# Patient Record
Sex: Male | Born: 1955 | Race: White | Hispanic: No | Marital: Married | Smoking: Never smoker
Health system: Southern US, Community
[De-identification: ages and names within clinical notes are randomized; demographics above are authoritative.]

## PROBLEM LIST (undated history)

## (undated) DIAGNOSIS — R570 Cardiogenic shock: Secondary | ICD-10-CM

## (undated) DIAGNOSIS — M2550 Pain in unspecified joint: Secondary | ICD-10-CM

## (undated) DIAGNOSIS — H538 Other visual disturbances: Secondary | ICD-10-CM

## (undated) DIAGNOSIS — C140 Malignant neoplasm of pharynx, unspecified: Secondary | ICD-10-CM

## (undated) DIAGNOSIS — E875 Hyperkalemia: Secondary | ICD-10-CM

## (undated) DIAGNOSIS — R931 Abnormal findings on diagnostic imaging of heart and coronary circulation: Secondary | ICD-10-CM

## (undated) DIAGNOSIS — R0602 Shortness of breath: Secondary | ICD-10-CM

## (undated) DIAGNOSIS — E669 Obesity, unspecified: Secondary | ICD-10-CM

## (undated) DIAGNOSIS — N289 Disorder of kidney and ureter, unspecified: Secondary | ICD-10-CM

## (undated) DIAGNOSIS — Z8673 Personal history of transient ischemic attack (TIA), and cerebral infarction without residual deficits: Secondary | ICD-10-CM

## (undated) DIAGNOSIS — R0989 Other specified symptoms and signs involving the circulatory and respiratory systems: Secondary | ICD-10-CM

## (undated) DIAGNOSIS — I509 Heart failure, unspecified: Secondary | ICD-10-CM

## (undated) DIAGNOSIS — E039 Hypothyroidism, unspecified: Secondary | ICD-10-CM

## (undated) DIAGNOSIS — E785 Hyperlipidemia, unspecified: Secondary | ICD-10-CM

## (undated) DIAGNOSIS — M2540 Effusion, unspecified joint: Secondary | ICD-10-CM

## (undated) DIAGNOSIS — I10 Essential (primary) hypertension: Secondary | ICD-10-CM

## (undated) DIAGNOSIS — I639 Cerebral infarction, unspecified: Secondary | ICD-10-CM

## (undated) HISTORY — PX: CARDIAC CATHETERIZATION: SHX172

## (undated) HISTORY — DX: Heart failure, unspecified: I50.9

## (undated) HISTORY — DX: Essential (primary) hypertension: I10

## (undated) HISTORY — DX: Other visual disturbances: H53.8

## (undated) HISTORY — DX: Abnormal findings on diagnostic imaging of heart and coronary circulation: R93.1

## (undated) HISTORY — DX: Personal history of transient ischemic attack (TIA), and cerebral infarction without residual deficits: Z86.73

## (undated) HISTORY — DX: Obesity, unspecified: E66.9

## (undated) HISTORY — DX: Cardiogenic shock: R57.0

## (undated) HISTORY — DX: Other specified symptoms and signs involving the circulatory and respiratory systems: R09.89

## (undated) HISTORY — DX: Effusion, unspecified joint: M25.40

## (undated) HISTORY — DX: Hyperkalemia: E87.5

## (undated) HISTORY — DX: Pain in unspecified joint: M25.50

## (undated) HISTORY — DX: Cerebral infarction, unspecified: I63.9

## (undated) HISTORY — DX: Disorder of kidney and ureter, unspecified: N28.9

## (undated) HISTORY — DX: Shortness of breath: R06.02

---

## 2008-08-06 ENCOUNTER — Encounter: Payer: Self-pay | Admitting: Cardiovascular Disease

## 2008-08-06 ENCOUNTER — Inpatient Hospital Stay (HOSPITAL_COMMUNITY): Admission: EM | Admit: 2008-08-06 | Discharge: 2008-08-12 | Payer: Self-pay | Admitting: Emergency Medicine

## 2008-08-06 ENCOUNTER — Ambulatory Visit: Payer: Self-pay | Admitting: Internal Medicine

## 2009-09-02 DIAGNOSIS — C140 Malignant neoplasm of pharynx, unspecified: Secondary | ICD-10-CM

## 2009-09-02 HISTORY — PX: NECK DISSECTION: SUR422

## 2009-09-02 HISTORY — DX: Malignant neoplasm of pharynx, unspecified: C14.0

## 2011-01-15 NOTE — Discharge Summary (Signed)
[name], [name] NO.:  [id number]   MEDICAL RECORD NO.:  [id number]          PATIENT TYPE:  INP   LOCATION:  4703                         FACILITY:  [facility]   PHYSICIAN:  Vesta Mixer, M.D. DATE OF BIRTH:  1956-05-02   DATE OF ADMISSION:  08/06/2008  DATE OF DISCHARGE:  08/12/2008                               DISCHARGE SUMMARY   DISCHARGE DIAGNOSES:  1. Severe systolic congestive heart failure with an ejection fraction      around 10%.  2. Cardiogenic shock.  3. Profound dehydration with hyperkalemia.  4. Transient renal insufficiency.  5. Nonsustained ventricular tachycardia.  6. Diabetes mellitus.  7. Smooth and normal coronary arteries.   DISCHARGE MEDICATIONS:  1. Byetta 10 mcg twice a day.  2. Hydrochlorothiazide 25 mg a day.  3. Lisinopril 5 mg a day.  4. Carvedilol 12.5 mg twice a day.  5. Metformin 500 mg twice a day.   DISPOSITION:  The patient will need to follow up with Dr. Darlyn Read.  We  have changed some of his diabetic medicines because of his renal failure  and dehydration.  He will follow up with Dr. Elease Hashimoto in 1-2 weeks.   The patient will go home with a LifeVest.  This will be fit, and he will  be instructed in its use prior to discharge.  He will follow up with Dr.  Darlyn Read as noted above.   HISTORY:  [name] is a 55 year old gentleman who was admitted to  the hospital as an unassigned patient.  He presented to the [facility]  Emergency Room with a systolic blood pressure of 60 and profound  hyperkalemia.  Please see dictated H and P for further details.   HOSPITAL COURSE BY PROBLEMS:  1. Hypotension.  The patient was found to be in shock.  This turned      out to be a combination of hypovolemia and a markedly reduced left      ventricular systolic function.  The patient had been recently      diagnosed with congestive heart failure and had been started on      aggressive medical therapy including Lasix.  He presented with  a      systolic blood pressure of 60.  A stat echocardiogram performed in      the ER revealed a markedly reduced left ventricular systolic      function with an ejection fraction of around 10%.  He was also      found to have a potassium of 6.0.  The patient was given volume      repletion.  He received about 3 liters in the first 6 hours which      gradually got his pressure up to around 80.  We started him on a      dopamine drip which eventually was able to get his blood pressure      up above 90 and later 100.  The dopamine was continued through the      night and was then titrated off the following day.  Following this,  the patient did not have any further episodes of hypotension after      he became well hydrated.   1. Congestive heart failure.  The patient was recently diagnosed with      congestive heart failure by Dr. Darlyn Read.  He was started on Lasix,      potassium, and ACE inhibitor.  He showed up in the emergency room      with marked hypotension and hyperkalemia.  We discontinued these      medications and hydrated him well.  When he got back to baseline      creatinine, we were able to do a heart catheterization, which      revealed minimal coronary artery irregularities.  He had a markedly      reduced left ventricular systolic function with an EF of around      10%.   The patient was started on low-dose carvedilol and we gradually added an  low-dose lisinopril.  The patient had several episodes of nonsustained  ventricular tachycardia.  Electrophysiology was consulted and they have  arranged for the patient to have a LifeVest.   We will continue with aggressive medical therapy, and we will gradually  titrate up his carvedilol and lisinopril.  We may also add  spironolactone, but we will need to be very careful since he presented  with a renal failure and hyperkalemia.  We will check his ejection  fraction in the office in approximately 3 months.  If the patient's  EF  is still low, then he will need to be referred for ICD.   1. Renal insufficiency.  The patient was admitted with a creatinine of      2.6.  This turned out to be mostly due to volume depletion.  Came      down to about 1.3 with hydration.  Upon discharge, his creatinine      is 1.41.  This will need to be followed as an outpatient.   1. Diabetes mellitus.  We continued the Byetta during the      hospitalization.  We stopped his Glucophage since he had renal      failure.  We will restart it upon discharge and this will need to      be followed by Dr. Darlyn Read.   1. Hypertension.  We have started the patient on hydrochlorothiazide      25 mg a day, lisinopril 5 mg a day, and carvedilol 12.5 mg twice a      day.  This will be followed by Dr. Elease Hashimoto.  All of his other      medical problems remained stable.      Vesta Mixer, M.D.  Electronically Signed     PJN/MEDQ  D:  08/12/2008  T:  08/13/2008  Job:  [id number]   cc:   Mechele Claude, MD

## 2011-01-15 NOTE — Cardiovascular Report (Signed)
[name], [name] NO.:  [id number]   MEDICAL RECORD NO.:  [id number]          PATIENT TYPE:  INP   LOCATION:  4703                         FACILITY:  [facility]   PHYSICIAN:  Vesta Mixer, M.D. DATE OF BIRTH:  July 27, 1956   DATE OF PROCEDURE:  08/09/2008  DATE OF DISCHARGE:                            CARDIAC CATHETERIZATION   Brian Hall is a middle-aged gentleman with a recent diagnosis of  congestive heart failure.  He was admitted to the hospital on August 06, 2008, with profound hypotension.  He was found to have renal  insufficiency.  He was hydrated.  Echocardiogram done emergently in the  emergency room reveals that he had markedly reduced left ventricular  systolic function.  After adequate hydration and recovery of his renal  function, he is referred for heart catheterization.   PROCEDURE:  Right and left heart catheterization.   The right femoral artery and right femoral vein were easily cannulated  using a modified Seldinger technique.   HEMODYNAMICS:  The RA pressure was 14/11 with a mean of 8.  His RV  pressure was 54/5 with a mean of 15.   We were unable to advance the Swan-Ganz catheter any further out into  the RV.  We tried multiple Swan wires.  I did not want to try any  stiffer catheter.   We drew saturations in the RV and did not attempt to get a wedge  pressure.   His LV pressure is 112/10 with an aortic pressure of 102/51.   His saturations, RV saturation is 61%.  His RFA saturation is 97%.  His  Fick cardiac output is 4.48 liters a minute.  His index is 1.91 liters a  minute.   Angiography of left main.  The left main is fairly smooth and normal.   The left anterior descending artery is fairly large vessel.  It is  fairly smooth.  It gives off a large diagonal vessel, which is also  normal.  There is a large ramus intermediate branch, which is smooth and  normal.   The left circumflex artery is moderate in size.  It does  not give off  any marginal vessels but gives off several posterolateral branches, all  which are normal.   The right coronary artery is large and dominant.  It is smooth and  normal in the proximal mid segments.  There is a distal area of  calcification just prior to the bifurcation into the posterolateral  branch and the posterior descending artery.  This calcification is  associated with a 20-30% stenosis, but it certainly is not flow  limiting.   The left ventriculogram was performed in a 30 RAO position.  It reveals  moderate left ventricular enlargement.  There is marked reduction of  left ventricular systolic function.  His ejection fraction is around  15%.   COMPLICATIONS:  None.   CONCLUSION:  1. Minimal coronary artery disease.  2. Severe left ventricular dysfunction.  We will start beta-blocker as      tolerated.  We will restart his ACE inhibitor as tolerated.  We  will get an EP consult.  The patient has had some nonsustained      ventricular tachycardia and certainly is at high risk of having      sudden cardiac death.      Vesta Mixer, M.D.  Electronically Signed     PJN/MEDQ  D:  08/09/2008  T:  08/10/2008  Job:  [id number]   cc:   Joycelyn Man. Darlyn Read, MD

## 2011-01-15 NOTE — H&P (Signed)
NAMEDONTEL, HARSHBERGER NO.:  [id number]   MEDICAL RECORD NO.:  [id number]          PATIENT TYPE:  INP   LOCATION:  2904                         FACILITY:  [facility]   PHYSICIAN:  Vesta Mixer, M.D. DATE OF BIRTH:  01/12/1956   DATE OF ADMISSION:  08/06/2008  DATE OF DISCHARGE:                              HISTORY & PHYSICAL   Brian Hall is a 55 year old gentleman who presented to the emergency  room with an episode of profound hypertension and weakness.  We were  asked to see him today by Dr. Preston Fleeting.   Brian Hall is a 55 year old gentleman with a longstanding history of  diabetes mellitus and hypertension.  He was diagnosed with congestive  heart failure several weeks ago.  He normally sees Dr. Darlyn Read.  The  patient presents with a 106-month history of progressive shortness of  breath.  He does describe a viral illness that started about 3 months  ago, was sick for about a month.  He denies any recent cough or cold  symptoms.  He denies any fever.  He has been short of breath for the  past several months.  He recently saw Dr. Darlyn Read and was found to have  congestive heart failure with an elevated B natriuretic peptide be of  around 1500.  He was started on Lasix 40 mg a day, potassium chloride 20  mEq a day, and lisinopril 20 mg a day.  He did well for about a week or  so, but this morning developed episodes of generalized weakness and  presyncope.  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  The patient felt quite poorly and presented to the [facility] Emergency Room for further evaluation.   The patient is quite dizzy and feels quite weak.  Immediately after he  started these medications, he felt well for several days and in fact he  went hunting several days ago and states that he could keep up with the  others without any problems.  It has been over the past couple of days  he is being progressively weaker and then today  he is the weakest in  recent history.   He denies any true syncope.  He denies any recent PND, orthopnea,  although he did have some PND and orthopnea a month ago.  He had a 30-  pound weight loss recently due to the diuresis.  He denies any chest  pain.  He has not had any episodes of angina.  He does not get any  regular exercise.   CURRENT MEDICATIONS:  1. Lasix 40 mg a day.  2. Potassium chloride 20 mEq a day.  3. Metformin 1000 mg a day.  4. Lisinopril 20 mg a day.  5. Byetta 10 mcg subcu twice a day.  6. The patient has a bottle of Zocor, but has not been taken.  He has      no known drug allergies.   PAST MEDICAL HISTORY:  1. Hypertension - longstanding.  2. History of diabetes mellitus.  3. History of obesity.  The patient is used to weigh greater than 350      pounds.  He now weighs of around 250.   He gained 30 pounds of weight that he thought was fluid.  When he  presented to see Dr. Darlyn Read, he was started on medications and loss 30  extra pounds within few days' time.   SOCIAL HISTORY:  The patient is a nonsmoker and nondrinker.  He moves  heavy equipment.   FAMILY HISTORY:  His father had a long history of hypertension and renal  failure.  His father was a long-time smoker.   REVIEW OF SYSTEMS:  The patient admits to having a viral illness 3  months ago as noted above.  He denies any recent cough or fever.  He has  had some weight loss due to starting the Lasix over the past couple of  years and he has also had a profound weight loss since last year.  He  was recently diagnosed, having congestive heart failure and he has had  some shortness of breath.  He denies any angina.  He denies any recent  cough or sputum production.  He denies any change of his appetite.  He  denies any visual changes.  He denies any neurologic changes.  Denies  any blood in his stool or blood in his urine.  He denies any previous  aches and pains, but he states it is quite achy and  weak today.  He  denies any trouble of walking.  Review of systems was reviewed and is  otherwise negative.   PHYSICAL EXAMINATION:  GENERAL:  He is a middle-aged gentleman in no  acute distress.  VITAL SIGNS:  His blood pressure is 75/60.  He has received 2 liters of  normal saline.  NECK:  He has mild elevation of his JVD.  His carotids are 2+ and  without bruits.  HEENT:  His sclerae are nonicteric.  His mucous membranes are fairly  moist now.  He is normocephalic and atraumatic.  LUNG:  Clear.  HEART:  Regular rate.  S1, S2.  I did not hear a gallop.  His PMI was  nondisplaced.  ABDOMEN:  Obese.  There is no areas of tenderness and no masses or  bruits.  EXTREMITIES:  He has no clubbing, cyanosis, or edema.  He has no rash.  His gait was not assessed.  NEUROLOGIC:  His cranial nerves are grossly intact and his motor and  sensory function are intact.   LABORATORY:  Sodium is now 138.  His potassium is 6.0.  We obtained a  potassium of 9.0 early but this was slightly hemolyzed.  His chloride is  110, CO2 is 24, BUN is 44, creatinine started at 2.3 and has down to 2.1  after hydration.  His glucose is 206.  His B natriuretic peptide is 437.  His troponin is less than 0.05.  His white blood cell count IS 13.7 with  a hemoglobin of 15.5.   His chest x-ray reveals moderate-to-marked cardiomegaly.  There is no  significant evidence of pulmonary edema.   His echocardiogram which was performed emergently in the ER reveals  markedly reduced left ventricular systolic function.  His ejection  fraction is around 15%.  He has a trivial pericardial effusion which is  only just a little bit more than what would be physiologic.  He has  bilateral atrial enlargement.   IMPRESSION AND PLAN:  1. Hypertension.  The patient is markedly hypotensive.  I  think he is      very dehydrated.  He spoke with his medical doctor and his renal      insufficiency is new.  I suspect that it is because of  the Lasix      and ACE inhibitor.  He has already received 2-1/2 liters of saline      and his blood pressure is up about 75.  We will continue very slow      and gentle hydration since his left ventricle function is so poor.      Once if we get him well hydrated, we may need to started him on      some low-dose dopamine, which we initiated in the ER.  We will      continue to watch him in the intensive care unit.  2. Hyperkalemia.  The patient's potassium is 6.0.  He is on a      potassium supplement and has recently been placed on lisinopril.      We have given him some Kayexalate and we will check his BNP in a      couple of hours.  I suspect that this will resolve fairly easily.  3. Diabetes mellitus.  His glucose levels are fairly elevated today.      We will have to hold his Glucophage since he has got renal      dysfunction.  We will check his Accu-Cheks and give him a sliding      scale as needed.  4. Renal insufficiency.  His creatinine is now 2.1.  This is brand new      problem.  I suspect it is due to volume depletion.  Hopefully, this      will improve significantly.   We will anticipate doing a heart catheterization later in the week after  he stabilize some.  1. Hyperlipidemia.  The patient has a history of hyperlipidemia.  We      will follow this with some labs in the morning.      Vesta Mixer, M.D.  Electronically Signed     PJN/MEDQ  D:  08/06/2008  T:  08/07/2008  Job:  [id number]   cc:   Joycelyn Man. Stacks, M.D.

## 2011-01-15 NOTE — Consult Note (Signed)
Brian Hall, ALBANESE NO.:  [id number]   MEDICAL RECORD NO.:  [id number]          PATIENT TYPE:  INP   LOCATION:  4703                         FACILITY:  [facility]   PHYSICIAN:  Doylene Canning. Ladona Ridgel, MD    DATE OF BIRTH:  09/11/1955   DATE OF CONSULTATION:  08/09/2008  DATE OF DISCHARGE:                                 CONSULTATION   REQUESTING PHYSICIAN:  Vesta Mixer, MD   INDICATIONS FOR CONSULTATION:  Evaluation of nonsustained VT in a  patient with a nonischemic cardiomyopathy and congestive heart failure.   HISTORY OF PRESENT ILLNESS:  The patient is a very pleasant middle-aged  male with a history of hypertension and diabetes.  He has acute renal  insufficiency.  He was admitted to the hospital with acute congestive  heart failure and some dehydration.  His BNP was in the 1500 range.  The  patient actually was admitted with weakness and presyncope.  His heart  failure symptoms had occurred a week or two before his symptoms.  In  reflection, he notes that in fact for the last 4 months, he has felt  poorly, had less energy, more shortness of breath, he would have to stop  and rest, he would wake up at night and be short of breath and after sit  up by the side of the bed.  He tried to tug off these symptoms, as he  describes for some time and he has presented to the hospital with  worsening heart failure symptoms.  He has now been treated with IV  diuresis and has undergone catheterization, which demonstrates no  obstructive coronary artery disease.  He has severe LV dysfunction.  The  EF is 15%.  His baseline QRS in the heart failure was 117 milliseconds.   PHYSICAL EXAMINATION:  GENERAL:  He is a pleasant, obese, middle-aged  man in no acute distress.  VITAL SIGNS:  Blood pressure was 105/70, pulse was 85 and regular,  respirations were 18.  HEENT:  Normocephalic, atraumatic.  Pupils equal and round.  Oropharynx  is moist.  Sclerae were anicteric.  NECK:  A  7- to 8-cm jugular venous distention.  There is no thyromegaly.  Trachea is midline.  Carotids are 2+ and symmetric.  LUNGS:  Clear bilaterally to auscultation.  No wheezes, rales, or  rhonchi are present except for basilar rales bilaterally.  CARDIOVASCULAR:  Regular rate and rhythm.  Normal S1 and S2.  ABDOMEN:  Soft, nontender, and nondistended.  There is no organomegaly.  Bowel sounds are present.  There is no rebound or guarding.  EXTREMITIES:  No cyanosis, clubbing, or edema.  The pulses were 2+ and  symmetric.  NEUROLOGIC:  Alert and oriented x3.  Cranial nerves are intact.  Strength is 5/5 and symmetric.   IMPRESSION:  1. Acute nonischemic cardiomyopathy.  2. Congestive heart failure class II, at best may be worse.  3. Hypertension.  4. Obesity.  5. Dyslipidemia.   DISCUSSION:  Mr. Virgin is stable, and his catheterization  demonstrates no obstructive coronary artery disease.  He has not been on  maximal  medical therapy with regard to heart failure medications, and  for this reason, I have recommended that he do so.  With regard to  defibrillator insertion, the patient is a candidate for prophylactic ICD  insertion; however, he has not been treated and diagnosed heart failure  for 3 months, and at this point, I will consider a LifeVest for the  patient.  We will also need to up titrate his medical therapy.  After 3  months if his LV function still remains down, prophylactic defibrillator  insertion will be recommended.      Doylene Canning. Ladona Ridgel, MD  Electronically Signed     GWT/MEDQ  D:  08/09/2008  T:  08/10/2008  Job:  [id number]

## 2011-06-07 LAB — DIFFERENTIAL
Lymphs Abs: 1.5 10*3/uL (ref 0.7–4.0)
Monocytes Absolute: 0.5 10*3/uL (ref 0.1–1.0)
Monocytes Relative: 4 % (ref 3–12)
Neutro Abs: 11.6 10*3/uL — ABNORMAL HIGH (ref 1.7–7.7)
Neutrophils Relative %: 85 % — ABNORMAL HIGH (ref 43–77)

## 2011-06-07 LAB — B-NATRIURETIC PEPTIDE (CONVERTED LAB)
Pro B Natriuretic peptide (BNP): 1215 pg/mL — ABNORMAL HIGH (ref 0.0–100.0)
Pro B Natriuretic peptide (BNP): 1334 pg/mL — ABNORMAL HIGH (ref 0.0–100.0)
Pro B Natriuretic peptide (BNP): 1832 pg/mL — ABNORMAL HIGH (ref 0.0–100.0)
Pro B Natriuretic peptide (BNP): 785 pg/mL — ABNORMAL HIGH (ref 0.0–100.0)

## 2011-06-07 LAB — BASIC METABOLIC PANEL
BUN: 23 mg/dL (ref 6–23)
BUN: 27 mg/dL — ABNORMAL HIGH (ref 6–23)
CO2: 21 mEq/L (ref 19–32)
CO2: 29 mEq/L (ref 19–32)
Calcium: 8.2 mg/dL — ABNORMAL LOW (ref 8.4–10.5)
Calcium: 8.6 mg/dL (ref 8.4–10.5)
Calcium: 8.7 mg/dL (ref 8.4–10.5)
Calcium: 8.9 mg/dL (ref 8.4–10.5)
Chloride: 102 mEq/L (ref 96–112)
Chloride: 103 mEq/L (ref 96–112)
Chloride: 107 mEq/L (ref 96–112)
Chloride: 108 mEq/L (ref 96–112)
Creatinine, Ser: 1.04 mg/dL (ref 0.4–1.5)
Creatinine, Ser: 1.16 mg/dL (ref 0.4–1.5)
Creatinine, Ser: 1.3 mg/dL (ref 0.4–1.5)
Creatinine, Ser: 1.41 mg/dL (ref 0.4–1.5)
Creatinine, Ser: 1.71 mg/dL — ABNORMAL HIGH (ref 0.4–1.5)
GFR calc Af Amer: 43 mL/min — ABNORMAL LOW (ref >60)
GFR calc Af Amer: 52 mL/min — ABNORMAL LOW (ref >60)
GFR calc Af Amer: >60 mL/min (ref >60)
GFR calc Af Amer: >60 mL/min (ref >60)
GFR calc Af Amer: >60 mL/min (ref >60)
GFR calc Af Amer: >60 mL/min (ref >60)
GFR calc non Af Amer: 58 mL/min — ABNORMAL LOW (ref >60)
GFR calc non Af Amer: >60 mL/min (ref >60)
GFR calc non Af Amer: >60 mL/min (ref >60)
Glucose, Bld: 101 mg/dL — ABNORMAL HIGH (ref 70–99)
Glucose, Bld: 118 mg/dL — ABNORMAL HIGH (ref 70–99)
Potassium: 4.5 mEq/L (ref 3.5–5.1)
Potassium: 4.8 mEq/L (ref 3.5–5.1)
Potassium: 5.8 mEq/L — ABNORMAL HIGH (ref 3.5–5.1)
Sodium: 131 mEq/L — ABNORMAL LOW (ref 135–145)
Sodium: 138 mEq/L (ref 135–145)
Sodium: 139 mEq/L (ref 135–145)
Sodium: 141 mEq/L (ref 135–145)

## 2011-06-07 LAB — POCT I-STAT 3, VENOUS BLOOD GAS (G3P V)
Acid-Base Excess: 1 mmol/L (ref 0.0–2.0)
Bicarbonate: 25.2 mEq/L — ABNORMAL HIGH (ref 20.0–24.0)
TCO2: 26 mmol/L (ref 0–100)

## 2011-06-07 LAB — PROTIME-INR
INR: 1 (ref 0.00–1.49)
Prothrombin Time: 13.8 seconds (ref 11.6–15.2)

## 2011-06-07 LAB — CARDIAC PANEL(CRET KIN+CKTOT+MB+TROPI)
CK, MB: 3.1 ng/mL (ref 0.3–4.0)
Total CK: 69 U/L (ref 7–232)
Troponin I: 1.15 ng/mL (ref 0.00–0.06)
Troponin I: 1.15 ng/mL (ref 0.00–0.06)

## 2011-06-07 LAB — LIPID PANEL
Triglycerides: 160 mg/dL — ABNORMAL HIGH (ref <150)
VLDL: 32 mg/dL (ref 0–40)

## 2011-06-07 LAB — POCT CARDIAC MARKERS
CKMB, poc: 2.1 ng/mL (ref 1.0–8.0)
Myoglobin, poc: 186 ng/mL (ref 12–200)
Troponin i, poc: <0.05 ng/mL (ref 0.00–0.09)

## 2011-06-07 LAB — GLUCOSE, CAPILLARY
Glucose-Capillary: 112 mg/dL — ABNORMAL HIGH (ref 70–99)
Glucose-Capillary: 114 mg/dL — ABNORMAL HIGH (ref 70–99)
Glucose-Capillary: 115 mg/dL — ABNORMAL HIGH (ref 70–99)
Glucose-Capillary: 122 mg/dL — ABNORMAL HIGH (ref 70–99)
Glucose-Capillary: 123 mg/dL — ABNORMAL HIGH (ref 70–99)
Glucose-Capillary: 129 mg/dL — ABNORMAL HIGH (ref 70–99)
Glucose-Capillary: 131 mg/dL — ABNORMAL HIGH (ref 70–99)
Glucose-Capillary: 133 mg/dL — ABNORMAL HIGH (ref 70–99)
Glucose-Capillary: 134 mg/dL — ABNORMAL HIGH (ref 70–99)
Glucose-Capillary: 174 mg/dL — ABNORMAL HIGH (ref 70–99)
Glucose-Capillary: 249 mg/dL — ABNORMAL HIGH (ref 70–99)

## 2011-06-07 LAB — CBC
HCT: 43.7 % (ref 39.0–52.0)
Hemoglobin: 15.8 g/dL (ref 13.0–17.0)
MCV: 89.7 fL (ref 78.0–100.0)
Platelets: 160 10*3/uL (ref 150–400)
RBC: 4.83 MIL/uL (ref 4.22–5.81)
RBC: 5.42 MIL/uL (ref 4.22–5.81)
WBC: 13.7 10*3/uL — ABNORMAL HIGH (ref 4.0–10.5)
WBC: 8.6 10*3/uL (ref 4.0–10.5)

## 2011-06-07 LAB — POCT I-STAT 3, ART BLOOD GAS (G3+)
Acid-base deficit: 1 mmol/L (ref 0.0–2.0)
O2 Saturation: 97 %

## 2011-06-07 LAB — CK TOTAL AND CKMB (NOT AT ARMC): Total CK: 193 U/L (ref 7–232)

## 2011-06-07 LAB — POCT I-STAT, CHEM 8
Calcium, Ion: 1.01 mmol/L — ABNORMAL LOW (ref 1.12–1.32)
HCT: 49 % (ref 39.0–52.0)
Hemoglobin: 16.7 g/dL (ref 13.0–17.0)
Sodium: 138 mEq/L (ref 135–145)
TCO2: 24 mmol/L (ref 0–100)

## 2011-06-07 LAB — LACTIC ACID, PLASMA: Lactic Acid, Venous: 4.7 mmol/L — ABNORMAL HIGH (ref 0.5–2.2)

## 2011-06-07 LAB — TROPONIN I: Troponin I: 1.59 ng/mL (ref 0.00–0.06)

## 2011-06-07 LAB — POTASSIUM: Potassium: 5.8 mEq/L — ABNORMAL HIGH (ref 3.5–5.1)

## 2014-09-12 ENCOUNTER — Encounter (HOSPITAL_COMMUNITY): Payer: Self-pay

## 2014-09-12 ENCOUNTER — Inpatient Hospital Stay (HOSPITAL_COMMUNITY)
Admission: EM | Admit: 2014-09-12 | Discharge: 2014-09-16 | DRG: 065 | Disposition: A | Discharge status: Rehabilitation Facility | Payer: 59 | Attending: Internal Medicine | Admitting: Internal Medicine

## 2014-09-12 ENCOUNTER — Emergency Department (HOSPITAL_COMMUNITY): Payer: 59

## 2014-09-12 DIAGNOSIS — Z7982 Long term (current) use of aspirin: Secondary | ICD-10-CM

## 2014-09-12 DIAGNOSIS — N179 Acute kidney failure, unspecified: Secondary | ICD-10-CM | POA: Diagnosis present

## 2014-09-12 DIAGNOSIS — Z85819 Personal history of malignant neoplasm of unspecified site of lip, oral cavity, and pharynx: Secondary | ICD-10-CM

## 2014-09-12 DIAGNOSIS — E1142 Type 2 diabetes mellitus with diabetic polyneuropathy: Secondary | ICD-10-CM

## 2014-09-12 DIAGNOSIS — G8194 Hemiplegia, unspecified affecting left nondominant side: Secondary | ICD-10-CM | POA: Diagnosis present

## 2014-09-12 DIAGNOSIS — E785 Hyperlipidemia, unspecified: Secondary | ICD-10-CM | POA: Diagnosis present

## 2014-09-12 DIAGNOSIS — I5042 Chronic combined systolic (congestive) and diastolic (congestive) heart failure: Secondary | ICD-10-CM | POA: Diagnosis present

## 2014-09-12 DIAGNOSIS — I63511 Cerebral infarction due to unspecified occlusion or stenosis of right middle cerebral artery: Secondary | ICD-10-CM | POA: Diagnosis present

## 2014-09-12 DIAGNOSIS — E039 Hypothyroidism, unspecified: Secondary | ICD-10-CM | POA: Diagnosis present

## 2014-09-12 DIAGNOSIS — I6330 Cerebral infarction due to thrombosis of unspecified cerebral artery: Secondary | ICD-10-CM

## 2014-09-12 DIAGNOSIS — I6521 Occlusion and stenosis of right carotid artery: Secondary | ICD-10-CM | POA: Diagnosis present

## 2014-09-12 DIAGNOSIS — E1165 Type 2 diabetes mellitus with hyperglycemia: Secondary | ICD-10-CM | POA: Diagnosis present

## 2014-09-12 DIAGNOSIS — E669 Obesity, unspecified: Secondary | ICD-10-CM | POA: Diagnosis present

## 2014-09-12 DIAGNOSIS — I639 Cerebral infarction, unspecified: Secondary | ICD-10-CM | POA: Diagnosis present

## 2014-09-12 DIAGNOSIS — I10 Essential (primary) hypertension: Secondary | ICD-10-CM | POA: Diagnosis present

## 2014-09-12 DIAGNOSIS — I63319 Cerebral infarction due to thrombosis of unspecified middle cerebral artery: Secondary | ICD-10-CM

## 2014-09-12 DIAGNOSIS — Z923 Personal history of irradiation: Secondary | ICD-10-CM | POA: Diagnosis not present

## 2014-09-12 DIAGNOSIS — Z79899 Other long term (current) drug therapy: Secondary | ICD-10-CM

## 2014-09-12 DIAGNOSIS — Z8589 Personal history of malignant neoplasm of other organs and systems: Secondary | ICD-10-CM

## 2014-09-12 DIAGNOSIS — I429 Cardiomyopathy, unspecified: Secondary | ICD-10-CM | POA: Diagnosis present

## 2014-09-12 DIAGNOSIS — D72829 Elevated white blood cell count, unspecified: Secondary | ICD-10-CM

## 2014-09-12 DIAGNOSIS — E119 Type 2 diabetes mellitus without complications: Secondary | ICD-10-CM

## 2014-09-12 DIAGNOSIS — Z6834 Body mass index (BMI) 34.0-34.9, adult: Secondary | ICD-10-CM | POA: Diagnosis not present

## 2014-09-12 HISTORY — DX: Cerebral infarction, unspecified: I63.9

## 2014-09-12 HISTORY — DX: Hypothyroidism, unspecified: E03.9

## 2014-09-12 HISTORY — DX: Malignant neoplasm of pharynx, unspecified: C14.0

## 2014-09-12 HISTORY — DX: Hyperlipidemia, unspecified: E78.5

## 2014-09-12 LAB — LIPID PANEL
CHOL/HDL RATIO: 5.4 ratio
CHOLESTEROL: 174 mg/dL (ref 0–200)
HDL: 32 mg/dL — AB (ref >39)
LDL Cholesterol: 88 mg/dL (ref 0–99)
TRIGLYCERIDES: 271 mg/dL — AB (ref <150)
VLDL: 54 mg/dL — AB (ref 0–40)

## 2014-09-12 LAB — URINALYSIS, ROUTINE W REFLEX MICROSCOPIC
Bilirubin Urine: NEGATIVE
Glucose, UA: 100 mg/dL — AB
HGB URINE DIPSTICK: NEGATIVE
KETONES UR: NEGATIVE mg/dL
Leukocytes, UA: NEGATIVE
Nitrite: NEGATIVE
Protein, ur: 100 mg/dL — AB
Specific Gravity, Urine: 1.021 (ref 1.005–1.030)
UROBILINOGEN UA: 0.2 mg/dL (ref 0.0–1.0)
pH: 5 (ref 5.0–8.0)

## 2014-09-12 LAB — DIFFERENTIAL
BASOS PCT: 0 % (ref 0–1)
Basophils Absolute: 0 10*3/uL (ref 0.0–0.1)
Eosinophils Absolute: 0.1 10*3/uL (ref 0.0–0.7)
Eosinophils Relative: 1 % (ref 0–5)
LYMPHS PCT: 15 % (ref 12–46)
Lymphs Abs: 1.9 10*3/uL (ref 0.7–4.0)
MONO ABS: 0.8 10*3/uL (ref 0.1–1.0)
MONOS PCT: 6 % (ref 3–12)
Neutro Abs: 9.6 10*3/uL — ABNORMAL HIGH (ref 1.7–7.7)
Neutrophils Relative %: 78 % — ABNORMAL HIGH (ref 43–77)

## 2014-09-12 LAB — COMPREHENSIVE METABOLIC PANEL
ALK PHOS: 55 U/L (ref 39–117)
ALT: 26 U/L (ref 0–53)
AST: 31 U/L (ref 0–37)
Albumin: 4.1 g/dL (ref 3.5–5.2)
Anion gap: 5 (ref 5–15)
BUN: 28 mg/dL — ABNORMAL HIGH (ref 6–23)
CHLORIDE: 109 meq/L (ref 96–112)
CO2: 23 mmol/L (ref 19–32)
CREATININE: 1.31 mg/dL (ref 0.50–1.35)
Calcium: 10 mg/dL (ref 8.4–10.5)
GFR calc non Af Amer: 59 mL/min — ABNORMAL LOW (ref >90)
GFR, EST AFRICAN AMERICAN: 69 mL/min — AB (ref >90)
Glucose, Bld: 196 mg/dL — ABNORMAL HIGH (ref 70–99)
Potassium: 4.5 mmol/L (ref 3.5–5.1)
SODIUM: 137 mmol/L (ref 135–145)
Total Bilirubin: 1 mg/dL (ref 0.3–1.2)
Total Protein: 7.4 g/dL (ref 6.0–8.3)

## 2014-09-12 LAB — I-STAT CHEM 8, ED
BUN: 35 mg/dL — ABNORMAL HIGH (ref 6–23)
CHLORIDE: 107 meq/L (ref 96–112)
CREATININE: 1.2 mg/dL (ref 0.50–1.35)
Calcium, Ion: 1.28 mmol/L — ABNORMAL HIGH (ref 1.12–1.23)
Glucose, Bld: 198 mg/dL — ABNORMAL HIGH (ref 70–99)
HEMATOCRIT: 49 % (ref 39.0–52.0)
Hemoglobin: 16.7 g/dL (ref 13.0–17.0)
POTASSIUM: 4.6 mmol/L (ref 3.5–5.1)
Sodium: 139 mmol/L (ref 135–145)
TCO2: 20 mmol/L (ref 0–100)

## 2014-09-12 LAB — URINE MICROSCOPIC-ADD ON

## 2014-09-12 LAB — CBC
HEMATOCRIT: 44.4 % (ref 39.0–52.0)
Hemoglobin: 15.5 g/dL (ref 13.0–17.0)
MCH: 31.6 pg (ref 26.0–34.0)
MCHC: 34.9 g/dL (ref 30.0–36.0)
MCV: 90.4 fL (ref 78.0–100.0)
Platelets: 167 10*3/uL (ref 150–400)
RBC: 4.91 MIL/uL (ref 4.22–5.81)
RDW: 13 % (ref 11.5–15.5)
WBC: 12.4 10*3/uL — AB (ref 4.0–10.5)

## 2014-09-12 LAB — GLUCOSE, CAPILLARY: GLUCOSE-CAPILLARY: 190 mg/dL — AB (ref 70–99)

## 2014-09-12 LAB — PROTIME-INR
INR: 0.95 (ref 0.00–1.49)
Prothrombin Time: 12.8 seconds (ref 11.6–15.2)

## 2014-09-12 LAB — APTT: APTT: 31 s (ref 24–37)

## 2014-09-12 LAB — I-STAT TROPONIN, ED: Troponin i, poc: 0 ng/mL (ref 0.00–0.08)

## 2014-09-12 MED ORDER — ACETAMINOPHEN 325 MG PO TABS: 650.0000 mg | ORAL_TABLET | Freq: Four times a day (QID) | ORAL | Status: DC | PRN

## 2014-09-12 MED ORDER — STROKE: EARLY STAGES OF RECOVERY BOOK
Freq: Once | Status: AC
  Administered 2014-09-12: 18:00:00

## 2014-09-12 MED ORDER — INSULIN ASPART 100 UNIT/ML ~~LOC~~ SOLN
0.0000 [IU] | Freq: Three times a day (TID) | SUBCUTANEOUS | Status: DC
  Administered 2014-09-13 (×3): 2 [IU] via SUBCUTANEOUS
  Administered 2014-09-14: 3 [IU] via SUBCUTANEOUS
  Administered 2014-09-14: 2 [IU] via SUBCUTANEOUS

## 2014-09-12 MED ORDER — PRAVASTATIN SODIUM 40 MG PO TABS
40.0000 mg | ORAL_TABLET | Freq: Every day | ORAL | Status: DC
  Administered 2014-09-13 – 2014-09-14 (×2): 40 mg via ORAL
  Filled 2014-09-12 (×2): qty 1

## 2014-09-12 MED ORDER — ACETAMINOPHEN 650 MG RE SUPP: 650.0000 mg | Freq: Four times a day (QID) | RECTAL | Status: DC | PRN

## 2014-09-12 MED ORDER — HYDRALAZINE HCL 20 MG/ML IJ SOLN: 5.0000 mg | Freq: Once | INTRAMUSCULAR | Status: DC

## 2014-09-12 MED ORDER — ASPIRIN 325 MG PO TABS
325.0000 mg | ORAL_TABLET | Freq: Once | ORAL | Status: AC
  Administered 2014-09-12: 325 mg via ORAL
  Filled 2014-09-12: qty 1

## 2014-09-12 MED ORDER — HEPARIN SODIUM (PORCINE) 5000 UNIT/ML IJ SOLN
5000.0000 [IU] | Freq: Three times a day (TID) | INTRAMUSCULAR | Status: DC
  Administered 2014-09-12 – 2014-09-16 (×12): 5000 [IU] via SUBCUTANEOUS
  Filled 2014-09-12 (×12): qty 1

## 2014-09-12 MED ORDER — PANTOPRAZOLE SODIUM 40 MG PO TBEC
80.0000 mg | DELAYED_RELEASE_TABLET | Freq: Every day | ORAL | Status: DC
  Administered 2014-09-13 – 2014-09-16 (×4): 80 mg via ORAL
  Filled 2014-09-12 (×4): qty 2

## 2014-09-12 MED ORDER — LEVOTHYROXINE SODIUM 88 MCG PO TABS
88.0000 ug | ORAL_TABLET | Freq: Every day | ORAL | Status: DC
  Administered 2014-09-13 – 2014-09-16 (×4): 88 ug via ORAL
  Filled 2014-09-12 (×4): qty 1

## 2014-09-12 NOTE — ED Provider Notes (Signed)
CSN: [id number]     Arrival date & time 09/12/14  1031 History   First MD Initiated Contact with Patient 09/12/14 1032     Chief Complaint  Patient presents with  . Stroke Symptoms     (Consider location/radiation/quality/duration/timing/severity/associated sxs/prior Treatment) The history is provided by the patient and the spouse.    Pt with hx HTN, DM, obesity, CHF p/w slurred speech and left sided weakness that began last night around 8pm.  States he was walking around the house and felt a sudden generalized weakness, causing his legs to go out,  Notes his left was weaker than right.  Had continued symptoms from this point but notes his speech is somewhat improved though not back to normal.  [address] 4 times because he was unable to hold his own weight.  Wife notes slurred speech and significant left sided weakness when trying to help him.  He had one episode of urinary incontinence while straining to get off the ground.   Denies fevers, chest pain, SOB, dizziness, lightheadedness, difficulty thinking of words or understanding others, visual changes.  Is taking all of his medications.  PCP Dr Livia Snellen in [place], [place].  [facility]    Past Medical History  Diagnosis Date  . Hypertension   . Diabetes mellitus   . Obesity   . CHF (congestive heart failure)   . SOB (shortness of breath)   . Hyperkalemia   . Renal insufficiency   . Cardiogenic shock   . Cardiac LV ejection fraction 10-20%    Past Surgical History  Procedure Laterality Date  . Cardiac catheterization     No family history on file. History  Substance Use Topics  . Smoking status: Not on file  . Smokeless tobacco: Not on file  . Alcohol Use: No    Review of Systems  All other systems reviewed and are negative.     Allergies  Review of patient's allergies indicates no known allergies.  Home Medications   Prior to Admission medications   Medication Sig Start Date End Date Taking? Authorizing Provider   carvedilol (COREG) 25 MG tablet Take 25 mg by mouth 2 (two) times daily with a meal.    Historical Provider, MD  exenatide (BYETTA) 10 MCG/0.04ML SOLN Inject into the skin 2 (two) times daily with a meal.    Historical Provider, MD  hydrochlorothiazide (HYDRODIURIL) 25 MG tablet Take 25 mg by mouth daily.    Historical Provider, MD  lisinopril (PRINIVIL,ZESTRIL) 10 MG tablet Take 10 mg by mouth daily.    Historical Provider, MD  metFORMIN (GLUCOPHAGE) 1000 MG tablet Take 1,000 mg by mouth 2 (two) times daily with a meal.    Historical Provider, MD   BP 152/103 mmHg  Pulse 84  Temp(Src) 97.6 F (36.4 C) (Oral)  Resp 20  Ht 5\' 11"  (1.803 m)  Wt 250 lb (113.399 kg)  BMI 34.88 kg/m2  SpO2 95% Physical Exam  Constitutional: He appears well-developed and well-nourished. No distress.  HENT:  Head: Normocephalic and atraumatic.  Neck: Neck supple.  Cardiovascular: Normal rate and regular rhythm.   Pulmonary/Chest: Effort normal and breath sounds normal. No respiratory distress. He has no wheezes. He has no rales.  Abdominal: Soft. He exhibits no distension. There is no tenderness. There is no rebound and no guarding.  obese  Neurological: He is alert. He exhibits normal muscle tone. GCS eye subscore is 4. GCS verbal subscore is 5. GCS motor subscore is 6.  CN II-XII intact.  Significantly decreased strength in LUE with left sided pronator drift.  Left leg with slight decrease in strength.  Sensation intact but decreased on left extremities.  Distal pulses intact.  Finger to nose, RAMs, and heel to shin normal on right (weakness on left makes testing difficult).  Gait testing deferred due to extreme left sided weakness.  +slurred speech   Skin: He is not diaphoretic.  Psychiatric: He has a normal mood and affect. His behavior is normal.  Nursing note and vitals reviewed.   ED Course  Procedures (including critical care time) Labs Review Labs Reviewed  CBC - Abnormal; Notable for the  following:    WBC 12.4 (*)    All other components within normal limits  DIFFERENTIAL - Abnormal; Notable for the following:    Neutrophils Relative % 78 (*)    Neutro Abs 9.6 (*)    All other components within normal limits  COMPREHENSIVE METABOLIC PANEL - Abnormal; Notable for the following:    Glucose, Bld 196 (*)    BUN 28 (*)    GFR calc non Af Amer 59 (*)    GFR calc Af Amer 69 (*)    All other components within normal limits  I-STAT CHEM 8, ED - Abnormal; Notable for the following:    BUN 35 (*)    Glucose, Bld 198 (*)    Calcium, Ion 1.28 (*)    All other components within normal limits  PROTIME-INR  APTT  URINALYSIS, ROUTINE W REFLEX MICROSCOPIC  I-STAT TROPOININ, ED    Imaging Review Ct Head (brain) Wo Contrast  09/12/2014   CLINICAL DATA:  Left arm weakness, onset at 8 a.m. today. History of throat cancer.  EXAM: CT HEAD WITHOUT CONTRAST  TECHNIQUE: Contiguous axial images were obtained from the base of the skull through the vertex without intravenous contrast.  COMPARISON:  None.  FINDINGS: There is immediate low-attenuation in the right middle cerebral artery territory, involving predominantly the right frontal and right parietal lobes. Remote infarcts are seen in the left occipital lobe and left cerebellar hemisphere. No evidence of acute hemorrhage, mass lesion, mass effect or hydrocephalus.  No air-fluid levels in the visualized portions of the paranasal sinuses or mastoid air cells.  IMPRESSION: 1. 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 2. Remote left occipital and left cerebellar infarcts.   Electronically Signed   By: Lorin Picket M.D.   On: 09/12/2014 12:10     EKG Interpretation   Date/Time:  Monday September 12 2014 10:32:37 EST Ventricular Rate:  89 PR Interval:  190 QRS Duration: 113 QT Interval:  355 QTC Calculation: 432 R Axis:   -31 Text Interpretation:  Age not entered, assumed to be  59 years old for  purpose of ECG interpretation  Sinus rhythm Paired ventricular premature  complexes Borderline IVCD with LAD Low voltage, extremity leads Abnormal  ekg Confirmed by Audie Pinto  MD, ROBERT ([id number]) on 09/12/2014 10:58:42 AM       12:36 PM Dr Nicole Kindred, neurohospitalist, to consult.    MDM   Final diagnoses:  Stroke   Pt with hx HTN, DM, obesity, CHF p/w left sided weakness and slurred speech that began around 8pm yesterday.  CT shows acute as well as remote strokes.  Pt has never had stroke workup previously and was not previously known to have had stroke.  He has significant weakness and will likely need rehab for PT/OT.  Labs significant for mild leukocytosis, renal insufficiency, hyperglycemia.  UA without infection.  Pt admitted to [facility] Internal Medicine teaching service, neurology to consult.      Clayton Bibles, PA-C 09/12/14 [place], MD 09/13/14 [phone]

## 2014-09-12 NOTE — Progress Notes (Signed)
Pt received from ED alert, verbal with no noted distress. Wife at bedside with personal belongings. Pt stable, neuro intact was able to follow commands. Assist with positioning while in bed. Safety measures in place. Left sided weakness. Pt educated and oriented to unit and room. HOB elevated. Call bell within reach. Will continue to monitor. Received report from nurse, Eme.

## 2014-09-12 NOTE — H&P (Signed)
Date: 09/12/2014               Patient Name:  Brian Hall MRN: [id number]  DOB: 11/23/1955 Age / Sex: 59 y.o., male   PCP: No primary care provider on file.         Medical Service: Internal Medicine Teaching Service         Attending Physician: Dr. Dot Lanes, MD    First Contact: Brian Hall MS4 Pager: [phone]  Second Contact: Dr. Denton Brick Pager: [phone]       After Hours (After 5p/  First Contact Pager: [phone]  weekends / holidays): Second Contact Pager: [phone]   Chief Complaint: Left arm weakness and slurred speech- 1 day duration  History of Present Illness: 52 Y O M of HTN, DM, Obesity, presented today with c/o of weakness and slurred speech that started least night before he went to bed. Brian Hall also endorses looking his balance and equilibrium when he wanted to go to the bathroom, so his wife had to help him. Wife also noticed that a part of his face was droopy, and reports he might have had some problem with urinary incontinence which is new. Pt notes that symptoms were the same as when he went to bed. He did not come to the Ed yesterday, for no real reason- wife says pt is sturborn and reported that he said will be fine.  Pt denies changes in his vision, but thinks he feels pain behind his eyes. Pt denies difficulty swallowing, he are some crackers and had no coughing, choking or difficulty.  Pt denies ever having similar symptoms in the past. He takes an aspirin every day, does not miss doses, follow sup regularly with his doctor, and takes his BP and Diabetes medications regularly. Pt denies seizures. He has had "throat cancer" in the past- received radiation, chemo and Surgery, and was told in 2014 that there was no sign of recurrence.   Meds: No current facility-administered medications for this encounter.   Current Outpatient Prescriptions  Medication Sig Dispense Refill  . aspirin EC 81 MG tablet Take 81 mg by mouth daily.    . carvedilol (COREG) 12.5 MG tablet  Take 12.5 mg by mouth 2 (two) times daily.  0  . cholecalciferol (VITAMIN D) 400 UNITS TABS tablet Take 400 Units by mouth daily.    Marland Kitchen JANUVIA 100 MG tablet Take 100 mg by mouth daily.  0  . levothyroxine (SYNTHROID, LEVOTHROID) 88 MCG tablet Take 0.88 mcg by mouth daily.    Marland Kitchen lisinopril (PRINIVIL,ZESTRIL) 40 MG tablet Take 40 mg by mouth daily.    . metFORMIN (GLUCOPHAGE-XR) 750 MG 24 hr tablet Take 750 mg by mouth 2 (two) times daily.    . Multiple Vitamins-Minerals (MULTIVITAMIN WITH MINERALS) tablet Take 1 tablet by mouth daily.    Marland Kitchen omeprazole (PRILOSEC) 40 MG capsule Take 40 mg by mouth daily.    . pravastatin (PRAVACHOL) 40 MG tablet Take 40 mg by mouth daily.      Allergies: Allergies as of 09/12/2014  . (No Known Allergies)   Past Medical History  Diagnosis Date  . Hypertension   . Diabetes mellitus   . Obesity   . CHF (congestive heart failure)   . SOB (shortness of breath)   . Hyperkalemia   . Renal insufficiency   . Cardiogenic shock   . Cardiac LV ejection fraction 10-20%   . Hyperlipemia   . Hypothyroidism   . Throat cancer 2011  s/p neck dissection, radiation, chemo   Past Surgical History  Procedure Laterality Date  . Cardiac catheterization    . Neck dissection Right 2011    s/p resection of throat cancer with multiple nodes removed   Family History  Problem Relation Age of Onset  . Hypertension Mother   . Hypertension Father    History   Social History  . Marital Status: Legally Separated    Spouse Name: N/A    Number of Children: N/A  . Years of Education: N/A   Occupational History  . truck driver    Social History Main Topics  . Smoking status: Never Smoker   . Smokeless tobacco: Not on file  . Alcohol Use: No  . Drug Use: No  . Sexual Activity: Not on file   Other Topics Concern  . Not on file   Social History Narrative    Review of Systems: CONSTITUTIONAL- No Fever, weightloss, night sweat or change in appetite. SKIN- No  Rash, colour changes or itching. HEAD- No Headache or dizziness. Mouth/throat- No Sorethroat, dentures, or bleeding gums. RESPIRATORY- No Cough or SOB. CARDIAC- No Palpitations, DOE, PND or chest pain. GI- No nausea, vomiting, diarrhoea, constipation, abd pain.  Physical Exam: Blood pressure 147/98, pulse 65, temperature 97.8 F (36.6 C), temperature source Oral, resp. rate 21, height 5\' 11"  (1.803 m), weight 250 lb (113.399 kg), SpO2 96 %. GENERAL- alert, co-operative, appears as stated age, not in any distress. HEENT- Atraumatic, normocephalic, PERRL, EOMI, oral mucosa appears moist,  No carotid bruit appreciated, neck supple. CARDIAC- Regular RR, no murmurs, rubs or gallops. RESP- Moving equal volumes of air, and clear to auscultation bilaterally, no wheezes or crackles. ABDOMEN- Soft, nontender, no palpable masses or organomegaly, bowel sounds present. BACK- Normal curvature of the spine, No tenderness along the vertebrae, no CVA tenderness. NEURO- left sided facial droop apparent, uvular appears deviated to right, though tongue medline, though visibilty mildly limited by patient habitus, Strenght- Right side- 5/5- upper and lower, Strenght left side- strenght- 4/5, upper and lower extremities, sensation intact. No obvious Cr N abnormality, strenght upper and lower extremities- 5/5, Sensation intact- globally, DTRs- Normal, finger to nose test normal bilat, heel to chin normal. EXTREMITIES- pulse 2+, symmetric, no pedal edema. SKIN- Warm, dry, No rash or lesion. PSYCH- Normal mood and affect, appropriate thought content and speech.  Lab results: Basic Metabolic Panel:  Recent Labs  09/12/14 1041 09/12/14 1058  NA 137 139  K 4.5 4.6  CL 109 107  CO2 23  --   GLUCOSE 196* 198*  BUN 28* 35*  CREATININE 1.31 1.20  CALCIUM 10.0  --    Liver Function Tests:  Recent Labs  09/12/14 1041  AST 31  ALT 26  ALKPHOS 55  BILITOT 1.0  PROT 7.4  ALBUMIN 4.1   CBC:  Recent  Labs  09/12/14 1041 09/12/14 1058  WBC 12.4*  --   NEUTROABS 9.6*  --   HGB 15.5 16.7  HCT 44.4 49.0  MCV 90.4  --   PLT 167  --    Coagulation:  Recent Labs  09/12/14 1041  LABPROT 12.8  INR 0.95   Urinalysis:  Recent Labs  09/12/14 1300  COLORURINE YELLOW  LABSPEC 1.021  PHURINE 5.0  GLUCOSEU 100*  HGBUR NEGATIVE  BILIRUBINUR NEGATIVE  KETONESUR NEGATIVE  PROTEINUR 100*  UROBILINOGEN 0.2  NITRITE NEGATIVE  LEUKOCYTESUR NEGATIVE   Imaging results:  Ct Head (brain) Wo Contrast  09/12/2014   CLINICAL DATA:  Left arm weakness, onset at 8 a.m. today. History of throat cancer.  EXAM: CT HEAD WITHOUT CONTRAST  TECHNIQUE: Contiguous axial images were obtained from the base of the skull through the vertex without intravenous contrast.  COMPARISON:  None.  FINDINGS: There is immediate low-attenuation in the right middle cerebral artery territory, involving predominantly the right frontal and right parietal lobes. Remote infarcts are seen in the left occipital lobe and left cerebellar hemisphere. No evidence of acute hemorrhage, mass lesion, mass effect or hydrocephalus.  No air-fluid levels in the visualized portions of the paranasal sinuses or mastoid air cells.  IMPRESSION: 1. 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 2. Remote left occipital and left cerebellar infarcts.   Electronically Signed   By: Lorin Picket M.D.   On: 09/12/2014 12:10    Other results: EKG: 89 BPM, with multifocal PVCs, axis- equivocal, no concerning ST abnormalities, but with T wave inversions in V5 , V6. No priors to compare with.  Assessment & Plan by Problem: Principal Problem:   CVA (cerebral infarction) Active Problems:   HTN (hypertension)   DM (diabetes mellitus)  CVA- Right Middle cerebral Artery infarct, not a candidate for TPA as outside TPA window. Risk factors HTN, DM, and obesity. Pt has been complaint with aspirin therapy and has technically failed aspirin treatment.  Pt has never smoked cigs. Ct also notes old infarct- Remote left occipital and left cerebellar infarcts.  - Neuro consulted, appreciate recs. - Stroke Work up - Admit to tele- check tele tomorrow, for paroxysmal atria fib - Was given 325mg  Aspirin in the ED today, awaiting neuro recs for antiplatelet therapy for tomorrow and on discharge, likely plavix. - Echo - Carotid dopplers - Cont Statin- pravastatin- 40mg  daily, consider switching to high intensity statin tomorrow- Atorvastatin or Rosuvastatin - Lipid panel - HgBA1c - Bedside swallow eval, then switch to Heart health/carb mod diet. - Neuro checks Q2H for first 12 hrs then Q4H - Fall precautions - PT eval - OT eval - Considering findings already present on Ct did not order MRI. - Slight leukocytosis- repeat in the am.  DM- HGBA1c pending. Home meds- On Januvia- 100mg  daily - SSI-S  HTN- Bp in the Ed- 135/80, but as high as 176/102 on admit, Home meds- Coreg- 12.5mg  BID, Lisinopril- 40mg  daily,  - Hold BP meds to allow for permissive HTN. - Holding parameters- Hydralazine- 5mg  for BP >220/110.  History of CHF: Patient was admitted in 2012 for apparent shock, found to be 2/2 to CHF with ECHO showing LVEF of 10-20%. Patient does not have clinical signs or symptoms of CHF presently, and is not receiving therapy for CHF.  - ECHO pending.  Code status- FULL  VTE PPX- Heparin  Dispo: Disposition is deferred at this time, awaiting improvement of current medical problems. Anticipated discharge in approximately 1-2 day(s).   The patient does have a current PCP (No primary care provider on file.) and does need an [facility] hospital follow-up appointment after discharge.  The patient does have transportation limitations that hinder transportation to clinic appointments.  Signed: Bethena Roys, MD 09/12/2014, 3:30 PM

## 2014-09-12 NOTE — ED Notes (Signed)
Per EMS: Pt from home. Reprots sudden onset of left sided weakness, slurred speech and facial droop starting at 2000 last night. Pt awoke this AM with continued symptoms. Pt is AO x4. Reports 3/10 right sided HA "behind my eye." Left sided arm weakness and left sided facial droop present. Hx: HTN, diabetes

## 2014-09-12 NOTE — H&P (Signed)
Date: 09/12/2014               Patient Name:  Brian Hall MRN: [id number]  DOB: 1955-11-27 Age / Sex: 59 y.o., male   PCP: No primary care provider on file.         Medical Service: Internal Medicine Teaching Service         Attending Physician: Dr. Dot Lanes, MD    First Contact: Crecencio Mc Pager: [phone]  Second Contact: Dr. Bing Neighbors Pager: [phone]       After Hours (After 5p/  First Contact Pager: [phone]  weekends / holidays): Second Contact Pager: [phone]   Chief Complaint: Slurred speech   History of Present Illness:  The history was provided by the patient and his wife who was present for the duration of his evaluation.   Brian Hall is a 59 year old man with a past medical history of DMII, HTN, hyperlipidemia, hypothyroidism, and throat cancer who presented to the ED for complaint of an approximately 12 hour history of slurred speech and weakness on the right side of his body.   He states he first started to notice symptoms last night when he felt like he was "losing his equilibrium." He states he also began experiencing weakness in his left leg and his left arm at this time. His wife notes that at this time he also began to have slurred speech, and she noticed the left side of his face was drooping. The symptoms became worse over night, eventually to the point where the patient was unable to stand on his own without assistance, and even fell over while he was in the bathroom.  In addition the weakness and slurred speech, he also complains of a pain that feels like it is located behind his right eye. He denies dizziness, blurred vision, shortness of breath, chest pain or pressure, nausea, vomiting, diarrhea, or bowel incontinence. He does report some difficulty controlling his bladder overnight, but mainly because he was having such a difficult time getting out of bed and getting to the bathroom. He denies a history of previous strokes, or episodes with  similar symptoms in the past   Meds: No current facility-administered medications for this encounter.   Current Outpatient Prescriptions  Medication Sig Dispense Refill  . aspirin EC 81 MG tablet Take 81 mg by mouth daily.    . carvedilol (COREG) 12.5 MG tablet Take 12.5 mg by mouth 2 (two) times daily.  0  . cholecalciferol (VITAMIN D) 400 UNITS TABS tablet Take 400 Units by mouth daily.    Marland Kitchen JANUVIA 100 MG tablet Take 100 mg by mouth daily.  0  . levothyroxine (SYNTHROID, LEVOTHROID) 88 MCG tablet Take 0.88 mcg by mouth daily.    Marland Kitchen lisinopril (PRINIVIL,ZESTRIL) 40 MG tablet Take 40 mg by mouth daily.    . metFORMIN (GLUCOPHAGE-XR) 750 MG 24 hr tablet Take 750 mg by mouth 2 (two) times daily.    . Multiple Vitamins-Minerals (MULTIVITAMIN WITH MINERALS) tablet Take 1 tablet by mouth daily.    Marland Kitchen omeprazole (PRILOSEC) 40 MG capsule Take 40 mg by mouth daily.    . pravastatin (PRAVACHOL) 40 MG tablet Take 40 mg by mouth daily.      Allergies: Allergies as of 09/12/2014  . (No Known Allergies)   Past Medical History  Diagnosis Date  . Hypertension   . Diabetes mellitus   . Obesity   . CHF (congestive heart failure)   . SOB (shortness  of breath)   . Hyperkalemia   . Renal insufficiency   . Cardiogenic shock   . Cardiac LV ejection fraction 10-20%    Past Surgical History  Procedure Laterality Date  . Cardiac catheterization     Family History  Problem Relation Age of Onset  . Hypertension Mother   . Hypertension Father    History   Social History  . Marital Status: Legally Separated    Spouse Name: N/A    Number of Children: N/A  . Years of Education: N/A   Occupational History  . Not on file.   Social History Main Topics  . Smoking status: Not on file  . Smokeless tobacco: Not on file  . Alcohol Use: No  . Drug Use: No  . Sexual Activity: Not on file   Other Topics Concern  . Not on file   Social History Narrative    Review of Systems: Pertinent items  are noted in HPI.  Physical Exam: Blood pressure 147/98, pulse 65, temperature 97.8 F (36.6 C), temperature source Oral, resp. rate 21, height 5\' 11"  (1.803 m), weight 113.399 kg (250 lb), SpO2 96 %. General appearance: alert, cooperative and no distress Head: Normocephalic, without obvious abnormality, atraumatic Eyes: conjunctivae/corneas clear. PERRL, EOM's intact. Fundi benign. Throat: oropharynx is moist and without lesion or erythema Neck: no adenopathy, no carotid bruit, no JVD, supple, symmetrical, trachea midline, thyroid not enlarged, symmetric, no tenderness/mass/nodules and there is a large surgical scar on the right side of the neck Back: no spinal tenderness Lungs: clear to auscultation bilaterally Heart: regular rate and rhythm, S1, S2 normal, no murmur, click, rub or gallop Abdomen: soft, non-tender; bowel sounds normal; no masses,  no organomegaly Pulses: 2+ and symmetric Neurologic: Mental status: alert, able to follow complex commands Cranial nerves: II: visual field normal, II: pupils equal, round, reactive to light and accommodation, III,VII: ptosis not present, III,IV,VI: extraocular muscles extra-ocular motions intact, V: facial light touch sensation normal bilaterally, VII: upper facial muscle function normal bilaterally, VII: lower facial muscle function abnormal with significant facial droop present on the left, smile asymmetric with weakness on the left, right side is normal, XII: tongue strength normal  Sensory: light touch sensation normal on the face, decreased on the left arm and left leg, normal on the right Motor: 5/5 RUE, 3/5 in the LUE, 3/5 left grip stength, 5/5 RLE, 4-/5 RLE, plantarflexion 5/5 bilaterally, dorsiflexion 5/5 on the right and 4/5 on left Reflexes: 2+ in the upper extremities, 1+ patellar reflexes, achilles deferred, Babinski equivicol bilaterally  Lab results: Basic Metabolic Panel:  Recent Labs  09/12/14 1041 09/12/14 1058  NA 137 139    K 4.5 4.6  CL 109 107  CO2 23  --   GLUCOSE 196* 198*  BUN 28* 35*  CREATININE 1.31 1.20  CALCIUM 10.0  --    Liver Function Tests:  Recent Labs  09/12/14 1041  AST 31  ALT 26  ALKPHOS 55  BILITOT 1.0  PROT 7.4  ALBUMIN 4.1   No results for input(s): LIPASE, AMYLASE in the last 72 hours. No results for input(s): AMMONIA in the last 72 hours. CBC:  Recent Labs  09/12/14 1041 09/12/14 1058  WBC 12.4*  --   NEUTROABS 9.6*  --   HGB 15.5 16.7  HCT 44.4 49.0  MCV 90.4  --   PLT 167  --   Coagulation:  Recent Labs  09/12/14 1041  LABPROT 12.8  INR 0.95   Recent Labs  09/12/14 1300  COLORURINE YELLOW  LABSPEC 1.021  PHURINE 5.0  GLUCOSEU 100*  HGBUR NEGATIVE  BILIRUBINUR NEGATIVE  KETONESUR NEGATIVE  PROTEINUR 100*  UROBILINOGEN 0.2  NITRITE NEGATIVE  LEUKOCYTESUR NEGATIVE   Imaging results:  Ct Head (brain) Wo Contrast  09/12/2014   CLINICAL DATA:  Left arm weakness, onset at 8 a.m. today. History of throat cancer.  EXAM: CT HEAD WITHOUT CONTRAST  TECHNIQUE: Contiguous axial images were obtained from the base of the skull through the vertex without intravenous contrast.  COMPARISON:  None.  FINDINGS: There is immediate low-attenuation in the right middle cerebral artery territory, involving predominantly the right frontal and right parietal lobes. Remote infarcts are seen in the left occipital lobe and left cerebellar hemisphere. No evidence of acute hemorrhage, mass lesion, mass effect or hydrocephalus.  No air-fluid levels in the visualized portions of the paranasal sinuses or mastoid air cells.  IMPRESSION: 1. 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 2. Remote left occipital and left cerebellar infarcts.   Electronically Signed   By: Lorin Picket M.D.   On: 09/12/2014 12:10    Other results: EKG: regular rhythm, low voltage, several premature beats, possible LAD.  Assessment & Plan by Problem: Principal Problem:   CVA (cerebral  infarction) Active Problems:   HTN (hypertension)   DM (diabetes mellitus)   59 y/o man with PMH significant for DMII, HTN, hyperlipidemia, obesitym and hypothyroidism who presents with slurred speech and left sided weakness, now with CT scan showing large right MCA territory stroke.   Right MCA stroke: Slurred speech, left sided weakness, and decreased sensation on the left side are consistent with head CT showing large right MCA territory stroke. Patient presented more than 12 hours after onset of symptoms so is not a candidate for IV thrombolytics.  - patient on 81 mg ASA at home, will increase to 325 mg qd while inpatient - continue home statin, will send lipid panel to assess for additional agents - will hold home diabetes meds while inpatient, sliding scan insulin, will send HgbA1C - Neuro consulted and has seen patient - further antiplatelet therapy per Neuro's recs  - holding home antihypertensive medications - patient will need rehab on d/c - carotid dopplers  DMII: Holding home meds, sliding scale insulin as above- - f/u HgA1c  HTN: holding home antihypertensives  As above - IV metoprolol PRN for SBP>185  Hyperlipidemia: continue home statin as above - fasting lipid panel in the morning  Hypothyroidism: Continue home  levothyroxin  History of CHF: Patient was admitted in 2012 for apparent shock, found to be 2/2 to CHF with ECHO showing LVEF of 10-20%. Patient does not have clinical signs or symptoms of CHF presently, and is not receiving therapy for CHF.  - consider repeat echo to rule out ? Cardiogenic causes of stroke  Dispo: Disposition is deferred at this time, awaiting improvement of current medical problems. Anticipated discharge in approximately 3-4 day(s).   The patient does have a current PCP (No primary care provider on file.) and does need an [facility] hospital follow-up appointment after discharge.  The patient does not have transportation limitations that hinder  transportation to clinic appointments.  Signed: Crecencio Mc, Med Student 09/12/2014, 2:59 PM

## 2014-09-12 NOTE — Consult Note (Signed)
Referring Physician: Audie Pinto    Chief Complaint: stroke  HPI:                                                                                                                                         Brian Hall is an 59 y.o. male presenting to hospital after prolonged period of time which he had dysarthria and left sided weakness.  Patient states around 2000 hours last night he stood up and noted he was weaker on his left leg and noted slurred speech.  His wife asked him to go to the hospital but patient did not want to go. Later that night he went to the bathroom and fell due to his left leg not strong enough to support him. He went to sleep and when awaking this AM with continued symptoms he called EMS. Patient does take a 81 mg ASA daily and has not missed any doses. He denies ETOH or tobacco.   Date last known well: Date: 09/11/2014 Time last known well: Time: 20:00 tPA Given: No: out of window  Past Medical History  Diagnosis Date  . Hypertension   . Diabetes mellitus   . Obesity   . CHF (congestive heart failure)   . SOB (shortness of breath)   . Hyperkalemia   . Renal insufficiency   . Cardiogenic shock   . Cardiac LV ejection fraction 10-20%     Past Surgical History  Procedure Laterality Date  . Cardiac catheterization      Family History  Problem Relation Age of Onset  . Hypertension Mother   . Hypertension Father    Social History:  reports that he does not drink alcohol or use illicit drugs. His tobacco history is not on file.  Allergies: No Known Allergies  Medications:                                                                                                                           No current facility-administered medications for this encounter.   Current Outpatient Prescriptions  Medication Sig Dispense Refill  . aspirin EC 81 MG tablet Take 81 mg by mouth daily.    . carvedilol (COREG) 12.5 MG tablet Take 12.5 mg by mouth 2 (two) times daily.  0   . cholecalciferol (VITAMIN D) 400 UNITS TABS tablet Take 400 Units by mouth daily.    Marland Kitchen  JANUVIA 100 MG tablet Take 100 mg by mouth daily.  0  . levothyroxine (SYNTHROID, LEVOTHROID) 88 MCG tablet Take 0.88 mcg by mouth daily.    Marland Kitchen lisinopril (PRINIVIL,ZESTRIL) 40 MG tablet Take 40 mg by mouth daily.    . metFORMIN (GLUCOPHAGE-XR) 750 MG 24 hr tablet Take 750 mg by mouth 2 (two) times daily.    . Multiple Vitamins-Minerals (MULTIVITAMIN WITH MINERALS) tablet Take 1 tablet by mouth daily.    Marland Kitchen omeprazole (PRILOSEC) 40 MG capsule Take 40 mg by mouth daily.    . pravastatin (PRAVACHOL) 40 MG tablet Take 40 mg by mouth daily.       ROS:                                                                                                                                       History obtained from the patient  General ROS: negative for - chills, fatigue, fever, night sweats, weight gain or weight loss Psychological ROS: negative for - behavioral disorder, hallucinations, memory difficulties, mood swings or suicidal ideation Ophthalmic ROS: negative for - blurry vision, double vision, eye pain or loss of vision ENT ROS: negative for - epistaxis, nasal discharge, oral lesions, sore throat, tinnitus or vertigo Allergy and Immunology ROS: negative for - hives or itchy/watery eyes Hematological and Lymphatic ROS: negative for - bleeding problems, bruising or swollen lymph nodes Endocrine ROS: negative for - galactorrhea, hair pattern changes, polydipsia/polyuria or temperature intolerance Respiratory ROS: negative for - cough, hemoptysis, shortness of breath or wheezing Cardiovascular ROS: negative for - chest pain, dyspnea on exertion, edema or irregular heartbeat Gastrointestinal ROS: negative for - abdominal pain, diarrhea, hematemesis, nausea/vomiting or stool incontinence Genito-Urinary ROS: negative for - dysuria, hematuria, incontinence or urinary frequency/urgency Musculoskeletal ROS: negative for -  joint swelling or muscular weakness Neurological ROS: as noted in HPI Dermatological ROS: negative for rash and skin lesion changes  Neurologic Examination:                                                                                                      Blood pressure 147/98, pulse 65, temperature 97.8 F (36.6 C), temperature source Oral, resp. rate 21, height 5\' 11"  (1.803 m), weight 113.399 kg (250 lb), SpO2 96 %.  HEENT-  Normocephalic, no lesions, without obvious abnormality.  Normal external eye and conjunctiva.  Normal TM's bilaterally.  Normal auditory canals and external ears. Normal external nose, mucus membranes and septum.  Normal pharynx. Cardiovascular- S1, S2  normal, pulses palpable throughout   Lungs- no tachypnea, retractions or cyanosis Abdomen- normal findings: bowel sounds normal Extremities- no joint deformities, effusion, or inflammation Lymph-no adenopathy palpable Musculoskeletal-no joint tenderness, deformity or swelling, no muscular tenderness noted Skin-warm and dry, no hyperpigmentation, vitiligo, or suspicious lesions  Neurological Examination Mental Status: Alert, oriented, thought content appropriate.  Speech dysarthric without evidence of aphasia.  Able to follow 3 step commands without difficulty. Cranial Nerves: II: Discs flat bilaterally; Visual fields grossly normal, pupils equal, round, reactive to light and accommodation III,IV, VI: ptosis not present, extra-ocular motions intact bilaterally V,VII: smile asymmetric on the left, facial light touch sensation normal bilaterally VIII: hearing normal bilaterally IX,X: gag reflex present XI: bilateral shoulder shrug XII: midline tongue extension Motor: Right : Upper extremity   5/5    Left:     Upper extremity   3/5  Lower extremity   5/5     Lower extremity   4-/5  Tone and bulk:normal tone throughout; no atrophy noted Sensory: Pinprick and light touch intact throughout, but stated to be decreased  on the left leg and did not feel the left arm to DSS. Deep Tendon Reflexes: 2+ and symmetric throughout UE, 1+ in KJ and no AJ Plantars: equivocal bilaterally Cerebellar: normal finger-to-nose on the right and left, normal heel-to-shin test on the right with ataxia in proportion to weakness on the left.  Gait: not tested due to safety       Lab Results: Basic Metabolic Panel:  Recent Labs Lab 09/12/14 1041 09/12/14 1058  NA 137 139  K 4.5 4.6  CL 109 107  CO2 23  --   GLUCOSE 196* 198*  BUN 28* 35*  CREATININE 1.31 1.20  CALCIUM 10.0  --     Liver Function Tests:  Recent Labs Lab 09/12/14 1041  AST 31  ALT 26  ALKPHOS 55  BILITOT 1.0  PROT 7.4  ALBUMIN 4.1   No results for input(s): LIPASE, AMYLASE in the last 168 hours. No results for input(s): AMMONIA in the last 168 hours.  CBC:  Recent Labs Lab 09/12/14 1041 09/12/14 1058  WBC 12.4*  --   NEUTROABS 9.6*  --   HGB 15.5 16.7  HCT 44.4 49.0  MCV 90.4  --   PLT 167  --     Cardiac Enzymes: No results for input(s): CKTOTAL, CKMB, CKMBINDEX, TROPONINI in the last 168 hours.  Lipid Panel: No results for input(s): CHOL, TRIG, HDL, CHOLHDL, VLDL, LDLCALC in the last 168 hours.  CBG: No results for input(s): GLUCAP in the last 168 hours.  Microbiology: No results found for this or any previous visit.  Coagulation Studies:  Recent Labs  09/12/14 1041  LABPROT 12.8  INR 0.95    Imaging: Ct Head (brain) Wo Contrast  09/12/2014   CLINICAL DATA:  Left arm weakness, onset at 8 a.m. today. History of throat cancer.  EXAM: CT HEAD WITHOUT CONTRAST  TECHNIQUE: Contiguous axial images were obtained from the base of the skull through the vertex without intravenous contrast.  COMPARISON:  None.  FINDINGS: There is immediate low-attenuation in the right middle cerebral artery territory, involving predominantly the right frontal and right parietal lobes. Remote infarcts are seen in the left occipital lobe  and left cerebellar hemisphere. No evidence of acute hemorrhage, mass lesion, mass effect or hydrocephalus.  No air-fluid levels in the visualized portions of the paranasal sinuses or mastoid air cells.  IMPRESSION: 1. 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  2. Remote left occipital and left cerebellar infarcts.   Electronically Signed   By: Lorin Picket M.D.   On: 09/12/2014 12:10       Assessment and plan discussed with with attending physician and they are in agreement.    Etta Quill PA-C [facility] [phone]  09/12/2014, 2:48 PM   Assessment: 59 y.o. male with acute right MCA stroke. Exam shows left facial droop, left arm and leg weakness with neglect of the left arm to DSS. EKG shows NSR.   Stroke Risk Factors - diabetes mellitus, hyperlipidemia and hypertension  Recommend: 1. HgbA1c, fasting lipid panel 2. MRI, MRA  of the brain without contrast 3. PT consult, OT consult, Speech consult 4. Echocardiogram 5. Carotid dopplers 6. Prophylactic therapy-Antiplatelet med: Aspirin - dose 325 mg daily 7. Risk factor modification 8. Telemetry monitoring 9. Frequent neuro checks 10 NPO until passes stroke swallow screen  I personally participated in this patient's evaluation and management, including formulating the above clinical impression and management recommendations.  Rush Farmer M.D. [facility] [phone]

## 2014-09-13 ENCOUNTER — Inpatient Hospital Stay (HOSPITAL_COMMUNITY): Payer: 59

## 2014-09-13 DIAGNOSIS — I6521 Occlusion and stenosis of right carotid artery: Secondary | ICD-10-CM

## 2014-09-13 DIAGNOSIS — I5020 Unspecified systolic (congestive) heart failure: Secondary | ICD-10-CM

## 2014-09-13 DIAGNOSIS — I369 Nonrheumatic tricuspid valve disorder, unspecified: Secondary | ICD-10-CM

## 2014-09-13 DIAGNOSIS — I63511 Cerebral infarction due to unspecified occlusion or stenosis of right middle cerebral artery: Principal | ICD-10-CM

## 2014-09-13 DIAGNOSIS — E785 Hyperlipidemia, unspecified: Secondary | ICD-10-CM

## 2014-09-13 DIAGNOSIS — I639 Cerebral infarction, unspecified: Secondary | ICD-10-CM | POA: Insufficient documentation

## 2014-09-13 LAB — CBC
HCT: 43.9 % (ref 39.0–52.0)
Hemoglobin: 14.9 g/dL (ref 13.0–17.0)
MCH: 32 pg (ref 26.0–34.0)
MCHC: 33.9 g/dL (ref 30.0–36.0)
MCV: 94.2 fL (ref 78.0–100.0)
PLATELETS: 155 10*3/uL (ref 150–400)
RBC: 4.66 MIL/uL (ref 4.22–5.81)
RDW: 13.2 % (ref 11.5–15.5)
WBC: 11.6 10*3/uL — AB (ref 4.0–10.5)

## 2014-09-13 LAB — HEMOGLOBIN A1C
HEMOGLOBIN A1C: 7.7 % — AB (ref <5.7)
Mean Plasma Glucose: 174 mg/dL — ABNORMAL HIGH (ref <117)

## 2014-09-13 LAB — GLUCOSE, CAPILLARY
GLUCOSE-CAPILLARY: 189 mg/dL — AB (ref 70–99)
Glucose-Capillary: 156 mg/dL — ABNORMAL HIGH (ref 70–99)
Glucose-Capillary: 163 mg/dL — ABNORMAL HIGH (ref 70–99)
Glucose-Capillary: 151 mg/dL — ABNORMAL HIGH (ref 70–99)

## 2014-09-13 MED ORDER — PERFLUTREN LIPID MICROSPHERE
1.0000 mL | INTRAVENOUS | Status: AC | PRN
  Administered 2014-09-13: 2 mL via INTRAVENOUS
  Administered 2014-09-13: 1 mL via INTRAVENOUS
  Administered 2014-09-13: 2 mL via INTRAVENOUS
  Filled 2014-09-13: qty 10

## 2014-09-13 NOTE — Progress Notes (Signed)
PT Cancellation Note  Patient Details Name: Brian Hall MRN: [id number] DOB: 04-Dec-1955   Cancelled Treatment:    Reason Eval/Treat Not Completed: Patient not medically ready Holding PT evaluation as pt on strict bedrest. Will await increase in activity orders prior to evaluation.   Candy Sledge A 09/13/2014, 1:54 PM  Candy Sledge, [place], DPT [phone]

## 2014-09-13 NOTE — Progress Notes (Signed)
Subjective: He had worsened weakness of the left arm overnight with difficulty eating grits this morning. No SOB, confusion, blurry vision, or bowel/bladder incontinence.   Objective: Vital signs in last 24 hours: Filed Vitals:   09/13/14 0951 09/13/14 1418 09/13/14 1606 09/13/14 1833  BP: 139/73 126/80 141/83 115/91  Pulse: 79 70 76 76  Temp: 98.1 F (36.7 C) 97.9 F (36.6 C) 98.6 F (37 C) 100.2 F (37.9 C)  TempSrc: Oral Oral Oral Oral  Resp: 18 20 18 20   Height:      Weight:      SpO2: 100% 100% 98% 98%   Weight change:   Intake/Output Summary (Last 24 hours) at 09/13/14 2131 Last data filed at 09/13/14 1743  Gross per 24 hour  Intake      0 ml  Output    800 ml  Net   -800 ml   Vitals reviewed. General: resting in bed, in NAD HEENT: PERRL, EOMI, no scleral icterus, no visual field defect noted Cardiac: RRR, no rubs, murmurs or gallops Pulm: clear to auscultation bilaterally, no wheezes, rales, or rhonchi Abd: soft, nontender, nondistended, BS present Ext: warm and well perfused, no pedal edema Neuro: alert and oriented X3, left facial droop, mild dysarthria, UE Strength on left 0/5, on right 5/5. Intact sensation to light tough bilaterally in UE and LE.  LE strength 4/5 on left and 5/5 on right. Plantar reflex: Down going toes on right, mute on left.   Lab Results: Basic Metabolic Panel:  Recent Labs Lab 09/12/14 1041 09/12/14 1058  NA 137 139  K 4.5 4.6  CL 109 107  CO2 23  --   GLUCOSE 196* 198*  BUN 28* 35*  CREATININE 1.31 1.20  CALCIUM 10.0  --    Liver Function Tests:  Recent Labs Lab 09/12/14 1041  AST 31  ALT 26  ALKPHOS 55  BILITOT 1.0  PROT 7.4  ALBUMIN 4.1   CBC:  Recent Labs Lab 09/12/14 1041 09/12/14 1058 09/13/14 0530  WBC 12.4*  --  11.6*  NEUTROABS 9.6*  --   --   HGB 15.5 16.7 14.9  HCT 44.4 49.0 43.9  MCV 90.4  --  94.2  PLT 167  --  155   CBG:  Recent Labs Lab 09/12/14 2203 09/13/14 0648 09/13/14 1134  09/13/14 1631  GLUCAP 190* 156* 163* 189*   Hemoglobin A1C:  Recent Labs Lab 09/12/14 1041  HGBA1C 7.7*   Fasting Lipid Panel:  Recent Labs Lab 09/12/14 1041  CHOL 174  HDL 32*  LDLCALC 88  TRIG 271*  CHOLHDL 5.4   Coagulation:  Recent Labs Lab 09/12/14 1041  LABPROT 12.8  INR 0.95   Urinalysis:  Recent Labs Lab 09/12/14 1300  COLORURINE YELLOW  LABSPEC 1.021  PHURINE 5.0  GLUCOSEU 100*  HGBUR NEGATIVE  BILIRUBINUR NEGATIVE  KETONESUR NEGATIVE  PROTEINUR 100*  UROBILINOGEN 0.2  NITRITE NEGATIVE  LEUKOCYTESUR NEGATIVE   Studies/Results: Ct Head (brain) Wo Contrast  09/12/2014   CLINICAL DATA:  Left arm weakness, onset at 8 a.m. today. History of throat cancer.  EXAM: CT HEAD WITHOUT CONTRAST  TECHNIQUE: Contiguous axial images were obtained from the base of the skull through the vertex without intravenous contrast.  COMPARISON:  None.  FINDINGS: There is immediate low-attenuation in the right middle cerebral artery territory, involving predominantly the right frontal and right parietal lobes. Remote infarcts are seen in the left occipital lobe and left cerebellar hemisphere. No evidence of acute hemorrhage,  mass lesion, mass effect or hydrocephalus.  No air-fluid levels in the visualized portions of the paranasal sinuses or mastoid air cells.  IMPRESSION: 1. 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 2. Remote left occipital and left cerebellar infarcts.   Electronically Signed   By: Lorin Picket M.D.   On: 09/12/2014 12:10   Mr Jodene Nam Head Wo Contrast  09/13/2014   CLINICAL DATA:  Acute left-sided weakness. Evaluate for stroke. Initial evaluation.  EXAM: MRI HEAD WITHOUT CONTRAST  MRA HEAD WITHOUT CONTRAST  TECHNIQUE: Multiplanar, multiecho pulse sequences of the brain and surrounding structures were obtained without intravenous contrast. Angiographic images of the head were obtained using MRA technique without contrast.  COMPARISON:  Prior CT from  09/12/2014.  FINDINGS: MRI HEAD FINDINGS  Diffuse prominence of the CSF containing spaces is compatible with generalized cerebral atrophy. Minimal T2/FLAIR hyperintensity within the periventricular white matter noted, most consistent with chronic small vessel ischemic disease. Encephalomalacia within the medial left occipital lobe compatible with remote left PCA territory infarct. There are scattered remote left cerebellar infarcts.  Extensive restricted diffusion is seen involving the right frontal lobe, compatible with acute right MCA territory infarct. There is associated gyral swelling with edema in the area of infarct without significant mass effect. Probable minimal petechial hemorrhage seen within this region on gradient echo sequence without frank hemorrhagic conversion. No involvement of the right basal ganglia. No other acute intracranial infarct. Gray-white matter differentiation otherwise maintained.  No mass lesion or midline shift. No hydrocephalus. No extra-axial fluid collection.  No acute abnormality seen about the orbits.  Pituitary gland within normal limits.  Mild cerebellar tonsillar ectopia L4-5 mm noted at the craniocervical junction. Visualized upper cervical spine within normal limits.  Visualized bone marrow signal intensity normal.  Paranasal sinuses are clear. Scattered fluid signal intensity noted within the mastoid air cells bilaterally, right greater than left.  MRA HEAD FINDINGS  ANTERIOR CIRCULATION:  Study is degraded by motion artifact.  The distal right internal carotid artery appears to be occluded. No flow seen within the petrous or cavernous segments of the right ICA is well. There is some distal reconstitution at the right ICA terminus via flow from the contralateral left ICA system across the circle of Willis. Markedly diminutive an attenuated flow seen within the right M1 segment and distal right MCA branches.  Visualized distal aspect of the cervical left ICA widely patent  with antegrade flow. The petrous, cavernous, and supra clinoid segments patent without hemodynamically significant stenosis. Left A1 segment and right A1 segment well opacified. The left A2 segment is well opacified. The right A2 segment is markedly diminutive in not well visualized. This may be due to occlusion, high-grade stenosis, or poor flow due to the occluded right ICA.  Left M1 segment well opacified. Atherosclerotic irregularity noted within the distal left MCA branches.  POSTERIOR CIRCULATION:  Dominant. There is a focal severe stenosis within the distal left vertebral artery prior to the loop vertebrobasilar junction. Posterior inferior cerebral artery is not visualized on this exam. Mild multi focal atherosclerotic irregularity present within the basilar artery which remains widely patent. Superior cerebellar arteries are patent proximally, but not well visualized distally.  Right P1 and P2 segments are patent without significant stenosis. The left P1 and proximal P2 segments appear widely patent. The distal left P2 segment is not visualized, and may be occluded.  No aneurysm or vascular malformation.  IMPRESSION: MRI HEAD IMPRESSION:  1. Acute large territory right MCA territory infarct involving  the right frontal lobe without significant mass effect. 2. Remote infarcts involving the medial left occipital lobe and left cerebellar hemisphere. 3. Generalized cerebral atrophy with mild chronic small vessel ischemic disease.  MRA HEAD IMPRESSION:  1. Occluded right internal carotid artery. There is some distal reconstitution at the right ICA terminus via flow across the circle Willis from the contralateral left ICA system. Flow within the right M1 segment and distal right MCA branches is markedly attenuated, and likely inadequate. 2. Poor visualization of the right A2 segment, likely due to occlusion of the right ICA with limited flow across the circle of Willis. 3. Nonvisualization of the mid -distal left  PCA, likely occluded given the presence of remote left occipital infarct. 4. Focal short segment severe stenosis within the distal left vertebral artery. The right vertebral artery is dominant and widely patent.   Electronically Signed   By: Jeannine Boga M.D.   On: 09/13/2014 05:13   Mr Brain Wo Contrast  09/13/2014   CLINICAL DATA:  Acute left-sided weakness. Evaluate for stroke. Initial evaluation.  EXAM: MRI HEAD WITHOUT CONTRAST  MRA HEAD WITHOUT CONTRAST  TECHNIQUE: Multiplanar, multiecho pulse sequences of the brain and surrounding structures were obtained without intravenous contrast. Angiographic images of the head were obtained using MRA technique without contrast.  COMPARISON:  Prior CT from 09/12/2014.  FINDINGS: MRI HEAD FINDINGS  Diffuse prominence of the CSF containing spaces is compatible with generalized cerebral atrophy. Minimal T2/FLAIR hyperintensity within the periventricular white matter noted, most consistent with chronic small vessel ischemic disease. Encephalomalacia within the medial left occipital lobe compatible with remote left PCA territory infarct. There are scattered remote left cerebellar infarcts.  Extensive restricted diffusion is seen involving the right frontal lobe, compatible with acute right MCA territory infarct. There is associated gyral swelling with edema in the area of infarct without significant mass effect. Probable minimal petechial hemorrhage seen within this region on gradient echo sequence without frank hemorrhagic conversion. No involvement of the right basal ganglia. No other acute intracranial infarct. Gray-white matter differentiation otherwise maintained.  No mass lesion or midline shift. No hydrocephalus. No extra-axial fluid collection.  No acute abnormality seen about the orbits.  Pituitary gland within normal limits.  Mild cerebellar tonsillar ectopia L4-5 mm noted at the craniocervical junction. Visualized upper cervical spine within normal  limits.  Visualized bone marrow signal intensity normal.  Paranasal sinuses are clear. Scattered fluid signal intensity noted within the mastoid air cells bilaterally, right greater than left.  MRA HEAD FINDINGS  ANTERIOR CIRCULATION:  Study is degraded by motion artifact.  The distal right internal carotid artery appears to be occluded. No flow seen within the petrous or cavernous segments of the right ICA is well. There is some distal reconstitution at the right ICA terminus via flow from the contralateral left ICA system across the circle of Willis. Markedly diminutive an attenuated flow seen within the right M1 segment and distal right MCA branches.  Visualized distal aspect of the cervical left ICA widely patent with antegrade flow. The petrous, cavernous, and supra clinoid segments patent without hemodynamically significant stenosis. Left A1 segment and right A1 segment well opacified. The left A2 segment is well opacified. The right A2 segment is markedly diminutive in not well visualized. This may be due to occlusion, high-grade stenosis, or poor flow due to the occluded right ICA.  Left M1 segment well opacified. Atherosclerotic irregularity noted within the distal left MCA branches.  POSTERIOR CIRCULATION:  Dominant. There is a  focal severe stenosis within the distal left vertebral artery prior to the loop vertebrobasilar junction. Posterior inferior cerebral artery is not visualized on this exam. Mild multi focal atherosclerotic irregularity present within the basilar artery which remains widely patent. Superior cerebellar arteries are patent proximally, but not well visualized distally.  Right P1 and P2 segments are patent without significant stenosis. The left P1 and proximal P2 segments appear widely patent. The distal left P2 segment is not visualized, and may be occluded.  No aneurysm or vascular malformation.  IMPRESSION: MRI HEAD IMPRESSION:  1. Acute large territory right MCA territory infarct  involving the right frontal lobe without significant mass effect. 2. Remote infarcts involving the medial left occipital lobe and left cerebellar hemisphere. 3. Generalized cerebral atrophy with mild chronic small vessel ischemic disease.  MRA HEAD IMPRESSION:  1. Occluded right internal carotid artery. There is some distal reconstitution at the right ICA terminus via flow across the circle Willis from the contralateral left ICA system. Flow within the right M1 segment and distal right MCA branches is markedly attenuated, and likely inadequate. 2. Poor visualization of the right A2 segment, likely due to occlusion of the right ICA with limited flow across the circle of Willis. 3. Nonvisualization of the mid -distal left PCA, likely occluded given the presence of remote left occipital infarct. 4. Focal short segment severe stenosis within the distal left vertebral artery. The right vertebral artery is dominant and widely patent.   Electronically Signed   By: Jeannine Boga M.D.   On: 09/13/2014 05:13   Medications: I have reviewed the patient's current medications. Scheduled Meds: . heparin  5,000 Units Subcutaneous 3 times per day  . hydrALAZINE  5 mg Intravenous Once  . insulin aspart  0-9 Units Subcutaneous TID WC  . levothyroxine  88 mcg Oral QAC breakfast  . pantoprazole  80 mg Oral Daily  . pravastatin  40 mg Oral Daily   Continuous Infusions:  PRN Meds:.acetaminophen **OR** acetaminophen Assessment/Plan: 59 yr old man with PMH of Dm2, HTN, HLP, presenting with large right MCA stroke with left UE weakness, facial droop, dysarthria.   Right MCA infarct: Seen on CT head non contrast, no tPA as outside therapeutic window. MRA with complete occlusion of right ICA. MRI confirms large right MCA stroke with no significant edema or midline shift at this time. Symptoms progressed, with Ct scan confirming extension, but without midline shift.  overnight with full UE left paralysis. Lipid panel with  cal LDL of 88 (already on statin at home), HgbA1C mildly elevated at 7.7%. 2D echo with no source of emboli, EF 30-35%. 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  -Continue ASA 325mg , may need further antithrombotic agent, will defer to Neuro -Neurology following, appreciate recs -f/u carotid, 2D echo -PT/OT - Recs- CIR- discharge when bed is available -SLP eval if symptoms worsen (passed RN bedside swallow eval) -May need Vascular surgery consult for vascular disease  HTN: Holding home meds, PRN hydralazine for BP >220/110 - Restart home Bp meds- 40mg  lisinopril daily, will help with his cardiomyopathy.  DM2: Holding home metformin/Januvia - Increase to SSI- Mod  Hyperlipidemia: LDL of 88, will switch statin to high intensity- and d/c home pravastatin 40mg .  - Start 10mg  of rosuvastatin- and uptitrate as tolerated to at least 20mg  daily.  Systolic CHF: No volume overload on physical exam. 2D echo from 2012 with EF of 10-20%.  Repeat 2D echo with EF of 30-35% with diffuse hypokinesis.  -  Will need Cardiology consult/follow up - Cont Lisinopril- 40mg  daily.  Dispo: Disposition is deferred at this time, awaiting improvement of current medical problems.  Anticipated discharge in approximately 1-2  day(s).   The patient does not have a current PCP  and does need an [facility] hospital follow-up appointment after discharge.  The patient does not have transportation limitations that hinder transportation to clinic appointments.  .Services Needed at time of discharge: Y = Yes, Blank = No PT:   OT:   RN:   Equipment:   Other:     LOS: 1 day   Blain Pais, MD  [facility], PGY3 09/13/2014, 9:31 PM

## 2014-09-13 NOTE — Progress Notes (Addendum)
  Echocardiogram 2D Echocardiogram with Definity has been performed.  Saraiah Bhat FRANCES 09/13/2014, 3:46 PM

## 2014-09-13 NOTE — Progress Notes (Signed)
Subjective:    Patient noted increase weakness in his left arm overnight. He complains of coughing this morning while eating grits. No new symptoms this morning. Specifically denies any changes in vision, new numbness or tingling, and no symptoms of incontinence.    Objective:    Vital Signs:   Temp:  [97.8 F (36.6 C)-98.5 F (36.9 C)] 98.1 F (36.7 C) (01/12 0951) Pulse Rate:  [58-79] 79 (01/12 0951) Resp:  [16-20] 18 (01/12 0951) BP: (114-147)/(60-98) 139/73 mmHg (01/12 0951) SpO2:  [95 %-100 %] 100 % (01/12 0951) Last BM Date: 09/11/14  24-hour weight change: Weight change:   Intake/Output:   Intake/Output Summary (Last 24 hours) at 09/13/14 1311 Last data filed at 09/12/14 2200  Gross per 24 hour  Intake      0 ml  Output    400 ml  Net   -400 ml      Physical Exam: General: Obese gentleman, lying in bed, no acute distress, responding appropriately to questions HEENT: PERRLA, EOMI, no gross visual field defecits, oropharynx is moist, no erythema or lesion PULM: Lungs clear to auscultation bilaterally, normal work of breathing CV: Heart sounds distant, normal S1/S2, no murmurs, rubs, or gallops ABD: Soft, non-tender, non-distended EXT: No clubbing, cyanosis, or edema NEURO: CNII-IV, VI normal as above, CN V sensation grossly intact,  facial droop present on the left, left lower CN VII weakness noted, CNVIII-XII grossly normal. UE strength is 0/5 on the left, 5/5 on the right, UE sensation intact bilaterally, LE strength is 4/5 on the left, 5/5 on the right, light touch sensation slightly reduced on the left, normal on the right.   Labs:  Basic Metabolic Panel:  Recent Labs Lab 09/12/14 1041 09/12/14 1058  NA 137 139  K 4.5 4.6  CL 109 107  CO2 23  --   GLUCOSE 196* 198*  BUN 28* 35*  CREATININE 1.31 1.20  CALCIUM 10.0  --    Lipid Panel     Component Value Date/Time   CHOL 174 09/12/2014 1041   TRIG 271* 09/12/2014 1041   HDL 32* 09/12/2014 1041     CHOLHDL 5.4 09/12/2014 1041   VLDL 54* 09/12/2014 1041   LDLCALC 88 09/12/2014 1041     Liver Function Tests:  Recent Labs Lab 09/12/14 1041  AST 31  ALT 26  ALKPHOS 55  BILITOT 1.0  PROT 7.4  ALBUMIN 4.1   No results for input(s): LIPASE, AMYLASE in the last 168 hours. No results for input(s): AMMONIA in the last 168 hours.  CBC:  Recent Labs Lab 09/12/14 1041 09/12/14 1058 09/13/14 0530  WBC 12.4*  --  11.6*  NEUTROABS 9.6*  --   --   HGB 15.5 16.7 14.9  HCT 44.4 49.0 43.9  MCV 90.4  --  94.2  PLT 167  --  155    Cardiac Enzymes: No results for input(s): CKTOTAL, CKMB, CKMBINDEX, TROPONINI in the last 168 hours.  BNP: Invalid input(s): POCBNP  CBG:  Recent Labs Lab 09/12/14 2203 09/13/14 0648 09/13/14 1134  GLUCAP 190* 156* 163*    Microbiology: No results found for this or any previous visit.  Coagulation Studies:  Recent Labs  09/12/14 1041  LABPROT 12.8  INR 0.95     Other results: EKG: normal EKG, normal sinus rhythm, unchanged from previous tracings.  Imaging: Ct Head (brain) Wo Contrast  09/12/2014   CLINICAL DATA:  Left arm weakness, onset at 8 a.m. today. History of throat cancer.  EXAM: CT HEAD WITHOUT CONTRAST  TECHNIQUE: Contiguous axial images were obtained from the base of the skull through the vertex without intravenous contrast.  COMPARISON:  None.  FINDINGS: There is immediate low-attenuation in the right middle cerebral artery territory, involving predominantly the right frontal and right parietal lobes. Remote infarcts are seen in the left occipital lobe and left cerebellar hemisphere. No evidence of acute hemorrhage, mass lesion, mass effect or hydrocephalus.  No air-fluid levels in the visualized portions of the paranasal sinuses or mastoid air cells.  IMPRESSION: 1. 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 2. Remote left occipital and left cerebellar infarcts.   Electronically Signed   By: Lorin Picket  M.D.   On: 09/12/2014 12:10   Mr Jodene Nam Head Wo Contrast  09/13/2014   CLINICAL DATA:  Acute left-sided weakness. Evaluate for stroke. Initial evaluation.  EXAM: MRI HEAD WITHOUT CONTRAST  MRA HEAD WITHOUT CONTRAST  TECHNIQUE: Multiplanar, multiecho pulse sequences of the brain and surrounding structures were obtained without intravenous contrast. Angiographic images of the head were obtained using MRA technique without contrast.  COMPARISON:  Prior CT from 09/12/2014.  FINDINGS: MRI HEAD FINDINGS  Diffuse prominence of the CSF containing spaces is compatible with generalized cerebral atrophy. Minimal T2/FLAIR hyperintensity within the periventricular white matter noted, most consistent with chronic small vessel ischemic disease. Encephalomalacia within the medial left occipital lobe compatible with remote left PCA territory infarct. There are scattered remote left cerebellar infarcts.  Extensive restricted diffusion is seen involving the right frontal lobe, compatible with acute right MCA territory infarct. There is associated gyral swelling with edema in the area of infarct without significant mass effect. Probable minimal petechial hemorrhage seen within this region on gradient echo sequence without frank hemorrhagic conversion. No involvement of the right basal ganglia. No other acute intracranial infarct. Gray-white matter differentiation otherwise maintained.  No mass lesion or midline shift. No hydrocephalus. No extra-axial fluid collection.  No acute abnormality seen about the orbits.  Pituitary gland within normal limits.  Mild cerebellar tonsillar ectopia L4-5 mm noted at the craniocervical junction. Visualized upper cervical spine within normal limits.  Visualized bone marrow signal intensity normal.  Paranasal sinuses are clear. Scattered fluid signal intensity noted within the mastoid air cells bilaterally, right greater than left.  MRA HEAD FINDINGS  ANTERIOR CIRCULATION:  Study is degraded by motion  artifact.  The distal right internal carotid artery appears to be occluded. No flow seen within the petrous or cavernous segments of the right ICA is well. There is some distal reconstitution at the right ICA terminus via flow from the contralateral left ICA system across the circle of Willis. Markedly diminutive an attenuated flow seen within the right M1 segment and distal right MCA branches.  Visualized distal aspect of the cervical left ICA widely patent with antegrade flow. The petrous, cavernous, and supra clinoid segments patent without hemodynamically significant stenosis. Left A1 segment and right A1 segment well opacified. The left A2 segment is well opacified. The right A2 segment is markedly diminutive in not well visualized. This may be due to occlusion, high-grade stenosis, or poor flow due to the occluded right ICA.  Left M1 segment well opacified. Atherosclerotic irregularity noted within the distal left MCA branches.  POSTERIOR CIRCULATION:  Dominant. There is a focal severe stenosis within the distal left vertebral artery prior to the loop vertebrobasilar junction. Posterior inferior cerebral artery is not visualized on this exam. Mild multi focal atherosclerotic irregularity present within the basilar artery which  remains widely patent. Superior cerebellar arteries are patent proximally, but not well visualized distally.  Right P1 and P2 segments are patent without significant stenosis. The left P1 and proximal P2 segments appear widely patent. The distal left P2 segment is not visualized, and may be occluded.  No aneurysm or vascular malformation.  IMPRESSION: MRI HEAD IMPRESSION:  1. Acute large territory right MCA territory infarct involving the right frontal lobe without significant mass effect. 2. Remote infarcts involving the medial left occipital lobe and left cerebellar hemisphere. 3. Generalized cerebral atrophy with mild chronic small vessel ischemic disease.  MRA HEAD IMPRESSION:  1.  Occluded right internal carotid artery. There is some distal reconstitution at the right ICA terminus via flow across the circle Willis from the contralateral left ICA system. Flow within the right M1 segment and distal right MCA branches is markedly attenuated, and likely inadequate. 2. Poor visualization of the right A2 segment, likely due to occlusion of the right ICA with limited flow across the circle of Willis. 3. Nonvisualization of the mid -distal left PCA, likely occluded given the presence of remote left occipital infarct. 4. Focal short segment severe stenosis within the distal left vertebral artery. The right vertebral artery is dominant and widely patent.   Electronically Signed   By: Jeannine Boga M.D.   On: 09/13/2014 05:13   Mr Brain Wo Contrast  09/13/2014   CLINICAL DATA:  Acute left-sided weakness. Evaluate for stroke. Initial evaluation.  EXAM: MRI HEAD WITHOUT CONTRAST  MRA HEAD WITHOUT CONTRAST  TECHNIQUE: Multiplanar, multiecho pulse sequences of the brain and surrounding structures were obtained without intravenous contrast. Angiographic images of the head were obtained using MRA technique without contrast.  COMPARISON:  Prior CT from 09/12/2014.  FINDINGS: MRI HEAD FINDINGS  Diffuse prominence of the CSF containing spaces is compatible with generalized cerebral atrophy. Minimal T2/FLAIR hyperintensity within the periventricular white matter noted, most consistent with chronic small vessel ischemic disease. Encephalomalacia within the medial left occipital lobe compatible with remote left PCA territory infarct. There are scattered remote left cerebellar infarcts.  Extensive restricted diffusion is seen involving the right frontal lobe, compatible with acute right MCA territory infarct. There is associated gyral swelling with edema in the area of infarct without significant mass effect. Probable minimal petechial hemorrhage seen within this region on gradient echo sequence without  frank hemorrhagic conversion. No involvement of the right basal ganglia. No other acute intracranial infarct. Gray-white matter differentiation otherwise maintained.  No mass lesion or midline shift. No hydrocephalus. No extra-axial fluid collection.  No acute abnormality seen about the orbits.  Pituitary gland within normal limits.  Mild cerebellar tonsillar ectopia L4-5 mm noted at the craniocervical junction. Visualized upper cervical spine within normal limits.  Visualized bone marrow signal intensity normal.  Paranasal sinuses are clear. Scattered fluid signal intensity noted within the mastoid air cells bilaterally, right greater than left.  MRA HEAD FINDINGS  ANTERIOR CIRCULATION:  Study is degraded by motion artifact.  The distal right internal carotid artery appears to be occluded. No flow seen within the petrous or cavernous segments of the right ICA is well. There is some distal reconstitution at the right ICA terminus via flow from the contralateral left ICA system across the circle of Willis. Markedly diminutive an attenuated flow seen within the right M1 segment and distal right MCA branches.  Visualized distal aspect of the cervical left ICA widely patent with antegrade flow. The petrous, cavernous, and supra clinoid segments patent without hemodynamically significant  stenosis. Left A1 segment and right A1 segment well opacified. The left A2 segment is well opacified. The right A2 segment is markedly diminutive in not well visualized. This may be due to occlusion, high-grade stenosis, or poor flow due to the occluded right ICA.  Left M1 segment well opacified. Atherosclerotic irregularity noted within the distal left MCA branches.  POSTERIOR CIRCULATION:  Dominant. There is a focal severe stenosis within the distal left vertebral artery prior to the loop vertebrobasilar junction. Posterior inferior cerebral artery is not visualized on this exam. Mild multi focal atherosclerotic irregularity present  within the basilar artery which remains widely patent. Superior cerebellar arteries are patent proximally, but not well visualized distally.  Right P1 and P2 segments are patent without significant stenosis. The left P1 and proximal P2 segments appear widely patent. The distal left P2 segment is not visualized, and may be occluded.  No aneurysm or vascular malformation.  IMPRESSION: MRI HEAD IMPRESSION:  1. Acute large territory right MCA territory infarct involving the right frontal lobe without significant mass effect. 2. Remote infarcts involving the medial left occipital lobe and left cerebellar hemisphere. 3. Generalized cerebral atrophy with mild chronic small vessel ischemic disease.  MRA HEAD IMPRESSION:  1. Occluded right internal carotid artery. There is some distal reconstitution at the right ICA terminus via flow across the circle Willis from the contralateral left ICA system. Flow within the right M1 segment and distal right MCA branches is markedly attenuated, and likely inadequate. 2. Poor visualization of the right A2 segment, likely due to occlusion of the right ICA with limited flow across the circle of Willis. 3. Nonvisualization of the mid -distal left PCA, likely occluded given the presence of remote left occipital infarct. 4. Focal short segment severe stenosis within the distal left vertebral artery. The right vertebral artery is dominant and widely patent.   Electronically Signed   By: Jeannine Boga M.D.   On: 09/13/2014 05:13       Medications:    Infusions:    Scheduled Medications: . heparin  5,000 Units Subcutaneous 3 times per day  . hydrALAZINE  5 mg Intravenous Once  . insulin aspart  0-9 Units Subcutaneous TID WC  . levothyroxine  88 mcg Oral QAC breakfast  . pantoprazole  80 mg Oral Daily  . pravastatin  40 mg Oral Daily    PRN Medications: acetaminophen **OR** acetaminophen   Assessment/ Plan:    Pt is a 59 y.o. yo male with a PMHx of DMII, HTN, HLD,  and ? CHF who presents with slurred speech and left sided weakness found to have large right sided MCA stroke.   Right sided MCA stroke - CT shows large right MCA stroke, patient presented outside the window for TPA therapy. MRA overnight shows complete occlusion of right ICA, possibly secondary to previous surgery and radiation. Exam worsened overnight, now with flaccid paralysis in the LUE.  - Neuro following, appreciate recs - continue q2h neuro checks, low threshold for increased level of care 2/2 size of stroke - continue ASA 325, further antithrombotic therapy per Neuro recs - repeat head CT tomorrow morning to ensure no new mass effect - HgbA1c 7.7, lipid panel notable for a triglyceride level of 252, risk foster modification as below - echo, carotid dopplers pending, possible candidate for carotid endarterectomy given MRA finding of occluded ICA.  - PT/OT consulted, awaiting rehab recommendations - passed bedside swallow eval  HTN: holding home meds, prn hydralazine for BPs > 220/110  DMII: Holding home metformin/Januvia, currently on SSI - HgbA1c 7.7% - further glucose control per PCP  Lipids: continue home pravastatin 40 mg PO - lipid panel notable for Trig of 252  History of CHF: Patient was admitted in 2012 for apparent shock, found to be 2/2 to CHF with ECHO showing LVEF of 10-20%. Patient does not have clinical signs or symptoms of CHF presently, and is not receiving therapy for CHF.  - ECHO pending.   Code status- FULL  VTE PPX- Heparin  Dispo: Disposition is deferred at this time, awaiting improvement of current medical problems. Anticipated discharge in approximately 1-2 day(s).   The patient does have a current PCP (No primary care provider on file.) and does need an [facility] hospital follow-up appointment after discharge.  The patient does have transportation limitations that hinder transportation to clinic appointments.   SERVICE NEEDED AT [place]         Y = Yes, Blank = No PT:   OT:   RN:   Equipment:   Other:      Length of Stay: 1 day(s)   Signed: Crecencio Mc, Med Student  Pager: [phone] (7AM-5PM) 09/13/2014, 1:11 PM

## 2014-09-13 NOTE — Progress Notes (Signed)
Patient returned from echo. BP 141/83, pulse 76, RR 18, SaO2 98% room air, 98.6 temp orally. Patient is now talking quietly with family. Call bell within patient's reach. Will continue to monitor.

## 2014-09-13 NOTE — Progress Notes (Signed)
OT Cancellation Note  Patient Details Name: MACY LINGENFELTER MRN: [id number] DOB: 08-01-1956   Cancelled Treatment:    Reason Eval/Treat Not Completed: Patient not medically ready - Pt currently, on strict bedrest.  Will initiate OT eval once activity orders increased  Darlina Rumpf Rainbow, OTR/L [phone]  09/13/2014, 3:36 PM

## 2014-09-13 NOTE — Progress Notes (Signed)
*  PRELIMINARY RESULTS* Vascular Ultrasound Carotid Duplex (Doppler) has been completed.  Preliminary findings: Right = occlusion of the right ICA . Left = 1-39% ICA stenosis. Appears to be bilateral vertebral occlusion.    Landry Mellow, RDMS, RVT  09/13/2014, 3:33 PM

## 2014-09-13 NOTE — Progress Notes (Signed)
STROKE TEAM PROGRESS NOTE   HISTORY DASAN HARDMAN is an 59 y.o. male presenting to hospital after prolonged period of time which he had dysarthria and left sided weakness. Patient states around 2000 hours last night 09/11/2013 (LKW) he stood up and noted he was weaker on his left leg and noted slurred speech. His wife asked him to go to the hospital but patient did not want to go. Later that night he went to the bathroom and fell due to his left leg not strong enough to support him. He went to sleep and when awaking this AM with continued symptoms he called EMS. Patient does take a 81 mg ASA daily and has not missed any doses. He denies ETOH or tobacco.  Patient was not administered TPA secondary to delay in arrival. He was admitted for further evaluation and treatment.   SUBJECTIVE (INTERVAL HISTORY) His wife is at the bedside.  Overall he feels his condition is gradually worsening, with slightly increasing left hemiparesis.    OBJECTIVE Temp:  [97.6 F (36.4 C)-98.5 F (36.9 C)] 98.2 F (36.8 C) (01/12 0511) Pulse Rate:  [54-92] 59 (01/12 0511) Cardiac Rhythm:  [-] Normal sinus rhythm (01/11 2002) Resp:  [16-24] 20 (01/12 0511) BP: (114-176)/(60-134) 114/60 mmHg (01/12 0511) SpO2:  [93 %-100 %] 100 % (01/12 0511) Weight:  [113.399 kg (250 lb)] 113.399 kg (250 lb) (01/11 1036)   Recent Labs Lab 09/12/14 2203 09/13/14 0648  GLUCAP 190* 156*    Recent Labs Lab 09/12/14 1041 09/12/14 1058  NA 137 139  K 4.5 4.6  CL 109 107  CO2 23  --   GLUCOSE 196* 198*  BUN 28* 35*  CREATININE 1.31 1.20  CALCIUM 10.0  --     Recent Labs Lab 09/12/14 1041  AST 31  ALT 26  ALKPHOS 55  BILITOT 1.0  PROT 7.4  ALBUMIN 4.1    Recent Labs Lab 09/12/14 1041 09/12/14 1058 09/13/14 0530  WBC 12.4*  --  11.6*  NEUTROABS 9.6*  --   --   HGB 15.5 16.7 14.9  HCT 44.4 49.0 43.9  MCV 90.4  --  94.2  PLT 167  --  155   No results for input(s): CKTOTAL, CKMB, CKMBINDEX, TROPONINI  in the last 168 hours.  Recent Labs  09/12/14 1041  LABPROT 12.8  INR 0.95    Recent Labs  09/12/14 1300  COLORURINE YELLOW  LABSPEC 1.021  PHURINE 5.0  GLUCOSEU 100*  HGBUR NEGATIVE  BILIRUBINUR NEGATIVE  KETONESUR NEGATIVE  PROTEINUR 100*  UROBILINOGEN 0.2  NITRITE NEGATIVE  LEUKOCYTESUR NEGATIVE       Component Value Date/Time   CHOL 174 09/12/2014 1041   TRIG 271* 09/12/2014 1041   HDL 32* 09/12/2014 1041   CHOLHDL 5.4 09/12/2014 1041   VLDL 54* 09/12/2014 1041   LDLCALC 88 09/12/2014 1041   Lab Results  Component Value Date   HGBA1C 7.7* 09/12/2014   No results found for: LABOPIA, COCAINSCRNUR, LABBENZ, AMPHETMU, THCU, LABBARB  No results for input(s): ETH in the last 168 hours.  Ct Head (brain) Wo Contrast  09/12/2014   CLINICAL DATA:  Left arm weakness, onset at 8 a.m. today. History of throat cancer.  EXAM: CT HEAD WITHOUT CONTRAST  TECHNIQUE: Contiguous axial images were obtained from the base of the skull through the vertex without intravenous contrast.  COMPARISON:  None.  FINDINGS: There is immediate low-attenuation in the right middle cerebral artery territory, involving predominantly the right frontal and right  parietal lobes. Remote infarcts are seen in the left occipital lobe and left cerebellar hemisphere. No evidence of acute hemorrhage, mass lesion, mass effect or hydrocephalus.  No air-fluid levels in the visualized portions of the paranasal sinuses or mastoid air cells.  IMPRESSION: 1. 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 2. Remote left occipital and left cerebellar infarcts.   Electronically Signed   By: Lorin Picket M.D.   On: 09/12/2014 12:10   Mr Jodene Nam Head Wo Contrast  09/13/2014   CLINICAL DATA:  Acute left-sided weakness. Evaluate for stroke. Initial evaluation.  EXAM: MRI HEAD WITHOUT CONTRAST  MRA HEAD WITHOUT CONTRAST  TECHNIQUE: Multiplanar, multiecho pulse sequences of the brain and surrounding structures were obtained  without intravenous contrast. Angiographic images of the head were obtained using MRA technique without contrast.  COMPARISON:  Prior CT from 09/12/2014.  FINDINGS: MRI HEAD FINDINGS  Diffuse prominence of the CSF containing spaces is compatible with generalized cerebral atrophy. Minimal T2/FLAIR hyperintensity within the periventricular white matter noted, most consistent with chronic small vessel ischemic disease. Encephalomalacia within the medial left occipital lobe compatible with remote left PCA territory infarct. There are scattered remote left cerebellar infarcts.  Extensive restricted diffusion is seen involving the right frontal lobe, compatible with acute right MCA territory infarct. There is associated gyral swelling with edema in the area of infarct without significant mass effect. Probable minimal petechial hemorrhage seen within this region on gradient echo sequence without frank hemorrhagic conversion. No involvement of the right basal ganglia. No other acute intracranial infarct. Gray-white matter differentiation otherwise maintained.  No mass lesion or midline shift. No hydrocephalus. No extra-axial fluid collection.  No acute abnormality seen about the orbits.  Pituitary gland within normal limits.  Mild cerebellar tonsillar ectopia L4-5 mm noted at the craniocervical junction. Visualized upper cervical spine within normal limits.  Visualized bone marrow signal intensity normal.  Paranasal sinuses are clear. Scattered fluid signal intensity noted within the mastoid air cells bilaterally, right greater than left.  MRA HEAD FINDINGS  ANTERIOR CIRCULATION:  Study is degraded by motion artifact.  The distal right internal carotid artery appears to be occluded. No flow seen within the petrous or cavernous segments of the right ICA is well. There is some distal reconstitution at the right ICA terminus via flow from the contralateral left ICA system across the circle of Willis. Markedly diminutive an  attenuated flow seen within the right M1 segment and distal right MCA branches.  Visualized distal aspect of the cervical left ICA widely patent with antegrade flow. The petrous, cavernous, and supra clinoid segments patent without hemodynamically significant stenosis. Left A1 segment and right A1 segment well opacified. The left A2 segment is well opacified. The right A2 segment is markedly diminutive in not well visualized. This may be due to occlusion, high-grade stenosis, or poor flow due to the occluded right ICA.  Left M1 segment well opacified. Atherosclerotic irregularity noted within the distal left MCA branches.  POSTERIOR CIRCULATION:  Dominant. There is a focal severe stenosis within the distal left vertebral artery prior to the loop vertebrobasilar junction. Posterior inferior cerebral artery is not visualized on this exam. Mild multi focal atherosclerotic irregularity present within the basilar artery which remains widely patent. Superior cerebellar arteries are patent proximally, but not well visualized distally.  Right P1 and P2 segments are patent without significant stenosis. The left P1 and proximal P2 segments appear widely patent. The distal left P2 segment is not visualized, and may be occluded.  No aneurysm or vascular malformation.  IMPRESSION: MRI HEAD IMPRESSION:  1. Acute large territory right MCA territory infarct involving the right frontal lobe without significant mass effect. 2. Remote infarcts involving the medial left occipital lobe and left cerebellar hemisphere. 3. Generalized cerebral atrophy with mild chronic small vessel ischemic disease.  MRA HEAD IMPRESSION:  1. Occluded right internal carotid artery. There is some distal reconstitution at the right ICA terminus via flow across the circle Willis from the contralateral left ICA system. Flow within the right M1 segment and distal right MCA branches is markedly attenuated, and likely inadequate. 2. Poor visualization of the right  A2 segment, likely due to occlusion of the right ICA with limited flow across the circle of Willis. 3. Nonvisualization of the mid -distal left PCA, likely occluded given the presence of remote left occipital infarct. 4. Focal short segment severe stenosis within the distal left vertebral artery. The right vertebral artery is dominant and widely patent.   Electronically Signed   By: Jeannine Boga M.D.   On: 09/13/2014 05:13   Mr Brain Wo Contrast  09/13/2014   CLINICAL DATA:  Acute left-sided weakness. Evaluate for stroke. Initial evaluation.  EXAM: MRI HEAD WITHOUT CONTRAST  MRA HEAD WITHOUT CONTRAST  TECHNIQUE: Multiplanar, multiecho pulse sequences of the brain and surrounding structures were obtained without intravenous contrast. Angiographic images of the head were obtained using MRA technique without contrast.  COMPARISON:  Prior CT from 09/12/2014.  FINDINGS: MRI HEAD FINDINGS  Diffuse prominence of the CSF containing spaces is compatible with generalized cerebral atrophy. Minimal T2/FLAIR hyperintensity within the periventricular white matter noted, most consistent with chronic small vessel ischemic disease. Encephalomalacia within the medial left occipital lobe compatible with remote left PCA territory infarct. There are scattered remote left cerebellar infarcts.  Extensive restricted diffusion is seen involving the right frontal lobe, compatible with acute right MCA territory infarct. There is associated gyral swelling with edema in the area of infarct without significant mass effect. Probable minimal petechial hemorrhage seen within this region on gradient echo sequence without frank hemorrhagic conversion. No involvement of the right basal ganglia. No other acute intracranial infarct. Gray-white matter differentiation otherwise maintained.  No mass lesion or midline shift. No hydrocephalus. No extra-axial fluid collection.  No acute abnormality seen about the orbits.  Pituitary gland within  normal limits.  Mild cerebellar tonsillar ectopia L4-5 mm noted at the craniocervical junction. Visualized upper cervical spine within normal limits.  Visualized bone marrow signal intensity normal.  Paranasal sinuses are clear. Scattered fluid signal intensity noted within the mastoid air cells bilaterally, right greater than left.  MRA HEAD FINDINGS  ANTERIOR CIRCULATION:  Study is degraded by motion artifact.  The distal right internal carotid artery appears to be occluded. No flow seen within the petrous or cavernous segments of the right ICA is well. There is some distal reconstitution at the right ICA terminus via flow from the contralateral left ICA system across the circle of Willis. Markedly diminutive an attenuated flow seen within the right M1 segment and distal right MCA branches.  Visualized distal aspect of the cervical left ICA widely patent with antegrade flow. The petrous, cavernous, and supra clinoid segments patent without hemodynamically significant stenosis. Left A1 segment and right A1 segment well opacified. The left A2 segment is well opacified. The right A2 segment is markedly diminutive in not well visualized. This may be due to occlusion, high-grade stenosis, or poor flow due to the occluded right ICA.  Left M1  segment well opacified. Atherosclerotic irregularity noted within the distal left MCA branches.  POSTERIOR CIRCULATION:  Dominant. There is a focal severe stenosis within the distal left vertebral artery prior to the loop vertebrobasilar junction. Posterior inferior cerebral artery is not visualized on this exam. Mild multi focal atherosclerotic irregularity present within the basilar artery which remains widely patent. Superior cerebellar arteries are patent proximally, but not well visualized distally.  Right P1 and P2 segments are patent without significant stenosis. The left P1 and proximal P2 segments appear widely patent. The distal left P2 segment is not visualized, and may  be occluded.  No aneurysm or vascular malformation.  IMPRESSION: MRI HEAD IMPRESSION:  1. Acute large territory right MCA territory infarct involving the right frontal lobe without significant mass effect. 2. Remote infarcts involving the medial left occipital lobe and left cerebellar hemisphere. 3. Generalized cerebral atrophy with mild chronic small vessel ischemic disease.  MRA HEAD IMPRESSION:  1. Occluded right internal carotid artery. There is some distal reconstitution at the right ICA terminus via flow across the circle Willis from the contralateral left ICA system. Flow within the right M1 segment and distal right MCA branches is markedly attenuated, and likely inadequate. 2. Poor visualization of the right A2 segment, likely due to occlusion of the right ICA with limited flow across the circle of Willis. 3. Nonvisualization of the mid -distal left PCA, likely occluded given the presence of remote left occipital infarct. 4. Focal short segment severe stenosis within the distal left vertebral artery. The right vertebral artery is dominant and widely patent.   Electronically Signed   By: Jeannine Boga M.D.   On: 09/13/2014 05:13     PHYSICAL EXAM Obese young Caucasian male not in distress.Awake alert. Afebrile. Head is nontraumatic. Neck shows deformity and old surgical scar from thyroid cancer surgery and radiation on the right. Hearing is normal. Cardiac exam no murmur or gallop. Lungs are clear to auscultation. Distal pulses are well felt. Neurological Exam : Awake alert oriented 2. Diminished attention, registration and recall. Right gaze preference but able to look to the left past midline. Blinks to threat on the right and partially blinks on the left. Left lower facial weakness. Tongue is midline. Left upper extremity 0/5 strength. Left lower extremity 2/5. Decreased tone on the left. Mild left hemisensory loss. No neglect. Left plantar upgoing right downgoing. Gait was not tested.   ASSESSMENT/PLAN Mr. MYKLE PASCUA is a 59 y.o. male with history of DMII, HTN, hyperlipidemia, hypothyroidism, and throat cancer  presenting with dysarthria and left side weakness. He did not receive IV t-PA due to delay in arrival.   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   Ongoing aggressive stroke risk factor management  No indication to move to ICU at this time. Will repeat CT head in am to check for increasing edema  .Therapy recommendations:  pending   Disposition:  pending  Hypertension Permissive hypertension (OK if < 220/120) but gradually normalize in 5-7 days  Stable  Hyperlipidemia  Home meds:  pravachol 40 resumed in hospital  LDL 88, goal < 70  Continue statin at discharge  Diabetes  HgbA1c 7.7 goal < 7.0  Uncontrolled  Other Stroke Risk Factors  Obesity, Body mass index is 34.88 kg/(m^2).   Hx obstructive sleep apnea, resolved after tonsils were removed.   Other Active Problems  Hx CHF, known low EF 10-20%  Renal insufficiency  Other pertinent history  Hx throat cancer, s/p radiation  Hospital day # [place] for Pager information 09/13/2014 9:38 AM   I have personally examined this patient, reviewed notes, independently viewed imaging studies, participated in medical decision making and plan of care. I have made any additions or  clarifications directly to the above note. Agree with note above. 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 We will complete stroke workup. He likely will need dual antiplatelet therapy for 3 months. He remains at recurrent risk for strokes. I had a long discussion about this with the patient and his and his wife and answered questions. Also discussed with teaching service attending and resident  Antony Contras, MD Medical Director [place] Pager: [phone] 09/13/2014 2:16 PM   To contact Stroke Continuity provider, please refer to [email or web address] After hours, contact General Neurology

## 2014-09-13 NOTE — Progress Notes (Signed)
CARE MANAGEMENT NOTE 09/13/2014  Patient:  Brian Hall, Brian Hall   Account Number:  [id number]  Date Initiated:  09/13/2014  Documentation initiated by:  Olga Coaster  Subjective/Objective Assessment:   ADMITTED WITH STROKE     Action/Plan:   CM FOLLOWING FOR DCP   Anticipated DC Date:  09/16/2014   Anticipated DC Plan:  [place] [facility]  CM consult          Status of service:  In process, will continue to follow Medicare Important Message given?   (If response is "NO", the following Medicare IM given date fields will be blank)  Per UR Regulation:  Reviewed for med. necessity/level of care/duration of stay  Comments:  1/12/2016Mindi Slicker RN,BSN,MHA [phone]

## 2014-09-14 ENCOUNTER — Encounter (HOSPITAL_COMMUNITY): Payer: Self-pay

## 2014-09-14 ENCOUNTER — Inpatient Hospital Stay (HOSPITAL_COMMUNITY): Payer: 59

## 2014-09-14 DIAGNOSIS — I63311 Cerebral infarction due to thrombosis of right middle cerebral artery: Secondary | ICD-10-CM

## 2014-09-14 DIAGNOSIS — I69322 Dysarthria following cerebral infarction: Secondary | ICD-10-CM

## 2014-09-14 DIAGNOSIS — G8194 Hemiplegia, unspecified affecting left nondominant side: Secondary | ICD-10-CM

## 2014-09-14 DIAGNOSIS — R29810 Facial weakness: Secondary | ICD-10-CM

## 2014-09-14 LAB — GLUCOSE, CAPILLARY
GLUCOSE-CAPILLARY: 168 mg/dL — AB (ref 70–99)
Glucose-Capillary: 175 mg/dL — ABNORMAL HIGH (ref 70–99)
Glucose-Capillary: 206 mg/dL — ABNORMAL HIGH (ref 70–99)
Glucose-Capillary: 165 mg/dL — ABNORMAL HIGH (ref 70–99)

## 2014-09-14 MED ORDER — INSULIN ASPART 100 UNIT/ML ~~LOC~~ SOLN
0.0000 [IU] | Freq: Three times a day (TID) | SUBCUTANEOUS | Status: DC
  Administered 2014-09-14: 2 [IU] via SUBCUTANEOUS
  Administered 2014-09-15: 5 [IU] via SUBCUTANEOUS
  Administered 2014-09-15: 3 [IU] via SUBCUTANEOUS
  Administered 2014-09-15: 8 [IU] via SUBCUTANEOUS
  Administered 2014-09-16 (×2): 5 [IU] via SUBCUTANEOUS

## 2014-09-14 MED ORDER — LISINOPRIL 20 MG PO TABS
40.0000 mg | ORAL_TABLET | Freq: Every day | ORAL | Status: DC
  Administered 2014-09-14 – 2014-09-15 (×2): 40 mg via ORAL
  Filled 2014-09-14 (×2): qty 2

## 2014-09-14 MED ORDER — CLOPIDOGREL BISULFATE 75 MG PO TABS
75.0000 mg | ORAL_TABLET | Freq: Every day | ORAL | Status: DC
  Administered 2014-09-14 – 2014-09-16 (×3): 75 mg via ORAL
  Filled 2014-09-14 (×3): qty 1

## 2014-09-14 MED ORDER — ASPIRIN 325 MG PO TABS
325.0000 mg | ORAL_TABLET | Freq: Every day | ORAL | Status: DC
  Administered 2014-09-14 – 2014-09-16 (×3): 325 mg via ORAL
  Filled 2014-09-14 (×3): qty 1

## 2014-09-14 MED ORDER — ROSUVASTATIN CALCIUM 10 MG PO TABS
10.0000 mg | ORAL_TABLET | Freq: Every day | ORAL | Status: DC
  Administered 2014-09-15: 10 mg via ORAL
  Filled 2014-09-14 (×4): qty 1

## 2014-09-14 NOTE — Progress Notes (Signed)
STROKE TEAM PROGRESS NOTE   HISTORY Brian Hall is an 59 y.o. male presenting to hospital after prolonged period of time which he had dysarthria and left sided weakness. Patient states around 2000 hours last night 09/11/2013 (LKW) he stood up and noted he was weaker on his left leg and noted slurred speech. His wife asked him to go to the hospital but patient did not want to go. Later that night he went to the bathroom and fell due to his left leg not strong enough to support him. He went to sleep and when awaking this AM with continued symptoms he called EMS. Patient does take a 81 mg ASA daily and has not missed any doses. He denies ETOH or tobacco.  Patient was not administered TPA secondary to delay in arrival. He was admitted for further evaluation and treatment.   SUBJECTIVE (INTERVAL HISTORY) Wife at bedside. CT with no changes. CD confirms occlusion.   OBJECTIVE Temp:  [97.9 F (36.6 C)-100.2 F (37.9 C)] 98.3 F (36.8 C) (01/13 0600) Pulse Rate:  [67-76] 68 (01/13 0600) Cardiac Rhythm:  [-] Normal sinus rhythm (01/13 0900) Resp:  [16-20] 18 (01/13 0600) BP: (115-141)/(61-91) 130/61 mmHg (01/13 0600) SpO2:  [97 %-100 %] 98 % (01/13 0600)   Recent Labs Lab 09/13/14 0648 09/13/14 1134 09/13/14 1631 09/13/14 2150 09/14/14 0648  GLUCAP 156* 163* 189* 151* 175*    Recent Labs Lab 09/12/14 1041 09/12/14 1058  NA 137 139  K 4.5 4.6  CL 109 107  CO2 23  --   GLUCOSE 196* 198*  BUN 28* 35*  CREATININE 1.31 1.20  CALCIUM 10.0  --     Recent Labs Lab 09/12/14 1041  AST 31  ALT 26  ALKPHOS 55  BILITOT 1.0  PROT 7.4  ALBUMIN 4.1    Recent Labs Lab 09/12/14 1041 09/12/14 1058 09/13/14 0530  WBC 12.4*  --  11.6*  NEUTROABS 9.6*  --   --   HGB 15.5 16.7 14.9  HCT 44.4 49.0 43.9  MCV 90.4  --  94.2  PLT 167  --  155   No results for input(s): CKTOTAL, CKMB, CKMBINDEX, TROPONINI in the last 168 hours.  Recent Labs  09/12/14 1041  LABPROT 12.8   INR 0.95    Recent Labs  09/12/14 1300  COLORURINE YELLOW  LABSPEC 1.021  PHURINE 5.0  GLUCOSEU 100*  HGBUR NEGATIVE  BILIRUBINUR NEGATIVE  KETONESUR NEGATIVE  PROTEINUR 100*  UROBILINOGEN 0.2  NITRITE NEGATIVE  LEUKOCYTESUR NEGATIVE       Component Value Date/Time   CHOL 174 09/12/2014 1041   TRIG 271* 09/12/2014 1041   HDL 32* 09/12/2014 1041   CHOLHDL 5.4 09/12/2014 1041   VLDL 54* 09/12/2014 1041   LDLCALC 88 09/12/2014 1041   Lab Results  Component Value Date   HGBA1C 7.7* 09/12/2014   No results found for: LABOPIA, COCAINSCRNUR, LABBENZ, AMPHETMU, THCU, LABBARB  No results for input(s): ETH in the last 168 hours.  Ct Head Wo Contrast  09/14/2014   CLINICAL DATA:  LEFT arm weakness has become worse. Symptoms began 09/12/14.  EXAM: CT HEAD WITHOUT CONTRAST  TECHNIQUE: Contiguous axial images were obtained from the base of the skull through the vertex without intravenous contrast.  COMPARISON:  CT head 09/12/2014.  MRI and MRA 09/13/2014.  FINDINGS: Increasing well-defined areas of cytotoxic edema throughout the RIGHT frontal, RIGHT temporal, and RIGHT insular regions also involving the periventricular white matter. There is a larger area of ischemia  involving the RIGHT posterior frontal cortex compared with the exam of 09/12/2014 which may correlate with worsening symptoms. Increased cytotoxic edema involvement of the RIGHT insula as well. No frank lobar hemorrhage although difficult to exclude petechial transformation. No midline shift. Hyperdense RIGHT ICA terminus. Chronic LEFT occipital infarct is stable.  IMPRESSION: Compared with the original CT, there are larger areas of acute infarction observed which may correlate with increasing LEFT arm weakness. No frank lobar hemorrhage or midline shift.   Electronically Signed   By: Rolla Flatten M.D.   On: 09/14/2014 08:02   Ct Head (brain) Wo Contrast  09/12/2014   CLINICAL DATA:  Left arm weakness, onset at 8 a.m. today.  History of throat cancer.  EXAM: CT HEAD WITHOUT CONTRAST  TECHNIQUE: Contiguous axial images were obtained from the base of the skull through the vertex without intravenous contrast.  COMPARISON:  None.  FINDINGS: There is immediate low-attenuation in the right middle cerebral artery territory, involving predominantly the right frontal and right parietal lobes. Remote infarcts are seen in the left occipital lobe and left cerebellar hemisphere. No evidence of acute hemorrhage, mass lesion, mass effect or hydrocephalus.  No air-fluid levels in the visualized portions of the paranasal sinuses or mastoid air cells.  IMPRESSION: 1. 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 2. Remote left occipital and left cerebellar infarcts.   Electronically Signed   By: Lorin Picket M.D.   On: 09/12/2014 12:10   Brian Jodene Nam Head Wo Contrast  09/13/2014   CLINICAL DATA:  Acute left-sided weakness. Evaluate for stroke. Initial evaluation.  EXAM: MRI HEAD WITHOUT CONTRAST  MRA HEAD WITHOUT CONTRAST  TECHNIQUE: Multiplanar, multiecho pulse sequences of the brain and surrounding structures were obtained without intravenous contrast. Angiographic images of the head were obtained using MRA technique without contrast.  COMPARISON:  Prior CT from 09/12/2014.  FINDINGS: MRI HEAD FINDINGS  Diffuse prominence of the CSF containing spaces is compatible with generalized cerebral atrophy. Minimal T2/FLAIR hyperintensity within the periventricular white matter noted, most consistent with chronic small vessel ischemic disease. Encephalomalacia within the medial left occipital lobe compatible with remote left PCA territory infarct. There are scattered remote left cerebellar infarcts.  Extensive restricted diffusion is seen involving the right frontal lobe, compatible with acute right MCA territory infarct. There is associated gyral swelling with edema in the area of infarct without significant mass effect. Probable minimal petechial  hemorrhage seen within this region on gradient echo sequence without frank hemorrhagic conversion. No involvement of the right basal ganglia. No other acute intracranial infarct. Gray-white matter differentiation otherwise maintained.  No mass lesion or midline shift. No hydrocephalus. No extra-axial fluid collection.  No acute abnormality seen about the orbits.  Pituitary gland within normal limits.  Mild cerebellar tonsillar ectopia L4-5 mm noted at the craniocervical junction. Visualized upper cervical spine within normal limits.  Visualized bone marrow signal intensity normal.  Paranasal sinuses are clear. Scattered fluid signal intensity noted within the mastoid air cells bilaterally, right greater than left.  MRA HEAD FINDINGS  ANTERIOR CIRCULATION:  Study is degraded by motion artifact.  The distal right internal carotid artery appears to be occluded. No flow seen within the petrous or cavernous segments of the right ICA is well. There is some distal reconstitution at the right ICA terminus via flow from the contralateral left ICA system across the circle of Willis. Markedly diminutive an attenuated flow seen within the right M1 segment and distal right MCA branches.  Visualized distal aspect of  the cervical left ICA widely patent with antegrade flow. The petrous, cavernous, and supra clinoid segments patent without hemodynamically significant stenosis. Left A1 segment and right A1 segment well opacified. The left A2 segment is well opacified. The right A2 segment is markedly diminutive in not well visualized. This may be due to occlusion, high-grade stenosis, or poor flow due to the occluded right ICA.  Left M1 segment well opacified. Atherosclerotic irregularity noted within the distal left MCA branches.  POSTERIOR CIRCULATION:  Dominant. There is a focal severe stenosis within the distal left vertebral artery prior to the loop vertebrobasilar junction. Posterior inferior cerebral artery is not visualized  on this exam. Mild multi focal atherosclerotic irregularity present within the basilar artery which remains widely patent. Superior cerebellar arteries are patent proximally, but not well visualized distally.  Right P1 and P2 segments are patent without significant stenosis. The left P1 and proximal P2 segments appear widely patent. The distal left P2 segment is not visualized, and may be occluded.  No aneurysm or vascular malformation.  IMPRESSION: MRI HEAD IMPRESSION:  1. Acute large territory right MCA territory infarct involving the right frontal lobe without significant mass effect. 2. Remote infarcts involving the medial left occipital lobe and left cerebellar hemisphere. 3. Generalized cerebral atrophy with mild chronic small vessel ischemic disease.  MRA HEAD IMPRESSION:  1. Occluded right internal carotid artery. There is some distal reconstitution at the right ICA terminus via flow across the circle Willis from the contralateral left ICA system. Flow within the right M1 segment and distal right MCA branches is markedly attenuated, and likely inadequate. 2. Poor visualization of the right A2 segment, likely due to occlusion of the right ICA with limited flow across the circle of Willis. 3. Nonvisualization of the mid -distal left PCA, likely occluded given the presence of remote left occipital infarct. 4. Focal short segment severe stenosis within the distal left vertebral artery. The right vertebral artery is dominant and widely patent.   Electronically Signed   By: Jeannine Boga M.D.   On: 09/13/2014 05:13   Brian Brain Wo Contrast  09/13/2014   CLINICAL DATA:  Acute left-sided weakness. Evaluate for stroke. Initial evaluation.  EXAM: MRI HEAD WITHOUT CONTRAST  MRA HEAD WITHOUT CONTRAST  TECHNIQUE: Multiplanar, multiecho pulse sequences of the brain and surrounding structures were obtained without intravenous contrast. Angiographic images of the head were obtained using MRA technique without  contrast.  COMPARISON:  Prior CT from 09/12/2014.  FINDINGS: MRI HEAD FINDINGS  Diffuse prominence of the CSF containing spaces is compatible with generalized cerebral atrophy. Minimal T2/FLAIR hyperintensity within the periventricular white matter noted, most consistent with chronic small vessel ischemic disease. Encephalomalacia within the medial left occipital lobe compatible with remote left PCA territory infarct. There are scattered remote left cerebellar infarcts.  Extensive restricted diffusion is seen involving the right frontal lobe, compatible with acute right MCA territory infarct. There is associated gyral swelling with edema in the area of infarct without significant mass effect. Probable minimal petechial hemorrhage seen within this region on gradient echo sequence without frank hemorrhagic conversion. No involvement of the right basal ganglia. No other acute intracranial infarct. Gray-white matter differentiation otherwise maintained.  No mass lesion or midline shift. No hydrocephalus. No extra-axial fluid collection.  No acute abnormality seen about the orbits.  Pituitary gland within normal limits.  Mild cerebellar tonsillar ectopia L4-5 mm noted at the craniocervical junction. Visualized upper cervical spine within normal limits.  Visualized bone marrow signal intensity normal.  Paranasal sinuses are clear. Scattered fluid signal intensity noted within the mastoid air cells bilaterally, right greater than left.  MRA HEAD FINDINGS  ANTERIOR CIRCULATION:  Study is degraded by motion artifact.  The distal right internal carotid artery appears to be occluded. No flow seen within the petrous or cavernous segments of the right ICA is well. There is some distal reconstitution at the right ICA terminus via flow from the contralateral left ICA system across the circle of Willis. Markedly diminutive an attenuated flow seen within the right M1 segment and distal right MCA branches.  Visualized distal aspect  of the cervical left ICA widely patent with antegrade flow. The petrous, cavernous, and supra clinoid segments patent without hemodynamically significant stenosis. Left A1 segment and right A1 segment well opacified. The left A2 segment is well opacified. The right A2 segment is markedly diminutive in not well visualized. This may be due to occlusion, high-grade stenosis, or poor flow due to the occluded right ICA.  Left M1 segment well opacified. Atherosclerotic irregularity noted within the distal left MCA branches.  POSTERIOR CIRCULATION:  Dominant. There is a focal severe stenosis within the distal left vertebral artery prior to the loop vertebrobasilar junction. Posterior inferior cerebral artery is not visualized on this exam. Mild multi focal atherosclerotic irregularity present within the basilar artery which remains widely patent. Superior cerebellar arteries are patent proximally, but not well visualized distally.  Right P1 and P2 segments are patent without significant stenosis. The left P1 and proximal P2 segments appear widely patent. The distal left P2 segment is not visualized, and may be occluded.  No aneurysm or vascular malformation.  IMPRESSION: MRI HEAD IMPRESSION:  1. Acute large territory right MCA territory infarct involving the right frontal lobe without significant mass effect. 2. Remote infarcts involving the medial left occipital lobe and left cerebellar hemisphere. 3. Generalized cerebral atrophy with mild chronic small vessel ischemic disease.  MRA HEAD IMPRESSION:  1. Occluded right internal carotid artery. There is some distal reconstitution at the right ICA terminus via flow across the circle Willis from the contralateral left ICA system. Flow within the right M1 segment and distal right MCA branches is markedly attenuated, and likely inadequate. 2. Poor visualization of the right A2 segment, likely due to occlusion of the right ICA with limited flow across the circle of Willis. 3.  Nonvisualization of the mid -distal left PCA, likely occluded given the presence of remote left occipital infarct. 4. Focal short segment severe stenosis within the distal left vertebral artery. The right vertebral artery is dominant and widely patent.   Electronically Signed   By: Jeannine Boga M.D.   On: 09/13/2014 05:13   2D Echocardiogram  EF 30-35% with no source of embolus. Diffuse hypokinesis.  Carotid Doppler  Preliminary findings: Right = occlusion of the right ICA . Left = 1-39% ICA stenosis. Appears to be bilateral vertebral occlusion.   PHYSICAL EXAM Obese young Caucasian male not in distress.Awake alert. Afebrile. Head is nontraumatic. Neck shows deformity and old surgical scar from thyroid cancer surgery and radiation on the right. Hearing is normal. Cardiac exam no murmur or gallop. Lungs are clear to auscultation. Distal pulses are well felt. Neurological Exam : Awake alert oriented 2. Diminished attention, registration and recall. Right gaze preference but able to look to the left past midline. Blinks to threat on the right and partially blinks on the left. Left lower facial weakness. Tongue is midline. Left upper extremity 0/5 strength. Left lower extremity 2/5.  Decreased tone on the left. Mild left hemisensory loss. No neglect. Left plantar upgoing right downgoing. Gait was not tested.    ASSESSMENT/PLAN Brian. SULAIMAN Hall is a 59 y.o. male with history of DMII, HTN, hyperlipidemia, hypothyroidism, and throat cancer  presenting with dysarthria and left side weakness. He did not receive IV t-PA due to delay in arrival.   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 Given large vessel intracranial atherosclerosis, patient should be treated with aspirin 81 mg and clopidogrel 75 mg orally every day x 3 months for secondary stroke prevention. After 3 months, change to plavix alone. Long-term dual antiplatelets are contraindicated due to risk for intracerebral hemorrhage.   Ongoing aggressive stroke risk factor management  No indication to move to ICU at this time. Will repeat CT head in am to check for increasing edema  Therapy recommendations:  pending   Disposition:  pending  Hypertension Permissive hypertension (OK if < 220/120) but gradually normalize in 5-7 days  Stable  Hyperlipidemia  Home meds:  pravachol 40 resumed in hospital  LDL 88, goal < 70  Continue statin at discharge  Diabetes  HgbA1c 7.7 goal < 7.0  Uncontrolled  Other Stroke Risk Factors  Obesity, Body mass index is 34.88 kg/(m^2).   Hx obstructive sleep apnea, resolved after tonsils were removed.   Other Active Problems  Hx CHF, known low EF 10-20%  Renal insufficiency  Other pertinent history  Hx throat cancer, s/p radiation   Start dual antiplatelets. NO FURTHER STROKE WORKUP INDICATED  Patient has a 10-15% risk of having another stroke over the next year, the highest risk is within 2 weeks of the most recent stroke/TIA (risk of having a stroke following a stroke or TIA is the same).  Ongoing risk factor control by Primary Care Physician  Stroke Service will sign off. Please call should any needs arise.  Follow up with Dr. Antony Contras, stroke clinic at [facility] in 1 monts, order placed.  Hospital day # [place] for Pager information 09/14/2014 9:58 AM  I have personally examined this patient, reviewed notes, independently viewed imaging studies, participated in medical decision making and plan of care. I have made any additions or clarifications directly to the above note. Agree with note above. Recommend aspirin and Plavix for 3 months followed by Plavix alone. Aggressive risk factor modification. Follow-up as an outpatient in stroke clinic in 2 months or call earlier if necessary. Stroke team will sign off. Currently call for questions. Antony Contras, MD Medical Director [facility] Pager: [phone] 09/14/2014 4:04 PM    To contact Stroke Continuity provider, please refer to [email or web address] After hours, contact General Neurology

## 2014-09-14 NOTE — Progress Notes (Signed)
Subjective:    No events overnight. Patient noticed no new symptoms. No new headaches, no changes in vision.   Objective:    Vital Signs:   Temp:  [97.9 F (36.6 C)-100.2 F (37.9 C)] 98.2 F (36.8 C) (01/13 1407) Pulse Rate:  [67-76] 70 (01/13 1407) Resp:  [16-20] 20 (01/13 1407) BP: (115-164)/(61-94) 164/77 mmHg (01/13 1407) SpO2:  [96 %-100 %] 97 % (01/13 1407) Last BM Date: 09/13/14  24-hour weight change: Weight change:   Intake/Output:   Intake/Output Summary (Last 24 hours) at 09/14/14 1414 Last data filed at 09/13/14 2100  Gross per 24 hour  Intake      0 ml  Output   1050 ml  Net  -1050 ml      Physical Exam: General: Obese gentleman, lying in bed, no acute distress, responding appropriately to questions HEENT: PERRLA, EOMI, no gross visual field defecits, oropharynx is moist, no erythema or lesion PULM: Lungs clear to auscultation bilaterally, normal work of breathing CV: Heart sounds distant, normal S1/S2, no murmurs, rubs, or gallops ABD: Soft, non-tender, non-distended EXT: No clubbing, cyanosis, or edema NEURO: CNII-IV, VI normal as above, CN V sensation grossly intact,  facial droop present on the left, left lower CN VII weakness noted, CNVIII-XII grossly normal. UE strength is 0/5 on the left, 5/5 on the right, UE sensation intact bilaterally, LE strength is 4/5 on the left, 5/5 on the right, light touch sensation is markedly reduced on the left, normal on the right.   Labs: No new labs overnight.   Imaging:  Ct Head Wo Contrast  09/14/2014   CLINICAL DATA:  LEFT arm weakness has become worse. Symptoms began 09/12/14.  EXAM: CT HEAD WITHOUT CONTRAST  TECHNIQUE: Contiguous axial images were obtained from the base of the skull through the vertex without intravenous contrast.  COMPARISON:  CT head 09/12/2014.  MRI and MRA 09/13/2014.  FINDINGS: Increasing well-defined areas of cytotoxic edema throughout the RIGHT frontal, RIGHT temporal, and RIGHT insular  regions also involving the periventricular white matter. There is a larger area of ischemia involving the RIGHT posterior frontal cortex compared with the exam of 09/12/2014 which may correlate with worsening symptoms. Increased cytotoxic edema involvement of the RIGHT insula as well. No frank lobar hemorrhage although difficult to exclude petechial transformation. No midline shift. Hyperdense RIGHT ICA terminus. Chronic LEFT occipital infarct is stable.  IMPRESSION: Compared with the original CT, there are larger areas of acute infarction observed which may correlate with increasing LEFT arm weakness. No frank lobar hemorrhage or midline shift.   Electronically Signed   By: Rolla Flatten M.D.   On: 09/14/2014 08:02     Medications:    Infusions:    Scheduled Medications: . aspirin  325 mg Oral Daily  . clopidogrel  75 mg Oral Daily  . heparin  5,000 Units Subcutaneous 3 times per day  . hydrALAZINE  5 mg Intravenous Once  . insulin aspart  0-9 Units Subcutaneous TID WC  . levothyroxine  88 mcg Oral QAC breakfast  . pantoprazole  80 mg Oral Daily  . pravastatin  40 mg Oral Daily    PRN Medications: acetaminophen **OR** acetaminophen   Assessment/ Plan:    Pt is a 59 y.o. yo male with a PMHx of DMII, HTN, HLD, and ? CHF who presents with slurred speech and left sided weakness found to have large right sided MCA stroke.   Right sided MCA stroke - Patient presented outside the window for  TPA therapy. MRA shows complete occlusion of right ICA, possibly secondary to previous surgery and radiation. Repeat CT today with slightly increased stroke size, but no mass effect or midline shift. Exam slightly worse today with decreased sensation on the left.  - continue q2h neuro checks, low threshold for increased level of care 2/2 size of stroke - continue ASA 325, per neuro will start clopidogrel today - HgbA1c 7.7, lipid panel notable for a triglyceride level of 252, risk foster modification as  below - echo results as below - carotid confirms R ICA occlusion, but per neuro no need for endarterectomy at this time  - PT/OT has seen patient and recommend CIR for dispo  HTN: holding home meds, prn hydralazine for BPs > 220/110  DMII: Holding home metformin/Januvia, currently on SSI - HgbA1c 7.7% - further glucose control per PCP  Lipids: continue home pravastatin 40 mg PO - lipid panel notable for Trig of 252  History of CHF: Repeat echo with EF of ~30%, diffuse hypokinesis, but no evidence of thrombus. Clinically no signs of CHF, recommend follow up as an outpatient.   Code status- FULL  VTE PPX- Heparin  Dispo: Disposition is deferred at this time, awaiting CIR placement. Anticipated discharge in approximately 1-2 day(s).   The patient does have a current PCP (No primary care provider on file.) and does need an [facility] hospital follow-up appointment after discharge.  Length of Stay: 2 day(s)   Signed: Crecencio Mc, Med Student  Pager: [phone] (7AM-5PM) 09/14/2014, 2:14 PM

## 2014-09-14 NOTE — Evaluation (Signed)
Occupational Therapy Evaluation Patient Details Name: Brian Hall MRN: [id number] DOB: [birth year] Today's Date: 09/14/2014    History of Present Illness Brian Hall is a 59yo man with pMH of HTN, DM, obesity who presented with weakness on the left and slurred speech that started prior to going to bed and was worse on awakening.  He was brought to the ED where he was found to have weakness on the left and a facial droop, left weakness worsened night of admission, 09/12/14, UE>LE.  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 Patient independent PTA. Patient currently functioning up to a mod/max +2 assist level. Please see OT problem list below. Feel patient will benefit from acute OT to increase overall independence in the areas of ADLs & functional mobility and in order to safely discharge to venue listed below.      Follow Up Recommendations  CIR;Supervision/Assistance - 24 hour    Equipment Recommendations   (defer to next venue)    Recommendations for Other Services Rehab consult     Precautions / Restrictions Precautions Precautions: Fall Restrictions Weight Bearing Restrictions: No      Mobility - Per PT evaluation  Bed Mobility Overal bed mobility: Needs Assistance Bed Mobility: Supine to Sit     Supine to sit: Mod assist     General bed mobility comments: pt attempted to get to EOB independently x3 mins but was unable, with vc's and min A, pt was able to roll to right bringing left arm with him and use RLE to assist LLE off bed. Mod A to elevate trunk to achieve upright position. Pt able to wt shift without LOB to get to EOB.  Transfers Overall transfer level: Needs assistance   Transfers: Squat Pivot Transfers     Squat pivot transfers: Mod assist;+2 safety/equipment     General transfer comment: pt performed squat pivot to  right with mod A, +2 behind pt for safety. Pt educated in safety concerns and need for going to right with transfers at this time. Pt able to sufficiently use left side body to pull self into chair but mod A for posterior motion into chair    Balance - Per PT evaluation  Overall balance assessment: Needs assistance Sitting-balance support: Single extremity supported;Feet supported Sitting balance-Leahy Scale: Fair Sitting balance - Comments: can maintain sitting without holding on with RUE but cannot accept challenges to left. Able to wt shift to right for scooting but not left Postural control: Right lateral lean     ADL Overall ADL's : Needs assistance/impaired Eating/Feeding: Minimal assistance   Grooming: Minimal assistance   Upper Body Bathing: Moderate assistance   Lower Body Bathing: Maximal assistance;+2 for physical assistance   Upper Body Dressing : Maximal assistance   Lower Body Dressing: +2 for physical assistance;Maximal assistance   Toilet Transfer: +2 for physical assistance;Maximal assistance           Functional mobility during ADLs: +2 for physical assistance General ADL Comments: Patient requires up to max +2 assist for functional tasks. Patient with flaccid left side. Patient became emotional during session and therapist provided psychosocial support. Patient wife and step-son present also providing support. Patient will benefit from CIR to increase patient's overall independence and overall quality of life.      Vision Additional Comments: Patient with difficult time tracking and with poor ROM. 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   Lower Extremity Assessment Lower Extremity Assessment: LLE deficits/detail LLE Deficits / Details: ankle 0/5, knee flex/ext 2-/5, hip flex 2-/5, has adduction capability LLE Coordination: decreased fine motor;decreased gross motor   Cervical / Trunk Assessment Cervical / Trunk Assessment: Normal   Communication Communication Communication: No difficulties   Cognition Arousal/Alertness: Awake/alert Behavior During Therapy:  (tearful throughout eval) Overall Cognitive Status: Within Functional Limits for tasks assessed             Home Living Family/patient expects to be discharged to:: Inpatient rehab Living Arrangements: Spouse/significant other Available Help at Discharge: Family;Available 24 hours/day Type of Home: Mobile home Home Access: Stairs to enter Entrance Stairs-Number of Steps: 4 Entrance Stairs-Rails: Right Home Layout: One level     Bathroom Shower/Tub: Walk-in shower;Door   [employer] Toilet: Standard Bathroom Accessibility: Yes How Accessible: Accessible via walker Home Equipment: None          Prior Functioning/Environment Level of Independence: Independent        Comments: pt wants to be able to ride his motorcycle    OT Diagnosis: Generalized weakness;Disturbance of vision;Hemiplegia non-dominant side   OT Problem List: Decreased strength;Decreased range of motion;Decreased activity tolerance;Impaired balance (sitting and/or standing);Impaired vision/perception;Decreased coordination;Decreased safety awareness;Decreased knowledge of use of DME or AE;Decreased knowledge of precautions;Impaired sensation;Impaired UE functional use;Pain   OT Treatment/Interventions: Self-care/ADL training;Therapeutic exercise;Energy conservation;DME and/or AE instruction;Splinting;Therapeutic activities;Visual/perceptual remediation/compensation;Patient/family education;Balance training;Neuromuscular  education    OT Goals(Current goals can be found in the care plan section) Acute Rehab OT Goals Patient Stated Goal: get back on his motorcycle OT Goal Formulation: With patient/family Time For Goal Achievement: 09/28/14 Potential to Achieve Goals: Good ADL Goals Pt Will Perform Grooming: with modified independence;sitting Pt Will Perform Upper Body Bathing: with set-up;sitting Pt Will Perform Lower Body Bathing: with min assist;sit to/from stand;with adaptive equipment Pt Will Perform Upper Body Dressing: with set-up;sitting Pt Will Perform Lower Body Dressing: with min assist;sit to/from stand;with adaptive equipment Pt Will Transfer to Toilet: with min assist;stand pivot transfer;bedside commode Pt Will Perform Toileting - Clothing Manipulation and hygiene: with min assist;sit to/from stand Pt Will Perform Tub/Shower Transfer: Shower transfer;with min assist;Stand pivot transfer;shower seat Pt/caregiver will Perform Home Exercise Program: Left upper extremity;Increased strength;With written HEP provided Additional ADL Goal #1: Patient will independently attend to Left side for safety during functional tasks and functional mobility  OT Frequency: Min 3X/week   Barriers to D/C: None known at this time. Wife aware of potential need for ramp and to make home w/c accessible.           End of Session Equipment Utilized During Treatment: Gait belt  Activity Tolerance: Patient tolerated treatment well Patient left: in chair;with family/visitor present;with call bell/phone within reach   Time: 1325-1355 OT Time Calculation (min): 30 min Charges:  OT General Charges $OT Visit: 1 Procedure OT Evaluation $Initial OT Evaluation Tier I: 1 Procedure OT Treatments $Self Care/Home Management : 8-22 mins  Delani Kohli , MS, OTR/L, CLT  Pager: [phone]  09/14/2014, 2:05 PM

## 2014-09-14 NOTE — Progress Notes (Signed)
  Date: 09/14/2014  Patient name: Brian Hall  Medical record number: [id number]  Date of birth: 1955-10-16   This patient's plan of care was discussed with the house staff. Please see Dr. Talmadge Coventry note for complete details (incomplete at time of this note).   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 CT of the head without frank lobar hemorrhage or midline shift.  He will be evaluated for CIR.     [name], MD 09/14/2014, 3:30 PM

## 2014-09-14 NOTE — Evaluation (Signed)
Physical Therapy Evaluation Patient Details Name: Brian Hall MRN: [id number] DOB: 1956/06/09 Today's Date: 09/14/2014   History of Present Illness  Brian Hall is a 59yo man with pMH of HTN, DM, obesity who presented with weakness on the left and slurred speech that started prior to going to bed and was worse on awakening.  He was brought to the ED where he was found to have weakness on the left and a facial droop, left weakness worsened night of admission, 09/12/14, UE>LE.  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 Pt currently with functional limitations due to the deficits listed below (see PT Problem List). Pt requires +2 assist for safe transfers currently, unable to ambulate. Pt very motivated to work with therapy. Tearful about situation on eval.  Pt will benefit from skilled PT to increase their independence and safety with mobility to allow discharge to the venue listed below.       Follow Up Recommendations CIR    Equipment Recommendations  Other (comment) (TBD)    Recommendations for Other Services Rehab consult     Precautions / Restrictions Precautions Precautions: Fall Restrictions Weight Bearing Restrictions: No      Mobility  Bed Mobility Overal bed mobility: Needs Assistance Bed Mobility: Supine to Sit     Supine to sit: Mod assist     General bed mobility comments: pt attempted to get to EOB independently x3 mins but was unable, with vc's and min A, pt was able to roll to right bringing left arm with him and use RLE to assist LLE off bed. Mod A to elevate trunk to achieve upright position. Pt able to wt shift without LOB to get to EOB.  Transfers Overall transfer level: Needs assistance   Transfers: Squat Pivot Transfers     Squat pivot transfers: Mod assist;+2 safety/equipment     General transfer comment: pt  performed squat pivot to right with mod A, +2 behind pt for safety. Pt educated in safety concerns and need for going to right with transfers at this time. Pt able to sufficiently use left side body to pull self into chair but mod A for posterior motion into chair  Ambulation/Gait             General Gait Details: unable at this point  Stairs            Wheelchair Mobility    Modified Rankin (Stroke Patients Only) Modified Rankin (Stroke Patients Only) Pre-Morbid Rankin Score: No symptoms Modified Rankin: Severe disability     Balance Overall balance assessment: Needs assistance Sitting-balance support: Single extremity supported;Feet supported Sitting balance-Leahy Scale: Fair Sitting balance - Comments: can maintain sitting without holding on with RUE but cannot accept challenges to left. Able to wt shift to right for scooting but not left Postural control: Right lateral lean                                   Pertinent Vitals/Pain Pain Assessment: No/denies pain  VSS    Home Living Family/patient expects to be discharged to:: Inpatient rehab Living Arrangements: Spouse/significant other Available Help at Discharge: Family;Available 24 hours/day Type of Home: Mobile home Home Access: Stairs to enter Entrance Stairs-Rails: Right Entrance Stairs-Number of Steps: 4 Home Layout: One level Home Equipment: None  Prior Function Level of Independence: Independent         Comments: pt wants to be able to ride his motorcycle     Hand Dominance   Dominant Hand: Right    Extremity/Trunk Assessment   Upper Extremity Assessment: Defer to OT evaluation       LUE Deficits / Details: 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 Very motivated to participate with therapy    Exercises General Exercises - Lower Extremity Ankle Circles/Pumps: AROM;Both;10 reps;Seated (attempted left) Long Arc Quad: AROM;Both;10 reps;Seated (attempted left)      Assessment/Plan    PT Assessment Patient needs continued PT services  PT Diagnosis Difficulty walking;Hemiplegia non-dominant side   PT Problem List Decreased strength;Decreased range of motion;Decreased activity tolerance;Decreased balance;Decreased mobility;Decreased coordination;Decreased knowledge of use of DME;Decreased safety awareness;Decreased knowledge of precautions;Impaired sensation  PT Treatment Interventions DME instruction;Gait training;Functional mobility training;Therapeutic activities;Therapeutic exercise;Balance training;Neuromuscular re-education;Patient/family education   PT Goals (Current goals can be found in the Care Plan section) Acute Rehab PT Goals Patient Stated Goal: get back on his motorcycle PT Goal Formulation: With patient Time For Goal Achievement: 09/28/14 Potential to Achieve Goals: Fair    Frequency Min 4X/week   Barriers to discharge Decreased caregiver support wife works    Co-evaluation               End of Session Equipment Utilized During Treatment: Gait belt Activity Tolerance: Patient tolerated treatment well Patient left: in chair;with call bell/phone within reach;with chair alarm set;with family/visitor present Nurse Communication: Mobility status         Time: 7078-6754 PT Time Calculation (min) (ACUTE ONLY): 29  min   Charges:   PT Evaluation $Initial PT Evaluation Tier I: 1 Procedure PT Treatments $Therapeutic Activity: 23-37 mins   PT G Codes:       Leighton Roach, PT  Acute Rehab Services  [phone]  Leighton Roach 09/14/2014, 2:01 PM

## 2014-09-14 NOTE — Progress Notes (Signed)
Rehab Admissions Coordinator Note:  Patient was screened by [name] for appropriateness for an Inpatient Acute Rehab Consult per therapy recommendations. At this time, we are recommending Inpatient Rehab consult.  [name] 09/14/2014, 3:37 PM  I can be reached at [phone].

## 2014-09-15 DIAGNOSIS — G8190 Hemiplegia, unspecified affecting unspecified side: Secondary | ICD-10-CM

## 2014-09-15 LAB — BASIC METABOLIC PANEL
Anion gap: 13 (ref 5–15)
BUN: 46 mg/dL — ABNORMAL HIGH (ref 6–23)
CHLORIDE: 94 meq/L — AB (ref 96–112)
CO2: 27 mmol/L (ref 19–32)
Calcium: 9.7 mg/dL (ref 8.4–10.5)
Creatinine, Ser: 1.59 mg/dL — ABNORMAL HIGH (ref 0.50–1.35)
GFR calc Af Amer: 54 mL/min — ABNORMAL LOW (ref >90)
GFR, EST NON AFRICAN AMERICAN: 47 mL/min — AB (ref >90)
Glucose, Bld: 163 mg/dL — ABNORMAL HIGH (ref 70–99)
Potassium: 4.6 mmol/L (ref 3.5–5.1)
Sodium: 134 mmol/L — ABNORMAL LOW (ref 135–145)

## 2014-09-15 LAB — GLUCOSE, CAPILLARY
GLUCOSE-CAPILLARY: 185 mg/dL — AB (ref 70–99)
Glucose-Capillary: 253 mg/dL — ABNORMAL HIGH (ref 70–99)
Glucose-Capillary: 240 mg/dL — ABNORMAL HIGH (ref 70–99)
Glucose-Capillary: 200 mg/dL — ABNORMAL HIGH (ref 70–99)

## 2014-09-15 LAB — MAGNESIUM: Magnesium: 2.7 mg/dL — ABNORMAL HIGH (ref 1.5–2.5)

## 2014-09-15 LAB — TROPONIN I: Troponin I: <0.03 ng/mL (ref <0.031)

## 2014-09-15 MED ORDER — LISINOPRIL 20 MG PO TABS
20.0000 mg | ORAL_TABLET | Freq: Every day | ORAL | Status: DC
  Administered 2014-09-16: 20 mg via ORAL
  Filled 2014-09-15: qty 1

## 2014-09-15 NOTE — Progress Notes (Addendum)
CCMD notified RN of patient having a 6 beat run of V-Tach. Patient asymptomatic upon assessment. On-call MD paged, new orders received. Will continue to monitor patient closely. Burnell Blanks, RN

## 2014-09-15 NOTE — Consult Note (Signed)
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 right handed male with history of hypertension, diastolic congestive heart failure, diabetes mellitus and peripheral neuropathy and throat cancer with neck dissection and radiation chemotherapy 2011. Patient lives with his wife and was independent prior to admission. Presented 09/12/2014 with left-sided weakness and slurred speech. MRI of the brain showed acute large territory right MCA infarct involving right frontal lobe as well as remote infarcts medial left occipital lobe and left cerebellar hemisphere. 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 Echocardiogram with ejection fraction of 35% no source of embolism. Carotid Dopplers with right ICA occlusion. Patient did not receive TPA. Neurology consulted and maintained on aspirin as well as Plavix for CVA prophylaxis 3 months then Plavix alone. Subcutaneous heparin for DVT prophylaxis. Patient is tolerating a regular consistency diet. Physical therapy evaluation completed 09/14/2014 with recommendations of physical medicine rehabilitation consult.   Review of Systems  Respiratory: Positive for shortness of breath.   Gastrointestinal: Positive for constipation.       GERD  Musculoskeletal: Positive for myalgias.  All other systems reviewed and are negative.  Past Medical History  Diagnosis Date  . Hypertension   . Diabetes mellitus   . Obesity   . CHF (congestive heart failure)   . SOB (shortness of breath)   . Hyperkalemia   . Renal insufficiency   . Cardiogenic shock   . Cardiac LV ejection fraction 10-20%   . Hyperlipemia   . Hypothyroidism   . Throat cancer 2011    s/p neck dissection, radiation, chemo   Past Surgical History  Procedure Laterality Date  . Cardiac catheterization    . Neck dissection Right 2011    s/p  resection of throat cancer with multiple nodes removed   Family History  Problem Relation Age of Onset  . Hypertension Mother   . Hypertension Father    Social History:  reports that he has never smoked. He does not have any smokeless tobacco history on file. He reports that he does not drink alcohol or use illicit drugs. Allergies: No Known Allergies Medications Prior to Admission  Medication Sig Dispense Refill  . aspirin EC 81 MG tablet Take 81 mg by mouth daily.    . carvedilol (COREG) 12.5 MG tablet Take 12.5 mg by mouth 2 (two) times daily.  0  . cholecalciferol (VITAMIN D) 400 UNITS TABS tablet Take 400 Units by mouth daily.    Marland Kitchen JANUVIA 100 MG tablet Take 100 mg by mouth daily.  0  . levothyroxine (SYNTHROID, LEVOTHROID) 88 MCG tablet Take 0.88 mcg by mouth daily.    Marland Kitchen lisinopril (PRINIVIL,ZESTRIL) 40 MG tablet Take 40 mg by mouth daily.    . metFORMIN (GLUCOPHAGE-XR) 750 MG 24 hr tablet Take 750 mg by mouth 2 (two) times daily.    . Multiple Vitamins-Minerals (MULTIVITAMIN WITH MINERALS) tablet Take 1 tablet by mouth daily.    Marland Kitchen omeprazole (PRILOSEC) 40 MG capsule Take 40 mg by mouth daily.    . pravastatin (PRAVACHOL) 40 MG tablet Take 40 mg by mouth daily.      Home: Home Living Family/patient expects to be discharged to:: Inpatient rehab Living Arrangements: Spouse/significant other Available Help at Discharge: Family, Available 24 hours/day Type of Home: Mobile home Home Access: Stairs to enter Entrance Stairs-Number of Steps: 4 Entrance Stairs-Rails: Right Home  Layout: One level Home Equipment: None  Functional History: Prior Function Level of Independence: Independent Comments: pt wants to be able to ride his motorcycle Functional Status:  Mobility: Bed Mobility Overal bed mobility: Needs Assistance Bed Mobility: Supine to Sit Supine to sit: Mod assist General bed mobility comments: pt attempted to get to EOB independently x3 mins but was unable, with vc's  and min A, pt was able to roll to right bringing left arm with him and use RLE to assist LLE off bed. Mod A to elevate trunk to achieve upright position. Pt able to wt shift without LOB to get to EOB. Transfers Overall transfer level: Needs assistance Transfers: Squat Pivot Transfers Squat pivot transfers: Mod assist, +2 safety/equipment General transfer comment: pt performed squat pivot to right with mod A, +2 behind pt for safety. Pt educated in safety concerns and need for going to right with transfers at this time. Pt able to sufficiently use left side body to pull self into chair but mod A for posterior motion into chair Ambulation/Gait General Gait Details: unable at this point    ADL: ADL Overall ADL's : Needs assistance/impaired Eating/Feeding: Minimal assistance Grooming: Minimal assistance Upper Body Bathing: Moderate assistance Lower Body Bathing: Maximal assistance, +2 for physical assistance Upper Body Dressing : Maximal assistance Lower Body Dressing: +2 for physical assistance, Maximal assistance Toilet Transfer: +2 for physical assistance, Maximal assistance Functional mobility during ADLs: +2 for physical assistance General ADL Comments: Patient requires up to max +2 assist for functional tasks. Patient with flaccid left side. Patient became emotional during session and therapist provided psychosocial support. Patient wife and step-son present also providing support. Patient will benefit from CIR to increase patient's overall independence and overall quality of life.   Cognition: Cognition Overall Cognitive Status: Within Functional Limits for tasks assessed Orientation Level: Oriented X4 Cognition Arousal/Alertness: Awake/alert Behavior During Therapy:  (tearful throughout eval) Overall Cognitive Status: Within Functional Limits for tasks assessed  Blood pressure 116/75, pulse 85, temperature 98.6 F (37 C), temperature source Oral, resp. rate 16, height 5\' 11"   (1.803 m), weight 113.399 kg (250 lb), SpO2 96 %. Physical Exam  Constitutional: He appears well-developed and well-nourished.  HENT:  Head: Normocephalic.  Right Ear: External ear normal.  Left Ear: External ear normal.  Eyes:  Pupils reactive to light  Neck: Normal range of motion. Neck supple. No tracheal deviation present. No thyromegaly present.  Cardiovascular: Normal rate and regular rhythm.   Respiratory: Effort normal and breath sounds normal. No respiratory distress.  GI: Soft. Bowel sounds are normal. He exhibits no distension.  Musculoskeletal: He exhibits no edema.  Neurological: He is alert.  Makes good eye contact with examiner. He does have a right gaze preference but will cross midline to the left with prompting. Follows commands. He is oriented to person place and date of birth. LUE: 1/5 at pec, bicep, tricep, trace to absent left hand and wrist. Left HE and KE 1 to 1+, trace movement at foot. Sensation 1/2 Left face,arm, leg. Mild left central 7 and tongue deviation.  Cognitively displays reasonable insight and awareness.  Skin: Skin is warm and dry.  Psychiatric: He has a normal mood and affect. His behavior is normal.    Results for orders placed or performed during the hospital encounter of 09/12/14 (from the past 24 hour(s))  Glucose, capillary     Status: Abnormal   Collection Time: 09/14/14  6:48 AM  Result Value Ref Range   Glucose-Capillary 175 (H)  70 - 99 mg/dL  Glucose, capillary     Status: Abnormal   Collection Time: 09/14/14 11:30 AM  Result Value Ref Range   Glucose-Capillary 206 (H) 70 - 99 mg/dL   Comment 1 Notify RN    Comment 2 Documented in Chart   Glucose, capillary     Status: Abnormal   Collection Time: 09/14/14  4:46 PM  Result Value Ref Range   Glucose-Capillary 168 (H) 70 - 99 mg/dL   Comment 1 Notify RN   Glucose, capillary     Status: Abnormal   Collection Time: 09/14/14  9:50 PM  Result Value Ref Range   Glucose-Capillary 165 (H)  70 - 99 mg/dL   Ct Head Wo Contrast  09/14/2014   CLINICAL DATA:  LEFT arm weakness has become worse. Symptoms began 09/12/14.  EXAM: CT HEAD WITHOUT CONTRAST  TECHNIQUE: Contiguous axial images were obtained from the base of the skull through the vertex without intravenous contrast.  COMPARISON:  CT head 09/12/2014.  MRI and MRA 09/13/2014.  FINDINGS: Increasing well-defined areas of cytotoxic edema throughout the RIGHT frontal, RIGHT temporal, and RIGHT insular regions also involving the periventricular white matter. There is a larger area of ischemia involving the RIGHT posterior frontal cortex compared with the exam of 09/12/2014 which may correlate with worsening symptoms. Increased cytotoxic edema involvement of the RIGHT insula as well. No frank lobar hemorrhage although difficult to exclude petechial transformation. No midline shift. Hyperdense RIGHT ICA terminus. Chronic LEFT occipital infarct is stable.  IMPRESSION: Compared with the original CT, there are larger areas of acute infarction observed which may correlate with increasing LEFT arm weakness. No frank lobar hemorrhage or midline shift.   Electronically Signed   By: Rolla Flatten M.D.   On: 09/14/2014 08:02    Assessment/Plan: Diagnosis: Right MCA infarct 1. Does the need for close, 24 hr/day medical supervision in concert with the patient's rehab needs make it unreasonable for this patient to be served in a less intensive setting? Yes 2. Co-Morbidities requiring supervision/potential complications: dm, htn, post-stroke sequelae 3. Due to bladder management, bowel management, safety, skin/wound care, disease management, medication administration, pain management and patient education, does the patient require 24 hr/day rehab nursing? Yes 4. Does the patient require coordinated care of a physician, rehab nurse, PT (1-2 hrs/day, 5 days/week), OT (1-2 hrs/day, 5 days/week) and SLP (1-2 hrs/day, 5 days/week) to address physical and functional  deficits in the context of the above medical diagnosis(es)? Yes Addressing deficits in the following areas: balance, endurance, locomotion, strength, transferring, bowel/bladder control, bathing, dressing, feeding, grooming, toileting, cognition, speech and psychosocial support 5. Can the patient actively participate in an intensive therapy program of at least 3 hrs of therapy per day at least 5 days per week? Yes 6. The potential for patient to make measurable gains while on inpatient rehab is excellent 7. Anticipated functional outcomes upon discharge from inpatient rehab are supervision  with PT, supervision with OT, modified independent and supervision with SLP. 8. Estimated rehab length of stay to reach the above functional goals is: 18-24 days 9. Does the patient have adequate social supports and living environment to accommodate these discharge functional goals? Yes 10. Anticipated D/C setting: Home 11. Anticipated post D/C treatments: HH therapy and Outpatient therapy 12. Overall Rehab/Functional Prognosis: excellent  RECOMMENDATIONS: This patient's condition is appropriate for continued rehabilitative care in the following setting: CIR Patient has agreed to participate in recommended program. Yes Note that insurance prior authorization may be required  for reimbursement for recommended care.  Comment: Rehab Admissions Coordinator to follow up. He is an excellent CIR candidate.  Thanks,  [name], MD, Mellody Drown     09/15/2014

## 2014-09-15 NOTE — Progress Notes (Signed)
Physical Therapy Treatment Patient Details Name: Brian Hall MRN: [id number] DOB: 01/24/1956 Today's Date: 09/15/2014    History of Present Illness Brian Hall is a 59yo man with pMH of HTN, DM, obesity who presented with weakness on the left and slurred speech that started prior to going to bed and was worse on awakening.  He was brought to the ED where he was found to have weakness on the left and a facial droop, left weakness worsened night of admission, 09/12/14, UE>LE.  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 Carotid Doppler with occlusion of the right ICA, bilateral vertebral occlusion    PT Comments    Utilized steady this session to work on standing balance/tolerance, weight shifting and serial sit to stands. Patient is highly motivated but becomes labile during session. Wife present throughout session and is very encouraging. Patient is hopeful that he will be able to transfer to CIR as early as tomorrow. Continue to recommend comprehensive inpatient rehab (CIR) for post-acute therapy needs.   Follow Up Recommendations  CIR     Equipment Recommendations  Other (comment) (TBD)    Recommendations for Other Services       Precautions / Restrictions Precautions Precautions: Fall Restrictions Weight Bearing Restrictions: No    Mobility  Bed Mobility Overal bed mobility: Needs Assistance Bed Mobility: Supine to Sit     Supine to sit: Min assist     General bed mobility comments: Min A for LLE out of bed and safety/positioning with L UE. Cues for positioning and technique. Patient utilized rail to lift trunk  Transfers Overall transfer level: Needs assistance   Transfers: Sit to/from Stand Sit to Stand: Mod assist;Min assist;+2 physical assistance         General transfer comment: Min A +2 with use of steady to work on mulitiple sit to stand. Patient required Mod A with lower surface. Cues for positioning and to position L side with transfers. Buckling of  LLE noted throughout and Mod A to correct  Ambulation/Gait                 Stairs            Wheelchair Mobility    Modified Rankin (Stroke Patients Only)       Balance Overall balance assessment: Needs assistance   Sitting balance-Leahy Scale: Fair Sitting balance - Comments: can maintain sitting without holding on with RUE but cannot accept challenges to left. Able to wt shift to right for scooting but not left   Standing balance support: Single extremity supported Standing balance-Leahy Scale: Poor                 High Level Balance Comments: Patient required A for weight shifting attempts within steady. Patient with L lean in standing needing A to facilitate R weight shift.     Cognition Arousal/Alertness: Awake/alert Behavior During Therapy: WFL for tasks assessed/performed (Labile) Overall Cognitive Status: Impaired/Different from baseline Area of Impairment: Safety/judgement;Awareness         Safety/Judgement: Decreased awareness of deficits;Decreased awareness of safety     General Comments: Continuous cues for positioning and awareness of L UE. Patient slightly impulsive at times. Labile throughout    Exercises      General Comments        Pertinent Vitals/Pain Pain Assessment: No/denies pain    Home Living     Available Help at Discharge: Family;Available 24 hours/day Type of Home: Mobile home  Prior Function            PT Goals (current goals can now be found in the care plan section) Progress towards PT goals: Progressing toward goals    Frequency  Min 4X/week    PT Plan Current plan remains appropriate    Co-evaluation             End of Session Equipment Utilized During Treatment: Gait belt Activity Tolerance: Patient tolerated treatment well Patient left: in chair;with call bell/phone within reach;with chair alarm set;with family/visitor present     Time: 1055-1120 PT Time Calculation  (min) (ACUTE ONLY): 25 min  Charges:  $Therapeutic Activity: 23-37 mins                    G Codes:      Jacqualyn Posey 09/15/2014, 11:38 AM 09/15/2014 Jacqualyn Posey PTA [phone] pager [phone] office

## 2014-09-15 NOTE — PMR Pre-admission (Signed)
PMR Admission Coordinator Pre-Admission Assessment  Patient: Brian Hall is an 59 y.o., male MRN: [id number] DOB: 09-Nov-1955 Height: 5\' 11"  (180.3 cm) Weight: 113.399 kg (250 lb)              Insurance Information HMO:     PPO: yes     PCP:      IPA:      80/20:      OTHER:  PRIMARY: ACS benefits services/[employer]      Policy#: [id number]      Subscriber: pt CM Name: Judie Petit      Phone#: [phone] ext [phone]     Fax#: [phone] Pre-Cert#: [id number] approved for 7 days with f/u needed 09/23/14      Employer:  Benefits:  Phone #: [phone]     Name: 09/15/14 Eff. Date: 09/02/13     Deduct: $1900      Out of Pocket Max: $7600      Life Max: none CIR: 80%      SNF: 80% 120 days Outpatient: 80%     Co-Pay: no visit limit Home Health: 80%      Co-Pay: no visit limit DME: 80%     Co-Pay: call [phone] for precert Providers: in network   Medicaid Application Date:       Case Manager:  Disability Application Date:       Case Worker:   Emergency [place] Information    Name Relation Home Work [place] [phone]       Current Medical History  Patient Admitting Diagnosis: right MCA infarct  History of Present Illness:Brian Hall is a 59 y.o. right handed male with history of hypertension, diastolic congestive heart failure, diabetes mellitus and peripheral neuropathy and throat cancer with neck dissection and radiation chemotherapy 2011. Patient lives with his wife and was independent prior to admission. Presented 09/12/2014 with left-sided weakness and slurred speech. MRI of the brain showed acute large territory right MCA infarct involving right frontal lobe as well as remote infarcts medial left occipital lobe and left cerebellar hemisphere. 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 Echocardiogram with ejection fraction of 35% no source of embolism. Carotid Dopplers with right ICA occlusion. Patient did not  receive TPA. Neurology consulted and maintained on aspirin as well as Plavix for CVA prophylaxis 3 months then Plavix alone. Subcutaneous heparin for DVT prophylaxis. Patient is tolerating a regular consistency diet.   Total: 13 NIH    Past Medical History  Past Medical History  Diagnosis Date  . Hypertension   . Diabetes mellitus   . Obesity   . CHF (congestive heart failure)   . SOB (shortness of breath)   . Hyperkalemia   . Renal insufficiency   . Cardiogenic shock   . Cardiac LV ejection fraction 10-20%   . Hyperlipemia   . Hypothyroidism   . Throat cancer 2011    s/p neck dissection, radiation, chemo    Family History  family history includes Hypertension in his father and mother.  Prior Rehab/Hospitalizations: none  Current Medications   Current facility-administered medications:  .  0.9 %  sodium chloride infusion, , Intravenous, Continuous, Ejiroghene E Emokpae, MD .  acetaminophen (TYLENOL) tablet 650 mg, 650 mg, Oral, Q6H PRN **OR** acetaminophen (TYLENOL) suppository 650 mg, 650 mg, Rectal, Q6H PRN, Ejiroghene E Emokpae, MD .  aspirin tablet 325 mg, 325 mg, Oral, Daily, Ejiroghene E Emokpae, MD, 325 mg at 09/16/14 1006 .  clopidogrel (PLAVIX) tablet 75 mg, 75 mg, Oral, Daily, Ejiroghene E Emokpae, MD, 75 mg at 09/16/14 1006 .  heparin injection 5,000 Units, 5,000 Units, Subcutaneous, 3 times per day, Bethena Roys, MD, 5,000 Units at 09/16/14 [phone] .  hydrALAZINE (APRESOLINE) injection 5 mg, 5 mg, Intravenous, Once, Ejiroghene E Emokpae, MD, 5 mg at 09/12/14 1754 .  insulin aspart (novoLOG) injection 0-15 Units, 0-15 Units, Subcutaneous, TID WC, Ejiroghene E Emokpae, MD, 5 Units at 09/16/14 1126 .  levothyroxine (SYNTHROID, LEVOTHROID) tablet 88 mcg, 88 mcg, Oral, QAC breakfast, Ejiroghene E Denton Brick, MD, 88 mcg at 09/16/14 0757 .  pantoprazole (PROTONIX) EC tablet 80 mg, 80 mg, Oral, Daily, Ejiroghene E Emokpae, MD, 80 mg at 09/16/14 1007 .  rosuvastatin  (CRESTOR) tablet 10 mg, 10 mg, Oral, q1800, Ejiroghene E Emokpae, MD, 10 mg at 09/15/14 1704  Patients Current Diet: Diet heart healthy/carb modified with thin liquids  Precautions / Restrictions Precautions Precautions: Fall Restrictions Weight Bearing Restrictions: No   Prior Activity Level Community (5-7x/wk): worked as Music therapist. Very active. rides [employer].   Home Assistive Devices / Equipment Home Assistive Devices/Equipment: None Home Equipment: None  Prior Functional Level Prior Function Level of Independence: Independent Comments: pt wants to be able to ride his motorcycle  Current Functional Level Cognition  Arousal/Alertness: Awake/alert Overall Cognitive Status: Impaired/Different from baseline Orientation Level: Oriented X4 Safety/Judgement: Decreased awareness of deficits, Decreased awareness of safety General Comments: Continuous cues for positioning and awareness of L UE. Patient slightly impulsive at times. Labile throughout Attention: Sustained Sustained Attention: Appears intact Memory: Impaired Memory Impairment: Retrieval deficit Awareness: Appears intact Problem Solving: Impaired (higher level infor) Problem Solving Impairment: Functional complex Behaviors: Lability Safety/Judgment:  (will assess further)    Extremity Assessment (includes Sensation/Coordination)          ADLs  Overall ADL's : Needs assistance/impaired Eating/Feeding: Minimal assistance Grooming: Minimal assistance Upper Body Bathing: Moderate assistance Lower Body Bathing: Maximal assistance, +2 for physical assistance Upper Body Dressing : Maximal assistance Lower Body Dressing: +2 for physical assistance, Maximal assistance Toilet Transfer: +2 for physical assistance, Maximal assistance Functional mobility during ADLs: +2 for physical assistance General ADL Comments: Patient requires up to max +2 assist for functional tasks. Patient with flaccid left side.  Patient became emotional during session and therapist provided psychosocial support. Patient wife and step-son present also providing support. Patient will benefit from CIR to increase patient's overall independence and overall quality of life.     Mobility  Overal bed mobility: Needs Assistance Bed Mobility: Supine to Sit Supine to sit: Min assist General bed mobility comments: Min A for LLE out of bed and safety/positioning with L UE. Cues for positioning and technique. Patient utilized rail to lift trunk    Transfers  Overall transfer level: Needs assistance Transfers: Sit to/from Stand Sit to Stand: Mod assist, Min assist, +2 physical assistance Squat pivot transfers: Mod assist, +2 safety/equipment General transfer comment: Min A +2 with use of steady to work on mulitiple sit to stand. Patient required Mod A with lower surface. Cues for positioning and to position L side with transfers. Buckling of LLE noted throughout and Mod A to correct    Ambulation / Gait / Stairs / Wheelchair Mobility  Ambulation/Gait General Gait Details: unable at this point    Posture / Balance Dynamic Sitting Balance Sitting balance - Comments: can maintain sitting without holding on with RUE but cannot accept challenges to left. Able to wt shift to right  for scooting but not left High Level Balance High Level Balance Comments: Patient required A for weight shifting attempts within steady. Patient with L lean in standing needing A to facilitate R weight shift.     Special needs/care consideration                            Bowel mgmt: continent Bladder mgmt: continent    Previous Home Environment Living Arrangements: Spouse/significant other  Lives With: Spouse Available Help at Discharge: Family, Available 24 hours/day Type of Home: Mobile home Home Layout: One level Home Access: Stairs to enter Entrance Stairs-Rails: Right Entrance Stairs-Number of Steps: 4 Bathroom Shower/Tub: Gaffer,  Charity fundraiser: Standard Bathroom Accessibility: Yes How Accessible: Accessible via walker [place]: No  Discharge Living Setting Plans for Discharge Living Setting: Patient's home, Lives with (comment), Other (Comment) (wife) Type of Home at Discharge: Mobile home Discharge Home Layout: One level Discharge Home Access: Stairs to enter Entrance Stairs-Rails: Right Entrance Stairs-Number of Steps: 4 Discharge Bathroom Shower/Tub: Walk-in shower Discharge Bathroom Toilet: Standard Discharge Bathroom Accessibility: Yes How Accessible: Accessible via walker Does the patient have any problems obtaining your medications?: No  Social/Family/Support Systems Patient Roles: Spouse, Parent, Other (Comment) (employee) Anticipated Caregiver: wife Anticipated Caregiver's Contact Information: see above Ability/Limitations of Caregiver: unemployed. 5'4 and pt is 5'10 Caregiver Availability: 24/7 Discharge Plan Discussed with Primary Caregiver: Yes Is Caregiver In Agreement with Plan?: Yes Does Caregiver/Family have Issues with Lodging/Transportation while Pt is in Rehab?: No    Goals/Additional Needs Patient/Family Goal for Rehab: supervision with PT, OT, and SLP Expected length of stay: ELOS 18-24 days Pt/Family Agrees to Admission and willing to participate: Yes Program Orientation Provided & Reviewed with Pt/Caregiver Including Roles  & Responsibilities: Yes   Decrease burden of Care through IP rehab admission: n/a  Possible need for SNF placement upon discharge:not anticipated  Patient Condition: This patient's condition remains as documented in the consult dated 09/14/14, in which the Rehabilitation Physician determined and documented that the patient's condition is appropriate for intensive rehabilitative care in an inpatient rehabilitation facility. Will admit to inpatient rehab today.  Preadmission Screen Completed By:  [name], 09/16/2014 12:08  PM ______________________________________________________________________   Discussed status with Dr. Naaman Plummer on 09/16/14 at  1207  and received telephone approval for admission today.  Admission Coordinator:  [name], time 1207 Date 09/16/14.

## 2014-09-15 NOTE — Progress Notes (Signed)
Subjective: No change today from yesterday. Waiting for insurance paper work to go through so he can be admitted to in patient rehab.  Objective: Vital signs in last 24 hours: Filed Vitals:   09/14/14 1407 09/14/14 1828 09/14/14 2151 09/15/14 1050  BP: 164/77 133/76 116/75 125/74  Pulse: 70 102 85 99  Temp: 98.2 F (36.8 C) 98.8 F (37.1 C) 98.6 F (37 C) 98.8 F (37.1 C)  TempSrc: Oral Oral Oral Oral  Resp: 20 19 16 18   Height:      Weight:      SpO2: 97% 98% 96% 93%   Weight change:   Intake/Output Summary (Last 24 hours) at 09/15/14 1245 Last data filed at 09/15/14 0900  Gross per 24 hour  Intake    120 ml  Output      0 ml  Net    120 ml   Vitals reviewed. General: resting in bed, in NAD HEENT: EOMI, no scleral icterus, no visual field defect noted Cardiac: RRR,  Pulm: clear to auscultation bilaterally Abd: soft, nontender, nondistended, BS present Ext: warm and well perfused, no pedal edema Neuro: alert and oriented X3, left facial droop, mild dysarthria, Still with dense UE Strength on left 0/5, on right 5/5.   LE strength 3/5 on left and 5/5 on right.  Lab Results: Basic Metabolic Panel:  Recent Labs Lab 09/12/14 1041 09/12/14 1058 09/15/14 0344  NA 137 139 134*  K 4.5 4.6 4.6  CL 109 107 94*  CO2 23  --  27  GLUCOSE 196* 198* 163*  BUN 28* 35* 46*  CREATININE 1.31 1.20 1.59*  CALCIUM 10.0  --  9.7  MG  --   --  2.7*   Liver Function Tests:  Recent Labs Lab 09/12/14 1041  AST 31  ALT 26  ALKPHOS 55  BILITOT 1.0  PROT 7.4  ALBUMIN 4.1   CBC:  Recent Labs Lab 09/12/14 1041 09/12/14 1058 09/13/14 0530  WBC 12.4*  --  11.6*  NEUTROABS 9.6*  --   --   HGB 15.5 16.7 14.9  HCT 44.4 49.0 43.9  MCV 90.4  --  94.2  PLT 167  --  155   CBG:  Recent Labs Lab 09/14/14 0648 09/14/14 1130 09/14/14 1646 09/14/14 2150 09/15/14 0831 09/15/14 1122  GLUCAP 175* 206* 168* 165* 253* 240*   Hemoglobin A1C:  Recent Labs Lab  09/12/14 1041  HGBA1C 7.7*   Fasting Lipid Panel:  Recent Labs Lab 09/12/14 1041  CHOL 174  HDL 32*  LDLCALC 88  TRIG 271*  CHOLHDL 5.4   Coagulation:  Recent Labs Lab 09/12/14 1041  LABPROT 12.8  INR 0.95   Urinalysis:  Recent Labs Lab 09/12/14 1300  COLORURINE YELLOW  LABSPEC 1.021  PHURINE 5.0  GLUCOSEU 100*  HGBUR NEGATIVE  BILIRUBINUR NEGATIVE  KETONESUR NEGATIVE  PROTEINUR 100*  UROBILINOGEN 0.2  NITRITE NEGATIVE  LEUKOCYTESUR NEGATIVE   Studies/Results: Ct Head Wo Contrast  09/14/2014   CLINICAL DATA:  LEFT arm weakness has become worse. Symptoms began 09/12/14.  EXAM: CT HEAD WITHOUT CONTRAST  TECHNIQUE: Contiguous axial images were obtained from the base of the skull through the vertex without intravenous contrast.  COMPARISON:  CT head 09/12/2014.  MRI and MRA 09/13/2014.  FINDINGS: Increasing well-defined areas of cytotoxic edema throughout the RIGHT frontal, RIGHT temporal, and RIGHT insular regions also involving the periventricular white matter. There is a larger area of ischemia involving the RIGHT posterior frontal cortex compared with the  exam of 09/12/2014 which may correlate with worsening symptoms. Increased cytotoxic edema involvement of the RIGHT insula as well. No frank lobar hemorrhage although difficult to exclude petechial transformation. No midline shift. Hyperdense RIGHT ICA terminus. Chronic LEFT occipital infarct is stable.  IMPRESSION: Compared with the original CT, there are larger areas of acute infarction observed which may correlate with increasing LEFT arm weakness. No frank lobar hemorrhage or midline shift.   Electronically Signed   By: Rolla Flatten M.D.   On: 09/14/2014 08:02   Medications: I have reviewed the patient's current medications. Scheduled Meds: . aspirin  325 mg Oral Daily  . clopidogrel  75 mg Oral Daily  . heparin  5,000 Units Subcutaneous 3 times per day  . hydrALAZINE  5 mg Intravenous Once  . insulin aspart  0-15  Units Subcutaneous TID WC  . levothyroxine  88 mcg Oral QAC breakfast  . lisinopril  40 mg Oral Daily  . pantoprazole  80 mg Oral Daily  . rosuvastatin  10 mg Oral q1800   Continuous Infusions:  PRN Meds:.acetaminophen **OR** acetaminophen Assessment/Plan: 59 yr old man with PMH of Dm2, HTN, HLP, presenting with large right MCA stroke with left UE weakness, facial droop, dysarthria.   Right MCA infarct: Stable.  -Continue ASA 325mg , started plavix- 75mg  daily -Neurology signed off. -PT/OT - Recs- CIR- discharge when bed is available  HTN:  - Restart home Bp meds- 40mg  lisinopril daily, will help with his cardiomyopathy.  DM2: Holding home metformin/Januvia - Increase to SSI- Mod  Hyperlipidemia: LDL of 88, will switch statin to high intensity- and d/c home pravastatin 40mg .  - Start 10mg  of rosuvastatin- and uptitrate as tolerated to at least 20mg  daily.  Systolic CHF: No volume overload on physical exam. 2D echo from 2012 with EF of 10-20%.  Repeat 2D echo with EF of 30-35% with diffuse hypokinesis.  -Will need Cardiology consult/follow up - Cont Lisinopril- 40mg  daily.  Dispo: Disposition is deferred at this time, awaiting improvement of current medical problems.  Anticipated discharge in approximately 1-2  day(s).   The patient does not have a current PCP  and does need an [facility] hospital follow-up appointment after discharge.  The patient does not have transportation limitations that hinder transportation to clinic appointments.  .Services Needed at time of discharge: Y = Yes, Blank = No PT:   OT:   RN:   Equipment:   Other:     LOS: 3 days   Bethena Roys, MD  [facility], PGY3 09/15/2014, 12:45 PM

## 2014-09-15 NOTE — Evaluation (Addendum)
Speech Language Pathology Evaluation Patient Details Name: Brian Hall MRN: [id number] DOB: 1956/01/22 Today's Date: 09/15/2014 Time: 3710-6269 SLP Time Calculation (min) (ACUTE ONLY): 23 min  Problem List:  Patient Active Problem List   Diagnosis Date Noted  . CVA (cerebral vascular accident)   . Right carotid artery occlusion   . Acute right MCA stroke   . HTN (hypertension) 09/12/2014  . CVA (cerebral infarction) 09/12/2014  . DM (diabetes mellitus) 09/12/2014   Past Medical History:  Past Medical History  Diagnosis Date  . Hypertension   . Diabetes mellitus   . Obesity   . CHF (congestive heart failure)   . SOB (shortness of breath)   . Hyperkalemia   . Renal insufficiency   . Cardiogenic shock   . Cardiac LV ejection fraction 10-20%   . Hyperlipemia   . Hypothyroidism   . Throat cancer 2011    s/p neck dissection, radiation, chemo   Past Surgical History:  Past Surgical History  Procedure Laterality Date  . Cardiac catheterization    . Neck dissection Right 2011    s/p resection of throat cancer with multiple nodes removed   HPI:  59 yo man with PMH of HTN, DM, obesity, SOB, throat cancer s/p neck dissection, radiation and chemo who presented with weakness on the left and slurred speech. CT of the head showed a right MCA stroke.    Assessment / Plan / Recommendation Clinical Impression  Pt demonstrates mild-moderate dysarthria due to decreased left side labial/lingual ROM and strength resulting in phonemic distortions. Cognition for basic and moderate complex information appears intact. Pt would benefit from intervention targeted at dysarthria, working memory and executive functions for daily activities to increase independence. Will further evaluate attention to left side of environment and while reading. Labile during assessment. Is an excellent inpatient rehab candidate.     SLP Assessment  Patient needs continued Speech Lanaguage Pathology Services     Follow Up Recommendations  Inpatient Rehab    Frequency and Duration min 2x/week  2 weeks   Pertinent Vitals/Pain Pain Assessment: No/denies pain   SLP Goals  Potential to Achieve Goals (ACUTE ONLY): Good  SLP Evaluation Prior Functioning  Cognitive/Linguistic Baseline: Within functional limits Type of Home: Mobile home  Lives With: Spouse Available Help at Discharge: Family;Available 24 hours/day Vocation: Full time employment (truck driver)   Cognition  Overall Cognitive Status: Impaired/Different from baseline Arousal/Alertness: Awake/alert Orientation Level: Oriented to person;Disoriented to time;Oriented to place;Oriented to situation Attention: Sustained Sustained Attention: Appears intact Memory: Impaired Memory Impairment: Retrieval deficit Awareness: Appears intact Problem Solving: Impaired (higher level infor) Problem Solving Impairment: Functional complex Behaviors: Lability Safety/Judgment:  (will assess further)    Comprehension  Auditory Comprehension Overall Auditory Comprehension: Appears within functional limits for tasks assessed Visual Recognition/Discrimination Discrimination: Not tested Reading Comprehension Reading Status:  (discussed, not specifically assessed yet)    Expression Expression Primary Mode of Expression: Verbal Verbal Expression Overall Verbal Expression: Appears within functional limits for tasks assessed Initiation: No impairment Level of Generative/Spontaneous Verbalization: Conversation Repetition: No impairment Naming: No impairment Pragmatics: No impairment Written Expression Dominant Hand: Right Written Expression:  (TBA)   Oral / Motor Oral Motor/Sensory Function Overall Oral Motor/Sensory Function: Impaired Labial ROM: Reduced left Labial Symmetry: Abnormal symmetry left Labial Strength: Reduced Labial Sensation: Reduced Lingual ROM: Reduced left Lingual Symmetry: Within Functional Limits Lingual Strength:  Reduced Facial ROM: Reduced left Velum: Within Functional Limits Mandible: Within Functional Limits Motor Speech Overall Motor Speech: Impaired Respiration:  (  assess further, ? deficits in conversation?) Phonation: Normal Resonance: Within functional limits Articulation: Within functional limitis Intelligibility: Intelligibility reduced Word: 75-100% accurate Phrase: 75-100% accurate Sentence: 75-100% accurate Conversation: 50-74% accurate Motor Planning: Witnin functional limits Effective Techniques: Over-articulate;Increased vocal intensity   GO     [name] 09/15/2014, 11:11 AM  [name] Caroli.Ed [employer] [phone]

## 2014-09-15 NOTE — Progress Notes (Signed)
Subjective:    No acute events overnight, no new issues, no new complaints.    Objective:    Vital Signs:   Temp:  [98.2 F (36.8 C)-98.8 F (37.1 C)] 98.8 F (37.1 C) (01/14 1050) Pulse Rate:  [70-102] 99 (01/14 1050) Resp:  [16-20] 18 (01/14 1050) BP: (116-164)/(74-77) 125/74 mmHg (01/14 1050) SpO2:  [93 %-98 %] 93 % (01/14 1050) Last BM Date: 09/13/14   Intake/Output:   Intake/Output Summary (Last 24 hours) at 09/15/14 1228 Last data filed at 09/15/14 0900  Gross per 24 hour  Intake    120 ml  Output      0 ml  Net    120 ml      Physical Exam: General: Obese gentleman, lying in bed, no acute distress, responding appropriately to questions HEENT: PERRLA, EOMI, no gross visual field defecits, oropharynx is moist, no erythema or lesion PULM: Lungs clear to auscultation bilaterally, normal work of breathing CV: Heart sounds distant, normal S1/S2, no murmurs, rubs, or gallops ABD: Soft, non-tender, non-distended EXT: No clubbing, cyanosis, or edema NEURO: CNII-IV, VI normal as above, CN V sensation grossly intact, facial droop present on the left, left lower CN VII weakness noted, CNVIII-XII grossly normal. UE strength is 0/5 on the left, 5/5 on the right, UE sensation intact bilaterally, LE strength is 4/5 on the left, 5/5 on the right, light touch sensation is markedly reduced on the left, normal on the right.   Labs:  Basic Metabolic Panel:  Recent Labs Lab 09/15/14 0344  NA 134*  K 4.6  CL 94*  CO2 27  GLUCOSE 163*  BUN 46*  CREATININE 1.59*  CALCIUM 9.7  MG 2.7*    Liver Function Tests:  Recent Labs Lab 09/12/14 1041  AST 31  ALT 26  ALKPHOS 55  BILITOT 1.0  PROT 7.4  ALBUMIN 4.1   No results for input(s): LIPASE, AMYLASE in the last 168 hours. No results for input(s): AMMONIA in the last 168 hours.  CBC:  Recent Labs Lab 09/12/14 1041  09/13/14 0530  WBC 12.4*  --  11.6*  NEUTROABS 9.6*  --   --   HGB 15.5  < > 14.9  HCT 44.4   < > 43.9  MCV 90.4  --  94.2  PLT 167  --  155  < > = values in this interval not displayed.  Cardiac Enzymes:  Recent Labs Lab 09/15/14 0344  TROPONINI <0.03    BNP: Invalid input(s): POCBNP  CBG:  Recent Labs Lab 09/15/14 1122  GLUCAP 240*    Microbiology: No results found for this or any previous visit.  Coagulation Studies: No results for input(s): LABPROT, INR in the last 72 hours.   Imaging: Ct Head Wo Contrast  09/14/2014   CLINICAL DATA:  LEFT arm weakness has become worse. Symptoms began 09/12/14.  EXAM: CT HEAD WITHOUT CONTRAST  TECHNIQUE: Contiguous axial images were obtained from the base of the skull through the vertex without intravenous contrast.  COMPARISON:  CT head 09/12/2014.  MRI and MRA 09/13/2014.  FINDINGS: Increasing well-defined areas of cytotoxic edema throughout the RIGHT frontal, RIGHT temporal, and RIGHT insular regions also involving the periventricular white matter. There is a larger area of ischemia involving the RIGHT posterior frontal cortex compared with the exam of 09/12/2014 which may correlate with worsening symptoms. Increased cytotoxic edema involvement of the RIGHT insula as well. No frank lobar hemorrhage although difficult to exclude petechial transformation. No midline shift. Hyperdense RIGHT  ICA terminus. Chronic LEFT occipital infarct is stable.  IMPRESSION: Compared with the original CT, there are larger areas of acute infarction observed which may correlate with increasing LEFT arm weakness. No frank lobar hemorrhage or midline shift.   Electronically Signed   By: Rolla Flatten M.D.   On: 09/14/2014 08:02       Medications:    Infusions:    Scheduled Medications: . aspirin  325 mg Oral Daily  . clopidogrel  75 mg Oral Daily  . heparin  5,000 Units Subcutaneous 3 times per day  . hydrALAZINE  5 mg Intravenous Once  . insulin aspart  0-15 Units Subcutaneous TID WC  . levothyroxine  88 mcg Oral QAC breakfast  . lisinopril   40 mg Oral Daily  . pantoprazole  80 mg Oral Daily  . rosuvastatin  10 mg Oral q1800    PRN Medications: acetaminophen **OR** acetaminophen   Assessment/ Plan:    Pt is a 59 y.o. yo male with a PMHx of DMII, HTN, HLD, and CHF who presented with slurred speech and left sided weakness found to have large right sided MCA stroke.   Right sided MCA stroke - Patient presented outside the window for TPA therapy. MRA shows complete occlusion of right ICA, possibly secondary to previous surgery and radiation. Repeat CT today with slightly increased stroke size, but no mass effect or midline shift. Exam stable. - on ASA 325 and Plavix - changed to high intensity statin with rosuvastatin  - CIR has evaluated and agree he is a good candidate for inpatient rehab   HTN: Patient has passed the 48 hour window for permissive hypertension and was mildly hypertensive overnight.  - restarted home lisinopril 40 mg, now normotensive  DMII: Holding home metformin/Januvia, currently on SSI - HgbA1c 7.7% - further glucose control per PCP  Lipids: transitioned to high intensity statin with rosuvastatin as above - lipid panel notable for Trig of 252  History of CHF: Repeat echo with EF of ~30%, diffuse hypokinesis, but no evidence of thrombus. Clinically no signs of CHF, recommend follow up as an outpatient.   Code status- FULL  VTE PPX- Heparin  Dispo: Disposition is deferred at this time, awaiting CIR placement. Anticipated discharge in approximately 1-2 day(s).   The patient does have a current PCP (No primary care provider on file.) and does need an [facility] hospital follow-up appointment after discharge.  Length of Stay: 3 day(s)   Signed: Crecencio Mc, Med Student  Pager: [phone] (7AM-5PM) 09/15/2014, 12:26 PM      Length of Stay: 3 day(s)   Signed: Crecencio Mc, Med Student  Pager: [phone] (7AM-5PM) 09/15/2014, 12:28 PM

## 2014-09-15 NOTE — Progress Notes (Signed)
I met with pt and his wife at bedside.Both are in agreement to admission to inpt rehab. I will begin insurance authorization with Christella Scheuermann and am hopeful for approval to admit tomorrow. [phone]

## 2014-09-16 ENCOUNTER — Inpatient Hospital Stay (HOSPITAL_COMMUNITY)
Admission: RE | Admit: 2014-09-16 | Discharge: 2014-10-14 | DRG: 057 | Disposition: A | Discharge status: Home Health | Payer: 59 | Source: Intra-hospital | Attending: Physical Medicine & Rehabilitation | Admitting: Physical Medicine & Rehabilitation

## 2014-09-16 DIAGNOSIS — I5032 Chronic diastolic (congestive) heart failure: Secondary | ICD-10-CM | POA: Diagnosis present

## 2014-09-16 DIAGNOSIS — G8190 Hemiplegia, unspecified affecting unspecified side: Secondary | ICD-10-CM | POA: Diagnosis not present

## 2014-09-16 DIAGNOSIS — E039 Hypothyroidism, unspecified: Secondary | ICD-10-CM | POA: Diagnosis present

## 2014-09-16 DIAGNOSIS — E1165 Type 2 diabetes mellitus with hyperglycemia: Secondary | ICD-10-CM | POA: Diagnosis present

## 2014-09-16 DIAGNOSIS — I69328 Other speech and language deficits following cerebral infarction: Secondary | ICD-10-CM

## 2014-09-16 DIAGNOSIS — G8194 Hemiplegia, unspecified affecting left nondominant side: Secondary | ICD-10-CM | POA: Diagnosis present

## 2014-09-16 DIAGNOSIS — Z85819 Personal history of malignant neoplasm of unspecified site of lip, oral cavity, and pharynx: Secondary | ICD-10-CM | POA: Diagnosis not present

## 2014-09-16 DIAGNOSIS — R4586 Emotional lability: Secondary | ICD-10-CM | POA: Diagnosis not present

## 2014-09-16 DIAGNOSIS — R4587 Impulsiveness: Secondary | ICD-10-CM | POA: Diagnosis present

## 2014-09-16 DIAGNOSIS — Z923 Personal history of irradiation: Secondary | ICD-10-CM | POA: Diagnosis not present

## 2014-09-16 DIAGNOSIS — E785 Hyperlipidemia, unspecified: Secondary | ICD-10-CM | POA: Diagnosis present

## 2014-09-16 DIAGNOSIS — N179 Acute kidney failure, unspecified: Secondary | ICD-10-CM

## 2014-09-16 DIAGNOSIS — K219 Gastro-esophageal reflux disease without esophagitis: Secondary | ICD-10-CM | POA: Diagnosis present

## 2014-09-16 DIAGNOSIS — I69354 Hemiplegia and hemiparesis following cerebral infarction affecting left non-dominant side: Principal | ICD-10-CM

## 2014-09-16 DIAGNOSIS — E1140 Type 2 diabetes mellitus with diabetic neuropathy, unspecified: Secondary | ICD-10-CM

## 2014-09-16 DIAGNOSIS — I10 Essential (primary) hypertension: Secondary | ICD-10-CM | POA: Diagnosis present

## 2014-09-16 DIAGNOSIS — E6609 Other obesity due to excess calories: Secondary | ICD-10-CM | POA: Diagnosis present

## 2014-09-16 DIAGNOSIS — K5900 Constipation, unspecified: Secondary | ICD-10-CM | POA: Diagnosis present

## 2014-09-16 DIAGNOSIS — I63511 Cerebral infarction due to unspecified occlusion or stenosis of right middle cerebral artery: Secondary | ICD-10-CM | POA: Diagnosis not present

## 2014-09-16 DIAGNOSIS — R414 Neurologic neglect syndrome: Secondary | ICD-10-CM | POA: Diagnosis present

## 2014-09-16 DIAGNOSIS — E1142 Type 2 diabetes mellitus with diabetic polyneuropathy: Secondary | ICD-10-CM | POA: Diagnosis present

## 2014-09-16 DIAGNOSIS — Z9221 Personal history of antineoplastic chemotherapy: Secondary | ICD-10-CM

## 2014-09-16 LAB — BASIC METABOLIC PANEL
Anion gap: 14 (ref 5–15)
Anion gap: 12 (ref 5–15)
BUN: 47 mg/dL — ABNORMAL HIGH (ref 6–23)
BUN: 50 mg/dL — ABNORMAL HIGH (ref 6–23)
CO2: 20 mmol/L (ref 19–32)
CO2: 18 mmol/L — AB (ref 19–32)
CREATININE: 1.84 mg/dL — AB (ref 0.50–1.35)
Calcium: 9.1 mg/dL (ref 8.4–10.5)
Calcium: 9 mg/dL (ref 8.4–10.5)
Chloride: 102 mEq/L (ref 96–112)
Chloride: 105 mEq/L (ref 96–112)
Creatinine, Ser: 1.85 mg/dL — ABNORMAL HIGH (ref 0.50–1.35)
GFR calc Af Amer: 46 mL/min — ABNORMAL LOW (ref >90)
GFR calc non Af Amer: 39 mL/min — ABNORMAL LOW (ref >90)
GFR calc non Af Amer: 39 mL/min — ABNORMAL LOW (ref >90)
GFR, EST AFRICAN AMERICAN: 45 mL/min — AB (ref >90)
GLUCOSE: 248 mg/dL — AB (ref 70–99)
Glucose, Bld: 268 mg/dL — ABNORMAL HIGH (ref 70–99)
POTASSIUM: 4.4 mmol/L (ref 3.5–5.1)
Potassium: 4.6 mmol/L (ref 3.5–5.1)
Sodium: 136 mmol/L (ref 135–145)
Sodium: 135 mmol/L (ref 135–145)

## 2014-09-16 LAB — CBC
HCT: 44.9 % (ref 39.0–52.0)
Hemoglobin: 15 g/dL (ref 13.0–17.0)
MCH: 31.5 pg (ref 26.0–34.0)
MCHC: 33.4 g/dL (ref 30.0–36.0)
MCV: 94.3 fL (ref 78.0–100.0)
PLATELETS: 146 10*3/uL — AB (ref 150–400)
RBC: 4.76 MIL/uL (ref 4.22–5.81)
RDW: 13.5 % (ref 11.5–15.5)
WBC: 8 10*3/uL (ref 4.0–10.5)

## 2014-09-16 LAB — GLUCOSE, CAPILLARY
GLUCOSE-CAPILLARY: 206 mg/dL — AB (ref 70–99)
GLUCOSE-CAPILLARY: 201 mg/dL — AB (ref 70–99)
Glucose-Capillary: 213 mg/dL — ABNORMAL HIGH (ref 70–99)
Glucose-Capillary: 182 mg/dL — ABNORMAL HIGH (ref 70–99)

## 2014-09-16 MED ORDER — ONDANSETRON HCL 4 MG PO TABS: 4.0000 mg | ORAL_TABLET | Freq: Four times a day (QID) | ORAL | Status: DC | PRN

## 2014-09-16 MED ORDER — PANTOPRAZOLE SODIUM 40 MG PO TBEC
80.0000 mg | DELAYED_RELEASE_TABLET | Freq: Every day | ORAL | Status: DC
  Administered 2014-09-17 – 2014-10-14 (×28): 80 mg via ORAL
  Filled 2014-09-16 (×30): qty 2

## 2014-09-16 MED ORDER — SORBITOL 70 % SOLN: 30.0000 mL | Freq: Every day | Status: DC | PRN

## 2014-09-16 MED ORDER — ASPIRIN 81 MG PO CHEW
81.0000 mg | CHEWABLE_TABLET | Freq: Every day | ORAL | Status: DC
  Administered 2014-09-16: 81 mg via ORAL
  Filled 2014-09-16: qty 1

## 2014-09-16 MED ORDER — HEPARIN SODIUM (PORCINE) 5000 UNIT/ML IJ SOLN
5000.0000 [IU] | Freq: Three times a day (TID) | INTRAMUSCULAR | Status: DC
  Administered 2014-09-16 – 2014-10-14 (×83): 5000 [IU] via SUBCUTANEOUS
  Filled 2014-09-16 (×87): qty 1

## 2014-09-16 MED ORDER — SODIUM CHLORIDE 0.9 % IV SOLN
INTRAVENOUS | Status: DC
  Administered 2014-09-16: 14:00:00 via INTRAVENOUS

## 2014-09-16 MED ORDER — ACETAMINOPHEN 650 MG RE SUPP: 650.0000 mg | Freq: Four times a day (QID) | RECTAL | Status: DC | PRN

## 2014-09-16 MED ORDER — ROSUVASTATIN CALCIUM 10 MG PO TABS
10.0000 mg | ORAL_TABLET | Freq: Every day | ORAL | Status: DC
  Administered 2014-09-16 – 2014-10-13 (×28): 10 mg via ORAL
  Filled 2014-09-16 (×29): qty 1

## 2014-09-16 MED ORDER — ACETAMINOPHEN 325 MG PO TABS
650.0000 mg | ORAL_TABLET | Freq: Four times a day (QID) | ORAL | Status: DC | PRN
  Administered 2014-09-17 – 2014-10-01 (×4): 650 mg via ORAL
  Filled 2014-09-16 (×6): qty 2

## 2014-09-16 MED ORDER — INSULIN ASPART 100 UNIT/ML ~~LOC~~ SOLN
0.0000 [IU] | Freq: Three times a day (TID) | SUBCUTANEOUS | Status: DC
  Administered 2014-09-16: 5 [IU] via SUBCUTANEOUS
  Administered 2014-09-17: 8 [IU] via SUBCUTANEOUS
  Administered 2014-09-17 (×2): 5 [IU] via SUBCUTANEOUS
  Administered 2014-09-18: 8 [IU] via SUBCUTANEOUS
  Administered 2014-09-18 – 2014-09-19 (×3): 5 [IU] via SUBCUTANEOUS
  Administered 2014-09-19: 8 [IU] via SUBCUTANEOUS
  Administered 2014-09-19: 5 [IU] via SUBCUTANEOUS
  Administered 2014-09-20: 8 [IU] via SUBCUTANEOUS
  Administered 2014-09-20 (×2): 5 [IU] via SUBCUTANEOUS
  Administered 2014-09-21: 3 [IU] via SUBCUTANEOUS
  Administered 2014-09-21: 11 [IU] via SUBCUTANEOUS
  Administered 2014-09-21: 8 [IU] via SUBCUTANEOUS
  Administered 2014-09-22 (×2): 5 [IU] via SUBCUTANEOUS
  Administered 2014-09-22: 8 [IU] via SUBCUTANEOUS
  Administered 2014-09-23 (×3): 5 [IU] via SUBCUTANEOUS
  Administered 2014-09-24: 3 [IU] via SUBCUTANEOUS
  Administered 2014-09-24: 5 [IU] via SUBCUTANEOUS
  Administered 2014-09-24 – 2014-09-25 (×4): 3 [IU] via SUBCUTANEOUS
  Administered 2014-09-26: 2 [IU] via SUBCUTANEOUS
  Administered 2014-09-26: 3 [IU] via SUBCUTANEOUS
  Administered 2014-09-26 – 2014-09-28 (×4): 2 [IU] via SUBCUTANEOUS
  Administered 2014-09-28: 3 [IU] via SUBCUTANEOUS
  Administered 2014-09-29 (×2): 2 [IU] via SUBCUTANEOUS
  Administered 2014-09-30: 3 [IU] via SUBCUTANEOUS
  Administered 2014-09-30: 2 [IU] via SUBCUTANEOUS
  Administered 2014-10-02: 3 [IU] via SUBCUTANEOUS
  Administered 2014-10-05: 2 [IU] via SUBCUTANEOUS
  Administered 2014-10-06: 3 [IU] via SUBCUTANEOUS
  Administered 2014-10-06 – 2014-10-13 (×6): 2 [IU] via SUBCUTANEOUS

## 2014-09-16 MED ORDER — CLOPIDOGREL BISULFATE 75 MG PO TABS
75.0000 mg | ORAL_TABLET | Freq: Every day | ORAL | Status: DC
  Administered 2014-09-17 – 2014-10-14 (×28): 75 mg via ORAL
  Filled 2014-09-16 (×29): qty 1

## 2014-09-16 MED ORDER — ONDANSETRON HCL 4 MG/2ML IJ SOLN: 4.0000 mg | Freq: Four times a day (QID) | INTRAMUSCULAR | Status: DC | PRN

## 2014-09-16 MED ORDER — HEPARIN SODIUM (PORCINE) 5000 UNIT/ML IJ SOLN: 5000.0000 [IU] | Freq: Three times a day (TID) | INTRAMUSCULAR | Status: DC

## 2014-09-16 MED ORDER — SODIUM CHLORIDE 0.9 % IV SOLN: INTRAVENOUS | Status: DC

## 2014-09-16 MED ORDER — LEVOTHYROXINE SODIUM 88 MCG PO TABS
88.0000 ug | ORAL_TABLET | Freq: Every day | ORAL | Status: DC
  Administered 2014-09-17 – 2014-10-14 (×28): 88 ug via ORAL
  Filled 2014-09-16 (×31): qty 1

## 2014-09-16 NOTE — Progress Notes (Signed)
[facility] tranferred [facility] pt. Is alert and oriented and verbalized understanding of transfer, pt. Transferred in bed, family at bedside, Donnella Bi RN

## 2014-09-16 NOTE — Progress Notes (Signed)
Subjective:    No acute events overnight, no new issues, no new complaints.    Objective:    Vital Signs:   Temp:  [98 F (36.7 C)-98.9 F (37.2 C)] 98 F (36.7 C) (01/15 0942) Pulse Rate:  [63-102] 85 (01/15 0942) Resp:  [18-20] 20 (01/15 0942) BP: (94-179)/(43-90) 94/43 mmHg (01/15 0942) SpO2:  [95 %-97 %] 97 % (01/15 0942) Last BM Date: 09/14/14   Intake/Output:   Intake/Output Summary (Last 24 hours) at 09/16/14 1232 Last data filed at 09/16/14 0649  Gross per 24 hour  Intake    360 ml  Output    600 ml  Net   -240 ml      Physical Exam: General: Obese gentleman, lying in bed, no acute distress, responding appropriately to questions HEENT: PERRLA, EOMI, no gross visual field defecits, oropharynx is moist, no erythema or lesion PULM: Lungs clear to auscultation bilaterally, normal work of breathing CV: Heart sounds distant, normal S1/S2, no murmurs, rubs, or gallops ABD: Soft, non-tender, non-distended EXT: No clubbing, cyanosis, or edema NEURO: CNII-IV, VI normal as above, CN V sensation grossly intact, facial droop present on the left, left lower CN VII weakness noted, CNVIII-XII grossly normal. UE strength is 0/5 on the left, 5/5 on the right, UE sensation intact bilaterally, LE strength is 4/5 on the left, 5/5 on the right, light touch sensation is markedly reduced on the left, normal on the right.   Labs:  Basic Metabolic Panel:  Recent Labs Lab 09/15/14 0344 09/16/14 0910  NA 134* 136  K 4.6 4.6  CL 94* 102  CO2 27 20  GLUCOSE 163* 268*  BUN 46* 47*  CREATININE 1.59* 1.85*  CALCIUM 9.7 9.1  MG 2.7*  --     Liver Function Tests:  Recent Labs Lab 09/12/14 1041  AST 31  ALT 26  ALKPHOS 55  BILITOT 1.0  PROT 7.4  ALBUMIN 4.1   No results for input(s): LIPASE, AMYLASE in the last 168 hours. No results for input(s): AMMONIA in the last 168 hours.  CBC:  Recent Labs Lab 09/12/14 1041  09/13/14 0530  WBC 12.4*  --  11.6*    NEUTROABS 9.6*  --   --   HGB 15.5  < > 14.9  HCT 44.4  < > 43.9  MCV 90.4  --  94.2  PLT 167  --  155  < > = values in this interval not displayed.  Cardiac Enzymes:  Recent Labs Lab 09/15/14 0344  TROPONINI <0.03    BNP: Invalid input(s): POCBNP  CBG:  Recent Labs Lab 09/16/14 1104  GLUCAP 201*    Microbiology: No results found for this or any previous visit.  Coagulation Studies: No results for input(s): LABPROT, INR in the last 72 hours.   Imaging: No results found.     Medications:    Infusions: . sodium chloride      Scheduled Medications: . aspirin  325 mg Oral Daily  . clopidogrel  75 mg Oral Daily  . heparin  5,000 Units Subcutaneous 3 times per day  . hydrALAZINE  5 mg Intravenous Once  . insulin aspart  0-15 Units Subcutaneous TID WC  . levothyroxine  88 mcg Oral QAC breakfast  . pantoprazole  80 mg Oral Daily  . rosuvastatin  10 mg Oral q1800    PRN Medications: acetaminophen **OR** acetaminophen   Assessment/ Plan:    Pt is a 59 y.o. yo male with a PMHx of DMII, HTN,  HLD, and CHF who presented with slurred speech and left sided weakness found to have large right sided MCA stroke.   Right sided MCA stroke - Patient presented outside the window for TPA therapy. MRA shows complete occlusion of right ICA, possibly secondary to previous surgery and radiation. Repeat CT today with slightly increased stroke size, but no mass effect or midline shift. Exam stable. - ASA 81 mg and Plavix 75 mg q day - changed to high intensity statin with rosuvastatin  - plan for discharge to CIR today  HTN: Patient has passed the 48 hour window for permissive hypertension, is now actually slightly hypotensive.  - holding home lisinopril in setting of AKI as below  ? A fib: Patient had an EKG on 09/15/14 concerning for a fib. However, patient has been on telemetry and has had no episodes of a fib noted. Per discussion with Dr. Leonie Man, usually a patient needs  sustained arrhythmias for several minutes to meet qualifications for anticoagulation for stroke prophylaxis.  - hold anticoagulation per discussion above, can reconsider if any further evidence of arrhythmia  AKI: Patient's creatinine increased to 1.85 this morning from 1.59 yesterday. BUN was also elevated at 47, and RVU:[id number] ratio of >20 indicates prerenal azotemia most likely in the setting of dehydration 2/2 poor PO intake. Also in the setting of restarting ACE-i, so will hold that medication today given that the patient is normotensive.  - will give IVF today prior to discharge.    DMII: Holding home metformin/Januvia, currently on SSI - HgbA1c 7.7% - further glucose control per PCP  Lipids: transitioned to high intensity statin with rosuvastatin as above - lipid panel notable for Trig of 252  History of CHF: Repeat echo with EF of ~30%, diffuse hypokinesis, but no evidence of thrombus. Clinically no signs of CHF. Not currently on therapy for CHF except ACE-i which is being held as above.  - patient should follow up with a cardiologist as an outpatient to receive proper management of his CHF.    Code status- FULL  VTE PPX- Heparin  Dispo: Disposition is deferred at this time, awaiting CIR placement. Anticipated discharge in approximately 1-2 day(s).   The patient does have a current PCP (No primary care provider on file.) and does need an [facility] hospital follow-up appointment after discharge.  Length of Stay: 3 day(s)   Signed: Crecencio Mc, Med Student  Pager: [phone] (7AM-5PM) 09/15/2014, 12:26 PM      Length of Stay: 4 day(s)   Signed: Crecencio Mc, Med Student  Pager: [phone] (7AM-5PM) 09/16/2014, 12:32 PM

## 2014-09-16 NOTE — Progress Notes (Signed)
Brian Burke, RN Rehab Admission Coordinator Signed Physical Medicine and Rehabilitation PMR Pre-admission 09/15/2014 7:10 PM  Related encounter: ED to Hosp-Admission (Current) from 09/12/2014 in [place]   PMR Admission Coordinator Pre-Admission Assessment  Patient: Brian Hall is an 59 y.o., male MRN: [id number] DOB: [birth year] Height: 5\' 11"  (180.3 cm) Weight: 113.399 kg (250 lb)  Insurance Information HMO: PPO: yes PCP: IPA: 80/20: OTHER:  PRIMARY: ACS benefits services/[employer] Policy#: [id number] Subscriber: pt CM Name: Judie Petit Phone#: [phone] ext [phone] Fax#: [phone] Pre-Cert#: [id number] approved for 7 days with f/u needed 09/23/14 Employer:  Benefits: Phone #: [phone] Name: 09/15/14 Eff. Date: 09/02/13 Deduct: $1900 Out of Pocket Max: $7600 Life Max: none CIR: 80% SNF: 80% 120 days Outpatient: 80% Co-Pay: no visit limit Home Health: 80% Co-Pay: no visit limit DME: 80% Co-Pay: call [phone] for precert Providers: in network   Medicaid Application Date: Case Manager:  Disability Application Date: Case Worker:   Emergency [place] Information    Name Relation Home Work [place] [phone]       Current Medical History  Patient Admitting Diagnosis: right MCA infarct  History of Present Illness:Brian Hall is a 59 y.o. right handed male with history of hypertension, diastolic congestive heart failure, diabetes mellitus and peripheral neuropathy and throat cancer with neck dissection and radiation chemotherapy 2011. Patient lives with his wife and was independent prior to admission.  Presented 09/12/2014 with left-sided weakness and slurred speech. MRI of the brain showed acute large territory right MCA infarct involving right frontal lobe as well as remote infarcts medial left occipital lobe and left cerebellar hemisphere. 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 Echocardiogram with ejection fraction of 35% no source of embolism. Carotid Dopplers with right ICA occlusion. Patient did not receive TPA. Neurology consulted and maintained on aspirin as well as Plavix for CVA prophylaxis 3 months then Plavix alone. Subcutaneous heparin for DVT prophylaxis. Patient is tolerating a regular consistency diet.  Total: 13 NIH    Past Medical History  Past Medical History  Diagnosis Date  . Hypertension   . Diabetes mellitus   . Obesity   . CHF (congestive heart failure)   . SOB (shortness of breath)   . Hyperkalemia   . Renal insufficiency   . Cardiogenic shock   . Cardiac LV ejection fraction 10-20%   . Hyperlipemia   . Hypothyroidism   . Throat cancer 2011    s/p neck dissection, radiation, chemo    Family History  family history includes Hypertension in his father and mother.  Prior Rehab/Hospitalizations: none  Current Medications   Current facility-administered medications:  . 0.9 % sodium chloride infusion, , Intravenous, Continuous, Ejiroghene E Emokpae, MD . acetaminophen (TYLENOL) tablet 650 mg, 650 mg, Oral, Q6H PRN **OR** acetaminophen (TYLENOL) suppository 650 mg, 650 mg, Rectal, Q6H PRN, Ejiroghene E Emokpae, MD . aspirin tablet 325 mg, 325 mg, Oral, Daily, Ejiroghene E Emokpae, MD, 325 mg at 09/16/14 1006 . clopidogrel (PLAVIX) tablet 75 mg, 75 mg, Oral, Daily, Ejiroghene E Emokpae, MD, 75 mg at 09/16/14 1006 . heparin injection 5,000 Units, 5,000 Units, Subcutaneous, 3 times per day, Bethena Roys, MD, 5,000 Units at 09/16/14 [phone] . hydrALAZINE (APRESOLINE) injection 5 mg, 5  mg, Intravenous, Once, Ejiroghene E Emokpae, MD, 5 mg at 09/12/14 1754 . insulin aspart (novoLOG) injection 0-15 Units, 0-15 Units, Subcutaneous, TID WC,  Bethena Roys, MD, 5 Units at 09/16/14 1126 . levothyroxine (SYNTHROID, LEVOTHROID) tablet 88 mcg, 88 mcg, Oral, QAC breakfast, Ejiroghene E Denton Brick, MD, 88 mcg at 09/16/14 0757 . pantoprazole (PROTONIX) EC tablet 80 mg, 80 mg, Oral, Daily, Ejiroghene E Emokpae, MD, 80 mg at 09/16/14 1007 . rosuvastatin (CRESTOR) tablet 10 mg, 10 mg, Oral, q1800, Ejiroghene E Emokpae, MD, 10 mg at 09/15/14 1704  Patients Current Diet: Diet heart healthy/carb modified with thin liquids  Precautions / Restrictions Precautions Precautions: Fall Restrictions Weight Bearing Restrictions: No   Prior Activity Level Community (5-7x/wk): worked as Music therapist. Very active. rides [employer].   Home Assistive Devices / Equipment Home Assistive Devices/Equipment: None Home Equipment: None  Prior Functional Level Prior Function Level of Independence: Independent Comments: pt wants to be able to ride his motorcycle  Current Functional Level Cognition  Arousal/Alertness: Awake/alert Overall Cognitive Status: Impaired/Different from baseline Orientation Level: Oriented X4 Safety/Judgement: Decreased awareness of deficits, Decreased awareness of safety General Comments: Continuous cues for positioning and awareness of L UE. Patient slightly impulsive at times. Labile throughout Attention: Sustained Sustained Attention: Appears intact Memory: Impaired Memory Impairment: Retrieval deficit Awareness: Appears intact Problem Solving: Impaired (higher level infor) Problem Solving Impairment: Functional complex Behaviors: Lability Safety/Judgment: (will assess further)   Extremity Assessment (includes Sensation/Coordination)          ADLs  Overall ADL's : Needs assistance/impaired Eating/Feeding: Minimal  assistance Grooming: Minimal assistance Upper Body Bathing: Moderate assistance Lower Body Bathing: Maximal assistance, +2 for physical assistance Upper Body Dressing : Maximal assistance Lower Body Dressing: +2 for physical assistance, Maximal assistance Toilet Transfer: +2 for physical assistance, Maximal assistance Functional mobility during ADLs: +2 for physical assistance General ADL Comments: Patient requires up to max +2 assist for functional tasks. Patient with flaccid left side. Patient became emotional during session and therapist provided psychosocial support. Patient wife and step-son present also providing support. Patient will benefit from CIR to increase patient's overall independence and overall quality of life.     Mobility  Overal bed mobility: Needs Assistance Bed Mobility: Supine to Sit Supine to sit: Min assist General bed mobility comments: Min A for LLE out of bed and safety/positioning with L UE. Cues for positioning and technique. Patient utilized rail to lift trunk    Transfers  Overall transfer level: Needs assistance Transfers: Sit to/from Stand Sit to Stand: Mod assist, Min assist, +2 physical assistance Squat pivot transfers: Mod assist, +2 safety/equipment General transfer comment: Min A +2 with use of steady to work on mulitiple sit to stand. Patient required Mod A with lower surface. Cues for positioning and to position L side with transfers. Buckling of LLE noted throughout and Mod A to correct    Ambulation / Gait / Stairs / Wheelchair Mobility  Ambulation/Gait General Gait Details: unable at this point    Posture / Balance Dynamic Sitting Balance Sitting balance - Comments: can maintain sitting without holding on with RUE but cannot accept challenges to left. Able to wt shift to right for scooting but not left High Level Balance High Level Balance Comments: Patient required A for weight shifting attempts within steady. Patient with L lean  in standing needing A to facilitate R weight shift.     Special needs/care consideration   Bowel mgmt: continent Bladder mgmt: continent    Previous Home Environment Living Arrangements: Spouse/significant other Lives With: Spouse Available Help at Discharge: Family, Available 24 hours/day Type of Home: Mobile home Home Layout: One  level Home Access: Stairs to enter Entrance Stairs-Rails: Right Entrance Stairs-Number of Steps: 4 Bathroom Shower/Tub: Gaffer, Charity fundraiser: Standard Bathroom Accessibility: Yes How Accessible: Accessible via walker Home Care Services: No  Discharge Living Setting Plans for Discharge Living Setting: Patient's home, Lives with (comment), Other (Comment) (wife) Type of Home at Discharge: Mobile home Discharge Home Layout: One level Discharge Home Access: Stairs to enter Entrance Stairs-Rails: Right Entrance Stairs-Number of Steps: 4 Discharge Bathroom Shower/Tub: Walk-in shower Discharge Bathroom Toilet: Standard Discharge Bathroom Accessibility: Yes How Accessible: Accessible via walker Does the patient have any problems obtaining your medications?: No  Social/Family/Support Systems Patient Roles: Spouse, Parent, Other (Comment) (employee) Anticipated Caregiver: wife Anticipated Caregiver's Contact Information: see above Ability/Limitations of Caregiver: unemployed. 5'4 and pt is 5'10 Caregiver Availability: 24/7 Discharge Plan Discussed with Primary Caregiver: Yes Is Caregiver In Agreement with Plan?: Yes Does Caregiver/Family have Issues with Lodging/Transportation while Pt is in Rehab?: No    Goals/Additional Needs Patient/Family Goal for Rehab: supervision with PT, OT, and SLP Expected length of stay: ELOS 18-24 days Pt/Family Agrees to Admission and willing to participate: Yes Program Orientation Provided & Reviewed with Pt/Caregiver Including Roles & Responsibilities: Yes   Decrease  burden of Care through IP rehab admission: n/a  Possible need for SNF placement upon discharge:not anticipated  Patient Condition: This patient's condition remains as documented in the consult dated 09/14/14, in which the Rehabilitation Physician determined and documented that the patient's condition is appropriate for intensive rehabilitative care in an inpatient rehabilitation facility. Will admit to inpatient rehab today.  Preadmission Screen Completed By: Brian Hall, 09/16/2014 12:08 PM ______________________________________________________________________  Discussed status with Dr. Naaman Plummer on 09/16/14 at 1207 and received telephone approval for admission today.  Admission Coordinator: Brian Hall, time 1207 Date 09/16/14.          Cosigned by: [name], MD at 09/16/2014 1:15 PM  Revision History     Date/Time User Provider Type Action   09/16/2014 1:15 PM [name], MD Physician Cosign   09/16/2014 12:08 PM Brian Burke, RN Rehab Admission Coordinator Sign

## 2014-09-16 NOTE — H&P (Signed)
Physical Medicine and Rehabilitation Admission H&P   Chief Complaint  Patient presents with  . Stroke Symptoms  : Chief complaint;Weakness   HPI: Brian Hall is a 59 y.o. right handed male with history of hypertension, diastolic congestive heart failure, diabetes mellitus and peripheral neuropathy and throat cancer with neck dissection and radiation chemotherapy 2011. Patient lives with his wife and was independent prior to admission. Presented 09/12/2014 with left-sided weakness and slurred speech. MRI of the brain showed acute large territory right MCA infarct involving right frontal lobe as well as remote infarcts medial left occipital lobe and left cerebellar hemisphere. 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 Echocardiogram with ejection fraction of 35% no source of embolism. Carotid Dopplers with right ICA occlusion. Patient did not receive TPA. Neurology consulted and maintained on aspirin as well as Plavix for CVA prophylaxis 3 months then Plavix alone. Subcutaneous heparin for DVT prophylaxis. Mild elevation in creatinine from baseline 1.31-1.85 and lisinopril was discontinued with plan follow-up labs. Patient is tolerating a regular consistency diet. Physical therapy evaluation completed 09/14/2014 with recommendations of physical medicine rehabilitation consult.Patient was admitted for a comprehensive rehab program  ROS Review of Systems  Respiratory: Positive for shortness of breath.  Gastrointestinal: Positive for constipation.   GERD  Musculoskeletal: Positive for myalgias.  All other systems reviewed and are negative   Past Medical History  Diagnosis Date  . Hypertension   . Diabetes mellitus   . Obesity   . CHF (congestive heart failure)   . SOB (shortness of breath)   . Hyperkalemia   . Renal insufficiency   . Cardiogenic shock   . Cardiac LV ejection fraction 10-20%   . Hyperlipemia     . Hypothyroidism   . Throat cancer 2011    s/p neck dissection, radiation, chemo   Past Surgical History  Procedure Laterality Date  . Cardiac catheterization    . Neck dissection Right 2011    s/p resection of throat cancer with multiple nodes removed   Family History  Problem Relation Age of Onset  . Hypertension Mother   . Hypertension Father    Social History:  reports that he has never smoked. He does not have any smokeless tobacco history on file. He reports that he does not drink alcohol or use illicit drugs. Allergies: No Known Allergies Medications Prior to Admission  Medication Sig Dispense Refill  . aspirin EC 81 MG tablet Take 81 mg by mouth daily.    . carvedilol (COREG) 12.5 MG tablet Take 12.5 mg by mouth 2 (two) times daily.  0  . cholecalciferol (VITAMIN D) 400 UNITS TABS tablet Take 400 Units by mouth daily.    Marland Kitchen JANUVIA 100 MG tablet Take 100 mg by mouth daily.  0  . levothyroxine (SYNTHROID, LEVOTHROID) 88 MCG tablet Take 0.88 mcg by mouth daily.    Marland Kitchen lisinopril (PRINIVIL,ZESTRIL) 40 MG tablet Take 40 mg by mouth daily.    . metFORMIN (GLUCOPHAGE-XR) 750 MG 24 hr tablet Take 750 mg by mouth 2 (two) times daily.    . Multiple Vitamins-Minerals (MULTIVITAMIN WITH MINERALS) tablet Take 1 tablet by mouth daily.    Marland Kitchen omeprazole (PRILOSEC) 40 MG capsule Take 40 mg by mouth daily.    . pravastatin (PRAVACHOL) 40 MG tablet Take 40 mg by mouth daily.      Home: Home Living Family/patient expects to be discharged to:: Inpatient rehab Living Arrangements: Spouse/significant other Available Help at Discharge: Family, Available 24  hours/day Type of Home: Mobile home Home Access: Stairs to enter Entrance Stairs-Number of Steps: 4 Entrance Stairs-Rails: Right Home Layout: One level Home Equipment: None Lives With: Spouse  Functional History: Prior Function Level of  Independence: Independent Comments: pt wants to be able to ride his motorcycle  Functional Status:  Mobility: Bed Mobility Overal bed mobility: Needs Assistance Bed Mobility: Supine to Sit Supine to sit: Min assist General bed mobility comments: Min A for LLE out of bed and safety/positioning with L UE. Cues for positioning and technique. Patient utilized rail to lift trunk Transfers Overall transfer level: Needs assistance Transfers: Sit to/from Stand Sit to Stand: Mod assist, Min assist, +2 physical assistance Squat pivot transfers: Mod assist, +2 safety/equipment General transfer comment: Min A +2 with use of steady to work on mulitiple sit to stand. Patient required Mod A with lower surface. Cues for positioning and to position L side with transfers. Buckling of LLE noted throughout and Mod A to correct Ambulation/Gait General Gait Details: unable at this point    ADL: ADL Overall ADL's : Needs assistance/impaired Eating/Feeding: Minimal assistance Grooming: Minimal assistance Upper Body Bathing: Moderate assistance Lower Body Bathing: Maximal assistance, +2 for physical assistance Upper Body Dressing : Maximal assistance Lower Body Dressing: +2 for physical assistance, Maximal assistance Toilet Transfer: +2 for physical assistance, Maximal assistance Functional mobility during ADLs: +2 for physical assistance General ADL Comments: Patient requires up to max +2 assist for functional tasks. Patient with flaccid left side. Patient became emotional during session and therapist provided psychosocial support. Patient wife and step-son present also providing support. Patient will benefit from CIR to increase patient's overall independence and overall quality of life.   Cognition: Cognition Overall Cognitive Status: Impaired/Different from baseline Arousal/Alertness: Awake/alert Orientation Level: Oriented X4 Attention: Sustained Sustained Attention: Appears intact Memory:  Impaired Memory Impairment: Retrieval deficit Awareness: Appears intact Problem Solving: Impaired (higher level infor) Problem Solving Impairment: Functional complex Behaviors: Lability Safety/Judgment: (will assess further) Cognition Arousal/Alertness: Awake/alert Behavior During Therapy: WFL for tasks assessed/performed (Labile) Overall Cognitive Status: Impaired/Different from baseline Area of Impairment: Safety/judgement, Awareness Safety/Judgement: Decreased awareness of deficits, Decreased awareness of safety General Comments: Continuous cues for positioning and awareness of L UE. Patient slightly impulsive at times. Labile throughout  Physical Exam: Blood pressure 125/74, pulse 99, temperature 98.8 F (37.1 C), temperature source Oral, resp. rate 18, height 5' 11"  (1.803 m), weight 113.399 kg (250 lb), SpO2 93 %. Physical Exam Constitutional: He appears well-developed and well-nourished.  HENT: oral mucosa slightly dry. Pink. Dentition fair Head: Normocephalic.  Right Ear: External ear normal.  Left Ear: External ear normal.  Eyes:  Pupils reactive to light  Neck: Normal range of motion. Neck supple. No tracheal deviation present. No thyromegaly present.  Cardiovascular: Normal rate and regular rhythm.no murmur  Respiratory: Effort normal and breath sounds normal. No respiratory distress. no wheezes GI: Soft. Bowel sounds are normal. He exhibits no distension.  Musculoskeletal: He exhibits no edema.  Neurological: He is alert.  Makes good eye contact with examiner. He does have a right gaze preference but will cross midline to the left with prompting. Follows commands. He is oriented to person place and date of birth. LUE: 1+/5 at pec, bicep, tricep, trace 0/5 left hand and wrist. Left HE and KE 1 to 1+, trace movement at foot. Sensation 1/2 Left face,arm, leg. Mild left central 7 and tongue deviation. Cognitively displays reasonable insight and awareness. DT'rs 3+  left side. Toes up. Skin: Skin is  warm and dry.  Psychiatric: He has a normal mood and affect. His behavior is normal     Lab Results Last 48 Hours    Results for orders placed or performed during the hospital encounter of 09/12/14 (from the past 48 hour(s))  Glucose, capillary Status: Abnormal   Collection Time: 09/13/14 4:31 PM  Result Value Ref Range   Glucose-Capillary 189 (H) 70 - 99 mg/dL   Comment 1 Notify RN   Glucose, capillary Status: Abnormal   Collection Time: 09/13/14 9:50 PM  Result Value Ref Range   Glucose-Capillary 151 (H) 70 - 99 mg/dL  Glucose, capillary Status: Abnormal   Collection Time: 09/14/14 6:48 AM  Result Value Ref Range   Glucose-Capillary 175 (H) 70 - 99 mg/dL  Glucose, capillary Status: Abnormal   Collection Time: 09/14/14 11:30 AM  Result Value Ref Range   Glucose-Capillary 206 (H) 70 - 99 mg/dL   Comment 1 Notify RN    Comment 2 Documented in Chart   Glucose, capillary Status: Abnormal   Collection Time: 09/14/14 4:46 PM  Result Value Ref Range   Glucose-Capillary 168 (H) 70 - 99 mg/dL   Comment 1 Notify RN   Glucose, capillary Status: Abnormal   Collection Time: 09/14/14 9:50 PM  Result Value Ref Range   Glucose-Capillary 165 (H) 70 - 99 mg/dL  Basic metabolic panel Status: Abnormal   Collection Time: 09/15/14 3:44 AM  Result Value Ref Range   Sodium 134 (L) 135 - 145 mmol/L    Comment: Please note change in reference range.   Potassium 4.6 3.5 - 5.1 mmol/L    Comment: Please note change in reference range.   Chloride 94 (L) 96 - 112 mEq/L   CO2 27 19 - 32 mmol/L   Glucose, Bld 163 (H) 70 - 99 mg/dL   BUN 46 (H) 6 - 23 mg/dL   Creatinine, Ser 1.59 (H) 0.50 - 1.35 mg/dL   Calcium 9.7 8.4 - 10.5 mg/dL   GFR calc non Af Amer 47 (L) >90 mL/min   GFR calc Af Amer 54 (L) >90  mL/min    Comment: (NOTE) The eGFR has been calculated using the CKD EPI equation. This calculation has not been validated in all clinical situations. eGFR's persistently <90 mL/min signify possible Chronic Kidney Disease.    Anion gap 13 5 - 15  Magnesium Status: Abnormal   Collection Time: 09/15/14 3:44 AM  Result Value Ref Range   Magnesium 2.7 (H) 1.5 - 2.5 mg/dL  Troponin I Status: None   Collection Time: 09/15/14 3:44 AM  Result Value Ref Range   Troponin I <0.03 <0.031 ng/mL    Comment:   NO INDICATION OF MYOCARDIAL INJURY. Please note change in reference range.   Glucose, capillary Status: Abnormal   Collection Time: 09/15/14 8:31 AM  Result Value Ref Range   Glucose-Capillary 253 (H) 70 - 99 mg/dL  Glucose, capillary Status: Abnormal   Collection Time: 09/15/14 11:22 AM  Result Value Ref Range   Glucose-Capillary 240 (H) 70 - 99 mg/dL   Comment 1 Documented in Chart    Comment 2 Notify RN       Imaging Results (Last 48 hours)    Ct Head Wo Contrast  09/14/2014 CLINICAL DATA: LEFT arm weakness has become worse. Symptoms began 09/12/14. EXAM: CT HEAD WITHOUT CONTRAST TECHNIQUE: Contiguous axial images were obtained from the base of the skull through the vertex without intravenous contrast. COMPARISON: CT head 09/12/2014. MRI and MRA 09/13/2014. FINDINGS:  Increasing well-defined areas of cytotoxic edema throughout the RIGHT frontal, RIGHT temporal, and RIGHT insular regions also involving the periventricular white matter. There is a larger area of ischemia involving the RIGHT posterior frontal cortex compared with the exam of 09/12/2014 which may correlate with worsening symptoms. Increased cytotoxic edema involvement of the RIGHT insula as well. No frank lobar hemorrhage although difficult to exclude petechial transformation. No midline shift. Hyperdense RIGHT ICA terminus. Chronic  LEFT occipital infarct is stable. IMPRESSION: Compared with the original CT, there are larger areas of acute infarction observed which may correlate with increasing LEFT arm weakness. No frank lobar hemorrhage or midline shift. Electronically Signed By: Rolla Flatten M.D. On: 09/14/2014 08:02        Medical Problem List and Plan:  1. Functional deficits secondary to Right MCA infarct due to large Vessel disaese 2. DVT Prophylaxis/Anticoagulation: SQ heparin .Monitor platelet counts and any signs of DVT 3. Pain Management: tylenol as needed 4. Diabetes Mellitus with peripheral neuropathy.HGB A1C 7.7.SSI. Check blood sugars before meals and at bedtime. Patient on Glucophage 750 mg twice daily and Januvia 100 mg daily prior to admission. Patient was tolerated 5. Neuropsych: This patient is capable of making decisions on his own behalf. 6. Skin/Wound Care: Routine skin checks 7. Fluids/Electrolytes/Nutrition:strict I&O.Follow up labs 8.Hypertension. Lisinopril discontinued 09/16/2014 secondary to mild elevation in creatinine to 1.85..(Patient had also been on Coreg 12.5 mg twice a day prior to admission and lisinopril 40 mg daily) Monitor with increased mobility and resume as needed 9. History throat cancer. Status post dissection with radiation chemotherapy 2011 10.Hypothyroidism.Synthroid 11.Diastolic CHF. Monitor for any signs of fluid overload 12.Hyperlipidemia. Crestor 13.GERD.Protonix   Post Admission Physician Evaluation: 1. Functional deficits secondary to right MCA infarct. 2. Patient is admitted to receive collaborative, interdisciplinary care between the physiatrist, rehab nursing staff, and therapy team. 3. Patient's level of medical complexity and substantial therapy needs in context of that medical necessity cannot be provided at a lesser intensity of care such as a SNF. 4. Patient has experienced substantial functional loss from his/her baseline which was  documented above under the "Functional History" and "Functional Status" headings. Judging by the patient's diagnosis, physical exam, and functional history, the patient has potential for functional progress which will result in measurable gains while on inpatient rehab. These gains will be of substantial and practical use upon discharge in facilitating mobility and self-care at the household level. 5. Physiatrist will provide 24 hour management of medical needs as well as oversight of the therapy plan/treatment and provide guidance as appropriate regarding the interaction of the two. 6. 24 hour rehab nursing will assist with bladder management, bowel management, safety, skin/wound care, disease management, medication administration, pain management and patient education and help integrate therapy concepts, techniques,education, etc. 7. PT will assess and treat for/with: Lower extremity strength, range of motion, stamina, balance, functional mobility, safety, adaptive techniques and equipment, NMR, visual perceptual awareness, stroke education, caregiver ed. Goals are: supervision to min assist. 8. OT will assess and treat for/with: ADL's, functional mobility, safety, upper extremity strength, adaptive techniques and equipment, NMR, visual-perceptual awareness, family ed, stroke ed, ego-support. Goals are: supervision to min assist. Therapy may proceed with showering this patient. 9. SLP will assess and treat for/with: swallowing, communication, cognition. Goals are: supervision to mod I. 10. Case Management and Social Worker will assess and treat for psychological issues and discharge planning. 11. Team conference will be held weekly to assess progress toward goals and to determine barriers to discharge.  12. Patient will receive at least 3 hours of therapy per day at least 5 days per week. 13. ELOS: 18-24 days  14. Prognosis: excellent     [name], MD, [place]  Physical Medicine & Rehabilitation 09/16/2014

## 2014-09-16 NOTE — Discharge Instructions (Signed)
°  You were admitted and found to have a stroke, most likely caused by the prior radiation to your neck. To help prevent the risk of having another stroke you were started on a medication called plavix.   You are being discharged from the hospital to inpatient rehab. You have an appointment with your neurologist in 1 month as outlined below.   Also, we would like for you to have an appointment with a cardiologist to manage your heart failure.  Please take all medications as prescribed, and call your PCP if you have any questions.

## 2014-09-16 NOTE — Progress Notes (Signed)
Subjective: Complaint of restless legs, when asleep and at night.  No pain. Otherwise no change in deficits.   Objective: Vital signs in last 24 hours: Filed Vitals:   09/15/14 2228 09/16/14 0300 09/16/14 0650 09/16/14 0942  BP: 179/90 109/55 121/68 94/43  Pulse: 63 68 69 85  Temp: 98.9 F (37.2 C) 98.2 F (36.8 C) 98.2 F (36.8 C) 98 F (36.7 C)  TempSrc: Oral Oral Oral Oral  Resp: 18 20 18 20   Height:      Weight:      SpO2: 97% 96% 97% 97%   Weight change:   Intake/Output Summary (Last 24 hours) at 09/16/14 1240 Last data filed at 09/16/14 0649  Gross per 24 hour  Intake    360 ml  Output    600 ml  Net   -240 ml   Vitals reviewed. General: resting in bed, in NAD, wife at bed HEENT: EOMI, no scleral icterus, no visual field defect noted Cardiac: RRR,  Pulm: clear to auscultation bilaterally Abd: soft, full, nontender, nondistended, BS present Ext: warm and well perfused, no pedal edema Neuro: alert and oriented X3, left facial droop, mild dysarthria, Still with dense UE Strength on left 0/5, on right 5/5.   LE strength 3/5 on left and 5/5 on right.  Lab Results: Basic Metabolic Panel:  Recent Labs Lab 09/15/14 0344 09/16/14 0910  NA 134* 136  K 4.6 4.6  CL 94* 102  CO2 27 20  GLUCOSE 163* 268*  BUN 46* 47*  CREATININE 1.59* 1.85*  CALCIUM 9.7 9.1  MG 2.7*  --    Liver Function Tests:  Recent Labs Lab 09/12/14 1041  AST 31  ALT 26  ALKPHOS 55  BILITOT 1.0  PROT 7.4  ALBUMIN 4.1   CBC:  Recent Labs Lab 09/12/14 1041 09/12/14 1058 09/13/14 0530  WBC 12.4*  --  11.6*  NEUTROABS 9.6*  --   --   HGB 15.5 16.7 14.9  HCT 44.4 49.0 43.9  MCV 90.4  --  94.2  PLT 167  --  155   CBG:  Recent Labs Lab 09/15/14 0831 09/15/14 1122 09/15/14 1623 09/15/14 2135 09/16/14 0656 09/16/14 1104  GLUCAP 253* 240* 200* 185* 206* 201*   Hemoglobin A1C:  Recent Labs Lab 09/12/14 1041  HGBA1C 7.7*   Fasting Lipid Panel:  Recent Labs Lab  09/12/14 1041  CHOL 174  HDL 32*  LDLCALC 88  TRIG 271*  CHOLHDL 5.4   Coagulation:  Recent Labs Lab 09/12/14 1041  LABPROT 12.8  INR 0.95   Urinalysis:  Recent Labs Lab 09/12/14 1300  COLORURINE YELLOW  LABSPEC 1.021  PHURINE 5.0  GLUCOSEU 100*  HGBUR NEGATIVE  BILIRUBINUR NEGATIVE  KETONESUR NEGATIVE  PROTEINUR 100*  UROBILINOGEN 0.2  NITRITE NEGATIVE  LEUKOCYTESUR NEGATIVE   Studies/Results: No results found. Medications: I have reviewed the patient's current medications. Scheduled Meds: . aspirin  81 mg Oral Daily  . clopidogrel  75 mg Oral Daily  . heparin  5,000 Units Subcutaneous 3 times per day  . hydrALAZINE  5 mg Intravenous Once  . insulin aspart  0-15 Units Subcutaneous TID WC  . levothyroxine  88 mcg Oral QAC breakfast  . pantoprazole  80 mg Oral Daily  . rosuvastatin  10 mg Oral q1800   Continuous Infusions: . sodium chloride     Assessment/Plan: 59 yr old man with PMH of Dm2, HTN, HLP, presenting with large right MCA stroke with left UE weakness, facial droop,  dysarthria.   Right MCA infarct: Stable.  -Continue ASA 81mg , plavix- 75mg  daily -Neurology signed off. -PT/OT - Recs- CIR- Pt has a bed, discharge to CIR  AKI- Cr- 1.85- 09/16/2013- up to 1.2- 09/12/14. - IVF- 164mls/hr of N/s - Stop lisinopril - repeat Bmet  HTN:  - Hold home Bp meds, as Bp on the low side, also with Aki.  DM2: Holding home metformin/Januvia - Increase to SSI- Mod, will be discharge to CIR on home oral hypoglycemic agents   Hyperlipidemia: LDL of 88, will switch statin to high intensity- and d/c home pravastatin 40mg .  - Start 10mg  of rosuvastatin- and uptitrate as tolerated to at least 20mg  daily.  Systolic CHF: No volume overload on physical exam. 2D echo from 2012 with EF of 10-20%.  Repeat 2D echo with EF of 30-35% with diffuse hypokinesis.  -Will need Cardiology consult/follow up, not on BB, but pulse on tele mostly in the 60s. Also AICD. - Cont  Lisinopril- 40mg  daily.  Dispo: Disposition is deferred at this time, awaiting improvement of current medical problems.  Anticipated discharge in approximately today.  The patient does not have a current PCP  and does need an [facility] hospital follow-up appointment after discharge.  The patient does not have transportation limitations that hinder transportation to clinic appointments.  .Services Needed at time of discharge: Y = Yes, Blank = No PT:   OT:   RN:   Equipment:   Other:     LOS: 4 days   Bethena Roys, MD  [facility], PGY3 09/16/2014, 12:40 PM

## 2014-09-16 NOTE — Progress Notes (Signed)
Pt transferred to Rehab from 4N. Alert and orientated x 4, mild memory deficit noted. Diagnostic specific information provided. Orientated to rehab expectations, therapy schedule, safety plan etc. Wife with pt at bedside.

## 2014-09-16 NOTE — Progress Notes (Signed)
I have insurance approval to admit pt to inpt rehab today and bed is available I have contacted pt and Attending service and both are in agreement to admission today. I will make the arrangements. [phone]

## 2014-09-16 NOTE — Progress Notes (Signed)
  Date: 09/16/2014  Patient name: Brian Hall  Medical record number: [id number]  Date of birth: 1956/07/20   This patient's plan of care was discussed with the house staff. Please see their note for complete details. I concur with their findings.  Please note that in Dr. Talmadge Coventry note it states to both stop and continue lisinopril, the correct course of action is to stop lisinopril at this time due to AKI. He will be transitioned to CIR today.   [name], MD 09/16/2014, 8:44 PM

## 2014-09-16 NOTE — Progress Notes (Signed)
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 right handed male with history of hypertension, diastolic congestive heart failure, diabetes mellitus and peripheral neuropathy and throat cancer with neck dissection and radiation chemotherapy 2011. Patient lives with his wife and was independent prior to admission. Presented 09/12/2014 with left-sided weakness and slurred speech. MRI of the brain showed acute large territory right MCA infarct involving right frontal lobe as well as remote infarcts medial left occipital lobe and left cerebellar hemisphere. 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 Echocardiogram with ejection fraction of 35% no source of embolism. Carotid Dopplers with right ICA occlusion. Patient did not receive TPA. Neurology consulted and maintained on aspirin as well as Plavix for CVA prophylaxis 3 months then Plavix alone. Subcutaneous heparin for DVT prophylaxis. Patient is tolerating a regular consistency diet. Physical therapy evaluation completed 09/14/2014 with recommendations of physical medicine rehabilitation consult.   Review of Systems  Respiratory: Positive for shortness of breath.  Gastrointestinal: Positive for constipation.   GERD  Musculoskeletal: Positive for myalgias.  All other systems reviewed and are negative.  Past Medical History  Diagnosis Date  . Hypertension   . Diabetes mellitus   . Obesity   . CHF (congestive heart failure)   . SOB (shortness of breath)   . Hyperkalemia   . Renal insufficiency   .  Cardiogenic shock   . Cardiac LV ejection fraction 10-20%   . Hyperlipemia   . Hypothyroidism   . Throat cancer 2011    s/p neck dissection, radiation, chemo   Past Surgical History  Procedure Laterality Date  . Cardiac catheterization    . Neck dissection Right 2011    s/p resection of throat cancer with multiple nodes removed   Family History  Problem Relation Age of Onset  . Hypertension Mother   . Hypertension Father    Social History:  reports that he has never smoked. He does not have any smokeless tobacco history on file. He reports that he does not drink alcohol or use illicit drugs. Allergies: No Known Allergies Medications Prior to Admission  Medication Sig Dispense Refill  . aspirin EC 81 MG tablet Take 81 mg by mouth daily.    . carvedilol (COREG) 12.5 MG tablet Take 12.5 mg by mouth 2 (two) times daily.  0  . cholecalciferol (VITAMIN D) 400 UNITS TABS tablet Take 400 Units by mouth daily.    Marland Kitchen JANUVIA 100 MG tablet Take 100 mg by mouth daily.  0  . levothyroxine (SYNTHROID, LEVOTHROID) 88 MCG tablet Take 0.88 mcg by mouth daily.    Marland Kitchen lisinopril (PRINIVIL,ZESTRIL) 40 MG tablet Take 40 mg by mouth daily.    . metFORMIN (GLUCOPHAGE-XR) 750 MG 24 hr tablet Take 750 mg by mouth 2 (two) times daily.    . Multiple Vitamins-Minerals (MULTIVITAMIN WITH MINERALS) tablet Take 1 tablet by mouth daily.    Marland Kitchen omeprazole (PRILOSEC) 40 MG capsule Take 40 mg by mouth daily.    . pravastatin (PRAVACHOL) 40 MG tablet Take 40 mg by mouth daily.      Home: Home Living Family/patient expects  to be discharged to:: Inpatient rehab Living Arrangements: Spouse/significant other Available Help at Discharge: Family, Available 24 hours/day Type of Home: Mobile home Home Access: Stairs to enter Entrance Stairs-Number of Steps: 4 Entrance Stairs-Rails: Right Home Layout: One level Home  Equipment: None  Functional History: Prior Function Level of Independence: Independent Comments: pt wants to be able to ride his motorcycle Functional Status:  Mobility: Bed Mobility Overal bed mobility: Needs Assistance Bed Mobility: Supine to Sit Supine to sit: Mod assist General bed mobility comments: pt attempted to get to EOB independently x3 mins but was unable, with vc's and min A, pt was able to roll to right bringing left arm with him and use RLE to assist LLE off bed. Mod A to elevate trunk to achieve upright position. Pt able to wt shift without LOB to get to EOB. Transfers Overall transfer level: Needs assistance Transfers: Squat Pivot Transfers Squat pivot transfers: Mod assist, +2 safety/equipment General transfer comment: pt performed squat pivot to right with mod A, +2 behind pt for safety. Pt educated in safety concerns and need for going to right with transfers at this time. Pt able to sufficiently use left side body to pull self into chair but mod A for posterior motion into chair Ambulation/Gait General Gait Details: unable at this point    ADL: ADL Overall ADL's : Needs assistance/impaired Eating/Feeding: Minimal assistance Grooming: Minimal assistance Upper Body Bathing: Moderate assistance Lower Body Bathing: Maximal assistance, +2 for physical assistance Upper Body Dressing : Maximal assistance Lower Body Dressing: +2 for physical assistance, Maximal assistance Toilet Transfer: +2 for physical assistance, Maximal assistance Functional mobility during ADLs: +2 for physical assistance General ADL Comments: Patient requires up to max +2 assist for functional tasks. Patient with flaccid left side. Patient became emotional during session and therapist provided psychosocial support. Patient wife and step-son present also providing support. Patient will benefit from CIR to increase patient's overall independence and overall quality of life.    Cognition: Cognition Overall Cognitive Status: Within Functional Limits for tasks assessed Orientation Level: Oriented X4 Cognition Arousal/Alertness: Awake/alert Behavior During Therapy: (tearful throughout eval) Overall Cognitive Status: Within Functional Limits for tasks assessed  Blood pressure 116/75, pulse 85, temperature 98.6 F (37 C), temperature source Oral, resp. rate 16, height 5\' 11"  (1.803 m), weight 113.399 kg (250 lb), SpO2 96 %. Physical Exam  Constitutional: He appears well-developed and well-nourished.  HENT:  Head: Normocephalic.  Right Ear: External ear normal.  Left Ear: External ear normal.  Eyes:  Pupils reactive to light  Neck: Normal range of motion. Neck supple. No tracheal deviation present. No thyromegaly present.  Cardiovascular: Normal rate and regular rhythm.  Respiratory: Effort normal and breath sounds normal. No respiratory distress.  GI: Soft. Bowel sounds are normal. He exhibits no distension.  Musculoskeletal: He exhibits no edema.  Neurological: He is alert.  Makes good eye contact with examiner. He does have a right gaze preference but will cross midline to the left with prompting. Follows commands. He is oriented to person place and date of birth. LUE: 1/5 at pec, bicep, tricep, trace to absent left hand and wrist. Left HE and KE 1 to 1+, trace movement at foot. Sensation 1/2 Left face,arm, leg. Mild left central 7 and tongue deviation. Cognitively displays reasonable insight and awareness.  Skin: Skin is warm and dry.  Psychiatric: He has a normal mood and affect. His behavior is normal.     Lab Results Last 24 Hours    Results  for orders placed or performed during the hospital encounter of 09/12/14 (from the past 24 hour(s))  Glucose, capillary Status: Abnormal   Collection Time: 09/14/14 6:48 AM  Result Value Ref Range   Glucose-Capillary 175 (H) 70 - 99 mg/dL  Glucose, capillary Status: Abnormal    Collection Time: 09/14/14 11:30 AM  Result Value Ref Range   Glucose-Capillary 206 (H) 70 - 99 mg/dL   Comment 1 Notify RN    Comment 2 Documented in Chart   Glucose, capillary Status: Abnormal   Collection Time: 09/14/14 4:46 PM  Result Value Ref Range   Glucose-Capillary 168 (H) 70 - 99 mg/dL   Comment 1 Notify RN   Glucose, capillary Status: Abnormal   Collection Time: 09/14/14 9:50 PM  Result Value Ref Range   Glucose-Capillary 165 (H) 70 - 99 mg/dL      Imaging Results (Last 48 hours)    Ct Head Wo Contrast  09/14/2014 CLINICAL DATA: LEFT arm weakness has become worse. Symptoms began 09/12/14. EXAM: CT HEAD WITHOUT CONTRAST TECHNIQUE: Contiguous axial images were obtained from the base of the skull through the vertex without intravenous contrast. COMPARISON: CT head 09/12/2014. MRI and MRA 09/13/2014. FINDINGS: Increasing well-defined areas of cytotoxic edema throughout the RIGHT frontal, RIGHT temporal, and RIGHT insular regions also involving the periventricular white matter. There is a larger area of ischemia involving the RIGHT posterior frontal cortex compared with the exam of 09/12/2014 which may correlate with worsening symptoms. Increased cytotoxic edema involvement of the RIGHT insula as well. No frank lobar hemorrhage although difficult to exclude petechial transformation. No midline shift. Hyperdense RIGHT ICA terminus. Chronic LEFT occipital infarct is stable. IMPRESSION: Compared with the original CT, there are larger areas of acute infarction observed which may correlate with increasing LEFT arm weakness. No frank lobar hemorrhage or midline shift. Electronically Signed By: Rolla Flatten M.D. On: 09/14/2014 08:02     Assessment/Plan: Diagnosis: Right MCA infarct 1. Does the need for close, 24 hr/day medical supervision in concert with the patient's rehab needs make it unreasonable for this patient to be served  in a less intensive setting? Yes 2. Co-Morbidities requiring supervision/potential complications: dm, htn, post-stroke sequelae 3. Due to bladder management, bowel management, safety, skin/wound care, disease management, medication administration, pain management and patient education, does the patient require 24 hr/day rehab nursing? Yes 4. Does the patient require coordinated care of a physician, rehab nurse, PT (1-2 hrs/day, 5 days/week), OT (1-2 hrs/day, 5 days/week) and SLP (1-2 hrs/day, 5 days/week) to address physical and functional deficits in the context of the above medical diagnosis(es)? Yes Addressing deficits in the following areas: balance, endurance, locomotion, strength, transferring, bowel/bladder control, bathing, dressing, feeding, grooming, toileting, cognition, speech and psychosocial support 5. Can the patient actively participate in an intensive therapy program of at least 3 hrs of therapy per day at least 5 days per week? Yes 6. The potential for patient to make measurable gains while on inpatient rehab is excellent 7. Anticipated functional outcomes upon discharge from inpatient rehab are supervision with PT, supervision with OT, modified independent and supervision with SLP. 8. Estimated rehab length of stay to reach the above functional goals is: 18-24 days 9. Does the patient have adequate social supports and living environment to accommodate these discharge functional goals? Yes 10. Anticipated D/C setting: Home 11. Anticipated post D/C treatments: HH therapy and Outpatient therapy 12. Overall Rehab/Functional Prognosis: excellent  RECOMMENDATIONS: This patient's condition is appropriate for continued rehabilitative care in the following setting:  CIR Patient has agreed to participate in recommended program. Yes Note that insurance prior authorization may be required for reimbursement for recommended care.  Comment: Rehab Admissions Coordinator to follow up. He is an  excellent CIR candidate.  Thanks,  [name], MD, [facility]     09/15/2014       Revision History     Date/Time User Provider Type Action   09/15/2014 10:50 AM [name], MD Physician Sign   09/15/2014 6:40 AM Cathlyn Parsons, PA-C Physician Assistant Pend   View Details Report       Routing History     Date/Time From To Method   09/15/2014 10:50 AM [name], MD [name], MD In Basket

## 2014-09-16 NOTE — Progress Notes (Signed)
Physical Therapy Treatment Patient Details Name: Brian Hall MRN: [id number] DOB: 1955-10-01 Today's Date: 09/16/2014    History of Present Illness Brian Hall is a 59yo man with pMH of HTN, DM, obesity who presented with weakness on the left and slurred speech that started prior to going to bed and was worse on awakening.  He was brought to the ED where he was found to have weakness on the left and a facial droop, left weakness worsened night of admission, 09/12/14, UE>LE.  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 Carotid Doppler with occlusion of the right ICA, bilateral vertebral occlusion    PT Comments    Patient continues to be highly motivated and wants to return home and get to where he can "ride my [place]". Patient fatiguing quickly this session.  Continue to recommend comprehensive inpatient rehab (CIR) for post-acute therapy needs.   Follow Up Recommendations  CIR     Equipment Recommendations       Recommendations for Other Services       Precautions / Restrictions Precautions Precautions: Fall    Mobility  Bed Mobility Overal bed mobility: Needs Assistance Bed Mobility: Supine to Sit     Supine to sit: Min assist     General bed mobility comments: Min A for LLE out of bed and safety/positioning with L UE. Cues for positioning and technique. Patient utilized rail to lift trunk  Transfers Overall transfer level: Needs assistance   Transfers: Sit to/from Stand Sit to Stand: Mod assist;Min assist;+2 physical assistance         General transfer comment: Min A +2 with use of steady to work on mulitiple sit to stand. Patient required Mod A with lower surface. Cues for positioning and to position L side with transfers. Buckling of LLE noted throughout and Mod A to correct. Attempted stand pivot transfer this session however, patient unable to carry/shift weight and it was unsafe without use of stedy.   Ambulation/Gait             General Gait  Details: unable at this point   Stairs            Wheelchair Mobility    Modified Rankin (Stroke Patients Only)       Balance     Sitting balance-Leahy Scale: Fair       Standing balance-Leahy Scale: Poor                 High Level Balance Comments: Continued to work on weight shifting in standing and control of L knee in standing.     Cognition Arousal/Alertness: Awake/alert Behavior During Therapy: WFL for tasks assessed/performed Overall Cognitive Status: Impaired/Different from baseline Area of Impairment: Safety/judgement;Awareness         Safety/Judgement: Decreased awareness of deficits;Decreased awareness of safety     General Comments: Continuous cues for positioning and awareness of L UE. Patient slightly impulsive at times. Labile throughout    Exercises      General Comments        Pertinent Vitals/Pain Pain Assessment: No/denies pain    Home Living                      Prior Function            PT Goals (current goals can now be found in the care plan section) Progress towards PT goals: Progressing toward goals    Frequency  Min 4X/week    PT  Plan Current plan remains appropriate    Co-evaluation             End of Session Equipment Utilized During Treatment: Gait belt Activity Tolerance: Patient tolerated treatment well Patient left: in chair;with call bell/phone within reach;with chair alarm set;with family/visitor present     Time: 1173-5670 PT Time Calculation (min) (ACUTE ONLY): 17 min  Charges:  $Therapeutic Activity: 8-22 mins                    G Codes:      Jacqualyn Posey 09/16/2014, 1:30 PM 09/16/2014 Jacqualyn Posey PTA [phone] pager [phone] office

## 2014-09-17 ENCOUNTER — Inpatient Hospital Stay (HOSPITAL_COMMUNITY): Payer: 59 | Admitting: Physical Therapy

## 2014-09-17 ENCOUNTER — Inpatient Hospital Stay (HOSPITAL_COMMUNITY): Payer: 59 | Admitting: Speech Pathology

## 2014-09-17 ENCOUNTER — Inpatient Hospital Stay (HOSPITAL_COMMUNITY): Payer: 59 | Admitting: Occupational Therapy

## 2014-09-17 LAB — GLUCOSE, CAPILLARY
GLUCOSE-CAPILLARY: 209 mg/dL — AB (ref 70–99)
GLUCOSE-CAPILLARY: 265 mg/dL — AB (ref 70–99)
GLUCOSE-CAPILLARY: 228 mg/dL — AB (ref 70–99)
Glucose-Capillary: 203 mg/dL — ABNORMAL HIGH (ref 70–99)
Glucose-Capillary: 192 mg/dL — ABNORMAL HIGH (ref 70–99)

## 2014-09-17 NOTE — Evaluation (Signed)
Physical Therapy Assessment and Plan  Patient Details  Name: Brian Hall MRN: [id number] Date of Birth: 1956-06-08  PT Diagnosis: Abnormality of gait and Hemiplegia non-dominant Rehab Potential: Good ELOS: 21 to 25 days   Today's Date: 09/17/2014 PT Individual Time: 1000-1100 PT Individual Time Calculation (min): 60 min    Problem List:  Patient Active Problem List   Diagnosis Date Noted  . Acute ischemic right MCA stroke 09/16/2014  . Left hemiparesis   . CVA (cerebral vascular accident)   . Right carotid artery occlusion   . Acute right MCA stroke   . Essential hypertension 09/12/2014  . CVA (cerebral infarction) 09/12/2014  . Type 2 diabetes mellitus with peripheral neuropathy 09/12/2014    Past Medical History:  Past Medical History  Diagnosis Date  . Hypertension   . Diabetes mellitus   . Obesity   . CHF (congestive heart failure)   . SOB (shortness of breath)   . Hyperkalemia   . Renal insufficiency   . Cardiogenic shock   . Cardiac LV ejection fraction 10-20%   . Hyperlipemia   . Hypothyroidism   . Throat cancer 2011    s/p neck dissection, radiation, chemo   Past Surgical History:  Past Surgical History  Procedure Laterality Date  . Cardiac catheterization    . Neck dissection Right 2011    s/p resection of throat cancer with multiple nodes removed    Assessment & Plan Clinical Impression: Patient is a 59 y.o. right handed male with history of hypertension, diastolic congestive heart failure, diabetes mellitus and peripheral neuropathy and throat cancer with neck dissection and radiation chemotherapy 2011. Patient lives with his wife and was independent prior to admission. Presented 09/12/2014 with left-sided weakness and slurred speech. MRI of the brain showed acute large territory right MCA infarct involving right frontal lobe as well as remote infarcts medial left occipital lobe and left cerebellar hemisphere. MRA of the head with occluded right  internal carotid artery. Echocardiogram with ejection fraction of 35% no source of embolism. Carotid Dopplers with right ICA occlusion. Patient did not receive TPA. Neurology consulted and maintained on aspirin as well as Plavix for CVA prophylaxis 3 months then Plavix alone. Subcutaneous heparin for DVT prophylaxis. Mild elevation in creatinine from baseline 1.31-1.85 and lisinopril was discontinued with plan follow-up labs. Patient is tolerating a regular consistency diet.    Patient transferred to CIR on 09/16/2014 .   Patient currently requires max with mobility secondary to muscle weakness, decreased cardiorespiratoy endurance, impaired timing and sequencing, unbalanced muscle activation and decreased coordination and decreased safety awareness and decreased memory.  Prior to hospitalization, patient was independent  with mobility and lived with Spouse, Other (Comment) (step son) in a Mobile home home.  Home access is 4 (3 stairs with R rail to porch, one step with posts into house)Stairs to enter.  Patient will benefit from skilled PT intervention to maximize safe functional mobility, minimize fall risk and decrease caregiver burden for planned discharge home with 24 hour assist.  Anticipate patient will benefit from follow up [facility] at discharge.  PT - End of Session Activity Tolerance: Tolerates 30+ min activity with multiple rests Endurance Deficit: Yes PT Assessment Rehab Potential (ACUTE/IP ONLY): Good Barriers to Discharge: Decreased caregiver support;Inaccessible home environment PT Patient demonstrates impairments in the following area(s): Balance;Endurance;Motor;Safety PT Transfers Functional Problem(s): Bed Mobility;Bed to Chair;Car PT Locomotion Functional Problem(s): Stairs;Wheelchair Mobility;Ambulation PT Plan PT Intensity: Minimum of 1-2 x/day ,45 to 90 minutes PT Frequency: 5  out of 7 days PT Duration Estimated Length of Stay: 21 to 25 days PT Treatment/Interventions:  Ambulation/gait training;Community reintegration;Discharge planning;Balance/vestibular training;Functional mobility training;Functional electrical stimulation;DME/adaptive equipment instruction;Neuromuscular re-education;Pain management;Splinting/orthotics;Patient/family education;Psychosocial support;Stair training;Therapeutic Activities;Therapeutic Exercise;UE/LE Coordination activities;UE/LE Strength taining/ROM;Wheelchair propulsion/positioning PT Transfers Anticipated Outcome(s): S transfers PT Locomotion Anticipated Outcome(s): min A gait, min A staris, mod I w/c  PT Recommendation Follow Up Recommendations: Home health PT Patient destination: Home Equipment Recommended: To be determined  Skilled Therapeutic Intervention PT evaluation completed and treatment initiated. Performed sliding board transfers initially with mod A, however following education pt able to complete sliding board transfers with min A and verbal cues to both the involved and uninvolved side. Attempted ambulation with hemi walker, pt able to ambulate about 2 feet with max to total A and constant verbal cues for sequencing and safety.   PT Evaluation Precautions/Restrictions Precautions Precautions: Fall Precaution Comments: left inattention, liable Restrictions Weight Bearing Restrictions: No General Chart Reviewed: Yes Family/Caregiver Present: No  Pain Pt c/o L sciatic nerve pain 5/10.    Home Living/Prior Functioning Home Living Available Help at Discharge: Family;Available 24 hours/day Type of Home: Mobile home Home Access: Stairs to enter [employer] of Steps: 4 (3 stairs with R rail to porch, one step with posts into house) Entrance Stairs-Rails: Right Home Layout: One level  Lives With: Spouse;Other (Comment) (step son) Prior Function Level of Independence: Independent with gait;Independent with transfers  Able to Take Stairs?: Yes Driving: Yes Vocation: Full time  employment Vision/Perception  Vision - Assessment Alignment/Gaze Preference: Gaze right  Inattention to L visual field and L side of body Cognition Overall Cognitive Status: Impaired/Different from baseline Arousal/Alertness: Awake/alert Orientation Level: Oriented X4 Memory: Impaired Memory Impairment: Decreased recall of new information Awareness: Impaired Problem Solving: Impaired Behaviors: Impulsive;Lability Safety/Judgment: Impaired Sensation Sensation Light Touch: Impaired Detail Light Touch Impaired Details: Impaired LLE Proprioception: Impaired Detail Proprioception Impaired Details: Impaired LLE Coordination Gross Motor Movements are Fluid and Coordinated: No Fine Motor Movements are Fluid and Coordinated: No Motor  Motor Motor: Hemiplegia;Abnormal postural alignment and control  Mobility Bed Mobility Bed Mobility: Sit to Supine;Supine to Sit Supine to Sit: 2: Max assist;HOB flat Sit to Supine: 2: Max assist;HOB flat Transfers Transfers: Yes Sit to Stand: 3: Mod assist Stand to Sit: 3: Mod assist Stand Pivot Transfers: 2: Max assist Lateral/Scoot Transfers: With slide board;4: Min assist Locomotion  Ambulation Ambulation: Yes Ambulation/Gait Assistance: 2: Max assist Ambulation Distance (Feet): 5 Feet Assistive device: Other (Comment) (R hand rail) Stairs / Additional Locomotion Stairs: Yes Stairs Assistance: Other (comment) (attempted however unable to complete due to fatigue and fear) Product manager Mobility: Yes Wheelchair Assistance: 5: Supervision Wheelchair Propulsion: Right upper extremity;Right lower extremity Distance: 100  Trunk/Postural Assessment  Postural Control Postural Control: Deficits on evaluation  Balance Balance Balance Assessed: Yes Static Sitting Balance Static Sitting - Balance Support: Feet supported Static Sitting - Level of Assistance: 5: Stand by assistance Static Standing Balance Static Standing -  Balance Support: Right upper extremity supported;During functional activity Static Standing - Level of Assistance: 3: Mod assist Dynamic Standing Balance Dynamic Standing - Balance Support: During functional activity Dynamic Standing - Level of Assistance: 2: Max assist Extremity Assessment  B UEs as per OT evaluation.  RLE Assessment RLE Assessment: Within Functional Limits LLE Assessment LLE Assessment: Exceptions to [facility] LLE PROM (degrees) Overall PROM Left Lower Extremity: Within functional limits for tasks assessed LLE Strength LLE Overall Strength: Deficits LLE Overall Strength Comments: hip ext and add 2-/5,  hip flex 1/5 otherwise 0/5  FIM:  FIM - Control and instrumentation engineer Devices:  (HOB flat) Bed/Chair Transfer: 2: Supine > Sit: Max A (lifting assist/Pt. 25-49%);2: Sit > Supine: Max A (lifting assist/Pt. 25-49%);2: Chair or W/C > Bed: Max A (lift and lower assist);2: Bed > Chair or W/C: Max A (lift and lower assist) FIM - Locomotion: Wheelchair Distance: 100 Locomotion: Wheelchair: 2: Travels 50 - 149 ft with supervision, cueing or coaxing FIM - Locomotion: Ambulation Locomotion: Ambulation Assistive Devices: Other (comment) (hand rail in hallway) Ambulation/Gait Assistance: 2: Max assist Locomotion: Ambulation: 1: Travels less than 50 ft with maximal assistance (Pt: 25 - 49%) FIM - Locomotion: Stairs Locomotion: Stairs: 0: Activity did not occur (activity attempted however unable to complete )   Refer to Care Plan for Long Term Goals  Recommendations for other services: None  Discharge Criteria: Patient will be discharged from PT if patient refuses treatment 3 consecutive times without medical reason, if treatment goals not met, if there is a change in medical status, if patient makes no progress towards goals or if patient is discharged from hospital.  The above assessment, treatment plan, treatment alternatives and goals were discussed and mutually  agreed upon: by patient  Dub Amis 09/17/2014, 3:35 PM

## 2014-09-17 NOTE — Progress Notes (Signed)
Patient ID: Brian Hall, male   DOB: 05/06/1956, 59 y.o.   MRN: [id number]  09/17/14.  59 y/o admit for CIR with functional deficits secondary to Right MCA infarct   HPI: Brian Hall is a 59 y.o. right handed male with history of hypertension, diastolic congestive heart failure, diabetes mellitus and peripheral neuropathy and throat cancer with neck dissection and radiation chemotherapy 2011.  Presented 09/12/2014 with left-sided weakness and slurred speech. MRI of the brain showed acute large territory right MCA infarct involving right frontal lobe as well as remote infarcts medial left occipital lobe and left cerebellar hemisphere. 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 Echocardiogram with ejection fraction of 35% no source of embolism. Carotid Dopplers with right ICA occlusion. Patient did not receive TPA. Neurology consulted and maintained on aspirin as well as Plavix for CVA prophylaxis 3 months then Plavix alone. Subcutaneous heparin for DVT prophylaxis.     ROS Review of Systems  Respiratory: Positive for shortness of breath.  Gastrointestinal: Positive for constipation.   GERD  Musculoskeletal: Positive for myalgias.  All other systems reviewed and are negative   Past Medical History  Diagnosis Date  . Hypertension   . Diabetes mellitus   . Obesity   . CHF (congestive heart failure)   . SOB (shortness of breath)   . Hyperkalemia   . Renal insufficiency   . Cardiogenic shock   . Cardiac LV ejection fraction 10-20%   . Hyperlipemia   . Hypothyroidism   . Throat cancer 2011    s/p neck dissection, radiation, chemo   Past Surgical History  Procedure Laterality Date  . Cardiac catheterization    . Neck dissection Right 2011    s/p resection of throat cancer with multiple nodes removed   Family History  Problem  Relation Age of Onset  . Hypertension Mother   . Hypertension Father    Social History: reports that he has never smoked. He does not have any smokeless tobacco history on file. He reports that he does not drink alcohol or use illicit drugs. Allergies: No Known Allergies Medications Prior to Admission  Medication Sig Dispense Refill  . aspirin EC 81 MG tablet Take 81 mg by mouth daily.    . carvedilol (COREG) 12.5 MG tablet Take 12.5 mg by mouth 2 (two) times daily.  0  . cholecalciferol (VITAMIN D) 400 UNITS TABS tablet Take 400 Units by mouth daily.    Marland Kitchen JANUVIA 100 MG tablet Take 100 mg by mouth daily.  0  . levothyroxine (SYNTHROID, LEVOTHROID) 88 MCG tablet Take 0.88 mcg by mouth daily.    Marland Kitchen lisinopril (PRINIVIL,ZESTRIL) 40 MG tablet Take 40 mg by mouth daily.    . metFORMIN (GLUCOPHAGE-XR) 750 MG 24 hr tablet Take 750 mg by mouth 2 (two) times daily.    . Multiple Vitamins-Minerals (MULTIVITAMIN WITH MINERALS) tablet Take 1 tablet by mouth daily.    Marland Kitchen omeprazole (PRILOSEC) 40 MG capsule Take 40 mg by mouth daily.    . pravastatin (PRAVACHOL) 40 MG tablet Take 40 mg by mouth daily.      Intake/Output Summary (Last 24 hours) at 09/17/14 0858 Last data filed at 09/17/14 0557  Gross per 24 hour  Intake    480 ml  Output   1150 ml  Net   -670 ml    Patient Vitals for the past 24 hrs:  BP Temp Temp src Pulse Resp SpO2 Weight  09/17/14 0529 138/71 mmHg 98.7 F (  37.1 C) Oral 72 18 95 % -  09/16/14 1527 118/66 mmHg 98.6 F (37 C) Oral 90 18 97 % 114.669 kg (252 lb 12.8 oz)    CBG (last 3)   Recent Labs  09/16/14 1647 09/16/14 2114 09/17/14 0640  GLUCAP 213* 182* 209*   Lab Results  Component Value Date   HGBA1C 7.7* 09/12/2014     Physical Exam: Blood pressure 125/74, pulse 99, temperature 98.8 F (37.1 C), temperature source Oral, resp.  rate 18, height 5\' 11"  (1.803 m), weight 113.399 kg (250 lb), SpO2 93 %. Physical Exam Constitutional: He appears well-developed and well-nourished.  HENT: oral mucosa slightly dry. Pink. Dentition fair Head: Normocephalic.  Right Ear: External ear normal.  Left Ear: External ear normal.  Eyes:  Pupils reactive to light  Neck: Normal range of motion. Neck supple. No tracheal deviation present. No thyromegaly present.  Cardiovascular: Normal rate and regular rhythm.no murmur  Respiratory: Effort normal and breath sounds normal. No respiratory distress. no wheezes GI: Soft. Bowel sounds are normal. He exhibits no distension.  Musculoskeletal: He exhibits no edema.  Neurological: He is alert.  Makes good eye contact with examiner. He does have a right gaze preference but will cross midline to the left with prompting. Follows commands. He is oriented to person place and date of birth.  Dense L HP DT'rs 3+ left side. Toes up. Skin: Skin is warm and dry.  Psychiatric: He has a normal mood and affect. His behavior is normal          Imaging Results (Last 48 hours)    Ct Head Wo Contrast  09/14/2014 CLINICAL DATA: LEFT arm weakness has become worse. Symptoms began 09/12/14. EXAM: CT HEAD WITHOUT CONTRAST TECHNIQUE: Contiguous axial images were obtained from the base of the skull through the vertex without intravenous contrast. COMPARISON: CT head 09/12/2014. MRI and MRA 09/13/2014. FINDINGS: Increasing well-defined areas of cytotoxic edema throughout the RIGHT frontal, RIGHT temporal, and RIGHT insular regions also involving the periventricular white matter. There is a larger area of ischemia involving the RIGHT posterior frontal cortex compared with the exam of 09/12/2014 which may correlate with worsening symptoms. Increased cytotoxic edema involvement of the RIGHT insula as well. No frank lobar hemorrhage although difficult to exclude petechial transformation. No midline  shift. Hyperdense RIGHT ICA terminus. Chronic LEFT occipital infarct is stable. IMPRESSION: Compared with the original CT, there are larger areas of acute infarction observed which may correlate with increasing LEFT arm weakness. No frank lobar hemorrhage or midline shift. Electronically Signed By: Rolla Flatten M.D. On: 09/14/2014 08:02        Medical Problem List and Plan:  1. Functional deficits secondary to Right MCA infarct  2. DVT Prophylaxis/Anticoagulation: SQ heparin .Monitor platelet counts and any signs of DVT 3. Pain Management: tylenol as needed 4. Diabetes Mellitus with peripheral neuropathy.HGB A1C 7.7.SSI. Check blood sugars before meals and at bedtime. Patient on Glucophage 750 mg twice daily and Januvia 100 mg daily prior to admission. Patient was tolerated  5. Hypertension. Lisinopril discontinued 09/16/2014 secondary to mild elevation in creatinine to 1.85..(Patient had also been on Coreg 12.5 mg twice a day prior to admission and lisinopril 40 mg daily) Monitor with increased mobility and resume as needed  6 .Hypothyroidism.Synthroid 7. Diastolic CHF. Monitor for any signs of fluid overload 8 .Hyperlipidemia. Crestor 9. GERD.Protonix

## 2014-09-17 NOTE — Evaluation (Addendum)
Speech Language Pathology Assessment and Plan  Patient Details  Name: Brian Hall MRN: [id number] Date of Birth: 12/31/1955  SLP Diagnosis: Cognitive Impairments;Dysarthria  Rehab Potential: Good ELOS: 21-28 days     Today's Date: 09/17/2014 SLP Individual Time: 1315-1420 SLP Individual Time Calculation (min): 65 min   Problem List:  Patient Active Problem List   Diagnosis Date Noted  . Acute ischemic right MCA stroke 09/16/2014  . Left hemiparesis   . CVA (cerebral vascular accident)   . Right carotid artery occlusion   . Acute right MCA stroke   . Essential hypertension 09/12/2014  . CVA (cerebral infarction) 09/12/2014  . Type 2 diabetes mellitus with peripheral neuropathy 09/12/2014   Past Medical History:  Past Medical History  Diagnosis Date  . Hypertension   . Diabetes mellitus   . Obesity   . CHF (congestive heart failure)   . SOB (shortness of breath)   . Hyperkalemia   . Renal insufficiency   . Cardiogenic shock   . Cardiac LV ejection fraction 10-20%   . Hyperlipemia   . Hypothyroidism   . Throat cancer 2011    s/p neck dissection, radiation, chemo   Past Surgical History:  Past Surgical History  Procedure Laterality Date  . Cardiac catheterization    . Neck dissection Right 2011    s/p resection of throat cancer with multiple nodes removed    Assessment / Plan / Recommendation Clinical Impression   Brian Hall is a 59 y.o. right handed male with history of hypertension, diastolic congestive heart failure, diabetes mellitus and peripheral neuropathy and throat cancer with neck dissection and radiation chemotherapy 2011. Presented 09/12/2014 with left-sided weakness and slurred speech. MRI of the brain showed acute large territory right MCA infarct involving right frontal lobe as well as remote infarcts medial left occipital lobe and left cerebellar hemisphere. Patient was admitted for a comprehensive rehab program on 09/16/2014.  SLP evaluation  completed on 09/17/2014 with the following results: Pt presents with moderately severe cognitive deficits characterized by impulsivity, decreased visual scanning to the left of the environment, decreased sustained attention, and decreased recall of daily information.  The abovementioned impairments impact all higher level cognitive tasks including functional problem solving, self monitoring and correcting errors, and emergent awareness of how his cognitive and physical deficits will impact his functional independence in his home environment.  No formal bedside swallowing evaluation completed on this date; however, per wife report, pt has needed set up assistance and intermittent verbal cues for safety when consuming his currently prescribed diet due to impulsivity and decreased awareness of objects on the left side of his tray.  No other difficulties tolerating a regular diet.  Therefore, recommend intermittent supervision and tray setup due to cognition.  Additionally, pt presents with a mild dysarthria characterized by decreased vocal intensity and imprecise articulation of consonants resulting from left sided oral motor weakness.   Pt would benefit from skilled ST services while inpatient in order to maximize functional independence and reduce burden of care prior to discharge.  Anticipate that pt will require 24/7 supervision at discharge, assistance for medication and financial management, and ST follow up at the next level of care (i.e. Home health, outpatient, SNF, etc.).       Skilled Therapeutic Interventions          Cognitive-linguistic evaluation completed with results and recommendations reviewed with patient and family.     SLP Assessment  Patient will need skilled Speech Lanaguage Pathology Services during   CIR admission    Recommendations  Patient destination:  (TBD) Follow up Recommendations: Home Health SLP;24 hour supervision/assistance;Outpatient SLP;Skilled Nursing facility Equipment  Recommended: None recommended by SLP    SLP Frequency 5 out of 7 days   SLP Treatment/Interventions Cognitive remediation/compensation;Cueing hierarchy;Patient/family education;Speech/Language facilitation;Oral motor exercises;Functional tasks;Internal/external aids;Environmental controls    Pain Pain Assessment Pain Assessment: No/denies pain Pain Score: 5  Pain Location: Knee Pain Orientation: Right Prior Functioning Cognitive/Linguistic Baseline: Within functional limits Type of Home: Mobile home  Lives With: Spouse Available Help at Discharge: Family;Available 24 hours/day Education: high school Vocation: Full time employment  Short Term Goals: Week 1: SLP Short Term Goal 1 (Week 1): Pt will improve visual scanning to the left during structured tasks over 75% of observable opportunities with mod assist  SLP Short Term Goal 2 (Week 1): Pt will improve basic, functional problem solving over 75% of observable opportunities with mod assist  SLP Short Term Goal 3 (Week 1): Pt will improve recall of daily information via use of external aids over 75% of observable opportunities with mod assist  SLP Short Term Goal 4 (Week 1): Pt will sustain attention to basic, functional tasks for 3-5 minutes with mod assist cues for redirection.  SLP Short Term Goal 5 (Week 1): Pt will improve speech intelligibility to >80% at the conversational level via min cues for use of dysarthria strategies.    See FIM for current functional status Refer to Care Plan for Long Term Goals  Recommendations for other services: None  Discharge Criteria: Patient will be discharged from SLP if patient refuses treatment 3 consecutive times without medical reason, if treatment goals not met, if there is a change in medical status, if patient makes no progress towards goals or if patient is discharged from hospital.  The above assessment, treatment plan, treatment alternatives and goals were discussed and mutually agreed  upon: by patient  Brian Hall 09/17/2014, 4:23 PM

## 2014-09-17 NOTE — Evaluation (Signed)
Occupational Therapy Assessment and Plan  Patient Details  Name: Brian Hall MRN: [id number] Date of Birth: Nov 10, 1955  OT Diagnosis: cognitive deficits and hemiplegia affecting non-dominant side Rehab Potential: Rehab Potential (ACUTE ONLY): Good ELOS: 21-25 days   Today's Date: 09/17/2014 OT Individual Time: 0800-0900 OT Individual Time Calculation (min): 60 min     Problem List:  Patient Active Problem List   Diagnosis Date Noted  . Acute ischemic right MCA stroke 09/16/2014  . Left hemiparesis   . CVA (cerebral vascular accident)   . Right carotid artery occlusion   . Acute right MCA stroke   . Essential hypertension 09/12/2014  . CVA (cerebral infarction) 09/12/2014  . Type 2 diabetes mellitus with peripheral neuropathy 09/12/2014    Past Medical History:  Past Medical History  Diagnosis Date  . Hypertension   . Diabetes mellitus   . Obesity   . CHF (congestive heart failure)   . SOB (shortness of breath)   . Hyperkalemia   . Renal insufficiency   . Cardiogenic shock   . Cardiac LV ejection fraction 10-20%   . Hyperlipemia   . Hypothyroidism   . Throat cancer 2011    s/p neck dissection, radiation, chemo   Past Surgical History:  Past Surgical History  Procedure Laterality Date  . Cardiac catheterization    . Neck dissection Right 2011    s/p resection of throat cancer with multiple nodes removed    Assessment & Plan Clinical Impression: Patient is a 59 y.o. year old male right handed male with history of hypertension, diastolic congestive heart failure, diabetes mellitus and peripheral neuropathy and throat cancer with neck dissection and radiation chemotherapy 2011. Patient lives with his wife and was independent prior to admission. Presented 09/12/2014 with left-sided weakness and slurred speech. MRI of the brain showed acute large territory right MCA infarct involving right frontal lobe as well as remote infarcts medial left occipital lobe and left  cerebellar hemisphere. 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 Echocardiogram with ejection fraction of 35% no source of embolism. Carotid Dopplers with right ICA occlusion. Patient did not receive TPA. Neurology consulted and maintained on aspirin as well as Plavix for CVA prophylaxis 3 months then Plavix alone. Subcutaneous heparin for DVT prophylaxis. Mild elevation in creatinine from baseline 1.31-1.85 and lisinopril was discontinued with plan follow-up labs. Patient is tolerating a regular consistency diet.  Patient transferred to CIR on 09/16/2014 .    Patient currently requires max A - total A for basic ADL tasks and max A for basic mobillity with basic self-care skills and basic mobility secondary to muscle weakness, decreased cardiorespiratoy endurance, unbalanced muscle activation and decreased coordination, decreased visual perceptual skills, decreased attention to left, decreased attention, decreased awareness, decreased problem solving, decreased safety awareness and decreased memory and decreased sitting balance, decreased standing balance, decreased postural control, hemiplegia, decreased balance strategies and difficulty maintaining precautions.  Prior to hospitalization, patient could complete ADL with independent .  Patient will benefit from skilled intervention to decrease level of assist with basic self-care skills and increase independence with basic self-care skills prior to discharge home with care partner.  Anticipate patient will require intermittent supervision and minimal physical assistance and follow up home health and follow up outpatient.  OT - End of Session Activity Tolerance: Tolerates 30+ min activity with multiple rests Endurance Deficit: Yes OT Assessment Rehab Potential (ACUTE ONLY): Good OT Patient demonstrates impairments in the following area(s): Balance;Sensory;Skin Integrity;Vision;Cognition;Endurance;Motor;Pain;Perception;Safety OT Basic  ADL's  Functional Problem(s): Eating;Grooming;Bathing;Dressing;Toileting OT Transfers Functional Problem(s): Toilet;Tub/Shower OT Additional Impairment(s): Fuctional Use of Upper Extremity OT Plan OT Intensity: Minimum of 1-2 x/day, 45 to 90 minutes OT Frequency: 5 out of 7 days OT Duration/Estimated Length of Stay: 21-25 days OT Treatment/Interventions: Balance/vestibular training;Cognitive remediation/compensation;Community reintegration;DME/adaptive equipment instruction;Disease mangement/prevention;Discharge planning;Functional electrical stimulation;Pain management;Self Care/advanced ADL retraining;Therapeutic Activities;UE/LE Coordination activities;Visual/perceptual remediation/compensation;Therapeutic Exercise;Patient/family education;Neuromuscular re-education;Psychosocial support;Splinting/orthotics;UE/LE Strength taining/ROM;Wheelchair propulsion/positioning OT Self Feeding Anticipated Outcome(s): mod I  OT Basic Self-Care Anticipated Outcome(s): supervision to min A  OT Toileting Anticipated Outcome(s): min  A OT Bathroom Transfers Anticipated Outcome(s): min A OT Recommendation Patient destination: Home Follow Up Recommendations: Home health OT;Outpatient OT Equipment Recommended: To be determined   Skilled Therapeutic Intervention 1:1 OT eval initated with OT goals, purpose and role discussed. Self care retraining at sink level with focus on bed mobility, squat pivot transfers, sit to stand, standing balance at sink with mirror for visual feedback, visual and body attention to the left, education on hemi dressing techniques, activity tolerance, standing endurance etc. Pt able to perform sit to stand with mod A and required min to mod A to maintain position for A for clothing management. Pt required max A for squat pivot due to impulsiveness and decr ability to maintain control during mobility and maintaining forward position. Pt was able to perform sit to stand using STEDY and  maintain standing position for seat to be put into place for transfers to toilet with different staff members while working on transfers to decr burden.Pt required max cuing to attend to left visual field throughout session.  Pt left in room with safety belt on.   OT Evaluation Precautions/Restrictions  Precautions Precautions: Fall Precaution Comments: left inattention; liable  Restrictions Weight Bearing Restrictions: No General Chart Reviewed: Yes Family/Caregiver Present: No    Pain Pain Assessment Pain Assessment: No/denies pain Home Living/Prior Functioning Home Living Available Help at Discharge: Family, Available 24 hours/day Type of Home: Mobile home Home Access: Stairs to enter Technical brewer of Steps: 4 Entrance Stairs-Rails: Right Home Layout: One level  Lives With: Spouse ADL ADL ADL Comments: see FIM Vision/Perception  Vision- History Baseline Vision/History: Wears glasses Wears Glasses: 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) Patient Visual Report: No change from baseline Vision- Assessment Vision Assessment?: Yes Eye Alignment: Within Functional Limits Ocular Range of Motion: Within Functional Limits Alignment/Gaze Preference: Gaze right Convergence: Within functional limits  Cognition Overall Cognitive Status: Impaired/Different from baseline Arousal/Alertness: Awake/alert Orientation Level: Oriented X4 Attention: Focused;Sustained;Selective Focused Attention: Appears intact Sustained Attention: Appears intact Selective Attention: Appears intact Memory: Impaired Memory Impairment: Decreased recall of new information Awareness: Impaired Awareness Impairment: Emergent impairment Behaviors: Impulsive;Lability Safety/Judgment: Impaired Sensation Sensation Light Touch: Impaired Detail Light Touch Impaired Details: Impaired LUE;Impaired LLE Stereognosis: Impaired Detail Stereognosis Impaired Details: Impaired LUE;Impaired  LLE Proprioception: Impaired Detail Proprioception Impaired Details: Impaired LUE;Impaired LLE Additional Comments: reports nubbness throughout UE/LE Coordination Gross Motor Movements are Fluid and Coordinated: No Fine Motor Movements are Fluid and Coordinated: No Finger Nose Finger Test: unable to perform Motor  Motor Motor: Hemiplegia;Abnormal postural alignment and control Mobility  Bed Mobility Bed Mobility: Rolling Right;Right Sidelying to Sit Rolling Right: 4: Min assist Right Sidelying to Sit: 3: Mod assist Transfers Transfers: Sit to Stand;Stand to Sit Sit to Stand: 3: Mod assist Stand to Sit: 3: Mod assist  Trunk/Postural Assessment  Cervical Assessment Cervical Assessment: Within Functional Limits (forward) Thoracic Assessment Thoracic Assessment:  (rounded thoracic region) Postural Control Postural Control: Deficits on evaluation Righting  Reactions: delayed Protective Responses: delayed  Balance Balance Balance Assessed: Yes Dynamic Sitting Balance Dynamic Sitting - Balance Support: Feet supported;During functional activity Dynamic Sitting - Level of Assistance: 4: Min assist Static Standing Balance Static Standing - Balance Support: During functional activity;Right upper extremity supported Static Standing - Level of Assistance: 3: Mod assist Static Standing - Comment/# of Minutes: stood at sink for Probation officer with mirror for visual feedback Extremity/Trunk Assessment RUE Assessment RUE Assessment: Within Functional Limits LUE Assessment LUE Assessment: Exceptions to WFL LUE PROM (degrees) LUE Overall PROM Comments: WFL LUE Strength LUE Overall Strength Comments: Brunstrom I-II - some movement at the shoulder- none seen distally LUE Tone LUE Tone: Modified Ashworth Modified Ashworth Scale for Grading Hypertonia LUE: No increase in muscle tone  FIM:  FIM - Eating Eating Activity: 5: Set-up assist for open containers FIM - Grooming Grooming  Steps: Wash, rinse, dry face;Wash, rinse, dry hands;Oral care, brush teeth, clean dentures;Brush, comb hair Grooming: 5: Set-up assist to open containers FIM - Bathing Bathing Steps Patient Completed: Chest;Abdomen;Front perineal area;Right upper leg;Left upper leg Bathing: 3: Mod-Patient completes 5-7 [id number] parts or 50-74% FIM - Upper Body Dressing/Undressing Upper body dressing/undressing steps patient completed: Put head through opening of pull over shirt/dress;Thread/unthread right sleeve of pullover shirt/dresss Upper body dressing/undressing: 3: Mod-Patient completed 50-74% of tasks FIM - Lower Body Dressing/Undressing Lower body dressing/undressing steps patient completed:  (wore jean shorts with fastener) Lower body dressing/undressing: 1: Total-Patient completed less than 25% of tasks FIM - Bed/Chair Transfer Bed/Chair Transfer: 3: Supine > Sit: Mod A (lifting assist/Pt. 50-74%/lift 2 legs;2: Bed > Chair or W/C: Max A (lift and lower assist)   Refer to Care Plan for Long Term Goals  Recommendations for other services: Neuropsych  Discharge Criteria: Patient will be discharged from OT if patient refuses treatment 3 consecutive times without medical reason, if treatment goals not met, if there is a change in medical status, if patient makes no progress towards goals or if patient is discharged from hospital.  The above assessment, treatment plan, treatment alternatives and goals were discussed and mutually agreed upon: by patient  SNicoletta Ba1/16/2016, 10:20 AM

## 2014-09-18 ENCOUNTER — Inpatient Hospital Stay (HOSPITAL_COMMUNITY): Payer: 59 | Admitting: *Deleted

## 2014-09-18 LAB — GLUCOSE, CAPILLARY
GLUCOSE-CAPILLARY: 263 mg/dL — AB (ref 70–99)
GLUCOSE-CAPILLARY: 221 mg/dL — AB (ref 70–99)
GLUCOSE-CAPILLARY: 243 mg/dL — AB (ref 70–99)
Glucose-Capillary: 214 mg/dL — ABNORMAL HIGH (ref 70–99)

## 2014-09-18 NOTE — Progress Notes (Signed)
Occupational Therapy Session Note  Patient Details  Name: Brian Hall MRN: [id number] Date of Birth: Nov 25, 1955  Today's Date: 09/18/2014 OT Individual Time:  - 0900-1000  (60 min)  Short Term Goals: Week 1:  OT Short Term Goal 1 (Week 1): Pt will transfer to BSC/toilet with mod A  OT Short Term Goal 2 (Week 1): Pt will don shirt with mod A with instructional cues OT Short Term Goal 3 (Week 1): Pt will perform sit to stand for clothing mangement with min  A OT Short Term Goal 4 (Week 1): Pt will attend to left visual field durning ADL (64min session) with mod cuing   Skilled Therapeutic Interventions/Progress Updates:    Pt. Lying in bed.  Pt. Agreed to bathe and dress at sink.    Attempted scoot pivot transfer from bed to wc going to left but pt too unsteady.  Transferred with STEDY >WC fpr bathing and dressing.  Provided instructional cues for Hemi strategies with bathing and dressing.  Needs reinforcement.  Ppt.leaning to left when standing with STEDY so called for plus 2 for safety.  Pt.emotilnal about stroke and stated his treatment for throat cancer caused the stroke.  Provided encouragement and hope.  Pt. Left in wc with safety belt on and all needs in reach.    Therapy Documentation Precautions:  Precautions Precautions: Fall Precaution Comments: left inattention, liable Restrictions Weight Bearing Restrictions: No  Pain: 3/10 sciatic pain on left hip.    ADL: ADL ADL Comments: see FIM       See FIM for current functional status  Therapy/Group: Individual Therapy  Lisa Roca 09/18/2014, 10:05 AM

## 2014-09-18 NOTE — Discharge Summary (Signed)
Patient Name: Brian Hall  MRN:  [id number]   DOB: 1955/10/31   PCP: No primary care provider on file.         Date of Admission: 09/16/2014  Date of Discharge: 09/18/2014        Attending Physician: Charlett Blake, MD      DISCHARGE DIAGNOSES: R MCA stroke  Other problems - CHF - DM - HTN - Left hemiparesis  DISPOSITION AND FOLLOW-UP: Brian Hall is to follow-up with the listed providers as detailed below, at which time, the following should be addressed:   1. Congestive heart failure (cardiologist)- pt needs to establish care with a cardiologist, optimize heart failure meds, and if ICD placement will be needed, acute kidney injury (PCP), Stroke (neurologist as below). Gradual resumption of Ace-inh at low dose and uptitrate and beta blocker as heart rate and Bp tolerates.  2. Labs / imaging needed at time of follow-up: BMET- Serum creatinine.   3. Pending labs/ test needing follow-up: None   DISCHARGE INSTRUCTIONS:  You will need to follow up with your doctor as soon as you are through with rehab. Also you should follow up with a heart doctor as your heart is not pumping effectively, and this needs to followed up closely.   DISCHARGE MEDICATIONS: Refresh list before signing   Medication List    ASK your doctor about these medications        aspirin EC 81 MG tablet  Take 81 mg by mouth daily.     carvedilol 12.5 MG tablet  Commonly known as:  COREG  Take 12.5 mg by mouth 2 (two) times daily.     cholecalciferol 400 UNITS Tabs tablet  Commonly known as:  VITAMIN D  Take 400 Units by mouth daily.     JANUVIA 100 MG tablet  Generic drug:  sitaGLIPtin  Take 100 mg by mouth daily.     levothyroxine 88 MCG tablet  Commonly known as:  SYNTHROID, LEVOTHROID  Take 0.88 mcg by mouth daily.     lisinopril 40 MG tablet  Commonly known as:  PRINIVIL,ZESTRIL  Take 40 mg by mouth daily.     metFORMIN 750 MG 24 hr tablet  Commonly known as:   GLUCOPHAGE-XR  Take 750 mg by mouth 2 (two) times daily.     multivitamin with minerals tablet  Take 1 tablet by mouth daily.     omeprazole 40 MG capsule  Commonly known as:  PRILOSEC  Take 40 mg by mouth daily.     pravastatin 40 MG tablet  Commonly known as:  PRAVACHOL  Take 40 mg by mouth daily.       CONSULTS:    Neurology  PROCEDURES PERFORMED:  Ct Head Wo Contrast  09/14/2014   CLINICAL DATA:  LEFT arm weakness has become worse. Symptoms began 09/12/14.  EXAM: CT HEAD WITHOUT CONTRAST  TECHNIQUE: Contiguous axial images were obtained from the base of the skull through the vertex without intravenous contrast.  COMPARISON:  CT head 09/12/2014.  MRI and MRA 09/13/2014.  FINDINGS: Increasing well-defined areas of cytotoxic edema throughout the RIGHT frontal, RIGHT temporal, and RIGHT insular regions also involving the periventricular white matter. There is a larger area of ischemia involving the RIGHT posterior frontal cortex compared with the exam of 09/12/2014 which may correlate with worsening symptoms. Increased cytotoxic edema involvement of the RIGHT insula as well. No frank lobar hemorrhage although difficult to exclude petechial transformation. No midline shift. Hyperdense RIGHT  ICA terminus. Chronic LEFT occipital infarct is stable.  IMPRESSION: Compared with the original CT, there are larger areas of acute infarction observed which may correlate with increasing LEFT arm weakness. No frank lobar hemorrhage or midline shift.   Electronically Signed   By: Rolla Flatten M.D.   On: 09/14/2014 08:02   Ct Head (brain) Wo Contrast  09/12/2014   CLINICAL DATA:  Left arm weakness, onset at 8 a.m. today. History of throat cancer.  EXAM: CT HEAD WITHOUT CONTRAST  TECHNIQUE: Contiguous axial images were obtained from the base of the skull through the vertex without intravenous contrast.  COMPARISON:  None.  FINDINGS: There is immediate low-attenuation in the right middle cerebral artery  territory, involving predominantly the right frontal and right parietal lobes. Remote infarcts are seen in the left occipital lobe and left cerebellar hemisphere. No evidence of acute hemorrhage, mass lesion, mass effect or hydrocephalus.  No air-fluid levels in the visualized portions of the paranasal sinuses or mastoid air cells.  IMPRESSION: 1. 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 2. Remote left occipital and left cerebellar infarcts.   Electronically Signed   By: Lorin Picket M.D.   On: 09/12/2014 12:10   Mr Jodene Nam Head Wo Contrast  09/13/2014   CLINICAL DATA:  Acute left-sided weakness. Evaluate for stroke. Initial evaluation.  EXAM: MRI HEAD WITHOUT CONTRAST  MRA HEAD WITHOUT CONTRAST  TECHNIQUE: Multiplanar, multiecho pulse sequences of the brain and surrounding structures were obtained without intravenous contrast. Angiographic images of the head were obtained using MRA technique without contrast.  COMPARISON:  Prior CT from 09/12/2014.  FINDINGS: MRI HEAD FINDINGS  Diffuse prominence of the CSF containing spaces is compatible with generalized cerebral atrophy. Minimal T2/FLAIR hyperintensity within the periventricular white matter noted, most consistent with chronic small vessel ischemic disease. Encephalomalacia within the medial left occipital lobe compatible with remote left PCA territory infarct. There are scattered remote left cerebellar infarcts.  Extensive restricted diffusion is seen involving the right frontal lobe, compatible with acute right MCA territory infarct. There is associated gyral swelling with edema in the area of infarct without significant mass effect. Probable minimal petechial hemorrhage seen within this region on gradient echo sequence without frank hemorrhagic conversion. No involvement of the right basal ganglia. No other acute intracranial infarct. Gray-white matter differentiation otherwise maintained.  No mass lesion or midline shift. No hydrocephalus. No  extra-axial fluid collection.  No acute abnormality seen about the orbits.  Pituitary gland within normal limits.  Mild cerebellar tonsillar ectopia L4-5 mm noted at the craniocervical junction. Visualized upper cervical spine within normal limits.  Visualized bone marrow signal intensity normal.  Paranasal sinuses are clear. Scattered fluid signal intensity noted within the mastoid air cells bilaterally, right greater than left.  MRA HEAD FINDINGS  ANTERIOR CIRCULATION:  Study is degraded by motion artifact.  The distal right internal carotid artery appears to be occluded. No flow seen within the petrous or cavernous segments of the right ICA is well. There is some distal reconstitution at the right ICA terminus via flow from the contralateral left ICA system across the circle of Willis. Markedly diminutive an attenuated flow seen within the right M1 segment and distal right MCA branches.  Visualized distal aspect of the cervical left ICA widely patent with antegrade flow. The petrous, cavernous, and supra clinoid segments patent without hemodynamically significant stenosis. Left A1 segment and right A1 segment well opacified. The left A2 segment is well opacified. The right A2 segment is markedly  diminutive in not well visualized. This may be due to occlusion, high-grade stenosis, or poor flow due to the occluded right ICA.  Left M1 segment well opacified. Atherosclerotic irregularity noted within the distal left MCA branches.  POSTERIOR CIRCULATION:  Dominant. There is a focal severe stenosis within the distal left vertebral artery prior to the loop vertebrobasilar junction. Posterior inferior cerebral artery is not visualized on this exam. Mild multi focal atherosclerotic irregularity present within the basilar artery which remains widely patent. Superior cerebellar arteries are patent proximally, but not well visualized distally.  Right P1 and P2 segments are patent without significant stenosis. The left P1 and  proximal P2 segments appear widely patent. The distal left P2 segment is not visualized, and may be occluded.  No aneurysm or vascular malformation.  IMPRESSION: MRI HEAD IMPRESSION:  1. Acute large territory right MCA territory infarct involving the right frontal lobe without significant mass effect. 2. Remote infarcts involving the medial left occipital lobe and left cerebellar hemisphere. 3. Generalized cerebral atrophy with mild chronic small vessel ischemic disease.  MRA HEAD IMPRESSION:  1. Occluded right internal carotid artery. There is some distal reconstitution at the right ICA terminus via flow across the circle Willis from the contralateral left ICA system. Flow within the right M1 segment and distal right MCA branches is markedly attenuated, and likely inadequate. 2. Poor visualization of the right A2 segment, likely due to occlusion of the right ICA with limited flow across the circle of Willis. 3. Nonvisualization of the mid -distal left PCA, likely occluded given the presence of remote left occipital infarct. 4. Focal short segment severe stenosis within the distal left vertebral artery. The right vertebral artery is dominant and widely patent.   Electronically Signed   By: Jeannine Boga M.D.   On: 09/13/2014 05:13   Mr Brain Wo Contrast  09/13/2014   CLINICAL DATA:  Acute left-sided weakness. Evaluate for stroke. Initial evaluation.  EXAM: MRI HEAD WITHOUT CONTRAST  MRA HEAD WITHOUT CONTRAST  TECHNIQUE: Multiplanar, multiecho pulse sequences of the brain and surrounding structures were obtained without intravenous contrast. Angiographic images of the head were obtained using MRA technique without contrast.  COMPARISON:  Prior CT from 09/12/2014.  FINDINGS: MRI HEAD FINDINGS  Diffuse prominence of the CSF containing spaces is compatible with generalized cerebral atrophy. Minimal T2/FLAIR hyperintensity within the periventricular white matter noted, most consistent with chronic small vessel  ischemic disease. Encephalomalacia within the medial left occipital lobe compatible with remote left PCA territory infarct. There are scattered remote left cerebellar infarcts.  Extensive restricted diffusion is seen involving the right frontal lobe, compatible with acute right MCA territory infarct. There is associated gyral swelling with edema in the area of infarct without significant mass effect. Probable minimal petechial hemorrhage seen within this region on gradient echo sequence without frank hemorrhagic conversion. No involvement of the right basal ganglia. No other acute intracranial infarct. Gray-white matter differentiation otherwise maintained.  No mass lesion or midline shift. No hydrocephalus. No extra-axial fluid collection.  No acute abnormality seen about the orbits.  Pituitary gland within normal limits.  Mild cerebellar tonsillar ectopia L4-5 mm noted at the craniocervical junction. Visualized upper cervical spine within normal limits.  Visualized bone marrow signal intensity normal.  Paranasal sinuses are clear. Scattered fluid signal intensity noted within the mastoid air cells bilaterally, right greater than left.  MRA HEAD FINDINGS  ANTERIOR CIRCULATION:  Study is degraded by motion artifact.  The distal right internal carotid artery appears to  be occluded. No flow seen within the petrous or cavernous segments of the right ICA is well. There is some distal reconstitution at the right ICA terminus via flow from the contralateral left ICA system across the circle of Willis. Markedly diminutive an attenuated flow seen within the right M1 segment and distal right MCA branches.  Visualized distal aspect of the cervical left ICA widely patent with antegrade flow. The petrous, cavernous, and supra clinoid segments patent without hemodynamically significant stenosis. Left A1 segment and right A1 segment well opacified. The left A2 segment is well opacified. The right A2 segment is markedly diminutive  in not well visualized. This may be due to occlusion, high-grade stenosis, or poor flow due to the occluded right ICA.  Left M1 segment well opacified. Atherosclerotic irregularity noted within the distal left MCA branches.  POSTERIOR CIRCULATION:  Dominant. There is a focal severe stenosis within the distal left vertebral artery prior to the loop vertebrobasilar junction. Posterior inferior cerebral artery is not visualized on this exam. Mild multi focal atherosclerotic irregularity present within the basilar artery which remains widely patent. Superior cerebellar arteries are patent proximally, but not well visualized distally.  Right P1 and P2 segments are patent without significant stenosis. The left P1 and proximal P2 segments appear widely patent. The distal left P2 segment is not visualized, and may be occluded.  No aneurysm or vascular malformation.  IMPRESSION: MRI HEAD IMPRESSION:  1. Acute large territory right MCA territory infarct involving the right frontal lobe without significant mass effect. 2. Remote infarcts involving the medial left occipital lobe and left cerebellar hemisphere. 3. Generalized cerebral atrophy with mild chronic small vessel ischemic disease.  MRA HEAD IMPRESSION:  1. Occluded right internal carotid artery. There is some distal reconstitution at the right ICA terminus via flow across the circle Willis from the contralateral left ICA system. Flow within the right M1 segment and distal right MCA branches is markedly attenuated, and likely inadequate. 2. Poor visualization of the right A2 segment, likely due to occlusion of the right ICA with limited flow across the circle of Willis. 3. Nonvisualization of the mid -distal left PCA, likely occluded given the presence of remote left occipital infarct. 4. Focal short segment severe stenosis within the distal left vertebral artery. The right vertebral artery is dominant and widely patent.   Electronically Signed   By: Jeannine Boga M.D.   On: 09/13/2014 05:13       ADMISSION DATA: H&P: 59 Y O M of HTN, DM, Obesity, presented today with c/o of weakness and slurred speech that started least night before he went to bed. Pt also endorses looking his balance and equilibrium when he wanted to go to the bathroom, so his wife had to help him. Wife also noticed that a part of his face was droopy, and reports he might have had some problem with urinary incontinence which is new. Pt notes that symptoms were the same as when he went to bed. He did not come to the Ed yesterday, for no real reason- wife says pt is sturborn and reported that he said will be fine.  Pt denies changes in his vision, but thinks he feels pain behind his eyes. Pt denies difficulty swallowing, he ate some crackers and had no coughing, choking or difficulty.  Pt denies ever having similar symptoms in the past. He takes an aspirin every day, does not miss doses, follow sup regularly with his doctor, and takes his BP and Diabetes medications  regularly. Pt denies seizures. He has had "throat cancer" in the past- received radiation, chemo and Surgery, and was told in 2014 that there was no sign of recurrence.  Initial Physical Exam findings: Blood pressure 147/98, pulse 65, temperature 97.8 F (36.6 C), temperature source Oral, resp. rate 21, height 5\' 11"  (1.803 m), weight 250 lb (113.399 kg), SpO2 96 %. GENERAL- alert, co-operative, appears as stated age, not in any distress. HEENT- Atraumatic, normocephalic, PERRL, EOMI, oral mucosa appears moist, No carotid bruit appreciated, neck supple. CARDIAC- Regular RR, no murmurs, rubs or gallops. RESP- Moving equal volumes of air, and clear to auscultation bilaterally, no wheezes or crackles. ABDOMEN- Soft, nontender, no palpable masses or organomegaly, bowel sounds present. BACK- Normal curvature of the spine, No tenderness along the vertebrae, no CVA tenderness. NEURO- left sided facial droop apparent,  uvular appears deviated to right, though tongue medline, though visibilty mildly limited by patient habitus, Strenght- Right side- 5/5- upper and lower, Strenght left side- strenght- 4/5, upper and lower extremities, sensation intact. No obvious Cr N abnormality, strenght upper and lower extremities- 5/5, Sensation intact- globally, DTRs- Normal, finger to nose test normal bilat, heel to chin normal. EXTREMITIES- pulse 2+, symmetric, no pedal edema. SKIN- Warm, dry, No rash or lesion. PSYCH- Normal mood and affect, appropriate thought content and speech.  Labs:    09/12/14 1041 09/12/14 1058  NA 137 139  K 4.5 4.6  CL 109 107  CO2 23 --   GLUCOSE 196* 198*  BUN 28* 35*  CREATININE 1.31 1.20  CALCIUM 10.0 --      Liver Function Tests:         09/12/14 1041  AST 31  ALT 26  ALKPHOS 55  BILITOT 1.0  PROT 7.4  ALBUMIN 4.1      CBC:        09/12/14 1041 09/12/14 1058  WBC 12.4* --   NEUTROABS 9.6* --   HGB 15.5 16.7  HCT 44.4 49.0  MCV 90.4 --   PLT 167 --      Lab Results  Component Value Date   CHOL 174 09/12/2014   HDL 32* 09/12/2014   LDLCALC 88 09/12/2014   TRIG 271* 09/12/2014   CHOLHDL 5.4 09/12/2014     HOSPITAL COURSE: The patient's hospital course by problem is below.   Right MCA stroke: The patient presented with left sided weakness- Upper strenght- 3/5-, lower strenght- 4+/5 and slurred speech. Head CT done on admission showed a large right MCA stroke. Unfortunately, the patient presented approximately 12 hours after the onset of symptoms, and was therefore not a candidate for thrombolytic therapy. An MRI done after admission confirmed the diagnosis of a right MCA stroke, and an MRA showed no flow through the right ICA. 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  On day 2 of admission, patients symptoms had progressed to a dense 0/5 left upper extremity weakness and a 3/5 lower extremity weakness. An echo was performed and showed no evidence of thrombus or possible cardiogenic cause for the stroke, and cardiac telemetry over a period of 48 hrs revealed no sustained arrhythmias, but frequent PVCs. An EKG done when pt had a a run of non sustained Ventricular tachycardia- caught a 10second run of atria fibrillation, but careful review of tele recording throughout admission showed sinus rhythm with PVCs. Recommendation on talking to neurology about findings- Duration  of atria fibrillation too short to recommend starting patient on anticoagulant. The patient was started on Plavix and continued on his home aspirin for antiplatelet therapy. Stroke risk factors were managed as outlined below.   HTN: The patient's home antihypertensives were initially held on admission to allow permissive hypertension in the immediate post-stroke time period. After 48 hours he was restarted on his home lisinopril dose, but his creatinine began to rise after reinitiation of this medication. Given the rising creatinine, and borderline hypotension on the last day of his admission, this medication was stopped, with instructions to recheck a creatinine after discharge and to discuss possibly restarting this medication with an outpatient cardiologist.   CHF: The patient has a history of CHF with LVEF of 10-20%. On repeat ECHO this admission the EF was 30-35%. However he had no clinical symptoms of CHF. His ACE-i was held for AKI as above. There was no evidence of fluid overload to warrant diuretics. A beta blocker and ACE-inh were held initially to allow for permissive hypertension in the setting of stroke, due to a pulse that was in the 60 at baseline and hypotension/low normal BP on admission. The patient will need to follow up on further management of his CHF with an outpatient cardiologist. As he  might be a candidate for an ICD. These medications can be restarted gradually as tolerated.  DMII: A HgbA1C checked on admit was 7.7, above goal of 7.0 He was placed on a sliding insulin scale and given a carb modified diet while inpatient, with instructions to follow up with his PCP for modification of his DMII medication regimen. Home regimen metformin 750mg  TID and januvia- 100mg  daily to be resumed on discharge. Pt might need addition of another oral hypoglycemic medication, for risk factor modification.   Hyperlipidemia: A lipid panel drawn on admission showed and LDL of 88, and a total cholesterol under 200, with triglycerides of 271. Given his admission for CVA, he was transition to high intensity statin therapy with rosuvastatin, starting at 10mg  daily, to up titrate on follow up.  AKI: Patient's creatinine increased 1.85 from 1.2 on admission. His BUN was also elevated at 47, and [id number] ratio of >20 indicated prerenal azotemia most likely in the setting of dehydration 2/2 poor PO intake, as well as restarting ACE-i therapy. He was given fluid resuscitation. Cr was rechecked prior to discharge, stablilized at 1.8, and then trended down 1.4 in rehab, pt will require follow up with his Creatinine.   DISCHARGE DATA: Vital Signs: BP 131/85 mmHg  Pulse 109  Temp(Src) 98.6 F (37 C) (Oral)  Resp 18  Wt 252 lb 12.8 oz (114.669 kg)  SpO2 97%  Labs: Results for orders placed or performed during the hospital encounter of 09/16/14 (from the past 24 hour(s))  Glucose, capillary     Status: Abnormal   Collection Time: 09/17/14  8:39 PM  Result Value Ref Range   Glucose-Capillary 192 (H) 70 - 99 mg/dL  Glucose, capillary     Status: Abnormal   Collection Time: 09/18/14  6:55 AM  Result Value Ref Range   Glucose-Capillary 214 (H) 70 - 99 mg/dL  Glucose, capillary     Status: Abnormal   Collection Time: 09/18/14 11:48 AM  Result Value Ref Range   Glucose-Capillary 263 (H) 70 - 99 mg/dL   Glucose, capillary     Status: Abnormal   Collection Time: 09/18/14  4:23 PM  Result Value Ref Range   Glucose-Capillary 221 (H) 70 - 99  mg/dL    Time spent on discharge: 35 minutes  Services Ordered on Discharge: Y = Yes; Blank = No PT:  yes- In patient rehab.  OT:   RN:   Equipment:   Other:

## 2014-09-18 NOTE — Progress Notes (Signed)
Patient ID: Brian Hall, male   DOB: 06-02-1956, 59 y.o.   MRN: [id number]  Patient ID: Brian Hall, male   DOB: 28-Feb-1956, 59 y.o.   MRN: [id number]  09/18/14.  59 y/o admit for CIR with functional deficits secondary to Right MCA infarct   HPI: Brian Hall is a 59 y.o. right handed male with history of hypertension, diastolic congestive heart failure, diabetes mellitus and peripheral neuropathy and throat cancer with neck dissection and radiation chemotherapy 2011.  Presented 09/12/2014 with left-sided weakness and slurred speech. MRI of the brain showed acute large territory right MCA infarct involving right frontal lobe as well as remote infarcts medial left occipital lobe and left cerebellar hemisphere. 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 Echocardiogram with ejection fraction of 35% no source of embolism. Carotid Dopplers with right ICA occlusion. Patient did not receive TPA. Neurology consulted and maintained on aspirin as well as Plavix for CVA prophylaxis 3 months then Plavix alone. Subcutaneous heparin for DVT prophylaxis.   Comfortable night; no concerns or complaints  ROS Review of Systems  Respiratory: Positive for shortness of breath.  Gastrointestinal: Positive for constipation.   GERD  Musculoskeletal: Positive for myalgias.  All other systems reviewed and are negative   Past Medical History  Diagnosis Date  . Hypertension   . Diabetes mellitus   . Obesity   . CHF (congestive heart failure)   . SOB (shortness of breath)   . Hyperkalemia   . Renal insufficiency   . Cardiogenic shock   . Cardiac LV ejection fraction 10-20%   . Hyperlipemia   . Hypothyroidism   . Throat cancer 2011    s/p neck dissection, radiation, chemo   Past Surgical History  Procedure Laterality Date  . Cardiac catheterization    . Neck  dissection Right 2011    s/p resection of throat cancer with multiple nodes removed   Family History  Problem Relation Age of Onset  . Hypertension Mother   . Hypertension Father    Social History: reports that he has never smoked. He does not have any smokeless tobacco history on file. He reports that he does not drink alcohol or use illicit drugs. Allergies: No Known Allergies Medications Prior to Admission  Medication Sig Dispense Refill  . aspirin EC 81 MG tablet Take 81 mg by mouth daily.    . carvedilol (COREG) 12.5 MG tablet Take 12.5 mg by mouth 2 (two) times daily.  0  . cholecalciferol (VITAMIN D) 400 UNITS TABS tablet Take 400 Units by mouth daily.    Marland Kitchen JANUVIA 100 MG tablet Take 100 mg by mouth daily.  0  . levothyroxine (SYNTHROID, LEVOTHROID) 88 MCG tablet Take 0.88 mcg by mouth daily.    Marland Kitchen lisinopril (PRINIVIL,ZESTRIL) 40 MG tablet Take 40 mg by mouth daily.    . metFORMIN (GLUCOPHAGE-XR) 750 MG 24 hr tablet Take 750 mg by mouth 2 (two) times daily.    . Multiple Vitamins-Minerals (MULTIVITAMIN WITH MINERALS) tablet Take 1 tablet by mouth daily.    Marland Kitchen omeprazole (PRILOSEC) 40 MG capsule Take 40 mg by mouth daily.    . pravastatin (PRAVACHOL) 40 MG tablet Take 40 mg by mouth daily.      Intake/Output Summary (Last 24 hours) at 09/18/14 0835 Last data filed at 09/18/14 0529  Gross per 24 hour  Intake   1320 ml  Output    600 ml  Net    720 ml  Patient Vitals for the past 24 hrs:  BP Temp Temp src Pulse Resp SpO2  09/18/14 0536 (!) 128/93 mmHg 97.5 F (36.4 C) Oral 81 17 98 %  09/17/14 1431 (!) 142/70 mmHg 98.9 F (37.2 C) Oral 74 18 95 %    CBG (last 3)   Recent Labs  09/17/14 1637 09/17/14 2039 09/18/14 0655  GLUCAP 228* 192* 214*   Lab Results  Component Value Date   HGBA1C 7.7* 09/12/2014      Physical Exam: Blood pressure 125/74, pulse 99, temperature 98.8 F (37.1 C), temperature source Oral, resp. rate 18, height 5\' 11"  (1.803 m), weight 113.399 kg (250 lb), SpO2 93 %. Physical Exam Constitutional: He appears well-developed and well-nourished.  HENT: oral mucosa slightly dry. Pink. Dentition fair Head: Normocephalic.  Right Ear: External ear normal.  Left Ear: External ear normal.  Eyes:  Pupils reactive to light  Neck: Normal range of motion. Neck supple. No tracheal deviation present. No thyromegaly present.  Cardiovascular: Normal rate and regular rhythm.no murmur  Respiratory: Effort normal and breath sounds normal. No respiratory distress. no wheezes GI: Soft. Bowel sounds are normal. He exhibits no distension.  Musculoskeletal: He exhibits no edema.  Neurological: He is alert.  Makes good eye contact with examiner. He does have a right gaze preference but will cross midline to the left with prompting. Follows commands. He is oriented to person place and date of birth.  Dense L HP DT'rs 3+ left side. Toes up. Skin: Skin is warm and dry.  Psychiatric: He has a normal mood and affect. His behavior is normal          Imaging Results (Last 48 hours)    Ct Head Wo Contrast  09/14/2014 CLINICAL DATA: LEFT arm weakness has become worse. Symptoms began 09/12/14. EXAM: CT HEAD WITHOUT CONTRAST TECHNIQUE: Contiguous axial images were obtained from the base of the skull through the vertex without intravenous contrast. COMPARISON: CT head 09/12/2014. MRI and MRA 09/13/2014. FINDINGS: Increasing well-defined areas of cytotoxic edema throughout the RIGHT frontal, RIGHT temporal, and RIGHT insular regions also involving the periventricular white matter. There is a larger area of ischemia involving the RIGHT posterior frontal cortex compared with the exam of 09/12/2014 which may correlate with worsening symptoms. Increased cytotoxic edema involvement of  the RIGHT insula as well. No frank lobar hemorrhage although difficult to exclude petechial transformation. No midline shift. Hyperdense RIGHT ICA terminus. Chronic LEFT occipital infarct is stable. IMPRESSION: Compared with the original CT, there are larger areas of acute infarction observed which may correlate with increasing LEFT arm weakness. No frank lobar hemorrhage or midline shift. Electronically Signed By: Rolla Flatten M.D. On: 09/14/2014 08:02        Medical Problem List and Plan:  1. Functional deficits secondary to Right MCA infarct  2. DVT Prophylaxis/Anticoagulation: SQ heparin .Monitor platelet counts and any signs of DVT 3. Pain Management: tylenol as needed 4. Diabetes Mellitus with peripheral neuropathy.HGB A1C 7.7.SSI. Check blood sugars before meals and at bedtime. Patient on Glucophage 750 mg twice daily and Januvia 100 mg daily prior to admission.  Continue Novolog;  Consider basal insulin 5. Hypertension. Lisinopril discontinued 09/16/2014 secondary to mild elevation in creatinine to 1.85..(Patient had also been on Coreg 12.5 mg twice a day prior to admission and lisinopril 40 mg daily) Monitor with increased mobility and resume as needed.  BP stable off meds.  6 .Hypothyroidism.Synthroid 7. Diastolic CHF. Monitor for any signs of fluid overload 8 .Hyperlipidemia. Crestor 9.  GERD.Protonix

## 2014-09-19 ENCOUNTER — Encounter (HOSPITAL_COMMUNITY): Payer: 59 | Admitting: Occupational Therapy

## 2014-09-19 ENCOUNTER — Inpatient Hospital Stay (HOSPITAL_COMMUNITY): Payer: 59 | Admitting: Speech Pathology

## 2014-09-19 ENCOUNTER — Inpatient Hospital Stay (HOSPITAL_COMMUNITY): Payer: 59 | Admitting: Rehabilitation

## 2014-09-19 DIAGNOSIS — R414 Neurologic neglect syndrome: Secondary | ICD-10-CM

## 2014-09-19 LAB — CBC WITH DIFFERENTIAL/PLATELET
BASOS ABS: 0 10*3/uL (ref 0.0–0.1)
BASOS PCT: 0 % (ref 0–1)
Eosinophils Absolute: 0.3 10*3/uL (ref 0.0–0.7)
Eosinophils Relative: 4 % (ref 0–5)
HCT: 44.1 % (ref 39.0–52.0)
HEMOGLOBIN: 15.4 g/dL (ref 13.0–17.0)
Lymphocytes Relative: 21 % (ref 12–46)
Lymphs Abs: 1.7 10*3/uL (ref 0.7–4.0)
MCH: 31.4 pg (ref 26.0–34.0)
MCHC: 34.9 g/dL (ref 30.0–36.0)
MCV: 90 fL (ref 78.0–100.0)
MONO ABS: 0.9 10*3/uL (ref 0.1–1.0)
Monocytes Relative: 11 % (ref 3–12)
NEUTROS PCT: 64 % (ref 43–77)
Neutro Abs: 5 10*3/uL (ref 1.7–7.7)
Platelets: 161 10*3/uL (ref 150–400)
RBC: 4.9 MIL/uL (ref 4.22–5.81)
RDW: 13 % (ref 11.5–15.5)
WBC: 7.9 10*3/uL (ref 4.0–10.5)

## 2014-09-19 LAB — COMPREHENSIVE METABOLIC PANEL
ALBUMIN: 3.5 g/dL (ref 3.5–5.2)
ALT: 31 U/L (ref 0–53)
AST: 29 U/L (ref 0–37)
Alkaline Phosphatase: 59 U/L (ref 39–117)
Anion gap: 6 (ref 5–15)
BUN: 42 mg/dL — ABNORMAL HIGH (ref 6–23)
CALCIUM: 9.1 mg/dL (ref 8.4–10.5)
CO2: 20 mmol/L (ref 19–32)
Chloride: 107 mEq/L (ref 96–112)
Creatinine, Ser: 1.49 mg/dL — ABNORMAL HIGH (ref 0.50–1.35)
GFR calc Af Amer: 59 mL/min — ABNORMAL LOW (ref >90)
GFR, EST NON AFRICAN AMERICAN: 51 mL/min — AB (ref >90)
Glucose, Bld: 243 mg/dL — ABNORMAL HIGH (ref 70–99)
Potassium: 4.4 mmol/L (ref 3.5–5.1)
Sodium: 133 mmol/L — ABNORMAL LOW (ref 135–145)
Total Bilirubin: 1.1 mg/dL (ref 0.3–1.2)
Total Protein: 7.1 g/dL (ref 6.0–8.3)

## 2014-09-19 LAB — GLUCOSE, CAPILLARY
GLUCOSE-CAPILLARY: 275 mg/dL — AB (ref 70–99)
GLUCOSE-CAPILLARY: 222 mg/dL — AB (ref 70–99)
Glucose-Capillary: 208 mg/dL — ABNORMAL HIGH (ref 70–99)
Glucose-Capillary: 245 mg/dL — ABNORMAL HIGH (ref 70–99)

## 2014-09-19 NOTE — Progress Notes (Signed)
Speech Language Pathology Daily Session Note  Patient Details  Name: Brian Hall MRN: [id number] Date of Birth: [birth year]  Today's Date: 09/19/2014 SLP Individual Time: 1445-1530 SLP Individual Time Calculation (min): 45 min  Short Term Goals: Week 1: SLP Short Term Goal 1 (Week 1): Pt will improve visual scanning to the left during structured tasks over 75% of observable opportunities with mod assist  SLP Short Term Goal 2 (Week 1): Pt will improve basic, functional problem solving over 75% of observable opportunities with mod assist  SLP Short Term Goal 3 (Week 1): Pt will improve recall of daily information via use of external aids over 75% of observable opportunities with mod assist  SLP Short Term Goal 4 (Week 1): Pt will sustain attention to basic, functional tasks for 3-5 minutes with mod assist cues for redirection.  SLP Short Term Goal 5 (Week 1): Pt will improve speech intelligibility to >80% at the conversational level via min cues for use of dysarthria strategies.    Skilled Therapeutic Interventions: Skilled treatment session focused on addressing cognition goals.  SLP facilitated session with Mod verbal cues to recall and carryover sequence for bed to chair transfer.  SLP also facilitated session with Min verbal cues to organize and count money accurately.  Patient was also able to identify 3 items that were reasonably with in that price range with Supervision cues.  Continue with current plan of care.    FIM:  Comprehension Comprehension Mode: Auditory Comprehension: 4-Understands basic 75 - 89% of the time/requires cueing 10 - 24% of the time Expression Expression Mode: Verbal Expression: 5-Expresses basic needs/ideas: With extra time/assistive device Social Interaction Social Interaction: 5-Interacts appropriately 90% of the time - Needs monitoring or encouragement for participation or interaction. Problem Solving Problem Solving: 2-Solves basic 25 - 49% of the  time - needs direction more than half the time to initiate, plan or complete simple activities Memory Memory: 3-Recognizes or recalls 50 - 74% of the time/requires cueing 25 - 49% of the time  Pain Pain Assessment Pain Assessment: No/denies pain Pain Score: 0-No pain PAINAD (Pain Assessment in Advanced Dementia) Breathing: normal  Therapy/Group: Individual Therapy  Carmelia Roller., [place] [phone]  [place] 09/19/2014, 5:31 PM

## 2014-09-19 NOTE — Progress Notes (Signed)
Needs assistance with urinal. Continent and incontinent of urinal-Spills urinal at times. Reports checking CBG's twice/week PTA. Per patient, Did not take insulin at home. Wife apprehensive about giving it after discharge. 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 Brian Hall A

## 2014-09-19 NOTE — Progress Notes (Signed)
Physical Therapy Session Note  Patient Details  Name: Brian Hall MRN: [id number] Date of Birth: 02-Nov-1955  Today's Date: 09/19/2014 PT Individual Time: 1045-1200 PT Individual Time Calculation (min): 75 min   Short Term Goals: Week 1:  PT Short Term Goal 1 (Week 1): Pt will transfer supine to edge of bed, edgeof bed to supine with min A.  PT Short Term Goal 2 (Week 1): Pt will perform stand pivot bed to chair, chair to bed transfers with min A.  PT Short Term Goal 3 (Week 1): Pt will ambulate with LRAD about 25 feet with mod A.  PT Short Term Goal 4 (Week 1): Pt will ascend/descend 2 stairs with 1 rail and max A.  PT Short Term Goal 5 (Week 1): Pt will propel w/c about 150 feet with S.   Skilled Therapeutic Interventions/Progress Updates:   Pt received lying in bed, agreeable to therapy this morning.  Assisted with donning pants prior to getting OOB.  Performed rolling at min A level, esp to the R with max verbal cues to attend to LUE throughout.  Pt did well bridging to assist with pulling up pants.  Performed bed mobility at mod A level as he requires assist for BLEs out of bed, but did very well elevating trunk to sitting position.  Once at EOB, provided cues for ensuring proper foot placement prior to transfer also with cues for safety as he was very impulsive when attempting to transfer.  Performed squat pivot transfer at mod/max level with facilitation for forward weight shift.  Pt self propelled to therapy gym x 150' using R hemi technique at min A level.  Pt requires mod to max verbal cues to attend to task and to scan L environment.  Discussed with wife importance of having pt scan L environment for safety.  Once in hallway outside of therapy gym, performed two bouts of gait x 25' x 2 reps with use of R handrail in hallway.  First attempt provided ace wrap on LLE for DF assist and on second rep donned L reaction AFO and utilized mirror for increased visual feed back.  Provided cues  and facilitation for upright posture, esp trunk as he tends to be forward flexed, assist for hip extension, assist for placement of LLE (he was able to advance but would tend to scissor due to slight adductor tone when advancing LLE.   Max cues for safety and to wait for therapist to stabilize L knee prior to advancing RLE.  Then worked on standing weight shifts R and L with use of mirror progressing to advancing and retrostepping RLE to increase weight shift and WB through LLE. Assisted into therapy gym and transferred to/from nustep via squat pivot at mod A level with continued cues for forward weight shift, safety, foot placement.  Performed seated nustep x 4 mins at level 4 resistance with BLEs only for NMR.  Assist to maintain RLE adduction at knee.  Ended session with stair negotiation up/down 3, 4" steps with use of R handrail in step to fashion.  Assist to advance LLE to/from step with max verbal cues for sequencing and technique throughout.  Assisted back to room in w/c and left in w/c with quick release belt donned and family in room.    Therapy Documentation Precautions:  Precautions Precautions: Fall Precaution Comments: left inattention, liable Restrictions Weight Bearing Restrictions: No   Pain: Pt with no reports of pain during session.    Locomotion : Ambulation Ambulation/Gait  Assistance: 2: Max assist;1: +2 Total assist (+2 for chair follow) Wheelchair Mobility Distance: 150   See FIM for current functional status  Therapy/Group: Individual Therapy  Denice Bors 09/19/2014, 12:49 PM

## 2014-09-19 NOTE — Progress Notes (Signed)
Subjective/Complaints: Patient without difficulties overnight. Continues to require assistance for all self-care and mobility. No pain complaints, no bowel or bladder issues  Review of systems is significant for left-sided weakness.  Objective: Vital Signs: Blood pressure 138/89, pulse 84, temperature 98.4 F (36.9 C), temperature source Oral, resp. rate 16, weight 114.669 kg (252 lb 12.8 oz), SpO2 96 %. No results found. Results for orders placed or performed during the hospital encounter of 09/16/14 (from the past 72 hour(s))  Glucose, capillary     Status: Abnormal   Collection Time: 09/16/14  4:47 PM  Result Value Ref Range   Glucose-Capillary 213 (H) 70 - 99 mg/dL   Comment 1 Notify RN   Glucose, capillary     Status: Abnormal   Collection Time: 09/16/14  9:14 PM  Result Value Ref Range   Glucose-Capillary 182 (H) 70 - 99 mg/dL  Glucose, capillary     Status: Abnormal   Collection Time: 09/17/14  6:40 AM  Result Value Ref Range   Glucose-Capillary 209 (H) 70 - 99 mg/dL  Glucose, capillary     Status: Abnormal   Collection Time: 09/17/14  8:46 AM  Result Value Ref Range   Glucose-Capillary 265 (H) 70 - 99 mg/dL  Glucose, capillary     Status: Abnormal   Collection Time: 09/17/14 11:50 AM  Result Value Ref Range   Glucose-Capillary 203 (H) 70 - 99 mg/dL  Glucose, capillary     Status: Abnormal   Collection Time: 09/17/14  4:37 PM  Result Value Ref Range   Glucose-Capillary 228 (H) 70 - 99 mg/dL   Comment 1 Notify RN   Glucose, capillary     Status: Abnormal   Collection Time: 09/17/14  8:39 PM  Result Value Ref Range   Glucose-Capillary 192 (H) 70 - 99 mg/dL  Glucose, capillary     Status: Abnormal   Collection Time: 09/18/14  6:55 AM  Result Value Ref Range   Glucose-Capillary 214 (H) 70 - 99 mg/dL  Glucose, capillary     Status: Abnormal   Collection Time: 09/18/14 11:48 AM  Result Value Ref Range   Glucose-Capillary 263 (H) 70 - 99 mg/dL  Glucose, capillary      Status: Abnormal   Collection Time: 09/18/14  4:23 PM  Result Value Ref Range   Glucose-Capillary 221 (H) 70 - 99 mg/dL  Glucose, capillary     Status: Abnormal   Collection Time: 09/18/14  9:21 PM  Result Value Ref Range   Glucose-Capillary 243 (H) 70 - 99 mg/dL  CBC WITH DIFFERENTIAL     Status: None   Collection Time: 09/19/14  6:26 AM  Result Value Ref Range   WBC 7.9 4.0 - 10.5 K/uL   RBC 4.90 4.22 - 5.81 MIL/uL   Hemoglobin 15.4 13.0 - 17.0 g/dL   HCT 44.1 39.0 - 52.0 %   MCV 90.0 78.0 - 100.0 fL   MCH 31.4 26.0 - 34.0 pg   MCHC 34.9 30.0 - 36.0 g/dL   RDW 13.0 11.5 - 15.5 %   Platelets 161 150 - 400 K/uL   Neutrophils Relative % 64 43 - 77 %   Lymphocytes Relative 21 12 - 46 %   Monocytes Relative 11 3 - 12 %   Eosinophils Relative 4 0 - 5 %   Basophils Relative 0 0 - 1 %   Neutro Abs 5.0 1.7 - 7.7 K/uL   Lymphs Abs 1.7 0.7 - 4.0 K/uL   Monocytes Absolute 0.9  0.1 - 1.0 K/uL   Eosinophils Absolute 0.3 0.0 - 0.7 K/uL   Basophils Absolute 0.0 0.0 - 0.1 K/uL   WBC Morphology ATYPICAL LYMPHOCYTES   Comprehensive metabolic panel     Status: Abnormal   Collection Time: 09/19/14  6:26 AM  Result Value Ref Range   Sodium 133 (L) 135 - 145 mmol/L    Comment: Please note change in reference range.   Potassium 4.4 3.5 - 5.1 mmol/L    Comment: Please note change in reference range.   Chloride 107 96 - 112 mEq/L   CO2 20 19 - 32 mmol/L   Glucose, Bld 243 (H) 70 - 99 mg/dL   BUN 42 (H) 6 - 23 mg/dL   Creatinine, Ser 1.49 (H) 0.50 - 1.35 mg/dL   Calcium 9.1 8.4 - 10.5 mg/dL   Total Protein 7.1 6.0 - 8.3 g/dL   Albumin 3.5 3.5 - 5.2 g/dL   AST 29 0 - 37 U/L   ALT 31 0 - 53 U/L   Alkaline Phosphatase 59 39 - 117 U/L   Total Bilirubin 1.1 0.3 - 1.2 mg/dL   GFR calc non Af Amer 51 (L) >90 mL/min   GFR calc Af Amer 59 (L) >90 mL/min    Comment: (NOTE) The eGFR has been calculated using the CKD EPI equation. This calculation has not been validated in all clinical  situations. eGFR's persistently <90 mL/min signify possible Chronic Kidney Disease.    Anion gap 6 5 - 15  Glucose, capillary     Status: Abnormal   Collection Time: 09/19/14  7:08 AM  Result Value Ref Range   Glucose-Capillary 208 (H) 70 - 99 mg/dL  Glucose, capillary     Status: Abnormal   Collection Time: 09/19/14 12:04 PM  Result Value Ref Range   Glucose-Capillary 245 (H) 70 - 99 mg/dL     HEENT: normal Cardio: RRR and no murmurs Resp: CTA B/L and unlabored GI: BS positive and nontender nondistended Extremity:  No Edema Skin:   Intact Neuro: Alert/Oriented, Cranial Nerve Abnormalities left central 7, Abnormal Sensory decreased sensation to light touch left upper and left lower extremity, Abnormal Motor 0/5 left upper extremity, 3 minus/5 in hip knee extensor synergy left lower extremity 0/5 at the ankle and Inattention Musc/Skel:  Other no pain with left shoulder or left lower extremity range of motion Gen.no acute distress Mood and affect are appropriate  Assessment/Plan: 1. Functional deficits secondary to right MCA infarct which require 3+ hours per day of interdisciplinary therapy in a comprehensive inpatient rehab setting. Physiatrist is providing close team supervision and 24 hour management of active medical problems listed below. Physiatrist and rehab team continue to assess barriers to discharge/monitor patient progress toward functional and medical goals. FIM: FIM - Bathing Bathing Steps Patient Completed: Chest, Abdomen, Front perineal area, Right upper leg, Left upper leg Bathing: 3: Mod-Patient completes 5-7 56f10 parts or 50-74%  FIM - Upper Body Dressing/Undressing Upper body dressing/undressing steps patient completed: Put head through opening of pull over shirt/dress, Thread/unthread right sleeve of pullover shirt/dresss Upper body dressing/undressing: 3: Mod-Patient completed 50-74% of tasks FIM - Lower Body Dressing/Undressing Lower body  dressing/undressing steps patient completed:  (wore jean shorts with fastener) Lower body dressing/undressing: 1: Total-Patient completed less than 25% of tasks  FIM - Toileting Toileting: 0: Activity did not occur  FIM - TAir cabin crewTransfers: 0-Activity did not occur  FIM - BControl and instrumentation engineerDevices:  (HOB flat) Bed/Chair  Transfer: 2: Supine > Sit: Max A (lifting assist/Pt. 25-49%), 2: Sit > Supine: Max A (lifting assist/Pt. 25-49%), 1: Mechanical lift (used Stedy for transfer.  )  FIM - Locomotion: Wheelchair Distance: 100 Locomotion: Wheelchair: 2: Travels 50 - 149 ft with supervision, cueing or coaxing FIM - Locomotion: Ambulation Locomotion: Ambulation Assistive Devices: Other (comment) (hand rail in hallway) Ambulation/Gait Assistance: 2: Max assist Locomotion: Ambulation: 1: Travels less than 50 ft with maximal assistance (Pt: 25 - 49%)  Comprehension Comprehension Mode: Auditory Comprehension: 4-Understands basic 75 - 89% of the time/requires cueing 10 - 24% of the time  Expression Expression Mode: Verbal Expression: 5-Expresses basic needs/ideas: With extra time/assistive device  Social Interaction Social Interaction: 4-Interacts appropriately 75 - 89% of the time - Needs redirection for appropriate language or to initiate interaction.  Problem Solving Problem Solving: 3-Solves basic 50 - 74% of the time/requires cueing 25 - 49% of the time  Memory Memory: 3-Recognizes or recalls 50 - 74% of the time/requires cueing 25 - 49% of the time  Medical Problem List and Plan:  1. Functional deficits secondary to Right MCA infarct due to large Vessel disaese 2. DVT Prophylaxis/Anticoagulation: SQ heparin .Monitor platelet counts and any signs of DVT 3. Pain Management: tylenol as needed 4. Diabetes Mellitus with peripheral neuropathy.HGB A1C 7.7.SSI. Check blood sugars before meals and at bedtime. Patient on Glucophage 750 mg  twice daily and Januvia 100 mg daily prior to admission. Patient was tolerated 5. Neuropsych: This patient is capable of making decisions on his own behalf. 6. Skin/Wound Care: Routine skin checks 7. Fluids/Electrolytes/Nutrition:strict I&O.Follow up labs 8.Hypertension. Lisinopril discontinued 09/16/2014 secondary to mild elevation in creatinine to 1.85..(Patient had also been on Coreg 12.5 mg twice a day prior to admission and lisinopril 40 mg daily) Monitor with increased mobility and resume as needed 9. History throat cancer. Status post dissection with radiation chemotherapy 2011 10.Hypothyroidism.Synthroid 11.Diastolic CHF. Monitor for any signs of fluid overload 12.Hyperlipidemia. Crestor 13.GERD.Protonix  LOS (Days) 3 A FACE TO FACE EVALUATION WAS PERFORMED  KIRSTEINS,ANDREW E 09/19/2014, 12:44 PM

## 2014-09-19 NOTE — Care Management Note (Signed)
[facility] Individual Statement of Services  Patient Name:  Brian Hall  Date:  09/19/2014  Welcome to the [facility].  Our goal is to provide you with an individualized program based on your diagnosis and situation, designed to meet your specific needs.  With this comprehensive rehabilitation program, you will be expected to participate in at least 3 hours of rehabilitation therapies Monday-Friday, with modified therapy programming on the weekends.  Your rehabilitation program will include the following services:  Physical Therapy (PT), Occupational Therapy (OT), Speech Therapy (ST), 24 hour per day rehabilitation nursing, Therapeutic Recreaction (TR), Neuropsychology, Case Management (Social Worker), Rehabilitation Medicine, Nutrition Services and Pharmacy Services  Weekly team conferences will be held on Wednesday to discuss your progress.  Your Social Worker will talk with you frequently to get your input and to update you on team discussions.  Team conferences with you and your family in attendance may also be held.  Expected length of stay: 21-25 days  Overall anticipated outcome: min level  Depending on your progress and recovery, your program may change. Your Social Worker will coordinate services and will keep you informed of any changes. Your Social Worker's name and contact numbers are listed  below.  The following services may also be recommended but are not provided by the [facility] will be made to provide these services after discharge if needed.  Arrangements include referral to agencies that provide these services.  Your insurance has been verified to be:  [place] Your primary doctor is:  Claretta Fraise  Pertinent information will be shared with your doctor and your  insurance company.  Social Worker:  Ovidio Kin, [place] or ([phone]  Information discussed with and copy given to patient by: Elease Hashimoto, 09/19/2014, 10:16 AM

## 2014-09-19 NOTE — Progress Notes (Signed)
Inpatient Diabetes Program Recommendations  AACE/ADA: New Consensus Statement on Inpatient Glycemic Control (2013)  Target Ranges:  Prepandial:   less than 140 mg/dL      Peak postprandial:   less than 180 mg/dL (1-2 hours)      Critically ill patients:  140 - 180 mg/dL   Inpatient Diabetes Program Recommendations Insulin - Basal: Pt may benefit from addition of low dose basal insulin at 10-15 units daily or HS - WHILE HERE  Noted RN note regarding pt and family reluctance to taking insulin at home. HgbA1C is 7.7% which according to AACE/ACE guidelines, does not necessarily recommend insulin initiation. Pt presently on dual therapy at home of Metformin and the DPP4 inhibitor Januvia. Rather than using the DPP4 agent, would recommend change to use a GLP-1RA,, an incretin mimetic as Byetta or Victoza (once a day only) injection. This agent also has the potential benefit of weight loss as well. Risk of hypoglycemia is less than with insulin. Although it is an injectable, pt can be taught how to use at MD's office. Another note on HgbA1C at this time is often elevated following a stroke. Over time, the glucose levels typically lower following recovery. While here, the basal insulin would be helpful to rest his beta cells during this time following the crisis. Thank you, Rosita Kea, RN, CNS, Diabetes Coordinator [phone])

## 2014-09-19 NOTE — Progress Notes (Signed)
Social Work Assessment and Plan Social Work Assessment and Plan  Patient Details  Name: Brian Hall MRN: [id number] Date of Birth: Dec 11, 1955  Today's Date: 09/19/2014  Problem List:  Patient Active Problem List   Diagnosis Date Noted  . Acute ischemic right MCA stroke 09/16/2014  . Left hemiparesis   . CVA (cerebral vascular accident)   . Right carotid artery occlusion   . Acute right MCA stroke   . Essential hypertension 09/12/2014  . CVA (cerebral infarction) 09/12/2014  . Type 2 diabetes mellitus with peripheral neuropathy 09/12/2014   Past Medical History:  Past Medical History  Diagnosis Date  . Hypertension   . Diabetes mellitus   . Obesity   . CHF (congestive heart failure)   . SOB (shortness of breath)   . Hyperkalemia   . Renal insufficiency   . Cardiogenic shock   . Cardiac LV ejection fraction 10-20%   . Hyperlipemia   . Hypothyroidism   . Throat cancer 2011    s/p neck dissection, radiation, chemo   Past Surgical History:  Past Surgical History  Procedure Laterality Date  . Cardiac catheterization    . Neck dissection Right 2011    s/p resection of throat cancer with multiple nodes removed   Social History:  reports that he has never smoked. He does not have any smokeless tobacco history on file. He reports that he does not drink alcohol or use illicit drugs.  Family / Support Systems Marital Status: Married Patient Roles: Spouse, Other (Comment) (Employee) Spouse/Significant Other: [name] [phone] Children: Step son's 33 yo and 3 yo Other Supports: Extended family and friends Anticipated Caregiver: Wife Ability/Limitations of Caregiver: Wife has no limitations and can assist Caregiver Availability: 24/7 Family Dynamics: Close knit family who are there for one another and will support one another.  Wife is aware of the deficits pt has and worried about their finances and how they will manage with him out of work.  Looking into STD, LTD  and SSD.  Social History Preferred language: English Religion:  Cultural Background: No issues Education: High School Read: Yes Write: Yes Employment Status: Employed Name of Employer: [employer] unsure what company Return to Work Plans: Would like too but can't be on insulin and waiting period due to CVA Legal Hisotry/Current Legal Issues: No issues Guardian/Conservator: None-according to MD pt is capable of making his own decisions while here, but wife will be here daily also.   Abuse/Neglect Physical Abuse: Denies Verbal Abuse: Denies Sexual Abuse: Denies Exploitation of patient/patient's resources: Denies Self-Neglect: Denies  Emotional Status Pt's affect, behavior adn adjustment status: Pt is labile and wants to be able to take care of himself and his family.  He takes pride in providing for them and riding his [place].  He works hard and plays hard.  He doesn't know how else to do it.  He will do whatever is in his power to recover from this-pt responds and wife agrees. Recent Psychosocial Issues: other health issues thoguht they were managed until this.  Went to MD every three months to have BS and BP checked. Pyschiatric History: No history would benefit from Neuro-psych to see him while here due to young age and for coping.  Deferred depression screen due to tired from therapy and back hurting.  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  They are hopeful he will do well and recover from this. They both talk with the MD's and feel their questions are being addressed.  Wife has attended therapies with pt today and feel this has helped her see his progress. 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."  [employer]: None Premorbid Home Care/DME Agencies: None Transportation available at discharge: Wife Resource referrals recommended: Neuropsychology, Support group (specify)  Discharge Planning Living Arrangements: Spouse/significant other, Children Support Systems: Spouse/significant other, Children, Other relatives, Parent, Water engineer, Church/faith community Type of Residence: Private residence Insurance Resources: Multimedia programmer (specify) Psychologist, counselling) Financial Resources: Employment Museum/gallery curator Screen Referred: Yes Living Expenses: Higher education careers adviser Management: Patient, Spouse Does the patient have any problems obtaining your medications?: No Home Management: Wife Patient/Family Preliminary Plans: Return home with wife, Joslyn Hy is also there but works fulltime.  Wife will be the primary caregiver of pt at discharge.  Discussing talking with his employer and finding out his benefits, STD, LTD and how long will keep insurance.  Wife plans to begin SSD applicaiton and worker will assist with this.  Social Work Anticipated Follow Up Needs: HH/OP, Support Group  Clinical Impression Pleasant emotional pt who is willing to work hard and recover as much as he can before going home.  His wife is supportive and plans to be here daily to attend therapies with him and learn his care. She is Currently looking into the benefits he has form work and starting the application process for SSD, she is aware it may take awhile.  Will provide support and work on discharge plan.  Elease Hashimoto 09/19/2014, 3:08 PM

## 2014-09-19 NOTE — IPOC Note (Addendum)
Overall Plan of Care [facility]) Patient Details Name: Brian Hall MRN: [id number] DOB: 1956-04-26  Admitting Diagnosis: cva  Hospital Problems: Principal Problem:   Acute ischemic right MCA stroke Active Problems:   Essential hypertension   Type 2 diabetes mellitus with peripheral neuropathy   Left hemiparesis     Functional Problem List: Nursing Pain, Skin Integrity  PT Balance, Endurance, Motor, Safety  OT Balance, Sensory, Skin Integrity, Vision, Cognition, Endurance, Motor, Pain, Perception, Safety  SLP Cognition, Linguistic  TR Activity tolerance, functional mobility, balance, cognition, safety, pain, anxiety/stress        Basic ADL's: OT Eating, Grooming, Bathing, Dressing, Toileting     Advanced  ADL's: OT       Transfers: PT Bed Mobility, Bed to Chair, Teacher, early years/pre, Tub/Shower     Locomotion: PT Stairs, Emergency planning/management officer, Ambulation     Additional Impairments: OT Fuctional Use of Upper Extremity  SLP Communication, Social Cognition expression Problem Solving, Memory, Attention, Awareness  TR      Anticipated Outcomes Item Anticipated Outcome  Self Feeding mod I   Swallowing      Basic self-care  supervision to min A   Toileting  min  A   Bathroom Transfers min A  Bowel/Bladder  Mod I  Transfers  S transfers  Locomotion  min A gait, min A staris, mod I w/c   Communication  Supervision for use of dysarthria strategies   Cognition  Min assist   Pain  <4  Safety/Judgment  Supervision   Therapy Plan: PT Intensity: Minimum of 1-2 x/day ,45 to 90 minutes PT Frequency: 5 out of 7 days PT Duration Estimated Length of Stay: 21 to 25 days OT Intensity: Minimum of 1-2 x/day, 45 to 90 minutes OT Frequency: 5 out of 7 days OT Duration/Estimated Length of Stay: 21-25 days SLP Intensity: Minumum of 1-2 x/day, 30 to 90 minutes SLP Frequency: 5 out of 7 days SLP Duration/Estimated Length of Stay: 21-28 days   TR Duration/ELOS:  3 weeks TR  Frequency:  Min 1 time per week >20 minutes        Team Interventions: Nursing Interventions Patient/Family Education, Pain Management, Discharge Planning, Disease Management/Prevention  PT interventions Ambulation/gait training, Community reintegration, Discharge planning, Training and development officer, Functional mobility training, Functional electrical stimulation, DME/adaptive equipment instruction, Neuromuscular re-education, Pain management, Splinting/orthotics, Patient/family education, Psychosocial support, Stair training, Therapeutic Activities, Therapeutic Exercise, UE/LE Coordination activities, UE/LE Strength taining/ROM, Wheelchair propulsion/positioning  OT Interventions Training and development officer, Cognitive remediation/compensation, Academic librarian, Engineer, drilling, Disease mangement/prevention, Discharge planning, Functional electrical stimulation, Pain management, Self Care/advanced ADL retraining, Therapeutic Activities, UE/LE Coordination activities, Visual/perceptual remediation/compensation, Therapeutic Exercise, Patient/family education, Neuromuscular re-education, Psychosocial support, Splinting/orthotics, UE/LE Strength taining/ROM, Wheelchair propulsion/positioning  SLP Interventions Cognitive remediation/compensation, English as a second language teacher, Patient/family education, Speech/Language facilitation, Oral motor exercises, Functional tasks, Internal/external aids, Environmental controls  TR Interventions Recreation/leisure participation, Balance/Vestibular training, functional mobility, therapeutic activities, UE/LE strength/coordination, w/c mobility, community reintegration, pt/family education, adaptive equipment instruction/use, discharge planning, psychosocial support, cognitive retraining/compensation  SW/CM Interventions Discharge Planning, Barrister's clerk, Patient/Family Education    Team Discharge Planning: Destination: PT-Home ,OT- Home , SLP-  (TBD) Projected Follow-up: PT-Home health PT, OT-  Home health OT, Outpatient OT, SLP-Home Health SLP, 24 hour supervision/assistance, Outpatient SLP, Skilled Nursing facility Projected Equipment Needs: PT-To be determined, OT- To be determined, SLP-None recommended by SLP Equipment Details: PT- , OT-  Patient/family involved in discharge planning: PT- Patient,  OT-Patient, SLP-Patient, Family member/caregiver  MD ELOS: 18-24d Medical Rehab Prognosis:  Good Assessment: 59 y.o. right handed male with history of hypertension, diastolic congestive heart failure, diabetes mellitus and peripheral neuropathy and throat cancer with neck dissection and radiation chemotherapy 2011. Patient lives with his wife and was independent prior to admission. Presented 09/12/2014 with left-sided weakness and slurred speech. MRI of the brain showed acute large territory right MCA infarct involving right frontal lobe as well as remote infarcts medial left occipital lobe and left cerebellar hemisphere. 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 Echocardiogram with ejection fraction of 35% no source of embolism. Carotid Dopplers with right ICA occlusion. Patient did not receive TPA. Neurology consulted and maintained on aspirin as well as Plavix for CVA prophylaxis 3 months then Plavix alone. Subcutaneous heparin for DVT prophylaxis  Now requiring 24/7 Rehab RN,MD, as well as CIR level PT, OT and SLP.  Treatment team will focus on ADLs and mobility with goals set at [place]   See Team Conference Notes for weekly updates to the plan of care

## 2014-09-19 NOTE — Progress Notes (Signed)
Occupational Therapy Session Note  Patient Details  Name: Brian Hall MRN: [id number] Date of Birth: 1956-08-03  Today's Date: 09/19/2014 OT Individual Time: 1300-1400 OT Individual Time Calculation (min): 60 min    Short Term Goals: Week 1:  OT Short Term Goal 1 (Week 1): Pt will transfer to BSC/toilet with mod A  OT Short Term Goal 2 (Week 1): Pt will don shirt with mod A with instructional cues OT Short Term Goal 3 (Week 1): Pt will perform sit to stand for clothing mangement with min  A OT Short Term Goal 4 (Week 1): Pt will attend to left visual field durning ADL (31min session) with mod cuing   Skilled Therapeutic Interventions/Progress Updates:    Pt performed shower, grooming, and dressing during session.  Total assist for stand pivot transfer from the wheelchair to the tub bench.  Pt slightly impulsive during transfer reaching out for grab bars and demonstrating decreased forward weightshift for transition sit to stand.  He needed total assist for integration of the LUE into bathing but does exhibit some trace shoulder movements.  Total assist for sit to stand to wash his bottom as well.  Transferred back to the wheelchair with total assist stand pivot as well.  Mod instructional cueing to remember to lock and unlock the wheelchair brakes on the left side throughout session.  He was able to perform UB dressing with min assist following hemi-dressing techniques.  Max assist for dressing the LLE with brief and pants and for sit to stand to pull them up over his hips.  Pt with increased forward lean on the sink in standing when attempting to pull them up.  Discussed need for better shoes to support left AFO as well.  Pt's spouse to bring in.  Total assist needed for TEDs and left shoe with AFO.  He completed grooming tasks of brushing his hair and his teeth with supervision.   Therapy Documentation Precautions:  Precautions Precautions: Fall Precaution Comments: left inattention,  liable Restrictions Weight Bearing Restrictions: No  Pain: Pain Assessment Pain Assessment: No/denies pain ADL: See FIM for current functional status  Therapy/Group: Individual Therapy  Venezia Sargeant OTR/L 09/19/2014, 3:47 PM

## 2014-09-20 ENCOUNTER — Encounter (HOSPITAL_COMMUNITY): Payer: 59 | Admitting: Occupational Therapy

## 2014-09-20 ENCOUNTER — Inpatient Hospital Stay (HOSPITAL_COMMUNITY): Payer: 59 | Admitting: Physical Therapy

## 2014-09-20 ENCOUNTER — Inpatient Hospital Stay (HOSPITAL_COMMUNITY): Payer: 59 | Admitting: Speech Pathology

## 2014-09-20 LAB — GLUCOSE, CAPILLARY
Glucose-Capillary: 235 mg/dL — ABNORMAL HIGH (ref 70–99)
Glucose-Capillary: 260 mg/dL — ABNORMAL HIGH (ref 70–99)
Glucose-Capillary: 202 mg/dL — ABNORMAL HIGH (ref 70–99)
Glucose-Capillary: 224 mg/dL — ABNORMAL HIGH (ref 70–99)

## 2014-09-20 MED ORDER — LINAGLIPTIN 5 MG PO TABS
5.0000 mg | ORAL_TABLET | Freq: Every day | ORAL | Status: DC
  Administered 2014-09-20 – 2014-10-14 (×25): 5 mg via ORAL
  Filled 2014-09-20 (×26): qty 1

## 2014-09-20 NOTE — Progress Notes (Signed)
Patient information reviewed and entered into eRehab system by Ilea Hilton, RN, CRRN, PPS Coordinator.  Information including medical coding and functional independence measure will be reviewed and updated through discharge.    

## 2014-09-20 NOTE — Progress Notes (Signed)
Speech Language Pathology Daily Session Note  Patient Details  Name: Brian Hall MRN: [id number] Date of Birth: [birth year]  Today's Date: 09/20/2014 SLP Individual Time: 0930-1030 SLP Individual Time Calculation (min): 60 min  Short Term Goals: Week 1: SLP Short Term Goal 1 (Week 1): Pt will improve visual scanning to the left during structured tasks over 75% of observable opportunities with mod assist  SLP Short Term Goal 2 (Week 1): Pt will improve basic, functional problem solving over 75% of observable opportunities with mod assist  SLP Short Term Goal 3 (Week 1): Pt will improve recall of daily information via use of external aids over 75% of observable opportunities with mod assist  SLP Short Term Goal 4 (Week 1): Pt will sustain attention to basic, functional tasks for 3-5 minutes with mod assist cues for redirection.  SLP Short Term Goal 5 (Week 1): Pt will improve speech intelligibility to >80% at the conversational level via min cues for use of dysarthria strategies.    Skilled Therapeutic Interventions: Skilled treatment session focused on addressing cognition goals.  SLP facilitated session with Min verbal cues to recall and carryover sequence for bed mobility and bed to chair transfer.  SLP also facilitated session with Mod faded to [place] verbal cues for recall of new learning task.  SLP's implementation of an external aid assist in carryover and patient used this with Min cues during session.  Patient was also able to identify current deficits and required overall Mod verbal cues to identify current goals of therapy sessions.  Patient became tearful with discussion regarding anticipatory awareness and long term implications of CVA, indicating increased awareness and more real expectations.  Continue with current plan of care.   FIM:  Comprehension Comprehension Mode: Auditory Comprehension: 4-Understands basic 75 - 89% of the time/requires cueing 10 - 24% of the  time Expression Expression Mode: Verbal Expression: 5-Expresses basic needs/ideas: With extra time/assistive device Social Interaction Social Interaction: 5-Interacts appropriately 90% of the time - Needs monitoring or encouragement for participation or interaction. Problem Solving Problem Solving: 3-Solves basic 50 - 74% of the time/requires cueing 25 - 49% of the time Memory Memory: 4-Recognizes or recalls 75 - 89% of the time/requires cueing 10 - 24% of the time  Pain Pain Assessment Pain Assessment: No/denies pain  Therapy/Group: Individual Therapy  Carmelia Roller., [place] [phone]  [place] 09/20/2014, 12:28 PM

## 2014-09-20 NOTE — Progress Notes (Signed)
Physical Therapy Session Note  Patient Details  Name: Brian Hall MRN: [id number] Date of Birth: 1956/05/19  Today's Date: 09/20/2014 PT Individual Time: 1030-1130 PT Individual Time Calculation (min): 60 min   Short Term Goals: Week 1:  PT Short Term Goal 1 (Week 1): Pt will transfer supine to edge of bed, edgeof bed to supine with min A.  PT Short Term Goal 2 (Week 1): Pt will perform stand pivot bed to chair, chair to bed transfers with min A.  PT Short Term Goal 3 (Week 1): Pt will ambulate with LRAD about 25 feet with mod A.  PT Short Term Goal 4 (Week 1): Pt will ascend/descend 2 stairs with 1 rail and max A.  PT Short Term Goal 5 (Week 1): Pt will propel w/c about 150 feet with S.   Skilled Therapeutic Interventions/Progress Updates:   Pt received from SLP session and performed 60' w/c propulsion using R hemi technique at mod A level.  Requires demonstration and HOH cues for correct propulsion technique and to attend to the L to avoid obstacles.  Once in therapy gym, performed squat pivot transfer w/c<>mat table at mod A level.  Pt doing very well with forward weight shift and elevating buttocks during transfer.  Some assist needed for LLE placement and safety cues throughout, but continues to improve on transfers daily.  Once seated at [facility], activities focused on sit<>stands with use of mirror for midline orientation, weight shifts, upright posture, reaching to facilitate WB through LLE, squats to facilitate glute/quad strength and reaching to the R from sit>stand in order to activate LLE in weight shift over to the R.  Pt requires max A to +2 assist for dynamic NMR tasks throughout session in order to assist with posture, trunk rotation and WB, but continued to demonstrate improvement with tasks throughout.  Ended session with three bouts of gait/NMR in hallway via "three muskateer style" with use of L reaction AFO for DF assist (donned shoe cover to assist during last bout) x 40', 45',  and another 45'.  Provided assist for upright posture, increased hip extension, trunk extension, L quad activation and stabilization of L knee, weight shift forward and over LLE and also weight shift to the R to increase clearance of LLE.  Also utilized ipad to have to be able to see what his body is doing during task for better carryover during session.  Did note that pt better able to weight shift and maintain upright posture during second and third rounds and during third round, pt able to control L knee on his own with cues for activation on each step.  Pt assisted back to w/c and back to room.  Left in w/c in room with quick release belt donned and all needs in reach.   Therapy Documentation Precautions:  Precautions Precautions: Fall Precaution Comments: left inattention, liable Restrictions Weight Bearing Restrictions: No   Pain: No c/o pain during session.    Locomotion : Ambulation Ambulation/Gait Assistance: 1: +2 Total assist Wheelchair Mobility Distance: 60   See FIM for current functional status  Therapy/Group: Individual Therapy  Denice Bors 09/20/2014, 12:09 PM

## 2014-09-20 NOTE — Progress Notes (Signed)
Inpatient Diabetes Program Recommendations  AACE/ADA: New Consensus Statement on Inpatient Glycemic Control (2013)  Target Ranges:  Prepandial:   less than 140 mg/dL      Peak postprandial:   less than 180 mg/dL (1-2 hours)      Critically ill patients:  140 - 180 mg/dL   Glucose remain elevated in 200's Addition of basal lantus or levemir may help normalize the fasting glucose to start the day with a better cbg and then better throughout the day. Inpatient Diabetes Program Recommendations Insulin - Basal: Pt may benefit from addition of low dose basal insulin at 10-15 units daily or HS - WHILE HERE   Thank you, Rosita Kea, RN, CNS, Diabetes Coordinator [phone])

## 2014-09-20 NOTE — Plan of Care (Signed)
Problem: RH SAFETY Goal: RH STG ADHERE TO SAFETY PRECAUTIONS W/ASSISTANCE/DEVICE STG Adhere to Safety Precautions With Assistance/Device. Supervision  Outcome: Progressing No unsafe behavior noted or reported

## 2014-09-20 NOTE — Progress Notes (Signed)
Occupational Therapy Session Note  Patient Details  Name: Brian Hall MRN: [id number] Date of Birth: December 12, 1955  Today's Date: 09/20/2014 OT Individual Time: 1300-1400 OT Individual Time Calculation (min): 60 min    Skilled Therapeutic Interventions/Progress Updates:    Pt went down to the gym to work on neuromuscular re-education for the trunk and LUE.  Pt transferred squat pivot to the mat with mod assist.  Once on the mat placed LUE in weightbearing and worked on reaching across his body with the RUE to place and reach for clothespins.  Focused on weightbearing through the LUE to maintain balance when reaching across.  Pt needs mod facilitation to maintain left hand in contact with the surface and for regaining his balance.  Slight increased internal rotation noted in the UE when attempting to extend the left elbow.  Transitioned to lateral reaching on the right side to promote weightshifts and left trunk active shortening.  Progressed to working on reciprical scooting.  Pt with active trunk shortening on the left side but needs max instructional cueing to complete, secondary to pt being impulsive and not processing the steps.  Pt eventually able to perform scooting with min assist on the left side with decreased right side overactivity.  Finished session with scooting up the mat with emphasis on equal weightbearing between the LEs.    Therapy Documentation Precautions:  Precautions Precautions: Fall Precaution Comments: left inattention, liable Restrictions Weight Bearing Restrictions: No  Pain: Pain Assessment Pain Assessment: No/denies pain ADL: See FIM for current functional status  Therapy/Group: Individual Therapy  Wenonah Milo OTR/L 09/20/2014, 3:52 PM

## 2014-09-20 NOTE — Progress Notes (Signed)
Subjective/Complaints: My leg is getting stronger No pain C/os Appetite is good No pain complaints, no bowel or bladder issues  Review of systems is significant for left-sided weakness.  Objective: Vital Signs: Blood pressure 122/73, pulse 85, temperature 98 F (36.7 C), temperature source Oral, resp. rate 19, weight 114.669 kg (252 lb 12.8 oz), SpO2 96 %. No results found. Results for orders placed or performed during the hospital encounter of 09/16/14 (from the past 72 hour(s))  Glucose, capillary     Status: Abnormal   Collection Time: 09/17/14  8:46 AM  Result Value Ref Range   Glucose-Capillary 265 (H) 70 - 99 mg/dL  Glucose, capillary     Status: Abnormal   Collection Time: 09/17/14 11:50 AM  Result Value Ref Range   Glucose-Capillary 203 (H) 70 - 99 mg/dL  Glucose, capillary     Status: Abnormal   Collection Time: 09/17/14  4:37 PM  Result Value Ref Range   Glucose-Capillary 228 (H) 70 - 99 mg/dL   Comment 1 Notify RN   Glucose, capillary     Status: Abnormal   Collection Time: 09/17/14  8:39 PM  Result Value Ref Range   Glucose-Capillary 192 (H) 70 - 99 mg/dL  Glucose, capillary     Status: Abnormal   Collection Time: 09/18/14  6:55 AM  Result Value Ref Range   Glucose-Capillary 214 (H) 70 - 99 mg/dL  Glucose, capillary     Status: Abnormal   Collection Time: 09/18/14 11:48 AM  Result Value Ref Range   Glucose-Capillary 263 (H) 70 - 99 mg/dL  Glucose, capillary     Status: Abnormal   Collection Time: 09/18/14  4:23 PM  Result Value Ref Range   Glucose-Capillary 221 (H) 70 - 99 mg/dL  Glucose, capillary     Status: Abnormal   Collection Time: 09/18/14  9:21 PM  Result Value Ref Range   Glucose-Capillary 243 (H) 70 - 99 mg/dL  CBC WITH DIFFERENTIAL     Status: None   Collection Time: 09/19/14  6:26 AM  Result Value Ref Range   WBC 7.9 4.0 - 10.5 K/uL   RBC 4.90 4.22 - 5.81 MIL/uL   Hemoglobin 15.4 13.0 - 17.0 g/dL   HCT 44.1 39.0 - 52.0 %   MCV 90.0  78.0 - 100.0 fL   MCH 31.4 26.0 - 34.0 pg   MCHC 34.9 30.0 - 36.0 g/dL   RDW 13.0 11.5 - 15.5 %   Platelets 161 150 - 400 K/uL   Neutrophils Relative % 64 43 - 77 %   Lymphocytes Relative 21 12 - 46 %   Monocytes Relative 11 3 - 12 %   Eosinophils Relative 4 0 - 5 %   Basophils Relative 0 0 - 1 %   Neutro Abs 5.0 1.7 - 7.7 K/uL   Lymphs Abs 1.7 0.7 - 4.0 K/uL   Monocytes Absolute 0.9 0.1 - 1.0 K/uL   Eosinophils Absolute 0.3 0.0 - 0.7 K/uL   Basophils Absolute 0.0 0.0 - 0.1 K/uL   WBC Morphology ATYPICAL LYMPHOCYTES   Comprehensive metabolic panel     Status: Abnormal   Collection Time: 09/19/14  6:26 AM  Result Value Ref Range   Sodium 133 (L) 135 - 145 mmol/L    Comment: Please note change in reference range.   Potassium 4.4 3.5 - 5.1 mmol/L    Comment: Please note change in reference range.   Chloride 107 96 - 112 mEq/L   CO2 20 19 -  32 mmol/L   Glucose, Bld 243 (H) 70 - 99 mg/dL   BUN 42 (H) 6 - 23 mg/dL   Creatinine, Ser 1.49 (H) 0.50 - 1.35 mg/dL   Calcium 9.1 8.4 - 10.5 mg/dL   Total Protein 7.1 6.0 - 8.3 g/dL   Albumin 3.5 3.5 - 5.2 g/dL   AST 29 0 - 37 U/L   ALT 31 0 - 53 U/L   Alkaline Phosphatase 59 39 - 117 U/L   Total Bilirubin 1.1 0.3 - 1.2 mg/dL   GFR calc non Af Amer 51 (L) >90 mL/min   GFR calc Af Amer 59 (L) >90 mL/min    Comment: (NOTE) The eGFR has been calculated using the CKD EPI equation. This calculation has not been validated in all clinical situations. eGFR's persistently <90 mL/min signify possible Chronic Kidney Disease.    Anion gap 6 5 - 15  Glucose, capillary     Status: Abnormal   Collection Time: 09/19/14  7:08 AM  Result Value Ref Range   Glucose-Capillary 208 (H) 70 - 99 mg/dL  Glucose, capillary     Status: Abnormal   Collection Time: 09/19/14 12:04 PM  Result Value Ref Range   Glucose-Capillary 245 (H) 70 - 99 mg/dL  Glucose, capillary     Status: Abnormal   Collection Time: 09/19/14  4:26 PM  Result Value Ref Range    Glucose-Capillary 275 (H) 70 - 99 mg/dL  Glucose, capillary     Status: Abnormal   Collection Time: 09/19/14  8:33 PM  Result Value Ref Range   Glucose-Capillary 222 (H) 70 - 99 mg/dL  Glucose, capillary     Status: Abnormal   Collection Time: 09/20/14  6:26 AM  Result Value Ref Range   Glucose-Capillary 235 (H) 70 - 99 mg/dL     HEENT: normal Cardio: RRR and no murmurs Resp: CTA B/L and unlabored GI: BS positive and nontender nondistended Extremity:  No Edema Skin:   Intact Neuro: Alert/Oriented, Cranial Nerve Abnormalities left central 7, Abnormal Sensory decreased sensation to light touch left upper and left lower extremity, Abnormal Motor 0/5 left upper extremity, 3 minus/5 in hip knee extensor synergy left lower extremity 0/5 at the ankle and Inattention Musc/Skel:  Other no pain with left shoulder or left lower extremity range of motion Gen.no acute distress Mood and affect are appropriate  Assessment/Plan: 1. Functional deficits secondary to right MCA infarct which require 3+ hours per day of interdisciplinary therapy in a comprehensive inpatient rehab setting. Physiatrist is providing close team supervision and 24 hour management of active medical problems listed below. Physiatrist and rehab team continue to assess barriers to discharge/monitor patient progress toward functional and medical goals. FIM: FIM - Bathing Bathing Steps Patient Completed: Chest, Abdomen, Front perineal area, Right upper leg, Left upper leg, Left Arm Bathing: 3: Mod-Patient completes 5-7 57f10 parts or 50-74%  FIM - Upper Body Dressing/Undressing Upper body dressing/undressing steps patient completed: Put head through opening of pull over shirt/dress, Thread/unthread right sleeve of pullover shirt/dresss, Thread/unthread left sleeve of pullover shirt/dress Upper body dressing/undressing: 4: Min-Patient completed 75 plus % of tasks FIM - Lower Body Dressing/Undressing Lower body dressing/undressing  steps patient completed: Thread/unthread right pants leg, Don/Doff right shoe Lower body dressing/undressing: 2: Max-Patient completed 25-49% of tasks  FIM - Toileting Toileting: 0: Activity did not occur  FIM - TAir cabin crewTransfers: 0-Activity did not occur  FIM - BControl and instrumentation engineerDevices: Arm rests Bed/Chair Transfer: 3:  Supine > Sit: Mod A (lifting assist/Pt. 50-74%/lift 2 legs, 2: Bed > Chair or W/C: Max A (lift and lower assist), 2: Chair or W/C > Bed: Max A (lift and lower assist)  FIM - Locomotion: Wheelchair Distance: 150 Locomotion: Wheelchair: 4: Travels 150 ft or more: maneuvers on rugs and over door sillls with minimal assistance (Pt.>75%) FIM - Locomotion: Ambulation Locomotion: Ambulation Assistive Devices: Other (comment) (handrail in hallway) Ambulation/Gait Assistance: 2: Max assist, 1: +2 Total assist (+2 for chair follow) Locomotion: Ambulation: 1: Two helpers  Comprehension Comprehension Mode: Auditory Comprehension: 4-Understands basic 75 - 89% of the time/requires cueing 10 - 24% of the time  Expression Expression Mode: Verbal Expression: 5-Expresses basic needs/ideas: With extra time/assistive device  Social Interaction Social Interaction: 5-Interacts appropriately 90% of the time - Needs monitoring or encouragement for participation or interaction.  Problem Solving Problem Solving: 2-Solves basic 25 - 49% of the time - needs direction more than half the time to initiate, plan or complete simple activities  Memory Memory: 3-Recognizes or recalls 50 - 74% of the time/requires cueing 25 - 49% of the time  Medical Problem List and Plan:  1. 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 DVT Prophylaxis/Anticoagulation: SQ heparin .Monitor platelet counts and any signs of DVT 3. Pain Management: tylenol as needed 4. Diabetes Mellitus uncontrolledwith peripheral neuropathy.HGB  A1C 7.7.SSI. Check blood sugars before meals and at bedtime. Patient on Glucophage 750 mg twice daily and Januvia 100 mg daily prior to admission. Adjust meds       5. Neuropsych: This patient is capable of making decisions on his own behalf. 6. Skin/Wound Care: Routine skin checks 7. Fluids/Electrolytes/Nutrition:strict I&O.Follow up labs 8.Hypertension. Lisinopril discontinued 09/16/2014 secondary to mild elevation in creatinine to 1.85..(Patient had also been on Coreg 12.5 mg twice a day prior to admission and lisinopril 40 mg daily) Monitor with increased mobility and resume as needed 9. History throat cancer. Status post dissection with radiation chemotherapy 2011 10.Hypothyroidism.Synthroid 11.Diastolic CHF. Monitor for any signs of fluid overload 12.Hyperlipidemia. Crestor 13.GERD.Protonix  LOS (Days) 4 A FACE TO FACE EVALUATION WAS PERFORMED  Brian Hall 09/20/2014, 8:15 AM

## 2014-09-20 NOTE — Plan of Care (Signed)
Problem: RH PAIN MANAGEMENT Goal: RH STG PAIN MANAGED AT OR BELOW PT'S PAIN GOAL <4  Outcome: Progressing No c/o pain     

## 2014-09-21 ENCOUNTER — Encounter (HOSPITAL_COMMUNITY): Payer: 59 | Admitting: Physical Therapy

## 2014-09-21 ENCOUNTER — Inpatient Hospital Stay (HOSPITAL_COMMUNITY): Payer: 59 | Admitting: Physical Therapy

## 2014-09-21 ENCOUNTER — Inpatient Hospital Stay (HOSPITAL_COMMUNITY): Payer: 59 | Admitting: Speech Pathology

## 2014-09-21 LAB — GLUCOSE, CAPILLARY
Glucose-Capillary: 261 mg/dL — ABNORMAL HIGH (ref 70–99)
Glucose-Capillary: 308 mg/dL — ABNORMAL HIGH (ref 70–99)
Glucose-Capillary: 182 mg/dL — ABNORMAL HIGH (ref 70–99)
Glucose-Capillary: 287 mg/dL — ABNORMAL HIGH (ref 70–99)

## 2014-09-21 MED ORDER — INSULIN GLARGINE 100 UNIT/ML ~~LOC~~ SOLN
10.0000 [IU] | Freq: Every day | SUBCUTANEOUS | Status: DC
  Administered 2014-09-21 – 2014-10-02 (×12): 10 [IU] via SUBCUTANEOUS
  Filled 2014-09-21 (×13): qty 0.1

## 2014-09-21 MED ORDER — TRAMADOL HCL 50 MG PO TABS
50.0000 mg | ORAL_TABLET | Freq: Four times a day (QID) | ORAL | Status: DC | PRN
  Administered 2014-09-21 – 2014-10-14 (×35): 50 mg via ORAL
  Filled 2014-09-21 (×37): qty 1

## 2014-09-21 NOTE — Progress Notes (Signed)
Occupational Therapy Session Note  Patient Details  Name: Brian Hall MRN: [id number] Date of Birth: 05/19/1956  Today's Date: 09/21/2014 OT Individual Time: 1517-6160 OT Individual Time Calculation (min): 60 min    Short Term Goals: Week 1:  OT Short Term Goal 1 (Week 1): Pt will transfer to BSC/toilet with mod A  OT Short Term Goal 2 (Week 1): Pt will don shirt with mod A with instructional cues OT Short Term Goal 3 (Week 1): Pt will perform sit to stand for clothing mangement with min  A OT Short Term Goal 4 (Week 1): Pt will attend to left visual field durning ADL (70min session) with mod cuing   Skilled Therapeutic Interventions/Progress Updates:  Upon entering the room, pt supine in bed with wife present. Pt with 3/10 c/o low back pain this session. Pt transferred from bed >wheechair with Mod A squat pivot transfer. Once seated in wheelchair pt requesting bathing at sink side this session. Skilled OT session with focus on self care retraining, STS, functional transfer, and pt education. Pt required mod verbal cues and demonstration for hemiplegic dressing and hygiene techniques in order to get L UE involved in tasks. Pt performed sit > stand inside of STEDY with Mod A for wheelchair and Min A to stand once in STEDY. Pt completing STS x 8 reps for bathing and LB dressing tasks. Pt utilized mirror in order to correct posture and orient self more to midline. Pt requiring Max verbal cues for awareness of L UE during transfers and STS tasks for safety. Pt appearing to be emotional during session and crying at times when talking about job and motorcycle riding. Pt showing poor insight and judgment when discussing he was going back to work soon and how he was going to modify his motorcycle to ride when he gets home. Pt seated in wheelchair with call bell within reach and wife present in room upon exiting.   Therapy Documentation Precautions:  Precautions Precautions: Fall Precaution  Comments: left inattention, liable Restrictions Weight Bearing Restrictions: No  Pain: Pain Assessment Pain Assessment: 0-10 Pain Score: 5  Pain Type: Chronic pain Pain Location: Back Pain Descriptors / Indicators: Aching Pain Onset: On-going Patients Stated Pain Goal: 4 Pain Intervention(s): Repositioned (in recliner) ADL: ADL ADL Comments: see FIM  See FIM for current functional status  Therapy/Group: Individual Therapy  [name] 09/21/2014, 11:20 AM

## 2014-09-21 NOTE — Patient Care Conference (Signed)
Inpatient RehabilitationTeam Conference and Plan of Care Update Date: 09/21/2014   Time: 11;45 AM    Patient Name: Brian Hall      Medical Record Number: [id number]  Date of Birth: 1956/03/28 Sex: Male         Room/Bed: 4W06C/4W06C-01 Payor Info: Payor: [employer] / Plan: Electrical engineer / Product Type: *No Product type* /    Admitting Diagnosis: cva  Admit Date/Time:  09/16/2014  3:20 PM Admission Comments: No comment available   Primary Diagnosis:  Acute ischemic right MCA stroke Principal Problem: Acute ischemic right MCA stroke  Patient Active Problem List   Diagnosis Date Noted  . Acute ischemic right MCA stroke 09/16/2014  . Left hemiparesis   . CVA (cerebral vascular accident)   . Right carotid artery occlusion   . Acute right MCA stroke   . Essential hypertension 09/12/2014  . CVA (cerebral infarction) 09/12/2014  . Type 2 diabetes mellitus with peripheral neuropathy 09/12/2014    Expected Discharge Date: Expected Discharge Date: 10/14/14  Team Members Present: Physician leading conference: Dr. Alysia Penna Social Worker Present: Ovidio Kin, LCSW Nurse Present: Heather Roberts, RN PT Present: Cameron Sprang, PT;Tijuan Dantes Jari Favre, PT OT Present: Benay Pillow, Maryella Shivers, OT SLP Present: Gunnar Fusi, SLP PPS Coordinator present : Daiva Nakayama, RN, CRRN     Current Status/Progress Goal Weekly Team Focus  Medical   rapid mobility gains, mod Left neglect  Home d/c  cue for left arm managemenmt   Bowel/Bladder   Continent of bowel and bladder. Pt can spill urine at times due to volume in urinal. LBM 09/19/14  Managed bowel and bladder  Monitor   Swallow/Nutrition/ Hydration   heart healthy and thin intermittent supervision due to cognition          ADL's   mod assist for UB bathing and dressing, max assist for LB bathing and dressing.  Mod to max assist for squat pivot transfers with max assist for stand pivots as well.  Brunnstrum stage II movement in the left  arm an stage I in the hand.  Pt implulsive at times and just throws himself around without thinking through the steps of a movement.  supervision to min assist level  selfcare re-training, transfer training, neuromuscular re-education, pt/family education, therapeutic exercise.    Mobility   mod/max A for bed mobility, mod/max A for squat pivot transfers, mod A sit<>stand, +2 for dynamic standing tasks, as well as gait.  Pt impaired by L inattention, decreased awareness and lability during sessions.   min A overall  LUE/LE NMR, sitting and standing balance, gait, midline orientation, pt/family education   Communication   Min assist   Supervision   increase use of compensatory strategies    Safety/Cognition/ Behavioral Observations  Min-Mod assist  Min assist   continue to increase left attention, emergent awareness and overall recall and carryover   Pain   No c/o pain  <3  Monitor for nonverbal cue of pain   Skin   CDI  CDI  Encourage turn q 2hrs      *See Care Plan and progress notes for long and short-term goals.  Barriers to Discharge: Still needs physical assist    Possible Resolutions to Barriers:  train wife    Discharge Planning/Teaching Needs:  Home with wife who can provide care-applying for SSD.  Concerns regarding finances.      Team Discussion:  Goals -supervision/min level, doing well and making good progress.  Nerve pain less due to stroke.  Would benefit from Neuro-psych to see.  Working on mid-line, balance and orientation.  Revisions to Treatment Plan:  None   Continued Need for Acute Rehabilitation Level of Care: The patient requires daily medical management by a physician with specialized training in physical medicine and rehabilitation for the following conditions: Daily direction of a multidisciplinary physical rehabilitation program to ensure safe treatment while eliciting the highest outcome that is of practical value to the patient.: Yes Daily medical  management of patient stability for increased activity during participation in an intensive rehabilitation regime.: Yes Daily analysis of laboratory values and/or radiology reports with any subsequent need for medication adjustment of medical intervention for : Neurological problems  Kaelene Elliston, [name] 09/21/2014, 1:24 PM

## 2014-09-21 NOTE — Progress Notes (Signed)
Social Work Patient ID: Brian Hall, male   DOB: [birth year], 57 y.o.   MRN: [id number] Met with pt and wife to discuss team conference goals -supervision/min level and the progress team feels he is making daily.  Targeted discharge date 2/12. Pt is hopeful he will do well here and make good progress.  He states: " What a good Valentine's gift."  Wife is very encouraging and supportive and attends therapies with him. Will work on discharge plans.

## 2014-09-21 NOTE — Progress Notes (Signed)
Physical Therapy Session Note  Patient Details  Name: Brian Hall MRN: [id number] Date of Birth: September 24, 1955  Today's Date: 09/21/2014 PT Individual Time: 1500-1600 PT Individual Time Calculation (min): 60 min   Short Term Goals: Week 1:  PT Short Term Goal 1 (Week 1): Pt will transfer supine to edge of bed, edgeof bed to supine with min A.  PT Short Term Goal 2 (Week 1): Pt will perform stand pivot bed to chair, chair to bed transfers with min A.  PT Short Term Goal 3 (Week 1): Pt will ambulate with LRAD about 25 feet with mod A.  PT Short Term Goal 4 (Week 1): Pt will ascend/descend 2 stairs with 1 rail and max A.  PT Short Term Goal 5 (Week 1): Pt will propel w/c about 150 feet with S.   Skilled Therapeutic Interventions/Progress Updates:   Pt received sitting in recliner, having just finished with SLP session.  Assisted pt to/from therapy gym in recliner.  Skilled session focused on LUE/LE NMR via tall kneeling and quadruped activities, see full details below and gait training for NMR through [place] as well as facilitation for upright posture, midline orientation and alignment and activation of LLE during swing and stance phase of gait.  Performed two bouts of gait x 100' each time via "three muskateer style" +2A (pt assist approx 30-40%) with L reaction AFO and surgical shoe cover for increased DF assist and L foot clearance.  Provided facilitation on the R for weight shift to the R, upright posture and anterior pelvic tilt.  Therapist on the L providing assist for upright posture, forward motion over LLE during stance phase of gait, L knee stabilization during stance, forward advancement and placement of LLE during swing phase of gait.  Note pt able to mostly clear LLE on his own with adequate weight shift to the R, however pt not able to activate L quad as well today, however feel more likely due to fatigue.  Assisted pt back to recliner and back to room.  Left in recliner with quick  release belt donned and all needs in reach.   Therapy Documentation Precautions:  Precautions Precautions: Fall Precaution Comments: left inattention, liable Restrictions Weight Bearing Restrictions: No   Vital Signs: Therapy Vitals Temp: 97.8 F (36.6 C) Temp Source: Oral Pulse Rate: (!) 112 Resp: 18 BP: (!) 135/97 mmHg Patient Position (if appropriate): Sitting Oxygen Therapy SpO2: 95 % O2 Device: Not Delivered Pain: Pain Assessment Pain Assessment: No/denies pain   Locomotion : Ambulation Ambulation/Gait Assistance: 1: +2 Total assist    Other Treatments: Treatments Therapeutic Activity: Performed tall kneeling and quadruped activities during session while reaching for clothes pins to the R for increased weight shift R with hips vs trunk and then placing them slighty L of midline for increased L weight shift for increased WB through LLE and LUE.  Progressed to quadruped on medium physioball while performing same reaching task.  Facilitation at hips for forward and lateral weight shifts and sustained glute activation with +2 therapist assist at L UE for shouder stability and elbow extension.  Requires mod to max verbal cues for slow controlled movements.   Neuromuscular Facilitation: Left;Upper Extremity;Lower Extremity;Activity to increase motor control;Activity to increase lateral weight shifting;Activity to increase anterior-posterior weight shifting;Forced use;Activity to increase sustained activation Weight Bearing Technique Weight Bearing Technique: Yes RUE Weight Bearing Technique: Quadruped;High kneeling LUE Weight Bearing Technique: Quadruped;High kneeling Response to Weight Bearing Technique: tolerated well  See FIM for current functional status  Therapy/Group: Individual Therapy  Denice Bors 09/21/2014, 6:15 PM

## 2014-09-21 NOTE — Progress Notes (Signed)
Speech Language Pathology Daily Session Note  Patient Details  Name: Brian Hall MRN: [id number] Date of Birth: 1956/06/13  Today's Date: 09/21/2014 SLP Individual Time: 1400-1500 SLP Individual Time Calculation (min): 60 min  Short Term Goals: Week 1: SLP Short Term Goal 1 (Week 1): Pt will improve visual scanning to the left during structured tasks over 75% of observable opportunities with mod assist  SLP Short Term Goal 2 (Week 1): Pt will improve basic, functional problem solving over 75% of observable opportunities with mod assist  SLP Short Term Goal 3 (Week 1): Pt will improve recall of daily information via use of external aids over 75% of observable opportunities with mod assist  SLP Short Term Goal 4 (Week 1): Pt will sustain attention to basic, functional tasks for 3-5 minutes with mod assist cues for redirection.  SLP Short Term Goal 5 (Week 1): Pt will improve speech intelligibility to >80% at the conversational level via min cues for use of dysarthria strategies.    Skilled Therapeutic Interventions:   Skilled therapy session focused on addressing left inattention, maintaining attention, and problem solving. SLP facilitated session by providing Mod A fading to Min A verbal cues, extra time, and asking guiding questions when playing a matching card task. SLP also facilitated session by increasing difficulty of card game as patient needed less time to complete and exhibited more facility with activity. Patient needed Mod A fading to Min A to attend to left visual field throughout session, and needed Min A to identify matching figures in card game. He maintained selective attention in a mildly distracting environment to therapeutic task for about five minute at a time with Min A. Patient initiated discussion of purpose of therapy activity, identifying the purpose as helping his brain get back to where it was, showing awareness of how CVA has affected his cognitive abilities. Patient  was left in lounge chair with PT. Continue with current plan of care.  FIM:  Comprehension Comprehension Mode: Auditory Comprehension: 5-Follows basic conversation/direction: With extra time/assistive device Expression Expression Mode: Verbal Expression: 5-Expresses basic needs/ideas: With extra time/assistive device Social Interaction Social Interaction: 5-Interacts appropriately 90% of the time - Needs monitoring or encouragement for participation or interaction. Problem Solving Problem Solving: 3-Solves basic 50 - 74% of the time/requires cueing 25 - 49% of the time Memory Memory: 4-Recognizes or recalls 75 - 89% of the time/requires cueing 10 - 24% of the time  Pain Pain Assessment Pain Assessment: No/denies pain  Therapy/Group: Individual Therapy  Servando Snare, B.S. 09/21/2014, 4:20 PM

## 2014-09-21 NOTE — Progress Notes (Signed)
Subjective/Complaints: No issues overnight. Patient tolerating therapies. Needs total assist to don TED hose. Appreciate diabetes coordinator no  Review of systems is significant for left-sided weakness.  Objective: Vital Signs: Blood pressure 138/96, pulse 82, temperature 98.3 F (36.8 C), temperature source Oral, resp. rate 19, weight 114.669 kg (252 lb 12.8 oz), SpO2 98 %. No results found. Results for orders placed or performed during the hospital encounter of 09/16/14 (from the past 72 hour(s))  Glucose, capillary     Status: Abnormal   Collection Time: 09/18/14 11:48 AM  Result Value Ref Range   Glucose-Capillary 263 (H) 70 - 99 mg/dL  Glucose, capillary     Status: Abnormal   Collection Time: 09/18/14  4:23 PM  Result Value Ref Range   Glucose-Capillary 221 (H) 70 - 99 mg/dL  Glucose, capillary     Status: Abnormal   Collection Time: 09/18/14  9:21 PM  Result Value Ref Range   Glucose-Capillary 243 (H) 70 - 99 mg/dL  CBC WITH DIFFERENTIAL     Status: None   Collection Time: 09/19/14  6:26 AM  Result Value Ref Range   WBC 7.9 4.0 - 10.5 K/uL   RBC 4.90 4.22 - 5.81 MIL/uL   Hemoglobin 15.4 13.0 - 17.0 g/dL   HCT 44.1 39.0 - 52.0 %   MCV 90.0 78.0 - 100.0 fL   MCH 31.4 26.0 - 34.0 pg   MCHC 34.9 30.0 - 36.0 g/dL   RDW 13.0 11.5 - 15.5 %   Platelets 161 150 - 400 K/uL   Neutrophils Relative % 64 43 - 77 %   Lymphocytes Relative 21 12 - 46 %   Monocytes Relative 11 3 - 12 %   Eosinophils Relative 4 0 - 5 %   Basophils Relative 0 0 - 1 %   Neutro Abs 5.0 1.7 - 7.7 K/uL   Lymphs Abs 1.7 0.7 - 4.0 K/uL   Monocytes Absolute 0.9 0.1 - 1.0 K/uL   Eosinophils Absolute 0.3 0.0 - 0.7 K/uL   Basophils Absolute 0.0 0.0 - 0.1 K/uL   WBC Morphology ATYPICAL LYMPHOCYTES   Comprehensive metabolic panel     Status: Abnormal   Collection Time: 09/19/14  6:26 AM  Result Value Ref Range   Sodium 133 (L) 135 - 145 mmol/L    Comment: Please note change in reference range.   Potassium 4.4 3.5 - 5.1 mmol/L    Comment: Please note change in reference range.   Chloride 107 96 - 112 mEq/L   CO2 20 19 - 32 mmol/L   Glucose, Bld 243 (H) 70 - 99 mg/dL   BUN 42 (H) 6 - 23 mg/dL   Creatinine, Ser 1.49 (H) 0.50 - 1.35 mg/dL   Calcium 9.1 8.4 - 10.5 mg/dL   Total Protein 7.1 6.0 - 8.3 g/dL   Albumin 3.5 3.5 - 5.2 g/dL   AST 29 0 - 37 U/L   ALT 31 0 - 53 U/L   Alkaline Phosphatase 59 39 - 117 U/L   Total Bilirubin 1.1 0.3 - 1.2 mg/dL   GFR calc non Af Amer 51 (L) >90 mL/min   GFR calc Af Amer 59 (L) >90 mL/min    Comment: (NOTE) The eGFR has been calculated using the CKD EPI equation. This calculation has not been validated in all clinical situations. eGFR's persistently <90 mL/min signify possible Chronic Kidney Disease.    Anion gap 6 5 - 15  Glucose, capillary     Status: Abnormal  Collection Time: 09/19/14  7:08 AM  Result Value Ref Range   Glucose-Capillary 208 (H) 70 - 99 mg/dL  Glucose, capillary     Status: Abnormal   Collection Time: 09/19/14 12:04 PM  Result Value Ref Range   Glucose-Capillary 245 (H) 70 - 99 mg/dL  Glucose, capillary     Status: Abnormal   Collection Time: 09/19/14  4:26 PM  Result Value Ref Range   Glucose-Capillary 275 (H) 70 - 99 mg/dL  Glucose, capillary     Status: Abnormal   Collection Time: 09/19/14  8:33 PM  Result Value Ref Range   Glucose-Capillary 222 (H) 70 - 99 mg/dL  Glucose, capillary     Status: Abnormal   Collection Time: 09/20/14  6:26 AM  Result Value Ref Range   Glucose-Capillary 235 (H) 70 - 99 mg/dL  Glucose, capillary     Status: Abnormal   Collection Time: 09/20/14 11:48 AM  Result Value Ref Range   Glucose-Capillary 260 (H) 70 - 99 mg/dL  Glucose, capillary     Status: Abnormal   Collection Time: 09/20/14  4:17 PM  Result Value Ref Range   Glucose-Capillary 202 (H) 70 - 99 mg/dL  Glucose, capillary     Status: Abnormal   Collection Time: 09/20/14  8:49 PM  Result Value Ref Range    Glucose-Capillary 224 (H) 70 - 99 mg/dL  Glucose, capillary     Status: Abnormal   Collection Time: 09/21/14  7:12 AM  Result Value Ref Range   Glucose-Capillary 261 (H) 70 - 99 mg/dL   Comment 1 Notify RN      HEENT: normal Cardio: RRR and no murmurs Resp: CTA B/L and unlabored GI: BS positive and nontender nondistended Extremity:  No Edema Skin:   Intact Neuro: Alert/Oriented, Cranial Nerve Abnormalities left central 7, Abnormal Sensory decreased sensation to light touch left upper and left lower extremity, Abnormal Motor 0/5 left upper extremity, 3 minus/5 in hip knee extensor synergy left lower extremity 0/5 at the ankle and Inattention Musc/Skel:  Other no pain with left shoulder or left lower extremity range of motion Gen.no acute distress Mood and affect are appropriate  Assessment/Plan: 1. Functional deficits secondary to right MCA infarct which require 3+ hours per day of interdisciplinary therapy in a comprehensive inpatient rehab setting. Physiatrist is providing close team supervision and 24 hour management of active medical problems listed below. Physiatrist and rehab team continue to assess barriers to discharge/monitor patient progress toward functional and medical goals. Team conference today please see physician documentation under team conference tab, met with team face-to-face to discuss problems,progress, and goals. Formulized individual treatment plan based on medical history, underlying problem and comorbidities. FIM: FIM - Bathing Bathing Steps Patient Completed: Chest, Abdomen, Front perineal area, Right upper leg, Left upper leg, Left Arm Bathing: 3: Mod-Patient completes 5-7 6f10 parts or 50-74%  FIM - Upper Body Dressing/Undressing Upper body dressing/undressing steps patient completed: Put head through opening of pull over shirt/dress, Thread/unthread right sleeve of pullover shirt/dresss, Thread/unthread left sleeve of pullover shirt/dress Upper body  dressing/undressing: 4: Min-Patient completed 75 plus % of tasks FIM - Lower Body Dressing/Undressing Lower body dressing/undressing steps patient completed: Thread/unthread right pants leg, Don/Doff right shoe Lower body dressing/undressing: 2: Max-Patient completed 25-49% of tasks  FIM - Toileting Toileting: 0: Activity did not occur  FIM - TAir cabin crewTransfers: 0-Activity did not occur  FIM - BControl and instrumentation engineerDevices: Arm rests Bed/Chair Transfer: 3: Bed > Chair  or W/C: Mod A (lift or lower assist), 3: Chair or W/C > Bed: Mod A (lift or lower assist) (w/c <> mat)  FIM - Locomotion: Wheelchair Distance: 60 Locomotion: Wheelchair: 2: Travels 50 - 149 ft with moderate assistance (Pt: 50 - 74%) FIM - Locomotion: Ambulation Locomotion: Ambulation Assistive Devices: Other (comment) ("three muskateer style") Ambulation/Gait Assistance: 1: +2 Total assist Locomotion: Ambulation: 1: Two helpers  Comprehension Comprehension Mode: Auditory Comprehension: 4-Understands basic 75 - 89% of the time/requires cueing 10 - 24% of the time  Expression Expression Mode: Verbal Expression: 5-Expresses basic needs/ideas: With no assist  Social Interaction Social Interaction: 5-Interacts appropriately 90% of the time - Needs monitoring or encouragement for participation or interaction.  Problem Solving Problem Solving: 4-Solves basic 75 - 89% of the time/requires cueing 10 - 24% of the time  Memory Memory: 4-Recognizes or recalls 75 - 89% of the time/requires cueing 10 - 24% of the time  Medical Problem List and Plan:  1. 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 DVT Prophylaxis/Anticoagulation: SQ heparin .Monitor platelet counts and any signs of DVT 3. Pain Management: tylenol as needed 4. Diabetes Mellitus uncontrolledwith peripheral neuropathy.HGB A1C 7.7.SSI. Check blood sugars before meals and at  bedtime. Patient on Glucophage 750 mg twice daily but elevated and Januvia 100 mg daily prior to admission. Add Lantus   10U     5. Neuropsych: This patient is capable of making decisions on his own behalf. 6. Skin/Wound Care: Routine skin checks 7. Fluids/Electrolytes/Nutrition:strict I&O.Follow up labs 8.Hypertension. Lisinopril discontinued 09/16/2014 secondary to mild elevation in creatinine to 1.85..(Patient had also been on Coreg 12.5 mg twice a day prior to admission and lisinopril 40 mg daily) Monitor with increased mobility and resume as needed 9. History throat cancer. Status post dissection with radiation chemotherapy 2011 10.Hypothyroidism.Synthroid 11.Diastolic CHF. Monitor for any signs of fluid overload 12.Hyperlipidemia. Crestor 13.GERD.Protonix  LOS (Days) 5 A FACE TO FACE EVALUATION WAS PERFORMED  KIRSTEINS,ANDREW E 09/21/2014, 9:12 AM

## 2014-09-22 ENCOUNTER — Inpatient Hospital Stay (HOSPITAL_COMMUNITY): Payer: 59 | Admitting: Occupational Therapy

## 2014-09-22 ENCOUNTER — Inpatient Hospital Stay (HOSPITAL_COMMUNITY): Payer: 59 | Admitting: Rehabilitation

## 2014-09-22 ENCOUNTER — Inpatient Hospital Stay (HOSPITAL_COMMUNITY): Payer: 59 | Admitting: *Deleted

## 2014-09-22 LAB — GLUCOSE, CAPILLARY
Glucose-Capillary: 223 mg/dL — ABNORMAL HIGH (ref 70–99)
Glucose-Capillary: 275 mg/dL — ABNORMAL HIGH (ref 70–99)
Glucose-Capillary: 218 mg/dL — ABNORMAL HIGH (ref 70–99)
Glucose-Capillary: 168 mg/dL — ABNORMAL HIGH (ref 70–99)

## 2014-09-22 NOTE — Progress Notes (Signed)
Occupational Therapy Session Note  Patient Details  Name: Brian Hall MRN: [id number] Date of Birth: [birth year]  Today's Date: 09/22/2014 OT Individual Time: 9357-0177 OT Individual Time Calculation (min): 60 min    Short Term Goals: Week 1:  OT Short Term Goal 1 (Week 1): Pt will transfer to BSC/toilet with mod A  OT Short Term Goal 2 (Week 1): Pt will don shirt with mod A with instructional cues OT Short Term Goal 3 (Week 1): Pt will perform sit to stand for clothing mangement with min  A OT Short Term Goal 4 (Week 1): Pt will attend to left visual field durning ADL (80min session) with mod cuing   Skilled Therapeutic Interventions/Progress Updates:  Upon entering the room, pt supine in bed with no c/o pain. Pt performed supine >sit with Mod A for trunk and L LE to EOB. Pt impulsive this morning with squat pivot transfer bed >wheelchair with Mod A. Pt refused shower this morning. He also requested to use STEDY for dynamic standing tasks during bathing and dressing. UB bathing and dressing preformed while seated in wheelchair at sink side with assistance and encouragement to incorporate L UE into functional tasks such as washing and applying deodorant. STEDY utilized for STS in order to wash peri area and clothing management. OT assisting pt with Mod A balance when washing and providing verbal cues for midline orientation and weight shifting towards the L. Therapist utilized Engineer, petroleum in order to provide pt with feedback regarding position in space. Pt seated in wheelchair with QRB and arm tray donned. Call bell and all other needed items within reach upon exiting the room.   Therapy Documentation Precautions:  Precautions Precautions: Fall Precaution Comments: left inattention, liable Restrictions Weight Bearing Restrictions: No ADL: ADL ADL Comments: see FIM  See FIM for current functional status  Therapy/Group: Individual Therapy  [name] 09/22/2014, 11:12  AM

## 2014-09-22 NOTE — Progress Notes (Signed)
Physical Therapy Session Note  Patient Details  Name: COREON SIMKINS MRN: [id number] Date of Birth: 22-Oct-1955  Today's Date: 09/22/2014 PT Co-Treatment Time: 1500 (whole session was 1500-1600 w/ OT)-1530 PT Co-Treatment Time Calculation (min): 30 min  Short Term Goals: Week 1:  PT Short Term Goal 1 (Week 1): Pt will transfer supine to edge of bed, edgeof bed to supine with min A.  PT Short Term Goal 2 (Week 1): Pt will perform stand pivot bed to chair, chair to bed transfers with min A.  PT Short Term Goal 3 (Week 1): Pt will ambulate with LRAD about 25 feet with mod A.  PT Short Term Goal 4 (Week 1): Pt will ascend/descend 2 stairs with 1 rail and max A.  PT Short Term Goal 5 (Week 1): Pt will propel w/c about 150 feet with S.   Skilled Therapeutic Interventions/Progress Updates:   Pt received sitting in w/c in conference room, having just finished SLP group.  Skilled co-treat with OT this session to focus on upright posture, NMR through LUE/LE, weight shifts R and L, and midline orientation with use of LiteGait body support system over treadmill and gait in controlled environment. Pt performed total of 7 mins of gait, 311' at 0.5 mph on treadmill with use of L AFO for DF assist.  PT providing assist for forward advancement of LLE, L knee stability, L quad and glute activation and weight shift forward and over LLE with OT providing assist for upright posture and weight shift to the R for increased L advancement.  Pt continues to want to use RUE to over compensate and pull body, therefore had pt take hand away and only had LUE stabilized on handle.  Then transitioned to having pt advance and retro step LLE to having pt place RLE off of treadmill for WB on RLE while only advancing LLE.  Ended session with gait in hallway x 100' with +2A via "three muskateer style" with same facilitation mentioned above.  He continues to be mostly limited by decreased weight shift to the R and also  overcompensations or apraxia of trunk.  Pt assisted back to room and left in w/c with quick release belt and half lap tray donned.  Wife present to assist with call bell.    Therapy Documentation Precautions:  Precautions Precautions: Fall Precaution Comments: left inattention, liable Restrictions Weight Bearing Restrictions: No   Vital Signs: Therapy Vitals Temp: 97.5 F (36.4 C) Temp Source: Oral Pulse Rate: (!) 117 Resp: 18 BP: (!) 137/96 mmHg Patient Position (if appropriate): Sitting Oxygen Therapy SpO2: 98 % O2 Device: Not Delivered Pain: Pain Assessment Pain Assessment: No/denies pain     See FIM for current functional status  Therapy/Group: Co-Treatment  Cayci Mcnabb, Betha Loa 09/22/2014, 5:03 PM

## 2014-09-22 NOTE — Progress Notes (Signed)
Inpatient Diabetes Program Recommendations  AACE/ADA: New Consensus Statement on Inpatient Glycemic Control (2013)  Target Ranges:  Prepandial:   less than 140 mg/dL      Peak postprandial:   less than 180 mg/dL (1-2 hours)      Critically ill patients:  140 - 180 mg/dL   Reason for Assessment: Results for Brian Hall, Brian Hall (MRN [id number]) as of 09/22/2014 12:21  Ref. Range 09/21/2014 11:48 09/21/2014 16:03 09/21/2014 20:36 09/22/2014 06:37 09/22/2014 11:45  Glucose-Capillary Latest Range: 70-99 mg/dL 308 (H) 182 (H) 287 (H) 223 (H) 275 (H)   Consider increasing Lantus to 15 units daily.    Thanks, Adah Perl, RN, BC-ADM Inpatient Diabetes Coordinator Pager [phone]

## 2014-09-22 NOTE — Progress Notes (Signed)
Occupational Therapy Session Note  Patient Details  Name: Brian Hall MRN: [id number] Date of Birth: [birth year]  Today's Date: 09/22/2014 OT Co-Treatment Time: 1530-1600 ( co treatment time with PT from 1500-1600) OT Co-Treatment Time Calculation (min): 30 min   Short Term Goals: Week 1:  OT Short Term Goal 1 (Week 1): Pt will transfer to BSC/toilet with mod A  OT Short Term Goal 2 (Week 1): Pt will don shirt with mod A with instructional cues OT Short Term Goal 3 (Week 1): Pt will perform sit to stand for clothing mangement with min  A OT Short Term Goal 4 (Week 1): Pt will attend to left visual field durning ADL (15min session) with mod cuing   Skilled Therapeutic Interventions/Progress Updates:  Pt seen for skilled co treatment with PT this session with focus on midline orientation, weight shifts to L and R for foot advancement, posture, and wt bearing through L UE and L LE. Pt transitioned easily from SLP session with no c/o pain. Pt propelled to therapy gym with total A in order to conserve energy.LiteGait utilized over treadmill for a total of 7 minutes of gait, 331' at 0.5 mph with L AFO donned. PT providing assist for forward advancement of LLE, L knee stability and  OT providing assist for upright posture and weight shift to the R for increased L advancement. Pt continues to want to use RUE to over compensate and pull body, therefore had pt take hand away and only had LUE stabilized on handle.Pt then had R LE off treadmill and placed onto even block with treadmill on and L step advancement while R stabilized. Session ended with 100' ambulation with +2 and "three muskateer style" with same facilitation mentioned above. Continues to have decreased weight shift to the R and trunk instability. Pt assisted back to room via wheelchair. Wife present in room with QRB and arm tray donned. Call bell and all needed items within reach.      Therapy Documentation Precautions:   Precautions Precautions: Fall Precaution Comments: left inattention, liable Restrictions Weight Bearing Restrictions: No Pain: Pain Assessment Pain Assessment: No/denies pain ADL: ADL ADL Comments: see FIM  See FIM for current functional status  Therapy/Group: Co-Treatment  Pittman, [name] 09/22/2014, 5:11 PM

## 2014-09-22 NOTE — Progress Notes (Signed)
Speech Language Pathology Daily Session Note  Patient Details  Name: Brian Hall MRN: [id number] Date of Birth: Apr 25, 1956  Today's Date: 09/22/2014 SLP Group Time: 1400-1500 SLP Group Time Calculation (min): 60 min  Short Term Goals: Week 1: SLP Short Term Goal 1 (Week 1): Pt will improve visual scanning to the left during structured tasks over 75% of observable opportunities with mod assist  SLP Short Term Goal 2 (Week 1): Pt will improve basic, functional problem solving over 75% of observable opportunities with mod assist  SLP Short Term Goal 3 (Week 1): Pt will improve recall of daily information via use of external aids over 75% of observable opportunities with mod assist  SLP Short Term Goal 4 (Week 1): Pt will sustain attention to basic, functional tasks for 3-5 minutes with mod assist cues for redirection.  SLP Short Term Goal 5 (Week 1): Pt will improve speech intelligibility to >80% at the conversational level via min cues for use of dysarthria strategies.    Skilled Therapeutic Interventions: Skilled co-treatment group session with Recreational Therapist focused on addressing cognition goals; specifically, awareness of deficits and discharge planning.  Patient interacted with peers with Supervision for turn taking and topic maintenance.  He was able to verbalize intellectual awareness of deficits and was able to anticipate need for wife to help him upon discharge; however, he also stated that riding a motor cycle with modifications was realistic upon discharge.  Overall, patient continues to demonstrate inconsistent emergent and anticipatory awareness of deficits.  Continue with current plan of care.    FIM:  Comprehension Comprehension Mode: Auditory Comprehension: 5-Understands basic 90% of the time/requires cueing < 10% of the time Expression Expression Mode: Verbal Expression: 5-Expresses basic needs/ideas: With no assist Social Interaction Social Interaction:  5-Interacts appropriately 90% of the time - Needs monitoring or encouragement for participation or interaction. Problem Solving Problem Solving: 3-Solves basic 50 - 74% of the time/requires cueing 25 - 49% of the time Memory Memory: 4-Recognizes or recalls 75 - 89% of the time/requires cueing 10 - 24% of the time  Pain Pain Assessment Pain Assessment: No/denies pain  Therapy/Group: Group Therapy  Carmelia Roller., CCC-SLP [phone]  Shyrl Obi 09/22/2014, 4:18 PM

## 2014-09-22 NOTE — Progress Notes (Signed)
Subjective/Complaints: Emotional lability  Review of systems is significant for left-sided weakness.  Objective: Vital Signs: Blood pressure 132/82, pulse 68, temperature 97.8 F (36.6 C), temperature source Oral, resp. rate 17, weight 114.669 kg (252 lb 12.8 oz), SpO2 96 %. No results found. Results for orders placed or performed during the hospital encounter of 09/16/14 (from the past 72 hour(s))  Glucose, capillary     Status: Abnormal   Collection Time: 09/19/14 12:04 PM  Result Value Ref Range   Glucose-Capillary 245 (H) 70 - 99 mg/dL  Glucose, capillary     Status: Abnormal   Collection Time: 09/19/14  4:26 PM  Result Value Ref Range   Glucose-Capillary 275 (H) 70 - 99 mg/dL  Glucose, capillary     Status: Abnormal   Collection Time: 09/19/14  8:33 PM  Result Value Ref Range   Glucose-Capillary 222 (H) 70 - 99 mg/dL  Glucose, capillary     Status: Abnormal   Collection Time: 09/20/14  6:26 AM  Result Value Ref Range   Glucose-Capillary 235 (H) 70 - 99 mg/dL  Glucose, capillary     Status: Abnormal   Collection Time: 09/20/14 11:48 AM  Result Value Ref Range   Glucose-Capillary 260 (H) 70 - 99 mg/dL  Glucose, capillary     Status: Abnormal   Collection Time: 09/20/14  4:17 PM  Result Value Ref Range   Glucose-Capillary 202 (H) 70 - 99 mg/dL  Glucose, capillary     Status: Abnormal   Collection Time: 09/20/14  8:49 PM  Result Value Ref Range   Glucose-Capillary 224 (H) 70 - 99 mg/dL  Glucose, capillary     Status: Abnormal   Collection Time: 09/21/14  7:12 AM  Result Value Ref Range   Glucose-Capillary 261 (H) 70 - 99 mg/dL   Comment 1 Notify RN   Glucose, capillary     Status: Abnormal   Collection Time: 09/21/14 11:48 AM  Result Value Ref Range   Glucose-Capillary 308 (H) 70 - 99 mg/dL  Glucose, capillary     Status: Abnormal   Collection Time: 09/21/14  4:03 PM  Result Value Ref Range   Glucose-Capillary 182 (H) 70 - 99 mg/dL  Glucose, capillary      Status: Abnormal   Collection Time: 09/21/14  8:36 PM  Result Value Ref Range   Glucose-Capillary 287 (H) 70 - 99 mg/dL   Comment 1 Notify RN   Glucose, capillary     Status: Abnormal   Collection Time: 09/22/14  6:37 AM  Result Value Ref Range   Glucose-Capillary 223 (H) 70 - 99 mg/dL   Comment 1 Notify RN      HEENT: normal Cardio: RRR and no murmurs Resp: CTA B/L and unlabored GI: BS positive and nontender nondistended Extremity:  No Edema Skin:   Intact Neuro: Alert/Oriented, Cranial Nerve Abnormalities left central 7, Abnormal Sensory decreased sensation to light touch left upper and left lower extremity, Abnormal Motor 0/5 left upper extremity, 3 minus/5 in hip knee extensor synergy left lower extremity 0/5 at the ankle and Inattention Musc/Skel:  Other no pain with left shoulder or left lower extremity range of motion Gen.no acute distress Mood and affect tearful, labile  Assessment/Plan: 1. Functional deficits secondary to right MCA infarct which require 3+ hours per day of interdisciplinary therapy in a comprehensive inpatient rehab setting. Physiatrist is providing close team supervision and 24 hour management of active medical problems listed below. Physiatrist and rehab team continue to assess barriers to  discharge/monitor patient progress toward functional and medical goals.  FIM: FIM - Bathing Bathing Steps Patient Completed: Chest, Abdomen, Left Arm, Front perineal area, Right upper leg, Left upper leg Bathing: 4: Min-Patient completes 8-9 43f 10 parts or 75+ percent  FIM - Upper Body Dressing/Undressing Upper body dressing/undressing steps patient completed: Thread/unthread right sleeve of pullover shirt/dresss, Put head through opening of pull over shirt/dress, Pull shirt over trunk Upper body dressing/undressing: 4: Min-Patient completed 75 plus % of tasks FIM - Lower Body Dressing/Undressing Lower body dressing/undressing steps patient completed: Thread/unthread  right pants leg, Don/Doff right shoe Lower body dressing/undressing: 2: Max-Patient completed 25-49% of tasks  FIM - Toileting Toileting: 0: Activity did not occur  FIM - Air cabin crew Transfers: 0-Activity did not occur  FIM - Control and instrumentation engineer Devices: Arm rests Bed/Chair Transfer: 3: Bed > Chair or W/C: Mod A (lift or lower assist) (mat to recliner)  FIM - Locomotion: Wheelchair Distance: 60 Locomotion: Wheelchair: 0: Activity did not occur FIM - Locomotion: Ambulation Locomotion: Ambulation Assistive Devices: Other (comment), Orthosis ("three muskateer style" and AFO) Ambulation/Gait Assistance: 1: +2 Total assist Locomotion: Ambulation: 1: Two helpers  Comprehension Comprehension Mode: Auditory Comprehension: 5-Follows basic conversation/direction: With extra time/assistive device  Expression Expression Mode: Verbal Expression: 5-Expresses basic needs/ideas: With extra time/assistive device  Social Interaction Social Interaction: 5-Interacts appropriately 90% of the time - Needs monitoring or encouragement for participation or interaction.  Problem Solving Problem Solving: 3-Solves basic 50 - 74% of the time/requires cueing 25 - 49% of the time  Memory Memory: 4-Recognizes or recalls 75 - 89% of the time/requires cueing 10 - 24% of the time  Medical Problem List and Plan:  1. 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 DVT Prophylaxis/Anticoagulation: SQ heparin .Monitor platelet counts and any signs of DVT 3. Pain Management: tylenol as needed 4. Diabetes Mellitus uncontrolledwith peripheral neuropathy.HGB A1C 7.7.SSI. Check blood sugars before meals and at bedtime. Patient on Glucophage 750 mg twice daily but elevated and Januvia 100 mg daily prior to admission. Add Lantus   10U     5. Neuropsych: This patient is capable of making decisions on his own behalf. 6. Skin/Wound Care: Routine  skin checks 7. Fluids/Electrolytes/Nutrition:strict I&O.Follow up labs 8.Hypertension. Lisinopril discontinued 09/16/2014 secondary to mild elevation in creatinine to 1.85..(Patient had also been on Coreg 12.5 mg twice a day prior to admission and lisinopril 40 mg daily) Monitor with increased mobility and resume as needed 9. History throat cancer. Status post dissection with radiation chemotherapy 2011 10.Hypothyroidism.Synthroid 11.Diastolic CHF. Monitor for any signs of fluid overload 12.Hyperlipidemia. Crestor 13.GERD.Protonix  LOS (Days) 6 A FACE TO FACE EVALUATION WAS PERFORMED  Geneive Sandstrom E 09/22/2014, 9:12 AM

## 2014-09-23 ENCOUNTER — Inpatient Hospital Stay (HOSPITAL_COMMUNITY): Payer: 59 | Admitting: Speech Pathology

## 2014-09-23 ENCOUNTER — Inpatient Hospital Stay (HOSPITAL_COMMUNITY): Payer: 59 | Admitting: Physical Therapy

## 2014-09-23 DIAGNOSIS — R414 Neurologic neglect syndrome: Secondary | ICD-10-CM | POA: Diagnosis present

## 2014-09-23 LAB — GLUCOSE, CAPILLARY
Glucose-Capillary: 214 mg/dL — ABNORMAL HIGH (ref 70–99)
Glucose-Capillary: 233 mg/dL — ABNORMAL HIGH (ref 70–99)
Glucose-Capillary: 220 mg/dL — ABNORMAL HIGH (ref 70–99)
Glucose-Capillary: 182 mg/dL — ABNORMAL HIGH (ref 70–99)

## 2014-09-23 NOTE — Progress Notes (Signed)
Inpatient Diabetes Program Recommendations  AACE/ADA: New Consensus Statement on Inpatient Glycemic Control (2013)  Target Ranges:  Prepandial:   less than 140 mg/dL      Peak postprandial:   less than 180 mg/dL (1-2 hours)      Critically ill patients:  140 - 180 mg/dL   Reason for Assessment: Hyperglycemia  FBS elevated.  Consider increasing Lantus to 15 units QHS.  Will continue to follow. Thank you. Lorenda Peck, RD, LDN, CDE Inpatient Diabetes Coordinator [phone]

## 2014-09-23 NOTE — Progress Notes (Signed)
Physical Therapy Session Note  Patient Details  Name: Brian Hall MRN: [id number] Date of Birth: 05-10-1956  Today's Date: 09/23/2014 PT Individual Time: 1000-1100 PT Individual Time Calculation (min): 60 min   Short Term Goals: Week 1:  PT Short Term Goal 1 (Week 1): Pt will transfer supine to edge of bed, edgeof bed to supine with min A.  PT Short Term Goal 2 (Week 1): Pt will perform stand pivot bed to chair, chair to bed transfers with min A.  PT Short Term Goal 3 (Week 1): Pt will ambulate with LRAD about 25 feet with mod A.  PT Short Term Goal 4 (Week 1): Pt will ascend/descend 2 stairs with 1 rail and max A.  PT Short Term Goal 5 (Week 1): Pt will propel w/c about 150 feet with S.   Skilled Therapeutic Interventions/Progress Updates:   Pt received sitting in wheelchair, agreeable to therapy. Pt performed squat pivot transfer wheelchair > mat to R with mod A and cues for head hips relationship. Pt worked on sit <> stand from edge of mat and weight shifting in standing to right, reaching up and to R to place/remove clothespins from high on "finger ladder" on wall. Pt requires min-max A x1 for dynamic standing and weight shifting and total cues for breathing as pt tends to hold breath during task. Pt performed 3 trials with seated rest in between. Pt performed scooting along edge of mat with min A and cues for anterior weight shift and LLE positioning to facilitate WB. Gait training using L AFO x 35 ft with 3 musketeers technique +2 assist, therapist assisting with positioning LLE, L knee stability in stance, and facilitating weight shift over RLE to advance LLE, total cues for upright posture as patient demonstrates increased forward lean. NuStep using BLE only for LLE NMR, level 3-5 x 10 min with therapist assisting with neutral LLE positioning as he could not correct passive hip abduction, cues for breathing technique. Pt propelled wheelchair back to room using R hemi technique with  supervision and left sitting in room with all needs within reach, family present.   Therapy Documentation Precautions:  Precautions Precautions: Fall Precaution Comments: left inattention, liable Restrictions Weight Bearing Restrictions: No Pain: Pain Assessment Pain Assessment: No/denies pain Pain Score: 6  Pain Type: Chronic pain Pain Location: Back Pain Orientation: Lower Pain Descriptors / Indicators: Nagging Pain Intervention(s): Medication (See eMAR)  See FIM for current functional status  Therapy/Group: Individual Therapy  Laretta Alstrom 09/23/2014, 12:20 PM

## 2014-09-23 NOTE — Progress Notes (Signed)
Speech Language Pathology Weekly Progress and Session Note  Patient Details  Name: Brian Hall MRN: [id number] Date of Birth: 1955-10-19  Beginning of progress report period: September 17, 2014 End of progress report period: September 23, 2014  Today's Date: 09/23/2014 SLP Individual Time: 1400-1500 SLP Individual Time Calculation (min): 60 min  Short Term Goals: Week 1: SLP Short Term Goal 1 (Week 1): Pt will improve visual scanning to the left during structured tasks over 75% of observable opportunities with mod assist  SLP Short Term Goal 1 - Progress (Week 1): Met SLP Short Term Goal 2 (Week 1): Pt will improve basic, functional problem solving over 75% of observable opportunities with mod assist  SLP Short Term Goal 2 - Progress (Week 1): Met SLP Short Term Goal 3 (Week 1): Pt will improve recall of daily information via use of external aids over 75% of observable opportunities with mod assist  SLP Short Term Goal 3 - Progress (Week 1): Met SLP Short Term Goal 4 (Week 1): Pt will sustain attention to basic, functional tasks for 3-5 minutes with mod assist cues for redirection.  SLP Short Term Goal 4 - Progress (Week 1): Met SLP Short Term Goal 5 (Week 1): Pt will improve speech intelligibility to >80% at the conversational level via min cues for use of dysarthria strategies.   SLP Short Term Goal 5 - Progress (Week 1): Met    New Short Term Goals: Week 2: SLP Short Term Goal 1 (Week 2): Patient will attend to left of environment and left upper extremity during functional tasks with Min verbal/question cues.    SLP Short Term Goal 2 (Week 2): Patient will demonsrate moderately complex problem solving during functional tasks with Mod assist. SLP Short Term Goal 3 (Week 2): Patient utilize external aids to assist with recall of daily information wtih Min verbal cues.    SLP Short Term Goal 4 (Week 2): Patient will select attention to functional tasks for 5-10 minutes with Min verbal  cues for redirection.  SLP Short Term Goal 5 (Week 2): Patient will demonstrate appropriate turn taking with peers by self-monitoring interruptions with Supervision assist.  Weekly Progress Updates: Patient has made functional gains and has met 5 out of 5 short term goals this reporting period due to improved cognitive-linguistic abilities with no further need to address dysphagia goals.  Currently, patient continues to require assist for left attention to environment and upper extremity, problem solving with familiar tasks, recall of new information, selective attention and appropriate turn taking with peers during group treatment.  Patient and family education is ongoing and patient would benefit from continued skilled SLP intervention to maximize his functional independence prior to discharge home with 24/7 assist.     Intensity: Minumum of 1-2 x/day, 30 to 90 minutes Frequency: 5 out of 7 days Duration/Length of Stay: 10/14/14 Treatment/Interventions: Cognitive remediation/compensation;Cueing hierarchy;Patient/family education;Functional tasks;Internal/external aids;Environmental controls;Speech/Language facilitation  Daily Session  Skilled Therapeutic Interventions: Skilled treatment session focused on addressing cognition goals. SLP facilitated session with education regarding current medications and Mod assist multimodal cues to utilize external aids for recall of medication function and frequency as well as Min multimodal cues to problem solve task.  Patient also required Min verbal cues to self-monitor and correct errors during task.  Continue plan of care.    FIM:  Comprehension Comprehension Mode: Auditory Comprehension: 5-Understands basic 90% of the time/requires cueing < 10% of the time Expression Expression Mode: Verbal Expression: 5-Expresses complex 90% of the time/cues <  10% of the time Social Interaction Social Interaction: 5-Interacts appropriately 90% of the time - Needs  monitoring or encouragement for participation or interaction. Problem Solving Problem Solving: 4-Solves basic 75 - 89% of the time/requires cueing 10 - 24% of the time Memory Memory: 4-Recognizes or recalls 75 - 89% of the time/requires cueing 10 - 24% of the time General    Pain Pain Assessment Pain Score: 6   Therapy/Group: Individual Therapy  Carmelia Roller., CCC-SLP [phone]  [place] 09/23/2014, 3:50 PM

## 2014-09-23 NOTE — Progress Notes (Signed)
Subjective/Complaints: Patient working on trying to move left arm and left leg  Review of systems is significant for left-sided weakness.  Objective: Vital Signs: Blood pressure 135/85, pulse 61, temperature 97.6 F (36.4 C), temperature source Oral, resp. rate 18, weight 114.669 kg (252 lb 12.8 oz), SpO2 96 %. No results found. Results for orders placed or performed during the hospital encounter of 09/16/14 (from the past 72 hour(s))  Glucose, capillary     Status: Abnormal   Collection Time: 09/20/14 11:48 AM  Result Value Ref Range   Glucose-Capillary 260 (H) 70 - 99 mg/dL  Glucose, capillary     Status: Abnormal   Collection Time: 09/20/14  4:17 PM  Result Value Ref Range   Glucose-Capillary 202 (H) 70 - 99 mg/dL  Glucose, capillary     Status: Abnormal   Collection Time: 09/20/14  8:49 PM  Result Value Ref Range   Glucose-Capillary 224 (H) 70 - 99 mg/dL  Glucose, capillary     Status: Abnormal   Collection Time: 09/21/14  7:12 AM  Result Value Ref Range   Glucose-Capillary 261 (H) 70 - 99 mg/dL   Comment 1 Notify RN   Glucose, capillary     Status: Abnormal   Collection Time: 09/21/14 11:48 AM  Result Value Ref Range   Glucose-Capillary 308 (H) 70 - 99 mg/dL  Glucose, capillary     Status: Abnormal   Collection Time: 09/21/14  4:03 PM  Result Value Ref Range   Glucose-Capillary 182 (H) 70 - 99 mg/dL  Glucose, capillary     Status: Abnormal   Collection Time: 09/21/14  8:36 PM  Result Value Ref Range   Glucose-Capillary 287 (H) 70 - 99 mg/dL   Comment 1 Notify RN   Glucose, capillary     Status: Abnormal   Collection Time: 09/22/14  6:37 AM  Result Value Ref Range   Glucose-Capillary 223 (H) 70 - 99 mg/dL   Comment 1 Notify RN   Glucose, capillary     Status: Abnormal   Collection Time: 09/22/14 11:45 AM  Result Value Ref Range   Glucose-Capillary 275 (H) 70 - 99 mg/dL  Glucose, capillary     Status: Abnormal   Collection Time: 09/22/14  4:15 PM  Result  Value Ref Range   Glucose-Capillary 218 (H) 70 - 99 mg/dL  Glucose, capillary     Status: Abnormal   Collection Time: 09/22/14  8:54 PM  Result Value Ref Range   Glucose-Capillary 168 (H) 70 - 99 mg/dL   Comment 1 Notify RN   Glucose, capillary     Status: Abnormal   Collection Time: 09/23/14  7:35 AM  Result Value Ref Range   Glucose-Capillary 214 (H) 70 - 99 mg/dL     HEENT: normal Cardio: RRR and no murmurs Resp: CTA B/L and unlabored GI: BS positive and nontender nondistended Extremity:  No Edema Skin:   Intact Neuro: Alert/Oriented, Cranial Nerve Abnormalities left central 7, Abnormal Sensory decreased sensation to light touch left upper and left lower extremity, Abnormal Motor 0/5 left upper extremity, 3 minus/5 in hip knee extensor synergy left lower extremity 0/5 at the ankle and Inattention Musc/Skel:  Other no pain with left shoulder or left lower extremity range of motion Gen.no acute distress Mood and affect tearful, labile  Assessment/Plan: 1. Functional deficits secondary to right MCA infarct which require 3+ hours per day of interdisciplinary therapy in a comprehensive inpatient rehab setting. Physiatrist is providing close team supervision and 24 hour  management of active medical problems listed below. Physiatrist and rehab team continue to assess barriers to discharge/monitor patient progress toward functional and medical goals.  FIM: FIM - Bathing Bathing Steps Patient Completed: Chest, Left Arm, Abdomen, Front perineal area, Right upper leg, Left upper leg Bathing: 3: Mod-Patient completes 5-7 54f 10 parts or 50-74%  FIM - Upper Body Dressing/Undressing Upper body dressing/undressing steps patient completed: Thread/unthread right sleeve of pullover shirt/dresss, Thread/unthread left sleeve of pullover shirt/dress, Put head through opening of pull over shirt/dress, Pull shirt over trunk Upper body dressing/undressing: 5: Set-up assist to: Obtain clothing/put  away FIM - Lower Body Dressing/Undressing Lower body dressing/undressing steps patient completed: Thread/unthread right underwear leg, Thread/unthread right pants leg, Don/Doff right shoe Lower body dressing/undressing: 2: Max-Patient completed 25-49% of tasks  FIM - Toileting Toileting: 0: Activity did not occur  FIM - Air cabin crew Transfers: 0-Activity did not occur  FIM - Control and instrumentation engineer Devices: Arm rests Bed/Chair Transfer: 3: Bed > Chair or W/C: Mod A (lift or lower assist)  FIM - Locomotion: Wheelchair Distance: 60 Locomotion: Wheelchair: 0: Activity did not occur FIM - Locomotion: Ambulation Locomotion: Ambulation Assistive Devices: Other (comment), Orthosis ("three muskateer style" and AFO) Ambulation/Gait Assistance: 1: +2 Total assist Locomotion: Ambulation: 1: Two helpers  Comprehension Comprehension Mode: Auditory Comprehension: 5-Understands basic 90% of the time/requires cueing < 10% of the time  Expression Expression Mode: Verbal Expression: 5-Expresses basic needs/ideas: With no assist  Social Interaction Social Interaction: 5-Interacts appropriately 90% of the time - Needs monitoring or encouragement for participation or interaction.  Problem Solving Problem Solving: 3-Solves basic 50 - 74% of the time/requires cueing 25 - 49% of the time  Memory Memory: 4-Recognizes or recalls 75 - 89% of the time/requires cueing 10 - 24% of the time  Medical Problem List and Plan:  1. 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 DVT Prophylaxis/Anticoagulation: SQ heparin .Monitor platelet counts and any signs of DVT 3. Pain Management: tylenol as needed 4. Diabetes Mellitus uncontrolledwith peripheral neuropathy.HGB A1C 7.7.SSI. Check blood sugars before meals and at bedtime. Patient on Glucophage 750 mg twice daily but elevated and Januvia 100 mg daily prior to admission. Increase Lantus    15U     5. Neuropsych: This patient is capable of making decisions on his own behalf. 6. Skin/Wound Care: Routine skin checks 7. Fluids/Electrolytes/Nutrition:strict I&O.Follow up labs 8.Hypertension. Lisinopril discontinued 09/16/2014 secondary to mild elevation in creatinine to 1.85..(Patient had also been on Coreg 12.5 mg twice a day prior to admission and lisinopril 40 mg daily) Monitor with increased mobility and resume as needed 9. History throat cancer. Status post dissection with radiation chemotherapy 2011 10.Hypothyroidism.Synthroid 11.Diastolic CHF. Monitor for any signs of fluid overload 12.Hyperlipidemia. Crestor 13.GERD.Protonix  LOS (Days) 7 A FACE TO FACE EVALUATION WAS PERFORMED  KIRSTEINS,ANDREW E 09/23/2014, 8:03 AM

## 2014-09-23 NOTE — Progress Notes (Signed)
Occupational Therapy Weekly Progress Note  Patient Details  Name: Brian Hall MRN: [id number] Date of Birth: 1956-05-24  Beginning of progress report period: September 17, 2014 End of progress report period: September 23, 2013  Today's Date: 09/23/2014 OT Individual Time: 0900-1000 OT Individual Time Calculation (min): 60 min    Patient has met 2 of 4 short term goals. Pt making steady progress towards goals this week. Pt currently requiring total A for toileting and toilet transfer secondary to +2 or STEDY utilized for safety.Pt currently requiring Max A for STS for clothing management at this time. Pt making significant progress with UB dressing and attending to L with min verbal cues during session. Pt continues to progress towards goals.  Patient continues to demonstrate the following deficits: decreased L attention, decreased safety awareness, impulsive,  Decreased strength and AROM in L UE/LE, decreased seated and standing balance, decreased functional mobility, decreased functional balance, decreased sensation, and decreased trunk control and therefore will continue to benefit from skilled OT intervention to enhance overall performance with BADL.  Patient progressing toward long term goals..  Continue plan of care.  OT Short Term Goals Week 2:  OT Short Term Goal 1 (Week 2): Pt will perform shower transfer with Max A in order to decrease level of assistance for functional transfer. OT Short Term Goal 2 (Week 2): Pt will demonstrate purse lip breathing during ADL session in order to decrease SOB with functional activity. OT Short Term Goal 3 (Week 2): Pt will perform toilet transfer with Mod A in order to decrease level of assist for functional transfers. OT Short Term Goal 4 (Week 2): Pt will perform LB dressing with Mod A utilizing hemiplegic dressing techniques as needed.  Skilled Therapeutic Interventions/Progress Updates:  Upon entering the room, pt supine in bed with no c/o  pain. Wife present in room during session for observation. Pt agreeable to bathing at shower level this session. Skilled OT session with focus on self care retraining, STS, functional transfer, and pt /family education. Pt performed supine >sit with Min A for trunk to sit on EOB. Pt performed squat pivot transfer from bed > wheelchair with Mod A. However, pt required Mod verbal cues for safety awareness as pt wanting to complete transfer fast and "throwing" self around. OT educated pt on pursed lip breathing as he hold breath during activity causing him to appear SOB. Pt able to return demonstration but difficulty implementing into session. Education to continue. Pt standing with therapist with Max A for therapist to assist with clothing management in order to get into the shower.  STEDY utilized for shower transfer for safety. Pt requiring verbal cues for safety with L UE during STS from STEDY. Bathing completed with assist from therapist to incorporate L UE in bathing tasks this session. Pt sat in STEDY in front of mirror during clothing management for feed back regarding midline orientation and clothing management. Pt seated in wheelchair with arm tray donned and call bell within reach. Wife remains in room with patient.   Therapy Documentation Precautions:  Precautions Precautions: Fall Precaution Comments: left inattention, liable Restrictions Weight Bearing Restrictions: No Pain: Pain Assessment Pain Assessment: No/denies pain ADL: ADL ADL Comments: see FIM  See FIM for current functional status  Therapy/Group: Individual Therapy  [name] 09/23/2014, 10:55 AM

## 2014-09-24 ENCOUNTER — Inpatient Hospital Stay (HOSPITAL_COMMUNITY): Payer: 59 | Admitting: Occupational Therapy

## 2014-09-24 ENCOUNTER — Inpatient Hospital Stay (HOSPITAL_COMMUNITY): Payer: 59 | Admitting: Physical Therapy

## 2014-09-24 LAB — GLUCOSE, CAPILLARY
GLUCOSE-CAPILLARY: 192 mg/dL — AB (ref 70–99)
Glucose-Capillary: 186 mg/dL — ABNORMAL HIGH (ref 70–99)
Glucose-Capillary: 223 mg/dL — ABNORMAL HIGH (ref 70–99)
Glucose-Capillary: 149 mg/dL — ABNORMAL HIGH (ref 70–99)

## 2014-09-24 MED ORDER — GLIMEPIRIDE 2 MG PO TABS
2.0000 mg | ORAL_TABLET | Freq: Every day | ORAL | Status: DC
  Administered 2014-09-24 – 2014-10-14 (×21): 2 mg via ORAL
  Filled 2014-09-24 (×22): qty 1

## 2014-09-24 NOTE — Progress Notes (Signed)
Occupational Therapy Session Note  Patient Details  Name: Brian Hall MRN: [id number] Date of Birth: September 06, 1955  Today's Date: 09/24/2014 OT Individual Time: 0488-8916 OT Individual Time Calculation (min): 60 min    Short Term Goals: Week 2:  OT Short Term Goal 1 (Week 2): Pt will perform shower transfer with Max A in order to decrease level of assistance for functional transfer. OT Short Term Goal 2 (Week 2): Pt will demonstrate purse lip breathing during ADL session in order to decrease SOB with functional activity. OT Short Term Goal 3 (Week 2): Pt will perform toilet transfer with Mod A in order to decrease level of assist for functional transfers. OT Short Term Goal 4 (Week 2): Pt will perform LB dressing with Mod A utilizing hemiplegic dressing techniques as needed.  Skilled Therapeutic Interventions/Progress Updates:   Patient seen for ADL retraining and instruction this morning for improved ADL self care skills, standing balance, standing tolerance, functional mobility, activity tolerance, ROM and functional use of LUE.  Patient supine in bed upon therapist arrival.  Patient's HOB placed in flat position and patient instructed in using RUE to push body toward L side for improved space for bed mobility rolling.  Transfers supine to sidelying with supervision and sidelying to EOB with minimum assistance for LLE management and verbal cues for attending to LUE during transfer.  Patient transfers EOB to wheelchair toward L side via lateral scoot/squat pivot moderate assistance and max verbal cues for technique.  Wife present and observes transfer and ADL session.  Patient seated at sink in wheelchair and completes grooming task with verbal cues only for technique for teethbrushing.  Patient transfers wheelchair to transfer tub bench via STEADY with total assistance for safety.  Patient with moderate assistance sit to/from stand to/from STEADY during transfers.  Patient instructed in  hemibathing techniques for washing RUE with washcloth on R upper leg and cradling LUE in RUE for washing R upper arm/shoulder.  Patient completes bathing with moderate assistance to wash buttocks and B feet.  Patient instructed in side to side leaning to wash buttocks and was able to assist with washing R side of buttocks but requires assistance for L side.  Patient completes dressing wheelchair level the the sink.  Patient completes UB dressing with verbal cues for hemidressing techniques and uses mirror for visual feedback.  Patient completes LB dressing with maximum assistance.  Instructed in hemidressing techniques for improved ROM/reach to L foot for threading LLE through pants.  Patient unable to cross legs over opposite knees.  Patient completes sit to/from stand with use of STEADY for safety with moderate assistance and attempts to assist with clothing management up over R hip.  Patient dependent for knee length TED hose; educated patient and wife regarding purpose and donning of TED hose.  Further reinforcement recommended.  Patient completes donning of R shoe and requires assist for L shoe.  Patient upright in wheelchair with lap tray and leg rest on LUE/LLE.  Wife in room.  Needs in place.  Patient with excellent participation this morning.  Patient motivated with rehab potential remaining good.   Therapy Documentation Precautions:  Precautions Precautions: Fall Precaution Comments: left inattention, liable Restrictions Weight Bearing Restrictions: No Pain:  Denies ADL: ADL ADL Comments: see FIM  See FIM for current functional status  Therapy/Group: Individual Therapy  Osa Craver 09/24/2014, 9:16 AM

## 2014-09-24 NOTE — Progress Notes (Signed)
Subjective/Complaints: Up in bed. No complaints. Progressing.  Review of systems is significant for left-sided weakness.  Objective: Vital Signs: Blood pressure 142/102, pulse 74, temperature 97.7 F (36.5 C), temperature source Oral, resp. rate 18, weight 114.669 kg (252 lb 12.8 oz), SpO2 96 %. No results found. Results for orders placed or performed during the hospital encounter of 09/16/14 (from the past 72 hour(s))  Glucose, capillary     Status: Abnormal   Collection Time: 09/21/14 11:48 AM  Result Value Ref Range   Glucose-Capillary 308 (H) 70 - 99 mg/dL  Glucose, capillary     Status: Abnormal   Collection Time: 09/21/14  4:03 PM  Result Value Ref Range   Glucose-Capillary 182 (H) 70 - 99 mg/dL  Glucose, capillary     Status: Abnormal   Collection Time: 09/21/14  8:36 PM  Result Value Ref Range   Glucose-Capillary 287 (H) 70 - 99 mg/dL   Comment 1 Notify RN   Glucose, capillary     Status: Abnormal   Collection Time: 09/22/14  6:37 AM  Result Value Ref Range   Glucose-Capillary 223 (H) 70 - 99 mg/dL   Comment 1 Notify RN   Glucose, capillary     Status: Abnormal   Collection Time: 09/22/14 11:45 AM  Result Value Ref Range   Glucose-Capillary 275 (H) 70 - 99 mg/dL  Glucose, capillary     Status: Abnormal   Collection Time: 09/22/14  4:15 PM  Result Value Ref Range   Glucose-Capillary 218 (H) 70 - 99 mg/dL  Glucose, capillary     Status: Abnormal   Collection Time: 09/22/14  8:54 PM  Result Value Ref Range   Glucose-Capillary 168 (H) 70 - 99 mg/dL   Comment 1 Notify RN   Glucose, capillary     Status: Abnormal   Collection Time: 09/23/14  7:35 AM  Result Value Ref Range   Glucose-Capillary 214 (H) 70 - 99 mg/dL  Glucose, capillary     Status: Abnormal   Collection Time: 09/23/14 11:39 AM  Result Value Ref Range   Glucose-Capillary 233 (H) 70 - 99 mg/dL  Glucose, capillary     Status: Abnormal   Collection Time: 09/23/14  4:58 PM  Result Value Ref Range   Glucose-Capillary 220 (H) 70 - 99 mg/dL  Glucose, capillary     Status: Abnormal   Collection Time: 09/23/14  8:39 PM  Result Value Ref Range   Glucose-Capillary 182 (H) 70 - 99 mg/dL   Comment 1 Notify RN   Glucose, capillary     Status: Abnormal   Collection Time: 09/24/14  6:26 AM  Result Value Ref Range   Glucose-Capillary 186 (H) 70 - 99 mg/dL   Comment 1 Notify RN   Glucose, capillary     Status: Abnormal   Collection Time: 09/24/14 11:22 AM  Result Value Ref Range   Glucose-Capillary 223 (H) 70 - 99 mg/dL     HEENT: normal Cardio: RRR and no murmurs Resp: CTA B/L and unlabored GI: BS positive and nontender nondistended Extremity:  No Edema Skin:   Intact Neuro: Alert/Oriented, Cranial Nerve Abnormalities left central 7, Abnormal Sensory decreased sensation to light touch left upper and left lower extremity, Abnormal Motor 0/5 left upper extremity, 3 minus/5 in hip knee extensor synergy left lower extremity 0/5 at the ankle and Inattention Musc/Skel:  Other no pain with left shoulder or left lower extremity range of motion Gen.no acute distress Mood and affect tearful, labile  Assessment/Plan: 1.  Functional deficits secondary to right MCA infarct which require 3+ hours per day of interdisciplinary therapy in a comprehensive inpatient rehab setting. Physiatrist is providing close team supervision and 24 hour management of active medical problems listed below. Physiatrist and rehab team continue to assess barriers to discharge/monitor patient progress toward functional and medical goals.  FIM: FIM - Bathing Bathing Steps Patient Completed: Chest, Left Arm, Abdomen, Front perineal area, Right upper leg, Left upper leg Bathing: 3: Mod-Patient completes 5-7 54f 10 parts or 50-74%  FIM - Upper Body Dressing/Undressing Upper body dressing/undressing steps patient completed: Thread/unthread right sleeve of pullover shirt/dresss, Thread/unthread left sleeve of pullover shirt/dress,  Put head through opening of pull over shirt/dress, Pull shirt over trunk Upper body dressing/undressing: 5: Set-up assist to: Obtain clothing/put away FIM - Lower Body Dressing/Undressing Lower body dressing/undressing steps patient completed: Thread/unthread right underwear leg, Thread/unthread right pants leg, Don/Doff right shoe Lower body dressing/undressing: 2: Max-Patient completed 25-49% of tasks  FIM - Toileting Toileting: 0: Activity did not occur  FIM - Air cabin crew Transfers: 0-Activity did not occur  FIM - Control and instrumentation engineer Devices: Arm rests Bed/Chair Transfer: 3: Bed > Chair or W/C: Mod A (lift or lower assist), 3: Chair or W/C > Bed: Mod A (lift or lower assist)  FIM - Locomotion: Wheelchair Distance: 150 Locomotion: Wheelchair: 5: Travels 150 ft or more: maneuvers on rugs and over door sills with supervision, cueing or coaxing FIM - Locomotion: Ambulation Locomotion: Ambulation Assistive Devices: Other (comment), Orthosis (3 musketeers) Ambulation/Gait Assistance: 1: +2 Total assist Locomotion: Ambulation: 1: Two helpers  Comprehension Comprehension Mode: Auditory Comprehension: 5-Understands complex 90% of the time/Cues < 10% of the time  Expression Expression Mode: Verbal Expression: 5-Expresses complex 90% of the time/cues < 10% of the time  Social Interaction Social Interaction: 6-Interacts appropriately with others with medication or extra time (anti-anxiety, antidepressant).  Problem Solving Problem Solving: 5-Solves basic problems: With no assist  Memory Memory: 6-More than reasonable amt of time  Medical Problem List and Plan:  1. 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 DVT Prophylaxis/Anticoagulation: SQ heparin .Monitor platelet counts and any signs of DVT 3. Pain Management: tylenol as needed 4. Diabetes Mellitus uncontrolledwith peripheral neuropathy.HGB A1C  7.7.SSI. Check blood sugars before meals and at bedtime. Patient on Glucophage 750 mg twice daily but elevated and Januvia 100 mg daily prior to admission.   Lantus   10U   -add amaryl 2mg  daily     5. Neuropsych: This patient is capable of making decisions on his own behalf. 6. Skin/Wound Care: Routine skin checks 7. Fluids/Electrolytes/Nutrition:strict I&O.Follow up labs 8.Hypertension. Lisinopril discontinued 09/16/2014 secondary to mild elevation in creatinine to 1.85..(Patient had also been on Coreg 12.5 mg twice a day prior to admission and lisinopril 40 mg daily) Monitor with increased mobility and resume as needed 9. History throat cancer. Status post dissection with radiation chemotherapy 2011 10.Hypothyroidism.Synthroid 11.Diastolic CHF. Monitor for any signs of fluid overload 12.Hyperlipidemia. Crestor 13.GERD.Protonix  LOS (Days) 8 A FACE TO FACE EVALUATION WAS PERFORMED  SWARTZ,ZACHARY T 09/24/2014, 11:29 AM

## 2014-09-25 ENCOUNTER — Inpatient Hospital Stay (HOSPITAL_COMMUNITY): Payer: 59 | Admitting: *Deleted

## 2014-09-25 LAB — GLUCOSE, CAPILLARY
GLUCOSE-CAPILLARY: 119 mg/dL — AB (ref 70–99)
Glucose-Capillary: 166 mg/dL — ABNORMAL HIGH (ref 70–99)
Glucose-Capillary: 179 mg/dL — ABNORMAL HIGH (ref 70–99)
Glucose-Capillary: 157 mg/dL — ABNORMAL HIGH (ref 70–99)

## 2014-09-25 NOTE — Progress Notes (Signed)
Physical Therapy Session Note  Patient Details  Name: [name] MRN: [id number] Date of Birth: 07-20-1956  Today's Date: 09/25/2014 PT Individual Time: 0755-0855 PT Individual Time Calculation (min): 60 min    Skilled Therapeutic Interventions/Progress Updates:  Session I [phone]( 60 min ) Patient in bed at the beginning of the session agrees to therapy intervention,assistance with donning TED hose. Supine to sit with min A due to balance loss at assuming position. Transfer training to w/c x5 bed to w/c -scooting with SBA and ModA for stand pivot. Training of w/c propulsion to the gym and back with min A. NuStep x7 min with assistance of therapist to reduce hip abduction.  In II bars sit to stand and marching in place with max A to reduce forward lean and maxA to facilitate L foot lift. Attempted Harmon Pier walker ,with one person assist it is very difficult and unsafe. Training in taking steps with Hemi walker and max A, stop after each step to increase proper posture and reduce lean. Patient returned to room with wife present.  Session II 1400-1500 (60 min) Patient ready to participate in therapy,session has begun with W/C propulsion using only LE forward and backward to increase participation and increase strength in B LE. Gait Training with Hemi walker, w/c follow, max A and manual facilitation of LLE and knee lock, max VC for weight shifting and to reduce forward lean.  NMR in sitting to L UE adn L LE,exercises to increase ROM and active movement.  Core strengthening in sitting and in supine.  NMR in standing to increase posture with visual feedback (mirror). Patient returned to room, left with all needs within reach and wife present. No complains of pain or discomfort.  Therapy Documentation Precautions:  Precautions Precautions: Fall Precaution Comments: left inattention, liable Restrictions Weight Bearing Restrictions: No Pain: Pain Assessment Pain Score: 5   See FIM  for current functional status  Therapy/Group: Individual Therapy  Guadlupe Spanish 09/25/2014, 12:35 PM

## 2014-09-25 NOTE — Progress Notes (Signed)
Subjective/Complaints: Denies pain. Therapy progressing. No problems overnight. Review of systems is significant for left-sided weakness.  Objective: Vital Signs: Blood pressure 154/87, pulse 61, temperature 94.5 F (34.7 C), temperature source Oral, resp. rate 18, weight 114.669 kg (252 lb 12.8 oz), SpO2 97 %. No results found. Results for orders placed or performed during the hospital encounter of 09/16/14 (from the past 72 hour(s))  Glucose, capillary     Status: Abnormal   Collection Time: 09/22/14 11:45 AM  Result Value Ref Range   Glucose-Capillary 275 (H) 70 - 99 mg/dL  Glucose, capillary     Status: Abnormal   Collection Time: 09/22/14  4:15 PM  Result Value Ref Range   Glucose-Capillary 218 (H) 70 - 99 mg/dL  Glucose, capillary     Status: Abnormal   Collection Time: 09/22/14  8:54 PM  Result Value Ref Range   Glucose-Capillary 168 (H) 70 - 99 mg/dL   Comment 1 Notify RN   Glucose, capillary     Status: Abnormal   Collection Time: 09/23/14  7:35 AM  Result Value Ref Range   Glucose-Capillary 214 (H) 70 - 99 mg/dL  Glucose, capillary     Status: Abnormal   Collection Time: 09/23/14 11:39 AM  Result Value Ref Range   Glucose-Capillary 233 (H) 70 - 99 mg/dL  Glucose, capillary     Status: Abnormal   Collection Time: 09/23/14  4:58 PM  Result Value Ref Range   Glucose-Capillary 220 (H) 70 - 99 mg/dL  Glucose, capillary     Status: Abnormal   Collection Time: 09/23/14  8:39 PM  Result Value Ref Range   Glucose-Capillary 182 (H) 70 - 99 mg/dL   Comment 1 Notify RN   Glucose, capillary     Status: Abnormal   Collection Time: 09/24/14  6:26 AM  Result Value Ref Range   Glucose-Capillary 186 (H) 70 - 99 mg/dL   Comment 1 Notify RN   Glucose, capillary     Status: Abnormal   Collection Time: 09/24/14 11:22 AM  Result Value Ref Range   Glucose-Capillary 223 (H) 70 - 99 mg/dL  Glucose, capillary     Status: Abnormal   Collection Time: 09/24/14  4:30 PM  Result  Value Ref Range   Glucose-Capillary 192 (H) 70 - 99 mg/dL   Comment 1 Notify RN   Glucose, capillary     Status: Abnormal   Collection Time: 09/24/14  9:00 PM  Result Value Ref Range   Glucose-Capillary 149 (H) 70 - 99 mg/dL   Comment 1 Notify RN   Glucose, capillary     Status: Abnormal   Collection Time: 09/25/14  7:03 AM  Result Value Ref Range   Glucose-Capillary 166 (H) 70 - 99 mg/dL   Comment 1 Notify RN      HEENT: normal Cardio: RRR and no murmurs Resp: CTA B/L and unlabored GI: BS positive and nontender nondistended Extremity:  No Edema Skin:   Intact Neuro: Alert/Oriented, Cranial Nerve Abnormalities left central 7, Abnormal Sensory decreased sensation to light touch left upper and left lower extremity, Abnormal Motor 0 to trace /5 left upper extremity, 3 minus/5 in hip knee extensor synergy left lower extremity 0/5 at the ankle and Inattention Musc/Skel:  Other no pain with left shoulder or left lower extremity range of motion Gen.no acute distress Mood and affect tearful, labile  Assessment/Plan: 1. Functional deficits secondary to right MCA infarct which require 3+ hours per day of interdisciplinary therapy in a comprehensive  inpatient rehab setting. Physiatrist is providing close team supervision and 24 hour management of active medical problems listed below. Physiatrist and rehab team continue to assess barriers to discharge/monitor patient progress toward functional and medical goals.  FIM: FIM - Bathing Bathing Steps Patient Completed: Chest, Left Arm, Abdomen, Front perineal area, Right upper leg, Left upper leg Bathing: 3: Mod-Patient completes 5-7 41f 10 parts or 50-74%  FIM - Upper Body Dressing/Undressing Upper body dressing/undressing steps patient completed: Thread/unthread right sleeve of pullover shirt/dresss, Thread/unthread left sleeve of pullover shirt/dress, Put head through opening of pull over shirt/dress, Pull shirt over trunk Upper body  dressing/undressing: 5: Set-up assist to: Obtain clothing/put away FIM - Lower Body Dressing/Undressing Lower body dressing/undressing steps patient completed: Thread/unthread right underwear leg, Thread/unthread right pants leg, Don/Doff right shoe Lower body dressing/undressing: 2: Max-Patient completed 25-49% of tasks  FIM - Toileting Toileting: 0: Activity did not occur  FIM - Air cabin crew Transfers: 0-Activity did not occur  FIM - Control and instrumentation engineer Devices: Arm rests Bed/Chair Transfer: 3: Bed > Chair or W/C: Mod A (lift or lower assist), 3: Chair or W/C > Bed: Mod A (lift or lower assist)  FIM - Locomotion: Wheelchair Distance: 150 Locomotion: Wheelchair: 5: Travels 150 ft or more: maneuvers on rugs and over door sills with supervision, cueing or coaxing FIM - Locomotion: Ambulation Locomotion: Ambulation Assistive Devices: Other (comment), Orthosis (3 musketeers) Ambulation/Gait Assistance: 1: +2 Total assist Locomotion: Ambulation: 1: Two helpers  Comprehension Comprehension Mode: Auditory Comprehension: 5-Understands complex 90% of the time/Cues < 10% of the time  Expression Expression Mode: Verbal Expression: 5-Expresses complex 90% of the time/cues < 10% of the time  Social Interaction Social Interaction: 6-Interacts appropriately with others with medication or extra time (anti-anxiety, antidepressant).  Problem Solving Problem Solving: 5-Solves basic problems: With no assist  Memory Memory: 6-More than reasonable amt of time  Medical Problem List and Plan:  1. 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 DVT Prophylaxis/Anticoagulation: SQ heparin .Monitor platelet counts and any signs of DVT 3. Pain Management: tylenol as needed 4. Diabetes Mellitus uncontrolledwith peripheral neuropathy.HGB A1C 7.7.SSI. Check blood sugars before meals and at bedtime. Patient on Glucophage 750 mg  twice daily but elevated and Januvia 100 mg daily prior to admission.   Lantus   10U   -added amaryl 2mg  daily  -some improvement in CBG's    5. Neuropsych: This patient is capable of making decisions on his own behalf. 6. Skin/Wound Care: Routine skin checks 7. Fluids/Electrolytes/Nutrition:strict I&O.Follow up labs 8.Hypertension. Lisinopril discontinued 09/16/2014 secondary to mild elevation in creatinine to 1.85..(Patient had also been on Coreg 12.5 mg twice a day prior to admission and lisinopril 40 mg daily) Monitor with increased mobility and resume as needed 9. History throat cancer. Status post dissection with radiation chemotherapy 2011 10.Hypothyroidism.Synthroid 11.Diastolic CHF. Monitor for any signs of fluid overload 12.Hyperlipidemia. Crestor 13.GERD.Protonix  LOS (Days) 9 A FACE TO FACE EVALUATION WAS PERFORMED  [name] T 09/25/2014, 8:46 AM

## 2014-09-26 ENCOUNTER — Inpatient Hospital Stay (HOSPITAL_COMMUNITY): Payer: 59 | Admitting: Speech Pathology

## 2014-09-26 ENCOUNTER — Inpatient Hospital Stay (HOSPITAL_COMMUNITY): Payer: 59

## 2014-09-26 ENCOUNTER — Inpatient Hospital Stay (HOSPITAL_COMMUNITY): Payer: 59 | Admitting: *Deleted

## 2014-09-26 LAB — GLUCOSE, CAPILLARY
GLUCOSE-CAPILLARY: 143 mg/dL — AB (ref 70–99)
GLUCOSE-CAPILLARY: 126 mg/dL — AB (ref 70–99)
GLUCOSE-CAPILLARY: 231 mg/dL — AB (ref 70–99)
Glucose-Capillary: 176 mg/dL — ABNORMAL HIGH (ref 70–99)

## 2014-09-26 NOTE — Progress Notes (Deleted)
Physical Therapy Session Note  Patient Details  Name: [name] MRN: [id number] Date of Birth: October 23, 1955  Today's Date: 09/26/2014 PT Individual Time: 1000-1100 PT Individual Time Calculation (min): 60 min   Short Term Goals: Week 2:     Skilled Therapeutic Interventions/Progress Updates:  neuromuscular re-education via forced use, VCs, manual cues for activities below  Gait using R railing in hallway, L AFO, x 20' with mod/max assist for L foot placement, L knee stability, wt shifting    Therapy Documentation Precautions:  Precautions Precautions: Fall Precaution Comments: left inattention, labile Restrictions Weight Bearing Restrictions: No General:   Vital Signs: Therapy Vitals Temp: 98.9 F (37.2 C) Temp Source: Oral Pulse Rate: (!) 47 Resp: 18 BP: 135/87 mmHg Patient Position (if appropriate): Sitting Pain: Pain Assessment Pain Assessment: No/denies pain Mobility:   Locomotion :    Trunk/Postural Assessment :    Balance:   Exercises:   Other Treatments:    See FIM for current functional status  Therapy/Group: Individual Therapy  Nahuel Wilbert 09/26/2014, 5:10 PM

## 2014-09-26 NOTE — Progress Notes (Signed)
Physical Therapy Weekly Progress Note  Patient Details  Name: Brian Hall MRN: [id number] Date of Birth: [birth year]  Beginning of progress report period: 09/17/14  End of progress report period:09/26/14  Today's Date: 09/27/2014 PT Individual Time:1000  - 1100, 60 min    Patient has met 1 of 5 short term goals, and partly met 2/5 goals.    Patient continues to demonstrate the following deficits: balance, strength and motor control, L inattention,  and therefore will continue to benefit from skilled PT intervention to enhance overall performance with activity tolerance, balance, postural control, ability to compensate for deficits, functional use of  left upper extremity and left lower extremity and awareness.  Patient progressing toward long term goals..  Continue plan of care.  PT Short Term Goals Week 1:  PT Short Term Goal 1 (Week 1): Pt will transfer supine to edge of bed, edgeof bed to supine with min A.  PT Short Term Goal 1 - Progress (Week 1): Partly met PT Short Term Goal 2 (Week 1): Pt will perform stand pivot bed to chair, chair to bed transfers with min A.  PT Short Term Goal 2 - Progress (Week 1): Partly met PT Short Term Goal 3 (Week 1): Pt will ambulate with LRAD about 25 feet with mod A.  PT Short Term Goal 3 - Progress (Week 1): Not met PT Short Term Goal 4 (Week 1): Pt will ascend/descend 2 stairs with 1 rail and max A.  PT Short Term Goal 4 - Progress (Week 1): Not met PT Short Term Goal 5 (Week 1): Pt will propel w/c about 150 feet with S.  PT Short Term Goal 5 - Progress (Week 1): Met  Skilled Therapeutic Interventions/Progress Updates:  Pt received sitting up in w/c.  W/c propulsion x 150' with supervision, cues for steering and avoiding obstacles on L.  Squat pivot w/c> R to mat with min/mod assist; mat> w/c to L with mod assist.  neuromuscular re-education via demo, VCs, manual cues, visual feedbackfor: -seated with feet supported> unsupported during  reaching activities that facilitate trunk shortening/lenthening/rotating -gait in hallway using railing on R, LAFO, x 20' with board on floor to reduce L hip adduction during swing phase, manual cues for L hip activation and knee stability -seated L knee extension x 10 reps after placement in flexion by PT, foot on pillow case for reduced friction  Pt tends to hold breath during challenging movements, and requires VCs to avoid this.  PT returned pt to room in w/c, quick release belt in place and all needs within reach.    Therapy Documentation Precautions:  Precautions Precautions: Fall Precaution Comments: left inattention, labile Restrictions Weight Bearing Restrictions: No Pain: Pain Assessment Pain Assessment: No/denies pain    See FIM for current functional status  Therapy/Group: Individual Therapy  Takina Busser 09/27/2014, 8:00 AM

## 2014-09-26 NOTE — Progress Notes (Signed)
Speech Language Pathology Daily Session Note  Patient Details  Name: Brian Hall MRN: [id number] Date of Birth: 06/06/1956  Today's Date: 09/26/2014 SLP Individual Time: 0830-0930 SLP Individual Time Calculation (min): 60 min  Short Term Goals: Week 2: SLP Short Term Goal 1 (Week 2): Patient will attend to left of environment and left upper extremity during functional tasks with Min verbal/question cues.    SLP Short Term Goal 2 (Week 2): Patient will demonsrate moderately complex problem solving during functional tasks with Mod assist. SLP Short Term Goal 3 (Week 2): Patient utilize external aids to assist with recall of daily information wtih Min verbal cues.    SLP Short Term Goal 4 (Week 2): Patient will select attention to functional tasks for 5-10 minutes with Min verbal cues for redirection.  SLP Short Term Goal 5 (Week 2): Patient will demonstrate appropriate turn taking with peers by self-monitoring interruptions with Supervision assist.  Skilled Therapeutic Interventions: Skilled treatment session focused on addressing cognition goals.  SLP facilitated session with Max faded to Mod multimodal cues for attention to left upper extremity duing moderately complex problem soving tasks.  Patient also required Supervision cues to scan to left duirng structured tasks.  SLP educated patient on importance of utilizing an organized left ro right pattern consistently during scanning tasks; patient verbalized understanding.  Continue with current plan of care.    FIM:  Comprehension Comprehension Mode: Auditory Comprehension: 5-Follows basic conversation/direction: With no assist Expression Expression Mode: Verbal Expression: 5-Expresses complex 90% of the time/cues < 10% of the time Social Interaction Social Interaction: 5-Interacts appropriately 90% of the time - Needs monitoring or encouragement for participation or interaction. Problem Solving Problem Solving: 5-Solves basic  problems: With no assist Memory Memory: 5-Requires cues to use assistive device  Pain Pain Assessment Pain Assessment: No/denies pain  Therapy/Group: Individual Therapy  Carmelia Roller., CCC-SLP [phone]  [place] 09/26/2014, 12:07 PM

## 2014-09-26 NOTE — Progress Notes (Addendum)
Occupational Therapy Session Note  Patient Details  Name: Brian Hall MRN: [id number] Date of Birth: 12/09/1955  Today's Date: 09/26/2014 OT Individual Time:  -   1300-1410  (70min)      Short Term Goals: Week 1:  OT Short Term Goal 1 (Week 1): Pt will transfer to BSC/toilet with mod A  OT Short Term Goal 1 - Progress (Week 1): Progressing toward goal OT Short Term Goal 2 (Week 1): Pt will don shirt with mod A with instructional cues OT Short Term Goal 2 - Progress (Week 1): Met OT Short Term Goal 3 (Week 1): Pt will perform sit to stand for clothing mangement with min  A OT Short Term Goal 3 - Progress (Week 1): Progressing toward goal OT Short Term Goal 4 (Week 1): Pt will attend to left visual field durning ADL (60min session) with mod cuing  OT Short Term Goal 4 - Progress (Week 1): Met Week 2:  OT Short Term Goal 1 (Week 2): Pt will perform shower transfer with Max A in order to decrease level of assistance for functional transfer. OT Short Term Goal 2 (Week 2): Pt will demonstrate purse lip breathing during ADL session in order to decrease SOB with functional activity. OT Short Term Goal 3 (Week 2): Pt will perform toilet transfer with Mod A in order to decrease level of assist for functional transfers. OT Short Term Goal 4 (Week 2): Pt will perform LB dressing with Mod A utilizing hemiplegic dressing techniques as needed. Week 3:     Skilled Therapeutic Interventions/Progress Updates:      Focus of treatment:   decreased L attention, decreased safety awareness, impulsive, Decreased strength and AROM in L UE/LE, decreased seated and standing balance, decreased functional mobility, decreased functional balance, decreased sensation, and decreased trunk control>  Pt propelled wc to gym with mod assist and multi modal cues to pay attention to left environment.    Pt. Transferred to mat with mod assist and cues for controlled movement.  Addressed LUE in weight bearing, AAROM with GE,  movements in transverse plane.  Wife observed session.  Cues pt to increase weight bearing on left hip and coming forward in middle when going from sit to stand.  Performed LUE on ball, stool, and mat.  Pt. Had good carry over with LUE in movements.  Propelled wc back to room and left with all needs in reach.     Therapy Documentation Precautions:  Precautions Precautions: Fall Precaution Comments: left inattention, labile Restrictions Weight Bearing Restrictions: No       Pain: Pain Assessment Pain Assessment: 0-10 Pain Score: 2  ADL: ADL ADL Comments: see FIM :   See FIM for current functional status  Therapy/Group: Individual Therapy  ,  J 09/26/2014, 12:57 PM  

## 2014-09-26 NOTE — Progress Notes (Signed)
Subjective/Complaints: Patient is without new issues overnight. Tolerating therapies. Feels like he is moving a little bit better on the left side. Also feels that he can sense cold better on the left side than he did previously Review of systems is significant for left-sided weakness.  Objective: Vital Signs: Blood pressure 134/75, pulse 63, temperature 97.5 F (36.4 C), temperature source Oral, resp. rate 18, weight 114.669 kg (252 lb 12.8 oz), SpO2 99 %. No results found. Results for orders placed or performed during the hospital encounter of 09/16/14 (from the past 72 hour(s))  Glucose, capillary     Status: Abnormal   Collection Time: 09/23/14 11:39 AM  Result Value Ref Range   Glucose-Capillary 233 (H) 70 - 99 mg/dL  Glucose, capillary     Status: Abnormal   Collection Time: 09/23/14  4:58 PM  Result Value Ref Range   Glucose-Capillary 220 (H) 70 - 99 mg/dL  Glucose, capillary     Status: Abnormal   Collection Time: 09/23/14  8:39 PM  Result Value Ref Range   Glucose-Capillary 182 (H) 70 - 99 mg/dL   Comment 1 Notify RN   Glucose, capillary     Status: Abnormal   Collection Time: 09/24/14  6:26 AM  Result Value Ref Range   Glucose-Capillary 186 (H) 70 - 99 mg/dL   Comment 1 Notify RN   Glucose, capillary     Status: Abnormal   Collection Time: 09/24/14 11:22 AM  Result Value Ref Range   Glucose-Capillary 223 (H) 70 - 99 mg/dL  Glucose, capillary     Status: Abnormal   Collection Time: 09/24/14  4:30 PM  Result Value Ref Range   Glucose-Capillary 192 (H) 70 - 99 mg/dL   Comment 1 Notify RN   Glucose, capillary     Status: Abnormal   Collection Time: 09/24/14  9:00 PM  Result Value Ref Range   Glucose-Capillary 149 (H) 70 - 99 mg/dL   Comment 1 Notify RN   Glucose, capillary     Status: Abnormal   Collection Time: 09/25/14  7:03 AM  Result Value Ref Range   Glucose-Capillary 166 (H) 70 - 99 mg/dL   Comment 1 Notify RN   Glucose, capillary     Status: Abnormal    Collection Time: 09/25/14 11:32 AM  Result Value Ref Range   Glucose-Capillary 179 (H) 70 - 99 mg/dL  Glucose, capillary     Status: Abnormal   Collection Time: 09/25/14  4:48 PM  Result Value Ref Range   Glucose-Capillary 157 (H) 70 - 99 mg/dL  Glucose, capillary     Status: Abnormal   Collection Time: 09/25/14  9:07 PM  Result Value Ref Range   Glucose-Capillary 119 (H) 70 - 99 mg/dL   Comment 1 Notify RN   Glucose, capillary     Status: Abnormal   Collection Time: 09/26/14  6:51 AM  Result Value Ref Range   Glucose-Capillary 143 (H) 70 - 99 mg/dL   Comment 1 Notify RN      HEENT: normal Cardio: RRR and no murmurs Resp: CTA B/L and unlabored GI: BS positive and nontender nondistended Extremity:  No Edema Skin:   Intact Neuro: Alert/Oriented, Cranial Nerve Abnormalities left central 7, Abnormal Sensory decreased sensation to light touch left upper and left lower extremity, Abnormal Motor 0 to trace /5 left upper extremity, 3 minus/5 in hip knee extensor synergy left lower extremity 0/5 at the ankle and Inattention Musc/Skel:  Other no pain with left shoulder  or left lower extremity range of motion Gen.no acute distress Mood and affect appropriate  Assessment/Plan: 1. Functional deficits secondary to right MCA infarct which require 3+ hours per day of interdisciplinary therapy in a comprehensive inpatient rehab setting. Physiatrist is providing close team supervision and 24 hour management of active medical problems listed below. Physiatrist and rehab team continue to assess barriers to discharge/monitor patient progress toward functional and medical goals.  FIM: FIM - Bathing Bathing Steps Patient Completed: Chest, Left Arm, Abdomen, Front perineal area, Right upper leg, Left upper leg Bathing: 3: Mod-Patient completes 5-7 39f 10 parts or 50-74%  FIM - Upper Body Dressing/Undressing Upper body dressing/undressing steps patient completed: Thread/unthread right sleeve of  pullover shirt/dresss, Thread/unthread left sleeve of pullover shirt/dress, Put head through opening of pull over shirt/dress, Pull shirt over trunk Upper body dressing/undressing: 5: Set-up assist to: Obtain clothing/put away FIM - Lower Body Dressing/Undressing Lower body dressing/undressing steps patient completed: Thread/unthread right underwear leg, Thread/unthread right pants leg, Don/Doff right shoe Lower body dressing/undressing: 2: Max-Patient completed 25-49% of tasks  FIM - Toileting Toileting: 0: Activity did not occur  FIM - Air cabin crew Transfers: 0-Activity did not occur  FIM - Control and instrumentation engineer Devices: Arm rests Bed/Chair Transfer: 3: Bed > Chair or W/C: Mod A (lift or lower assist), 3: Chair or W/C > Bed: Mod A (lift or lower assist)  FIM - Locomotion: Wheelchair Distance: 150 Locomotion: Wheelchair: 5: Travels 150 ft or more: maneuvers on rugs and over door sills with supervision, cueing or coaxing FIM - Locomotion: Ambulation Locomotion: Ambulation Assistive Devices: Other (comment), Orthosis (3 musketeers) Ambulation/Gait Assistance: 1: +2 Total assist Locomotion: Ambulation: 1: Two helpers  Comprehension Comprehension Mode: Auditory Comprehension: 5-Understands complex 90% of the time/Cues < 10% of the time  Expression Expression Mode: Verbal Expression: 5-Expresses complex 90% of the time/cues < 10% of the time  Social Interaction Social Interaction: 6-Interacts appropriately with others with medication or extra time (anti-anxiety, antidepressant).  Problem Solving Problem Solving: 5-Solves basic problems: With no assist  Memory Memory: 6-More than reasonable amt of time  Medical Problem List and Plan:  1. 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 DVT Prophylaxis/Anticoagulation: SQ heparin .Monitor platelet counts and any signs of DVT 3. Pain Management: tylenol as  needed 4. Diabetes Mellitus uncontrolledwith peripheral neuropathy.HGB A1C 7.7.SSI. Check blood sugars before meals and at bedtime. Patient on Glucophage 750 mg twice daily and Januvia 100 mg daily prior to admission.   Lantus   10U   -added amaryl 2mg  daily  -some improvement in CBG's    5. Neuropsych: This patient is capable of making decisions on his own behalf. 6. Skin/Wound Care: Routine skin checks 7. Fluids/Electrolytes/Nutrition:strict I&O.Follow up labs 8.Hypertension. Lisinopril discontinued 09/16/2014 secondary to mild elevation in creatinine to 1.85..(Patient had also been on Coreg 12.5 mg twice a day prior to admission and lisinopril 40 mg daily) Monitor with increased mobility and resume as needed 9. History throat cancer. Status post dissection with radiation chemotherapy 2011 10.Hypothyroidism.Synthroid 11.Diastolic CHF. Monitor for any signs of fluid overload 12.Hyperlipidemia. Crestor 13.GERD.Protonix  LOS (Days) 10 A FACE TO FACE EVALUATION WAS PERFORMED  Brian Hall 09/26/2014, 9:53 AM

## 2014-09-27 ENCOUNTER — Inpatient Hospital Stay (HOSPITAL_COMMUNITY): Payer: 59 | Admitting: Physical Therapy

## 2014-09-27 ENCOUNTER — Encounter (HOSPITAL_COMMUNITY): Payer: 59 | Admitting: Occupational Therapy

## 2014-09-27 LAB — GLUCOSE, CAPILLARY
GLUCOSE-CAPILLARY: 133 mg/dL — AB (ref 70–99)
GLUCOSE-CAPILLARY: 169 mg/dL — AB (ref 70–99)
Glucose-Capillary: 107 mg/dL — ABNORMAL HIGH (ref 70–99)
Glucose-Capillary: 150 mg/dL — ABNORMAL HIGH (ref 70–99)

## 2014-09-27 NOTE — Progress Notes (Signed)
Physical Therapy Session Note  Patient Details  Name: Brian Hall MRN: [id number] Date of Birth: 01-01-1956  Today's Date: 09/27/2014 PT Individual Time: 0930-1030 (Co-tx with rec therapist) PT Individual Time Calculation (min): 60 min  Short Term Goals: Week 2:  PT Short Term Goal 1 (Week 2): pt will perform basic transfer with min assist to R and L. PT Short Term Goal 2 (Week 2): pt will perform gait x 25' with LRAD and assist of 1 person PT Short Term Goal 3 (Week 2): pt will ascend/descend 2 stairs 1 rail max assist PT Short Term Goal 4 (Week 2): pt will move sit> supine with min assist.  Skilled Therapeutic Interventions/Progress Updates:    Co-treatment with recreational therapist focusing on gait training, symmetrical weightbearing during functional mobility. Pt receieved seated in w/c accompanied by wife; agreeable to therapy. Transported pt to hallway, where pt performed gait 2 x30' and 1x50' with +2A, L AFO, and toe cap. Initial gait trial ith Harmon Pier walker; subsequent trials with 3 musketeers assist due to difficulty maintaining LUE on Eva walker, decreased postural control with increased fatigue. Rec therapist provided overall stability, manual facilitation of lateral weight shift to R side, and manually advanced Eva walker while this PT also provided manual facilitation of lateral weight shift to R side, manual redirection of LLE advancement (due to excessive LLE adduction), tactile cueing for LLE stance stability, max cueing for upright posture/forward gaze.   Trialed three different AFO's to assess effect on gait pattern and determined that pt exhibited most effective LLE clearance with Reaction but L genu recurvatum most controlled by [facility] (posterior leaf spring). In future sessions, will consider gait training with Reaction and heel lift. See below for detailed description of NMR. Session ended in pt room, where pt was left seated in w/c accompanied by wife with all needs  within reach.  Therapy Documentation Precautions:  Precautions Precautions: Fall Precaution Comments: left inattention, labile Restrictions Weight Bearing Restrictions: No Vital Signs: Therapy Vitals Temp: 98.8 F (37.1 C) Temp Source: Oral Pulse Rate: (!) 58 Resp: 18 BP: (!) 153/82 mmHg Patient Position (if appropriate): Lying Oxygen Therapy SpO2: 99 % O2 Device: Not Delivered Pain: Pain Assessment Pain Assessment: No/denies pain NMR  Neuromuscular Facilitation: Left;Upper Extremity;Lower Extremity;Forced use;Activity to increase lateral weight shifting;Activity to increase anterior-posterior weight shifting;Activity to increase sustained activation;Activity to increase motor control - While seated EOM, pt performed bilat scooting, multiple sit<>partial stand and sit<>stand transfers bilat UE weightbearing on kaye bench (positioned in front of pt) to promote anterior weight shift, increased LUE/LLE activation, and symmetrical LE weightbearing during transitional movements. Tactile cueing required at L knee for increased weight bearing; manual stabilization of LUE on kaye bench also provided. - Performed multiple sit>stand transfers with concurrent RUE reaching (anterior and across midline) to promote midline orientation, symmetrical weight bearing during transfers. Once standing, pt tossed horse shoe with RUE with mod to max A for stability.  See FIM for current functional status  Therapy/Group: Co-Treatment  [place], Malva Cogan 09/27/2014, 7:38 PM

## 2014-09-27 NOTE — Progress Notes (Signed)
Occupational Therapy Session Note  Patient Details  Name: Brian Hall MRN: [id number] Date of Birth: [birth year]  Today's Date: 09/27/2014 OT Individual Time: 1300-1400 OT Individual Time Calculation (min): 60 min    Short Term Goals: Week 2:  OT Short Term Goal 1 (Week 2): Pt will perform shower transfer with Max A in order to decrease level of assistance for functional transfer. OT Short Term Goal 2 (Week 2): Pt will demonstrate purse lip breathing during ADL session in order to decrease SOB with functional activity. OT Short Term Goal 3 (Week 2): Pt will perform toilet transfer with Mod A in order to decrease level of assist for functional transfers. OT Short Term Goal 4 (Week 2): Pt will perform LB dressing with Mod A utilizing hemiplegic dressing techniques as needed.  Skilled Therapeutic Interventions/Progress Updates:    1:1 Pt declined B/D this afternoon and desiring to go to the gym  Neuro muscular reeducation: focus on transfer training with proper w/c setup, maintaining forward weight shift with transfer, and having controlled movement with functional transfers. On mat performed heavy weight bearing through left UE while using right UE during functional reach task to obtain objects outside of BOS. Min A to help stabilize joint with max cuing for "turning on" muscle activiity in left UE. Transitioned to performing functional reach task with left Ue with A against gravity. Pt moved into supine position and continued to address normal patterns of movement; going through PNF patterns and functional movements performed in bathing and eating. Also performed these in sitting. Pt required min A for A against gravity but able to initiate the movement with extra time and encouragement. Tactile cues for avoidance of "hiking" shoulder with movement and not using compensatory patterns. In supine also addressed trunk/ core stability: performing bridging and holding a bridge position and rolling on  flat mat.  (tactile and verbal cues for maintaining LE in proper position and attention to left.    Focused on Sit to stand form EOM at midline with putting right hand on left knee. Finding and maintaining midline in standing with attention to mm activation in left LE. Stand to sit with forward lean and with control with mod A. Pt propelled himself back to room with supervision with extra time to avoid obstacles on left. Wife observed part of session.    Therapy Documentation Precautions:  Precautions Precautions: Fall Precaution Comments: left inattention, labile Restrictions Weight Bearing Restrictions: No Pain: No c/o pain in session  ADL: ADL ADL Comments: see FIM  See FIM for current functional status  Therapy/Group: Individual Therapy  Willeen Cass [facility] 09/27/2014, 3:20 PM

## 2014-09-27 NOTE — Progress Notes (Signed)
Recreational Therapy Assessment and Plan  Patient Details  Name: Brian Hall MRN: [id number] Date of Birth: 12/09/1955 Today's Date: 09/27/2014  Rehab Potential: Good ELOS: 3 weeks   Assessment Clinical Impression:Problem List:  Patient Active Problem List   Diagnosis Date Noted  . Acute ischemic right MCA stroke 09/16/2014  . Left hemiparesis   . CVA (cerebral vascular accident)   . Right carotid artery occlusion   . Acute right MCA stroke   . Essential hypertension 09/12/2014  . CVA (cerebral infarction) 09/12/2014  . Type 2 diabetes mellitus with peripheral neuropathy 09/12/2014    Past Medical History:  Past Medical History  Diagnosis Date  . Hypertension   . Diabetes mellitus   . Obesity   . CHF (congestive heart failure)   . SOB (shortness of breath)   . Hyperkalemia   . Renal insufficiency   . Cardiogenic shock   . Cardiac LV ejection fraction 10-20%   . Hyperlipemia   . Hypothyroidism   . Throat cancer 2011    s/p neck dissection, radiation, chemo   Past Surgical History:  Past Surgical History  Procedure Laterality Date  . Cardiac catheterization    . Neck dissection Right 2011    s/p resection of throat cancer with multiple nodes removed    Assessment & Plan Clinical Impression: Patient is a 59 y.o. year old male right handed male with history of hypertension, diastolic congestive heart failure, diabetes mellitus and peripheral neuropathy and throat cancer with neck dissection and radiation chemotherapy 2011. Patient lives with his wife and was independent prior to admission. Presented 09/12/2014 with left-sided weakness and slurred speech. MRI of the brain showed acute large territory right MCA infarct involving right frontal lobe as well as remote infarcts medial left occipital lobe and left cerebellar hemisphere. MRA of the head with occluded right internal  carotid artery. Echocardiogram with ejection fraction of 35% no source of embolism. Carotid Dopplers with right ICA occlusion. Patient did not receive TPA. Neurology consulted and maintained on aspirin as well as Plavix for CVA prophylaxis 3 months then Plavix alone. Subcutaneous heparin for DVT prophylaxis. Mild elevation in creatinine from baseline 1.31-1.85 and lisinopril was discontinued with plan follow-up labs. Patient is tolerating a regular consistency diet. Patient transferred to CIR on 09/16/2014 .   Pt presents with decreased activity tolerance, decreased functional mobility, decreased balance, decreased coordination, left inattention, decreased attention, decreased awareness, decreased problem solving, decreased safety awareness, decreased memory, decreased leisure awareness Limiting pt's independence with leisure/community pursuits.   Leisure History/Participation Premorbid leisure interest/current participation: Petra Kuba - Other (Comment) (riding his harley, working on [employer] cars) Futures trader Resources: Fair-identify 2 post discharge leisure resources Strengths/Weaknesses TR Patient demonstrates impairments in the following area(s): Endurance;Motor;Pain;Perception;Safety;Skin Integrity  Plan Rec Therapy Plan Is patient appropriate for Therapeutic Recreation?: Yes Rehab Potential: Good Treatment times per week: Min 1 time per week >20 minutes Estimated Length of Stay: 3 weeks TR Treatment/Interventions: Adaptive equipment instruction;1:1 session;Balance/vestibular training;Functional mobility training;Community reintegration;Cognitive remediation/compensation;Leisure education;Group participation (Comment);Patient/family education;Therapeutic activities;Recreation/leisure participation;Therapeutic exercise;UE/LE Coordination activities;Wheelchair propulsion/positioning  Recommendations for other services: None  Discharge Criteria: Patient will be discharged from TR if  patient refuses treatment 3 consecutive times without medical reason.  If treatment goals not met, if there is a change in medical status, if patient makes no progress towards goals or if patient is discharged from hospital.  The above assessment, treatment plan, treatment alternatives and goals were discussed and mutually agreed upon: by patient.  Brian Hall 09/27/2014,  3:58 PM

## 2014-09-27 NOTE — Progress Notes (Signed)
Speech Language Pathology Daily Session Note  Patient Details  Name: Brian Hall MRN: [id number] Date of Birth: 08/20/1956  Today's Date: 09/27/2014 SLP Group Time: 1100-1200 SLP Group Time Calculation (min): 60 min  Short Term Goals: Week 2: SLP Short Term Goal 1 (Week 2): Patient will attend to left of environment and left upper extremity during functional tasks with Min verbal/question cues.    SLP Short Term Goal 2 (Week 2): Patient will demonsrate moderately complex problem solving during functional tasks with Mod assist. SLP Short Term Goal 3 (Week 2): Patient utilize external aids to assist with recall of daily information wtih Min verbal cues.    SLP Short Term Goal 4 (Week 2): Patient will select attention to functional tasks for 5-10 minutes with Min verbal cues for redirection.  SLP Short Term Goal 5 (Week 2): Patient will demonstrate appropriate turn taking with peers by self-monitoring interruptions with Supervision assist.  Skilled Therapeutic Interventions: Skilled group treatment session focused on addressing cognition goals.  SLP facilitated session with Min multimodal cues for attention to left upper extremity during a moderately complex problem solving task.  Patient also required Supervision cues to scan to left during conversational exchanges with peer and SLP.  Patient was able to identify needs after discharge with Min question cues.  Continue with current plan of care.   FIM:  Comprehension Comprehension Mode: Auditory Comprehension: 5-Understands complex 90% of the time/Cues < 10% of the time Expression Expression Mode: Verbal Expression: 5-Expresses complex 90% of the time/cues < 10% of the time Social Interaction Social Interaction: 6-Interacts appropriately with others with medication or extra time (anti-anxiety, antidepressant). Problem Solving Problem Solving: 5-Solves basic problems: With no assist Memory Memory: 5-Requires cues to use assistive  device  Pain Pain Assessment Pain Assessment: No/denies pain Pain Score: 3   Therapy/Group: Group Therapy  Carmelia Roller., CCC-SLP [phone]  Brian Hall 09/27/2014, 4:59 PM

## 2014-09-27 NOTE — Progress Notes (Signed)
Subjective/Complaints: No pain on the left side. Tries to keep his left shoulder moving. No bowel dysfunction no bladder dysfunction noted. Tolerating therapy well Wife having questions about ramp being built prior to discharge. Review of systems is significant for left-sided weakness.  Objective: Vital Signs: Blood pressure 153/82, pulse 58, temperature 98.8 F (37.1 C), temperature source Oral, resp. rate 18, weight 114.669 kg (252 lb 12.8 oz), SpO2 99 %. No results found. Results for orders placed or performed during the hospital encounter of 09/16/14 (from the past 72 hour(s))  Glucose, capillary     Status: Abnormal   Collection Time: 09/24/14 11:22 AM  Result Value Ref Range   Glucose-Capillary 223 (H) 70 - 99 mg/dL  Glucose, capillary     Status: Abnormal   Collection Time: 09/24/14  4:30 PM  Result Value Ref Range   Glucose-Capillary 192 (H) 70 - 99 mg/dL   Comment 1 Notify RN   Glucose, capillary     Status: Abnormal   Collection Time: 09/24/14  9:00 PM  Result Value Ref Range   Glucose-Capillary 149 (H) 70 - 99 mg/dL   Comment 1 Notify RN   Glucose, capillary     Status: Abnormal   Collection Time: 09/25/14  7:03 AM  Result Value Ref Range   Glucose-Capillary 166 (H) 70 - 99 mg/dL   Comment 1 Notify RN   Glucose, capillary     Status: Abnormal   Collection Time: 09/25/14 11:32 AM  Result Value Ref Range   Glucose-Capillary 179 (H) 70 - 99 mg/dL  Glucose, capillary     Status: Abnormal   Collection Time: 09/25/14  4:48 PM  Result Value Ref Range   Glucose-Capillary 157 (H) 70 - 99 mg/dL  Glucose, capillary     Status: Abnormal   Collection Time: 09/25/14  9:07 PM  Result Value Ref Range   Glucose-Capillary 119 (H) 70 - 99 mg/dL   Comment 1 Notify RN   Glucose, capillary     Status: Abnormal   Collection Time: 09/26/14  6:51 AM  Result Value Ref Range   Glucose-Capillary 143 (H) 70 - 99 mg/dL   Comment 1 Notify RN   Glucose, capillary     Status: Abnormal    Collection Time: 09/26/14 11:55 AM  Result Value Ref Range   Glucose-Capillary 176 (H) 70 - 99 mg/dL  Glucose, capillary     Status: Abnormal   Collection Time: 09/26/14  4:33 PM  Result Value Ref Range   Glucose-Capillary 126 (H) 70 - 99 mg/dL  Glucose, capillary     Status: Abnormal   Collection Time: 09/26/14  9:00 PM  Result Value Ref Range   Glucose-Capillary 231 (H) 70 - 99 mg/dL  Glucose, capillary     Status: Abnormal   Collection Time: 09/27/14  7:11 AM  Result Value Ref Range   Glucose-Capillary 133 (H) 70 - 99 mg/dL   Comment 1 Notify RN      HEENT: normal Cardio: RRR and no murmurs Resp: CTA B/L and unlabored GI: BS positive and nontender nondistended Extremity:  No Edema Skin:   Intact Neuro: Alert/Oriented, Cranial Nerve Abnormalities left central 7, Abnormal Sensory decreased sensation to light touch left upper and left lower extremity, Abnormal Motor 0 to trace /5 left upper extremity, 3 minus/5 in hip knee extensor synergy left lower extremity 0/5 at the ankle and Inattention Musc/Skel:  Other no pain with left shoulder or left lower extremity range of motion Gen.no acute distress Mood  and affect appropriate  Assessment/Plan: 1. Functional deficits secondary to right MCA infarct which require 3+ hours per day of interdisciplinary therapy in a comprehensive inpatient rehab setting. Physiatrist is providing close team supervision and 24 hour management of active medical problems listed below. Physiatrist and rehab team continue to assess barriers to discharge/monitor patient progress toward functional and medical goals. Disc discharge dated 10/14/14, wife is trying to organize family and friends to build a ramp FIM: FIM - Bathing Bathing Steps Patient Completed: Chest, Left Arm, Abdomen, Front perineal area, Right upper leg, Left upper leg Bathing: 3: Mod-Patient completes 5-7 90f 10 parts or 50-74%  FIM - Upper Body Dressing/Undressing Upper body  dressing/undressing steps patient completed: Thread/unthread right sleeve of pullover shirt/dresss, Thread/unthread left sleeve of pullover shirt/dress, Put head through opening of pull over shirt/dress, Pull shirt over trunk Upper body dressing/undressing: 5: Set-up assist to: Obtain clothing/put away FIM - Lower Body Dressing/Undressing Lower body dressing/undressing steps patient completed: Thread/unthread right underwear leg, Thread/unthread right pants leg, Don/Doff right shoe Lower body dressing/undressing: 2: Max-Patient completed 25-49% of tasks  FIM - Toileting Toileting: 0: Activity did not occur  FIM - Air cabin crew Transfers: 0-Activity did not occur  FIM - Control and instrumentation engineer Devices: Arm rests Bed/Chair Transfer: 3: Bed > Chair or W/C: Mod A (lift or lower assist), 3: Chair or W/C > Bed: Mod A (lift or lower assist)  FIM - Locomotion: Wheelchair Distance: 150 Locomotion: Wheelchair: 5: Travels 150 ft or more: maneuvers on rugs and over door sills with supervision, cueing or coaxing FIM - Locomotion: Ambulation Locomotion: Ambulation Assistive Devices: Other (comment), Orthosis (3 musketeers) Ambulation/Gait Assistance: 1: +2 Total assist Locomotion: Ambulation: 1: Two helpers  Comprehension Comprehension Mode: Auditory Comprehension: 5-Follows basic conversation/direction: With no assist  Expression Expression Mode: Verbal Expression: 5-Expresses complex 90% of the time/cues < 10% of the time  Social Interaction Social Interaction: 5-Interacts appropriately 90% of the time - Needs monitoring or encouragement for participation or interaction.  Problem Solving Problem Solving: 5-Solves basic problems: With no assist  Memory Memory: 5-Requires cues to use assistive device  Medical Problem List and Plan:  1. 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 DVT Prophylaxis/Anticoagulation: SQ  heparin .Monitor platelet counts and any signs of DVT 3. Pain Management: tylenol as needed 4. Diabetes Mellitus uncontrolledwith peripheral neuropathy.HGB A1C 7.7.SSI. Check blood sugars before meals and at bedtime. Patient on Glucophage 750 mg twice daily and Januvia 100 mg daily prior to admission.   Lantus   10U   -added amaryl 2mg  daily  -some improvement in CBG's    5. Neuropsych: This patient is capable of making decisions on his own behalf. 6. Skin/Wound Care: Routine skin checks 7. Fluids/Electrolytes/Nutrition:strict I&O.Follow up labs 8.Hypertension. Lisinopril discontinued 09/16/2014 secondary to mild elevation in creatinine to 1.85..(Patient had also been on Coreg 12.5 mg twice a day prior to admission and lisinopril 40 mg daily) Monitor with increased mobility and resume as needed 9. History throat cancer. Status post dissection with radiation chemotherapy 2011 10.Hypothyroidism.Synthroid 11.Diastolic CHF. Monitor for any signs of fluid overload 12.Hyperlipidemia. Crestor 13.GERD.Protonix  LOS (Days) 11 A FACE TO FACE EVALUATION WAS PERFORMED  Ronica Vivian E 09/27/2014, 8:39 AM

## 2014-09-28 ENCOUNTER — Inpatient Hospital Stay (HOSPITAL_COMMUNITY): Payer: 59 | Admitting: Speech Pathology

## 2014-09-28 ENCOUNTER — Inpatient Hospital Stay (HOSPITAL_COMMUNITY): Payer: 59 | Admitting: Occupational Therapy

## 2014-09-28 ENCOUNTER — Inpatient Hospital Stay (HOSPITAL_COMMUNITY): Payer: 59 | Admitting: Physical Therapy

## 2014-09-28 LAB — GLUCOSE, CAPILLARY
Glucose-Capillary: 132 mg/dL — ABNORMAL HIGH (ref 70–99)
Glucose-Capillary: 164 mg/dL — ABNORMAL HIGH (ref 70–99)
Glucose-Capillary: 103 mg/dL — ABNORMAL HIGH (ref 70–99)
Glucose-Capillary: 119 mg/dL — ABNORMAL HIGH (ref 70–99)

## 2014-09-28 NOTE — Progress Notes (Signed)
Occupational Therapy Session Note  Patient Details  Name: Brian Hall MRN: [id number] Date of Birth: [birth year]  Today's Date: 09/28/2014 OT Individual Time: 7867-5449 OT Individual Time Calculation (min): 60 min    Short Term Goals: Week 2:  OT Short Term Goal 1 (Week 2): Pt will perform shower transfer with Max A in order to decrease level of assistance for functional transfer. OT Short Term Goal 2 (Week 2): Pt will demonstrate purse lip breathing during ADL session in order to decrease SOB with functional activity. OT Short Term Goal 3 (Week 2): Pt will perform toilet transfer with Mod A in order to decrease level of assist for functional transfers. OT Short Term Goal 4 (Week 2): Pt will perform LB dressing with Mod A utilizing hemiplegic dressing techniques as needed.  Skilled Therapeutic Interventions/Progress Updates:  Upon entering the room, pt seated in wheelchair awaiting therapist this session. Pt with no c/o pain this session. Pt agreeable to bathing at shower level. Pt transferred total A via STEDY from wheelchair onto TTB. Pt performing bathing with Min A to wash buttocks. Pt utilizing [place] reacher to wash R arm and B feet. Pt incorporated L UE into bathing with HOH assist but initiated task on his own. Pt exiting shower on STEDY where he sat for grooming tasks at sink side utilizing mirror for midline orientation. UB dressing with supervision this session. LB dressing with Max A as pt only able to thread R pant and donn R shoe. Pt standing within STEDY with assist from therapist to pull up pants. STS from wheelchair into STEDY with Mod A and STS from elevated STEDY surface with contact guard. Pt with increased fatigue and SOB with OT encouraging purse lip breathing during session. Pt seated in wheelchair with QRB and L arm tray donned and call bell with all other needed items within reach upon exiting the room.   Therapy Documentation Precautions:  Precautions Precautions:  Fall Precaution Comments: left inattention, labile Restrictions Weight Bearing Restrictions: No Pain: Pain Assessment Pain Assessment: 0-10 Pain Score: 0-No pain Pain Type: Chronic pain Pain Location: Back Pain Intervention(s): Medication (See eMAR) ADL: ADL ADL Comments: see FIM  See FIM for current functional status  Therapy/Group: Individual Therapy  [name] 09/28/2014, 11:21 AM

## 2014-09-28 NOTE — Progress Notes (Signed)
Physical Therapy Session Note  Patient Details  Name: Brian Hall MRN: [id number] Date of Birth: [birth year]  Today's Date: 09/28/2014 PT Individual Time: 0830-0930 PT Individual Time Calculation (min): 60 min   Short Term Goals: Week 2:  PT Short Term Goal 1 (Week 2): pt will perform basic transfer with min assist to R and L. PT Short Term Goal 2 (Week 2): pt will perform gait x 25' with LRAD and assist of 1 person PT Short Term Goal 3 (Week 2): pt will ascend/descend 2 stairs 1 rail max assist PT Short Term Goal 4 (Week 2): pt will move sit> supine with min assist.  Skilled Therapeutic Interventions/Progress Updates:   Pt received lying in bed, agreeable to therapy session.  Performed bed mobility at mod A level with max A cues to recall rolling technique to get OOB.  Once at EOB, assisted with donning shoes.  Transferred to w/c via squat pivot at mod A level.  Continues to require cues for L foot placement during transfer.  Pt self propelled to therapy gym x 150' using R hemi technique at S level today with continued mod/max verbal cues for scanning L environment.  Once in therapy gym, transferred to/from therapy mat as stated above via squat pivot.  Skilled session focused on sit<>stands, standing balance, orientation to midline, trunk control and advancing/retrostepping R LE in order to increase weight shift to the R.  Also when stepping provided target high to the R for clothes pins to further increase weight shift to the R. Requires mod to total A at times due to increased weight shift to the L with decreased WB through LLE.  Progressed to same task with stairs for UE support and also for visual target to move hips towards.  Also progressed to using R UE on rolling table to decrease RUE use.  Again, provided up to min/mod A with use of mirror for increased visual feedback.  Note that he does much better with use of mirror.  Continue to note that LE strength is improving, however continues  to have very poor trunk control.  Assisted back to w/c and back to room.  Left in w/c with quick release belt donned and all needs in reach.    Therapy Documentation Precautions:  Precautions Precautions: Fall Precaution Comments: left inattention, labile Restrictions Weight Bearing Restrictions: No   Pain: Pt with no c/o pain during session.   See FIM for current functional status  Therapy/Group: Individual Therapy  Denice Bors 09/28/2014, 9:59 AM

## 2014-09-28 NOTE — Progress Notes (Signed)
Social Work Patient ID: Brian Hall, male   DOB: Mar 06, 1956, 59 y.o.   MRN: [id number] Met whit pt and wife to discuss team conference progression toward his goals-supervision/min level and discharge 2/12.  Both pleased with his progress in therapies. Wife is filling out SSD application and will provide her with names of MD's and medical information.  Pt is trying his best in therapies.  Will work toward discharge date.

## 2014-09-28 NOTE — Patient Care Conference (Signed)
Inpatient RehabilitationTeam Conference and Plan of Care Update Date: 09/28/2014   Time: 11;25 AM    Patient Name: Brian Hall      Medical Record Number: [id number]  Date of Birth: 02-17-1956 Sex: Male         Room/Bed: 4W06C/4W06C-01 Payor Info: Payor: [employer] / Plan: Electrical engineer / Product Type: *No Product type* /    Admitting Diagnosis: cva  Admit Date/Time:  09/16/2014  3:20 PM Admission Comments: No comment available   Primary Diagnosis:  Acute ischemic right MCA stroke Principal Problem: Acute ischemic right MCA stroke  Patient Active Problem List   Diagnosis Date Noted  . Left-sided neglect 09/23/2014  . Acute ischemic right MCA stroke 09/16/2014  . Left hemiparesis   . CVA (cerebral vascular accident)   . Right carotid artery occlusion   . Acute right MCA stroke   . Essential hypertension 09/12/2014  . CVA (cerebral infarction) 09/12/2014  . Type 2 diabetes mellitus with peripheral neuropathy 09/12/2014    Expected Discharge Date: Expected Discharge Date: 10/14/14  Team Members Present: Physician leading conference: Dr. Alysia Penna Social Worker Present: Ovidio Kin, LCSW Nurse Present: Heather Roberts, RN PT Present: Raylene Everts, PT;Emily Parcell, PT;Melba Araki Jari Favre, PT OT Present: Benay Pillow, Maryella Shivers, OT SLP Present: Gunnar Fusi, SLP PPS Coordinator present : Daiva Nakayama, RN, CRRN     Current Status/Progress Goal Weekly Team Focus  Medical   Emotional lability  Home d/c  address lability, neuropsych eval   Bowel/Bladder   pt cont. of bowel and bladder. Needs assitance with urinal at times.  Managed bowel and bladder Min assist  cont. to encourage pt to use urinal without assistance   Swallow/Nutrition/ Hydration             ADL's   min A to mod A for squat pivot, increased functional movement in left UE- Brunstrom III  supervision to min assist level  self care, transfer training, neuro reedu right UE, normal patterns of movement;  controlled movements, continunes to wera a brief;    Mobility   Mod A transfers, Max A dynamic standing balance, +2A for gait  min A overall  L NMR, midline orientation, sitting/standing balance, balanced muscle activation during transitional movements, postural/gait stability, continued pt/family education   Communication   Supervision   Supervision   goals met   Safety/Cognition/ Behavioral Observations  Min-Mod assist depending on complexity of task   Min assist   continue to increase left attention in large environments and left upper extremity    Pain   pain to lower back- ultram 63m PRN q6hrs  3 or less  assess pain and medicate as needed   Skin   skin CDI            *See Care Plan and progress notes for long and short-term goals.  Barriers to Discharge: Heavy assist level    Possible Resolutions to Barriers:  family training    Discharge Planning/Teaching Needs:  Home with wife who is here daily and participating in his care.      Team Discussion:  Midline/balance issues working on. Labile form stroke, will have Neuro-psych see for coping. Wife having ramp built. Back pain being managed by meds. DM under good control. Currently mod/ max level  Revisions to Treatment Plan:  None   Continued Need for Acute Rehabilitation Level of Care: The patient requires daily medical management by a physician with specialized training in physical medicine and rehabilitation for the following conditions:  Daily direction of a multidisciplinary physical rehabilitation program to ensure safe treatment while eliciting the highest outcome that is of practical value to the patient.: Yes Daily medical management of patient stability for increased activity during participation in an intensive rehabilitation regime.: Yes Daily analysis of laboratory values and/or radiology reports with any subsequent need for medication adjustment of medical intervention for : Neurological problems  Brian Hall,  Brian Hall 09/28/2014, 3:20 PM

## 2014-09-28 NOTE — Progress Notes (Signed)
Speech Language Pathology Daily Session Note  Patient Details  Name: Brian Hall MRN: [id number] Date of Birth: [birth year]  Today's Date: 09/28/2014 SLP Individual Time: 1410-1510 SLP Individual Time Calculation (min): 60 min  Short Term Goals: Week 2: SLP Short Term Goal 1 (Week 2): Patient will attend to left of environment and left upper extremity during functional tasks with Min verbal/question cues.    SLP Short Term Goal 2 (Week 2): Patient will demonsrate moderately complex problem solving during functional tasks with Mod assist. SLP Short Term Goal 3 (Week 2): Patient utilize external aids to assist with recall of daily information wtih Min verbal cues.    SLP Short Term Goal 4 (Week 2): Patient will select attention to functional tasks for 5-10 minutes with Min verbal cues for redirection.  SLP Short Term Goal 5 (Week 2): Patient will demonstrate appropriate turn taking with peers by self-monitoring interruptions with Supervision assist.  Skilled Therapeutic Interventions: Skilled treatment session focused on addressing cognition goals.  SLP facilitated session with Mod multimodal cues for attention to left upper extremity during a moderately complex problem solving task that required patient to construct a 3D puzzle from a color photo.  SLP also facilitated session with Min question cues to self-monitor and correct errors.  Patient was verbally given 4 places/items to locate on unit which he did during a wheelchair mobility task with Min repetition cues to initially store information, Supervision question cues and extra time then recall and locate with adequate left attention.  Continue with current plan of care.   FIM:  Comprehension Comprehension Mode: Auditory Comprehension: 5-Understands complex 90% of the time/Cues < 10% of the time Expression Expression Mode: Verbal Expression: 5-Expresses complex 90% of the time/cues < 10% of the time Social Interaction Social  Interaction: 6-Interacts appropriately with others with medication or extra time (anti-anxiety, antidepressant). Problem Solving Problem Solving: 5-Solves complex 90% of the time/cues < 10% of the time Memory Memory: 6-More than reasonable amt of time FIM - Eating Eating Activity: 5: Set-up assist for open containers;5: Set-up assist for cut food  Pain Pain Assessment Pain Assessment: No/denies pain  Therapy/Group: Individual Therapy  Carmelia Roller., CCC-SLP [phone]  [place] 09/28/2014, 7:58 PM

## 2014-09-28 NOTE — Progress Notes (Signed)
Subjective/Complaints: No issues overnite, trying to move LUE  Review of systems is significant for left-sided weakness.  Objective: Vital Signs: Blood pressure 136/70, pulse 74, temperature 97.5 F (36.4 C), temperature source Oral, resp. rate 19, weight 114.669 kg (252 lb 12.8 oz), SpO2 99 %. No results found. Results for orders placed or performed during the hospital encounter of 09/16/14 (from the past 72 hour(s))  Glucose, capillary     Status: Abnormal   Collection Time: 09/25/14 11:32 AM  Result Value Ref Range   Glucose-Capillary 179 (H) 70 - 99 mg/dL  Glucose, capillary     Status: Abnormal   Collection Time: 09/25/14  4:48 PM  Result Value Ref Range   Glucose-Capillary 157 (H) 70 - 99 mg/dL  Glucose, capillary     Status: Abnormal   Collection Time: 09/25/14  9:07 PM  Result Value Ref Range   Glucose-Capillary 119 (H) 70 - 99 mg/dL   Comment 1 Notify RN   Glucose, capillary     Status: Abnormal   Collection Time: 09/26/14  6:51 AM  Result Value Ref Range   Glucose-Capillary 143 (H) 70 - 99 mg/dL   Comment 1 Notify RN   Glucose, capillary     Status: Abnormal   Collection Time: 09/26/14 11:55 AM  Result Value Ref Range   Glucose-Capillary 176 (H) 70 - 99 mg/dL  Glucose, capillary     Status: Abnormal   Collection Time: 09/26/14  4:33 PM  Result Value Ref Range   Glucose-Capillary 126 (H) 70 - 99 mg/dL  Glucose, capillary     Status: Abnormal   Collection Time: 09/26/14  9:00 PM  Result Value Ref Range   Glucose-Capillary 231 (H) 70 - 99 mg/dL  Glucose, capillary     Status: Abnormal   Collection Time: 09/27/14  7:11 AM  Result Value Ref Range   Glucose-Capillary 133 (H) 70 - 99 mg/dL   Comment 1 Notify RN   Glucose, capillary     Status: Abnormal   Collection Time: 09/27/14 12:08 PM  Result Value Ref Range   Glucose-Capillary 169 (H) 70 - 99 mg/dL   Comment 1 Notify RN   Glucose, capillary     Status: Abnormal   Collection Time: 09/27/14  5:33 PM   Result Value Ref Range   Glucose-Capillary 107 (H) 70 - 99 mg/dL  Glucose, capillary     Status: Abnormal   Collection Time: 09/27/14  9:02 PM  Result Value Ref Range   Glucose-Capillary 150 (H) 70 - 99 mg/dL  Glucose, capillary     Status: Abnormal   Collection Time: 09/28/14  7:10 AM  Result Value Ref Range   Glucose-Capillary 132 (H) 70 - 99 mg/dL     HEENT: normal Cardio: RRR and no murmurs Resp: CTA B/L and unlabored GI: BS positive and nontender nondistended Extremity:  No Edema Skin:   Intact Neuro: Alert/Oriented, Cranial Nerve Abnormalities left central 7, Abnormal Sensory decreased sensation to light touch left upper and left lower extremity, Abnormal Motor 2/5 Left shoulder Add, o/w  Trace in R UE, 3 minus/5 in hip knee extensor synergy left lower extremity 0/5 at the ankle and Inattention Musc/Skel:  Other no pain with left shoulder or left lower extremity range of motion Gen.no acute distress Mood and affect appropriate  Assessment/Plan: 1. Functional deficits secondary to right MCA infarct which require 3+ hours per day of interdisciplinary therapy in a comprehensive inpatient rehab setting. Physiatrist is providing close team supervision and 24  hour management of active medical problems listed below. Physiatrist and rehab team continue to assess barriers to discharge/monitor patient progress toward functional and medical goals. Team conference today please see physician documentation under team conference tab, met with team face-to-face to discuss problems,progress, and goals. Formulized individual treatment plan based on medical history, underlying problem and comorbidities. FIM: FIM - Bathing Bathing Steps Patient Completed: Chest, Left Arm, Abdomen, Front perineal area, Right upper leg, Left upper leg Bathing: 3: Mod-Patient completes 5-7 [id number] parts or 50-74%  FIM - Upper Body Dressing/Undressing Upper body dressing/undressing steps patient completed:  Thread/unthread right sleeve of pullover shirt/dresss, Thread/unthread left sleeve of pullover shirt/dress, Put head through opening of pull over shirt/dress, Pull shirt over trunk Upper body dressing/undressing: 5: Set-up assist to: Obtain clothing/put away FIM - Lower Body Dressing/Undressing Lower body dressing/undressing steps patient completed: Thread/unthread right underwear leg, Thread/unthread right pants leg, Don/Doff right shoe Lower body dressing/undressing: 2: Max-Patient completed 25-49% of tasks  FIM - Toileting Toileting: 0: Activity did not occur  FIM - TAir cabin crewTransfers: 0-Activity did not occur  FIM - BControl and instrumentation engineerDevices: Arm rests Bed/Chair Transfer: 3: Bed > Chair or W/C: Mod A (lift or lower assist), 3: Chair or W/C > Bed: Mod A (lift or lower assist)  FIM - Locomotion: Wheelchair Distance: 150 Locomotion: Wheelchair: 1: Total Assistance/staff pushes wheelchair (Pt<25%) FIM - Locomotion: Ambulation Locomotion: Ambulation Assistive Devices: Other (comment), Orthosis, Walker - EHarmon Pier(Eva walker then 3 musketeers assist) Ambulation/Gait Assistance: 1: +2 Total assist Locomotion: Ambulation: 1: Two helpers  Comprehension Comprehension Mode: Auditory Comprehension: 5-Understands complex 90% of the time/Cues < 10% of the time  Expression Expression Mode: Verbal Expression: 5-Expresses complex 90% of the time/cues < 10% of the time  Social Interaction Social Interaction: 6-Interacts appropriately with others with medication or extra time (anti-anxiety, antidepressant).  Problem Solving Problem Solving: 5-Solves complex 90% of the time/cues < 10% of the time  Memory Memory: 7-Complete Independence: No helper  Medical Problem List and Plan:  1. 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 DVT Prophylaxis/Anticoagulation: SQ heparin .Monitor platelet counts and any signs of  DVT 3. Pain Management: tylenol as needed 4. Diabetes Mellitus uncontrolledwith peripheral neuropathy.HGB A1C 7.7.SSI. Controlled on Tradjenta 59m Lantus 10U qhs and  amaryl 29m37maily     5. Neuropsych: This patient is capable of making decisions on his own behalf. 6. Skin/Wound Care: Routine skin checks 7. Fluids/Electrolytes/Nutrition:strict I&O.Follow up labs 8.Hypertension. Lisinopril discontinued 09/16/2014 secondary to mild elevation in creatinine to 1.85..(Patient had also been on Coreg 12.5 mg twice a day prior to admission and lisinopril 40 mg daily) Monitor with increased mobility and resume as needed 9. History throat cancer. Status post dissection with radiation chemotherapy 2011 10.Hypothyroidism.Synthroid 11.Diastolic CHF. Monitor for any signs of fluid overload 12.Hyperlipidemia. Crestor 13.GERD.Protonix  LOS (Days) 12 A FACE TO FACE EVALUATION WAS PERFORMED  Brian Hall E 09/28/2014, 8:43 AM

## 2014-09-29 ENCOUNTER — Encounter (HOSPITAL_COMMUNITY): Payer: 59

## 2014-09-29 ENCOUNTER — Inpatient Hospital Stay (HOSPITAL_COMMUNITY): Payer: 59 | Admitting: Speech Pathology

## 2014-09-29 ENCOUNTER — Inpatient Hospital Stay (HOSPITAL_COMMUNITY): Payer: 59 | Admitting: Rehabilitation

## 2014-09-29 ENCOUNTER — Inpatient Hospital Stay (HOSPITAL_COMMUNITY): Payer: 59 | Admitting: Occupational Therapy

## 2014-09-29 LAB — GLUCOSE, CAPILLARY
Glucose-Capillary: 117 mg/dL — ABNORMAL HIGH (ref 70–99)
Glucose-Capillary: 146 mg/dL — ABNORMAL HIGH (ref 70–99)
Glucose-Capillary: 126 mg/dL — ABNORMAL HIGH (ref 70–99)
Glucose-Capillary: 103 mg/dL — ABNORMAL HIGH (ref 70–99)

## 2014-09-29 NOTE — Progress Notes (Signed)
Occupational Therapy Session Note  Patient Details  Name: Brian Hall MRN: [id number] Date of Birth: Aug 05, 1956  Today's Date: 09/29/2014 OT Individual Time: 1000 - 1100   60 minutes of skilled OT intervention   Short Term Goals: Week 2:  OT Short Term Goal 1 (Week 2): Pt will perform shower transfer with Max A in order to decrease level of assistance for functional transfer. OT Short Term Goal 2 (Week 2): Pt will demonstrate purse lip breathing during ADL session in order to decrease SOB with functional activity. OT Short Term Goal 3 (Week 2): Pt will perform toilet transfer with Mod A in order to decrease level of assist for functional transfers. OT Short Term Goal 4 (Week 2): Pt will perform LB dressing with Mod A utilizing hemiplegic dressing techniques as needed.  Skilled Therapeutic Interventions/Progress Updates:  Upon entering the room, pt seated in wheelchair with 3/10 c/o "sciatic pain" during session. His wife remains present during session for observation on progress. Skilled OT session with focus on self care retraining, functional transfers, L UE NMR, standing balance, and STS. Pt bathing at shower level this session with Max A stand pivot, to the L, from wheelchair > TTB and Mod A squat pivot from TTB>wheelchair. Pt performed bathing tasks with assistance only to wash buttocks. Pt utilizing LH sponge and use of bath mitt this session on L hand for bathing. Pt utilizing R hand to hold L in order to wash with bath mitt. Pt doing a great job incorporating hand into ADL tasks this session. Dressing completed at sink side in wheelchair and standing up within STEDY. Pt standing into stedy from wheelchair with Mod A and from stedy elevated surface with Min A. Pt requiring verbal cues each time STS for safe placement of L UE. Pt seated in recliner chair with QRB and L arm tray donned. Call bell and all other needed items within reach and wife remains present in room.    Therapy  Documentation Precautions:  Precautions Precautions: Fall Precaution Comments: left inattention, labile Restrictions Weight Bearing Restrictions: No ADL: ADL ADL Comments: see FIM  See FIM for current functional status  Therapy/Group: Individual Therapy  [name] 09/29/2014, 8:27 PM

## 2014-09-29 NOTE — Plan of Care (Signed)
Problem: RH Ambulation Goal: LTG Patient will ambulate in controlled environment (PT) LTG: Patient will ambulate in a controlled environment, # of feet with assistance (PT).  Downgraded due to progress.  Pt will likely only ambulate with therapy.  Goal: LTG Patient will ambulate in home environment (PT) LTG: Patient will ambulate in home environment, # of feet with assistance (PT).  Outcome: Not Applicable Date Met:  37/16/96 Do not feel that pt will be ambulatory at time of D/C other than with therapy, therefore will DC home ambulation goal.   Problem: RH Wheelchair Mobility Goal: LTG Patient will propel w/c in home environment (PT) LTG: Patient will propel wheelchair in home environment, # of feet with assistance (PT).  Downgraded distance as pt will likely not propel large amounts in home.   Problem: RH Stairs Goal: LTG Patient will ambulate up and down stairs w/assist (PT) LTG: Patient will ambulate up and down # of stairs with assistance (PT)  Outcome: Not Applicable Date Met:  78/93/81 D/C'd goal as per CSW, family to install ramp for home entry.

## 2014-09-29 NOTE — Progress Notes (Signed)
Physical Therapy Session Note  Patient Details  Name: Brian Hall MRN: [id number] Date of Birth: 1956-07-20  Today's Date: 09/29/2014 PT Individual Time: 1500-1600 PT Individual Time Calculation (min): 60 min   Short Term Goals: Week 2:  PT Short Term Goal 1 (Week 2): pt will perform basic transfer with min assist to R and L. PT Short Term Goal 2 (Week 2): pt will perform gait x 25' with LRAD and assist of 1 person PT Short Term Goal 3 (Week 2): pt will ascend/descend 2 stairs 1 rail max assist PT Short Term Goal 4 (Week 2): pt will move sit> supine with min assist.  Skilled Therapeutic Interventions/Progress Updates:   Pt received sitting in w/c in room, agreeable to therapy session.  Assisted pt to/from therapy gym via w/c at total A level for time management.  Co-treat with RT during session in order to work on sit<>stands, functional transfers, orientation to midline, weight shifts to the R, LLE activation and WB, and gait with use of grocery cart with L reaction AFO (heel wedge added to decrease recurvatum) and UE support on PVC poles to facilitate increased upright posture.  Pt performed squat pivot transfers during session x 3 reps R and L at min A level.  Continues to require min cues for LLE placement and LUE safety during transfers, but does great with forward weight shift and elevating buttocks.  Performed several reps of sit<>stand and dynamic reaching task with RUE placing horse shoes on basketball hoop while stepping forward/backwards with RLE in order to increase weight shift to the R to carryover to standing and gait.  Requires mod to max A for standing (+2 needed for maintaining position of LLE and also to facilitate increased upright posture and trunk rotation to the R with increased L glute activation).  Ended session with trial of gait with use of shopping cart to decrease UE support with UEs on PVC pipes to encourage more upright posture.  Donned surgical shoe cover for  increased clearance of LLE.  Note pt did very well with clearing LLE, L quad control however requires assist for upright posture and forward translation over LLE during L stance.  Pt ambulated 45' x 1 and another 58' x 1 in this manner.  Pt assisted back to w/c and back to room.  Transferred to recliner and left with all needs in reach and quick release belt donned.    Therapy Documentation Precautions:  Precautions Precautions: Fall Precaution Comments: left inattention, labile Restrictions Weight Bearing Restrictions: No  Pain: Pain Assessment Pain Score: 2  PAINAD (Pain Assessment in Advanced Dementia) Breathing: normal Negative Vocalization: none Facial Expression: smiling or inexpressive Body Language: relaxed Consolability: no need to console PAINAD Score: 0   Locomotion : Ambulation Ambulation/Gait Assistance: 1: +2 Total assist   See FIM for current functional status  Therapy/Group: Individual Therapy  Denice Bors 09/29/2014, 8:17 PM

## 2014-09-29 NOTE — Consult Note (Signed)
  NEUROCOGNITIVE STATUS EXAMINATION - [place]   Brian Hall is a 59 year old, right-handed man, who was seen for a brief neurocognitive status examination to evaluate his emotional and cognitive functioning in the setting of stroke.  According to his medical record, he was admitted on 09/12/14 with left-sided weakness and slurred speech.  MRI of the brain demonstrated an acute large territory right MCA infarct involving the right frontal lobe as well as remote infarcts in the medial left occipital lobe and left cerebellar hemisphere.  He did not receive TPA.    Emotional Functioning:  During the clinical interview, Brian Hall was tearful off and on and cited frustration over the burden that this illness and other family members are putting on his wife.  He also stated that the adjustment to not being able to be independent and walk has been extremely challenging.  Finally, he lamented that he is not working and will have to apply for disability at least in the short term.  Still, he denied symptoms of major depression, including suicidal ideation.  In fact, on a self-report instrument designed to assess for depressive symptomatology, he did not endorse any symptoms of depression.  He stated that overall, he is "optimistic" and feels as though this hurdle is "just part of life."  He said that he plans to cope with this situation the same way he has coped with cancer in the past (e.g. by taking one day at a time.).  He commented that he feels like "a computer; [he] just needs to reboot."  Brian Hall stated that he has been able to observe himself making physical progress and he is determined to get at least somewhat better over time.    Mental Status:  Mr.  Hall total score on a very brief measure of mental status was not suggestive of the presence of cognitive disruption at the level of dementia (MMSE-2 brief version = 13/16).  He misstated the date  and also lost two points for failing to freely recall all of 3 previously studied words after a very brief delay.  Subjectively, he denied noticing cognitive changes since his stroke, but he commented that he has never had strength in memory.    Impressions and Recommendations:  Mr.  Hall total score on an overall measure of mental status was not suggestive of marked cognitive disruption, at the level of dementia.  However, if he notices cognitive challenges post-discharge or as he prepares to return to work in the future, a comprehensive neuropsychological evaluation would be beneficial in more clearly defining the nature and extent of any cognitive changes that may have resulted from his stroke.  From an emotional standpoint, Brian Hall seems to be experiencing emotional lability, likely directly related to his stroke, as his emotional presentation is incongruent with his reported experience of emotions.  He was provided with education regarding expectations for the emotional lability going forward and he seemed to understand.  Although he does not appear to have clinical depression, he did report feeling "better" after speaking with the neuropsychologist and therefore, continued follow-up for support while on the unit will be helpful.    DIAGNOSIS:   CVA  Marlane Hatcher, Psy.D.  Clinical Neuropsychologist

## 2014-09-29 NOTE — Progress Notes (Signed)
Recreational Therapy Session Note  Patient Details  Name: Brian Hall MRN: [id number] Date of Birth: 1956-02-19 Today's Date: 09/29/2014  Pain: c/o 4/10 "sciatic pain" Skilled Therapeutic Interventions/Progress Updates: Session focused on activity tolerance, transfers, sit-stands, dynamic standing balance, equal weight distribution in standing, ambulation during co-treat with PT.   Therapy/Group: Co-Treatment Malina Geers 09/29/2014, 4:16 PM

## 2014-09-29 NOTE — Progress Notes (Signed)
Speech Language Pathology Weekly Progress and Session Note  Patient Details  Name: Brian Hall MRN: [id number] Date of Birth: 1956/06/08  Beginning of progress report period: September 23, 2014 End of progress report period: September 29, 2014  Today's Date: 09/29/2014 SLP Individual Time: 0800-0900 SLP Individual Time Calculation (min): 60 min  Short Term Goals: Week 2: SLP Short Term Goal 1 (Week 2): Patient will attend to left of environment and left upper extremity during functional tasks with Min verbal/question cues.    SLP Short Term Goal 1 - Progress (Week 2): Met SLP Short Term Goal 2 (Week 2): Patient will demonsrate moderately complex problem solving during functional tasks with Mod assist. SLP Short Term Goal 2 - Progress (Week 2): Met SLP Short Term Goal 3 (Week 2): Patient utilize external aids to assist with recall of daily information wtih Min verbal cues.    SLP Short Term Goal 3 - Progress (Week 2): Met SLP Short Term Goal 4 (Week 2): Patient will select attention to functional tasks for 5-10 minutes with Min verbal cues for redirection.  SLP Short Term Goal 4 - Progress (Week 2): Met SLP Short Term Goal 5 (Week 2): Patient will demonstrate appropriate turn taking with peers by self-monitoring interruptions with Supervision assist. SLP Short Term Goal 5 - Progress (Week 2): Met    New Short Term Goals: Week 3: SLP Short Term Goal 1 (Week 3): Patient will attend to left of environment and left upper extremity during functional tasks with Supervision verbal/question cues.    SLP Short Term Goal 2 (Week 3): Patient will demonsrate moderately complex problem solving during functional tasks with Min assist. SLP Short Term Goal 3 (Week 3): Patient utilize external aids to assist with recall of daily information wtih Supervision verbal cues.    SLP Short Term Goal 4 (Week 3): Patient will alternate attention to functional tasks for 5-10 minutes with Min verbal cues for  redirection.   Weekly Progress Updates: Patient has made functional gains and has met 5 out of 5 short term goals this reporting period due to improved cognitive-linguistic abilities with no further need to address dysphagia goals.  Currently, patient continues to require assist for left attention to environment and upper extremity, problem solving with familiar tasks, recall of new information, selective attention and appropriate turn taking with peers during group treatment.  Patient and family education is ongoing and patient would benefit from continued skilled SLP intervention to maximize his functional independence prior to discharge home with 24/7 assist.      Intensity: Minumum of 1-2 x/day, 30 to 90 minutes Frequency: 5 out of 7 days Duration/Length of Stay: 10/14/14 Treatment/Interventions: Cognitive remediation/compensation;Cueing hierarchy;Patient/family education;Functional tasks;Internal/external aids;Environmental controls;Speech/Language facilitation   Daily Session  Skilled Therapeutic Interventions:  Skilled treatment session focused on addressing cognition goals.  SLP facilitated session with Min multimodal cues for attention to left upper extremity during bed to chair transfer with Steady.  SLP also facilitated session with moderately complex problem solving task that required patient schedule numerous tasks within a day and Mod verbal cues to identify possible solutions and determine best option.  Continue with current plan of care.    FIM:  Comprehension Comprehension Mode: Auditory Comprehension: 5-Understands complex 90% of the time/Cues < 10% of the time Expression Expression Mode: Verbal Expression: 5-Expresses complex 90% of the time/cues < 10% of the time Social Interaction Social Interaction: 6-Interacts appropriately with others with medication or extra time (anti-anxiety, antidepressant). Problem Solving Problem Solving: 5-Solves  basic problems: With no  assist Memory Memory: 6-More than reasonable amt of time General    Pain Pain Assessment Pain Assessment: No/denies pain Pain Score: 3   Therapy/Group: Individual Therapy  Carmelia Roller., CCC-SLP [phone]  [place] 09/29/2014, 9:38 AM

## 2014-09-30 ENCOUNTER — Encounter (HOSPITAL_COMMUNITY): Payer: 59 | Admitting: Occupational Therapy

## 2014-09-30 ENCOUNTER — Inpatient Hospital Stay (HOSPITAL_COMMUNITY): Payer: 59 | Admitting: Speech Pathology

## 2014-09-30 ENCOUNTER — Inpatient Hospital Stay (HOSPITAL_COMMUNITY): Payer: 59 | Admitting: Rehabilitation

## 2014-09-30 LAB — GLUCOSE, CAPILLARY
Glucose-Capillary: 121 mg/dL — ABNORMAL HIGH (ref 70–99)
Glucose-Capillary: 157 mg/dL — ABNORMAL HIGH (ref 70–99)
Glucose-Capillary: 99 mg/dL (ref 70–99)
Glucose-Capillary: 94 mg/dL (ref 70–99)

## 2014-09-30 NOTE — Progress Notes (Signed)
Physical Therapy Session Note  Patient Details  Name: Brian Hall MRN: [id number] Date of Birth: 25-Nov-1955  Today's Date: 09/30/2014 PT Individual Time: 1400-1500 PT Individual Time Calculation (min): 60 min   Short Term Goals: Week 2:  PT Short Term Goal 1 (Week 2): pt will perform basic transfer with min assist to R and L. PT Short Term Goal 2 (Week 2): pt will perform gait x 25' with LRAD and assist of 1 person PT Short Term Goal 3 (Week 2): pt will ascend/descend 2 stairs 1 rail max assist PT Short Term Goal 4 (Week 2): pt will move sit> supine with min assist.  Skilled Therapeutic Interventions/Progress Updates:   Pt received sitting in w/c in room, wife present in room.  PT had long discussion with wife and pt regarding D/C plans and needing measurements, knowing whether or not they are going to get a stedy for use at home, building a ramp and thinking about other home modifications as well as doing a home evaluation.  Pts wife states that they are still looking online to determine if they are going to be able to afford stedy.  She has already thought about removing doors and even door jams if necessary.  We also talked about shower set up and would he be able to use shower stall with tub transfer bench.  Suggested that wife begin to take measurements and pictures so that we can plan.  I discussed that I do not want to wait until its too late to begin training or do a home evaluation.  Pt and wife verbalized understanding.  Pt self propelled to therapy gym x 150' at S level using R hemi technique.  Pt did better today avoiding obstacles on the L, however did need min cues intermittently to scan L.  Once in therapy gym, transferred to therapy mat at min A level with cues for LLE safety and set up.  Remainder of session focused on gait for upright posture, weight shifting to the R, WB through LLE, advancing LLE, and L glute/quad activation.  Performed single rep with therapist under L  axilla and R UE support on tech's shoulder to facilitate upright posture, second and third rep with use of Eva walker and last rep with +2 assist with therapist under L axilla and tech holding R hand for UE support.  Requires +2 assist (pt assist 30-40%).  Pt assisted back to w/c and left in w/c with quick release belt donned and all needs in reach.    Therapy Documentation Precautions:  Precautions Precautions: Fall Precaution Comments: left inattention, labile Restrictions Weight Bearing Restrictions: No   Vital Signs: Therapy Vitals Temp: 98.5 F (36.9 C) Temp Source: Oral Pulse Rate: (!) 55 Resp: 17 BP: 140/84 mmHg Patient Position (if appropriate): Sitting Oxygen Therapy SpO2: 99 % O2 Device: Not Delivered Pain: Pt with no c/o pain during session.    Locomotion : Ambulation Ambulation/Gait Assistance: 1: +2 Total assist Wheelchair Mobility Distance: 150   See FIM for current functional status  Therapy/Group: Individual Therapy  Denice Bors 09/30/2014, 4:50 PM

## 2014-09-30 NOTE — Progress Notes (Signed)
Speech Language Pathology Daily Session Note  Patient Details  Name: Brian Hall MRN: [id number] Date of Birth: 1956/08/13  Today's Date: 09/30/2014 SLP Individual Time: 0900-1000 SLP Individual Time Calculation (min): 60 min  Short Term Goals: Week 3: SLP Short Term Goal 1 (Week 3): Patient will attend to left of environment and left upper extremity during functional tasks with Supervision verbal/question cues.    SLP Short Term Goal 2 (Week 3): Patient will demonsrate moderately complex problem solving during functional tasks with Min assist. SLP Short Term Goal 3 (Week 3): Patient utilize external aids to assist with recall of daily information wtih Supervision verbal cues.    SLP Short Term Goal 4 (Week 3): Patient will alternate attention to functional tasks for 5-10 minutes with Min verbal cues for redirection.   Skilled Therapeutic Interventions: Skilled treatment session focused on addressing cognition goals.  SLP facilitated session with Min multimodal cues for attention to left upper extremity during a moderately complex problem solving task that required patient to alternate attention between a shopping list and a catalog.  Patient also required Mod multimodal cues to alternate attnetion during task for 30 minutes.  SLP also facilitated session with Min question cues to self-monitor and correct errors.  Continue with current plan of care.   FIM:  Comprehension Comprehension Mode: Auditory Comprehension: 5-Understands complex 90% of the time/Cues < 10% of the time Expression Expression Mode: Verbal Expression: 5-Expresses complex 90% of the time/cues < 10% of the time Social Interaction Social Interaction: 6-Interacts appropriately with others with medication or extra time (anti-anxiety, antidepressant). Problem Solving Problem Solving: 5-Solves basic problems: With no assist Memory Memory: 6-More than reasonable amt of time  Pain Pain Assessment Pain Assessment:  No/denies pain  Therapy/Group: Individual Therapy  Carmelia Roller., CCC-SLP [phone]  [place] 09/30/2014, 9:54 AM

## 2014-09-30 NOTE — Progress Notes (Signed)
Occupational Therapy Weekly Progress Note  Patient Details  Name: Brian Hall MRN: [id number] Date of Birth: Jun 24, 1956  Beginning of progress report period: September 23, 2014 End of progress report period: September 30, 2014  Today's Date: 09/30/2014 OT Individual [id number]  - 1058   60 minutes of skilled OT intervention   Patient has met 2 of 4 short term goals. Pt is making steady progress towards goals this week with is wife continuing to be present during sessions for education and observation. Home health evaluation has been discussed with pt and family member in order to ensure safe discharge home based on level of assist needed. Caregiver also discussing wanting to purchase STEDY for use at home. Pt has made great progress towards demonstrating pursed lip breathing during self care sessions independently once appearing to be SOB. Pt able to transfer to L with Max A from wheelchair >TTB and Mod A back into wheelchair. Pt is highly motivated and continued to benefit from OT services.   Patient continues to demonstrate the following deficits: decreased balance, decreased functional mobility, decreased functional transfers, decreased self care independence, mild L inattention, L UE/LE decreased strength and ROM and therefore will continue to benefit from skilled OT intervention to enhance overall performance with BADL.  Patient progressing toward long term goals..  Continue plan of care.  OT Short Term Goals Week 3:  OT Short Term Goal 1 (Week 3): Pt will perform STS and maintain balance with Mod A while therapist assist with clothing management for LB dressing.  OT Short Term Goal 2 (Week 3): Pt will perform toilet transfer from wheelchair to toilet/BSC with Mod A. OT Short Term Goal 3 (Week 3): Pt will demonstrate safety awareness by managing L UE during functional transfers and self care tasks independently in order to increase safety. OT Short Term Goal 4 (Week 3): Pt will  demonstrate lateral lean in shower to wash buttocks with steady assist while on TTB.   Skilled Therapeutic Interventions/Progress Updates:  Upon entering the room, pt seated in recliner chair awaiting therapist. Pt with no c/o pain this session. Pt transferring onto TTB with use of STEDY this session for time management. OT assisted pt in donning of bath mitt onto L hand and require min verbal cues to utilize in bathing of face, R arm, chest, and abdomen. Pt requiring assistance to wash buttocks. Dressing performed seated at sink side with use of STEDY for STS during assist with clothing management. Pt standing from wheelchair and from elevated seat with Min A into STEDY. Pt seated in wheelchair with QRB and L arm tray donned. Pt very emotional during session and crying several times over discussion of progress towards goals and when given encouragement. Call bell and all other needed items within reach.   Therapy Documentation Precautions:  Precautions Precautions: Fall Precaution Comments: left inattention, labile Restrictions Weight Bearing Restrictions: No ADL: ADL ADL Comments: see FIM  See FIM for current functional status  Therapy/Group: Individual Therapy  [name] 09/30/2014, 8:40 PM

## 2014-09-30 NOTE — Progress Notes (Signed)
Social Work Patient ID: Brian Hall, male   DOB: Mar 21, 1956, 59 y.o.   MRN: [id number] Met with wife to give her information for disability application.  She is deciding between doing an on-line application versus paper application.  She also has talked with family members and friends who have gone through an attorney to assist with getting his disability approved. Gave them the handicapped form wife to follow up with [facility].  Work toward discharge 2/12.

## 2014-09-30 NOTE — Progress Notes (Signed)
Subjective/Complaints: Patient slept okay last night. Patient does not want to take any medicines for his emotional lability, no bowel issues today Review of systems is significant for left-sided weakness.  Objective: Vital Signs: Blood pressure 134/71, pulse 57, temperature 98 F (36.7 C), temperature source Oral, resp. rate 18, weight 108.6 kg (239 lb 6.7 oz), SpO2 97 %. No results found. Results for orders placed or performed during the hospital encounter of 09/16/14 (from the past 72 hour(s))  Glucose, capillary     Status: Abnormal   Collection Time: 09/27/14 12:08 PM  Result Value Ref Range   Glucose-Capillary 169 (H) 70 - 99 mg/dL   Comment 1 Notify RN   Glucose, capillary     Status: Abnormal   Collection Time: 09/27/14  5:33 PM  Result Value Ref Range   Glucose-Capillary 107 (H) 70 - 99 mg/dL  Glucose, capillary     Status: Abnormal   Collection Time: 09/27/14  9:02 PM  Result Value Ref Range   Glucose-Capillary 150 (H) 70 - 99 mg/dL  Glucose, capillary     Status: Abnormal   Collection Time: 09/28/14  7:10 AM  Result Value Ref Range   Glucose-Capillary 132 (H) 70 - 99 mg/dL  Glucose, capillary     Status: Abnormal   Collection Time: 09/28/14 11:52 AM  Result Value Ref Range   Glucose-Capillary 164 (H) 70 - 99 mg/dL  Glucose, capillary     Status: Abnormal   Collection Time: 09/28/14  4:31 PM  Result Value Ref Range   Glucose-Capillary 103 (H) 70 - 99 mg/dL  Glucose, capillary     Status: Abnormal   Collection Time: 09/28/14  9:04 PM  Result Value Ref Range   Glucose-Capillary 119 (H) 70 - 99 mg/dL  Glucose, capillary     Status: Abnormal   Collection Time: 09/29/14  7:09 AM  Result Value Ref Range   Glucose-Capillary 117 (H) 70 - 99 mg/dL   Comment 1 Notify RN   Glucose, capillary     Status: Abnormal   Collection Time: 09/29/14 11:10 AM  Result Value Ref Range   Glucose-Capillary 146 (H) 70 - 99 mg/dL   Comment 1 Notify RN   Glucose, capillary      Status: Abnormal   Collection Time: 09/29/14  4:51 PM  Result Value Ref Range   Glucose-Capillary 126 (H) 70 - 99 mg/dL  Glucose, capillary     Status: Abnormal   Collection Time: 09/29/14  8:38 PM  Result Value Ref Range   Glucose-Capillary 103 (H) 70 - 99 mg/dL  Glucose, capillary     Status: Abnormal   Collection Time: 09/30/14  7:06 AM  Result Value Ref Range   Glucose-Capillary 121 (H) 70 - 99 mg/dL   Comment 1 Notify RN      HEENT: normal Cardio: RRR and no murmurs Resp: CTA B/L and unlabored GI: BS positive and nontender nondistended Extremity:  No Edema Skin:   Intact Neuro: Alert/Oriented, Cranial Nerve Abnormalities left central 7, Abnormal Sensory decreased sensation to light touch left upper and left lower extremity, Abnormal Motor 2/5 Left shoulder Add, o/w  Trace in R UE, 3 minus/5 in hip knee extensor synergy left lower extremity 0/5 at the ankle and Inattention Musc/Skel:  Other no pain with left shoulder or left lower extremity range of motion Gen.no acute distress Mood and affect appropriate  Assessment/Plan: 1. Functional deficits secondary to right MCA infarct which require 3+ hours per day of interdisciplinary therapy in  a comprehensive inpatient rehab setting. Physiatrist is providing close team supervision and 24 hour management of active medical problems listed below. Physiatrist and rehab team continue to assess barriers to discharge/monitor patient progress toward functional and medical goals.  FIM: FIM - Bathing Bathing Steps Patient Completed: Chest, Right Arm, Left Arm, Abdomen, Front perineal area, Right upper leg, Left upper leg, Right lower leg (including foot), Left lower leg (including foot) Bathing: 4: Min-Patient completes 8-9 67f 10 parts or 75+ percent  FIM - Upper Body Dressing/Undressing Upper body dressing/undressing steps patient completed: Pull shirt over trunk, Thread/unthread left sleeve of pullover shirt/dress, Put head through opening  of pull over shirt/dress, Thread/unthread right sleeve of pullover shirt/dresss Upper body dressing/undressing: 5: Set-up assist to: Obtain clothing/put away FIM - Lower Body Dressing/Undressing Lower body dressing/undressing steps patient completed: Thread/unthread right pants leg, Don/Doff right shoe Lower body dressing/undressing: 2: Max-Patient completed 25-49% of tasks  FIM - Toileting Toileting: 0: Activity did not occur  FIM - Air cabin crew Transfers: 0-Activity did not occur  FIM - Control and instrumentation engineer Devices: Arm rests Bed/Chair Transfer: 3: Bed > Chair or W/C: Mod A (lift or lower assist), 3: Chair or W/C > Bed: Mod A (lift or lower assist) (w/c<>therapy mat)  FIM - Locomotion: Wheelchair Distance: 150 Locomotion: Wheelchair: 5: Travels 150 ft or more: maneuvers on rugs and over door sills with supervision, cueing or coaxing FIM - Locomotion: Ambulation Locomotion: Ambulation Assistive Devices: Other (comment) (shopping cart) Ambulation/Gait Assistance: 1: +2 Total assist Locomotion: Ambulation: 1: Two helpers  Comprehension Comprehension Mode: Auditory Comprehension: 5-Understands complex 90% of the time/Cues < 10% of the time  Expression Expression Mode: Verbal Expression: 5-Expresses complex 90% of the time/cues < 10% of the time  Social Interaction Social Interaction: 6-Interacts appropriately with others with medication or extra time (anti-anxiety, antidepressant).  Problem Solving Problem Solving Mode: Asleep Problem Solving: 5-Solves basic problems: With no assist  Memory Memory: 6-More than reasonable amt of time  Medical Problem List and Plan:  1. 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 DVT Prophylaxis/Anticoagulation: SQ heparin .Monitor platelet counts and any signs of DVT 3. Pain Management: tylenol as needed 4. Diabetes Mellitus uncontrolledwith peripheral  neuropathy.HGB A1C 7.7.SSI. Controlled on Tradjenta 5mg , Lantus 10U qhs and  amaryl 2mg  daily     5. Neuropsych: This patient is capable of making decisions on his own behalf. 6. Skin/Wound Care: Routine skin checks 7. Fluids/Electrolytes/Nutrition:strict I&O.Follow up labs 8.Hypertension. Lisinopril discontinued 09/16/2014 secondary to mild elevation in creatinine to 1.85..(Patient had also been on Coreg 12.5 mg twice a day prior to admission and lisinopril 40 mg daily) Monitor with increased mobility and resume as needed 9. History throat cancer. Status post dissection with radiation chemotherapy 2011 10.Hypothyroidism.Synthroid 11.Diastolic CHF. Monitor for any signs of fluid overload 12.Hyperlipidemia. Crestor 13.GERD.Protonix  LOS (Days) 14 A FACE TO FACE EVALUATION WAS PERFORMED  KIRSTEINS,ANDREW E 09/30/2014, 8:36 AM

## 2014-10-01 ENCOUNTER — Inpatient Hospital Stay (HOSPITAL_COMMUNITY): Payer: 59 | Admitting: Occupational Therapy

## 2014-10-01 LAB — GLUCOSE, CAPILLARY
GLUCOSE-CAPILLARY: 106 mg/dL — AB (ref 70–99)
Glucose-Capillary: 103 mg/dL — ABNORMAL HIGH (ref 70–99)
Glucose-Capillary: 120 mg/dL — ABNORMAL HIGH (ref 70–99)
Glucose-Capillary: 128 mg/dL — ABNORMAL HIGH (ref 70–99)

## 2014-10-01 MED ORDER — CARVEDILOL 12.5 MG PO TABS
12.5000 mg | ORAL_TABLET | Freq: Two times a day (BID) | ORAL | Status: DC
Start: 2014-10-01 — End: 2014-10-14
  Administered 2014-10-01 – 2014-10-14 (×23): 12.5 mg via ORAL
  Filled 2014-10-01 (×28): qty 1

## 2014-10-01 MED ORDER — LISINOPRIL 40 MG PO TABS
40.0000 mg | ORAL_TABLET | Freq: Every day | ORAL | Status: DC
  Administered 2014-10-02 – 2014-10-14 (×13): 40 mg via ORAL
  Filled 2014-10-01 (×14): qty 1

## 2014-10-01 NOTE — Progress Notes (Signed)
Spoke with Dr. Agustin Cree at 18:27 regarding patient's high blood pressure readings. Home BP meds resumed.

## 2014-10-01 NOTE — Progress Notes (Signed)
Patient ID: Brian Hall, male   DOB: 09-18-1955, 59 y.o.   MRN: [id number]  10/01/14.   59 y/o admit for CIR with  functional deficits secondary to right MCA infarct  Subjective/Complaints: Patient slept okay last night. Alert; feels well without complaints  Review of systems is significant for left-sided weakness.  Past Medical History  Diagnosis Date  . Hypertension   . Diabetes mellitus   . Obesity   . CHF (congestive heart failure)   . SOB (shortness of breath)   . Hyperkalemia   . Renal insufficiency   . Cardiogenic shock   . Cardiac LV ejection fraction 10-20%   . Hyperlipemia   . Hypothyroidism   . Throat cancer 2011    s/p neck dissection, radiation, chemo    Objective: Vital Signs: Blood pressure 134/82, pulse 57, temperature 98 F (36.7 C), temperature source Oral, resp. rate 18, weight 108.6 kg (239 lb 6.7 oz), SpO2 96 %. No results found. Results for orders placed or performed during the hospital encounter of 09/16/14 (from the past 72 hour(s))  Glucose, capillary     Status: Abnormal   Collection Time: 09/28/14 11:52 AM  Result Value Ref Range   Glucose-Capillary 164 (H) 70 - 99 mg/dL  Glucose, capillary     Status: Abnormal   Collection Time: 09/28/14  4:31 PM  Result Value Ref Range   Glucose-Capillary 103 (H) 70 - 99 mg/dL  Glucose, capillary     Status: Abnormal   Collection Time: 09/28/14  9:04 PM  Result Value Ref Range   Glucose-Capillary 119 (H) 70 - 99 mg/dL  Glucose, capillary     Status: Abnormal   Collection Time: 09/29/14  7:09 AM  Result Value Ref Range   Glucose-Capillary 117 (H) 70 - 99 mg/dL   Comment 1 Notify RN   Glucose, capillary     Status: Abnormal   Collection Time: 09/29/14 11:10 AM  Result Value Ref Range   Glucose-Capillary 146 (H) 70 - 99 mg/dL   Comment 1 Notify RN   Glucose, capillary     Status: Abnormal   Collection Time: 09/29/14  4:51 PM  Result Value Ref Range   Glucose-Capillary 126 (H) 70 - 99 mg/dL   Glucose, capillary     Status: Abnormal   Collection Time: 09/29/14  8:38 PM  Result Value Ref Range   Glucose-Capillary 103 (H) 70 - 99 mg/dL  Glucose, capillary     Status: Abnormal   Collection Time: 09/30/14  7:06 AM  Result Value Ref Range   Glucose-Capillary 121 (H) 70 - 99 mg/dL   Comment 1 Notify RN   Glucose, capillary     Status: Abnormal   Collection Time: 09/30/14 11:15 AM  Result Value Ref Range   Glucose-Capillary 157 (H) 70 - 99 mg/dL   Comment 1 Notify RN   Glucose, capillary     Status: None   Collection Time: 09/30/14  4:19 PM  Result Value Ref Range   Glucose-Capillary 99 70 - 99 mg/dL  Glucose, capillary     Status: None   Collection Time: 09/30/14  9:26 PM  Result Value Ref Range   Glucose-Capillary 94 70 - 99 mg/dL  Glucose, capillary     Status: Abnormal   Collection Time: 10/01/14  7:00 AM  Result Value Ref Range   Glucose-Capillary 103 (H) 70 - 99 mg/dL    Patient Vitals for the past 24 hrs:  BP Temp Temp src Pulse Resp SpO2  10/01/14  0520 134/82 mmHg 98 F (36.7 C) Oral (!) 57 18 96 %  09/30/14 1514 140/84 mmHg 98.5 F (36.9 C) Oral (!) 55 17 99 %     Intake/Output Summary (Last 24 hours) at 10/01/14 0913 Last data filed at 10/01/14 0700  Gross per 24 hour  Intake    600 ml  Output    400 ml  Net    200 ml   Lab Results  Component Value Date   HGBA1C 7.7* 09/12/2014    HEENT: normal Cardio: RRR and no murmurs Resp: CTA B/L and unlabored GI: BS positive and nontender nondistended Extremity:  No Edema Skin:   Intact Neuro: Alert/Oriented, Cranial Nerve Abnormalities left central 7, Abnormal Sensory decreased sensation to light touch left upper and left lower extremity, Abnormal Motor 2/5 Left shoulder Add, o/w  Trace in R UE, 3 minus/5 in hip knee extensor synergy left lower extremity 0/5 at the ankle and Inattention Musc/Skel:  Other no pain with left shoulder or left lower extremity range of motion Gen.no acute distress Mood and  affect appropriate   Medical Problem List and Plan:  1. 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 DVT Prophylaxis/Anticoagulation: SQ heparin .Monitor platelet counts and any signs of DVT 3. Pain Management: tylenol as needed 4. Diabetes Mellitus uncontrolledwith peripheral neuropathy.HGB A1C 7.7.SSI. Controlled on Tradjenta 5mg , Lantus 10U qhs and  amaryl 2mg  daily     6.Hypertension. Lisinopril discontinued 09/16/2014 secondary to mild elevation in creatinine to 1.85..(Patient had also been on Coreg 12.5 mg twice a day prior to admission and lisinopril 40 mg daily) Monitor with increased mobility and resume as needed 7. History throat cancer. Status post dissection with radiation chemotherapy 2011 8.Hypothyroidism.Synthroid 9. Diastolic CHF. Monitor for any signs of fluid overload 10. Hyperlipidemia. Crestor   LOS (Days) 15 A FACE TO FACE EVALUATION WAS PERFORMED  Nyoka Cowden 10/01/2014, 9:10 AM

## 2014-10-01 NOTE — Progress Notes (Signed)
Occupational Therapy Session Note  Patient Details  Name: Brian Hall MRN: [id number] Date of Birth: 1956-03-02  Today's Date: 10/01/2014 OT Individual Time: 1330-1430 OT Individual Time Calculation (min): 60 min    Short Term Goals: Week 3:  OT Short Term Goal 1 (Week 3): Pt will perform STS and maintain balance with Mod A while therapist assist with clothing management for LB dressing.  OT Short Term Goal 2 (Week 3): Pt will perform toilet transfer from wheelchair to toilet/BSC with Mod A. OT Short Term Goal 3 (Week 3): Pt will demonstrate safety awareness by managing L UE during functional transfers and self care tasks independently in order to increase safety. OT Short Term Goal 4 (Week 3): Pt will demonstrate lateral lean in shower to wash buttocks with steady assist while on TTB.   Skilled Therapeutic Interventions/Progress Updates:  Upon entering the room, pt seated in wheelchair awaiting therapist arrival. Pt with no c/o pain this session. OT propelled pt into bathroom for set up of tub transfer. Pt transfer squat pivot into shower with Max A and out of shower with mod A. Shower transfer onto TTB in walk in shower with use of grab bar. Bathing performed with use of bath mit and LH sponge. Bath mit donned by OT onto L hand and pt utilized to wash face, chest, abdomen, and part of R UE. Pt requiring assist to wash buttocks. Grooming performed at sink side while seated in wheelchair supervision. Pt utilizing teeth to open cap of toothpaste. Pt standing inside of STEDY with Mod A to stand from wheelchair and Min A to maintain balance once standing during clothing management. Pt seated in wheelchair with L arm tray donned and call bell within reach.   Therapy Documentation Precautions:  Precautions Precautions: Fall Precaution Comments: left inattention, labile Restrictions Weight Bearing Restrictions: No  ADL ADL Comments: see FIM  See FIM for current functional  status  Therapy/Group: Individual Therapy  [name] 10/01/2014, 3:48 PM

## 2014-10-02 ENCOUNTER — Inpatient Hospital Stay (HOSPITAL_COMMUNITY): Payer: 59 | Admitting: Physical Therapy

## 2014-10-02 LAB — GLUCOSE, CAPILLARY
GLUCOSE-CAPILLARY: 151 mg/dL — AB (ref 70–99)
GLUCOSE-CAPILLARY: 171 mg/dL — AB (ref 70–99)
Glucose-Capillary: 113 mg/dL — ABNORMAL HIGH (ref 70–99)
Glucose-Capillary: 116 mg/dL — ABNORMAL HIGH (ref 70–99)

## 2014-10-02 NOTE — Progress Notes (Signed)
Patient ID: Brian Hall, male   DOB: 06-14-1956, 59 y.o.   MRN: [id number]   Patient ID: Brian Hall, male   DOB: 25-May-1956, 59 y.o.   MRN: [id number]  10/02/14.   59 y/o admit for CIR with  functional deficits secondary to right MCA infarct  Subjective/Complaints:  Alert; feels well without complaints  Review of systems is significant for left-sided weakness.  Past Medical History  Diagnosis Date  . Hypertension   . Diabetes mellitus   . Obesity   . CHF (congestive heart failure)   . SOB (shortness of breath)   . Hyperkalemia   . Renal insufficiency   . Cardiogenic shock   . Cardiac LV ejection fraction 10-20%   . Hyperlipemia   . Hypothyroidism   . Throat cancer 2011    s/p neck dissection, radiation, chemo    Objective: Vital Signs: Blood pressure 127/74, pulse 50, temperature 97.4 F (36.3 C), temperature source Oral, resp. rate 18, weight 108.6 kg (239 lb 6.7 oz), SpO2 98 %. No results found. Results for orders placed or performed during the hospital encounter of 09/16/14 (from the past 72 hour(s))  Glucose, capillary     Status: Abnormal   Collection Time: 09/29/14 11:10 AM  Result Value Ref Range   Glucose-Capillary 146 (H) 70 - 99 mg/dL   Comment 1 Notify RN   Glucose, capillary     Status: Abnormal   Collection Time: 09/29/14  4:51 PM  Result Value Ref Range   Glucose-Capillary 126 (H) 70 - 99 mg/dL  Glucose, capillary     Status: Abnormal   Collection Time: 09/29/14  8:38 PM  Result Value Ref Range   Glucose-Capillary 103 (H) 70 - 99 mg/dL  Glucose, capillary     Status: Abnormal   Collection Time: 09/30/14  7:06 AM  Result Value Ref Range   Glucose-Capillary 121 (H) 70 - 99 mg/dL   Comment 1 Notify RN   Glucose, capillary     Status: Abnormal   Collection Time: 09/30/14 11:15 AM  Result Value Ref Range   Glucose-Capillary 157 (H) 70 - 99 mg/dL   Comment 1 Notify RN   Glucose, capillary     Status: None   Collection Time: 09/30/14  4:19  PM  Result Value Ref Range   Glucose-Capillary 99 70 - 99 mg/dL  Glucose, capillary     Status: None   Collection Time: 09/30/14  9:26 PM  Result Value Ref Range   Glucose-Capillary 94 70 - 99 mg/dL  Glucose, capillary     Status: Abnormal   Collection Time: 10/01/14  7:00 AM  Result Value Ref Range   Glucose-Capillary 103 (H) 70 - 99 mg/dL  Glucose, capillary     Status: Abnormal   Collection Time: 10/01/14 11:47 AM  Result Value Ref Range   Glucose-Capillary 106 (H) 70 - 99 mg/dL  Glucose, capillary     Status: Abnormal   Collection Time: 10/01/14  4:45 PM  Result Value Ref Range   Glucose-Capillary 120 (H) 70 - 99 mg/dL  Glucose, capillary     Status: Abnormal   Collection Time: 10/01/14  9:33 PM  Result Value Ref Range   Glucose-Capillary 128 (H) 70 - 99 mg/dL   Comment 1 Notify RN   Glucose, capillary     Status: Abnormal   Collection Time: 10/02/14  6:58 AM  Result Value Ref Range   Glucose-Capillary 113 (H) 70 - 99 mg/dL   Comment 1 Notify RN  Patient Vitals for the past 24 hrs:  BP Temp Temp src Pulse Resp SpO2  10/02/14 0532 127/74 mmHg 97.4 F (36.3 C) Oral (!) 50 18 98 %  10/01/14 2121 (!) 160/98 mmHg - - 76 - -  10/01/14 1822 (!) 122/93 mmHg - - 79 - -  10/01/14 1700 (!) 140/92 mmHg - - 87 - -  10/01/14 1539 (!) 146/92 mmHg 98.5 F (36.9 C) Oral 93 17 97 %     Intake/Output Summary (Last 24 hours) at 10/02/14 0824 Last data filed at 10/02/14 0403  Gross per 24 hour  Intake    510 ml  Output   1175 ml  Net   -665 ml   Lab Results  Component Value Date   HGBA1C 7.7* 09/12/2014    HEENT: normal Cardio: RRR and no murmurs Resp: CTA B/L and unlabored GI: BS positive and nontender nondistended Extremity:  No Edema Skin:   Intact Neuro: Alert/Oriented, Cranial Nerve Abnormalities left central 7, Abnormal Sensory decreased sensation to light touch left upper and left lower extremity, Abnormal Motor 2/5 Left shoulder Add, o/w  Trace in R UE, 3 minus/5  in hip knee extensor synergy left lower extremity 0/5 at the ankle and Inattention Musc/Skel:  Other no pain with left shoulder or left lower extremity range of motion Gen.no acute distress Mood and affect appropriate   Medical Problem List and Plan:  1. 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 DVT Prophylaxis/Anticoagulation: SQ heparin .Monitor platelet counts and any signs of DVT 3. Pain Management: tylenol as needed 4. Diabetes Mellitus uncontrolledwith peripheral neuropathy.HGB A1C 7.7.SSI. Controlled on Tradjenta 5mg , Lantus 10U qhs and  amaryl 2mg  daily.  Continues to have nice glycemic control     6.Hypertension. Lisinopril discontinued 09/16/2014 secondary to mild elevation in creatinine to 1.85..(Patient had also been on Coreg 12.5 mg twice a day prior to admission and lisinopril 40 mg daily) Monitor with increased mobility and resume as needed.  BP up 10/01/14 but normal this am.  7. History throat cancer. Status post dissection with radiation chemotherapy 2011 8.Hypothyroidism.Synthroid 9. Diastolic CHF. Monitor for any signs of fluid overload 10. Hyperlipidemia. Crestor   LOS (Days) 16 A FACE TO FACE EVALUATION WAS PERFORMED  Nyoka Cowden 10/02/2014, 8:24 AM

## 2014-10-02 NOTE — Progress Notes (Signed)
Occupational Therapy Session Note  Patient Details  Name: Brian Hall MRN: [id number] Date of Birth: 03/13/1956  Today's Date: 10/02/2014 OT Individual Time:  -   1330-1415  (45 min)      Short Term Goals: Week 1:  OT Short Term Goal 1 (Week 1): Pt will transfer to BSC/toilet with mod A  OT Short Term Goal 1 - Progress (Week 1): Progressing toward goal OT Short Term Goal 2 (Week 1): Pt will don shirt with mod A with instructional cues OT Short Term Goal 2 - Progress (Week 1): Met OT Short Term Goal 3 (Week 1): Pt will perform sit to stand for clothing mangement with min  A OT Short Term Goal 3 - Progress (Week 1): Progressing toward goal OT Short Term Goal 4 (Week 1): Pt will attend to left visual field durning ADL (60min session) with mod cuing  OT Short Term Goal 4 - Progress (Week 1): Met Week 2:  OT Short Term Goal 1 (Week 2): Pt will perform shower transfer with Max A in order to decrease level of assistance for functional transfer. OT Short Term Goal 1 - Progress (Week 2): Met OT Short Term Goal 2 (Week 2): Pt will demonstrate purse lip breathing during ADL session in order to decrease SOB with functional activity. OT Short Term Goal 2 - Progress (Week 2): Met OT Short Term Goal 3 (Week 2): Pt will perform toilet transfer with Mod A in order to decrease level of assist for functional transfers. OT Short Term Goal 3 - Progress (Week 2): Progressing toward goal OT Short Term Goal 4 (Week 2): Pt will perform LB dressing with Mod A utilizing hemiplegic dressing techniques as needed. OT Short Term Goal 4 - Progress (Week 2): Progressing toward goal Week 3:  OT Short Term Goal 1 (Week 3): Pt will perform STS and maintain balance with Mod A while therapist assist with clothing management for LB dressing.  OT Short Term Goal 2 (Week 3): Pt will perform toilet transfer from wheelchair to toilet/BSC with Mod A. OT Short Term Goal 3 (Week 3): Pt will demonstrate safety awareness by  managing L UE during functional transfers and self care tasks independently in order to increase safety. OT Short Term Goal 4 (Week 3): Pt will demonstrate lateral lean in shower to wash buttocks with steady assist while on TTB.   Skilled Therapeutic Interventions/Progress Updates:    OT addressed basic addressed functional transfers,  standing balance,  left upper extremity therapeutic activities in functional task.  OT provided tactile and gestural cues for patient to weight bear through LUE using stool, mat and then bench.  Pt. went from sit to half stand with mod assist for weight bear on  LUE x 5 for 10 seconds.  Provides verbal and  Gestural cues to go from sit to stand with better midline control and elbow in almost full extension.  Pt. Tolerated session well.  He was very upbeat and talkative today.     Therapy Documentation Precautions:  Precautions Precautions: Fall Precaution Comments: left inattention, labile Restrictions Weight Bearing Restrictions: No   Pain: Pain Assessment Pain Score: 5 sciatic nerve pain  ADL: ADL ADL Comments: see FIM     See FIM for current functional status  Therapy/Group: Individual Therapy  ,  J 10/02/2014, 12:54 PM  

## 2014-10-03 ENCOUNTER — Inpatient Hospital Stay (HOSPITAL_COMMUNITY): Payer: 59 | Admitting: Speech Pathology

## 2014-10-03 ENCOUNTER — Inpatient Hospital Stay (HOSPITAL_COMMUNITY): Payer: 59 | Admitting: Rehabilitation

## 2014-10-03 ENCOUNTER — Inpatient Hospital Stay (HOSPITAL_COMMUNITY): Payer: 59 | Admitting: Occupational Therapy

## 2014-10-03 LAB — GLUCOSE, CAPILLARY
GLUCOSE-CAPILLARY: 82 mg/dL (ref 70–99)
GLUCOSE-CAPILLARY: 62 mg/dL — AB (ref 70–99)
Glucose-Capillary: 65 mg/dL — ABNORMAL LOW (ref 70–99)
Glucose-Capillary: 91 mg/dL (ref 70–99)
Glucose-Capillary: 97 mg/dL (ref 70–99)
Glucose-Capillary: 63 mg/dL — ABNORMAL LOW (ref 70–99)
Glucose-Capillary: 96 mg/dL (ref 70–99)

## 2014-10-03 NOTE — Progress Notes (Signed)
Physical Therapy Weekly Progress Note  Patient Details  Name: Brian Hall MRN: [id number] Date of Birth: [birth year]  Beginning of progress report period: September 26, 2013 End of progress report period: October 03, 2013  Today's Date: 10/03/2014 PT Individual Time: 1345-1500 PT Individual Time Calculation (min): 75 min   Patient has met 2 of 4 short term goals.  Pt making steady progress with all aspects of mobility.  He continues to be severely limited by decreased trunk control and tendency to push to the L when performing dynamic standing tasks and with gait.  Note that he is able to perform squat pivot transfer with light min A to close S level with cues for set up and LLE management.  Have began to speak with wife regarding D/C plans, whether or not they are getting a Stedy and doing a home evaluation at the end of this week.  Will continue to assess and provide hands on education with wife.    Patient continues to demonstrate the following deficits: decreased trunk control, decreased balance, perceptual deficits, decreased functional use of LLE/UE, decreased awareness, decreased safety and therefore will continue to benefit from skilled PT intervention to enhance overall performance with activity tolerance, balance, postural control, ability to compensate for deficits, functional use of  left upper extremity and left lower extremity, attention, awareness, coordination and knowledge of precautions.  Patient progressing toward long term goals..  Continue plan of care.  PT Short Term Goals Week 2:  PT Short Term Goal 1 (Week 2): pt will perform basic transfer with min assist to R and L. PT Short Term Goal 1 - Progress (Week 2): Met PT Short Term Goal 2 (Week 2): pt will perform gait x 25' with LRAD and assist of 1 person PT Short Term Goal 2 - Progress (Week 2): Progressing toward goal PT Short Term Goal 3 (Week 2): pt will ascend/descend 2 stairs 1 rail max assist PT Short Term Goal 3  - Progress (Week 2): Progressing toward goal PT Short Term Goal 4 (Week 2): pt will move sit> supine with min assist. PT Short Term Goal 4 - Progress (Week 2): Met Week 3:  PT Short Term Goal 1 (Week 3): Pt will perform squat pivot transfers at S level with only min cues for set up/safety.  PT Short Term Goal 2 (Week 3): Pt will perform bed mobility with HOB flat and without rails at S level with min cues for sequencing/technique.  PT Short Term Goal 3 (Week 3): Pt will propel w/c x 150' using R hemi technique with min cues for scanning L environment.  PT Short Term Goal 4 (Week 3): Pt will ambulate x 25' w/ LRAD at max A of single therapist (+2 for chair follow)  Skilled Therapeutic Interventions/Progress Updates:   Pt received sitting in w/c in room, agreeable to therapy session.  Continue to discuss D/C plans with wife and whether they are going to get a stedy for home use.  She provided PT with blue prints and measurements, as well as pictures of rooms at home and entry ways and bathroom.  Recommend that PT/OT do a home evaluation on Thursday and bring stedy and tub transfer bench in order to practice set up and transfers with and without use of stedy in order to prepare for safe D/C home next week.  Wife and pt verbalize understanding and are prepared for home eval on Thursday.  Pt self propelled to/from therapy gym x 150' x 2 reps  using R hemi technique.  Requires mod to max verbal cues at times for scanning L environment.  Skilled session focused on NMR with tall kneeling and quadruped activities as well as gait with shopping cart and via "three muskateer style."  Performed tall kneeling activity while reaching slightly left of midline and far to the R to encourage L WB as well as weight shift to the R to decrease pusher tendencies.  Then progressed to quadruped while moving cones with RUE to increase WB through [place] while therapist assisted with stabilizing LUE in extension.  Ended session with  gait x 39' with grocery cart with RT in cart to facilitate upright posture with +2 assist for safety and weight shifting to the R for better clearance of LLE.  Note that pt does better with increased speed, however if he missed step, would easily let cart get too far ahead of him.  Then performed another session x 89' via "three muskateer style" to better facilitate upright posture, R weight shift and increased gait speed.  Note improvement from last week.  Pt propelled back to room and was left in w/c with quick release belt donned and all needs in reach.    Therapy Documentation Precautions:  Precautions Precautions: Fall Precaution Comments: left inattention, labile Restrictions Weight Bearing Restrictions: No   Pain: Pain Assessment Pain Assessment: 0-10 Pain Score: 0-No pain Pain Type: Acute pain Pain Location: Hip Pain Orientation: Right Pain Descriptors / Indicators: Aching Pain Frequency: Intermittent Pain Onset: Gradual Patients Stated Pain Goal: 2 Pain Intervention(s): Medication (See eMAR)   Locomotion : Ambulation Ambulation/Gait Assistance: 1: +2 Total assist Wheelchair Mobility Distance: 150 (x 2 reps)   See FIM for current functional status  Therapy/Group: Individual Therapy and co-treat with RT for 30 mins of session.   Denice Bors 10/03/2014, 7:50 PM

## 2014-10-03 NOTE — Progress Notes (Signed)
Subjective/Complaints: No problems overnight. Working with speech therapy on cognition. Swallowing function is good No pains Review of systems is significant for left-sided weakness.  Objective: Vital Signs: Blood pressure 110/78, pulse 63, temperature 97.6 F (36.4 C), temperature source Oral, resp. rate 18, weight 108.6 kg (239 lb 6.7 oz), SpO2 97 %. No results found. Results for orders placed or performed during the hospital encounter of 09/16/14 (from the past 72 hour(s))  Glucose, capillary     Status: Abnormal   Collection Time: 09/30/14 11:15 AM  Result Value Ref Range   Glucose-Capillary 157 (H) 70 - 99 mg/dL   Comment 1 Notify RN   Glucose, capillary     Status: None   Collection Time: 09/30/14  4:19 PM  Result Value Ref Range   Glucose-Capillary 99 70 - 99 mg/dL  Glucose, capillary     Status: None   Collection Time: 09/30/14  9:26 PM  Result Value Ref Range   Glucose-Capillary 94 70 - 99 mg/dL  Glucose, capillary     Status: Abnormal   Collection Time: 10/01/14  7:00 AM  Result Value Ref Range   Glucose-Capillary 103 (H) 70 - 99 mg/dL  Glucose, capillary     Status: Abnormal   Collection Time: 10/01/14 11:47 AM  Result Value Ref Range   Glucose-Capillary 106 (H) 70 - 99 mg/dL  Glucose, capillary     Status: Abnormal   Collection Time: 10/01/14  4:45 PM  Result Value Ref Range   Glucose-Capillary 120 (H) 70 - 99 mg/dL  Glucose, capillary     Status: Abnormal   Collection Time: 10/01/14  9:33 PM  Result Value Ref Range   Glucose-Capillary 128 (H) 70 - 99 mg/dL   Comment 1 Notify RN   Glucose, capillary     Status: Abnormal   Collection Time: 10/02/14  6:58 AM  Result Value Ref Range   Glucose-Capillary 113 (H) 70 - 99 mg/dL   Comment 1 Notify RN   Glucose, capillary     Status: Abnormal   Collection Time: 10/02/14 11:58 AM  Result Value Ref Range   Glucose-Capillary 116 (H) 70 - 99 mg/dL   Comment 1 Notify RN   Glucose, capillary     Status: Abnormal    Collection Time: 10/02/14  4:44 PM  Result Value Ref Range   Glucose-Capillary 151 (H) 70 - 99 mg/dL   Comment 1 Notify RN   Glucose, capillary     Status: Abnormal   Collection Time: 10/02/14  8:52 PM  Result Value Ref Range   Glucose-Capillary 171 (H) 70 - 99 mg/dL  Glucose, capillary     Status: Abnormal   Collection Time: 10/03/14  7:02 AM  Result Value Ref Range   Glucose-Capillary 65 (L) 70 - 99 mg/dL  Glucose, capillary     Status: None   Collection Time: 10/03/14  7:36 AM  Result Value Ref Range   Glucose-Capillary 91 70 - 99 mg/dL     HEENT: normal Cardio: RRR and no murmurs Resp: CTA B/L and unlabored GI: BS positive and nontender nondistended Extremity:  No Edema Skin:   Intact Neuro: Alert/Oriented, Cranial Nerve Abnormalities left central 7, Abnormal Sensory decreased sensation to light touch left upper and left lower extremity, Abnormal Motor 2/5 Left shoulder Add, 2 minus left biceps, 3 minus/5 in hip knee extensor synergy left lower extremity 0/5 at the ankle  Musc/Skel:  Other no pain with left shoulder or left lower extremity range of motion Gen.no  acute distress Mood and affect appropriate  Assessment/Plan: 1. Functional deficits secondary to right MCA infarct which require 3+ hours per day of interdisciplinary therapy in a comprehensive inpatient rehab setting. Physiatrist is providing close team supervision and 24 hour management of active medical problems listed below. Physiatrist and rehab team continue to assess barriers to discharge/monitor patient progress toward functional and medical goals.  FIM: FIM - Bathing Bathing Steps Patient Completed: Chest, Right Arm, Left Arm, Abdomen, Front perineal area, Right upper leg, Left upper leg, Right lower leg (including foot), Left lower leg (including foot) Bathing: 4: Min-Patient completes 8-9 73f 10 parts or 75+ percent  FIM - Upper Body Dressing/Undressing Upper body dressing/undressing steps patient  completed: Pull shirt over trunk, Thread/unthread left sleeve of pullover shirt/dress, Put head through opening of pull over shirt/dress, Thread/unthread right sleeve of pullover shirt/dresss Upper body dressing/undressing: 5: Set-up assist to: Obtain clothing/put away FIM - Lower Body Dressing/Undressing Lower body dressing/undressing steps patient completed: Thread/unthread right pants leg, Don/Doff right shoe, Thread/unthread left pants leg Lower body dressing/undressing: 2: Max-Patient completed 25-49% of tasks  FIM - Toileting Toileting steps completed by patient: Adjust clothing prior to toileting, Performs perineal hygiene Toileting Assistive Devices: Grab bar or rail for support Toileting: 3: Mod-Patient completed 2 of 3 steps  FIM - Air cabin crew Transfers: 0-Activity did not occur  FIM - Control and instrumentation engineer Devices: Arm rests Bed/Chair Transfer: 4: Bed > Chair or W/C: Min A (steadying Pt. > 75%) (w/c>mat)  FIM - Locomotion: Wheelchair Distance: 150 Locomotion: Wheelchair: 5: Travels 150 ft or more: maneuvers on rugs and over door sills with supervision, cueing or coaxing FIM - Locomotion: Ambulation Locomotion: Ambulation Assistive Devices: Nutritional therapist Ambulation/Gait Assistance: 1: +2 Total assist Locomotion: Ambulation: 1: Two helpers  Comprehension Comprehension Mode: Auditory Comprehension: 5-Understands complex 90% of the time/Cues < 10% of the time  Expression Expression Mode: Verbal Expression: 6-Expresses complex ideas: With extra time/assistive device  Social Interaction Social Interaction: 5-Interacts appropriately 90% of the time - Needs monitoring or encouragement for participation or interaction.  Problem Solving Problem Solving Mode: Asleep Problem Solving: 5-Solves basic problems: With no assist  Memory Memory: 6-More than reasonable amt of time  Medical Problem List and Plan:  1. 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 DVT Prophylaxis/Anticoagulation: SQ heparin .Monitor platelet counts and any signs of DVT 3. Pain Management: tylenol as needed 4. Diabetes Mellitus uncontrolledwith peripheral neuropathy.HGB A1C 7.7.SSI. Controlled on Tradjenta 5mg , Lantus 10U qhs and  amaryl 2mg  daily     5. Neuropsych: This patient is capable of making decisions on his own behalf. 6. Skin/Wound Care: Routine skin checks 7. Fluids/Electrolytes/Nutrition:strict I&O.Follow up labs 8.Hypertension. Lisinopril discontinued 09/16/2014 secondary to mild elevation in creatinine to 1.85..(Patient had also been on Coreg 12.5 mg twice a day prior to admission and lisinopril 40 mg daily) Monitor with increased mobility and resume as needed 9. History throat cancer. Status post dissection with radiation chemotherapy 2011 10.Hypothyroidism.Synthroid 11.Diastolic CHF. Monitor for any signs of fluid overload 12.Hyperlipidemia. Crestor 13.GERD.Protonix  LOS (Days) 17 A FACE TO FACE EVALUATION WAS PERFORMED  Alonda Weaber E 10/03/2014, 10:32 AM

## 2014-10-03 NOTE — Progress Notes (Signed)
Speech Language Pathology Daily Session Note  Patient Details  Name: Brian Hall MRN: [id number] Date of Birth: 01/09/1956  Today's Date: 10/03/2014 SLP Individual Time: 1749-4496 SLP Individual Time Calculation (min): 60 min  Short Term Goals: Week 3: SLP Short Term Goal 1 (Week 3): Patient will attend to left of environment and left upper extremity during functional tasks with Supervision verbal/question cues.    SLP Short Term Goal 2 (Week 3): Patient will demonsrate moderately complex problem solving during functional tasks with Min assist. SLP Short Term Goal 3 (Week 3): Patient utilize external aids to assist with recall of daily information wtih Supervision verbal cues.    SLP Short Term Goal 4 (Week 3): Patient will alternate attention to functional tasks for 5-10 minutes with Min verbal cues for redirection.   Skilled Therapeutic Interventions: Skilled treatment session focused on addressing cognition goals.  SLP facilitated session with Mod multimodal cues for attention to left upper extremity during a variety of functional tasks.  Patient completed a moderately complex problem solving task that required him to alternate attention between 2 task rules with Mod faded to [place] verbal cues.  SLP also facilitated session with Min question cues to utilize association as a memory strategy throughout task. Continue with current plan of care.   FIM:  Comprehension Comprehension Mode: Auditory Comprehension: 5-Understands complex 90% of the time/Cues < 10% of the time Expression Expression Mode: Verbal Expression: 6-Expresses complex ideas: With extra time/assistive device Social Interaction Social Interaction: 5-Interacts appropriately 90% of the time - Needs monitoring or encouragement for participation or interaction. Problem Solving Problem Solving: 5-Solves basic problems: With no assist Memory Memory: 6-More than reasonable amt of time  Pain Pain Assessment Pain Assessment:  No/denies pain  Therapy/Group: Individual Therapy  Carmelia Roller., CCC-SLP [phone]  [place] 10/03/2014, 10:23 AM

## 2014-10-03 NOTE — Significant Event (Signed)
Hypoglycemic Event  CBG: 65  Treatment: 15 GM carbohydrate snack  Symptoms: None  Follow-up CBG: Time:0736 CBG Result:91  Possible Reasons for Event: Unknown  Comments/MD notified:Patient given 15 gram carb snack and breakfast given. No complaints from patient. Will continue to monitor.    Brian Hall G  Remember to initiate Hypoglycemia Order Set & complete

## 2014-10-03 NOTE — Significant Event (Signed)
Hypoglycemic Event  CBG: 63  Treatment: 15 GM carbohydrate snack  Symptoms: None  Follow-up CBG: Time:2223 CBG Result:96  Possible Reasons for Event: Unknown  Comments/MD notified: Rush Barer H  Remember to initiate Hypoglycemia Order Set & complete

## 2014-10-03 NOTE — Progress Notes (Signed)
Occupational Therapy Session Note  Patient Details  Name: Brian Hall MRN: [id number] Date of Birth: 1956-06-28  Today's Date: 10/03/2014 OT Individual Time: 1005-1105 OT Individual Time Calculation (min): 60 min    Short Term Goals: Week 1:  OT Short Term Goal 1 (Week 1): Pt will transfer to BSC/toilet with mod A  OT Short Term Goal 1 - Progress (Week 1): Progressing toward goal OT Short Term Goal 2 (Week 1): Pt will don shirt with mod A with instructional cues OT Short Term Goal 2 - Progress (Week 1): Met OT Short Term Goal 3 (Week 1): Pt will perform sit to stand for clothing mangement with min  A OT Short Term Goal 3 - Progress (Week 1): Progressing toward goal OT Short Term Goal 4 (Week 1): Pt will attend to left visual field durning ADL (39mn session) with mod cuing  OT Short Term Goal 4 - Progress (Week 1): Met Week 2:  OT Short Term Goal 1 (Week 2): Pt will perform shower transfer with Max A in order to decrease level of assistance for functional transfer. OT Short Term Goal 1 - Progress (Week 2): Met OT Short Term Goal 2 (Week 2): Pt will demonstrate purse lip breathing during ADL session in order to decrease SOB with functional activity. OT Short Term Goal 2 - Progress (Week 2): Met OT Short Term Goal 3 (Week 2): Pt will perform toilet transfer with Mod A in order to decrease level of assist for functional transfers. OT Short Term Goal 3 - Progress (Week 2): Progressing toward goal OT Short Term Goal 4 (Week 2): Pt will perform LB dressing with Mod A utilizing hemiplegic dressing techniques as needed. OT Short Term Goal 4 - Progress (Week 2): Progressing toward goal Week 3:  OT Short Term Goal 1 (Week 3): Pt will perform STS and maintain balance with Mod A while therapist assist with clothing management for LB dressing.  OT Short Term Goal 2 (Week 3): Pt will perform toilet transfer from wheelchair to toilet/BSC with Mod A. OT Short Term Goal 3 (Week 3): Pt will  demonstrate safety awareness by managing L UE during functional transfers and self care tasks independently in order to increase safety. OT Short Term Goal 4 (Week 3): Pt will demonstrate lateral lean in shower to wash buttocks with steady assist while on TTB.   Skilled Therapeutic Interventions/Progress Updates:    Pt seen for BADL retraining of B/D with a focus on sit to stand, standing balance, and hemi dressing techniques. Pt received sitting in w/c. Pt worked on sit >< stand numerous times at sink with a focus on symmetrical control and leaning forward. Each time he was able to stand with just guiding A. Pt would then stand for a few seconds ensuring his balance was steady before releasing his R hand from the sink to complete LB self care and pulling up his pants with min A. In standing, pt was able to stabilize his balance with light min A about 70% of the time and then he would need mod A if he began leaning to the left more. He responded to cues to find midline well. Once self care was completed, pt worked on small isolated movements of LUE focusing on small ROM of L sh flex/ext, elb flex with assisted extension. Demonstrated to his wife how she can assist him with these movements using towel glides on his lap tray.  Pt is independent with self ROM of his L finger  flexors. Pt resting in chair with wife with him with lap tray on at end of session.  Therapy Documentation Precautions:  Precautions Precautions: Fall Precaution Comments: left inattention, labile Restrictions Weight Bearing Restrictions: No    Vital Signs: Therapy Vitals Pulse Rate: 63 BP: 110/78 mmHg Pain: Pain Assessment Pain Assessment: No/denies pain Pain Score: 0-No pain ADL: ADL ADL Comments: see FIM  See FIM for current functional status  Therapy/Group: Individual Therapy  [place] 10/03/2014, 12:08 PM

## 2014-10-03 NOTE — Significant Event (Signed)
Hypoglycemic Event  CBG: 62  Treatment: 15 GM carbohydrate snack  Symptoms: None  Follow-up CBG: Time: 2155 CBG Result: 63   Possible Reasons for Event: Unknown  Comments/MD notified:    Charlann Noss  Remember to initiate Hypoglycemia Order Set & complete

## 2014-10-04 ENCOUNTER — Inpatient Hospital Stay (HOSPITAL_COMMUNITY): Payer: 59 | Admitting: Rehabilitation

## 2014-10-04 ENCOUNTER — Inpatient Hospital Stay (HOSPITAL_COMMUNITY): Payer: 59 | Admitting: *Deleted

## 2014-10-04 ENCOUNTER — Inpatient Hospital Stay (HOSPITAL_COMMUNITY): Payer: 59 | Admitting: Occupational Therapy

## 2014-10-04 ENCOUNTER — Inpatient Hospital Stay (HOSPITAL_COMMUNITY): Payer: 59 | Admitting: Speech Pathology

## 2014-10-04 LAB — GLUCOSE, CAPILLARY
GLUCOSE-CAPILLARY: 105 mg/dL — AB (ref 70–99)
GLUCOSE-CAPILLARY: 105 mg/dL — AB (ref 70–99)
Glucose-Capillary: 56 mg/dL — ABNORMAL LOW (ref 70–99)
Glucose-Capillary: 100 mg/dL — ABNORMAL HIGH (ref 70–99)
Glucose-Capillary: 103 mg/dL — ABNORMAL HIGH (ref 70–99)

## 2014-10-04 NOTE — Progress Notes (Signed)
Occupational Therapy Session Note  Patient Details  Name: Brian Hall MRN: [id number] Date of Birth: [birth year]  Today's Date: 10/04/2014 OT Individual Time: 1300-1330 OT Individual Time Calculation (min): 30 min    Short Term Goals: Week 3:  OT Short Term Goal 1 (Week 3): Pt will perform STS and maintain balance with Mod A while therapist assist with clothing management for LB dressing.  OT Short Term Goal 2 (Week 3): Pt will perform toilet transfer from wheelchair to toilet/BSC with Mod A. OT Short Term Goal 3 (Week 3): Pt will demonstrate safety awareness by managing L UE during functional transfers and self care tasks independently in order to increase safety. OT Short Term Goal 4 (Week 3): Pt will demonstrate lateral lean in shower to wash buttocks with steady assist while on TTB.   Skilled Therapeutic Interventions/Progress Updates:    Engaged in [place] with focus on standing balance and WB through LUE.  Sit > stand x5 at high-low table with focus on control and then standing balance while reaching across midline with Rt to promote WB through LUE.  Required manual facilitation to achieve open hand, however post WB pt able to manually achieve open hand with increased ease.  Mod progressing to min assist with sit > stand throughout session with focus  on BLE placement to promote equal WB in standing.  Pt would benefit from WB through LUE prior to completing self-care tasks to increase incorporation of LUE into tasks even at a stabilizer - gross assist level.  Therapy Documentation Precautions:  Precautions Precautions: Fall Precaution Comments: left inattention, labile Restrictions Weight Bearing Restrictions: No Pain: Pain Assessment Pain Assessment: No/denies pain Pain Score: 0-No pain ADL: ADL ADL Comments: see FIM  See FIM for current functional status  Therapy/Group: Individual Therapy  Simonne Come 10/04/2014, 3:08 PM

## 2014-10-04 NOTE — Progress Notes (Signed)
Speech Language Pathology Daily Session Note  Patient Details  Name: Brian Hall MRN: [id number] Date of Birth: [birth year]  Today's Date: 10/04/2014 SLP Individual Time: 1510-1540 SLP Individual Time Calculation (min): 30 min  Short Term Goals: Week 3: SLP Short Term Goal 1 (Week 3): Patient will attend to left of environment and left upper extremity during functional tasks with Supervision verbal/question cues.    SLP Short Term Goal 2 (Week 3): Patient will demonsrate moderately complex problem solving during functional tasks with Min assist. SLP Short Term Goal 3 (Week 3): Patient utilize external aids to assist with recall of daily information wtih Supervision verbal cues.    SLP Short Term Goal 4 (Week 3): Patient will alternate attention to functional tasks for 5-10 minutes with Min verbal cues for redirection.   Skilled Therapeutic Interventions: Skilled treatment session focused on addressing cognition goals.  SLP facilitated session with Min assist multimodal cues for attention to left upper extremity during a wheelchair mobility task.  Patient also completed a moderately complex problem solving task that required him to alternate attention between 2 task rules with Mod faded to [place] verbal cues to reinforce use of external aid for new learning procedures.  Continue with current plan of care.   FIM:  Comprehension Comprehension Mode: Auditory Comprehension: 5-Follows basic conversation/direction: With no assist Expression Expression Mode: Verbal Expression: 6-Expresses complex ideas: With extra time/assistive device Social Interaction Social Interaction: 6-Interacts appropriately with others with medication or extra time (anti-anxiety, antidepressant). Problem Solving Problem Solving: 5-Solves basic 90% of the time/requires cueing < 10% of the time Memory Memory: 5-Recognizes or recalls 90% of the time/requires cueing < 10% of the time  Pain Pain Assessment Pain  Assessment: No/denies pain Pain Score: 0-No pain  Therapy/Group: Individual Therapy  Carmelia Roller., CCC-SLP [phone]  [place] 10/04/2014, 4:43 PM

## 2014-10-04 NOTE — Progress Notes (Signed)
Occupational Therapy Session Note  Patient Details  Name: Brian Hall MRN: [id number] Date of Birth: 1956-04-02  Today's Date: 10/04/2014 OT Individual Time: 1110-1210 OT Individual Time Calculation (min): 60 min    Short Term Goals: Week 1:  OT Short Term Goal 1 (Week 1): Pt will transfer to BSC/toilet with mod A  OT Short Term Goal 1 - Progress (Week 1): Progressing toward goal OT Short Term Goal 2 (Week 1): Pt will don shirt with mod A with instructional cues OT Short Term Goal 2 - Progress (Week 1): Met OT Short Term Goal 3 (Week 1): Pt will perform sit to stand for clothing mangement with min  A OT Short Term Goal 3 - Progress (Week 1): Progressing toward goal OT Short Term Goal 4 (Week 1): Pt will attend to left visual field durning ADL (37mn session) with mod cuing  OT Short Term Goal 4 - Progress (Week 1): Met Week 2:  OT Short Term Goal 1 (Week 2): Pt will perform shower transfer with Max A in order to decrease level of assistance for functional transfer. OT Short Term Goal 1 - Progress (Week 2): Met OT Short Term Goal 2 (Week 2): Pt will demonstrate purse lip breathing during ADL session in order to decrease SOB with functional activity. OT Short Term Goal 2 - Progress (Week 2): Met OT Short Term Goal 3 (Week 2): Pt will perform toilet transfer with Mod A in order to decrease level of assist for functional transfers. OT Short Term Goal 3 - Progress (Week 2): Progressing toward goal OT Short Term Goal 4 (Week 2): Pt will perform LB dressing with Mod A utilizing hemiplegic dressing techniques as needed. OT Short Term Goal 4 - Progress (Week 2): Progressing toward goal Week 3:  OT Short Term Goal 1 (Week 3): Pt will perform STS and maintain balance with Mod A while therapist assist with clothing management for LB dressing.  OT Short Term Goal 2 (Week 3): Pt will perform toilet transfer from wheelchair to toilet/BSC with Mod A. OT Short Term Goal 3 (Week 3): Pt will  demonstrate safety awareness by managing L UE during functional transfers and self care tasks independently in order to increase safety. OT Short Term Goal 4 (Week 3): Pt will demonstrate lateral lean in shower to wash buttocks with steady assist while on TTB.   Skilled Therapeutic Interventions/Progress Updates:    Pt seen for BADL retraining of bathing at shower level and dressing with a focus on sit >< stand and standing balance. Pt is doing well with hemi techniques using long handled sponge, crossing his ankles, etc to complete his self care. He did well completed squat pivot into and out of shower with mod A keeping his L foot in place. Pt had more difficulty in standing in shower with keeping his left foot in place as it kept sliding in.  He almost lost his balance to his left, so bathing his bottom area continued from sitting on tub bench. Pt later worked on standing at sink to [place] pants and had more stability as this clinician was able to provide support through his L side and he could stabilize himself on the sink with his R hand.   Pt was then seen for LUE neuro re-ed with estim to wrist and finger extensors at intensity 31, 35 pps for 10 sec on and off for 10 min. Pt had a good response and enjoyed seeing his fingers extend. No active  extension after tx was completed. Pt worked on various A/AROM towel slides with arm resting on bedside table.    Pt resting in room with all needs met and call light in reach.  Therapy Documentation Precautions:  Precautions Precautions: Fall Precaution Comments: left inattention, labile Restrictions Weight Bearing Restrictions: No  Pain: no c/o pain   ADL: ADL ADL Comments: see FIM  See FIM for current functional status  Therapy/Group: Individual Therapy  [place] 10/04/2014, 12:30 PM

## 2014-10-04 NOTE — Progress Notes (Signed)
Physical Therapy Session Note  Patient Details  Name: Brian Hall MRN: [id number] Date of Birth: 02-28-1956  Today's Date: 10/04/2014 PT Individual Time: 0830-0930 PT Individual Time Calculation (min): 60 min   Short Term Goals: Week 3:  PT Short Term Goal 1 (Week 3): Pt will perform squat pivot transfers at S level with only min cues for set up/safety.  PT Short Term Goal 2 (Week 3): Pt will perform bed mobility with HOB flat and without rails at S level with min cues for sequencing/technique.  PT Short Term Goal 3 (Week 3): Pt will propel w/c x 150' using R hemi technique with min cues for scanning L environment.  PT Short Term Goal 4 (Week 3): Pt will ambulate x 25' w/ LRAD at max A of single therapist (+2 for chair follow)  Skilled Therapeutic Interventions/Progress Updates:   Pt received lying in bed, agreeable to therapy session.  Performed bed mobility at min A level, however continues to require mod to max A cues for sequencing and technique without bed rails.  Assisted with donning TEDs and shoes/AFO for time management.  Transferred to w/c via squat pivot at min A level with mod cues for L foot placement.  Pt self propelled to/from therapy gym x 150' x 2 reps using R hemi technique at S level.  Note marked improvement today with scanning L environment and requires only min/mod cues to avoid obstacles.  Once at therapy gym, performed gait x 2 reps of 25' using hemi walker with L AFO and shoe cover for increased DF assist and foot clearance.  Requires max to total A with +2 for chair follow with max verbal and demonstration cues on sequencing and technique with hemi walker.  Pt continues to demonstrate severe posterior lean with all effortful movements in LEs.  Provided strong facilitation at trunk and hips for upright posture and forward translation over LLE during stance.  Pt able to advance LLE on his own, but again tends to have difficulty at times due to decreased weight shift to the  R.  Remainder of session focused on dynamic standing and stepping tasks in order to increase WB through LLE, upright posture, orientation to midline and weight shift to the R.  Worked on stepping to/from 4" step with RLE then with LLE with use of mirror for increased visual feedback.  Also worked on reaching for horse shoes out to the far R for weight shift to the R and activation of LLE to push weight over.  Tolerated well.  Discussed that even though he will not be ambulatory at home with wife, HHPT will continue to work on this and he will also continue to work on at [facility] when time is right.  Pt verbalized understanding.  Pt returned to room, left in w/c with all needs in reach and lap tray donned.  No QRB today, will D/C.    Therapy Documentation Precautions:  Precautions Precautions: Fall Precaution Comments: left inattention, labile Restrictions Weight Bearing Restrictions: No  Pain: Pt with no c/o pain during session.    Locomotion : Ambulation Ambulation/Gait Assistance: 1: +2 Total assist;1: +1 Total assist (+2 for chair follow) Wheelchair Mobility Distance: 150   See FIM for current functional status  Therapy/Group: Individual Therapy  Denice Bors 10/04/2014, 10:37 AM

## 2014-10-04 NOTE — Progress Notes (Signed)
Recreational Therapy Session Note  Patient Details  Name: Brian Hall MRN: [id number] Date of Birth: 11/12/1955 Today's Date: 10/04/2014  Pain: no c/o Skilled Therapeutic Interventions/Progress Updates: Session focused on LUE use during tabletop card game with hand over hand assist for glided movements. Pt with multiple questions throughout session about continued recovery post discharge. Questions answered & emotional support provided.  Therapy/Group: Individual Therapy   Candiace West 10/04/2014, 4:50 PM

## 2014-10-04 NOTE — Progress Notes (Signed)
Subjective/Complaints: No problems overnight. Working with speech therapy on cognition. Swallowing function is good No pains Review of systems is significant for left-sided weakness.  Objective: Vital Signs: Blood pressure 112/59, pulse 58, temperature 97.9 F (36.6 C), temperature source Oral, resp. rate 18, weight 108.6 kg (239 lb 6.7 oz), SpO2 99 %. No results found. Results for orders placed or performed during the hospital encounter of 09/16/14 (from the past 72 hour(s))  Glucose, capillary     Status: Abnormal   Collection Time: 10/01/14 11:47 AM  Result Value Ref Range   Glucose-Capillary 106 (H) 70 - 99 mg/dL  Glucose, capillary     Status: Abnormal   Collection Time: 10/01/14  4:45 PM  Result Value Ref Range   Glucose-Capillary 120 (H) 70 - 99 mg/dL  Glucose, capillary     Status: Abnormal   Collection Time: 10/01/14  9:33 PM  Result Value Ref Range   Glucose-Capillary 128 (H) 70 - 99 mg/dL   Comment 1 Notify RN   Glucose, capillary     Status: Abnormal   Collection Time: 10/02/14  6:58 AM  Result Value Ref Range   Glucose-Capillary 113 (H) 70 - 99 mg/dL   Comment 1 Notify RN   Glucose, capillary     Status: Abnormal   Collection Time: 10/02/14 11:58 AM  Result Value Ref Range   Glucose-Capillary 116 (H) 70 - 99 mg/dL   Comment 1 Notify RN   Glucose, capillary     Status: Abnormal   Collection Time: 10/02/14  4:44 PM  Result Value Ref Range   Glucose-Capillary 151 (H) 70 - 99 mg/dL   Comment 1 Notify RN   Glucose, capillary     Status: Abnormal   Collection Time: 10/02/14  8:52 PM  Result Value Ref Range   Glucose-Capillary 171 (H) 70 - 99 mg/dL  Glucose, capillary     Status: Abnormal   Collection Time: 10/03/14  7:02 AM  Result Value Ref Range   Glucose-Capillary 65 (L) 70 - 99 mg/dL  Glucose, capillary     Status: None   Collection Time: 10/03/14  7:36 AM  Result Value Ref Range   Glucose-Capillary 91 70 - 99 mg/dL  Glucose, capillary     Status:  None   Collection Time: 10/03/14 11:13 AM  Result Value Ref Range   Glucose-Capillary 97 70 - 99 mg/dL   Comment 1 Notify RN   Glucose, capillary     Status: None   Collection Time: 10/03/14  4:51 PM  Result Value Ref Range   Glucose-Capillary 82 70 - 99 mg/dL   Comment 1 Notify RN   Glucose, capillary     Status: Abnormal   Collection Time: 10/03/14  9:22 PM  Result Value Ref Range   Glucose-Capillary 62 (L) 70 - 99 mg/dL   Comment 1 Notify RN   Glucose, capillary     Status: Abnormal   Collection Time: 10/03/14  9:53 PM  Result Value Ref Range   Glucose-Capillary 63 (L) 70 - 99 mg/dL  Glucose, capillary     Status: Abnormal   Collection Time: 10/03/14 10:00 PM  Result Value Ref Range   Glucose-Capillary 56 (L) 70 - 99 mg/dL  Glucose, capillary     Status: None   Collection Time: 10/03/14 10:23 PM  Result Value Ref Range   Glucose-Capillary 96 70 - 99 mg/dL  Glucose, capillary     Status: Abnormal   Collection Time: 10/04/14  7:06 AM  Result  Value Ref Range   Glucose-Capillary 105 (H) 70 - 99 mg/dL     HEENT: normal Cardio: RRR and no murmurs Resp: CTA B/L and unlabored GI: BS positive and nontender nondistended Extremity:  No Edema Skin:   Intact Neuro: Alert/Oriented, Cranial Nerve Abnormalities left central 7, Abnormal Sensory decreased sensation to light touch left upper and left lower extremity, Abnormal Motor 2/5 Left shoulder Add, 2 minus left biceps,0/5 finger ext/flex  3 minus/5 in hip knee extensor synergy left lower extremity 0/5 at the ankle  Musc/Skel:  Other no pain with left shoulder or left lower extremity range of motion Gen.no acute distress Mood and affect appropriate  Assessment/Plan: 1. Functional deficits secondary to right MCA infarct which require 3+ hours per day of interdisciplinary therapy in a comprehensive inpatient rehab setting. Physiatrist is providing close team supervision and 24 hour management of active medical problems listed  below. Physiatrist and rehab team continue to assess barriers to discharge/monitor patient progress toward functional and medical goals.  FIM: FIM - Bathing Bathing Steps Patient Completed: Chest, Right Arm, Abdomen, Front perineal area, Right upper leg, Left upper leg, Right lower leg (including foot), Left lower leg (including foot) Bathing: 4: Min-Patient completes 8-9 73f 10 parts or 75+ percent  FIM - Upper Body Dressing/Undressing Upper body dressing/undressing steps patient completed: Pull shirt over trunk, Thread/unthread left sleeve of pullover shirt/dress, Put head through opening of pull over shirt/dress, Thread/unthread right sleeve of pullover shirt/dresss Upper body dressing/undressing: 5: Set-up assist to: Obtain clothing/put away FIM - Lower Body Dressing/Undressing Lower body dressing/undressing steps patient completed: Thread/unthread right pants leg, Don/Doff right shoe, Thread/unthread left pants leg Lower body dressing/undressing: 2: Max-Patient completed 25-49% of tasks  FIM - Toileting Toileting steps completed by patient: Adjust clothing prior to toileting, Performs perineal hygiene Toileting Assistive Devices: Grab bar or rail for support Toileting: 3: Mod-Patient completed 2 of 3 steps  FIM - Air cabin crew Transfers: 0-Activity did not occur  FIM - Control and instrumentation engineer Devices: Arm rests Bed/Chair Transfer: 4: Bed > Chair or W/C: Min A (steadying Pt. > 75%)  FIM - Locomotion: Wheelchair Distance: 150 (x 2 reps) Locomotion: Wheelchair: 5: Travels 150 ft or more: maneuvers on rugs and over door sills with supervision, cueing or coaxing FIM - Locomotion: Ambulation Locomotion: Ambulation Assistive Devices: Other (comment) (grocery cart and three muskateers) Ambulation/Gait Assistance: 1: +2 Total assist Locomotion: Ambulation: 1: Two helpers  Comprehension Comprehension Mode: Auditory Comprehension: 5-Understands complex  90% of the time/Cues < 10% of the time  Expression Expression Mode: Verbal Expression: 6-Expresses complex ideas: With extra time/assistive device  Social Interaction Social Interaction: 5-Interacts appropriately 90% of the time - Needs monitoring or encouragement for participation or interaction.  Problem Solving Problem Solving Mode: Asleep Problem Solving: 5-Solves basic problems: With no assist  Memory Memory: 6-More than reasonable amt of time  Medical Problem List and Plan:  1. 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 DVT Prophylaxis/Anticoagulation: SQ heparin .Monitor platelet counts and any signs of DVT 3. Pain Management: tylenol as needed 4. Diabetes Mellitus uncontrolledwith peripheral neuropathy.HGB A1C 7.7.SSI. Controlled on Tradjenta 5mg , Lantus 10U qhs and  amaryl 2mg  daily     5. Neuropsych: This patient is capable of making decisions on his own behalf. 6. Skin/Wound Care: Routine skin checks 7. Fluids/Electrolytes/Nutrition:strict I&O.Follow up labs 8.Hypertension. Lisinopril discontinued 09/16/2014 secondary to mild elevation in creatinine to 1.85..(Patient had also been on Coreg 12.5 mg twice  a day prior to admission and lisinopril 40 mg daily) Monitor with increased mobility and resume as needed 9. History throat cancer. Status post dissection with radiation chemotherapy 2011 10.Hypothyroidism.Synthroid 11.Diastolic CHF. Monitor for any signs of fluid overload 12.Hyperlipidemia. Crestor 13.GERD.Protonix  LOS (Days) 18 A FACE TO FACE EVALUATION WAS PERFORMED  KIRSTEINS,ANDREW E 10/04/2014, 8:16 AM

## 2014-10-05 ENCOUNTER — Inpatient Hospital Stay (HOSPITAL_COMMUNITY): Payer: 59 | Admitting: Speech Pathology

## 2014-10-05 ENCOUNTER — Ambulatory Visit (HOSPITAL_COMMUNITY): Payer: 59 | Admitting: Rehabilitation

## 2014-10-05 ENCOUNTER — Inpatient Hospital Stay (HOSPITAL_COMMUNITY): Payer: 59 | Admitting: Occupational Therapy

## 2014-10-05 LAB — GLUCOSE, CAPILLARY
Glucose-Capillary: 95 mg/dL (ref 70–99)
Glucose-Capillary: 128 mg/dL — ABNORMAL HIGH (ref 70–99)
Glucose-Capillary: 104 mg/dL — ABNORMAL HIGH (ref 70–99)
Glucose-Capillary: 156 mg/dL — ABNORMAL HIGH (ref 70–99)

## 2014-10-05 NOTE — Patient Care Conference (Signed)
Inpatient RehabilitationTeam Conference and Plan of Care Update Date: 10/05/2014   Time: 11;25 AM    Patient Name: Brian Hall      Medical Record Number: [id number]  Date of Birth: Jan 12, 1956 Sex: Male         Room/Bed: 4W06C/4W06C-01 Payor Info: Payor: [employer] / Plan: Electrical engineer / Product Type: *No Product type* /    Admitting Diagnosis: cva  Admit Date/Time:  09/16/2014  3:20 PM Admission Comments: No comment available   Primary Diagnosis:  Acute ischemic right MCA stroke Principal Problem: Acute ischemic right MCA stroke  Patient Active Problem List   Diagnosis Date Noted  . Left-sided neglect 09/23/2014  . Acute ischemic right MCA stroke 09/16/2014  . Left hemiparesis   . CVA (cerebral vascular accident)   . Right carotid artery occlusion   . Acute right MCA stroke   . Essential hypertension 09/12/2014  . CVA (cerebral infarction) 09/12/2014  . Type 2 diabetes mellitus with peripheral neuropathy 09/12/2014    Expected Discharge Date: Expected Discharge Date: 10/14/14  Team Members Present: Physician leading conference: Dr. Alysia Penna Social Worker Present: Ovidio Kin, LCSW Nurse Present: Heather Roberts, RN PT Present: Raylene Everts, PT;Danis Pembleton Varner, PT;Emily Rinaldo Cloud, PT OT Present: Benay Pillow, OT SLP Present: Gunnar Fusi, SLP PPS Coordinator present : Daiva Nakayama, RN, CRRN     Current Status/Progress Goal Weekly Team Focus  Medical   emotional lability improving, increase LLE and LUE movement  Home D/C  d/c planning   Bowel/Bladder     cont B & B        Swallow/Nutrition/ Hydration   intermittent supervision due to cognition          ADL's   supervision grooming and UB dressing; min bathing, max LB dressing; mod A transfers  supervision to min assist level  ADL retraining, balance, LUE re-ed, pt/family education   Mobility   min A for bed mobility, min to close S for squat pivot transfers, mod A for standing, mod-total A for dynamic standing and  gait.  Initiated gait with hemi walker, however requires max to total A (+2 for chair follow).   min A overall  L NMR, midline orientation, sitting/standing balance, balanced muscle activation during transitional movements, postural/gait stability, continued pt/family education   Communication     na        Safety/Cognition/ Behavioral Observations  Min assist with basic and Mod assist with complex  Min assist   increase attention to left upper extremity and self-monitoring and correcting of errors   Pain     monitor pain-sciatic nerve pain-meds helping   monitor pain     Skin     na           *See Care Plan and progress notes for long and short-term goals.  Barriers to Discharge: wife primary caregiver    Possible Resolutions to Barriers:  family training    Discharge Planning/Teaching Needs:  Home with wife who is here daily and attending therapies with pt      Team Discussion:  Making good progress in therapies-ambulating min-mod assist.  Ramp done. Not using steady lift now due to progress. Possibly upgrade goals. Home eval tomorrow  Revisions to Treatment Plan:  None   Continued Need for Acute Rehabilitation Level of Care: The patient requires daily medical management by a physician with specialized training in physical medicine and rehabilitation for the following conditions: Daily direction of a multidisciplinary physical rehabilitation program to ensure safe  treatment while eliciting the highest outcome that is of practical value to the patient.: Yes Daily medical management of patient stability for increased activity during participation in an intensive rehabilitation regime.: Yes Daily analysis of laboratory values and/or radiology reports with any subsequent need for medication adjustment of medical intervention for : Neurological problems;Other  Emory Leaver, [name] 10/06/2014, 1:26 PM

## 2014-10-05 NOTE — Progress Notes (Signed)
Social Work Patient ID: Brian Hall, male   DOB: Jul 18, 1956, 59 y.o.   MRN: [id number] Met with pt and wife to discuss team conference progression toward his goals and discharge 2/12.  Both pleased with how well he has done this week.  He can see the progress he has made. Wife reports not using the steady lift anymore.  Pt wants to do more and get to a higher level than therapists goals are set for.  Ramp is completed.  Work toward discharge 2/.12

## 2014-10-05 NOTE — Progress Notes (Signed)
Physical Therapy Session Note  Patient Details  Name: Brian Hall MRN: [id number] Date of Birth: 10-11-1955  Today's Date: 10/05/2014 PT Individual Time: 0930-1030 PT Individual Time Calculation (min): 60 min   Short Term Goals: Week 3:  PT Short Term Goal 1 (Week 3): Pt will perform squat pivot transfers at S level with only min cues for set up/safety.  PT Short Term Goal 2 (Week 3): Pt will perform bed mobility with HOB flat and without rails at S level with min cues for sequencing/technique.  PT Short Term Goal 3 (Week 3): Pt will propel w/c x 150' using R hemi technique with min cues for scanning L environment.  PT Short Term Goal 4 (Week 3): Pt will ambulate x 25' w/ LRAD at max A of single therapist (+2 for chair follow)  Skilled Therapeutic Interventions/Progress Updates:   Pt received sitting in w/c in room, agreeable to therapy session.  Pt self propelled to/from therapy gym x 150' x 2 reps using R hemi technique at S level.  Note continued improvement today with w/c mobility today and only needed single cue to avoid door on the L side.  Skilled session focused on functional transfers to bed, couch and car to begin training for tomorrow's home evaluation.  Pt able to negotiate w/c in room in tight spaces at S level with cues for safety.  Performed transfer to bed (going both R and L side of bed as he is unsure of which side he will be on at home) at close S level with min to mod cues for set up, parts management, and safety of LUE/LE.  Performed bed mobility at min A level x 2-3 reps with cues for rolling with knee flex and trunk rotation.  Also provided cues to remain in SL then transition to supine position.  Transferred back to w/c as stated above.  He will need assist for management of L leg rest.  Then transferred to couch in ADL apt at S to light min A level again with cues for w/c set up, parts and LE management and increased forward weight shift.  Transitioned to performing car  transfer to simulated Jeep height.  Pt able to perform at min A level (close to S with Min A for safety and guiding hips when transferring to the L).  Cues for scooting and hip transitioning when pivoting into car.  Also provided cues for positioning of wife.  Ended session with gait x 2 bouts 90' x 1 and another 29' x 1 with hemi walker at max A level (+2 for chair follow and with theraband around chest to encourage upright support. Pt did much better today during first bout maintaining upright posture and was able to clear LLE throughout with use of L AFO and shoe cover.  Continue to provide cues for sequencing esp with use of hemi walker.  Pt left in w/c and propelled back to room.  Left with wife in w/c in room.    Therapy Documentation Precautions:  Precautions Precautions: Fall Precaution Comments: left inattention, labile Restrictions Weight Bearing Restrictions: No  Pain: Pt with no pain during session.    Locomotion : Ambulation Ambulation/Gait Assistance: 2: Max assist;1: +2 Total assist (chair follow) Wheelchair Mobility Distance: 150   See FIM for current functional status  Therapy/Group: Individual Therapy  Denice Bors 10/05/2014, 11:27 AM

## 2014-10-05 NOTE — Progress Notes (Signed)
Occupational Therapy Session Note  Patient Details  Name: Brian Hall MRN: [id number] Date of Birth: 09-27-1955  Today's Date: 10/05/2014 OT Individual Time: 6546-5035 OT Individual Time Calculation (min): 60 min    Short Term Goals: Week 3:  OT Short Term Goal 1 (Week 3): Pt will perform STS and maintain balance with Mod A while therapist assist with clothing management for LB dressing.  OT Short Term Goal 2 (Week 3): Pt will perform toilet transfer from wheelchair to toilet/BSC with Mod A. OT Short Term Goal 3 (Week 3): Pt will demonstrate safety awareness by managing L UE during functional transfers and self care tasks independently in order to increase safety. OT Short Term Goal 4 (Week 3): Pt will demonstrate lateral lean in shower to wash buttocks with steady assist while on TTB.   Skilled Therapeutic Interventions/Progress Updates:  Upon entering the room, pt supine in bed with no c/o pain and his wife present in room. Supine >sit with Min A with HOB flat. Min A squat pivot into wheelchair from bed. Pt propelled wheelchair into bathroom with Mod A for squat pivot transfer from wheelchair onto TTB. Pt utilizing bath mitt on L hand and LH sponge in order to increased I in bathing tasks. Pt requiring assist to wash buttocks only. Dressing performed at sink side with STS from wheelchair. STS with Mod A and requiring Mod - Max A for balance as pt manages LB clothing up around hips. Pt able to thread pants and underwear onto L foot this session which he has been previously unable to do. Pt making great progress towards goals. Pt seated in wheelchair with L arm tray donned and call bell within reach upon exiting the room.   Therapy Documentation Precautions:  Precautions Precautions: Fall Precaution Comments: left inattention, labile Restrictions Weight Bearing Restrictions: No   Pain: Pain Assessment Pain Score: Asleep ADL: ADL ADL Comments: see FIM  See FIM for current  functional status  Therapy/Group: Individual Therapy  [name] 10/05/2014, 11:15 AM

## 2014-10-05 NOTE — Progress Notes (Signed)
Speech Language Pathology Weekly Progress and Session Note  Patient Details  Name: VERSIE FLEENER MRN: [id number] Date of Birth: 07-08-1956  Beginning of progress report period: September 29, 2014 End of progress report period: October 05, 2014  Today's Date: 10/05/2014 SLP Individual Time: 1500-1600 SLP Individual Time Calculation (min): 60 min  Short Term Goals: Week 3: SLP Short Term Goal 1 (Week 3): Patient will attend to left of environment and left upper extremity during functional tasks with Supervision verbal/question cues.    SLP Short Term Goal 1 - Progress (Week 3): Progressing toward goal SLP Short Term Goal 2 (Week 3): Patient will demonsrate moderately complex problem solving during functional tasks with Min assist. SLP Short Term Goal 2 - Progress (Week 3): Progressing toward goal SLP Short Term Goal 3 (Week 3): Patient utilize external aids to assist with recall of daily information wtih Supervision verbal cues.    SLP Short Term Goal 3 - Progress (Week 3): Met SLP Short Term Goal 4 (Week 3): Patient will alternate attention to functional tasks for 5-10 minutes with Min verbal cues for redirection.  SLP Short Term Goal 4 - Progress (Week 3): Met    New Short Term Goals: Week 4: SLP Short Term Goal 1 (Week 4): Patient will attend to left of environment and left upper extremity during functional tasks with Supervision verbal/question cues.    SLP Short Term Goal 2 (Week 4): Patient will demonsrate moderately complex problem solving during functional tasks with Min assist. SLP Short Term Goal 3 (Week 4): Patient will alternate attention to functional tasks for 10-15 minutes with Min verbal cues for redirection.   Weekly Progress Updates: Patient has made functional gains and has met 2 out of 4 short term goals this reporting period due to improved ability to attend to left upper extremity, alternate attention, use external aids and problem solve basic tasks.  Currently,  patient continues to require Min assist for left attention to environment and upper extremity, problem solving with familiar tasks.  Patient and family education is ongoing and patient would benefit from continued skilled SLP intervention to maximize his functional independence prior to discharge home with 24/7 assist.      Intensity: Minumum of 1-2 x/day, 30 to 90 minutes Frequency: 5 out of 7 days Duration/Length of Stay: 10/14/14 Treatment/Interventions: Cognitive remediation/compensation;Cueing hierarchy;Patient/family education;Functional tasks;Internal/external aids;Environmental controls;Speech/Language facilitation   Daily Session  Skilled Therapeutic Interventions:  Skilled treatment session focused on addressing cognition goals. SLP facilitated session with Mod assist multimodal cues for attention to left upper extremity in an unstructured task.  Patient also completed a moderately complex problem solving task with Mod verbal assist to complete time calculations and Min verbal assist to accurately identify health food options while developing a grocery list.  During discussion regarding discharge planning, patient required Min question cues to identify safe and unsafe home management tasks.  Continue with current plan of care.     FIM:  Comprehension Comprehension Mode: Auditory Comprehension: 5-Follows basic conversation/direction: With no assist Expression Expression Mode: Verbal Expression: 6-Expresses complex ideas: With extra time/assistive device Social Interaction Social Interaction: 6-Interacts appropriately with others with medication or extra time (anti-anxiety, antidepressant). Problem Solving Problem Solving: 4-Solves basic 75 - 89% of the time/requires cueing 10 - 24% of the time Memory Memory: 5-Recognizes or recalls 90% of the time/requires cueing < 10% of the time General    Pain Pain Assessment Pain Assessment: No/denies pain  Therapy/Group: Individual  Therapy  Gunnar Fusi, M.A., CCC-SLP  [place] 10/05/2014, 5:12 PM

## 2014-10-06 ENCOUNTER — Inpatient Hospital Stay (HOSPITAL_COMMUNITY): Payer: 59 | Admitting: Speech Pathology

## 2014-10-06 ENCOUNTER — Inpatient Hospital Stay (HOSPITAL_COMMUNITY): Payer: 59 | Admitting: Occupational Therapy

## 2014-10-06 ENCOUNTER — Encounter (HOSPITAL_COMMUNITY): Payer: 59

## 2014-10-06 LAB — GLUCOSE, CAPILLARY
GLUCOSE-CAPILLARY: 93 mg/dL (ref 70–99)
Glucose-Capillary: 125 mg/dL — ABNORMAL HIGH (ref 70–99)
Glucose-Capillary: 159 mg/dL — ABNORMAL HIGH (ref 70–99)
Glucose-Capillary: 80 mg/dL (ref 70–99)

## 2014-10-06 NOTE — Progress Notes (Signed)
Physical Therapy Session Note  Patient Details  Name: Brian Hall MRN: [id number] Date of Birth: 01/31/1956  Today's Date: 10/06/2014 PT Co-Treatment Time:  1105-1205 (whole home eval 1020-1205 w/ OT)  Short Term Goals: Week 3:  PT Short Term Goal 1 (Week 3): Pt will perform squat pivot transfers at S level with only min cues for set up/safety.  PT Short Term Goal 2 (Week 3): Pt will perform bed mobility with HOB flat and without rails at S level with min cues for sequencing/technique.  PT Short Term Goal 3 (Week 3): Pt will propel w/c x 150' using R hemi technique with min cues for scanning L environment.  PT Short Term Goal 4 (Week 3): Pt will ambulate x 25' w/ LRAD at max A of single therapist (+2 for chair follow)  Skilled Therapeutic Interventions/Progress Updates:   Pt received sitting in w/c in room, ready for home evaluation.  Skilled co-treat with OT to pts home in order to assess home set up, safe negotiation in home, squat pivot transfers to furniture and bed with bed mobility, and determining how to maneuver in bathroom with and without stedy.  Feel he will be able to transfer to bedside commode via squat pivot, however will be more difficult to transfer to tub transfer bench due to home set up.  Also involved wife in functional transfers and use of stedy in restroom, as well as car transfer.  Pt and wife did well and will continue with education.  See paper chart for details on home eval.  Note also discussed that wife was feeling anxious prior to home eval and with continued practice at hospital, would increase her confidence with transfers.  Pts wife verbalized understanding.  Pt assisted back to room and left in w/c with all needs in reach.    Therapy Documentation Precautions:  Precautions Precautions: Fall Precaution Comments: left inattention, labile Restrictions Weight Bearing Restrictions: No   Pain: Pain Assessment Pain Assessment: No/denies pain  See FIM for  current functional status  Therapy/Group: Co-Treatment  Genni Buske, Betha Loa 10/06/2014, 3:02 PM

## 2014-10-06 NOTE — Progress Notes (Signed)
Occupational Therapy Session Note  Patient Details  Name: Brian Hall MRN: [id number] Date of Birth: 1955/10/06  Today's Date: 10/06/2014 OT Co-Treatment Time: 1020-1105 (co tx with PT 1020-1205) OT Co-Treatment Time Calculation (min): 45 min   Short Term Goals: Week 3:  OT Short Term Goal 1 (Week 3): Pt will perform STS and maintain balance with Mod A while therapist assist with clothing management for LB dressing.  OT Short Term Goal 2 (Week 3): Pt will perform toilet transfer from wheelchair to toilet/BSC with Mod A. OT Short Term Goal 3 (Week 3): Pt will demonstrate safety awareness by managing L UE during functional transfers and self care tasks independently in order to increase safety. OT Short Term Goal 4 (Week 3): Pt will demonstrate lateral lean in shower to wash buttocks with steady assist while on TTB.   Skilled Therapeutic Interventions/Progress Updates:   Upon entering the room, pt seated in wheelchair with no c/o pain and eager for home evaluation. Skilled co-treatment with PT to patients home for pt/caregiver education, home safety assessment, and functional transfers with and without use of STEDY. OT recommended removal of bath mats, throw rugs in room, and decreasing overall clutter on floor throughout home for wheelchair maneuvering. OT educated wife on safe [place] of STEDY for transfer onto TTB in walk in shower and discussed its use for clothing management and transfer onto elevated toilet seat. Also demonstrated Mod squat pivot onto shower seat but caregiver not feeling comfortable with transfer and wishing wash on EOB if STEDY not available at time of discharge. Also, educated on communication between pt and caregiver during functional transfers with them demonstrating understanding in order to decrease fall risk. Pt and his wife reporting increased understanding and confidence after practice with transfers in home environment. Pt assisted back to room at completing of  evaluation with set up for lunch tray and call bell within reach upon exiting the room.     Therapy Documentation Precautions:  Precautions Precautions: Fall Precaution Comments: left inattention, labile Restrictions Weight Bearing Restrictions: No  Pain: Pain Assessment Pain Score: 2  ADL: ADL ADL Comments: see FIM  See FIM for current functional status  Therapy/Group: Co-Treatment  [name] 10/06/2014, 8:33 PM

## 2014-10-06 NOTE — Progress Notes (Signed)
Speech Language Pathology Daily Session Note  Patient Details  Name: Brian Hall MRN: [id number] Date of Birth: 1955-09-10  Today's Date: 10/06/2014 SLP Individual Time: 0830-0900 SLP Individual Time Calculation (min): 30 min  Short Term Goals: Week 4: SLP Short Term Goal 1 (Week 4): Patient will attend to left of environment and left upper extremity during functional tasks with Supervision verbal/question cues.    SLP Short Term Goal 2 (Week 4): Patient will demonsrate moderately complex problem solving during functional tasks with Min assist. SLP Short Term Goal 3 (Week 4): Patient will alternate attention to functional tasks for 10-15 minutes with Min verbal cues for redirection.   Skilled Therapeutic Interventions: Skilled treatment session focused on addressing cognition goals.  [place] SLP arrival patient consuming breakfast with set-up assist for containers and to cut food.  SLP facilitated session with Mod multimodal cues to faciilitate attention to and use of left upper extremity with every cup sips.  Patient provided with a mug and hand over hand assist following his cued initiation.  Continue with current plan of care.   FIM:  Comprehension Comprehension Mode: Auditory Comprehension: 5-Follows basic conversation/direction: With no assist Expression Expression Mode: Verbal Expression: 6-Expresses complex ideas: With extra time/assistive device Social Interaction Social Interaction: 6-Interacts appropriately with others with medication or extra time (anti-anxiety, antidepressant). Problem Solving Problem Solving: 4-Solves basic 75 - 89% of the time/requires cueing 10 - 24% of the time Memory Memory: 5-Recognizes or recalls 90% of the time/requires cueing < 10% of the time FIM - Eating Eating Activity: 5: Set-up assist for open containers;5: Set-up assist for cut food  Pain Pain Assessment Pain Assessment: No/denies pain  Therapy/Group: Individual Therapy  Carmelia Roller., [place] [phone]  [place] 10/06/2014, 11:17 AM

## 2014-10-06 NOTE — Progress Notes (Signed)
Occupational Therapy Weekly Progress Note  Patient Details  Name: Brian Hall MRN: [id number] Date of Birth: 1955/12/12  Beginning of progress report period: September 30, 2014 End of progress report period: October 06, 2014  Today's Date: 10/06/2014 OT Individual [id number]  - [id number]   60 minutes skilled OT session   Patient has met 3 of 4 short term goals.  Pt making great progress towards goals this week and continued to be very motivated during therapy session. Pt also continues to be very emotional as well. Pt no longer utilizing stedy during bathing and dressing sessions. He is transferring onto TTB with Mod A and Min A from bed <> wheelchair. Home evaluation completed today with caregiver and patient reporting increased confidence about performing transfers at home. Caregiver wishes to purchase STEDY for home use and practiced functional transfers with its use during home evaluation.   Patient continues to demonstrate the following deficits: decreased awareness, decreased self care, decreased balance, decreased ROM and strength in L UE/LE, decreased safety, decreased independence with functional transfers and therefore will continue to benefit from skilled OT intervention to enhance overall performance with BADL.  Patient progressing toward long term goals..  Continue plan of care.  OT Short Term Goals Week 3:  OT Short Term Goal 1 (Week 3): Pt will perform STS and maintain balance with Mod A while therapist assist with clothing management for LB dressing.  OT Short Term Goal 1 - Progress (Week 3): Met OT Short Term Goal 2 (Week 3): Pt will perform toilet transfer from wheelchair to toilet/BSC with Mod A. OT Short Term Goal 2 - Progress (Week 3): Met OT Short Term Goal 3 (Week 3): Pt will demonstrate safety awareness by managing L UE during functional transfers and self care tasks independently in order to increase safety. OT Short Term Goal 3 - Progress (Week 3): Progressing toward  goal OT Short Term Goal 4 (Week 3): Pt will demonstrate lateral lean in shower to wash buttocks with steady assist while on TTB.  OT Short Term Goal 4 - Progress (Week 3): Met Week 4:  OT Short Term Goal 1 (Week 4): STG= LTGs secondary to upcoming discharge date  Skilled Therapeutic Interventions/Progress Updates:  Upon entering the room, pt supine in bed awaiting therapist with no c/o pain this session. Supine >sit with close supervision in flattened bed. Bed >wheelchair with min A squat pivot. Pt independently moving L foot for transfers during session but requiring Min verbal cues for safety awareness with L UE. Squat pivot transfer wheelchair <> TTB with Mod A. Pt completed bathing with bath mit donned on L hand and use of LH sponge to wash arm pits, R UE, and B lower legs and feet. Pt performed dressing tasks at sink with STS and Mod A for balance during clothing management. Pt seated in wheelchair with L arm tray donned and call bell within reach.   Therapy Documentation Precautions:  Precautions Precautions: Fall Precaution Comments: left inattention, labile Restrictions Weight Bearing Restrictions: No  ADL: ADL ADL Comments: see FIM  See FIM for current functional status  Therapy/Group: Individual Therapy  [name] 10/06/2014, 8:55 PM

## 2014-10-07 ENCOUNTER — Inpatient Hospital Stay (HOSPITAL_COMMUNITY): Payer: 59 | Admitting: Speech Pathology

## 2014-10-07 ENCOUNTER — Inpatient Hospital Stay (HOSPITAL_COMMUNITY): Payer: 59 | Admitting: Occupational Therapy

## 2014-10-07 ENCOUNTER — Inpatient Hospital Stay (HOSPITAL_COMMUNITY): Payer: 59 | Admitting: *Deleted

## 2014-10-07 ENCOUNTER — Inpatient Hospital Stay (HOSPITAL_COMMUNITY): Payer: 59 | Admitting: Rehabilitation

## 2014-10-07 LAB — GLUCOSE, CAPILLARY
GLUCOSE-CAPILLARY: 97 mg/dL (ref 70–99)
Glucose-Capillary: 74 mg/dL (ref 70–99)
Glucose-Capillary: 114 mg/dL — ABNORMAL HIGH (ref 70–99)
Glucose-Capillary: 105 mg/dL — ABNORMAL HIGH (ref 70–99)

## 2014-10-07 NOTE — Progress Notes (Signed)
Physical Therapy Session Note  Patient Details  Name: Brian Hall MRN: [id number] Date of Birth: April 21, 1956  Today's Date: 10/07/2014 PT Individual Time: 0830-0930 PT Individual Time Calculation (min): 60 min   Short Term Goals: Week 3:  PT Short Term Goal 1 (Week 3): Pt will perform squat pivot transfers at S level with only min cues for set up/safety.  PT Short Term Goal 2 (Week 3): Pt will perform bed mobility with HOB flat and without rails at S level with min cues for sequencing/technique.  PT Short Term Goal 3 (Week 3): Pt will propel w/c x 150' using R hemi technique with min cues for scanning L environment.  PT Short Term Goal 4 (Week 3): Pt will ambulate x 25' w/ LRAD at max A of single therapist (+2 for chair follow)  Skilled Therapeutic Interventions/Progress Updates:   Pt received sitting in w/c in room, wife present to observe and participate in family training during session.  Pt self propelled to/from therapy gym x 150' x 2 reps at S level using hemi technique.  Performed 49' gait with use of hemi walker at max A (+2 assist for use of theraband to keep trunk elevated and upright).  Continue to provide facilitate at hips and trunk for forward translation over L hip during L stance.  He continues to compensate with forward trunk movement to get LLE to advance forward, however feel it is mostly due to lack of weight shift to the R.  Progressed session to performing standing NMR task with advancing and retrostepping LLE with use of mirror in order to facilitate more upright trunk with stepping and also forward weight shift beginning at hips over LLE.  Requires max to total A in order to perform task with max verbal and demonstration cues for upright posture and sequencing.  Ended session with wife assisting pt in ADL apt with squat pivot transfers to couch and also to bed and bed mobility.  Performed transfer to couch x 3 reps with min cues to wife to cue pt on foot placement and  safety, as well as sequencing and removal and set up of w/c and parts.  Then performed w/c<>bed transfer in same manner with bed mobility.  Pt able to perform at S level, however provided cues to wife on how to assist for safety and ease of transitional movement.  Both verbalized understanding and will continue to practice.  Pt propelled back to room and left in w/c.    Therapy Documentation Precautions:  Precautions Precautions: Fall Precaution Comments: left inattention, labile Restrictions Weight Bearing Restrictions: No  Pain: Pain Assessment Pain Score: 2    Locomotion : Ambulation Ambulation/Gait Assistance: 2: Max assist;1: +2 Total assist Wheelchair Mobility Distance: 150   See FIM for current functional status  Therapy/Group: Individual Therapy  Denice Bors 10/07/2014, 10:23 AM

## 2014-10-07 NOTE — Progress Notes (Signed)
Speech Language Pathology Daily Session Note  Patient Details  Name: Brian Hall MRN: [id number] Date of Birth: 1956/07/14  Today's Date: 10/07/2014 SLP Individual Time: 1100-1130 SLP Individual Time Calculation (min): 30 min  Short Term Goals: Week 4: SLP Short Term Goal 1 (Week 4): Patient will attend to left of environment and left upper extremity during functional tasks with Supervision verbal/question cues.    SLP Short Term Goal 2 (Week 4): Patient will demonsrate moderately complex problem solving during functional tasks with Min assist. SLP Short Term Goal 3 (Week 4): Patient will alternate attention to functional tasks for 10-15 minutes with Min verbal cues for redirection.   Skilled Therapeutic Interventions: Skilled treatment session focused on addressing cognition goals.  SLP facilitated session with initial instruction cues for an alternating attention task that focused on use both upper extremities.  Patient was able to complete task for ~20 minutes with increased wait time to self-monitor and correct errors.  During forced use task patient was also Supervision for recall and use of left upper extremity.  Continue with current plan of care.   FIM:  Comprehension Comprehension Mode: Auditory Comprehension: 5-Follows basic conversation/direction: With no assist Expression Expression Mode: Verbal Expression: 6-Expresses complex ideas: With extra time/assistive device Social Interaction Social Interaction: 6-Interacts appropriately with others with medication or extra time (anti-anxiety, antidepressant). Problem Solving Problem Solving: 5-Solves basic 90% of the time/requires cueing < 10% of the time Memory Memory: 5-Requires cues to use assistive device  Pain Pain Assessment Pain Assessment: No/denies pain  Therapy/Group: Individual Therapy  Carmelia Roller., [place] [phone]  [place] 10/07/2014, 12:43 PM

## 2014-10-07 NOTE — Progress Notes (Signed)
Physical Therapy Session Note  Patient Details  Name: Brian Hall MRN: [id number] Date of Birth: 1956-04-13  Today's Date: 10/07/2014 PT Individual Time: 1400-1430 PT Individual Time Calculation (min): 30 min   Short Term Goals: Week 3:  PT Short Term Goal 1 (Week 3): Pt will perform squat pivot transfers at S level with only min cues for set up/safety.  PT Short Term Goal 2 (Week 3): Pt will perform bed mobility with HOB flat and without rails at S level with min cues for sequencing/technique.  PT Short Term Goal 3 (Week 3): Pt will propel w/c x 150' using R hemi technique with min cues for scanning L environment.  PT Short Term Goal 4 (Week 3): Pt will ambulate x 25' w/ LRAD at max A of single therapist (+2 for chair follow)  Skilled Therapeutic Interventions/Progress Updates:   Pt received sitting in w/c in room, agreeable to therapy session.  Wife did not attend session, but discussed family training tomorrow during session.  Skilled session focused on tall kneeling activity for NMR through LLE, upright posture with increased glute activation, and weight shift to the R to decrease pusher tendencies.  PT provided assist posteriorly to pt at trunk for upright posture and at glutes for increased hip extension and weight shift, esp when reaching to the L for increased WB through LLE.  Tolerated well, but continues to require heavy assist to maintain upright posture.  Ended session with seated nustep x 4 mins with BLEs only to increase NMR through LLE with hip ext, knee ext and hip add to maintain straight alignment.  Tolerated well with intermittent assist for hip add.  Transferred back to w/c at S level.  Assisted back to room and left in w/c with all needs in reach.   Therapy Documentation Precautions:  Precautions Precautions: Fall Precaution Comments: left inattention, labile Restrictions Weight Bearing Restrictions: No  Pain: Pain Assessment Pain Assessment: No/denies pain  See  FIM for current functional status  Therapy/Group: Individual Therapy  Denice Bors 10/07/2014, 3:19 PM

## 2014-10-07 NOTE — Progress Notes (Signed)
Occupational Therapy Session Note  Patient Details  Name: MAZE CORNIEL MRN: [id number] Date of Birth: March 04, 1956  Today's Date: 10/07/2014 OT Individual Time: 3435-6861 OT Individual Time Calculation (min): 60 min    Short Term Goals: Week 4:  OT Short Term Goal 1 (Week 4): STG= LTGs secondary to upcoming discharge date  Skilled Therapeutic Interventions/Progress Updates:  Upon entering the room, pt supine in bed with no c/o pain this session. Supine >sit with close supervision without bed rails and flattened bed. Pt transfer from bed>wheelchair with min A squat pivot and propelled self to bathroom. OT assisting with set up of wheelchair for transfer and pt locking breaks. Transfer from wheelchair >TTB with Mod A squat pivot and adjusting L foot placement independently as well as L UE awareness during transfer. Caregiver arrived for education during the rest of session. Pt performed bathing with bath mit and LH sponge with Min A to wash buttocks. OT continued education with STEDY for transfer with caregiver. She performed set up of stedy for pt transfer from TTB with supervision from therapist. Pt and wife communicating appropriately to increase safety during transfer. Grooming and UB dressing performed at sink side while seated in STEDY as pt will be doing at home with caregiver supervision. Pt returned to wheelchair and STS at sink with Mod A from wife. Once standing , wife assisted with clothing management as pt utilized Geologist, engineering for balance. Pt seated in wheelchair with L arm tray donned and call bell within reach upon exiting the room. Caregiver did wonderful job retaining information from [facility] home evaluation.   Therapy Documentation Precautions:  Precautions Precautions: Fall Precaution Comments: left inattention, labile Restrictions Weight Bearing Restrictions: No ADL: ADL ADL Comments: see FIM  See FIM for current functional status  Therapy/Group: Individual  Therapy  [name] 10/07/2014, 11:51 AM

## 2014-10-07 NOTE — Progress Notes (Signed)
Subjective/Complaints: No issues overnite,  No pains Review of systems is significant for left-sided weakness.  Objective: Vital Signs: Blood pressure 121/64, pulse 55, temperature 97.6 F (36.4 C), temperature source Oral, resp. rate 18, weight 108.6 kg (239 lb 6.7 oz), SpO2 100 %. No results found. Results for orders placed or performed during the hospital encounter of 09/16/14 (from the past 72 hour(s))  Glucose, capillary     Status: Abnormal   Collection Time: 10/04/14 11:55 AM  Result Value Ref Range   Glucose-Capillary 105 (H) 70 - 99 mg/dL  Glucose, capillary     Status: Abnormal   Collection Time: 10/04/14  4:48 PM  Result Value Ref Range   Glucose-Capillary 100 (H) 70 - 99 mg/dL  Glucose, capillary     Status: Abnormal   Collection Time: 10/04/14  8:57 PM  Result Value Ref Range   Glucose-Capillary 103 (H) 70 - 99 mg/dL  Glucose, capillary     Status: None   Collection Time: 10/05/14  6:49 AM  Result Value Ref Range   Glucose-Capillary 95 70 - 99 mg/dL  Glucose, capillary     Status: Abnormal   Collection Time: 10/05/14 11:23 AM  Result Value Ref Range   Glucose-Capillary 128 (H) 70 - 99 mg/dL   Comment 1 Notify RN   Glucose, capillary     Status: Abnormal   Collection Time: 10/05/14  4:18 PM  Result Value Ref Range   Glucose-Capillary 104 (H) 70 - 99 mg/dL   Comment 1 Notify RN   Glucose, capillary     Status: Abnormal   Collection Time: 10/05/14  8:27 PM  Result Value Ref Range   Glucose-Capillary 156 (H) 70 - 99 mg/dL  Glucose, capillary     Status: Abnormal   Collection Time: 10/06/14  8:29 AM  Result Value Ref Range   Glucose-Capillary 125 (H) 70 - 99 mg/dL  Glucose, capillary     Status: None   Collection Time: 10/06/14 12:57 PM  Result Value Ref Range   Glucose-Capillary 93 70 - 99 mg/dL  Glucose, capillary     Status: Abnormal   Collection Time: 10/06/14  4:40 PM  Result Value Ref Range   Glucose-Capillary 159 (H) 70 - 99 mg/dL  Glucose,  capillary     Status: None   Collection Time: 10/06/14  9:08 PM  Result Value Ref Range   Glucose-Capillary 80 70 - 99 mg/dL  Glucose, capillary     Status: None   Collection Time: 10/07/14  6:46 AM  Result Value Ref Range   Glucose-Capillary 74 70 - 99 mg/dL      Extremity:  No Edema  Neuro: Alert/Oriented, Cranial Nerve Abnormalities left central 7, Abnormal Sensory decreased sensation to light touch left upper and left lower extremity, Abnormal Motor 2/5 Left shoulder Add, 2 minus left biceps,0/5 finger ext/flex  3 minus/5 in hip knee extensor synergy left lower extremity 0/5 at the ankle  Musc/Skel:  Other no pain with left shoulder or left lower extremity range of motion Gen.no acute distress Mood and affect appropriate  Assessment/Plan: 1. Functional deficits secondary to right MCA infarct which require 3+ hours per day of interdisciplinary therapy in a comprehensive inpatient rehab setting. Physiatrist is providing close team supervision and 24 hour management of active medical problems listed below. Physiatrist and rehab team continue to assess barriers to discharge/monitor patient progress toward functional and medical goals.  FIM: FIM - Bathing Bathing Steps Patient Completed: Chest, Right Arm, Abdomen, Front perineal  area, Right upper leg, Left upper leg, Right lower leg (including foot), Left lower leg (including foot), Left Arm Bathing: 4: Min-Patient completes 8-9 71f 10 parts or 75+ percent  FIM - Upper Body Dressing/Undressing Upper body dressing/undressing steps patient completed: Pull shirt over trunk, Thread/unthread left sleeve of pullover shirt/dress, Put head through opening of pull over shirt/dress, Thread/unthread right sleeve of pullover shirt/dresss Upper body dressing/undressing: 5: Set-up assist to: Obtain clothing/put away FIM - Lower Body Dressing/Undressing Lower body dressing/undressing steps patient completed: Thread/unthread right underwear leg,  Thread/unthread left underwear leg, Thread/unthread right pants leg, Thread/unthread left pants leg, Don/Doff right shoe Lower body dressing/undressing: 3: Mod-Patient completed 50-74% of tasks  FIM - Toileting Toileting steps completed by patient: Adjust clothing prior to toileting, Performs perineal hygiene Toileting Assistive Devices: Grab bar or rail for support Toileting: 3: Mod-Patient completed 2 of 3 steps  FIM - Radio producer Devices: Elevated toilet seat Toilet Transfers: 1-Mechanical lift  FIM - Control and instrumentation engineer Devices: Arm rests Bed/Chair Transfer: 5: Sit > Supine: Supervision (verbal cues/safety issues), 4: Bed > Chair or W/C: Min A (steadying Pt. > 75%)  FIM - Locomotion: Wheelchair Distance: 150 Locomotion: Wheelchair: 5: Travels 150 ft or more: maneuvers on rugs and over door sills with supervision, cueing or coaxing FIM - Locomotion: Ambulation Locomotion: Ambulation Assistive Devices: Museum/gallery curator Ambulation/Gait Assistance: 2: Max assist, 1: +2 Total assist (chair follow) Locomotion: Ambulation: 1: Two helpers  Comprehension Comprehension Mode: Auditory Comprehension: 5-Follows basic conversation/direction: With no assist  Expression Expression Mode: Verbal Expression: 6-Expresses complex ideas: With extra time/assistive device  Social Interaction Social Interaction: 6-Interacts appropriately with others with medication or extra time (anti-anxiety, antidepressant).  Problem Solving Problem Solving Mode: Asleep Problem Solving: 4-Solves basic 75 - 89% of the time/requires cueing 10 - 24% of the time  Memory Memory: 5-Recognizes or recalls 90% of the time/requires cueing < 10% of the time  Medical Problem List and Plan:  1. 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 DVT Prophylaxis/Anticoagulation: SQ heparin .Monitor platelet counts and any signs of  DVT 3. Pain Management: tylenol as needed 4. Diabetes Mellitus uncontrolledwith peripheral neuropathy.HGB A1C 7.7.SSI. Controlled on Tradjenta 5mg , Lantus 10U qhs and  amaryl 2mg  daily     5. Neuropsych: This patient is capable of making decisions on his own behalf. 6. Skin/Wound Care: Routine skin checks 7. Fluids/Electrolytes/Nutrition:strict I&O.Follow up labs 8.Hypertension. Lisinopril discontinued 09/16/2014 secondary to mild elevation in creatinine to 1.85..(Patient had also been on Coreg 12.5 mg twice a day prior to admission and lisinopril 40 mg daily) Monitor with increased mobility and resume as needed 9. History throat cancer. Status post dissection with radiation chemotherapy 2011 10.Hypothyroidism.Synthroid 11.Diastolic CHF. Monitor for any signs of fluid overload 12.Hyperlipidemia. Crestor 13.GERD.Protonix  LOS (Days) 21 A FACE TO FACE EVALUATION WAS PERFORMED  Brian Hall E 10/07/2014, 8:55 AM

## 2014-10-08 ENCOUNTER — Inpatient Hospital Stay (HOSPITAL_COMMUNITY): Payer: 59 | Admitting: Occupational Therapy

## 2014-10-08 LAB — GLUCOSE, CAPILLARY
GLUCOSE-CAPILLARY: 112 mg/dL — AB (ref 70–99)
GLUCOSE-CAPILLARY: 109 mg/dL — AB (ref 70–99)
Glucose-Capillary: 89 mg/dL (ref 70–99)
Glucose-Capillary: 103 mg/dL — ABNORMAL HIGH (ref 70–99)
Glucose-Capillary: 80 mg/dL (ref 70–99)

## 2014-10-08 NOTE — Progress Notes (Signed)
Occupational Therapy Session Note  Patient Details  Name: Brian Hall MRN: [id number] Date of Birth: July 04, 1956  Today's Date: 10/08/2014 OT Individual Time: 1025-8527 OT Individual Time Calculation (min): 60 min    Short Term Goals: Week 3:  Week 4:  OT Short Term Goal 1 (Week 4): STG= LTGs secondary to upcoming discharge date  Skilled Therapeutic Interventions/Progress Updates:  Upon entering the room, pt supine in bed with head elevated and RN present in the room. Pt with 0/10 c/o pain this session. Pt visibly upset and reporting episode last night where arm began to tingle and burn and he reported, "Like when I had my stroke." RN aware of episode. Pt reports not other concerns and "feel like I have been." Supine > sit with supervision to EOB. Squat pivot transfer from bed > wheelchair with Min A to guide bottom into chair other wise pt over shooting self. Pt assisted into bathroom via wheelchair with mod A squat pivot wheelchair <> TTB. Pt requiring min verbal cues for safety awareness of L UE and foot placement for transfer. OT believes pt distracted from previous upset from last night. Pt performs bathing with Min A to wash buttocks and use of bath mit on L hand and LH sponge to wash all other parts. Dressing performed at sink side from wheelchair with Mod A STS x 3 reps. Pt able to pull up R side of pants and underwear. Pt seated in wheelchair with L arm tray donned and call bell within reach upon exiting the room.    Therapy Documentation Precautions:  Precautions Precautions: Fall Precaution Comments: left inattention, labile Restrictions Weight Bearing Restrictions: No ADL: ADL ADL Comments: see FIM  See FIM for current functional status  Therapy/Group: Individual Therapy  [name] 10/08/2014, 10:46 AM

## 2014-10-08 NOTE — Progress Notes (Signed)
Subjective/Complaints: Woke up this morning feeling tingling on both sides of his body. His blood sugar was 87 however his symptoms improved after receiving some glucose, blood sugar rechecked at 112 Patient went through OT this morning felt great Review of systems is significant for left-sided weakness.  Objective: Vital Signs: Blood pressure 132/71, pulse 50, temperature 97.8 F (36.6 C), temperature source Oral, resp. rate 18, weight 108.6 kg (239 lb 6.7 oz), SpO2 99 %. No results found. Results for orders placed or performed during the hospital encounter of 09/16/14 (from the past 72 hour(s))  Glucose, capillary     Status: Abnormal   Collection Time: 10/05/14 11:23 AM  Result Value Ref Range   Glucose-Capillary 128 (H) 70 - 99 mg/dL   Comment 1 Notify RN   Glucose, capillary     Status: Abnormal   Collection Time: 10/05/14  4:18 PM  Result Value Ref Range   Glucose-Capillary 104 (H) 70 - 99 mg/dL   Comment 1 Notify RN   Glucose, capillary     Status: Abnormal   Collection Time: 10/05/14  8:27 PM  Result Value Ref Range   Glucose-Capillary 156 (H) 70 - 99 mg/dL  Glucose, capillary     Status: Abnormal   Collection Time: 10/06/14  8:29 AM  Result Value Ref Range   Glucose-Capillary 125 (H) 70 - 99 mg/dL  Glucose, capillary     Status: None   Collection Time: 10/06/14 12:57 PM  Result Value Ref Range   Glucose-Capillary 93 70 - 99 mg/dL  Glucose, capillary     Status: Abnormal   Collection Time: 10/06/14  4:40 PM  Result Value Ref Range   Glucose-Capillary 159 (H) 70 - 99 mg/dL  Glucose, capillary     Status: None   Collection Time: 10/06/14  9:08 PM  Result Value Ref Range   Glucose-Capillary 80 70 - 99 mg/dL  Glucose, capillary     Status: None   Collection Time: 10/07/14  6:46 AM  Result Value Ref Range   Glucose-Capillary 74 70 - 99 mg/dL  Glucose, capillary     Status: Abnormal   Collection Time: 10/07/14 11:22 AM  Result Value Ref Range   Glucose-Capillary  114 (H) 70 - 99 mg/dL  Glucose, capillary     Status: Abnormal   Collection Time: 10/07/14  5:03 PM  Result Value Ref Range   Glucose-Capillary 105 (H) 70 - 99 mg/dL  Glucose, capillary     Status: None   Collection Time: 10/07/14  8:23 PM  Result Value Ref Range   Glucose-Capillary 97 70 - 99 mg/dL  Glucose, capillary     Status: None   Collection Time: 10/08/14  5:58 AM  Result Value Ref Range   Glucose-Capillary 89 70 - 99 mg/dL  Glucose, capillary     Status: Abnormal   Collection Time: 10/08/14  6:34 AM  Result Value Ref Range   Glucose-Capillary 112 (H) 70 - 99 mg/dL      Extremity:  No Edema  Neuro: Alert/Oriented, Cranial Nerve Abnormalities left central 7, Abnormal Sensory decreased sensation to light touch left upper and left lower extremity, Abnormal Motor 2/5 Left shoulder Add, 2 minus left biceps,0/5 finger ext/flex  3 minus/5 in hip knee extensor synergy left lower extremity 0/5 at the ankle  Musc/Skel:  Other no pain with left shoulder or left lower extremity range of motion Gen.no acute distress Mood and affect appropriate Lungs clear Heart regular rate and rhythm no murmurs Abdomen positive  bowel sounds soft nontender palpation  Assessment/Plan: 1. Functional deficits secondary to right MCA infarct which require 3+ hours per day of interdisciplinary therapy in a comprehensive inpatient rehab setting. Physiatrist is providing close team supervision and 24 hour management of active medical problems listed below. Physiatrist and rehab team continue to assess barriers to discharge/monitor patient progress toward functional and medical goals.  FIM: FIM - Bathing Bathing Steps Patient Completed: Chest, Right Arm, Abdomen, Front perineal area, Right upper leg, Left upper leg, Right lower leg (including foot), Left lower leg (including foot), Left Arm Bathing: 4: Min-Patient completes 8-9 21f 10 parts or 75+ percent  FIM - Upper Body Dressing/Undressing Upper body  dressing/undressing steps patient completed: Pull shirt over trunk, Thread/unthread left sleeve of pullover shirt/dress, Put head through opening of pull over shirt/dress, Thread/unthread right sleeve of pullover shirt/dresss Upper body dressing/undressing: 5: Set-up assist to: Obtain clothing/put away FIM - Lower Body Dressing/Undressing Lower body dressing/undressing steps patient completed: Thread/unthread right underwear leg, Thread/unthread left underwear leg, Thread/unthread right pants leg, Thread/unthread left pants leg, Don/Doff right shoe Lower body dressing/undressing: 3: Mod-Patient completed 50-74% of tasks  FIM - Toileting Toileting steps completed by patient: Adjust clothing prior to toileting, Performs perineal hygiene Toileting Assistive Devices: Grab bar or rail for support Toileting: 3: Mod-Patient completed 2 of 3 steps  FIM - Radio producer Devices: Elevated toilet seat Toilet Transfers: 1-Mechanical lift  FIM - Control and instrumentation engineer Devices: Arm rests Bed/Chair Transfer: 5: Supine > Sit: Supervision (verbal cues/safety issues), 4: Bed > Chair or W/C: Min A (steadying Pt. > 75%)  FIM - Locomotion: Wheelchair Distance: 150 Locomotion: Wheelchair: 5: Travels 150 ft or more: maneuvers on rugs and over door sills with supervision, cueing or coaxing FIM - Locomotion: Ambulation Locomotion: Ambulation Assistive Devices: Museum/gallery curator Ambulation/Gait Assistance: 2: Max assist, 1: +2 Total assist Locomotion: Ambulation: 1: Two helpers  Comprehension Comprehension Mode: Auditory Comprehension: 5-Understands complex 90% of the time/Cues < 10% of the time  Expression Expression Mode: Verbal Expression: 6-Expresses complex ideas: With extra time/assistive device  Social Interaction Social Interaction: 6-Interacts appropriately with others with medication or extra time (anti-anxiety, antidepressant).  Problem  Solving Problem Solving Mode: Asleep Problem Solving: 5-Solves basic 90% of the time/requires cueing < 10% of the time  Memory Memory: 5-Requires cues to use assistive device  Medical Problem List and Plan:  1. 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 DVT Prophylaxis/Anticoagulation: SQ heparin .Monitor platelet counts and any signs of DVT 3. Pain Management: tylenol as needed 4. Diabetes Mellitus uncontrolledwith peripheral neuropathy.HGB A1C 7.7.SSI. Controlled on Tradjenta 5mg , Lantus 10U qhs and  amaryl 2mg  daily     5. Neuropsych: This patient is capable of making decisions on his own behalf. 6. Skin/Wound Care: Routine skin checks 7. Fluids/Electrolytes/Nutrition:strict I&O.Follow up labs 8.Hypertension. Lisinopril discontinued 09/16/2014 secondary to mild elevation in creatinine to 1.85..(Patient had also been on Coreg 12.5 mg twice a day prior to admission and lisinopril 40 mg daily) Monitor with increased mobility and resume as needed 9. History throat cancer. Status post dissection with radiation chemotherapy 2011 10.Hypothyroidism.Synthroid 11.Diastolic CHF. Monitor for any signs of fluid overload 12.Hyperlipidemia. Crestor 13.GERD.Protonix  LOS (Days) 22 A FACE TO FACE EVALUATION WAS PERFORMED  KIRSTEINS,ANDREW E 10/08/2014, 9:47 AM

## 2014-10-09 ENCOUNTER — Inpatient Hospital Stay (HOSPITAL_COMMUNITY): Payer: 59 | Admitting: Physical Therapy

## 2014-10-09 LAB — GLUCOSE, CAPILLARY
GLUCOSE-CAPILLARY: 103 mg/dL — AB (ref 70–99)
Glucose-Capillary: 132 mg/dL — ABNORMAL HIGH (ref 70–99)
Glucose-Capillary: 106 mg/dL — ABNORMAL HIGH (ref 70–99)
Glucose-Capillary: 92 mg/dL (ref 70–99)

## 2014-10-09 NOTE — Progress Notes (Signed)
Subjective/Complaints: Patient without complaints today. Working on his exercises independently Review of systems is significant for left-sided weakness.  Objective: Vital Signs: Blood pressure 121/66, pulse 52, temperature 97.9 F (36.6 C), temperature source Oral, resp. rate 18, weight 108.6 kg (239 lb 6.7 oz), SpO2 100 %. No results found. Results for orders placed or performed during the hospital encounter of 09/16/14 (from the past 72 hour(s))  Glucose, capillary     Status: None   Collection Time: 10/06/14 12:57 PM  Result Value Ref Range   Glucose-Capillary 93 70 - 99 mg/dL  Glucose, capillary     Status: Abnormal   Collection Time: 10/06/14  4:40 PM  Result Value Ref Range   Glucose-Capillary 159 (H) 70 - 99 mg/dL  Glucose, capillary     Status: None   Collection Time: 10/06/14  9:08 PM  Result Value Ref Range   Glucose-Capillary 80 70 - 99 mg/dL  Glucose, capillary     Status: None   Collection Time: 10/07/14  6:46 AM  Result Value Ref Range   Glucose-Capillary 74 70 - 99 mg/dL  Glucose, capillary     Status: Abnormal   Collection Time: 10/07/14 11:22 AM  Result Value Ref Range   Glucose-Capillary 114 (H) 70 - 99 mg/dL  Glucose, capillary     Status: Abnormal   Collection Time: 10/07/14  5:03 PM  Result Value Ref Range   Glucose-Capillary 105 (H) 70 - 99 mg/dL  Glucose, capillary     Status: None   Collection Time: 10/07/14  8:23 PM  Result Value Ref Range   Glucose-Capillary 97 70 - 99 mg/dL  Glucose, capillary     Status: None   Collection Time: 10/08/14  5:58 AM  Result Value Ref Range   Glucose-Capillary 89 70 - 99 mg/dL  Glucose, capillary     Status: Abnormal   Collection Time: 10/08/14  6:34 AM  Result Value Ref Range   Glucose-Capillary 112 (H) 70 - 99 mg/dL  Glucose, capillary     Status: Abnormal   Collection Time: 10/08/14 11:35 AM  Result Value Ref Range   Glucose-Capillary 109 (H) 70 - 99 mg/dL  Glucose, capillary     Status: Abnormal   Collection Time: 10/08/14  4:37 PM  Result Value Ref Range   Glucose-Capillary 103 (H) 70 - 99 mg/dL  Glucose, capillary     Status: None   Collection Time: 10/08/14  9:13 PM  Result Value Ref Range   Glucose-Capillary 80 70 - 99 mg/dL  Glucose, capillary     Status: Abnormal   Collection Time: 10/09/14  7:13 AM  Result Value Ref Range   Glucose-Capillary 103 (H) 70 - 99 mg/dL      Extremity:  No Edema  Neuro: Alert/Oriented, Cranial Nerve Abnormalities left central 7, Abnormal Sensory decreased sensation to light touch left upper and left lower extremity, Abnormal Motor 2/5 Left shoulder Add, 2 minus left biceps,0/5 finger ext/flex  3 minus/5 in hip knee extensor synergy left lower extremity 0/5 at the ankle  Musc/Skel:  Other no pain with left shoulder or left lower extremity range of motion Gen.no acute distress Mood and affect appropriate Lungs clear Heart regular rate and rhythm no murmurs Abdomen positive bowel sounds soft nontender palpation  Assessment/Plan: 1. Functional deficits secondary to right MCA infarct which require 3+ hours per day of interdisciplinary therapy in a comprehensive inpatient rehab setting. Physiatrist is providing close team supervision and 24 hour management of active medical problems listed  below. Physiatrist and rehab team continue to assess barriers to discharge/monitor patient progress toward functional and medical goals.  FIM: FIM - Bathing Bathing Steps Patient Completed: Chest, Right Arm, Abdomen, Front perineal area, Right upper leg, Left upper leg, Right lower leg (including foot), Left lower leg (including foot), Left Arm Bathing: 4: Min-Patient completes 8-9 10f 10 parts or 75+ percent  FIM - Upper Body Dressing/Undressing Upper body dressing/undressing steps patient completed: Pull shirt over trunk, Thread/unthread left sleeve of pullover shirt/dress, Put head through opening of pull over shirt/dress, Thread/unthread right sleeve of  pullover shirt/dresss Upper body dressing/undressing: 5: Supervision: Safety issues/verbal cues FIM - Lower Body Dressing/Undressing Lower body dressing/undressing steps patient completed: Thread/unthread right underwear leg, Thread/unthread left underwear leg, Thread/unthread right pants leg, Thread/unthread left pants leg, Don/Doff right shoe Lower body dressing/undressing: 3: Mod-Patient completed 50-74% of tasks  FIM - Toileting Toileting steps completed by patient: Adjust clothing prior to toileting, Performs perineal hygiene Toileting Assistive Devices: Grab bar or rail for support Toileting: 3: Mod-Patient completed 2 of 3 steps  FIM - Radio producer Devices: Elevated toilet seat Toilet Transfers: 1-Mechanical lift  FIM - Control and instrumentation engineer Devices: Arm rests Bed/Chair Transfer: 5: Supine > Sit: Supervision (verbal cues/safety issues), 4: Bed > Chair or W/C: Min A (steadying Pt. > 75%)  FIM - Locomotion: Wheelchair Distance: 150 Locomotion: Wheelchair: 5: Travels 150 ft or more: maneuvers on rugs and over door sills with supervision, cueing or coaxing FIM - Locomotion: Ambulation Locomotion: Ambulation Assistive Devices: Museum/gallery curator Ambulation/Gait Assistance: 2: Max assist, 1: +2 Total assist Locomotion: Ambulation: 1: Two helpers  Comprehension Comprehension Mode: Auditory Comprehension: 5-Understands complex 90% of the time/Cues < 10% of the time  Expression Expression Mode: Verbal Expression: 6-Expresses complex ideas: With extra time/assistive device  Social Interaction Social Interaction: 7-Interacts appropriately with others - No medications needed.  Problem Solving Problem Solving Mode: Asleep Problem Solving: 5-Solves basic 90% of the time/requires cueing < 10% of the time  Memory Memory: 5-Requires cues to use assistive device  Medical Problem List and Plan:  1. 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 DVT Prophylaxis/Anticoagulation: SQ heparin .Monitor platelet counts and any signs of DVT 3. Pain Management: tylenol as needed 4. Diabetes Mellitus controlled with peripheral neuropathy.HGB A1C 7.7.SSI. Controlled on Tradjenta 5mg , Lantus 10U qhs and  amaryl 2mg  daily     5. Neuropsych: This patient is capable of making decisions on his own behalf. 6. Skin/Wound Care: Routine skin checks 7. Fluids/Electrolytes/Nutrition:strict I&O.Follow up labs 8.Hypertension. Lisinopril discontinued 09/16/2014 secondary to mild elevation in creatinine to 1.85..(Patient had also been on Coreg 12.5 mg twice a day prior to admission and lisinopril 40 mg daily) Monitor with increased mobility and resume as needed 9. History throat cancer. Status post dissection with radiation chemotherapy 2011 10.Hypothyroidism.Synthroid 11.Diastolic CHF. Monitor for any signs of fluid overload 12.Hyperlipidemia. Crestor 13.GERD.Protonix  LOS (Days) 23 A FACE TO FACE EVALUATION WAS PERFORMED  KIRSTEINS,ANDREW E 10/09/2014, 10:30 AM

## 2014-10-09 NOTE — Progress Notes (Signed)
Physical Therapy Session Note  Patient Details  Name: Brian Hall MRN: [id number] Date of Birth: 26-Apr-1956  Today's Date: 10/09/2014 PT Individual Time: 1400-1500 PT Individual Time Calculation (min): 60 min   Short Term Goals: Week 1:  PT Short Term Goal 1 (Week 1): Pt will transfer supine to edge of bed, edgeof bed to supine with min A.  PT Short Term Goal 1 - Progress (Week 1): Partly met PT Short Term Goal 2 (Week 1): Pt will perform stand pivot bed to chair, chair to bed transfers with min A.  PT Short Term Goal 2 - Progress (Week 1): Partly met PT Short Term Goal 3 (Week 1): Pt will ambulate with LRAD about 25 feet with mod A.  PT Short Term Goal 3 - Progress (Week 1): Not met PT Short Term Goal 4 (Week 1): Pt will ascend/descend 2 stairs with 1 rail and max A.  PT Short Term Goal 4 - Progress (Week 1): Not met PT Short Term Goal 5 (Week 1): Pt will propel w/c about 150 feet with S.  PT Short Term Goal 5 - Progress (Week 1): Met Week 2:  PT Short Term Goal 1 (Week 2): pt will perform basic transfer with min assist to R and L. PT Short Term Goal 1 - Progress (Week 2): Met PT Short Term Goal 2 (Week 2): pt will perform gait x 25' with LRAD and assist of 1 person PT Short Term Goal 2 - Progress (Week 2): Progressing toward goal PT Short Term Goal 3 (Week 2): pt will ascend/descend 2 stairs 1 rail max assist PT Short Term Goal 3 - Progress (Week 2): Progressing toward goal PT Short Term Goal 4 (Week 2): pt will move sit> supine with min assist. PT Short Term Goal 4 - Progress (Week 2): Met Week 3:  PT Short Term Goal 1 (Week 3): Pt will perform squat pivot transfers at S level with only min cues for set up/safety.  PT Short Term Goal 2 (Week 3): Pt will perform bed mobility with HOB flat and without rails at S level with min cues for sequencing/technique.  PT Short Term Goal 3 (Week 3): Pt will propel w/c x 150' using R hemi technique with min cues for scanning L environment.   PT Short Term Goal 4 (Week 3): Pt will ambulate x 25' w/ LRAD at max A of single therapist (+2 for chair follow) Skilled Therapeutic Interventions/Progress Updates:    Pt most limited in session by limited L lumbar lateral flexion. Pt with easier time being facilitated in standing into neutral spine post sitting RLE marching and neutral spine facilitation. Pt demonstrates improvement initiating gait without AD. Pt with subjective improvement s/p scap mobs in session. Pt would continue to benefit from skilled PT services to increase functional mobility.  Therapy Documentation Precautions:  Precautions Precautions: Fall Precaution Comments: left inattention, labile Restrictions Weight Bearing Restrictions: No Vital Signs: Therapy Vitals Temp: 97.7 F (36.5 C) Temp Source: Oral Pulse Rate: 78 Resp: 16 BP: 109/77 mmHg Patient Position (if appropriate): Sitting Oxygen Therapy SpO2: 97 % O2 Device: Not Delivered Mobility:  Mod A for transfers with cues for weight shift, posture Locomotion : Ambulation Ambulation/Gait Assistance: 2: Max assist;1: +2 Total assist (+2 for wheelchair follow for 6') with cues for neutral spine, weight shift, and sequencing with carpal hold grip Wheelchair Mobility Distance: 150  Other Treatments:  T/S and L/S soft tissue stretches into ext and lateral flexion 3x30". Scap mobs performed  in all directions. Pt educated on rehab plan and safety in mobility. Forced use with LUE in weight bearing in standing and sitting reaching laterally and superiolaterally 2x10 for each combo of positions. Seated shoulder flexion 90 degrees vs wall with facilitation of triceps reaching anteriorly and superiorly 2x10. BLE advancement pre-gait 3x10. Transfers with carpal hold 2x10 in session. Pt performs static standing with facilitation of neutral spine. Sitting with facilitation of neutral spine and LLE marching. See FIM for current functional status  Therapy/Group: Individual  Therapy  Monia Pouch 10/09/2014, 4:06 PM

## 2014-10-10 ENCOUNTER — Inpatient Hospital Stay (HOSPITAL_COMMUNITY): Payer: 59 | Admitting: Occupational Therapy

## 2014-10-10 ENCOUNTER — Inpatient Hospital Stay (HOSPITAL_COMMUNITY): Payer: 59 | Admitting: Speech Pathology

## 2014-10-10 ENCOUNTER — Inpatient Hospital Stay (HOSPITAL_COMMUNITY): Payer: 59

## 2014-10-10 LAB — GLUCOSE, CAPILLARY: Glucose-Capillary: 99 mg/dL (ref 70–99)

## 2014-10-10 NOTE — Significant Event (Signed)
Hypoglycemic Event  CBG: 59  Treatment: 15 GM carbohydrate snack  Symptoms: None  Follow-up CBG: Time:1745 CBG Result:95  Possible Reasons for Event: Inadequate meal intake  Comments/MD notified:pt only had one carb at lunch with CBG covered for 2 units; had drink afternoon however not enough carb intake with meal.    Brian Hall B  Remember to initiate Hypoglycemia Order Set & complete

## 2014-10-10 NOTE — Progress Notes (Signed)
Speech Language Pathology Daily Session Note  Patient Details  Name: Brian Hall MRN: [id number] Date of Birth: [birth year]  Today's Date: 10/10/2014 SLP Individual Time: 1400-1500 SLP Individual Time Calculation (min): 60 min  Short Term Goals: Week 4: SLP Short Term Goal 1 (Week 4): Patient will attend to left of environment and left upper extremity during functional tasks with Supervision verbal/question cues.    SLP Short Term Goal 2 (Week 4): Patient will demonsrate moderately complex problem solving during functional tasks with Min assist. SLP Short Term Goal 3 (Week 4): Patient will alternate attention to functional tasks for 10-15 minutes with Min verbal cues for redirection.   Skilled Therapeutic Interventions: Skilled treatment session focused on addressing cognition goals.  SLP facilitated session with Mod instruction cues for an alternating attention task.  Patient was able to complete task for ~30 minutes with Supervision question cues to self-monitor and correct errors.  SLP also facilitated session with a bilateral upper extremity task that forced use of the left upper extremity as a stabilizer with Min question cues for recall and use of left upper extremity throughout task.  Patient also requested help as needed during task with Mod faded to Min question cues.  Continue with current plan of care.   FIM:  Comprehension Comprehension Mode: Auditory Comprehension: 5-Understands complex 90% of the time/Cues < 10% of the time Expression Expression Mode: Verbal Expression: 6-Expresses complex ideas: With extra time/assistive device Social Interaction Social Interaction: 6-Interacts appropriately with others with medication or extra time (anti-anxiety, antidepressant). Problem Solving Problem Solving: 5-Solves basic problems: With no assist Memory Memory: 5-Recognizes or recalls 90% of the time/requires cueing < 10% of the time  Pain Pain Assessment Pain Assessment:  No/denies pain Pain Score: 1   Therapy/Group: Individual Therapy  Carmelia Roller., [place]  [place] 10/10/2014, 4:42 PM

## 2014-10-10 NOTE — Plan of Care (Signed)
Problem: RH Ambulation Goal: LTG Patient will ambulate in controlled environment (PT) LTG: Patient will ambulate in a controlled environment, # of feet with assistance (PT).  Downgraded due to lack of progress with trunk control, perceptual deficits.

## 2014-10-10 NOTE — Progress Notes (Signed)
Physical Therapy Weekly Progress Note  Patient Details  Name: Brian Hall MRN: [id number] Date of Birth: 1955-09-17  Beginning of progress report period: October 03, 2013 End of progress report period: October 10, 2013   Patient has met 4 of 4 short term goals.  Pt continues to make good progress with all aspects of mobility and requires less cuing for set up and safety.  Note home evaluation performed and have began hands on training with wife.  Both feel they are better prepared for DC home.  Continues to be limited by decreased trunk control and balance in standing and perceptual deficits, causing decreased weight shift to the R during gait.    Patient continues to demonstrate the following deficits: decreased balance, decreased safety, decreased functional use of LUE/LE, decreased memory and therefore will continue to benefit from skilled PT intervention to enhance overall performance with activity tolerance, balance, postural control, ability to compensate for deficits, functional use of  left upper extremity and left lower extremity, attention, awareness, coordination and knowledge of precautions.  Patient progressing toward long term goals..  Plan of care revisions: Downgraded gait goal to max A due to lack of progress.  .  PT Short Term Goals Week 3:  PT Short Term Goal 1 (Week 3): Pt will perform squat pivot transfers at S level with only min cues for set up/safety.  PT Short Term Goal 1 - Progress (Week 3): Met PT Short Term Goal 2 (Week 3): Pt will perform bed mobility with HOB flat and without rails at S level with min cues for sequencing/technique.  PT Short Term Goal 2 - Progress (Week 3): Met PT Short Term Goal 3 (Week 3): Pt will propel w/c x 150' using R hemi technique with min cues for scanning L environment.  PT Short Term Goal 3 - Progress (Week 3): Met PT Short Term Goal 4 (Week 3): Pt will ambulate x 25' w/ LRAD at max A of single therapist (+2 for chair follow) PT  Short Term Goal 4 - Progress (Week 3): Met Week 4:  PT Short Term Goal 1 (Week 4): =LTG's due to ELOS  Skilled Therapeutic Interventions/Progress Updates:   See previous notes.   Therapy Documentation Precautions:  Precautions Precautions: Fall Precaution Comments: left inattention, labile Restrictions Weight Bearing Restrictions: No  Pain: Pain Assessment Pain Assessment: 0-10 Pain Score: 1  Pain Location: Back Pain Orientation: Lower;Left Pain Descriptors / Indicators: Shooting;Stabbing Pain Onset: On-going Patients Stated Pain Goal: 1 Pain Intervention(s): Medication (See eMAR)   Cameron Sprang, PT

## 2014-10-10 NOTE — Progress Notes (Signed)
Occupational Therapy Session Note  Patient Details  Name: Brian Hall MRN: [id number] Date of Birth: [birth year]  Today's Date: 10/10/2014 OT Individual Time: 1300-1400 OT Individual Time Calculation (min): 60 min    Short Term Goals: Week 4:  OT Short Term Goal 1 (Week 4): STG= LTGs secondary to upcoming discharge date  Skilled Therapeutic Interventions/Progress Updates:    Engaged in ADL retraining with focus on hemi-dressing technique, transfers, and LUE NMR.  Pt received seated in w/c and requested to only complete UB bathing and dressing at sink this session.  Pt demonstrating appropriate compensatory strategies to wash RUE.  Demonstrated difficulty with problem solving with donning shirt this session requiring increased time due to multiple errors with attempting to thread LUE through head hole and through outside of sleeve.  Pt able to complete with mod verbal cues and increased time.  Therapist demonstrated an alternative hemi-dressing technique to provide pt with options when completing UB dressing.  Engaged in WB through LUE in sitting on edge of therapy mat with discussion of WB at EOB prior to getting OOB when home to increase PROM in hand and finger and decrease tone.  Educated pt on AAROM towel glides to promote LUE return.  Therapy Documentation Precautions:  Precautions Precautions: Fall Precaution Comments: left inattention, labile Restrictions Weight Bearing Restrictions: No Pain: Pain Assessment Pain Assessment: 0-10 Pain Score: 1  Pain Location: Back Pain Orientation: Lower;Left Pain Descriptors / Indicators: Shooting;Stabbing Pain Onset: On-going Patients Stated Pain Goal: 1 Pain Intervention(s): Medication (See eMAR) ADL: ADL ADL Comments: see FIM  See FIM for current functional status  Therapy/Group: Individual Therapy  Simonne Come 10/10/2014, 3:08 PM

## 2014-10-10 NOTE — Progress Notes (Signed)
Subjective/Complaints: Occ lability, no pain , increasing tone in Left finger flexors Review of systems is significant for left-sided weakness.  Objective: Vital Signs: Blood pressure 116/59, pulse 52, temperature 98.2 F (36.8 C), temperature source Oral, resp. rate 22, weight 108.6 kg (239 lb 6.7 oz), SpO2 99 %. No results found. Results for orders placed or performed during the hospital encounter of 09/16/14 (from the past 72 hour(s))  Glucose, capillary     Status: Abnormal   Collection Time: 10/07/14 11:22 AM  Result Value Ref Range   Glucose-Capillary 114 (H) 70 - 99 mg/dL  Glucose, capillary     Status: Abnormal   Collection Time: 10/07/14  5:03 PM  Result Value Ref Range   Glucose-Capillary 105 (H) 70 - 99 mg/dL  Glucose, capillary     Status: None   Collection Time: 10/07/14  8:23 PM  Result Value Ref Range   Glucose-Capillary 97 70 - 99 mg/dL  Glucose, capillary     Status: None   Collection Time: 10/08/14  5:58 AM  Result Value Ref Range   Glucose-Capillary 89 70 - 99 mg/dL  Glucose, capillary     Status: Abnormal   Collection Time: 10/08/14  6:34 AM  Result Value Ref Range   Glucose-Capillary 112 (H) 70 - 99 mg/dL  Glucose, capillary     Status: Abnormal   Collection Time: 10/08/14 11:35 AM  Result Value Ref Range   Glucose-Capillary 109 (H) 70 - 99 mg/dL  Glucose, capillary     Status: Abnormal   Collection Time: 10/08/14  4:37 PM  Result Value Ref Range   Glucose-Capillary 103 (H) 70 - 99 mg/dL  Glucose, capillary     Status: None   Collection Time: 10/08/14  9:13 PM  Result Value Ref Range   Glucose-Capillary 80 70 - 99 mg/dL  Glucose, capillary     Status: Abnormal   Collection Time: 10/09/14  7:13 AM  Result Value Ref Range   Glucose-Capillary 103 (H) 70 - 99 mg/dL  Glucose, capillary     Status: Abnormal   Collection Time: 10/09/14 11:40 AM  Result Value Ref Range   Glucose-Capillary 132 (H) 70 - 99 mg/dL  Glucose, capillary     Status: Abnormal    Collection Time: 10/09/14  4:28 PM  Result Value Ref Range   Glucose-Capillary 106 (H) 70 - 99 mg/dL  Glucose, capillary     Status: None   Collection Time: 10/09/14  9:11 PM  Result Value Ref Range   Glucose-Capillary 92 70 - 99 mg/dL   Comment 1 Notify RN   Glucose, capillary     Status: None   Collection Time: 10/10/14  7:33 AM  Result Value Ref Range   Glucose-Capillary 99 70 - 99 mg/dL      Extremity:  No Edema  Neuro: Alert/Oriented, Cranial Nerve Abnormalities left central 7, Abnormal Sensory decreased sensation to light touch left upper and left lower extremity, Abnormal Motor 2/5 Left shoulder Add, 2 minus left biceps,0/5 finger ext/flex  3 minus/5 in hip knee extensor synergy left lower extremity 0/5 at the ankle  Musc/Skel:  Other no pain with left shoulder or left lower extremity range of motion Gen.no acute distress Mood and affect appropriate Lungs clear Heart regular rate and rhythm no murmurs Abdomen positive bowel sounds soft nontender palpation  Assessment/Plan: 1. Functional deficits secondary to right MCA infarct which require 3+ hours per day of interdisciplinary therapy in a comprehensive inpatient rehab setting. Physiatrist is providing close  team supervision and 24 hour management of active medical problems listed below. Physiatrist and rehab team continue to assess barriers to discharge/monitor patient progress toward functional and medical goals.  FIM: FIM - Bathing Bathing Steps Patient Completed: Chest, Right Arm, Abdomen, Front perineal area, Right upper leg, Left upper leg, Right lower leg (including foot), Left lower leg (including foot), Left Arm Bathing: 4: Min-Patient completes 8-9 27f 10 parts or 75+ percent  FIM - Upper Body Dressing/Undressing Upper body dressing/undressing steps patient completed: Pull shirt over trunk, Thread/unthread left sleeve of pullover shirt/dress, Put head through opening of pull over shirt/dress, Thread/unthread  right sleeve of pullover shirt/dresss Upper body dressing/undressing: 5: Supervision: Safety issues/verbal cues FIM - Lower Body Dressing/Undressing Lower body dressing/undressing steps patient completed: Thread/unthread right underwear leg, Thread/unthread left underwear leg, Thread/unthread right pants leg, Thread/unthread left pants leg, Don/Doff right shoe Lower body dressing/undressing: 3: Mod-Patient completed 50-74% of tasks  FIM - Toileting Toileting steps completed by patient: Adjust clothing prior to toileting, Performs perineal hygiene Toileting Assistive Devices: Grab bar or rail for support Toileting: 3: Mod-Patient completed 2 of 3 steps  FIM - Radio producer Devices: Elevated toilet seat Toilet Transfers: 1-Mechanical lift  FIM - Control and instrumentation engineer Devices: Arm rests Bed/Chair Transfer: 3: Bed > Chair or W/C: Mod A (lift or lower assist), 3: Chair or W/C > Bed: Mod A (lift or lower assist)  FIM - Locomotion: Wheelchair Distance: 150 Locomotion: Wheelchair: 5: Travels 50 - 149 ft, turns around, maneuvers to table, bed or toilet, negotiates 3% grade: modified independent FIM - Locomotion: Ambulation Locomotion: Ambulation Assistive Devices:  (None, hands in NDT carpal hold) Ambulation/Gait Assistance: 2: Max assist, 1: +2 Total assist (+2 for wheelchair follow for 6') Locomotion: Ambulation: 1: Travels less than 50 ft with maximal assistance (Pt: 25 - 49%)  Comprehension Comprehension Mode: Auditory Comprehension: 5-Understands complex 90% of the time/Cues < 10% of the time  Expression Expression Mode: Verbal Expression: 6-Expresses complex ideas: With extra time/assistive device  Social Interaction Social Interaction: 6-Interacts appropriately with others with medication or extra time (anti-anxiety, antidepressant).  Problem Solving Problem Solving Mode: Asleep Problem Solving: 5-Solves basic 90% of the  time/requires cueing < 10% of the time  Memory Memory: 5-Requires cues to use assistive device  Medical Problem List and Plan:  1. 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 DVT Prophylaxis/Anticoagulation: SQ heparin .Monitor platelet counts and any signs of DVT 3. Pain Management: tylenol as needed 4. Diabetes Mellitus controlled with peripheral neuropathy.HGB A1C 7.7.SSI. Controlled on Tradjenta 5mg , Lantus 10U qhs and  amaryl 2mg  daily     5. Neuropsych: This patient is capable of making decisions on his own behalf. 6. Skin/Wound Care: Routine skin checks 7. Fluids/Electrolytes/Nutrition:strict I&O.Follow up labs 8.Hypertension. Lisinopril discontinued 09/16/2014 secondary to mild elevation in creatinine to 1.85..(Patient had also been on Coreg 12.5 mg twice a day prior to admission and lisinopril 40 mg daily) Monitor with increased mobility and resume as needed 9. History throat cancer. Status post dissection with radiation chemotherapy 2011 10.Hypothyroidism.Synthroid 11.Diastolic CHF. Monitor for any signs of fluid overload 12.Hyperlipidemia. Crestor 13.GERD.Protonix  LOS (Days) 24 A FACE TO FACE EVALUATION WAS PERFORMED  KIRSTEINS,ANDREW E 10/10/2014, 8:23 AM

## 2014-10-10 NOTE — Progress Notes (Signed)
Physical Therapy Session Note  Patient Details  Name: Brian Hall MRN: [id number] Date of Birth: [birth year]  Today's Date: 10/10/2014 PT Individual Time: 0930-1030 PT Individual Time Calculation (min): 60 min   Skilled Therapeutic Interventions/Progress Updates:   Co-treat with recreational therapy with focus on functional w/c mobility on unit using hemitechnique with cues for encouragement and efficiency of propulsion technique and neuro re-ed to address balance, postural control, midline, weightshifting, and gait with hemiwalker. Pt performed squat pivot transfers with light mod A and cues for set-up of w/c. Pt requiring min to max facilitation to increase WB on L in standing during functional balance activities in standing. Performed various activities in standing including ball taps, kicks with RLE, and reaching to L to activate at trunk and focus on upright posture and increasing WB to LLE. Seated rest breaks as needed. Gait with hemiwalker with max A and +2 for chair follow and for safety x 60' x 2 with focus on weightshifting, maintaining erect trunk, widening BOS, and coordination of L step. Pt emotional at times during session and support provided by therapists.   Therapy Documentation Precautions:  Precautions Precautions: Fall Precaution Comments: left inattention, labile Restrictions Weight Bearing Restrictions: No  Pain: No complaints of pain.  Locomotion : Ambulation Ambulation/Gait Assistance: 2: Max assist;1: +2 Total assist (+2 for w/c follow)   See FIM for current functional status  Therapy/Group: Individual Therapy and Co-Treatment with Recreational Therapy  Brian Hall [facility] 10/10/2014, 11:50 AM

## 2014-10-10 NOTE — Progress Notes (Signed)
Recreational Therapy Session Note  Patient Details  Name: Brian Hall MRN: [id number] Date of Birth: Mar 10, 1956 Today's Date: 10/10/2014  Pain: no c/o Skilled Therapeutic Interventions/Progress Updates: Session focused on activity tolerance, dynamic standing balance, W/c mobility & ambulation.  Pt propelled w/c from room to therapy gym with supervision & min instructional cues.  Once in gym pt performed sit-stands with min-mod assist with tactile & verbal cues for midline stance.  Pt performed ball tap activity standing with mod assist/mod cues for balance progressing to standing with 1 UE support on HW while kicking a ball with RLE to force WBing through RLE with mod assist.  Pt ambulated with HW ~60 with max assist.  Therapy/Group: Co-Treatment Armie Moren 10/10/2014, 3:07 PM

## 2014-10-11 ENCOUNTER — Inpatient Hospital Stay (HOSPITAL_COMMUNITY): Payer: 59 | Admitting: Speech Pathology

## 2014-10-11 ENCOUNTER — Inpatient Hospital Stay (HOSPITAL_COMMUNITY): Payer: 59 | Admitting: Rehabilitation

## 2014-10-11 ENCOUNTER — Encounter (HOSPITAL_COMMUNITY): Payer: 59

## 2014-10-11 LAB — GLUCOSE, CAPILLARY
GLUCOSE-CAPILLARY: 59 mg/dL — AB (ref 70–99)
GLUCOSE-CAPILLARY: 142 mg/dL — AB (ref 70–99)
GLUCOSE-CAPILLARY: 129 mg/dL — AB (ref 70–99)
Glucose-Capillary: 95 mg/dL (ref 70–99)
Glucose-Capillary: 65 mg/dL — ABNORMAL LOW (ref 70–99)
Glucose-Capillary: 95 mg/dL (ref 70–99)
Glucose-Capillary: 91 mg/dL (ref 70–99)

## 2014-10-11 NOTE — Progress Notes (Signed)
Physical Therapy Session Note  Patient Details  Name: Brian Hall MRN: [id number] Date of Birth: [birth year]  Today's Date: 10/11/2014 PT Individual Time: 1500-1530 PT Individual Time Calculation (min): 30 min   Short Term Goals: Week 4:  PT Short Term Goal 1 (Week 4): =LTG's due to ELOS  Skilled Therapeutic Interventions/Progress Updates:   Pt received using restroom with use of stedy, agreeable to therapy and ready to return to w/c.  Assisted nursing with standing while she assisted with peri care.  Cues for increased L weight shift for increased LLE WB and upright posture.  Assisted pt to sink using stedy to wash/dry hands, then into w/c.  Remainder of session focused on gait training with single therapist x 70' x 2 reps with hemi walker (L AFO and shoe cover) to chair target.  Placed two towel rolls around neck in order to encourage upright posture and decreased forward head>trunk lean.  Noted improvement with first 56', however continued to note increased forward trunk lean during second rep, esp with fatigue.  Provided facilitation at hips for forward weight shift over LLE during L stance, L foot placement and anterior pelvic tilt for hip ext.  Pt assisted back to room and left in w/c. Discussed having wife continue family education tomorrow and prepare for grad day on Thursday.  All needs in reach.   Therapy Documentation Precautions:  Precautions Precautions: Fall Precaution Comments: left inattention, labile Restrictions Weight Bearing Restrictions: No   Vital Signs: Therapy Vitals Temp: 98.8 F (37.1 C) Temp Source: Oral Pulse Rate: 89 Resp: 18 BP: 112/66 mmHg Patient Position (if appropriate): Sitting Oxygen Therapy SpO2: 99 % Pain: Pt with no pain during session.    Locomotion : Ambulation Ambulation/Gait Assistance: 3: Mod assist;2: Max assist   See FIM for current functional status  Therapy/Group: Individual Therapy  Denice Bors 10/11/2014, 4:32 PM

## 2014-10-11 NOTE — Progress Notes (Signed)
Recreational Therapy Session Note  Patient Details  Name: REINHOLD RICKEY MRN: [id number] Date of Birth: [birth year] Today's Date: 10/11/2014  Pain: no c/o Skilled Therapeutic Interventions/Progress Updates: Session focused on activity tolerance, w/c mobility, dynamic standing balance, midline stance, weight shifting, LUE use & ambulation.  Pt propelled w/c with supervision from room to gym.  Pt stood for simulated fishing activity using LUE for gross assist to hold fishing rod, hand over hand assist to for coordination & reaching for fish with LUE while holding fishing rod, mod assist/min-mod cues for balance.  Progressed to stepping forward with LLE and shifting weight onto LLE while completing fishing activity with mod-max assist.  Finished up session with ambulation using HW ~70' x2 with +2 assist, one person providing physical assist assist to pt & second person to provide tactile & verbal cues to hold head & chest upright.   Therapy/Group: Co-Treatment   Collier Bohnet 10/11/2014, 12:19 PM

## 2014-10-11 NOTE — Plan of Care (Signed)
Problem: RH Awareness Goal: LTG: Patient will demonstrate intellectual/emergent (SLP) LTG: Patient will demonstrate intellectual/emergent/anticipatory awareness with assist during a cognitive/linguistic activity (SLP)  Goals upgraded

## 2014-10-11 NOTE — Progress Notes (Signed)
Physical Therapy Session Note  Patient Details  Name: Brian Hall MRN: [id number] Date of Birth: 01/14/1956  Today's Date: 10/11/2014 PT Individual Time: 0930-1030 PT Individual Time Calculation (min): 60 min   Short Term Goals: Week 4:  PT Short Term Goal 1 (Week 4): =LTG's due to ELOS  Skilled Therapeutic Interventions/Progress Updates:   Pt received lying in bed, agreeable to therapy session.  Performed bed mobility at S level.  Once at EOB, assisted with donning socks and lace up shoes with L AFO for better fit on foot, esp during gait.  Transferred to w/c at S level with min cues for safety and LLE placement.  Pt self propelled to therapy gym x 150' using R hemi technique at S, min cues for scanning L.  RT joined in during session to address standing balance, weight shifting, orientation to midline, WB through LLE, functional use of LUE with fishing game.  Progressed to adding forward step with LLE to work on weight shift to the R and placement of LLE.  Tolerated well with mod to max A for standing with RT assisting with guiding LUE.  Ended session with gait training with hemi walker x 70' x 2 reps (L AFO and shoe cover for DF assist and L foot clearance).  Requires very little assist from PT today, but did assist with weight shift to the R and also for correct placement of LLE.  RT assisted with maintaining upright posture by giving tactile assist at chin for upright posture.  Noted marked improvement with this!  Assisted pt back to room and left in w/c with all needs in reach.   Therapy Documentation Precautions:  Precautions Precautions: Fall Precaution Comments: left inattention, labile Restrictions Weight Bearing Restrictions: No  Pain: pt with no c/o pain during session.    Locomotion : Ambulation Ambulation/Gait Assistance: 2: Max assist;1: +2 Total assist Wheelchair Mobility Distance: 150   See FIM for current functional status  Therapy/Group: Individual Therapy and  co-treat with RT  Denice Bors 10/11/2014, 12:24 PM

## 2014-10-11 NOTE — Progress Notes (Signed)
Speech Language Pathology Weekly Progress and Session Note  Patient Details  Name: Brian Hall MRN: [id number] Date of Birth: 1956-07-08  Beginning of progress report period: October 05, 2014 End of progress report period: October 11, 2014  Today's Date: 10/11/2014 SLP Individual Time: 4462-8638 SLP Individual Time Calculation (min): 30 min  Short Term Goals: Week 4: SLP Short Term Goal 1 (Week 4): Patient will attend to left of environment and left upper extremity during functional tasks with Supervision verbal/question cues.    SLP Short Term Goal 1 - Progress (Week 4): Progressing toward goal SLP Short Term Goal 2 (Week 4): Patient will demonsrate moderately complex problem solving during functional tasks with Min assist. SLP Short Term Goal 2 - Progress (Week 4): Progressing toward goal SLP Short Term Goal 3 (Week 4): Patient will alternate attention to functional tasks for 10-15 minutes with Min verbal cues for redirection.  SLP Short Term Goal 3 - Progress (Week 4): Progressing toward goal    New Short Term Goals: Week 5: SLP Short Term Goal 1 (Week 5): short term goals = long term goals   Weekly Progress Updates: Patient has made functional gains and is progressing toward meeting his current short trem goals, as a result they are being continued through this week or the end of his stay.  Additionally, due to progress longt rem goals were upgraded to address alternating attention, anticipatoryawareness and complex problem solving.  Currently, patient continues to require Min-Mod assist for moderately complex problem solving and most of patient and family education has been completed.  Patient continues to benfit from SLP intervention to complete education and maximize his functional independence prior to discharge home with 24/7 assist.      Intensity: Minumum of 1-2 x/day, 30 to 90 minutes Frequency: 3 to 5 out of 7 days Duration/Length of Stay:  10/14/14 Treatment/Interventions: Cognitive remediation/compensation;Cueing hierarchy;Patient/family education;Functional tasks;Internal/external aids;Environmental controls   Daily Session  Skilled Therapeutic Interventions: Skilled treatment session focused on addressing cognition goals. SLP facilitated session with Min question cues to recall new learning task from yesterday. Patient was able to alternate attention to task rules for ~25 minutes with Min assist.  Patient also required Mod verbal cues to self-monitor and correct errors. Continue with current plan of care.     FIM:  Comprehension Comprehension Mode: Auditory Comprehension: 5-Follows basic conversation/direction: With no assist Expression Expression Mode: Verbal Expression: 6-Expresses complex ideas: With extra time/assistive device Social Interaction Social Interaction: 6-Interacts appropriately with others with medication or extra time (anti-anxiety, antidepressant). Problem Solving Problem Solving: 5-Solves basic problems: With no assist Memory Memory: 4-Recognizes or recalls 75 - 89% of the time/requires cueing 10 - 24% of the time General    Pain Pain Assessment Pain Assessment: No/denies pain  Therapy/Group: Individual Therapy  Carmelia Roller., [place] [phone]  [place] 10/11/2014, 7:10 PM

## 2014-10-11 NOTE — Progress Notes (Signed)
Occupational Therapy Session Note  Patient Details  Name: MAHAD NEWSTROM MRN: [id number] Date of Birth: February 14, 1956  Today's Date: 10/11/2014 OT Individual Time: 1300-1400 OT Individual Time Calculation (min): 60 min    Short Term Goals: Week 4:  OT Short Term Goal 1 (Week 4): STG= LTGs secondary to upcoming discharge date  Skilled Therapeutic Interventions/Progress Updates:    Pt resting in w/c and agreeable to BADL retraining.  Pt engaged in BADL retraining including bathing at shower level and dressing with sit<>stand from w/c at sink.  Pt required min verbal cues throughout session for safety awareness.  Pt becomes frustrated during dressing tasks when dressing LUE and LLE.  Pt requires mod A and min verbal cues for weight shifts when standing with RUE support.  Pt required assistance with orienting shirt properly before donning.  Pt independently incorporated hemi bathing and dressing techniques learned in previous therapy sessions.  Focus on activity tolerance, sit<>stand, standing balance, hemi techniques, functional transfers, and safety awareness.  Therapy Documentation Precautions:  Precautions Precautions: Fall Precaution Comments: left inattention, labile Restrictions Weight Bearing Restrictions: No Pain:  Pt denied pain ADL: ADL ADL Comments: see FIM  See FIM for current functional status  Therapy/Group: Individual Therapy  Leroy Libman 10/11/2014, 2:13 PM

## 2014-10-11 NOTE — Progress Notes (Signed)
Subjective/Complaints: Feels ok, no issues overnite Review of systems is significant for left-sided weakness.  Objective: Vital Signs: Blood pressure 111/64, pulse 51, temperature 97.6 F (36.4 C), temperature source Oral, resp. rate 17, weight 108.6 kg (239 lb 6.7 oz), SpO2 98 %. No results found. Results for orders placed or performed during the hospital encounter of 09/16/14 (from the past 72 hour(s))  Glucose, capillary     Status: Abnormal   Collection Time: 10/08/14 11:35 AM  Result Value Ref Range   Glucose-Capillary 109 (H) 70 - 99 mg/dL  Glucose, capillary     Status: Abnormal   Collection Time: 10/08/14  4:37 PM  Result Value Ref Range   Glucose-Capillary 103 (H) 70 - 99 mg/dL  Glucose, capillary     Status: None   Collection Time: 10/08/14  9:13 PM  Result Value Ref Range   Glucose-Capillary 80 70 - 99 mg/dL  Glucose, capillary     Status: Abnormal   Collection Time: 10/09/14  7:13 AM  Result Value Ref Range   Glucose-Capillary 103 (H) 70 - 99 mg/dL  Glucose, capillary     Status: Abnormal   Collection Time: 10/09/14 11:40 AM  Result Value Ref Range   Glucose-Capillary 132 (H) 70 - 99 mg/dL  Glucose, capillary     Status: Abnormal   Collection Time: 10/09/14  4:28 PM  Result Value Ref Range   Glucose-Capillary 106 (H) 70 - 99 mg/dL  Glucose, capillary     Status: None   Collection Time: 10/09/14  9:11 PM  Result Value Ref Range   Glucose-Capillary 92 70 - 99 mg/dL   Comment 1 Notify RN   Glucose, capillary     Status: None   Collection Time: 10/10/14  7:33 AM  Result Value Ref Range   Glucose-Capillary 99 70 - 99 mg/dL      Extremity:  No Edema  Neuro: Alert/Oriented, Cranial Nerve Abnormalities left central 7, Abnormal Sensory decreased sensation to light touch left upper and left lower extremity, Abnormal Motor 2/5 Left shoulder Add, 2 minus left biceps,0/5 finger ext/flex  3 minus/5 in hip knee extensor synergy left lower extremity 0/5 at the ankle   Musc/Skel:  Other no pain with left shoulder or left lower extremity range of motion Gen.no acute distress Mood and affect appropriate Lungs clear Heart regular rate and rhythm no murmurs Abdomen positive bowel sounds soft nontender palpation  Assessment/Plan: 1. Functional deficits secondary to right MCA infarct which require 3+ hours per day of interdisciplinary therapy in a comprehensive inpatient rehab setting. Physiatrist is providing close team supervision and 24 hour management of active medical problems listed below. Physiatrist and rehab team continue to assess barriers to discharge/monitor patient progress toward functional and medical goals. Team conf in am FIM: FIM - Bathing Bathing Steps Patient Completed: Chest, Right Arm, Left Arm, Abdomen Bathing: 5: Set-up assist to: Obtain items  FIM - Upper Body Dressing/Undressing Upper body dressing/undressing steps patient completed: Thread/unthread right sleeve of pullover shirt/dresss, Thread/unthread left sleeve of pullover shirt/dress, Put head through opening of pull over shirt/dress, Pull shirt over trunk Upper body dressing/undressing: 5: Supervision: Safety issues/verbal cues FIM - Lower Body Dressing/Undressing Lower body dressing/undressing steps patient completed: Thread/unthread right underwear leg, Thread/unthread left underwear leg, Thread/unthread right pants leg, Thread/unthread left pants leg, Don/Doff right shoe Lower body dressing/undressing: 3: Mod-Patient completed 50-74% of tasks  FIM - Toileting Toileting steps completed by patient: Adjust clothing prior to toileting, Performs perineal hygiene Toileting Assistive Devices: Grab  bar or rail for support Toileting: 3: Mod-Patient completed 2 of 3 steps  FIM - Radio producer Devices: Elevated toilet seat Toilet Transfers: 1-Mechanical lift  FIM - Control and instrumentation engineer Devices: Arm rests,  Orthosis Bed/Chair Transfer: 3: Bed > Chair or W/C: Mod A (lift or lower assist), 3: Chair or W/C > Bed: Mod A (lift or lower assist)  FIM - Locomotion: Wheelchair Distance: 150 Locomotion: Wheelchair: 5: Travels 150 ft or more: maneuvers on rugs and over door sills with supervision, cueing or coaxing FIM - Locomotion: Ambulation Locomotion: Ambulation Assistive Devices:  (None, hands in NDT carpal hold) Ambulation/Gait Assistance: 2: Max assist, 1: +2 Total assist (+2 for w/c follow) Locomotion: Ambulation: 1: Two helpers  Comprehension Comprehension Mode: Auditory Comprehension: 5-Understands complex 90% of the time/Cues < 10% of the time  Expression Expression Mode: Verbal Expression: 6-Expresses complex ideas: With extra time/assistive device  Social Interaction Social Interaction: 6-Interacts appropriately with others with medication or extra time (anti-anxiety, antidepressant).  Problem Solving Problem Solving Mode: Asleep Problem Solving: 5-Solves basic problems: With no assist  Memory Memory: 5-Recognizes or recalls 90% of the time/requires cueing < 10% of the time  Medical Problem List and Plan:  1. 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 DVT Prophylaxis/Anticoagulation: SQ heparin .Monitor platelet counts and any signs of DVT 3. Pain Management: tylenol as needed 4. Diabetes Mellitus controlled with peripheral neuropathy.HGB A1C 7.7.SSI. Controlled on Tradjenta 5mg , Lantus 10U qhs and  amaryl 2mg  daily     5. Neuropsych: This patient is capable of making decisions on his own behalf. 6. Skin/Wound Care: Routine skin checks 7. Fluids/Electrolytes/Nutrition:strict I&O.Follow up labs 8.Hypertension. Lisinopril discontinued 09/16/2014 secondary to mild elevation in creatinine to 1.85..(Patient had also been on Coreg 12.5 mg twice a day prior to admission and lisinopril 40 mg daily) Monitor with increased mobility and resume  as needed 9. History throat cancer. Status post dissection with radiation chemotherapy 2011 10.Hypothyroidism.Synthroid 11.Diastolic CHF. Monitor for any signs of fluid overload 12.Hyperlipidemia. Crestor 13.GERD.Protonix  LOS (Days) 25 A FACE TO FACE EVALUATION WAS PERFORMED  KIRSTEINS,ANDREW E 10/11/2014, 8:28 AM

## 2014-10-11 NOTE — Progress Notes (Signed)
Orthopedic Tech Progress Note Patient Details:  Brian Hall 1956-04-01 [id number] [facility] called for AFO order. Order taken by [facility]. Patient ID: Brian Hall, male   DOB: 1955/09/15, 59 y.o.   MRN: [id number]   [name] 10/11/2014, 2:30 PM

## 2014-10-12 ENCOUNTER — Inpatient Hospital Stay (HOSPITAL_COMMUNITY): Payer: 59 | Admitting: Occupational Therapy

## 2014-10-12 ENCOUNTER — Ambulatory Visit (HOSPITAL_COMMUNITY): Payer: 59 | Admitting: Rehabilitation

## 2014-10-12 ENCOUNTER — Inpatient Hospital Stay (HOSPITAL_COMMUNITY): Payer: 59 | Admitting: Speech Pathology

## 2014-10-12 LAB — GLUCOSE, CAPILLARY
GLUCOSE-CAPILLARY: 104 mg/dL — AB (ref 70–99)
Glucose-Capillary: 79 mg/dL (ref 70–99)
Glucose-Capillary: 141 mg/dL — ABNORMAL HIGH (ref 70–99)
Glucose-Capillary: 88 mg/dL (ref 70–99)
Glucose-Capillary: 86 mg/dL (ref 70–99)

## 2014-10-12 NOTE — Progress Notes (Signed)
Social Work Patient ID: Brian Hall, male   DOB: 1956/08/18, 59 y.o.   MRN: [id number] Met with pt and wife to discuss team conference progress and discharge Friday.  Both are pleased with how well he is doing.  Wife glad she purchased a steady really helps with transfers and moving pt. Will begin with home health therapies and progress toward OP therapies, once doing more and more mobile.  Wife feels prepared for home on Friday.

## 2014-10-12 NOTE — Progress Notes (Signed)
Subjective/Complaints:   No further episodes of lability. Patient feels optimistic for discharged on Friday. No concerns about discharge other than equipment and follow-up plans Review of systems is significant for left-sided weakness.  Objective: Vital Signs: Blood pressure 116/66, pulse 50, temperature 97.1 F (36.2 C), temperature source Oral, resp. rate 18, weight 108.6 kg (239 lb 6.7 oz), SpO2 100 %. No results found. Results for orders placed or performed during the hospital encounter of 09/16/14 (from the past 72 hour(s))  Glucose, capillary     Status: Abnormal   Collection Time: 10/09/14 11:40 AM  Result Value Ref Range   Glucose-Capillary 132 (H) 70 - 99 mg/dL  Glucose, capillary     Status: Abnormal   Collection Time: 10/09/14  4:28 PM  Result Value Ref Range   Glucose-Capillary 106 (H) 70 - 99 mg/dL  Glucose, capillary     Status: None   Collection Time: 10/09/14  9:11 PM  Result Value Ref Range   Glucose-Capillary 92 70 - 99 mg/dL   Comment 1 Notify RN   Glucose, capillary     Status: None   Collection Time: 10/10/14  7:33 AM  Result Value Ref Range   Glucose-Capillary 99 70 - 99 mg/dL  Glucose, capillary     Status: Abnormal   Collection Time: 10/10/14  4:52 PM  Result Value Ref Range   Glucose-Capillary 59 (L) 70 - 99 mg/dL  Glucose, capillary     Status: None   Collection Time: 10/10/14  5:59 PM  Result Value Ref Range   Glucose-Capillary 95 70 - 99 mg/dL  Glucose, capillary     Status: Abnormal   Collection Time: 10/10/14  8:50 PM  Result Value Ref Range   Glucose-Capillary 142 (H) 70 - 99 mg/dL  Glucose, capillary     Status: Abnormal   Collection Time: 10/11/14 11:56 AM  Result Value Ref Range   Glucose-Capillary 129 (H) 70 - 99 mg/dL   Comment 1 Notify RN   Glucose, capillary     Status: Abnormal   Collection Time: 10/11/14  4:33 PM  Result Value Ref Range   Glucose-Capillary 65 (L) 70 - 99 mg/dL   Comment 1 Notify RN   Glucose, capillary      Status: None   Collection Time: 10/11/14  4:56 PM  Result Value Ref Range   Glucose-Capillary 95 70 - 99 mg/dL   Comment 1 Notify RN   Glucose, capillary     Status: None   Collection Time: 10/11/14  9:44 PM  Result Value Ref Range   Glucose-Capillary 91 70 - 99 mg/dL   Comment 1 Notify RN   Glucose, capillary     Status: None   Collection Time: 10/12/14  7:01 AM  Result Value Ref Range   Glucose-Capillary 79 70 - 99 mg/dL   Comment 1 Notify RN       Extremity:  No Edema  Neuro: Alert/Oriented, Cranial Nerve Abnormalities left central 7, Abnormal Sensory decreased sensation to light touch left upper and left lower extremity, Abnormal Motor 2/5 Left shoulder Add, 2 minus left biceps,1/5 finger ext/flex  3 minus/5 in hip knee extensor synergy left lower extremity 0/5 at the ankle  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 Functional  deficits secondary to right MCA infarct which require 3+ hours per day of interdisciplinary therapy in a comprehensive inpatient rehab setting. Physiatrist is providing close team supervision and 24 hour management of active medical problems listed below. Physiatrist and rehab team continue to assess barriers to discharge/monitor patient progress toward functional and medical goals. Team conference today please see physician documentation under team conference tab, met with team face-to-face to discuss problems,progress, and goals. Formulized individual treatment plan based on medical history, underlying problem and comorbidities. FIM: FIM - Bathing Bathing Steps Patient Completed: Chest, Right Arm, Left Arm, Abdomen, Front perineal area, Right upper leg, Left upper leg, Right lower leg  (including foot), Left lower leg (including foot) Bathing: 4: Min-Patient completes 8-9 [id number] parts or 75+ percent  FIM - Upper Body Dressing/Undressing Upper body dressing/undressing steps patient completed: Thread/unthread right sleeve of pullover shirt/dresss, Thread/unthread left sleeve of pullover shirt/dress, Put head through opening of pull over shirt/dress, Pull shirt over trunk Upper body dressing/undressing: 5: Supervision: Safety issues/verbal cues FIM - Lower Body Dressing/Undressing Lower body dressing/undressing steps patient completed: Thread/unthread right underwear leg, Thread/unthread left underwear leg, Thread/unthread right pants leg, Thread/unthread left pants leg, Don/Doff right shoe Lower body dressing/undressing: 3: Mod-Patient completed 50-74% of tasks  FIM - Toileting Toileting steps completed by patient: Adjust clothing prior to toileting, Performs perineal hygiene Toileting Assistive Devices: Grab bar or rail for support Toileting: 3: Mod-Patient completed 2 of 3 steps  FIM - TRadio producerDevices: Elevated toilet seat (stedy) Toilet Transfers: 1-Mechanical lift (assisted nursing)  FIM - BControl and instrumentation engineerDevices: Arm rests, Orthosis Bed/Chair Transfer: 5: Supine > Sit: Supervision (verbal cues/safety issues), 5: Bed > Chair or W/C: Supervision (verbal cues/safety issues)  FIM - Locomotion: Wheelchair Distance: 150 Locomotion: Wheelchair: 5: Travels 150 ft or more: maneuvers on rugs and over door sills with supervision, cueing or coaxing FIM - Locomotion: Ambulation Locomotion: Ambulation Assistive Devices: WMuseum/gallery curatorAmbulation/Gait Assistance: 3: Mod assist, 2: Max assist Locomotion: Ambulation: 2: Travels 50 - 149 ft with maximal assistance (Pt: 25 - 49%)  Comprehension Comprehension Mode: Auditory Comprehension: 5-Follows basic conversation/direction: With no  assist  Expression Expression Mode: Verbal Expression: 6-Expresses complex ideas: With extra time/assistive device  Social Interaction Social Interaction: 6-Interacts appropriately with others with medication or extra time (anti-anxiety, antidepressant).  Problem Solving Problem Solving Mode: Asleep Problem Solving: 5-Solves basic problems: With no assist  Memory Memory: 4-Recognizes or recalls 75 - 89% of the time/requires cueing 10 - 24% of the time  Medical Problem List and Plan:  1. 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 DVT Prophylaxis/Anticoagulation: SQ heparin .Monitor platelet counts and any signs of DVT 3. Pain Management: tylenol as needed 4. Diabetes Mellitus controlled with peripheral neuropathy.HGB A1C 7.7.SSI. Controlled on Tradjenta 53m Lantus 10U qhs and  amaryl 69m45maily     5. Neuropsych: This patient is capable of making decisions on his own behalf. 6. Skin/Wound Care: Routine skin checks 7. Fluids/Electrolytes/Nutrition:strict I&O.Follow up labs 8.Hypertension. Lisinopril discontinued 09/16/2014 secondary to mild elevation in creatinine to 1.85..(Patient had also been on Coreg 12.5 mg twice a day prior to admission and lisinopril 40 mg daily) Monitor with increased mobility and resume as needed 9. History throat cancer. Status post dissection with radiation chemotherapy 2011 10.Hypothyroidism.Synthroid 11.Diastolic CHF. Monitor for any signs of fluid overload 12.Hyperlipidemia. Crestor 13.GERD.Protonix  LOS (Days) 26 A FACE TO FACE EVALUATION WAS PERFORMED  KIRCharlett Blake  10/12/2014, 8:58 AM

## 2014-10-12 NOTE — Progress Notes (Signed)
Physical Therapy Session Note  Patient Details  Name: Brian Hall MRN: [id number] Date of Birth: [birth year]  Today's Date: 10/12/2014 PT Individual Time: 1300-1400 PT Individual Time Calculation (min): 60 min   Short Term Goals: Week 4:  PT Short Term Goal 1 (Week 4): =LTG's due to ELOS  Skilled Therapeutic Interventions/Progress Updates:   Pt received sitting in w/c in room, agreeable to therapy.  Discussed goals of session was to continue family training with wife, esp for car transfers with and without use of stedy.  Pt self propelled part of way to gym x 150' using R hemi technique at S level.  Single cue for scanning L environment.  Assisted remainder of distance to ortho gym.  Performed car transfer x 3 reps w/c<>car via squat pivot at min/guard level.  Provided cues for correct set up of w/c, removal of parts and how to place herself for best assisting pt into/out of car.  Note that he tends to have most difficulty on first trial going into the car to the L.  Provided education that if he is not easily going to the L, to have him sit back down and re-set to trial again.  Provided education to why stedy may not be safest choice due to car door not opening as wide.  Was unable to fully practice due to stedy not able to fit directly under practice car.  Discussed practicing again tomorrow to her actual car.  Pt and wife verbalized understanding. Chris from [facility] present briefly to provide pt with brace and take his shoe for shoe cap modification.  Ended session with gait x 65' x 1 with hemi walker at max A level (without shoe cover today).  Continues to need max verbal cues for upright and trunk extension, as well as facilitation for proper placement of LLE and forward weight shift over LLE during L stance.  Pt very fatigued and as gait continued, noted increase in forward trunk lean.  Pt assisted back to chair and assisted back to room.  All needs in reach.   Therapy  Documentation Precautions:  Precautions Precautions: Fall Precaution Comments: left inattention, labile Restrictions Weight Bearing Restrictions: No  Pain: Pain Assessment Pain Assessment: No/denies pain Locomotion : Ambulation Ambulation/Gait Assistance: 2: Max assist   See FIM for current functional status  Therapy/Group: Individual Therapy and co-treat with RT   Denice Bors 10/12/2014, 2:13 PM

## 2014-10-12 NOTE — Progress Notes (Signed)
Speech Language Pathology Daily Session Note  Patient Details  Name: Brian Hall MRN: [id number] Date of Birth: 06-16-1956  Today's Date: 10/12/2014 SLP Individual Time: 0830-0930 SLP Individual Time Calculation (min): 60 min  Short Term Goals: Week 5: SLP Short Term Goal 1 (Week 5): short term goals = long term goals   Skilled Therapeutic Interventions: Skilled treatment session focused on addressing cognition goals.  SLP facilitated session by providing Mod faded to Min assist verbal and question cues for recall of procedures of a moderately complex task.  Patient was able to complete task for ~30 minutes with increased wait time to self-monitor and correct errors.  Continue with current plan of care.   FIM:  Comprehension Comprehension Mode: Auditory Comprehension: 5-Follows basic conversation/direction: With no assist Expression Expression Mode: Verbal Expression: 6-Expresses complex ideas: With extra time/assistive device Social Interaction Social Interaction: 6-Interacts appropriately with others with medication or extra time (anti-anxiety, antidepressant). Problem Solving Problem Solving: 5-Solves basic problems: With no assist Memory Memory: 4-Recognizes or recalls 75 - 89% of the time/requires cueing 10 - 24% of the time  Pain Pain Assessment Pain Assessment: No/denies pain  Therapy/Group: Individual Therapy  Carmelia Roller., CCC-SLP [phone]  [place] 10/12/2014, 11:58 AM

## 2014-10-12 NOTE — Progress Notes (Signed)
Occupational Therapy Session Note  Patient Details  Name: Brian Hall MRN: [id number] Date of Birth: 02/11/1956  Today's Date: 10/12/2014 OT Individual Time: 1000-1100 OT Individual Time Calculation (min): 60 min    Short Term Goals: Week 4:  OT Short Term Goal 1 (Week 4): STG= LTGs secondary to upcoming discharge date  Skilled Therapeutic Interventions/Progress Updates:  Upon entering the room, pt seated in wheelchair with caregiver, his wife, present for ADL training session. Skilled OT session with focus on family education, STS, functional transfers, safety,and self care retraining. Education has previously begun with pt prior to this session. Wife has purchased STEDY to assist her at home with transfers secondary to her comfort level with these transfers at home. Pt performs Mod A squat pivot transfer onto TTB with therapist. Wife demonstrates use of STEDY for transfer on and off of TTB for bathing. Pt performed bathing from shower level while seated on TTB with wife providing supervision and assistance as needed such as cleaning buttocks. OT continues to educate communication importance between pt and caregiver during transfers in order to increase safety. Wife assisted with clothing management while pt stood in [facility] as she will be doing at home in bathroom at sink side. She did a great job and reports feeling very comfortable with these tasks at this time. Pt seated in wheelchair with L arm tray donned and all needs within reach upon therapist exiting the room.   Therapy Documentation Precautions:  Precautions Precautions: Fall Precaution Comments: left inattention, labile Restrictions Weight Bearing Restrictions: No ADL: ADL ADL Comments: see FIM E See FIM for current functional status  Therapy/Group: Individual Therapy  [name] 10/12/2014, 11:44 AM

## 2014-10-12 NOTE — Patient Care Conference (Signed)
Inpatient RehabilitationTeam Conference and Plan of Care Update Date: 10/12/2014   Time: 11;25 AM    Patient Name: Brian Hall      Medical Record Number: [id number]  Date of Birth: 07-12-1956 Sex: Male         Room/Bed: 4W06C/4W06C-01 Payor Info: Payor: [employer] / Plan: Electrical engineer / Product Type: *No Product type* /    Admitting Diagnosis: cva  Admit Date/Time:  09/16/2014  3:20 PM Admission Comments: No comment available   Primary Diagnosis:  Acute ischemic right MCA stroke Principal Problem: Acute ischemic right MCA stroke  Patient Active Problem List   Diagnosis Date Noted  . Left-sided neglect 09/23/2014  . Acute ischemic right MCA stroke 09/16/2014  . Left hemiparesis   . CVA (cerebral vascular accident)   . Right carotid artery occlusion   . Acute right MCA stroke   . Essential hypertension 09/12/2014  . CVA (cerebral infarction) 09/12/2014  . Type 2 diabetes mellitus with peripheral neuropathy 09/12/2014    Expected Discharge Date: Expected Discharge Date: 10/14/14  Team Members Present: Physician leading conference: Dr. Alysia Penna Social Worker Present: Ovidio Kin, LCSW Nurse Present: Elliot Cousin, RN PT Present: Raylene Everts, PT;Ulus Hazen Varner, PT;Emily Rinaldo Cloud, PT OT Present: Clyda Greener, Rhetta Mura, OT SLP Present: Gunnar Fusi, SLP PPS Coordinator present : Daiva Nakayama, RN, CRRN     Current Status/Progress Goal Weekly Team Focus  Medical   Emotional lability mild and occasional, left upper extremity and left lower extremity  neuromuscular recovery proceeding well  Home discharge  Discharge planning   Bowel/Bladder   cont of B+B; using urinal  cont of B+B without using equip  encourage up to toilet for elimination   Swallow/Nutrition/ Hydration             ADL's   grooming & UB dressing-supervision; bathing-min A; LB dressing-mod A; functional transfers-mod A  supervision to min assist level  BADL retraining, sit<>stand, standing  balance, family education, LUE NMR   Mobility   S for bed mobility, S for squat pivot transfers, min to mod A standing, mod/max for dynamic standing, max A gait with use of hemi walker.   S to min overall  pt/family training, D/C planning, NMR, gait   Communication             Safety/Cognition/ Behavioral Observations  Supervision with basic and Min-Mod assist with complex  Min assist   continue with attention to left upper extremity as well as lef-monitoring and correcting of errors   Pain   pain managed at level 4 or less with ultram q 6 hrs  pain less than or equal to 3  pain med q 6 and before bed, assess tolerance to activity and need for repositioning   Skin   n/a            *See Care Plan and progress notes for long and short-term goals.  Barriers to Discharge: None, See below    Possible Resolutions to Barriers:  Discharge in a.m. after completing family training    Discharge Planning/Teaching Needs:  Finishing up family education and working toward discharge on Friday.  Wife here daily and assisting with pt's care      Team Discussion:  Trunk issues-limits his ambulation-only ambulating with therapists. Wife purchased a steady for home. AFO today. Finishing family education for discharge Friday  Revisions to Treatment Plan:  None   Continued Need for Acute Rehabilitation Level of Care: The patient requires daily medical management by  a physician with specialized training in physical medicine and rehabilitation for the following conditions: Daily direction of a multidisciplinary physical rehabilitation program to ensure safe treatment while eliciting the highest outcome that is of practical value to the patient.: Yes Daily medical management of patient stability for increased activity during participation in an intensive rehabilitation regime.: Yes Daily analysis of laboratory values and/or radiology reports with any subsequent need for medication adjustment of medical  intervention for : Neurological problems;Other  Argie Applegate, [name] 10/12/2014, 2:50 PM

## 2014-10-13 ENCOUNTER — Inpatient Hospital Stay (HOSPITAL_COMMUNITY): Payer: 59 | Admitting: Speech Pathology

## 2014-10-13 ENCOUNTER — Inpatient Hospital Stay (HOSPITAL_COMMUNITY): Payer: 59 | Admitting: Occupational Therapy

## 2014-10-13 ENCOUNTER — Inpatient Hospital Stay (HOSPITAL_COMMUNITY): Payer: 59 | Admitting: Rehabilitation

## 2014-10-13 LAB — GLUCOSE, CAPILLARY
GLUCOSE-CAPILLARY: 89 mg/dL (ref 70–99)
Glucose-Capillary: 112 mg/dL — ABNORMAL HIGH (ref 70–99)
Glucose-Capillary: 149 mg/dL — ABNORMAL HIGH (ref 70–99)
Glucose-Capillary: 88 mg/dL (ref 70–99)

## 2014-10-13 NOTE — Plan of Care (Signed)
Problem: RH Other (Specify) Goal: RH LTG Other (Specify) Outcome: Not Applicable Date Met:  56/38/93 Pt requires min A to negotiate home ramp and simulated ramp in therapy gym.

## 2014-10-13 NOTE — Progress Notes (Signed)
Recreational Therapy Discharge Summary Patient Details  Name: Brian Hall MRN: [id number] Date of Birth: 1956-08-31 Today's Date: 10/13/2014  Long term goals set: 1  Long term goals met: 1  Comments on progress toward goals: Pt has made good progress toward goal and is ready for discharge home with wife to provide 24 hour supervision/assistance.  Pt requires supervision/set up assist- min assist for simple TR task completion seated.  Pt also continues to require up to min verbal cues for simple task completion.  Pt is anxious to discharge home and return to previously enjoyed leisure & community pursuits. Reasons for discharge: discharge from hospital  Patient/family agrees with progress made and goals achieved: Yes  Jenan Ellegood 10/13/2014, 1:42 PM

## 2014-10-13 NOTE — Progress Notes (Signed)
Physical Therapy Discharge Summary  Patient Details  Name: Brian Hall MRN: [id number] Date of Birth: 22-Apr-1956  Today's Date: 10/13/2014 PT Individual Time: 3474-2595 PT Individual Time Calculation (min): 75 min    Patient has met 7 of 9 long term goals due to improved activity tolerance, improved balance, improved postural control, increased strength, ability to compensate for deficits, functional use of  left upper extremity and left lower extremity, improved attention, improved awareness and improved coordination.  Patient to discharge at a wheelchair level Modified Independent.   Patient's care partner is independent to provide the necessary physical and cognitive assistance at discharge.  Reasons goals not met: Pt did not meet community w/c mobility goal of Mod I, as he requires S for safety and cuing in busy environment.  He was also unable to meet w/c ramp negotiation goal due to incline of his ramp.  Wife will provide assist.   Recommendation:  Patient will benefit from ongoing skilled PT services in home health setting to continue to advance safe functional mobility, address ongoing impairments in decreased balance, decreased functional use of LUE/LE, decreased attention/awareness, and minimize fall risk.  Equipment: w/c, cushion, AFO, hemi walker  Reasons for discharge: treatment goals met and discharge from hospital  Patient/family agrees with progress made and goals achieved: Yes   PT Treatment/Intervention:  Pt received coming from speech office, having just finished with SLP.  Pt propelled x 150' in controlled and home environment at mod I level throughout session.  Pt able to manage brakes and w/c parts on his own.  Performed all transfers to bed, furniture, bed at S level with min cues for safe placement of LLE and hand placement.  Performed gait x 60' with hemi walker and L AFO with max A, continuing to provide strong facilitation for upright posture and forward  translation of hips over LEs in stead of leading with trunk.  Also assisted with LLE placement.  Performed stairs with +2 A however did much better with stairs compared to last time performed.  Some assist for LLE placement, but good initiation of movement.  Ended session with assisting pt to wife's car to practice again to ensure safety at D/C.  Pt performed VERY well at min/guard level from wife.  Cues for set up and parts management.  Assisted pt back to day room in order to participate in Valentine's Day party with RT.     PT Discharge Precautions/Restrictions Precautions Precautions: Fall Precaution Comments: left inattention, labile, pusher to the L during gait Restrictions Weight Bearing Restrictions: No Vital Signs Therapy Vitals Temp: 98.4 F (36.9 C) Temp Source: Oral Pulse Rate: (!) 52 Resp: 20 BP: 128/66 mmHg Oxygen Therapy SpO2: 99 % O2 Device: Not Delivered Pain Pain Assessment Pain Assessment: No/denies pain Pain Score: 0-No pain Vision/Perception   See OT notes Cognition Overall Cognitive Status: Impaired/Different from baseline Arousal/Alertness: Awake/alert Orientation Level: Oriented X4 Attention: Selective;Alternating Focused Attention: Appears intact Sustained Attention: Appears intact Selective Attention: Appears intact Alternating Attention: Impaired Alternating Attention Impairment: Functional complex Memory: Impaired Memory Impairment: Decreased short term memory Decreased Short Term Memory: Functional basic Awareness: Impaired Awareness Impairment: Anticipatory impairment;Emergent impairment Safety/Judgment: Appears intact Comments: Pt with increased safety awareness and is able to state that he needs assist when transferring.  Sensation Coordination Gross Motor Movements are Fluid and Coordinated: No Fine Motor Movements are Fluid and Coordinated: No Coordination and Movement Description: Pt with decreased coordination in LUE/LE due to  decreased functional use/strength.  Motor  Motor  Motor: Hemiplegia;Abnormal postural alignment and control Motor - Discharge Observations: Pt continues to have decreased functional use of LUE/LE, decreased balance and decreased postural control.   Mobility Bed Mobility Bed Mobility: Supine to Sit;Sit to Supine;Rolling Right Rolling Right: 5: Supervision Rolling Right Details: Verbal cues for precautions/safety;Verbal cues for technique;Verbal cues for sequencing Right Sidelying to Sit: 5: Supervision Right Sidelying to Sit Details: Verbal cues for sequencing;Verbal cues for technique;Verbal cues for precautions/safety Supine to Sit: 5: Supervision Supine to Sit Details: Verbal cues for sequencing;Verbal cues for technique;Verbal cues for precautions/safety Sit to Supine: 5: Supervision Sit to Supine - Details: Verbal cues for sequencing;Verbal cues for technique;Verbal cues for precautions/safety Transfers Transfers: Yes Sit to Stand: 4: Min assist Sit to Stand Details: Verbal cues for technique;Verbal cues for precautions/safety;Verbal cues for sequencing;Manual facilitation for weight shifting;Manual facilitation for weight bearing Stand to Sit: 4: Min assist Stand to Sit Details (indicate cue type and reason): Verbal cues for sequencing;Verbal cues for technique;Verbal cues for precautions/safety;Manual facilitation for weight shifting;Manual facilitation for weight bearing Squat Pivot Transfers: 5: Supervision Squat Pivot Transfer Details: Verbal cues for sequencing;Verbal cues for technique;Verbal cues for precautions/safety Locomotion  Ambulation Ambulation: Yes Ambulation/Gait Assistance: 2: Max assist Ambulation Distance (Feet): 30 Feet Assistive device: Hemi-walker Ambulation/Gait Assistance Details: Verbal cues for sequencing;Verbal cues for technique;Verbal cues for precautions/safety;Manual facilitation for weight shifting;Verbal cues for safe use of DME/AE;Verbal cues for  gait pattern;Manual facilitation for placement;Manual facilitation for weight bearing Gait Gait: Yes Gait Pattern: Impaired Gait Pattern: Step-to pattern;Decreased stride length;Decreased dorsiflexion - left;Decreased weight shift to right;Decreased hip/knee flexion - left;Trunk rotated posteriorly on right;Trunk flexed;Narrow base of support;Poor foot clearance - left Stairs / Additional Locomotion Stairs: Yes Stairs Assistance: 1: +2 Total assist Stairs Assistance Details: Verbal cues for sequencing;Verbal cues for technique;Verbal cues for precautions/safety;Manual facilitation for weight shifting;Verbal cues for gait pattern;Verbal cues for safe use of DME/AE;Manual facilitation for placement;Manual facilitation for weight bearing Stair Management Technique: One rail Right;Step to pattern;Forwards Number of Stairs: 5 Height of Stairs: 4 Ramp: 4: Min Chemical engineer: Yes Wheelchair Assistance: 6: Modified independent (Device/Increase time) Environmental health practitioner: Right upper extremity;Right lower extremity Wheelchair Parts Management: Supervision/cueing Distance: 200  Trunk/Postural Assessment  Cervical Assessment Cervical Assessment: Within Functional Limits Thoracic Assessment Thoracic Assessment: Within Functional Limits Lumbar Assessment Lumbar Assessment: Exceptions to [facility] Lumbar Strength Overall Lumbar Strength Comments: Pt with posterior pelvic tilt and decreased WB to the R in sitting and standing. Postural Control Postural Control: Deficits on evaluation Righting Reactions: Improved in sitting, however still delayed in standing due to perceptual deficits.  Protective Responses: improved in sitting, however still delayed in standing due to perceptual deficits.   Balance Balance Balance Assessed: Yes Static Sitting Balance Static Sitting - Balance Support: Feet supported Static Sitting - Level of Assistance: 6: Modified independent  (Device/Increase time) Dynamic Sitting Balance Dynamic Sitting - Balance Support: Feet supported;During functional activity Dynamic Sitting - Level of Assistance: 5: Stand by assistance Static Standing Balance Static Standing - Balance Support: Right upper extremity supported;During functional activity Static Standing - Level of Assistance: 4: Min assist;5: Stand by assistance Dynamic Standing Balance Dynamic Standing - Balance Support: During functional activity Dynamic Standing - Level of Assistance: 4: Min assist Extremity Assessment      RLE Assessment RLE Assessment: Within Functional Limits LLE Assessment LLE Assessment: Exceptions to WFL LLE PROM (degrees) Overall PROM Left Lower Extremity: Deficits LLE Strength LLE Overall Strength: Deficits LLE Overall Strength Comments: hip flex 3-/5, hip  ext 3/5, knee ext 2+/5, knee flex 2-/5, ankle and toes motions are trace  See FIM for current functional status  Denice Bors 10/13/2014, 8:46 PM

## 2014-10-13 NOTE — Progress Notes (Signed)
Subjective/Complaints:  No problems overnite  Review of systems is significant for left-sided weakness.  Objective: Vital Signs: Blood pressure 128/66, pulse 52, temperature 98.4 F (36.9 C), temperature source Oral, resp. rate 20, weight 108.6 kg (239 lb 6.7 oz), SpO2 99 %. No results found. Results for orders placed or performed during the hospital encounter of 09/16/14 (from the past 72 hour(s))  Glucose, capillary     Status: Abnormal   Collection Time: 10/10/14  4:52 PM  Result Value Ref Range   Glucose-Capillary 59 (L) 70 - 99 mg/dL  Glucose, capillary     Status: None   Collection Time: 10/10/14  5:59 PM  Result Value Ref Range   Glucose-Capillary 95 70 - 99 mg/dL  Glucose, capillary     Status: Abnormal   Collection Time: 10/10/14  8:50 PM  Result Value Ref Range   Glucose-Capillary 142 (H) 70 - 99 mg/dL  Glucose, capillary     Status: Abnormal   Collection Time: 10/11/14  6:49 AM  Result Value Ref Range   Glucose-Capillary 104 (H) 70 - 99 mg/dL  Glucose, capillary     Status: Abnormal   Collection Time: 10/11/14 11:56 AM  Result Value Ref Range   Glucose-Capillary 129 (H) 70 - 99 mg/dL   Comment 1 Notify RN   Glucose, capillary     Status: Abnormal   Collection Time: 10/11/14  4:33 PM  Result Value Ref Range   Glucose-Capillary 65 (L) 70 - 99 mg/dL   Comment 1 Notify RN   Glucose, capillary     Status: None   Collection Time: 10/11/14  4:56 PM  Result Value Ref Range   Glucose-Capillary 95 70 - 99 mg/dL   Comment 1 Notify RN   Glucose, capillary     Status: None   Collection Time: 10/11/14  9:44 PM  Result Value Ref Range   Glucose-Capillary 91 70 - 99 mg/dL   Comment 1 Notify RN   Glucose, capillary     Status: None   Collection Time: 10/12/14  7:01 AM  Result Value Ref Range   Glucose-Capillary 79 70 - 99 mg/dL   Comment 1 Notify RN   Glucose, capillary     Status: Abnormal   Collection Time: 10/12/14 11:26 AM  Result Value Ref Range   Glucose-Capillary 141 (H) 70 - 99 mg/dL  Glucose, capillary     Status: None   Collection Time: 10/12/14  4:20 PM  Result Value Ref Range   Glucose-Capillary 88 70 - 99 mg/dL  Glucose, capillary     Status: None   Collection Time: 10/12/14  8:40 PM  Result Value Ref Range   Glucose-Capillary 86 70 - 99 mg/dL   Comment 1 Notify RN   Glucose, capillary     Status: None   Collection Time: 10/13/14  6:55 AM  Result Value Ref Range   Glucose-Capillary 89 70 - 99 mg/dL   Comment 1 Notify RN       Extremity:  No Edema  Neuro: Alert/Oriented, Cranial Nerve Abnormalities left central 7, Abnormal Sensory decreased sensation to light touch left upper and left lower extremity, Abnormal Motor 2/5 Left shoulder Add, 2 minus left biceps,1/5 finger ext/flex  3 minus/5 in hip knee extensor synergy left lower extremity 0/5 at the ankle  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 Functional deficits secondary to right MCA infarct which require 3+ hours per day of interdisciplinary therapy in a comprehensive inpatient rehab setting. Physiatrist is providing close team supervision and 24 hour management of active medical problems listed below. Physiatrist and rehab team continue to assess barriers to discharge/monitor patient progress toward functional and medical goals. Plan  D/C in am FIM: FIM - Bathing Bathing Steps Patient Completed: Chest, Right Arm, Left Arm, Abdomen, Front perineal area, Right upper leg, Left upper leg, Right lower leg (including foot), Left lower leg (including foot) Bathing: 4: Min-Patient completes 8-9 56f 10 parts or 75+ percent  FIM - Upper Body Dressing/Undressing Upper body dressing/undressing steps  patient completed: Thread/unthread right sleeve of pullover shirt/dresss, Thread/unthread left sleeve of pullover shirt/dress, Put head through opening of pull over shirt/dress, Pull shirt over trunk Upper body dressing/undressing: 5: Supervision: Safety issues/verbal cues FIM - Lower Body Dressing/Undressing Lower body dressing/undressing steps patient completed: Thread/unthread right underwear leg, Thread/unthread left underwear leg, Thread/unthread right pants leg, Thread/unthread left pants leg, Don/Doff right shoe Lower body dressing/undressing: 3: Mod-Patient completed 50-74% of tasks  FIM - Toileting Toileting steps completed by patient: Adjust clothing prior to toileting, Performs perineal hygiene Toileting Assistive Devices: Grab bar or rail for support Toileting: 3: Mod-Patient completed 2 of 3 steps  FIM - Radio producer Devices: Elevated toilet seat (stedy) Toilet Transfers: 1-Mechanical lift (assisted nursing)  FIM - Control and instrumentation engineer Devices: Arm rests, Orthosis Bed/Chair Transfer: 5: Supine > Sit: Supervision (verbal cues/safety issues), 5: Bed > Chair or W/C: Supervision (verbal cues/safety issues)  FIM - Locomotion: Wheelchair Distance: 200 Locomotion: Wheelchair: 5: Travels 150 ft or more: maneuvers on rugs and over door sills with supervision, cueing or coaxing FIM - Locomotion: Ambulation Locomotion: Ambulation Assistive Devices: Museum/gallery curator Ambulation/Gait Assistance: 2: Max assist Locomotion: Ambulation: 2: Travels 50 - 149 ft with moderate assistance (Pt: 50 - 74%)  Comprehension Comprehension Mode: Auditory Comprehension: 5-Follows basic conversation/direction: With no assist  Expression Expression Mode: Verbal Expression: 6-Expresses complex ideas: With extra time/assistive device  Social Interaction Social Interaction: 6-Interacts appropriately with others with medication or extra time  (anti-anxiety, antidepressant).  Problem Solving Problem Solving Mode: Asleep Problem Solving: 5-Solves basic problems: With no assist  Memory Memory: 4-Recognizes or recalls 75 - 89% of the time/requires cueing 10 - 24% of the time  Medical Problem List and Plan:  1. 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 DVT Prophylaxis/Anticoagulation: SQ heparin .Monitor platelet counts and any signs of DVT 3. Pain Management: tylenol as needed 4. Diabetes Mellitus controlled with peripheral neuropathy.HGB A1C 7.7.SSI. Controlled on Tradjenta 5mg , Lantus 10U qhs and  amaryl 2mg  daily     5. Neuropsych: This patient is capable of making decisions on his own behalf. 6. Skin/Wound Care: Routine skin checks 7. Fluids/Electrolytes/Nutrition:strict I&O.Follow up labs 8.Hypertension. Lisinopril discontinued 09/16/2014 secondary to mild elevation in creatinine to 1.85..(Patient had also been on Coreg 12.5 mg twice a day prior to admission and lisinopril 40 mg daily) Monitor with increased mobility and resume as needed 9. History throat cancer. Status post dissection with radiation chemotherapy 2011 10.Hypothyroidism.Synthroid 11.Diastolic CHF. Monitor for any signs of fluid overload 12.Hyperlipidemia. Crestor 13.GERD.Protonix  LOS (Days) 27 A FACE TO FACE EVALUATION WAS PERFORMED  Brian Hall 10/13/2014, 8:58 AM

## 2014-10-13 NOTE — Discharge Summary (Signed)
Discharge summary job [phone]

## 2014-10-13 NOTE — Progress Notes (Signed)
Occupational Therapy Discharge Summary  Patient Details  Name: Brian Hall MRN: [id number] Date of Birth: March 28, 1956  Today's Date: 10/13/2014 OT Individual Time: 0263-7858 OT Individual Time Calculation (min): 60 min    Patient has met 14 of 15 long term goals due to improved activity tolerance, improved balance, postural control, ability to compensate for deficits, improved use of  LEFT upper and LEFT lower extremity functionally, improved attention, improved awareness and improved coordination.  Patient to discharge at [facility] Assist level.  Patient's care partner is independent to provide the necessary physical assistance at discharge.    Reasons goals not met: Pt did not meet goal regarding to Min A for dynamic standing balance activities with ADLs as his balance tends to be Min - Mod A depending on task and fatigue.  Recommendation:  Patient will benefit from ongoing skilled OT services in home health setting to continue to advance functional skills in the area of BADL.  Equipment: TTB  Wife purchased STEDY and has BSC prior to discharge  Reasons for discharge: treatment goals met and discharge from hospital  Patient/family agrees with progress made and goals achieved: Yes   OT Intervention: Upon entering the room, pt supine in bed with no c/o pain this session. Pt performed supine>sit with close supervision from flattened bed. Squat pivot transfer from bed >wheelchair with steady assist. OT assisted pt into bathroom via wheelchair with for transfer onto TTB in walk in shower. Squat pivot Min A transfer wheelchair <> TTB for bathing at shower level. Pt utilized bath mit on L hand and LH sponge to increase I in bathing tasks. Pt requiring assistance to wash buttocks with lateral lean on TTB. Pt performed grooming at sink side with Mod I this session. Pt standing at sink side with Min - Mod A for balance while pulling up LB clothing. Pt seated in wheelchair with breakfast tray  in front of him upon exiting the room. Call bell and all needed items within reach.  OT Discharge Precautions/Restrictions  Precautions Precautions: Fall Precaution Comments: left inattention, labile, pusher to the L during gait Restrictions Weight Bearing Restrictions: No  Pain Pain Assessment Pain Assessment: No/denies pain Pain Score: 0-No pain ADL ADL ADL Comments: see FIM Cognition Overall Cognitive Status: Impaired/Different from baseline Arousal/Alertness: Awake/alert Orientation Level: Oriented X4 Attention: Selective;Alternating Focused Attention: Appears intact Sustained Attention: Appears intact Selective Attention: Appears intact Alternating Attention: Impaired Alternating Attention Impairment: Functional complex Memory: Impaired Memory Impairment: Decreased short term memory Decreased Short Term Memory: Functional basic Awareness: Impaired Awareness Impairment: Anticipatory impairment;Emergent impairment Safety/Judgment: Appears intact Comments: Increased safety awareness however continued to need cues for L UE placement during transfers for safety Sensation Coordination Gross Motor Movements are Fluid and Coordinated: No Fine Motor Movements are Fluid and Coordinated: No Coordination and Movement Description: Pt with decreased coordination in LUE/LE due to decreased functional use/strength.  Motor  Motor Motor: Hemiplegia;Abnormal postural alignment and control Motor - Discharge Observations: Pt continues to have decreased functional use of LUE/LE, decreased balance and decreased postural control.  Mobility  Bed Mobility Bed Mobility: Supine to Sit;Sit to Supine;Rolling Right Rolling Right: 5: Supervision Rolling Right Details: Verbal cues for precautions/safety;Verbal cues for technique;Verbal cues for sequencing Right Sidelying to Sit: 5: Supervision Right Sidelying to Sit Details: Verbal cues for sequencing;Verbal cues for technique;Verbal cues for  precautions/safety Supine to Sit: 5: Supervision Supine to Sit Details: Verbal cues for sequencing;Verbal cues for technique;Verbal cues for precautions/safety Sit to Supine: 5: Supervision Sit to  Supine - Details: Verbal cues for sequencing;Verbal cues for technique;Verbal cues for precautions/safety Transfers Sit to Stand: 4: Min assist Sit to Stand Details: Verbal cues for technique;Verbal cues for precautions/safety;Verbal cues for sequencing;Manual facilitation for weight shifting;Manual facilitation for weight bearing Stand to Sit: 4: Min assist Stand to Sit Details (indicate cue type and reason): Verbal cues for sequencing;Verbal cues for technique;Verbal cues for precautions/safety;Manual facilitation for weight shifting;Manual facilitation for weight bearing  Trunk/Postural Assessment  Cervical Assessment Cervical Assessment: Within Functional Limits Thoracic Assessment Thoracic Assessment: Within Functional Limits Lumbar Assessment Lumbar Assessment: Exceptions to [facility] Lumbar Strength Overall Lumbar Strength Comments: posterior pelvic tilt Postural Control Postural Control: Deficits on evaluation Righting Reactions: Improved in sitting, however still delayed in standing due to perceptual deficits.  Protective Responses: improved in sitting, however still delayed in standing due to perceptual deficits.   Balance Balance Balance Assessed: Yes Static Sitting Balance Static Sitting - Balance Support: Feet supported Static Sitting - Level of Assistance: 6: Modified independent (Device/Increase time) Dynamic Sitting Balance Dynamic Sitting - Balance Support: Feet supported;During functional activity Dynamic Sitting - Level of Assistance: 5: Stand by assistance Static Standing Balance Static Standing - Balance Support: Right upper extremity supported;During functional activity Static Standing - Level of Assistance: 4: Min assist;5: Stand by assistance Dynamic Standing  Balance Dynamic Standing - Balance Support: During functional activity Dynamic Standing - Level of Assistance: 4: Min assist Extremity/Trunk Assessment RUE Assessment RUE Assessment: Within Functional Limits LUE Assessment LUE Assessment: Exceptions to WFL LUE PROM (degrees) LUE Overall PROM Comments: WFL LUE Strength LUE Overall Strength: Deficits LUE Overall Strength Comments: 2-/5 for shoulder, wrist, and elbow, 0/5 in digits  See FIM for current functional status  [name] 10/13/2014, 11:40 AM

## 2014-10-13 NOTE — Plan of Care (Signed)
Problem: RH Wheelchair Mobility Goal: LTG Patient will propel w/c in community environment (PT) LTG: Patient will propel wheelchair in community environment, # of feet with assist (PT)  Outcome: Not Met (add Reason) Pt still needs min cues in community setting to avoid objects on the L.

## 2014-10-13 NOTE — Discharge Summary (Signed)
Brian Hall, Brian Hall NO.:  [id number]  MEDICAL RECORD NO.:  [id number]  LOCATION:  4W06C                        FACILITY:  [facility]  PHYSICIAN:  Charlett Blake, M.D.DATE OF BIRTH:  04/27/1956  DATE OF ADMISSION:  09/16/2014 DATE OF DISCHARGE:  10/14/2014                              DISCHARGE SUMMARY   DISCHARGE DIAGNOSES: 1. 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 2. Subcutaneous heparin for deep venous thrombosis prophylaxis. 3. Diabetes mellitus with peripheral neuropathy. 4. Hypertension. 5. History of throat cancer. 6. Hypothyroidism. 7. Diastolic congestive heart failure. 8. Hyperlipidemia. 9. Gastroesophageal reflux disease.  HISTORY OF PRESENT ILLNESS:  This is a 59 year old right-handed male who lives with his wife independent prior to admission.  He presented on September 12, 2014, with left-sided weakness and slurred speech.  MRI of the brain showed acute large territory right MCA territory infarct involving right frontal lobe as well as remote infarcts medial left occipital lobe and left cerebellar hemisphere.  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  Echocardiogram with ejection fraction of 35%.  No source of embolism.  Carotid Dopplers with right ICA occlusion.  The patient did not receive tPA.  Neurology consulted, maintained on aspirin as well as Plavix for CVA prophylaxis x3 months and Plavix alone.  Subcutaneous heparin for DVT prophylaxis.  Mild elevation in creatinine from baseline 1.31-1.85 and monitored.  The patient tolerating a regular diet.  Physical and occupational therapy ongoing.  The patient was admitted for a comprehensive rehab program.  PAST MEDICAL HISTORY:  See discharge diagnoses.  SOCIAL HISTORY:  Lives with spouse.  FUNCTIONAL HISTORY:  Prior to admission, independent.  Functional status upon admission to rehab service was +2  physical assist sit to stand, +2 safety and squat pivot transfers, min to mod assist for activities of daily living.  PHYSICAL EXAMINATION:  VITAL SIGNS:  Blood pressure 125/74, pulse 99, temperature 98, respirations 18. GENERAL:  This was an alert male, made good eye contact with examiner. He does have a right gaze preference.  Follows commands.  Oriented to person, place, and date of birth. LUNGS:  Clear to auscultation. CARDIAC:  Regular rate and rhythm. ABDOMEN:  Soft, nontender.  Good bowel sounds.  REHABILITATION HOSPITAL COURSE:  The patient was admitted to inpatient rehab services with therapies initiated on a 3-hour daily basis consisting of physical therapy, occupational therapy, speech therapy, and rehabilitation nursing.  The following issues were addressed during the patient's rehabilitation stay.  Pertaining to Brian Hall functional deficits secondary to right MCA infarct remained stable, he continued on Plavix therapy at this time.  He would follow up with Neurology Services.  Subcutaneous heparin for DVT prophylaxis.  Blood sugars well controlled.  Hemoglobin A1c of 7.7.  He remained on Amaryl as well as Tradjenta with full diabetic teaching.  Blood pressures controlled, monitored with lisinopril as well as Coreg.  He continued on hormone supplement for hypothyroidism.  He exhibited no signs of fluid overload.  Crestor ongoing for hyperlipidemia.  The patient received weekly collaborative interdisciplinary team conferences to discuss estimated length of stay, family teaching, and any barriers  to his discharge.  Ongoing training going with his wife.  He could self propel his wheelchair using right hemi technique at supervision level.  Single cues for scanning to the left.  Performed car transfers x3, wheelchair to car minimal guard.  Required some cues for correct setup for wheelchair.  Provided education in all areas of mobility.  Ambulating 65 feet with a hemi  walker in max assist.  Performed moderate assist squat pivot transfers for activities of daily living.  Performed bathing and showering while seated at tub bench, wife providing supervision.  Full family teaching was completed and plan was discharge to home.  DISCHARGE MEDICATIONS: 1. Coreg 12.5 mg p.o. b.i.d. 2. Plavix 75 mg p.o. daily. 3. Amaryl 2 mg p.o. daily. 4. Synthroid 88 mcg p.o. daily. 5. Tradjenta 5 mg p.o. daily. 6. Lisinopril 40 mg p.o. daily. 7. Protonix 80 mg p.o. daily. 8. Crestor 10 mg p.o. daily. 9. Ultram 50 mg p.o. every 6 hours as needed pain, dispense of 60     tablets.  DIET:  Diabetic diet.  SPECIAL INSTRUCTIONS:  The patient would follow up with Dr. Alysia Hall at the outpatient rehab center on November 08, 2014; Dr. Antony Hall in 1 month, call for appointment; Dr. Claretta Hall on October 27, 2014, medical management.  Ongoing therapies arranged as per rehab services.     Lauraine Rinne, P.A.   ______________________________ Charlett Blake, M.D.    DA/MEDQ  D:  10/13/2014  T:  10/13/2014  Job:  [id number]  cc:   Brian P. Leonie Man, MD Brian Hall, M.D.

## 2014-10-13 NOTE — Progress Notes (Signed)
Speech Language Pathology Discharge Summary  Patient Details  Name: Brian Hall MRN: [id number] Date of Birth: 06-06-1956  Today's Date: 10/13/2014 SLP Individual Time: 1330-1430  60 miutes   Skilled Therapeutic Interventions:  Skilled treatment session focused on addressing cognition goals and wrap up of patient and family education.  Patient completed MoCA with a score of 20/30 (score on admission 11/30) with deficits noted in calculations, delayed recall and executive functioning tasks.  Patient reports that math was never a strength for him but other deficits continue to hinder his overall independence; patient in agreement of need for continued skilled SLP services.  SLP also facilitated session with education regarding home recommendations and handout reviewed with patient and wife, who verbalized understanding.  Patient ready for discharge.   Patient has met 5 of 5 long term goals.  Patient to discharge at overall Supervision;Min level.  Reasons goals not met:  N/A  Clinical Impression/Discharge Summary: Patient has made functional gains and has met 5 out of 5 long term goals this admission due to improved speech intelligibility and overall cognition. Patient currently requires Supervision cues for basic cognitive tasks and requires Min cues for complex cognitive tasks. Patient progressed from a MoCA score of 11/30 to 20/30 with 26 or greater being [facility] during rehab stay. Patient and family education has been completed and patient will discharge home with 24 hour supervision from family. Additionally, patient would benefit from follow up SLP services at the next level of care to maximize his/her functional independence and reduce overall burden of care.   Care Partner:  Caregiver Able to Provide Assistance: Yes  Type of Caregiver Assistance: Cognitive;Physical  Recommendation:  Home Health SLP;24 hour supervision/assistance;Outpatient SLP  Rationale for SLP Follow Up: Maximize  cognitive function and independence;Reduce caregiver burden   Equipment: none   Reasons for discharge: Treatment goals met;Discharged from hospital   Patient/Family Agrees with Progress Made and Goals Achieved: Yes   See FIM for current functional status  Carmelia Roller., CCC-SLP [place] 10/13/2014, 9:30 PM

## 2014-10-13 NOTE — Plan of Care (Signed)
Problem: RH Balance Goal: LTG Patient will maintain dynamic standing with ADLs (OT) LTG: Patient will maintain dynamic standing balance with assist during activities of daily living (OT)  Outcome: Not Met (add Reason) Ranges from min- mod A

## 2014-10-14 ENCOUNTER — Other Ambulatory Visit: Payer: Self-pay | Admitting: *Deleted

## 2014-10-14 LAB — GLUCOSE, CAPILLARY: GLUCOSE-CAPILLARY: 101 mg/dL — AB (ref 70–99)

## 2014-10-14 MED ORDER — LEVOTHYROXINE SODIUM 88 MCG PO TABS: 0.8800 ug | ORAL_TABLET | Freq: Every day | ORAL | Status: DC

## 2014-10-14 MED ORDER — CLOPIDOGREL BISULFATE 75 MG PO TABS: 75.0000 mg | ORAL_TABLET | Freq: Every day | ORAL | Status: DC

## 2014-10-14 MED ORDER — LINAGLIPTIN 5 MG PO TABS: 5.0000 mg | ORAL_TABLET | Freq: Every day | ORAL | Status: DC

## 2014-10-14 MED ORDER — LISINOPRIL 40 MG PO TABS: 40.0000 mg | ORAL_TABLET | Freq: Every day | ORAL | Status: DC

## 2014-10-14 MED ORDER — CARVEDILOL 12.5 MG PO TABS: 12.5000 mg | ORAL_TABLET | Freq: Two times a day (BID) | ORAL | Status: DC

## 2014-10-14 MED ORDER — TRAMADOL HCL 50 MG PO TABS: 50.0000 mg | ORAL_TABLET | Freq: Four times a day (QID) | ORAL | Status: DC | PRN

## 2014-10-14 MED ORDER — PRAVASTATIN SODIUM 40 MG PO TABS
40.0000 mg | ORAL_TABLET | Freq: Every day | ORAL | Status: DC
Start: 2014-10-14 — End: 2014-11-29

## 2014-10-14 MED ORDER — GLIMEPIRIDE 2 MG PO TABS: 2.0000 mg | ORAL_TABLET | Freq: Every day | ORAL | Status: DC

## 2014-10-14 MED ORDER — OMEPRAZOLE 40 MG PO CPDR: 40.0000 mg | DELAYED_RELEASE_CAPSULE | Freq: Every day | ORAL | Status: DC

## 2014-10-14 NOTE — Progress Notes (Signed)
Social Work Discharge Note Discharge Note  The overall goal for the admission was met for:   Discharge location: Yes-HOME WITH WIFE WHO CAN PROVIDE 24 HR CARE-SON AVAILABLE TO HELP IN THE EVENINGS  Length of Stay: Yes-28 DAYS  Discharge activity level: Yes-MIN LEVEL WHEELCHAIR  Home/community participation: Yes  Services provided included: MD, RD, PT, OT, SLP, RN, CM, TR, Pharmacy, Neuropsych and SW  Financial Services: Private Insurance: [employer]  Follow-up services arranged: Home Health: [facility]-PT,OT,SP, DME: [facility] and Patient/Family has no preference for HH/DME agencies  Comments (or additional information):WIFE PURCHASED A [facility].  [facility] PENDING-WIFE TO FOLLOW UP WITH APPLICATION  Patient/Family verbalized understanding of follow-up arrangements: Yes  Individual responsible for coordination of the follow-up plan: SANDRA-WIFE  Confirmed correct DME delivered: Elease Hashimoto 10/14/2014    Elease Hashimoto

## 2014-10-14 NOTE — Progress Notes (Signed)
Subjective/Complaints:  No problems overnite Breathing well  Review of systems is significant for left-sided weakness.  Objective: Vital Signs: Blood pressure 108/91, pulse 50, temperature 97.9 F (36.6 C), temperature source Oral, resp. rate 16, weight 108.6 kg (239 lb 6.7 oz), SpO2 99 %. No results found. Results for orders placed or performed during the hospital encounter of 09/16/14 (from the past 72 hour(s))  Glucose, capillary     Status: Abnormal   Collection Time: 10/11/14 11:56 AM  Result Value Ref Range   Glucose-Capillary 129 (H) 70 - 99 mg/dL   Comment 1 Notify RN   Glucose, capillary     Status: Abnormal   Collection Time: 10/11/14  4:33 PM  Result Value Ref Range   Glucose-Capillary 65 (L) 70 - 99 mg/dL   Comment 1 Notify RN   Glucose, capillary     Status: None   Collection Time: 10/11/14  4:56 PM  Result Value Ref Range   Glucose-Capillary 95 70 - 99 mg/dL   Comment 1 Notify RN   Glucose, capillary     Status: None   Collection Time: 10/11/14  9:44 PM  Result Value Ref Range   Glucose-Capillary 91 70 - 99 mg/dL   Comment 1 Notify RN   Glucose, capillary     Status: None   Collection Time: 10/12/14  7:01 AM  Result Value Ref Range   Glucose-Capillary 79 70 - 99 mg/dL   Comment 1 Notify RN   Glucose, capillary     Status: Abnormal   Collection Time: 10/12/14 11:26 AM  Result Value Ref Range   Glucose-Capillary 141 (H) 70 - 99 mg/dL  Glucose, capillary     Status: None   Collection Time: 10/12/14  4:20 PM  Result Value Ref Range   Glucose-Capillary 88 70 - 99 mg/dL  Glucose, capillary     Status: None   Collection Time: 10/12/14  8:40 PM  Result Value Ref Range   Glucose-Capillary 86 70 - 99 mg/dL   Comment 1 Notify RN   Glucose, capillary     Status: None   Collection Time: 10/13/14  6:55 AM  Result Value Ref Range   Glucose-Capillary 89 70 - 99 mg/dL   Comment 1 Notify RN   Glucose, capillary     Status: Abnormal   Collection Time: 10/13/14  12:03 PM  Result Value Ref Range   Glucose-Capillary 112 (H) 70 - 99 mg/dL   Comment 1 Notify RN   Glucose, capillary     Status: Abnormal   Collection Time: 10/13/14  4:44 PM  Result Value Ref Range   Glucose-Capillary 149 (H) 70 - 99 mg/dL   Comment 1 Notify RN   Glucose, capillary     Status: None   Collection Time: 10/13/14 10:52 PM  Result Value Ref Range   Glucose-Capillary 88 70 - 99 mg/dL      Extremity:  No Edema  Neuro: Alert/Oriented, Cranial Nerve Abnormalities left central 7, Abnormal Sensory decreased sensation to light touch left upper and left lower extremity, Abnormal Motor 2/5 Left shoulder Add, 2 minus left biceps,1/5 finger ext/flex  3 minus/5 in hip knee extensor synergy left lower extremity 0/5 at the ankle  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  Functional deficits secondary to right MCA infarct  Stable for D/C today F/u PCP in 1-2 weeks F/u PM&R 3 weeks See D/C summary See D/C instructions FIM: FIM - Bathing Bathing Steps Patient Completed: Chest, Right Arm, Left Arm, Abdomen, Front perineal area, Right upper leg, Left upper leg, Right lower leg (including foot), Left lower leg (including foot) Bathing: 4: Min-Patient completes 8-9 56f 10 parts or 75+ percent  FIM - Upper Body Dressing/Undressing Upper body dressing/undressing steps patient completed: Thread/unthread right sleeve of pullover shirt/dresss, Thread/unthread left sleeve of pullover shirt/dress, Put head through opening of pull over shirt/dress, Pull shirt over trunk Upper body dressing/undressing: 5: Supervision: Safety issues/verbal cues FIM - Lower Body Dressing/Undressing Lower body dressing/undressing steps patient  completed: Thread/unthread right underwear leg, Thread/unthread left underwear leg, Thread/unthread right pants leg, Thread/unthread left pants leg, Don/Doff right shoe, Pull underwear up/down, Pull pants up/down Lower body dressing/undressing: 4: Min-Patient completed 75 plus % of tasks  FIM - Toileting Toileting steps completed by patient: Adjust clothing prior to toileting, Performs perineal hygiene, Adjust clothing after toileting Toileting Assistive Devices: Grab bar or rail for support Toileting: 4: Steadying assist  FIM - Radio producer Devices: Elevated toilet seat (drop arm) Toilet Transfers: 4-From toilet/BSC: Min A (steadying Pt. > 75%), 4-To toilet/BSC: Min A (steadying Pt. > 75%)  FIM - Bed/Chair Transfer Bed/Chair Transfer Assistive Devices: Arm rests, Orthosis Bed/Chair Transfer: 5: Supine > Sit: Supervision (verbal cues/safety issues), 5: Sit > Supine: Supervision (verbal cues/safety issues), 5: Bed > Chair or W/C: Supervision (verbal cues/safety issues), 5: Chair or W/C > Bed: Supervision (verbal cues/safety issues)  FIM - Locomotion: Wheelchair Distance: 200 Locomotion: Wheelchair: 6: Travels 150 ft or more, turns around, maneuvers to table, bed or toilet, negotiates 3% grade: maneuvers on rugs and over door sills independently FIM - Locomotion: Ambulation Locomotion: Ambulation Assistive Devices: Museum/gallery curator Ambulation/Gait Assistance: 2: Max assist Locomotion: Ambulation: 2: Travels 50 - 149 ft with maximal assistance (Pt: 25 - 49%)  Comprehension Comprehension Mode: Auditory Comprehension: 5-Understands complex 90% of the time/Cues < 10% of the time  Expression Expression Mode: Verbal Expression: 5-Expresses complex 90% of the time/cues < 10% of the time  Social Interaction Social Interaction: 6-Interacts appropriately with others with medication or extra time (anti-anxiety, antidepressant).  Problem Solving Problem Solving Mode:  Asleep Problem Solving: 5-Solves basic problems: With no assist  Memory Memory: 6-Assistive device: No helper  Medical Problem List and Plan:  1. 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 DVT Prophylaxis/Anticoagulation: SQ heparin .Monitor platelet counts and any signs of DVT 3. Pain Management: tylenol as needed 4. Diabetes Mellitus controlled with peripheral neuropathy.HGB A1C 7.7.SSI. Controlled on Tradjenta 5mg , Lantus 10U qhs and  amaryl 2mg  daily     5. Neuropsych: This patient is capable of making decisions on his own behalf. 6. Skin/Wound Care: Routine skin checks 7. Fluids/Electrolytes/Nutrition:strict I&O.Follow up labs 8.Hypertension. Lisinopril discontinued 09/16/2014 secondary to mild elevation in creatinine to 1.85..(Patient had also been on Coreg 12.5 mg twice a day prior to admission and lisinopril 40 mg daily) Monitor with increased mobility and resume as needed 9. History throat cancer. Status post dissection with radiation chemotherapy 2011 10.Hypothyroidism.Synthroid 11.Diastolic CHF. Monitor for any signs of fluid overload 12.Hyperlipidemia. Crestor 13.GERD.Protonix  LOS (Days) 28 A FACE TO FACE EVALUATION WAS PERFORMED  Amiley Shishido E 10/14/2014, 8:56 AM

## 2014-10-14 NOTE — Telephone Encounter (Signed)
Mrs Pring called to day that he did not get a prescription for plavix when he left the hospital.  (It shows that it was printed by Linna Hoff) but I went ahead and sent it electronically to the pharmacy and notified Mrs Stubblefield.

## 2014-10-14 NOTE — Progress Notes (Signed)
Pt. Got d/c papers and instructions.Pt. Ready to go home with wife.

## 2014-10-14 NOTE — Discharge Instructions (Signed)
Inpatient Rehab Discharge Instructions  Brian Hall Discharge date and time: No discharge date for patient encounter.   Activities/Precautions/ Functional Status: Activity: activity as tolerated Diet: diabetic diet Wound Care: none needed Functional status:  ___ No restrictions     ___ Walk up steps independently ___ 24/7 supervision/assistance   ___ Walk up steps with assistance ___ Intermittent supervision/assistance  ___ Bathe/dress independently ___ Walk with walker     ___ Bathe/dress with assistance ___ Walk Independently    ___ Shower independently _x STROKE/TIA DISCHARGE INSTRUCTIONS SMOKING Cigarette smoking nearly doubles your risk of having a stroke & is the single most alterable risk factor  If you smoke or have smoked in the last 12 months, you are advised to quit smoking for your health.  Most of the excess cardiovascular risk related to smoking disappears within a year of stopping.  Ask you doctor about anti-smoking medications  [place] Quit Line: 1-800-QUIT NOW  Free Smoking Cessation Classes [phone]  CHOLESTEROL Know your levels; limit fat & cholesterol in your diet  Lipid Panel     Component Value Date/Time   CHOL 174 09/12/2014 1041   TRIG 271* 09/12/2014 1041   HDL 32* 09/12/2014 1041   CHOLHDL 5.4 09/12/2014 1041   VLDL 54* 09/12/2014 1041   [place] 88 09/12/2014 1041      Many patients benefit from treatment even if their cholesterol is at goal.  Goal: Total Cholesterol (CHOL) less than 160  Goal:  Triglycerides (TRIG) less than 150  Goal:  HDL greater than 40  Goal:  LDL (LDLCALC) less than 100   BLOOD PRESSURE American Stroke Association blood pressure target is less that 120/80 mm/Hg  Your discharge blood pressure is:  BP: 127/73 mmHg  Monitor your blood pressure  Limit your salt and alcohol intake  Many individuals will require more than one medication for high blood pressure  DIABETES (A1c is a blood sugar average for last 3  months) Goal HGBA1c is under 7% (HBGA1c is blood sugar average for last 3 months)  Diabetes:     Lab Results  Component Value Date   HGBA1C 7.7* 09/12/2014     Your HGBA1c can be lowered with medications, healthy diet, and exercise.  Check your blood sugar as directed by your physician  Call your physician if you experience unexplained or low blood sugars.  PHYSICAL ACTIVITY/REHABILITATION Goal is 30 minutes at least 4 days per week  Activity: Increase activity slowly, Therapies: Physical Therapy: Home Health Return to work:   Activity decreases your risk of heart attack and stroke and makes your heart stronger.  It helps control your weight and blood pressure; helps you relax and can improve your mood.  Participate in a regular exercise program.  Talk with your doctor about the best form of exercise for you (dancing, walking, swimming, cycling).  DIET/WEIGHT Goal is to maintain a healthy weight  Your discharge diet is: Diet heart healthy/carb modified  liquids Your height is:    Your current weight is: Weight: 108.6 kg (239 lb 6.7 oz) Your Body Mass Index (BMI) is:     Following the type of diet specifically designed for you will help prevent another stroke.  Your goal weight range is:    Your goal Body Mass Index (BMI) is 19-24.  Healthy food habits can help reduce 3 risk factors for stroke:  High cholesterol, hypertension, and excess weight.  RESOURCES Stroke/Support Group:  Call [facility]  PATIENT Stroke warning signs and symptoms How to activate emergency medical system (call 911). Medications prescribed at discharge. Need for follow-up after discharge. Personal risk factors for stroke. Pneumonia vaccine given:  Flu vaccine given:  My questions have been answered, the writing is legible, and I understand these instructions.  I will adhere to these goals & educational materials that have been provided to me after my  discharge from the hospital.    __ Walk with assistance    ___ Shower with assistance ___ No alcohol     ___ Return to work/school ________  Special Instructions:     COMMUNITY REFERRALS UPON DISCHARGE:    Home Health:   PT, OT, [place] YWVPX:[phone] Date of last service:10/14/2014   Medical Equipment/Items Ordered:WHEELCHAIR, HEMI-WALKER & TUB BENCH  Agency/Supplier:[facility]   [phone] Other:[facility] APPLICATION WIFE TO CONTINUE FOLLOWING UP ON THIS  GENERAL COMMUNITY RESOURCES FOR PATIENT/FAMILY: Support Groups:[facility] SUPPORT GROUP  My questions have been answered and I understand these instructions. I will adhere to these goals and the provided educational materials after my discharge from the hospital.  Patient/Caregiver Signature _______________________________ Date __________  Clinician Signature _______________________________________ Date __________  Please bring this form and your medication list with you to all your follow-up doctor's appointments.

## 2014-10-25 NOTE — Discharge Summary (Addendum)
This note is in addendum to the discharge summary by Dr. Denton Brick on 09/20/14 at 1:38pm.    Please note that the discharge summary has some errors, including the admission and discharge dates.  These should be:   Admission: 09/12/14 Discharge: 09/16/14  Also, disposition is to Inpatient Rehab.    Gilles Chiquito, MD [phone]

## 2014-10-27 ENCOUNTER — Encounter: Payer: Self-pay | Admitting: Family Medicine

## 2014-10-27 ENCOUNTER — Ambulatory Visit (INDEPENDENT_AMBULATORY_CARE_PROVIDER_SITE_OTHER): Payer: 59 | Admitting: Family Medicine

## 2014-10-27 VITALS — BP 154/84 | HR 55 | Temp 97.8°F | Ht 71.0 in | Wt 238.0 lb

## 2014-10-27 DIAGNOSIS — E1142 Type 2 diabetes mellitus with diabetic polyneuropathy: Secondary | ICD-10-CM

## 2014-10-27 DIAGNOSIS — G8190 Hemiplegia, unspecified affecting unspecified side: Secondary | ICD-10-CM

## 2014-10-27 DIAGNOSIS — R414 Neurologic neglect syndrome: Secondary | ICD-10-CM

## 2014-10-27 DIAGNOSIS — E039 Hypothyroidism, unspecified: Secondary | ICD-10-CM | POA: Diagnosis not present

## 2014-10-27 DIAGNOSIS — I10 Essential (primary) hypertension: Secondary | ICD-10-CM | POA: Diagnosis not present

## 2014-10-27 DIAGNOSIS — I639 Cerebral infarction, unspecified: Secondary | ICD-10-CM

## 2014-10-27 DIAGNOSIS — E1140 Type 2 diabetes mellitus with diabetic neuropathy, unspecified: Secondary | ICD-10-CM | POA: Diagnosis not present

## 2014-10-27 DIAGNOSIS — G8194 Hemiplegia, unspecified affecting left nondominant side: Secondary | ICD-10-CM

## 2014-10-27 LAB — POCT CBC
GRANULOCYTE PERCENT: 74.3 % (ref 37–80)
HCT, POC: 43.6 % (ref 43.5–53.7)
HEMOGLOBIN: 14 g/dL — AB (ref 14.1–18.1)
LYMPH, POC: 1.4 (ref 0.6–3.4)
MCH, POC: 29.9 pg (ref 27–31.2)
MCHC: 32.2 g/dL (ref 31.8–35.4)
MCV: 93 fL (ref 80–97)
MPV: 8.9 fL (ref 0–99.8)
PLATELET COUNT, POC: 124 10*3/uL — AB (ref 142–424)
POC Granulocyte: 5.8 (ref 2–6.9)
POC LYMPH %: 18.5 % (ref 10–50)
RBC: 4.69 M/uL (ref 4.69–6.13)
RDW, POC: 14.5 %
WBC: 7.8 10*3/uL (ref 4.6–10.2)

## 2014-10-27 MED ORDER — NEBIVOLOL HCL 10 MG PO TABS: 10.0000 mg | ORAL_TABLET | Freq: Every day | ORAL | Status: DC

## 2014-10-27 MED ORDER — ASPIRIN 81 MG PO TABS: 81.0000 mg | ORAL_TABLET | Freq: Every day | ORAL | Status: DC

## 2014-10-27 NOTE — Patient Instructions (Signed)
DASH Eating Plan DASH stands for "Dietary Approaches to Stop Hypertension." The DASH eating plan is a healthy eating plan that has been shown to reduce high blood pressure (hypertension). Additional health benefits may include reducing the risk of type 2 diabetes mellitus, heart disease, and stroke. The DASH eating plan may also help with weight loss. WHAT DO I NEED TO KNOW ABOUT THE DASH EATING PLAN? For the DASH eating plan, you will follow these general guidelines:  Choose foods with a percent daily value for sodium of less than 5% (as listed on the food label).  Use salt-free seasonings or herbs instead of table salt or sea salt.  Check with your health care provider or pharmacist before using salt substitutes.  Eat lower-sodium products, often labeled as "lower sodium" or "no salt added."  Eat fresh foods.  Eat more vegetables, fruits, and low-fat dairy products.  Choose whole grains. Look for the word "whole" as the first word in the ingredient list.  Choose fish and skinless chicken or turkey more often than red meat. Limit fish, poultry, and meat to 6 oz (170 g) each day.  Limit sweets, desserts, sugars, and sugary drinks.  Choose heart-healthy fats.  Limit cheese to 1 oz (28 g) per day.  Eat more home-cooked food and less restaurant, buffet, and fast food.  Limit fried foods.  Cook foods using methods other than frying.  Limit canned vegetables. If you do use them, rinse them well to decrease the sodium.  When eating at a restaurant, ask that your food be prepared with less salt, or no salt if possible. WHAT FOODS CAN I EAT? Seek help from a dietitian for individual calorie needs. Grains Whole grain or whole wheat bread. Brown rice. Whole grain or whole wheat pasta. Quinoa, bulgur, and whole grain cereals. Low-sodium cereals. Corn or whole wheat flour tortillas. Whole grain cornbread. Whole grain crackers. Low-sodium crackers. Vegetables Fresh or frozen vegetables  (raw, steamed, roasted, or grilled). Low-sodium or reduced-sodium tomato and vegetable juices. Low-sodium or reduced-sodium tomato sauce and paste. Low-sodium or reduced-sodium canned vegetables.  Fruits All fresh, canned (in natural juice), or frozen fruits. Meat and Other Protein Products Ground beef (85% or leaner), grass-fed beef, or beef trimmed of fat. Skinless chicken or turkey. Ground chicken or turkey. Pork trimmed of fat. All fish and seafood. Eggs. Dried beans, peas, or lentils. Unsalted nuts and seeds. Unsalted canned beans. Dairy Low-fat dairy products, such as skim or 1% milk, 2% or reduced-fat cheeses, low-fat ricotta or cottage cheese, or plain low-fat yogurt. Low-sodium or reduced-sodium cheeses. Fats and Oils Tub margarines without trans fats. Light or reduced-fat mayonnaise and salad dressings (reduced sodium). Avocado. Safflower, olive, or canola oils. Natural peanut or almond butter. Other Unsalted popcorn and pretzels. The items listed above may not be a complete list of recommended foods or beverages. Contact your dietitian for more options. WHAT FOODS ARE NOT RECOMMENDED? Grains White bread. White pasta. White rice. Refined cornbread. Bagels and croissants. Crackers that contain trans fat. Vegetables Creamed or fried vegetables. Vegetables in a cheese sauce. Regular canned vegetables. Regular canned tomato sauce and paste. Regular tomato and vegetable juices. Fruits Dried fruits. Canned fruit in light or heavy syrup. Fruit juice. Meat and Other Protein Products Fatty cuts of meat. Ribs, chicken wings, bacon, sausage, bologna, salami, chitterlings, fatback, hot dogs, bratwurst, and packaged luncheon meats. Salted nuts and seeds. Canned beans with salt. Dairy Whole or 2% milk, cream, half-and-half, and cream cheese. Whole-fat or sweetened yogurt. Full-fat   cheeses or blue cheese. Nondairy creamers and whipped toppings. Processed cheese, cheese spreads, or cheese  curds. Condiments Onion and garlic salt, seasoned salt, table salt, and sea salt. Canned and packaged gravies. Worcestershire sauce. Tartar sauce. Barbecue sauce. Teriyaki sauce. Soy sauce, including reduced sodium. Steak sauce. Fish sauce. Oyster sauce. Cocktail sauce. Horseradish. Ketchup and mustard. Meat flavorings and tenderizers. Bouillon cubes. Hot sauce. Tabasco sauce. Marinades. Taco seasonings. Relishes. Fats and Oils Butter, stick margarine, lard, shortening, ghee, and bacon fat. Coconut, palm kernel, or palm oils. Regular salad dressings. Other Pickles and olives. Salted popcorn and pretzels. The items listed above may not be a complete list of foods and beverages to avoid. Contact your dietitian for more information. WHERE CAN I FIND MORE INFORMATION? National Heart, Lung, and Blood Institute: travelstabloid.com Document Released: 08/08/2011 Document Revised: 01/03/2014 Document Reviewed: 06/23/2013 [facility] Patient Information 2015 [place], [place]. This information is not intended to replace advice given to you by your health care provider. Make sure you discuss any questions you have with your health care provider. Basic Carbohydrate Counting for Diabetes Mellitus Carbohydrate counting is a method for keeping track of the amount of carbohydrates you eat. Eating carbohydrates naturally increases the level of sugar (glucose) in your blood, so it is important for you to know the amount that is okay for you to have in every meal. Carbohydrate counting helps keep the level of glucose in your blood within normal limits. The amount of carbohydrates allowed is different for every person. A dietitian can help you calculate the amount that is right for you. Once you know the amount of carbohydrates you can have, you can count the carbohydrates in the foods you want to eat. Carbohydrates are found in the following foods:  Grains, such as breads and  cereals.  Dried beans and soy products.  Starchy vegetables, such as potatoes, peas, and corn.  Fruit and fruit juices.  Milk and yogurt.  Sweets and snack foods, such as cake, cookies, candy, chips, soft drinks, and fruit drinks. CARBOHYDRATE COUNTING There are two ways to count the carbohydrates in your food. You can use either of the methods or a combination of both. Reading the "Nutrition Facts" on [place] The "Nutrition Facts" is an area that is included on the labels of almost all packaged food and beverages in the [place]. It includes the serving size of that food or beverage and information about the nutrients in each serving of the food, including the grams (g) of carbohydrate per serving.  Decide the number of servings of this food or beverage that you will be able to eat or drink. Multiply that number of servings by the number of grams of carbohydrate that is listed on the label for that serving. The total will be the amount of carbohydrates you will be having when you eat or drink this food or beverage. Learning Standard Serving Sizes of Food When you eat food that is not packaged or does not include "Nutrition Facts" on the label, you need to measure the servings in order to count the amount of carbohydrates.A serving of most carbohydrate-rich foods contains about 15 g of carbohydrates. The following list includes serving sizes of carbohydrate-rich foods that provide 15 g ofcarbohydrate per serving:   1 slice of bread (1 oz) or 1 six-inch tortilla.    of a hamburger bun or English muffin.  4-6 crackers.   cup unsweetened dry cereal.    cup hot cereal.   cup rice or pasta.  cup mashed potatoes or  of a large baked potato.  1 cup fresh fruit or one small piece of fruit.    cup canned or frozen fruit or fruit juice.  1 cup milk.   cup plain fat-free yogurt or yogurt sweetened with artificial sweeteners.   cup cooked dried beans or starchy  vegetable, such as peas, corn, or potatoes.  Decide the number of standard-size servings that you will eat. Multiply that number of servings by 15 (the grams of carbohydrates in that serving). For example, if you eat 2 cups of strawberries, you will have eaten 2 servings and 30 g of carbohydrates (2 servings x 15 g = 30 g). For foods such as soups and casseroles, in which more than one food is mixed in, you will need to count the carbohydrates in each food that is included. EXAMPLE OF CARBOHYDRATE COUNTING Sample Dinner  3 oz chicken breast.   cup of brown rice.   cup of corn.  1 cup milk.   1 cup strawberries with sugar-free whipped topping.  Carbohydrate Calculation Step 1: Identify the foods that contain carbohydrates:   Rice.   Corn.   Milk.   Strawberries. Step 2:Calculate the number of servings eaten of each:   2 servings of rice.   1 serving of corn.   1 serving of milk.   1 serving of strawberries. Step 3: Multiply each of those number of servings by 15 g:   2 servings of rice x 15 g = 30 g.   1 serving of corn x 15 g = 15 g.   1 serving of milk x 15 g = 15 g.   1 serving of strawberries x 15 g = 15 g. Step 4: Add together all of the amounts to find the total grams of carbohydrates eaten: 30 g + 15 g + 15 g + 15 g = 75 g. Document Released: 08/19/2005 Document Revised: 01/03/2014 Document Reviewed: 07/16/2013 [facility] Patient Information 2015 [place], [place]. This information is not intended to replace advice given to you by your health care provider. Make sure you discuss any questions you have with your health care provider.

## 2014-10-27 NOTE — Progress Notes (Signed)
Subjective:  Patient ID: Brian Hall, male    DOB: 06-Jul-1956  Age: 59 y.o. MRN: [id number]  CC: Hospitalization Follow-up   HPI Brian Hall presents for posthospitalization care for a stroke. This is a gentleman known to me for over a decade who has been followed for diabetes and hypertension. 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 He has had a cardiomyopathy as well. Unfortunately he has been overweight and not always monitored his diet as closely as recommended. He has been under treatment for elevated blood pressure/hypertension for many years as well. He has been taking pravastatin for his cholesterol and other statins in the past as well. Multiple risk factors led to a CVA approximately 2 months ago. He was admitted to [facility] and subsequently went through rehabilitation inpatient. He has been home now for approximately 2 weeks and just got his first home physical therapy treatment yesterday. He has a fairly dense left hemiparesis. He is unable to walk. He has been unable to use the left hand or elbow he can raise the left upper extremity by elevating the shoulder and slightly abducting the shoulder on the left. He has been monitoring his sugar closely and brings in several readings today. These are reviewed with him showing readings in the 100 to 150s. He needs to have his medication reviewed and potentially adjusted to his outpatient situation. Apparently he took insulin on a sliding scale while hospitalized and was sent home with glimepiride and Tradjenta.  History Zebedee has a past medical history of Hypertension; Diabetes mellitus; Obesity; CHF (congestive heart failure); SOB (shortness of breath); Hyperkalemia; Renal insufficiency; Cardiogenic shock; Cardiac LV ejection fraction 10-20%; Hyperlipemia; Hypothyroidism; and Throat cancer (2011).   He has past surgical history that includes Cardiac catheterization and Neck dissection  (Right, 2011).   His family history includes Hypertension in his father and mother.He reports that he has never smoked. He does not have any smokeless tobacco history on file. He reports that he does not drink alcohol or use illicit drugs.  Current Outpatient Prescriptions on File Prior to Visit  Medication Sig Dispense Refill  . carvedilol (COREG) 12.5 MG tablet Take 1 tablet (12.5 mg total) by mouth 2 (two) times daily. 60 tablet 1  . cholecalciferol (VITAMIN D) 400 UNITS TABS tablet Take 400 Units by mouth daily.    . clopidogrel (PLAVIX) 75 MG tablet Take 1 tablet (75 mg total) by mouth daily. 30 tablet 1  . glimepiride (AMARYL) 2 MG tablet Take 1 tablet (2 mg total) by mouth daily with breakfast. 30 tablet 1  . linagliptin (TRADJENTA) 5 MG TABS tablet Take 1 tablet (5 mg total) by mouth daily. 30 tablet 1  . lisinopril (PRINIVIL,ZESTRIL) 40 MG tablet Take 1 tablet (40 mg total) by mouth daily. 30 tablet 1  . Multiple Vitamins-Minerals (MULTIVITAMIN WITH MINERALS) tablet Take 1 tablet by mouth daily.    Marland Kitchen omeprazole (PRILOSEC) 40 MG capsule Take 1 capsule (40 mg total) by mouth daily. 30 capsule 1  . pravastatin (PRAVACHOL) 40 MG tablet Take 1 tablet (40 mg total) by mouth daily. 30 tablet 1  . traMADol (ULTRAM) 50 MG tablet Take 1 tablet (50 mg total) by mouth every 6 (six) hours as needed for moderate pain. 60 tablet 0   No current facility-administered medications on file prior to visit.    ROS Review of Systems  Constitutional: Negative for fever, chills, diaphoresis and unexpected weight change.  HENT: Negative for congestion, hearing loss, rhinorrhea, sore throat and trouble swallowing.   Respiratory: Negative for cough, chest tightness, shortness of breath and wheezing.   Gastrointestinal: Negative for nausea, vomiting, abdominal pain, diarrhea, constipation and abdominal distention.  Endocrine: Negative for cold intolerance and heat intolerance.  Genitourinary: Negative for  dysuria, hematuria and flank pain.  Musculoskeletal: Negative for joint swelling and arthralgias.       Left-sided hemiparesis. wheelchair-bound  Skin: Negative for rash.  Neurological: Positive for weakness (dense left hemiparesis) and numbness. Negative for dizziness, tremors, seizures, syncope, speech difficulty and headaches.  Psychiatric/Behavioral: Negative for dysphoric mood, decreased concentration and agitation. The patient is not nervous/anxious.     Objective:  BP 154/84 mmHg  Pulse 55  Temp(Src) 97.8 F (36.6 C) (Oral)  Ht 5' 11"  (1.803 m)  Wt 238 lb (107.956 kg)  BMI 33.21 kg/m2  BP Readings from Last 3 Encounters:  10/27/14 154/84  09/16/14 147/116    Wt Readings from Last 3 Encounters:  10/27/14 238 lb (107.956 kg)  09/12/14 250 lb (113.399 kg)     Physical Exam  Constitutional: He is oriented to person, place, and time. He appears well-developed and well-nourished. No distress.  HENT:  Head: Normocephalic and atraumatic.  Right Ear: External ear normal.  Left Ear: External ear normal.  Nose: Nose normal.  Mouth/Throat: Oropharynx is clear and moist.  Eyes: Conjunctivae and EOM are normal. Pupils are equal, round, and reactive to light.  Neck: Normal range of motion. Neck supple. No thyromegaly present.  Cardiovascular: Normal rate, regular rhythm and normal heart sounds.   No murmur heard. Pulmonary/Chest: Effort normal and breath sounds normal. No respiratory distress. He has no wheezes. He has no rales.  Abdominal: Soft. Bowel sounds are normal. He exhibits no distension. There is no tenderness.  Musculoskeletal:  Left hemiparesis with minimal movement of the left upper extremity with the exception of shoulder elevation and slight abduction of the shoulder. There is flaccid paralysis of the hand and wrist. There is minimal flexion of the elbow. Circulation is intact sensation is diminished. Left lower extremity has flaccid hemi-paresis. At the knees there  is minimal extension the hip is weak but in mobile. He is wheelchair-bound and exam of the lower extremity is somewhat limited based on this.  Lymphadenopathy:    He has no cervical adenopathy.  Neurological: He is alert and oriented to person, place, and time. He has normal reflexes.  Skin: Skin is warm and dry.  Psychiatric: He has a normal mood and affect. His behavior is normal. Judgment and thought content normal.    Lab Results  Component Value Date   HGBA1C 7.7* 09/12/2014    Lab Results  Component Value Date   WBC 7.8 10/27/2014   HGB 14.0* 10/27/2014   HCT 43.6 10/27/2014   PLT 161 09/19/2014   GLUCOSE 71 10/27/2014   CHOL 174 09/12/2014   TRIG 271* 09/12/2014   HDL 32* 09/12/2014   LDLCALC 88 09/12/2014   ALT 15 10/27/2014   AST 15 10/27/2014   NA 144 10/27/2014   K 5.0 10/27/2014   CL 106 10/27/2014   CREATININE 1.15 10/27/2014   BUN 19 10/27/2014   CO2 24 10/27/2014   TSH 3.690 10/27/2014   INR 0.95 09/12/2014   HGBA1C 7.7* 09/12/2014    No results found.  Assessment & Plan:   Shalik was seen today for hospitalization follow-up.  Diagnoses and all orders for this visit:  Essential hypertension Orders: -  Amb ref to Medical Nutrition Therapy-MNT -     POCT CBC -     TSH -     CMP14+EGFR -     T4, free  CVA (cerebral vascular accident)  Type 2 diabetes mellitus with peripheral neuropathy Orders: -     Amb ref to Medical Nutrition Therapy-MNT -     POCT CBC -     TSH -     CMP14+EGFR -     T4, free  Left hemiparesis  Left-sided neglect  Hypothyroidism, unspecified hypothyroidism type Orders: -     Amb ref to Medical Nutrition Therapy-MNT -     POCT CBC -     TSH -     CMP14+EGFR -     T4, free  Other orders -     aspirin 81 MG tablet; Take 1 tablet (81 mg total) by mouth daily. -     nebivolol (BYSTOLIC) 10 MG tablet; Take 1 tablet (10 mg total) by mouth daily.   I have discontinued Mr. Herchel Hopkin. I am also having him  start on aspirin and nebivolol. Additionally, I am having him maintain his multivitamin with minerals, cholecalciferol, carvedilol, glimepiride, linagliptin, lisinopril, omeprazole, pravastatin, traMADol, clopidogrel, and levothyroxine.  Meds ordered this encounter  Medications  . DISCONTD: JANUVIA 100 MG tablet    Sig: Take 1 tablet by mouth daily.    Refill:  0  . levothyroxine (SYNTHROID, LEVOTHROID) 88 MCG tablet    Sig: Take 88 mcg by mouth daily before breakfast.  . aspirin 81 MG tablet    Sig: Take 1 tablet (81 mg total) by mouth daily.    Dispense:  30 tablet    Refill:  11  . nebivolol (BYSTOLIC) 10 MG tablet    Sig: Take 1 tablet (10 mg total) by mouth daily.    Dispense:  30 tablet    Refill:  5   Follow-up: Return in about 1 month (around 11/25/2014).  Claretta Fraise, M.D.

## 2014-10-28 LAB — CMP14+EGFR
ALT: 15 IU/L (ref 0–44)
AST: 15 IU/L (ref 0–40)
Albumin/Globulin Ratio: 1.5 (ref 1.1–2.5)
Albumin: 4 g/dL (ref 3.5–5.5)
Alkaline Phosphatase: 54 IU/L (ref 39–117)
BUN / CREAT RATIO: 17 (ref 9–20)
BUN: 19 mg/dL (ref 6–24)
Bilirubin Total: 0.6 mg/dL (ref 0.0–1.2)
CALCIUM: 9.5 mg/dL (ref 8.7–10.2)
CO2: 24 mmol/L (ref 18–29)
Chloride: 106 mmol/L (ref 97–108)
Creatinine, Ser: 1.15 mg/dL (ref 0.76–1.27)
GFR calc Af Amer: 82 mL/min/{1.73_m2} (ref >59)
GFR calc non Af Amer: 71 mL/min/{1.73_m2} (ref >59)
GLOBULIN, TOTAL: 2.7 g/dL (ref 1.5–4.5)
GLUCOSE: 71 mg/dL (ref 65–99)
Potassium: 5 mmol/L (ref 3.5–5.2)
Sodium: 144 mmol/L (ref 134–144)
TOTAL PROTEIN: 6.7 g/dL (ref 6.0–8.5)

## 2014-10-28 LAB — T4, FREE: Free T4: 1.62 ng/dL (ref 0.82–1.77)

## 2014-10-28 LAB — TSH: TSH: 3.69 u[IU]/mL (ref 0.450–4.500)

## 2014-11-08 ENCOUNTER — Encounter: Payer: 59 | Attending: Physical Medicine & Rehabilitation

## 2014-11-08 ENCOUNTER — Encounter: Payer: Self-pay | Admitting: Physical Medicine & Rehabilitation

## 2014-11-08 ENCOUNTER — Ambulatory Visit (HOSPITAL_BASED_OUTPATIENT_CLINIC_OR_DEPARTMENT_OTHER): Payer: 59 | Admitting: Physical Medicine & Rehabilitation

## 2014-11-08 VITALS — BP 119/68 | HR 50 | Resp 14

## 2014-11-08 DIAGNOSIS — G8110 Spastic hemiplegia affecting unspecified side: Secondary | ICD-10-CM

## 2014-11-08 DIAGNOSIS — I63511 Cerebral infarction due to unspecified occlusion or stenosis of right middle cerebral artery: Secondary | ICD-10-CM | POA: Insufficient documentation

## 2014-11-08 DIAGNOSIS — I69354 Hemiplegia and hemiparesis following cerebral infarction affecting left non-dominant side: Secondary | ICD-10-CM | POA: Insufficient documentation

## 2014-11-08 MED ORDER — TIZANIDINE HCL 2 MG PO TABS: 2.0000 mg | ORAL_TABLET | Freq: Three times a day (TID) | ORAL | Status: DC

## 2014-11-08 NOTE — Progress Notes (Signed)
   Subjective:    Patient ID: Brian Hall, male    DOB: [birth year], 57 y.o.   MRN: [id number]  HPI   Pain Inventory Average Pain 3 Pain Right Now 0 My pain is intermittent  In the last 24 hours, has pain interfered with the following? General activity 0 Relation with others 0 Enjoyment of life 0 What TIME of day is your pain at its worst? night Sleep (in general) Fair  Pain is worse with: some activites Pain improves with: heat/ice and medication Relief from Meds: 10  Mobility walk with assistance use a walker ability to climb steps?  no do you drive?  no use a wheelchair needs help with transfers  Function disabled: date disabled 09/12/14 I need assistance with the following:  dressing, bathing, toileting, meal prep, household duties and shopping  Neuro/Psych weakness numbness trouble walking anxiety  Prior Studies Any changes since last visit?  no  Physicians involved in your care Any changes since last visit?  no   Family History  Problem Relation Age of Onset  . Hypertension Mother   . Hypertension Father    History   Social History  . Marital Status: Legally Separated    Spouse Name: N/A  . Number of Children: N/A  . Years of Education: N/A   Occupational History  . truck driver    Social History Main Topics  . Smoking status: Never Smoker   . Smokeless tobacco: Not on file  . Alcohol Use: No  . Drug Use: No  . Sexual Activity: Not on file   Other Topics Concern  . None   Social History Narrative   Past Surgical History  Procedure Laterality Date  . Cardiac catheterization    . Neck dissection Right 2011    s/p resection of throat cancer with multiple nodes removed   Past Medical History  Diagnosis Date  . Hypertension   . Diabetes mellitus   . Obesity   . CHF (congestive heart failure)   . SOB (shortness of breath)   . Hyperkalemia   . Renal insufficiency   . Cardiogenic shock   . Cardiac LV ejection fraction 10-20%    . Hyperlipemia   . Hypothyroidism   . Throat cancer 2011    s/p neck dissection, radiation, chemo   BP 119/68 mmHg  Pulse 50  Resp 14  SpO2 97%  Opioid Risk Score:   Fall Risk Score: Moderate Fall Risk (6-13 points) (has hh PT currently, hanout given on fall prevention in the home) Review of Systems  Musculoskeletal: Positive for gait problem.  Neurological: Positive for weakness and numbness.  Psychiatric/Behavioral: The patient is nervous/anxious.   All other systems reviewed and are negative.      Objective:   Physical Exam        Assessment & Plan:

## 2014-11-08 NOTE — Patient Instructions (Signed)
Recommend Botox to left pectoralis muscle as well as left finger and wrist flexor muscles Our clerical staff will check into insurance coverage for you

## 2014-11-08 NOTE — Progress Notes (Signed)
   Subjective:    Patient ID: Brian Hall, male    DOB: 30-Aug-1956, 59 y.o.   MRN: [id number] 59 year old right-handed male who lives with his wife independent prior to admission.  He presented on September 12, 2014, with left-sided weakness and slurred speech.  MRI of the brain showed acute large territory right MCA territory infarct involving right frontal lobe as well as remote infarcts medial left occipital lobe and left cerebellar hemisphere.  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  Echocardiogram with ejection fraction of 35%.  No source of embolism. DATE OF ADMISSION:  09/16/2014 DATE OF DISCHARGE:  10/14/2014  HPI Patient having problems with left arm abduction as well as finger flexion.  Receiving home health PT and OT Has followed up with primary care physician  Wife is still assisting with bathing and dressing No falls at home  Having financial difficulties, on short-term disability converting to long-term and is also looking into SSI Filling out paperwork  Review of Systems     Objective:   Physical Exam  Constitutional: He is oriented to person, place, and time. He appears well-developed and well-nourished.  Neurological: He is alert and oriented to person, place, and time.  Psychiatric: He has a normal mood and affect.  Nursing note and vitals reviewed.   Modified Ashworth score 3 in the left finger flexors and wrist flexors 1 at the left bicep 3 at the left pectoralis muscle 1 at the hamstring Odor strength difficult to assess secondary to tone in the left upper extremity Brunnstrom 3 Left lower extremity strength 3 at the hip flexor 4 minus at the knee extensor Increased tone at the toe flexors on the left side as well as the ankle plantar flexors Left lower strength at the ankle 0 ankle dorsiflexion and 0 toe extension to minus ankle plantarflexion and 2 minus toe flexion Decreased left ankle range of motion    Assessment & Plan:    1. 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 This is persisting despite PT and OT Trial of Zanaflex 2 mg 3 times a day, may cause sedation  Recommend Botox left pectoralis as well as left finger and wrist flexor muscles, 300 units   Continue home health PT OT, at some point will need to transition to outpatient therapy, we'll try to find the closest outpatient rehabilitation to his home.  Follow up neurology in May, We did discuss recurrent stroke risk factor however I instructed him to discuss this in more detail with neurology during his appointment  Follow-up primary care for blood pressure management, diabetic management

## 2014-11-10 ENCOUNTER — Other Ambulatory Visit: Payer: Self-pay | Admitting: *Deleted

## 2014-11-10 MED ORDER — LEVOTHYROXINE SODIUM 88 MCG PO TABS: 88.0000 ug | ORAL_TABLET | Freq: Every day | ORAL | Status: DC

## 2014-11-10 NOTE — Progress Notes (Signed)
Pt's wife called in to ask for Levothyroxine refill Refill sent into [facility]

## 2014-11-14 DIAGNOSIS — I10 Essential (primary) hypertension: Secondary | ICD-10-CM | POA: Diagnosis not present

## 2014-11-14 DIAGNOSIS — E1140 Type 2 diabetes mellitus with diabetic neuropathy, unspecified: Secondary | ICD-10-CM | POA: Diagnosis not present

## 2014-11-14 DIAGNOSIS — Z8501 Personal history of malignant neoplasm of esophagus: Secondary | ICD-10-CM

## 2014-11-14 DIAGNOSIS — E038 Other specified hypothyroidism: Secondary | ICD-10-CM

## 2014-11-14 DIAGNOSIS — I69354 Hemiplegia and hemiparesis following cerebral infarction affecting left non-dominant side: Secondary | ICD-10-CM | POA: Diagnosis not present

## 2014-11-14 DIAGNOSIS — I5030 Unspecified diastolic (congestive) heart failure: Secondary | ICD-10-CM

## 2014-11-14 DIAGNOSIS — K219 Gastro-esophageal reflux disease without esophagitis: Secondary | ICD-10-CM

## 2014-11-14 DIAGNOSIS — I6931 Cognitive deficits following cerebral infarction: Secondary | ICD-10-CM | POA: Diagnosis not present

## 2014-11-22 ENCOUNTER — Telehealth: Payer: Self-pay | Admitting: *Deleted

## 2014-11-22 DIAGNOSIS — I63511 Cerebral infarction due to unspecified occlusion or stenosis of right middle cerebral artery: Secondary | ICD-10-CM

## 2014-11-22 DIAGNOSIS — G8110 Spastic hemiplegia affecting unspecified side: Secondary | ICD-10-CM

## 2014-11-22 NOTE — Telephone Encounter (Signed)
[facility] is closer.  Order placed for  Neuro rehab pt/ot

## 2014-11-22 NOTE — Telephone Encounter (Signed)
Wife calling stating that [facility] therapy has been completed and is aksing about the transition to outpt therapy. Do you know where you want this to be? They live in [place]

## 2014-11-22 NOTE — Telephone Encounter (Signed)
Please asked him if they would like to be referred to either [facility] or [name]. He requires PT OT [facility] office does not have any OT

## 2014-11-28 ENCOUNTER — Ambulatory Visit (INDEPENDENT_AMBULATORY_CARE_PROVIDER_SITE_OTHER): Payer: 59 | Admitting: Family Medicine

## 2014-11-28 ENCOUNTER — Encounter: Payer: Self-pay | Admitting: Family Medicine

## 2014-11-28 VITALS — BP 139/79 | HR 52 | Temp 97.0°F | Ht 71.0 in | Wt 220.0 lb

## 2014-11-28 DIAGNOSIS — E785 Hyperlipidemia, unspecified: Secondary | ICD-10-CM

## 2014-11-28 NOTE — Progress Notes (Signed)
Subjective:  Patient ID: OWENS HARA, male    DOB: 11/12/1955  Age: 59 y.o. MRN: [id number]  CC: Cerebrovascular Accident   HPI CARL BUTNER presents for follow-up on his CVA. He has residual left-sided weakness. He is undergoing rigorous physical therapy with his wife at home managing that. He underwent inpatient and rehabilitation's based physical therapy and is waiting for outpatient therapy to be ordered by his rehabilitation physician. Currently he is trying to apply for his disability from his employer. He is already been approved for Social Security disability. Tallin has minimal grip on the left. He has minimal strength at the left elbow. He can lift the left upper arm slightly. He depends on a wheelchair for ambulation currently.  History Samar has a past medical history of Hypertension; Diabetes mellitus; Obesity; CHF (congestive heart failure); SOB (shortness of breath); Hyperkalemia; Renal insufficiency; Cardiogenic shock; Cardiac LV ejection fraction 10-20%; Hyperlipemia; Hypothyroidism; and Throat cancer (2011).   He has past surgical history that includes Cardiac catheterization and Neck dissection (Right, 2011).   His family history includes Hypertension in his father and mother.He reports that he has never smoked. He does not have any smokeless tobacco history on file. He reports that he does not drink alcohol or use illicit drugs.  Current Outpatient Prescriptions on File Prior to Visit  Medication Sig Dispense Refill  . aspirin 81 MG tablet Take 1 tablet (81 mg total) by mouth daily. 30 tablet 11  . carvedilol (COREG) 12.5 MG tablet Take 1 tablet (12.5 mg total) by mouth 2 (two) times daily. 60 tablet 1  . cholecalciferol (VITAMIN D) 400 UNITS TABS tablet Take 400 Units by mouth daily.    . clopidogrel (PLAVIX) 75 MG tablet Take 1 tablet (75 mg total) by mouth daily. 30 tablet 1  . glimepiride (AMARYL) 2 MG tablet Take 1 tablet (2 mg total) by mouth daily with  breakfast. 30 tablet 1  . levothyroxine (SYNTHROID, LEVOTHROID) 88 MCG tablet Take 1 tablet (88 mcg total) by mouth daily before breakfast. 30 tablet 3  . linagliptin (TRADJENTA) 5 MG TABS tablet Take 1 tablet (5 mg total) by mouth daily. 30 tablet 1  . lisinopril (PRINIVIL,ZESTRIL) 40 MG tablet Take 1 tablet (40 mg total) by mouth daily. 30 tablet 1  . Multiple Vitamins-Minerals (MULTIVITAMIN WITH MINERALS) tablet Take 1 tablet by mouth daily.    . nebivolol (BYSTOLIC) 10 MG tablet Take 1 tablet (10 mg total) by mouth daily. 30 tablet 5  . omeprazole (PRILOSEC) 40 MG capsule Take 1 capsule (40 mg total) by mouth daily. 30 capsule 1  . pravastatin (PRAVACHOL) 40 MG tablet Take 1 tablet (40 mg total) by mouth daily. 30 tablet 1  . tiZANidine (ZANAFLEX) 2 MG tablet Take 1 tablet (2 mg total) by mouth 3 (three) times daily. 90 tablet 1  . traMADol (ULTRAM) 50 MG tablet Take 1 tablet (50 mg total) by mouth every 6 (six) hours as needed for moderate pain. 60 tablet 0   No current facility-administered medications on file prior to visit.    ROS Review of Systems  Constitutional: Positive for fatigue. Negative for fever, chills, diaphoresis and unexpected weight change.  HENT: Negative for congestion, hearing loss, rhinorrhea, sore throat and trouble swallowing.   Respiratory: Negative for cough, chest tightness, shortness of breath and wheezing.   Gastrointestinal: Negative for nausea, vomiting, abdominal pain, diarrhea, constipation and abdominal distention.  Endocrine: Negative for cold intolerance and heat intolerance.  Genitourinary: Negative for  dysuria, hematuria and flank pain.  Musculoskeletal: Negative for joint swelling and arthralgias.  Skin: Negative for rash.  Neurological: Positive for weakness (moderately dense left sided hemiplegia since CVA January 2016.). Negative for dizziness and headaches.  Psychiatric/Behavioral: Negative for dysphoric mood, decreased concentration and  agitation. The patient is not nervous/anxious.     Objective:  BP 139/79 mmHg  Pulse 52  Temp(Src) 97 F (36.1 C) (Oral)  Ht 5\' 11"  (1.803 m)  Wt 220 lb (99.791 kg)  BMI 30.70 kg/m2  BP Readings from Last 3 Encounters:  11/28/14 139/79  11/08/14 119/68  10/27/14 154/84    Wt Readings from Last 3 Encounters:  11/28/14 220 lb (99.791 kg)  10/27/14 238 lb (107.956 kg)  09/12/14 250 lb (113.399 kg)     Physical Exam  Constitutional: He is oriented to person, place, and time. He appears well-developed and well-nourished. No distress.  HENT:  Head: Normocephalic and atraumatic.  Right Ear: External ear normal.  Left Ear: External ear normal.  Nose: Nose normal.  Mouth/Throat: Oropharynx is clear and moist.  Eyes: Conjunctivae and EOM are normal. Pupils are equal, round, and reactive to light.  Neck: Normal range of motion. Neck supple. No thyromegaly present.  Cardiovascular: Normal rate, regular rhythm and normal heart sounds.   No murmur heard. Pulmonary/Chest: Effort normal and breath sounds normal. No respiratory distress. He has no wheezes. He has no rales.  Abdominal: Soft. Bowel sounds are normal. He exhibits no distension. There is no tenderness.  Musculoskeletal:  Universal left-sided weakness. Strength in the left upper extremity is 1-2/5 at best. Left lower extremity 1/5.  Lymphadenopathy:    He has no cervical adenopathy.  Neurological: He is alert and oriented to person, place, and time. He displays abnormal reflex (diminished on the left). No cranial nerve deficit. Coordination abnormal.  He demonstrates some emotional lability and becomes tearful easily as well as sentimental.  Skin: Skin is warm and dry.  Psychiatric: His behavior is normal. Judgment and thought content normal.    Lab Results  Component Value Date   HGBA1C 7.7* 09/12/2014    Lab Results  Component Value Date   WBC 7.8 10/27/2014   HGB 14.0* 10/27/2014   HCT 43.6 10/27/2014   PLT  161 09/19/2014   GLUCOSE 71 10/27/2014   CHOL 174 09/12/2014   TRIG 271* 09/12/2014   HDL 32* 09/12/2014   LDLCALC 88 09/12/2014   ALT 15 10/27/2014   AST 15 10/27/2014   NA 144 10/27/2014   K 5.0 10/27/2014   CL 106 10/27/2014   CREATININE 1.15 10/27/2014   BUN 19 10/27/2014   CO2 24 10/27/2014   TSH 3.690 10/27/2014   INR 0.95 09/12/2014   HGBA1C 7.7* 09/12/2014    No results found.  Assessment & Plan:   Randeep was seen today for cerebrovascular accident.  Diagnoses and all orders for this visit:  Hyperlipidemia LDL goal <70 Orders: -     NMR, lipoprofile   I am having Mr. Bara maintain his multivitamin with minerals, cholecalciferol, carvedilol, glimepiride, linagliptin, lisinopril, omeprazole, pravastatin, traMADol, clopidogrel, aspirin, nebivolol, tiZANidine, and levothyroxine.  No orders of the defined types were placed in this encounter.     Follow-up: No Follow-up on file.  Claretta Fraise, M.D.

## 2014-11-29 ENCOUNTER — Other Ambulatory Visit: Payer: Self-pay | Admitting: Family Medicine

## 2014-11-29 LAB — NMR, LIPOPROFILE
Cholesterol: 162 mg/dL (ref 100–199)
HDL CHOLESTEROL BY NMR: 25 mg/dL — AB (ref >39)
HDL Particle Number: 21.3 umol/L — ABNORMAL LOW (ref >=30.5)
LDL Particle Number: 1024 nmol/L — ABNORMAL HIGH (ref <1000)
LDL Size: 19.8 nm (ref >20.5)
LDL-C: 81 mg/dL (ref 0–99)
LP-IR Score: 70 — ABNORMAL HIGH (ref <=45)
SMALL LDL PARTICLE NUMBER: 675 nmol/L — AB (ref <=527)
TRIGLYCERIDES BY NMR: 278 mg/dL — AB (ref 0–149)

## 2014-11-29 MED ORDER — ROSUVASTATIN CALCIUM 20 MG PO TABS: 20.0000 mg | ORAL_TABLET | Freq: Every day | ORAL | Status: DC

## 2014-12-05 ENCOUNTER — Telehealth: Payer: Self-pay | Admitting: Family Medicine

## 2014-12-06 ENCOUNTER — Ambulatory Visit (HOSPITAL_BASED_OUTPATIENT_CLINIC_OR_DEPARTMENT_OTHER): Payer: 59 | Admitting: Physical Medicine & Rehabilitation

## 2014-12-06 ENCOUNTER — Encounter: Payer: Self-pay | Admitting: Physical Medicine & Rehabilitation

## 2014-12-06 ENCOUNTER — Encounter: Payer: 59 | Attending: Physical Medicine & Rehabilitation

## 2014-12-06 VITALS — BP 142/64 | HR 60 | Resp 14

## 2014-12-06 DIAGNOSIS — I63511 Cerebral infarction due to unspecified occlusion or stenosis of right middle cerebral artery: Secondary | ICD-10-CM | POA: Insufficient documentation

## 2014-12-06 DIAGNOSIS — G8110 Spastic hemiplegia affecting unspecified side: Secondary | ICD-10-CM | POA: Diagnosis not present

## 2014-12-06 NOTE — Patient Instructions (Addendum)
You received a Botox injection today. You may experience soreness at the needle injection sites. Please call us if any of the injection sites turns red after a couple days or if there is any drainage. You may experience muscle weakness as a result of Botox. This would improve with time but can take several weeks to improve. The Botox should start working in about one week. The Botox usually last 3 months. The injection can be repeated every 3 months as needed.  Use ice for 17min every 2 hours for the hematoma

## 2014-12-06 NOTE — Progress Notes (Signed)
Botox Injection for spasticity using needle EMG guidance  Dilution: 50 Units/ml Indication: Severe spasticity which interferes with ADL,mobility and/or  hygiene and is unresponsive to medication management and other conservative care Informed consent was obtained after describing risks and benefits of the procedure with the patient. This includes bleeding, bruising, infection, excessive weakness, or medication side effects. A REMS form is on file and signed. Needle: 25g 2" needle electrode Number of units per muscle  FCR50 FCU25 FDS50 FDP50 FPL25  2cmx2cm hematoma Left lateral mid forearm post procedure, no bleeding from injection site Ice applied for 70min, advise to reapply 10-20 min q 2hr this afternoon and evening All injections were done after obtaining appropriate EMG activity and after negative drawback for blood. The patient tolerated the procedure well. Post procedure instructions were given. A followup appointment was made.

## 2014-12-09 ENCOUNTER — Other Ambulatory Visit: Payer: Self-pay | Admitting: *Deleted

## 2014-12-09 MED ORDER — CLOPIDOGREL BISULFATE 75 MG PO TABS: 75.0000 mg | ORAL_TABLET | Freq: Every day | ORAL | Status: DC

## 2014-12-09 MED ORDER — GLIMEPIRIDE 2 MG PO TABS: 2.0000 mg | ORAL_TABLET | Freq: Every day | ORAL | Status: DC

## 2014-12-12 ENCOUNTER — Ambulatory Visit: Payer: 59 | Admitting: Physical Therapy

## 2014-12-12 ENCOUNTER — Ambulatory Visit: Payer: 59 | Attending: Physical Medicine & Rehabilitation | Admitting: Occupational Therapy

## 2014-12-12 ENCOUNTER — Encounter: Payer: Self-pay | Admitting: Occupational Therapy

## 2014-12-12 DIAGNOSIS — G8194 Hemiplegia, unspecified affecting left nondominant side: Secondary | ICD-10-CM

## 2014-12-12 DIAGNOSIS — R414 Neurologic neglect syndrome: Secondary | ICD-10-CM | POA: Insufficient documentation

## 2014-12-12 DIAGNOSIS — Z7409 Other reduced mobility: Secondary | ICD-10-CM

## 2014-12-12 DIAGNOSIS — I6931 Cognitive deficits following cerebral infarction: Secondary | ICD-10-CM | POA: Insufficient documentation

## 2014-12-12 DIAGNOSIS — R269 Unspecified abnormalities of gait and mobility: Secondary | ICD-10-CM

## 2014-12-12 DIAGNOSIS — R201 Hypoesthesia of skin: Secondary | ICD-10-CM | POA: Diagnosis not present

## 2014-12-12 DIAGNOSIS — I69398 Other sequelae of cerebral infarction: Secondary | ICD-10-CM | POA: Diagnosis not present

## 2014-12-12 DIAGNOSIS — I69354 Hemiplegia and hemiparesis following cerebral infarction affecting left non-dominant side: Secondary | ICD-10-CM | POA: Diagnosis not present

## 2014-12-12 DIAGNOSIS — R4189 Other symptoms and signs involving cognitive functions and awareness: Secondary | ICD-10-CM

## 2014-12-12 NOTE — Therapy (Signed)
[facility] [address] [place], [place], [address] Phone: [phone]   Fax:  [phone]  Occupational Therapy Evaluation  Patient Details  Name: Brian Hall MRN: [id number] Date of Birth: [birth year] Referring Provider:  Claretta Fraise, MD  Encounter Date: 12/12/2014      OT End of Session - 12/12/14 0943    Visit Number 1   Number of Visits 16   Date for OT Re-Evaluation 02/15/15   Authorization Type wife states pt is on [employer]   Authorization Time Period pt will not return to clinic until 12/21/14 in order to allow time to clarify insurance.   OT Start Time [id number]   OT Stop Time 0930   OT Time Calculation (min) 43 min   Activity Tolerance Patient tolerated treatment well      Past Medical History  Diagnosis Date  . Hypertension   . Diabetes mellitus   . Obesity   . CHF (congestive heart failure)   . SOB (shortness of breath)   . Hyperkalemia   . Renal insufficiency   . Cardiogenic shock   . Cardiac LV ejection fraction 10-20%   . Hyperlipemia   . Hypothyroidism   . Throat cancer 2011    s/p neck dissection, radiation, chemo    Past Surgical History  Procedure Laterality Date  . Cardiac catheterization    . Neck dissection Right 2011    s/p resection of throat cancer with multiple nodes removed    There were no vitals filed for this visit.  Visit Diagnosis:  Hemiplegia affecting left nondominant side - Plan: Ot plan of care cert/re-cert  Impaired cognition - Plan: Ot plan of care cert/re-cert  Impaired sensation - Plan: Ot plan of care cert/re-cert  Left-sided neglect - Plan: Ot plan of care cert/re-cert  Impaired functional mobility and activity tolerance - Plan: Ot plan of care cert/re-cert      Subjective Assessment - 12/12/14 0850    Subjective  I am here for therapy to work on my arm and my leg.   Patient Stated Goals Get back to doing some of the things I used to do.    Currently in Pain? Yes   Pain Score 3    Pain Location Shoulder   Pain Orientation Left   Pain Descriptors / Indicators Aching   Pain Type Chronic pain   Pain Onset More than a month ago   Pain Frequency Intermittent   Aggravating Factors  if my arm hangs and with certain movements   Pain Relieving Factors supporting LUE,    Multiple Pain Sites Yes   Pain Score 5   Pain Location Wrist   Pain Orientation Left   Pain Descriptors / Indicators Sharp   Pain Type Chronic pain   Pain Onset More than a month ago   Aggravating Factors  with passive wrist extension   Pain Relieving Factors avoid movement           [facility] OT Assessment - 12/12/14 0854    Assessment   Diagnosis R MCA CVA   Onset Date 09/12/14   Prior Therapy inpt rehab (PT, OT, ST) , HH PT/OT   Precautions   Precautions None   Restrictions   Weight Bearing Restrictions No   Balance Screen   Has the patient fallen in the past 6 months No   Home  Environment   Family/patient expects to be discharged to: Private residence   Living Arrangements Spouse/significant other  also 96 yo  stepson   Available Help at Discharge Available 24 hours/day   Type of Home House   Home Layout Two level   Bathroom Building control surveyor   Prior Function   Level of Independence Independent with basic ADLs;Independent with homemaking with ambulation   Vocation Full time employment   Vocation Requirements truck driver   Leisure enjoys riding motorcycles and fixing cars   ADL   Eating/Feeding Minimal assistance  with cutting   Grooming Modified independent  uses Optometrist Bathing Modified independent  wife assists at times but pt and wife report he can do it   Lower Body Bathing Modified independent  leans side to side to wash perianal area   Upper Body Dressing Minimal assistance   Lower Body Dressing Moderate assistance   Toilet Tranfer + 1 Total assistance  using steady to get on and off   Toileting -  Clothing Manipulation Moderate assistance   Where Assessed - Toileting Clothing Manipulationn Maximal assistance   Tub/Shower Transfer + 1 Total assistance  uses steady because bathroom is not wheelchair accessible   Transfers/Ambulation Related to ADL's 3 in1 around toilet. Bathroom is not wheelchair accessible.  Uses steady to access toilet and shower.   IADL   Shopping Completely unable to shop   Light Housekeeping Does not participate in any housekeeping tasks   Meal Prep Needs to have meals prepared and served   Medication Management Is not capable of dispensing or managing own medication   Financial Management Dependent   Mobility   Mobility Status Needs assist   Written Expression   Dominant Hand Right   Vision - History   Baseline Vision Wears glasses only for reading   Vision Assessment   Eye Alignment Impaired (comment)   Ocular Range of Motion Within Functional Limits   Tracking/Visual Pursuits --  slightly impaired but functional   Cognition   Attention Sustained;Selective;Alternating;Divided   Sustained Attention Impaired   Selective Attention Impaired   Alternating Attention Impaired   Divided Attention Impaired   Memory Impaired   Memory Impairment --  will further assess   Awareness Impaired   Problem Solving Impaired   Executive Function --  impaired   Sensation   Light Touch Impaired by gross assessment   Hot/Cold Impaired by gross assessment   Proprioception Impaired by gross assessment  absent   Coordination   Gross Motor Movements are Fluid and Coordinated No   Fine Motor Movements are Fluid and Coordinated No   9 Hole Peg Test --  unable   Box and Blocks unable   Tremors clonus in L hand   Perception   Perception Impaired   Inattention/Neglect Does not attend to left visual field   Body Scheme Left body neglect for LUE   Edema   Edema very mild in L fingers   Tone   Assessment Location Left Upper Extremity   ROM / Strength   AROM / PROM /  Strength AROM;PROM;Strength   AROM   Overall AROM  Deficits   Overall AROM Comments Pt with Brunstrumm III.  Shoulder flexion to approx 90*, abduction to 90*, elbow flexion WFL's, extension WFL's, little to no movement in hand and wrist.    PROM   Overall PROM  Within functional limits for tasks performed   Strength   Overall Strength Unable to assess  due to tone   Hand Function   Left Hand Gross Grasp Impaired   Left Hand Grip (lbs) unable to  assess due to no AROM   LUE Tone   LUE Tone Mild  pt with botox to L forearm on 12/06/2014 (clonus in L hand)                           OT Short Term Goals - 12/12/14 1742    OT SHORT TERM GOAL #1   Title Pt and wife will be mod I with HEP - 01/18/2015.  Goal dates adjusted as pt will not return to clinic prior to 12/21/14   Status New   OT SHORT TERM GOAL #2   Title Pt will require min a for LB dressing   Status New   OT SHORT TERM GOAL #3   Title Pt will demonstrate ability to use LUE as gross assist during basic self care 50% of the time.   Status New   OT SHORT TERM GOAL #4   Title Pt will need no more than min vc's to compensate for L neglect during functional mobility tasks   Status New   OT SHORT TERM GOAL #5   Title Pt will demonstrate ability to attend to familiar functional task for 15 minute with no more than one vc.   Status New   Additional Short Term Goals   Additional Short Term Goals Yes   OT SHORT TERM GOAL #6   Title Pt will require no more than min assist for toilet transfers   Status New           OT Long Term Goals - 12/12/14 1746    OT LONG TERM GOAL #1   Title Pt and wife will be mod I with upgraded HEP - 02/15/2015. Goal date adjusted as pt will not return to clinic prior to 12/21/2014   Status New   OT LONG TERM GOAL #2   Title Pt will demonstrate ability to use LUE as gross assist 100% of the time in basic self care tasks   Status New   OT LONG TERM GOAL #3   Title Pt will demonstrate  ability to hold cylindrical object in L hand.    Status New   OT LONG TERM GOAL #4   Title Pt will verbalize understanding of AE for buttoning, zipping and cutting   Status New   OT LONG TERM GOAL #5   Title Pt will be supervision for toilet transfers   Status New   Long Term Additional Goals   Additional Long Term Goals Yes   OT LONG TERM GOAL #6   Title Pt will be min a for shower transfers   Status New   OT LONG TERM GOAL #7   Title Pt will  be supervision for simple hot meal prep   Status New               Plan - 12/12/14 1308    Clinical Impression Statement Pt is a 59 year old male s/p R MCA CVA on 09/12/2014 who received inpt rehab and brief HH therapies. Pt presents today with the following impairments which impact his ability to complete ADL's and I ADL's indepdendently:  L non dominant hemplegia with mild flexor tone, decrease AROM, decreased functional strength, impaired sensation, intermiitent pain in L shoulder and L wrist, decrease functional use of LUE, L neglect, decreased standing balance, decreasd trunk control, decreased activity tolerance, impaired cognition,. Pt will benefit from skilled OT services to maximize independence in functional activity.   Rehab Potential  Good   OT Frequency 2x / week   OT Duration 8 weeks  pt to return to clinic on 12/21/14 to allow time to clarify insurance   OT Treatment/Interventions Self-care/ADL training;Cryotherapy;Moist Heat;Electrical Stimulation;Fluidtherapy;DME and/or AE instruction;Energy conservation;Neuromuscular education;Therapeutic exercise;Functional Mobility Training;Manual Therapy;Passive range of motion;Splinting;Therapeutic activities;Balance training;Patient/family education;Cognitive remediation/compensation   Plan initiate HEP   Consulted and Agree with Plan of Care Patient        Problem List Patient Active Problem List   Diagnosis Date Noted  . Spastic hemiparesis affecting nondominant side 11/08/2014   . Hypothyroidism 10/27/2014  . Left-sided neglect 09/23/2014  . Acute ischemic right MCA stroke 09/16/2014  . Left hemiparesis   . CVA (cerebral vascular accident)   . Right carotid artery occlusion   . Acute right MCA stroke   . Essential hypertension 09/12/2014  . CVA (cerebral infarction) 09/12/2014  . Type 2 diabetes mellitus with peripheral neuropathy 09/12/2014    Quay Burow, OTR/L 12/12/2014, 5:54 PM  [facility] [address] [place], [place], [address] Phone: [phone]   Fax:  [phone]

## 2014-12-14 NOTE — Therapy (Signed)
[facility] [address] [place], [place], [address] Phone: [phone]   Fax:  [phone]  Physical Therapy Evaluation  Patient Details  Name: Brian Hall MRN: [id number] Date of Birth: Nov 06, 1955 Referring Provider:  Claretta Fraise, MD  Encounter Date: 12/12/2014      PT End of Session - 12/14/14 1300    Visit Number 1   Number of Visits 17   Date for PT Re-Evaluation 02/10/15   PT Start Time 0805   PT Stop Time 0850   PT Time Calculation (min) 45 min   Equipment Utilized During Treatment Gait belt   Activity Tolerance Patient tolerated treatment well   Behavior During Therapy [facility] for tasks assessed/performed      Past Medical History  Diagnosis Date  . Hypertension   . Diabetes mellitus   . Obesity   . CHF (congestive heart failure)   . SOB (shortness of breath)   . Hyperkalemia   . Renal insufficiency   . Cardiogenic shock   . Cardiac LV ejection fraction 10-20%   . Hyperlipemia   . Hypothyroidism   . Throat cancer 2011    s/p neck dissection, radiation, chemo    Past Surgical History  Procedure Laterality Date  . Cardiac catheterization    . Neck dissection Right 2011    s/p resection of throat cancer with multiple nodes removed    There were no vitals filed for this visit.  Visit Diagnosis:  Hemiplegia affecting left nondominant side  Abnormality of gait  Impaired functional mobility and activity tolerance          [facility] PT Assessment - 12/14/14 0753    Assessment   Medical Diagnosis CVA with L hemiparesis   Onset Date 09/11/14   Precautions   Precautions Fall   Required Braces or Orthoses --  L AFO   Home Environment   Living Enviornment Private residence   Living Arrangements Spouse/significant other   Available Help at Discharge Family   Type of [name] One level   Home Equipment Wheelchair - manual;Tub bench   Lumex stand assist-shower and toilet; hemiwalker   Prior Function   Level of Independence Independent with basic ADLs;Independent with homemaking with ambulation;Independent with gait;Independent with transfers   Vocation Full time employment  truck driver   Vocation Requirements haul heavy equipment   Leisure Enjoys motorcycle riding, working on cars, Therapist, sports Impaired by gross assessment  to light touch LLE   Posture/Postural Control   Posture/Postural Control Postural limitations   Postural Limitations Weight shift right;Posterior pelvic tilt  holds L ankle in plantar flexion and inversion   Posture Comments associated reflexive movements on LUE and LLE extension noted with MMT R side   PROM   Left Ankle Dorsiflexion --  p/rom to neutral dorsiflexion; holds in inversion   Strength   Right Hip Flexion 5/5   Left Hip Flexion 3+/5   Right Knee Flexion 5/5   Right Knee Extension 5/5   Left Knee Flexion 3/5   Left Knee Extension 3+/5  associated movements of LUE and post trunk lean   Right Ankle Dorsiflexion 0/5   Transfers   Transfers Sit to Stand;Stand to Sit   Sit to Stand 5: Supervision   Stand to Sit 5: Supervision   Ambulation/Gait   Ambulation/Gait Yes   Ambulation/Gait Assistance 4: Min guard   Ambulation Distance (Feet)  40 Feet   Assistive device Hemi-walker   Gait Pattern Step-to pattern;Decreased step length - left;Decreased stance time - left;Decreased hip/knee flexion - left;Decreased dorsiflexion - left;Decreased weight shift to left;Lateral trunk lean to left;Decreased trunk rotation;Narrow base of support   Ambulation Surface Level;Indoor   Gait velocity 43.88 sec=0.75 ft/sec   Standardized Balance Assessment   Standardized Balance Assessment Timed Up and Go Test   Timed Up and Go Test   TUG Normal TUG   Normal TUG (seconds) 46.78   LLE Tone   LLE Tone Moderate                             PT Short Term  Goals - 12/14/14 1306    PT SHORT TERM GOAL #1   Title Pt will perform HEP with supervision of family, for improved strength and balance. (Target 01/10/15)   Time 4   Period Weeks   Status New   PT SHORT TERM GOAL #2   Title Pt will improve Timed UP and Go score to less than or equal to 35 seconds for decreased fall risk.   Baseline eval-46.78 sec   Time 4   Period Weeks   Status New   PT SHORT TERM GOAL #3   Title Berg Balance score to be assessed, with pt scoring at least 5 additional points to decrease fall risk.   Time 4   Period Weeks   Status New   PT SHORT TERM GOAL #4   Title Pt will perform at least 6 of 10 reps of sit<>stand transfers no UE support, with equal weightbearing through bilateral lower extremities, independently.   Time 4   Period Weeks   Status New           PT Long Term Goals - 12/14/14 1310    PT LONG TERM GOAL #1   Title Pt will verbalize understanding of fall prevention within home environment. Target 02/10/15   Time 8   Period Weeks   Status New   PT LONG TERM GOAL #2   Title Pt will improve TUG to less than or equal to 25 seconds for decreased fall risk.   Time 8   Period Weeks   Status New   PT LONG TERM GOAL #3   Title Pt will improve gait velocity to at least 1 ft/sec for improved gait efficiency and safety.   Time 8   Period Weeks   Status New   PT LONG TERM GOAL #4   Title Pt will ambulate at least 50-100 ft using hemiwalker, household distances, with supervision.   Time 8   Status New               Plan - 12/14/14 1301    Clinical Impression Statement Pt is  59 year old male who presents to OP PT with history of CVA L hemiparesis on 09/12/14.  Pt presents with L sided weakness, increased tone on LLE, decreased timing and coordination of gait, decreased balance, and decreased safety awareness with gait.  Pt is at fall risk per Timed Up and Go score of 46.78 seconds, and gait velocity score of 0.75 ft/sec.  Pt would benefit from  skilled PT to address strength, neuro re-education, balance and gait training for improved functional mobility and decreased fall risk.   Pt will benefit from skilled therapeutic intervention in order to improve on the following deficits Abnormal gait;Decreased activity tolerance;Decreased balance;Decreased mobility;Decreased range  of motion;Decreased safety awareness;Difficulty walking;Decreased strength;Impaired tone   Rehab Potential Good   PT Frequency 2x / week   PT Duration 8 weeks  plus evaluation   PT Treatment/Interventions ADLs/Self Care Home Management;Therapeutic activities;Functional mobility training;Stair training;Gait training;Therapeutic exercise;Balance training;Neuromuscular re-education;Patient/family education   PT Next Visit Plan Complete BERG; Initiate HEP for ankle stretching, weight bearing activities through bilateral lower extremities, LLE strength and neuro-reeducation   Consulted and Agree with Plan of Care Patient;Family member/caregiver   Family Member Consulted wife         Problem List Patient Active Problem List   Diagnosis Date Noted  . Spastic hemiparesis affecting nondominant side 11/08/2014  . Hypothyroidism 10/27/2014  . Left-sided neglect 09/23/2014  . Acute ischemic right MCA stroke 09/16/2014  . Left hemiparesis   . CVA (cerebral vascular accident)   . Right carotid artery occlusion   . Acute right MCA stroke   . Essential hypertension 09/12/2014  . CVA (cerebral infarction) 09/12/2014  . Type 2 diabetes mellitus with peripheral neuropathy 09/12/2014    Brian Strandberg W. 12/14/2014, 1:13 PM  Brian Hall, PT 12/14/2014 1:13 PM Phone: [phone] Fax: [place] [address] [place], [place], [address] Phone: [phone]   Fax:  [phone]

## 2014-12-19 ENCOUNTER — Encounter: Payer: Self-pay | Admitting: Family Medicine

## 2014-12-22 ENCOUNTER — Other Ambulatory Visit: Payer: Self-pay

## 2014-12-22 MED ORDER — LINAGLIPTIN 5 MG PO TABS: 5.0000 mg | ORAL_TABLET | Freq: Every day | ORAL | Status: DC

## 2014-12-22 NOTE — Telephone Encounter (Signed)
x

## 2014-12-27 ENCOUNTER — Ambulatory Visit: Payer: 59 | Admitting: Occupational Therapy

## 2014-12-27 ENCOUNTER — Ambulatory Visit: Payer: 59 | Admitting: Physical Therapy

## 2014-12-27 DIAGNOSIS — R414 Neurologic neglect syndrome: Secondary | ICD-10-CM

## 2014-12-27 DIAGNOSIS — Z7409 Other reduced mobility: Secondary | ICD-10-CM

## 2014-12-27 DIAGNOSIS — G8194 Hemiplegia, unspecified affecting left nondominant side: Secondary | ICD-10-CM

## 2014-12-27 DIAGNOSIS — I6931 Cognitive deficits following cerebral infarction: Secondary | ICD-10-CM | POA: Diagnosis not present

## 2014-12-27 DIAGNOSIS — R4189 Other symptoms and signs involving cognitive functions and awareness: Secondary | ICD-10-CM

## 2014-12-27 DIAGNOSIS — R269 Unspecified abnormalities of gait and mobility: Secondary | ICD-10-CM

## 2014-12-27 NOTE — Patient Instructions (Signed)
Dorsiflexion: Stretch - Heel Cord / Gastrocnemius   Position (A) Patient: Lie or sit with knee straight. Helper: Cup left heel. Make sure grip is firm. Motion (B) - Helper uses forearm to apply pressure to entire sole of foot, stretching foot toward shin. CAUTION: Stretch should be felt in calf. Do not allow foot to twist. Hold 60 seconds. Repeat 4 times. Repeat with other leg. Do 3 sessions per day.   Copyright  VHI. All rights reserved.  Gastroc / Heel Cord Stretch - Seated With Towel   Sit on floor, towel around ball of foot. Gently pull foot in toward body, stretching heel cord and calf. Hold for 60 seconds.  Repeat 3 times. Do 3 times per day.  Copyright  VHI. All rights reserved.

## 2014-12-27 NOTE — Therapy (Signed)
[place] [address] [place], [place], [address] Phone: [phone]   Fax:  [phone]  Physical Therapy Treatment  Patient Details  Name: Brian Hall MRN: [id number] Date of Birth: 07/13/1956 Referring Provider:  Claretta Fraise, MD  Encounter Date: 12/27/2014      PT End of Session - 12/27/14 1225    Visit Number 2   Number of Visits 17   Date for PT Re-Evaluation 02/10/15   PT Start Time 0933   PT Stop Time 1015   PT Time Calculation (min) 42 min   Activity Tolerance Patient tolerated treatment well   Behavior During Therapy [facility] for tasks assessed/performed      Past Medical History  Diagnosis Date  . Hypertension   . Diabetes mellitus   . Obesity   . CHF (congestive heart failure)   . SOB (shortness of breath)   . Hyperkalemia   . Renal insufficiency   . Cardiogenic shock   . Cardiac LV ejection fraction 10-20%   . Hyperlipemia   . Hypothyroidism   . Throat cancer 2011    s/p neck dissection, radiation, chemo    Past Surgical History  Procedure Laterality Date  . Cardiac catheterization    . Neck dissection Right 2011    s/p resection of throat cancer with multiple nodes removed    There were no vitals filed for this visit.  Visit Diagnosis:  Abnormality of gait  Hemiplegia affecting left nondominant side      Subjective Assessment - 12/27/14 0937    Subjective Pt denies falls or changes since last visit.   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  He has a motorcycle and would like to ride it again.   Currently in Pain? Yes   Pain Score 1    Pain Location Shoulder   Pain Orientation Left   Pain Descriptors / Indicators Aching   Pain Type Chronic pain   Pain Onset More than a month ago   Pain Frequency Intermittent   Aggravating Factors  if  arm hangs a certain way   Pain Relieving Factors support UE                         [facility] Adult PT Treatment/Exercise - 12/27/14 0942    Berg Balance Test   Sit to Stand Able to stand  independently using hands   Standing Unsupported Able to stand 2 minutes with supervision   Sitting with Back Unsupported but Feet Supported on Floor or Stool Able to sit 2 minutes under supervision   Stand to Sit Sits safely with minimal use of hands   Transfers Needs one person to assist   Standing Unsupported with Eyes Closed Able to stand 10 seconds with supervision   Standing Ubsupported with Feet Together Needs help to attain position and unable to hold for 15 seconds   From Standing, Reach Forward with Outstretched Arm Reaches forward but needs supervision   From Standing Position, Pick up Object from Floor Unable to try/needs assist to keep balance   From Standing Position, Turn to Look Behind Over each Shoulder Turn sideways only but maintains balance   Turn 360 Degrees Needs assistance while turning   Standing Unsupported, Alternately Place Feet on Step/Stool Needs assistance to keep from falling or  unable to try   Standing Unsupported, One Foot in [place] help to step but can hold 15 seconds   Standing on One Leg Unable to try or needs assist to prevent fall   Total Score 21   Ankle Exercises: Stretches   Other Stretch PROM heel cord stretch to L ankle x 30 sec x 2;instructed wife in safe   Other Stretch heel cord stretch L ankle with sheet by patient x 30 sec x 2;cues for ankle alignment                PT Education - 12/27/14 1224    Education provided Yes   Education Details HEP   Person(s) Educated Patient;Spouse   Methods Explanation;Demonstration;Handout   Comprehension Verbalized understanding          PT Short Term Goals - 12/14/14 1306    PT SHORT TERM GOAL #1   Title Pt will perform HEP with supervision of family, for improved strength and  balance. (Target 01/10/15)   Time 4   Period Weeks   Status New   PT SHORT TERM GOAL #2   Title Pt will improve Timed UP and Go score to less than or equal to 35 seconds for decreased fall risk.   Baseline eval-46.78 sec   Time 4   Period Weeks   Status New   PT SHORT TERM GOAL #3   Title Berg Balance score to be assessed, with pt scoring at least 5 additional points to decrease fall risk.   Time 4   Period Weeks   Status New   PT SHORT TERM GOAL #4   Title Pt will perform at least 6 of 10 reps of sit<>stand transfers no UE support, with equal weightbearing through bilateral lower extremities, independently.   Time 4   Period Weeks   Status New           PT Long Term Goals - 12/14/14 1310    PT LONG TERM GOAL #1   Title Pt will verbalize understanding of fall prevention within home environment. Target 02/10/15   Time 8   Period Weeks   Status New   PT LONG TERM GOAL #2   Title Pt will improve TUG to less than or equal to 25 seconds for decreased fall risk.   Time 8   Period Weeks   Status New   PT LONG TERM GOAL #3   Title Pt will improve gait velocity to at least 1 ft/sec for improved gait efficiency and safety.   Time 8   Period Weeks   Status New   PT LONG TERM GOAL #4   Title Pt will ambulate at least 50-100 ft using hemiwalker, household distances, with supervision.   Time 8   Status New               Plan - 12/27/14 1240    Clinical Impression Statement Pt scored 21/56 on BERG which puts pt in high fall risk category.  Pt motivated to improved mobility.  Wife active with patients care and exercises at home.  Continue PT per POC.   Pt will benefit from skilled therapeutic intervention in order to improve on the following deficits Abnormal gait;Decreased activity tolerance;Decreased balance;Decreased mobility;Decreased range of motion;Decreased safety awareness;Difficulty walking;Decreased strength;Impaired tone   Rehab Potential Good   PT Frequency 2x /  week   PT Duration 8 weeks   PT Treatment/Interventions ADLs/Self Care Home Management;Therapeutic activities;Functional mobility training;Stair training;Gait training;Therapeutic exercise;Balance training;Neuromuscular re-education;Patient/family  education   PT Next Visit Plan Review ankle stretches, weight bearing through LE's, review pts current HEP if pt brings in their copy   Consulted and Agree with Plan of Care Patient;Family member/caregiver   Family Member Consulted wife        Problem List Patient Active Problem List   Diagnosis Date Noted  . Spastic hemiparesis affecting nondominant side 11/08/2014  . Hypothyroidism 10/27/2014  . Left-sided neglect 09/23/2014  . Acute ischemic right MCA stroke 09/16/2014  . Left hemiparesis   . CVA (cerebral vascular accident)   . Right carotid artery occlusion   . Acute right MCA stroke   . Essential hypertension 09/12/2014  . CVA (cerebral infarction) 09/12/2014  . Type 2 diabetes mellitus with peripheral neuropathy 09/12/2014    Narda Bonds 12/27/2014, 12:44 PM  [place] [address] [place], [place], [address] Phone: [phone]   Fax:  [place] 12/27/2014 12:44 PM Phone: [phone] Fax: [phone]

## 2014-12-27 NOTE — Patient Instructions (Signed)
While laying on your back gently clasp wrist of left hand, raise both arms overhead towards ceiling, 10-20 reps 1-2x day  While laying on your back gently clasp wrist of left arm , raise arms to approximately 90-100 overhead with elbows straight, 10-20 reps 1-2x day  Make sure not to hold your breath, count if you need to!!  Seated edge of bed lean over to the side on left elbow(position on a pillow if you need to) raise right arm up, make sure feet are on the ground. 5 reps hold 10-20 secs.  Gently assist left arm , turn pam up/ palm down, 20 reps 1x day

## 2014-12-27 NOTE — Therapy (Signed)
[place] [address] [place], [place], [address] Phone: [phone]   Fax:  [phone]  Occupational Therapy Treatment  Patient Details  Name: Brian Hall MRN: [id number] Date of Birth: April 19, 1956 Referring Provider:  Claretta Fraise, MD  Encounter Date: 12/27/2014      OT End of Session - 12/27/14 1048    Visit Number 2   Number of Visits 16   Date for OT Re-Evaluation 02/15/15   Authorization Type wife states pt is on cobra   OT Start Time 1020   OT Stop Time 1100   OT Time Calculation (min) 40 min   Activity Tolerance Patient tolerated treatment well   Behavior During Therapy [facility] for tasks assessed/performed      Past Medical History  Diagnosis Date  . Hypertension   . Diabetes mellitus   . Obesity   . CHF (congestive heart failure)   . SOB (shortness of breath)   . Hyperkalemia   . Renal insufficiency   . Cardiogenic shock   . Cardiac LV ejection fraction 10-20%   . Hyperlipemia   . Hypothyroidism   . Throat cancer 2011    s/p neck dissection, radiation, chemo    Past Surgical History  Procedure Laterality Date  . Cardiac catheterization    . Neck dissection Right 2011    s/p resection of throat cancer with multiple nodes removed    There were no vitals filed for this visit.  Visit Diagnosis:  Hemiplegia affecting left nondominant side  Impaired functional mobility and activity tolerance  Impaired cognition  Left-sided neglect         Treatment: Pt/ wife were instructed in initial HEP.Pt returned demonstration and pt's wife verbalized understanding.(see pt instructions) Pt requires frequent v.c. to slow down and to avoid holding his breath.  Supine on mat pt performed shoulder flexion with medium ball, min facillitation. Triceps extension in supine with therapist facillitating LUE.                     OT Education - 12/27/14 1337    Education provided Yes    Education Details HEP   Person(s) Educated Patient;Spouse   Methods Explanation;Demonstration;Handout   Comprehension Verbalized understanding;Returned demonstration          OT Short Term Goals - 12/12/14 1742    OT SHORT TERM GOAL #1   Title Pt and wife will be mod I with HEP - 01/18/2015.  Goal dates adjusted as pt will not return to clinic prior to 12/21/14   Status New   OT SHORT TERM GOAL #2   Title Pt will require min a for LB dressing   Status New   OT SHORT TERM GOAL #3   Title Pt will demonstrate ability to use LUE as gross assist during basic self care 50% of the time.   Status New   OT SHORT TERM GOAL #4   Title Pt will need no more than min vc's to compensate for L neglect during functional mobility tasks   Status New   OT SHORT TERM GOAL #5   Title Pt will demonstrate ability to attend to familiar functional task for 15 minute with no more than one vc.   Status New   Additional Short Term Goals   Additional Short Term Goals Yes   OT SHORT TERM GOAL #6   Title Pt will require no more than min assist for toilet transfers   Status New  OT Long Term Goals - 12/12/14 1746    OT LONG TERM GOAL #1   Title Pt and wife will be mod I with upgraded HEP - 02/15/2015. Goal date adjusted as pt will not return to clinic prior to 12/21/2014   Status New   OT LONG TERM GOAL #2   Title Pt will demonstrate ability to use LUE as gross assist 100% of the time in basic self care tasks   Status New   OT LONG TERM GOAL #3   Title Pt will demonstrate ability to hold cylindrical object in L hand.    Status New   OT LONG TERM GOAL #4   Title Pt will verbalize understanding of AE for buttoning, zipping and cutting   Status New   OT LONG TERM GOAL #5   Title Pt will be supervision for toilet transfers   Status New   Long Term Additional Goals   Additional Long Term Goals Yes   OT LONG TERM GOAL #6   Title Pt will be min a for shower transfers   Status New   OT LONG  TERM GOAL #7   Title Pt will  be supervision for simple hot meal prep   Status New               Plan - 12/27/14 1337    Clinical Impression Statement Pt is progressing towards goals. Pt/ wife verbalize understanding of inital HEP.   OT Frequency 2x / week   OT Duration 8 weeks   Plan reinforce and progress HEP, neuro re-ed   [place] issued inital HEP-12/27/14   Consulted and Agree with Plan of Care Patient;Family member/caregiver        Problem List Patient Active Problem List   Diagnosis Date Noted  . Spastic hemiparesis affecting nondominant side 11/08/2014  . Hypothyroidism 10/27/2014  . Left-sided neglect 09/23/2014  . Acute ischemic right MCA stroke 09/16/2014  . Left hemiparesis   . CVA (cerebral vascular accident)   . Right carotid artery occlusion   . Acute right MCA stroke   . Essential hypertension 09/12/2014  . CVA (cerebral infarction) 09/12/2014  . Type 2 diabetes mellitus with peripheral neuropathy 09/12/2014    [place] 12/27/2014, 1:40 PM Theone Murdoch, OTR/L [phone] Phone: [phone] 1:40 PM 12/27/2014 [place] [address] [place], [place], [address] Phone: [phone]   Fax:  [phone]

## 2014-12-29 ENCOUNTER — Ambulatory Visit: Payer: 59 | Admitting: Occupational Therapy

## 2014-12-29 ENCOUNTER — Ambulatory Visit: Payer: 59 | Admitting: Physical Therapy

## 2014-12-29 DIAGNOSIS — G8194 Hemiplegia, unspecified affecting left nondominant side: Secondary | ICD-10-CM

## 2014-12-29 DIAGNOSIS — R269 Unspecified abnormalities of gait and mobility: Secondary | ICD-10-CM

## 2014-12-29 DIAGNOSIS — Z7409 Other reduced mobility: Secondary | ICD-10-CM

## 2014-12-29 DIAGNOSIS — R414 Neurologic neglect syndrome: Secondary | ICD-10-CM

## 2014-12-29 DIAGNOSIS — I6931 Cognitive deficits following cerebral infarction: Secondary | ICD-10-CM | POA: Diagnosis not present

## 2014-12-29 NOTE — Therapy (Signed)
[facility] [address] [place], [place], [address] Phone: [phone]   Fax:  [phone]  Occupational Therapy Treatment  Patient Details  Name: Brian Hall MRN: [id number] Date of Birth: 1956/03/31 Referring Provider:  Claretta Fraise, MD  Encounter Date: 12/29/2014      OT End of Session - 12/29/14 1254    Visit Number 3   Number of Visits 16   Date for OT Re-Evaluation 02/15/15   Authorization Type wife states pt is on [employer]   OT Start Time 0848   OT Stop Time 0935   OT Time Calculation (min) 47 min   Activity Tolerance Patient tolerated treatment well   Behavior During Therapy [facility] for tasks assessed/performed      Past Medical History  Diagnosis Date  . Hypertension   . Diabetes mellitus   . Obesity   . CHF (congestive heart failure)   . SOB (shortness of breath)   . Hyperkalemia   . Renal insufficiency   . Cardiogenic shock   . Cardiac LV ejection fraction 10-20%   . Hyperlipemia   . Hypothyroidism   . Throat cancer 2011    s/p neck dissection, radiation, chemo    Past Surgical History  Procedure Laterality Date  . Cardiac catheterization    . Neck dissection Right 2011    s/p resection of throat cancer with multiple nodes removed    There were no vitals filed for this visit.  Visit Diagnosis:  Impaired functional mobility and activity tolerance  Left-sided neglect  Hemiplegia affecting left nondominant side      Subjective Assessment - 12/29/14 0857    Subjective  Pt reports he wants get back to working on cars   Patient Stated Goals Get back to doing some of the things I used to do.   Currently in Pain? Yes   Pain Score 3    Pain Location Arm   Pain Orientation Left   Pain Descriptors / Indicators Aching   Pain Type Chronic pain   Pain Onset More than a month ago   Aggravating Factors  malpositioning   Pain Relieving Factors reposistioing   Multiple Pain Sites No        Treatment: Pt was fitted with a resting splint for improved positioning. Pt and wife were educated in splint wear, care and precautions. Therapist recommends pt start wearing splint for several hours during daytime, and increased time gradually. If no problems after approximately 1 week transition to night time wear.                        OT Education - 12/29/14 1258    Education provided Yes   Education Details splint wear, care and precautions   Person(s) Educated Patient;Spouse   Methods Explanation;Demonstration;Handout   Comprehension Verbalized understanding;Returned demonstration          OT Short Term Goals - 12/12/14 1742    OT SHORT TERM GOAL #1   Title Pt and wife will be mod I with HEP - 01/18/2015.  Goal dates adjusted as pt will not return to clinic prior to 12/21/14   Status New   OT SHORT TERM GOAL #2   Title Pt will require min a for LB dressing   Status New   OT SHORT TERM GOAL #3   Title Pt will demonstrate ability to use LUE as gross assist during basic self care 50% of the time.   Status New  OT SHORT TERM GOAL #4   Title Pt will need no more than min vc's to compensate for L neglect during functional mobility tasks   Status New   OT SHORT TERM GOAL #5   Title Pt will demonstrate ability to attend to familiar functional task for 15 minute with no more than one vc.   Status New   Additional Short Term Goals   Additional Short Term Goals Yes   OT SHORT TERM GOAL #6   Title Pt will require no more than min assist for toilet transfers   Status New           OT Long Term Goals - 12/12/14 1746    OT LONG TERM GOAL #1   Title Pt and wife will be mod I with upgraded HEP - 02/15/2015. Goal date adjusted as pt will not return to clinic prior to 12/21/2014   Status New   OT LONG TERM GOAL #2   Title Pt will demonstrate ability to use LUE as gross assist 100% of the time in basic self care tasks   Status New   OT LONG TERM GOAL #3    Title Pt will demonstrate ability to hold cylindrical object in L hand.    Status New   OT LONG TERM GOAL #4   Title Pt will verbalize understanding of AE for buttoning, zipping and cutting   Status New   OT LONG TERM GOAL #5   Title Pt will be supervision for toilet transfers   Status New   Long Term Additional Goals   Additional Long Term Goals Yes   OT LONG TERM GOAL #6   Title Pt will be min a for shower transfers   Status New   OT LONG TERM GOAL #7   Title Pt will  be supervision for simple hot meal prep   Status New               Plan - 12/29/14 1253    Clinical Impression Statement Pt is progressing towards goals. He reports increased wrist comfort following application of resting hand splint.   Plan splint check, reinforce and progress HEP.   OT Home Exercise Plan issued inital HEP-12/27/14   Consulted and Agree with Plan of Care Patient;Family member/caregiver        Problem List Patient Active Problem List   Diagnosis Date Noted  . Spastic hemiparesis affecting nondominant side 11/08/2014  . Hypothyroidism 10/27/2014  . Left-sided neglect 09/23/2014  . Acute ischemic right MCA stroke 09/16/2014  . Left hemiparesis   . CVA (cerebral vascular accident)   . Right carotid artery occlusion   . Acute right MCA stroke   . Essential hypertension 09/12/2014  . CVA (cerebral infarction) 09/12/2014  . Type 2 diabetes mellitus with peripheral neuropathy 09/12/2014    Brian Hall 12/29/2014, 12:58 PM Brian Hall, OTR/L [phone] Phone: [phone] 12:58 PM 12/29/2014 [facility] [address] [place], [place], [address] Phone: [phone]   Fax:  [phone]

## 2014-12-29 NOTE — Therapy (Signed)
[facility] [address] [place], [place], [address] Phone: [phone]   Fax:  [phone]  Physical Therapy Treatment  Patient Details  Name: Brian Hall MRN: [id number] Date of Birth: 30-Oct-1955 Referring Provider:  Claretta Fraise, MD  Encounter Date: 12/29/2014      PT End of Session - 12/29/14 1624    Visit Number 3   Number of Visits 17   Date for PT Re-Evaluation 02/10/15   PT Start Time 0804   PT Stop Time 0847   PT Time Calculation (min) 43 min   Equipment Utilized During Treatment Gait belt   Activity Tolerance Patient tolerated treatment well   Behavior During Therapy [facility] for tasks assessed/performed      Past Medical History  Diagnosis Date  . Hypertension   . Diabetes mellitus   . Obesity   . CHF (congestive heart failure)   . SOB (shortness of breath)   . Hyperkalemia   . Renal insufficiency   . Cardiogenic shock   . Cardiac LV ejection fraction 10-20%   . Hyperlipemia   . Hypothyroidism   . Throat cancer 2011    s/p neck dissection, radiation, chemo    Past Surgical History  Procedure Laterality Date  . Cardiac catheterization    . Neck dissection Right 2011    s/p resection of throat cancer with multiple nodes removed    There were no vitals filed for this visit.  Visit Diagnosis:  Hemiplegia affecting left nondominant side  Impaired functional mobility and activity tolerance  Abnormality of gait      Subjective Assessment - 12/29/14 0807    Subjective Pt has had no changes since last visit.   Currently in Pain? Yes   Pain Score 2    Pain Location Wrist  ankle   Pain Orientation Left   Pain Descriptors / Indicators Aching   Pain Type Chronic pain   Pain Onset More than a month ago   Pain Frequency Intermittent   Aggravating Factors  arm positioned a certain way   Pain Relieving Factors support of UE  OT is addressing        Pt brought in current HEP and  pt performs exercises from previous home HEP:  Seated LAQ, seated march, seated hip resisted abduction, seated hamstring curls (added red band for resistance).  Sit<>stand from elevated sitting surface x 5 reps, then minisquats, hip kicks and marching at counter.  Provided cues to patient and wife to slow pace of exercises and to use best posture/abdominal activation to prevent excessive trunk movements and compensations.  Neuro Re-education: -In parallel bars-weightbearing through LLE as stance leg with R step taps 2 sets x 10 reps at 4" block -Weightbearing through LLE as stance leg with R foot propped on 4" block with decreased UE support through R UE, with minimal to mod assist and tactile cues for upright standing. -With R foot propped on 4" block, pt performs L knee flexion and extension with min-mod assistance for stability. -Sidestepping in parallel bars with cues for increased LLE foot clearance.  Gait: -Gait activities in parallel bars:  Forward/back gait 4 reps x 10 ft each direction, with cues for increased weightshifting and increased stance time on LLE.  Gait training x 15 ft using hemiwalker, then x 40 ft. Then 80 ft. Using large base quad cane.  With large base quad cane, pt noted to take increased step on RLE, allowing for initial hip and knee flexion at  beginning of swing phase on LLE.                         PT Education - 12/29/14 1624    Education provided Yes   Education Details Slowed pace, controlled performance of HEP with good posture   Person(s) Educated Patient;Spouse   Methods Explanation;Demonstration   Comprehension Verbalized understanding;Returned demonstration          PT Short Term Goals - 12/14/14 1306    PT SHORT TERM GOAL #1   Title Pt will perform HEP with supervision of family, for improved strength and balance. (Target 01/10/15)   Time 4   Period Weeks   Status New   PT SHORT TERM GOAL #2   Title Pt will improve Timed UP and Go  score to less than or equal to 35 seconds for decreased fall risk.   Baseline eval-46.78 sec   Time 4   Period Weeks   Status New   PT SHORT TERM GOAL #3   Title Berg Balance score to be assessed, with pt scoring at least 5 additional points to decrease fall risk.   Time 4   Period Weeks   Status New   PT SHORT TERM GOAL #4   Title Pt will perform at least 6 of 10 reps of sit<>stand transfers no UE support, with equal weightbearing through bilateral lower extremities, independently.   Time 4   Period Weeks   Status New           PT Long Term Goals - 12/14/14 1310    PT LONG TERM GOAL #1   Title Pt will verbalize understanding of fall prevention within home environment. Target 02/10/15   Time 8   Period Weeks   Status New   PT LONG TERM GOAL #2   Title Pt will improve TUG to less than or equal to 25 seconds for decreased fall risk.   Time 8   Period Weeks   Status New   PT LONG TERM GOAL #3   Title Pt will improve gait velocity to at least 1 ft/sec for improved gait efficiency and safety.   Time 8   Period Weeks   Status New   PT LONG TERM GOAL #4   Title Pt will ambulate at least 50-100 ft using hemiwalker, household distances, with supervision.   Time 8   Status New               Plan - 12/29/14 1625    Clinical Impression Statement Pt requires verbal and tactile cues with current HEP to allow for optimal posture and muscular control.  Pt responds well to use of large base quad cane, with improved initiation of LLE with hip and knee flexion to begin swing phase.   Pt will benefit from skilled therapeutic intervention in order to improve on the following deficits Abnormal gait;Decreased activity tolerance;Decreased balance;Decreased mobility;Decreased range of motion;Decreased safety awareness;Difficulty walking;Decreased strength;Impaired tone   Rehab Potential Good   PT Frequency 2x / week   PT Duration 8 weeks  wk 1 of 8   PT Treatment/Interventions  ADLs/Self Care Home Management;Therapeutic activities;Functional mobility training;Stair training;Gait training;Therapeutic exercise;Balance training;Neuromuscular re-education;Patient/family education   PT Next Visit Plan continue LLE and equal weightbearing, posture and abdominal activation; gait training with large base quad cane   Consulted and Agree with Plan of Care Patient;Family member/caregiver   Family Member Consulted wife        Problem  List Patient Active Problem List   Diagnosis Date Noted  . Spastic hemiparesis affecting nondominant side 11/08/2014  . Hypothyroidism 10/27/2014  . Left-sided neglect 09/23/2014  . Acute ischemic right MCA stroke 09/16/2014  . Left hemiparesis   . CVA (cerebral vascular accident)   . Right carotid artery occlusion   . Acute right MCA stroke   . Essential hypertension 09/12/2014  . CVA (cerebral infarction) 09/12/2014  . Type 2 diabetes mellitus with peripheral neuropathy 09/12/2014    Loys Shugars W. 12/29/2014, 4:28 PM  Frazier Butt., PT  [facility] [address] [place], [place], [address] Phone: [phone]   Fax:  [phone]

## 2014-12-29 NOTE — Patient Instructions (Signed)
WEARING SCHEDULE:  Wear splint 1-2 hrs at a time, monitor skin closely when removing check for pressure areas and redness If no problems gradually increase wear time, if problems remove splint and bring to therapy   PURPOSE:  Improved position CARE OF SPLINT:  Keep splint away from heat sources including: stove, radiator or furnace, or a car in sunlight. The splint can melt and will no longer fit you properly  Keep away from pets and children  Clean the splint with rubbing alcohol 1-2 times per day.  * During this time, make sure you also clean your hand/arm as instructed by your therapist and/or perform dressing changes as needed. Then dry hand/arm completely before replacing splint. PRECAUTIONS/POTENTIAL PROBLEMS: *If you notice or experience increased pain, swelling, numbness, or a lingering reddened area from the splint: Contact your therapist immediately by calling [phone]. You must wear the splint for protection, but we will get you scheduled for adjustments as quickly as possible.  (If only straps or hooks need to be replaced and NO adjustments to the splint need to be made, just call the office ahead and let them know you are coming in)  If you have any medical concerns or signs of infection, please call your doctor immediately

## 2015-01-02 ENCOUNTER — Encounter: Payer: Self-pay | Admitting: Physical Medicine & Rehabilitation

## 2015-01-02 ENCOUNTER — Ambulatory Visit (HOSPITAL_BASED_OUTPATIENT_CLINIC_OR_DEPARTMENT_OTHER): Payer: 59 | Admitting: Physical Medicine & Rehabilitation

## 2015-01-02 ENCOUNTER — Encounter: Payer: 59 | Attending: Physical Medicine & Rehabilitation

## 2015-01-02 VITALS — BP 142/65 | HR 55 | Resp 14

## 2015-01-02 DIAGNOSIS — I63511 Cerebral infarction due to unspecified occlusion or stenosis of right middle cerebral artery: Secondary | ICD-10-CM | POA: Diagnosis present

## 2015-01-02 DIAGNOSIS — G8110 Spastic hemiplegia affecting unspecified side: Secondary | ICD-10-CM | POA: Diagnosis not present

## 2015-01-02 NOTE — Progress Notes (Signed)
Subjective:    Patient ID: Brian Hall, male    DOB: [birth year], 59 y.o.   MRN: [id number] 59 year old right-handed male who lives with his wife independent prior to admission.  He presented on September 12, 2014, with left-sided weakness and slurred speech.  MRI of the brain showed acute large territory right MCA territory infarct involving right frontal lobe as well as remote infarcts medial left occipital lobe and left cerebellar hemisphere.  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  Echocardiogram with ejection fraction of 35%.  No source of embolism. DATE OF ADMISSION:  09/16/2014 DATE OF DISCHARGE:  10/14/2014  HPI Completed home health PT and OT and speech Currently receiving outpatient PT OT, patient is somewhat confused about therapy. He has been trying to strengthen his flexors the whole time and this has been in a synergistic pattern, his current therapies are concentrating on moving out of synergy pattern  Patient is concerned he feels like he cannot raise his arm up as well anymore wonders whether the Botox injection last visit could've influenced that We reviewed the injection procedure as well as the muscles injected, these were all in the forearm flexors and finger flexors nothing in the pectoralis or biceps area  Pain Inventory Average Pain 5 Pain Right Now 1 My pain is intermittent, burning and aching  In the last 24 hours, has pain interfered with the following? General activity 4 Relation with others 4 Enjoyment of life 4 What TIME of day is your pain at its worst? evening Sleep (in general) Fair  Pain is worse with: some activites Pain improves with: medication Relief from Meds: 5  Mobility walk with assistance use a walker how many minutes can you walk? 2-3 ability to climb steps?  no do you drive?  no use a wheelchair needs help with transfers Do you have any goals in this area?  yes  Function disabled: date disabled  09/11/2014 I need assistance with the following:  dressing, bathing, toileting, meal prep, household duties and shopping Do you have any goals in this area?  yes  Neuro/Psych weakness trouble walking  Prior Studies Any changes since last visit?  no  Physicians involved in your care Any changes since last visit?  no   Family History  Problem Relation Age of Onset  . Hypertension Mother   . Hypertension Father    History   Social History  . Marital Status: Legally Separated    Spouse Name: N/A  . Number of Children: N/A  . Years of Education: N/A   Occupational History  . truck driver    Social History Main Topics  . Smoking status: Never Smoker   . Smokeless tobacco: Not on file  . Alcohol Use: No  . Drug Use: No  . Sexual Activity: Not on file   Other Topics Concern  . None   Social History Narrative   Past Surgical History  Procedure Laterality Date  . Cardiac catheterization    . Neck dissection Right 2011    s/p resection of throat cancer with multiple nodes removed   Past Medical History  Diagnosis Date  . Hypertension   . Diabetes mellitus   . Obesity   . CHF (congestive heart failure)   . SOB (shortness of breath)   . Hyperkalemia   . Renal insufficiency   . Cardiogenic shock   . Cardiac LV ejection fraction 10-20%   . Hyperlipemia   . Hypothyroidism   . Throat  cancer 2011    s/p neck dissection, radiation, chemo   BP 142/65 mmHg  Pulse 55  Resp 14  SpO2 97%  Opioid Risk Score:   Fall Risk Score: Low Fall Risk (0-5 points) (patient previously educated)`1  Depression screen PHQ 2/9  Depression screen [facility] 2/9 12/06/2014 10/27/2014  Decreased Interest 2 0  Down, Depressed, Hopeless 0 0  PHQ - 2 Score 2 0  Altered sleeping 0 -  Tired, decreased energy 0 -  Change in appetite 0 -  Feeling bad or failure about yourself  1 -  Trouble concentrating 0 -  Moving slowly or fidgety/restless 2 -  Suicidal thoughts 0 -  PHQ-9 Score 5 -      Review of Systems  Constitutional:       Weight loss  Cardiovascular: Positive for leg swelling.  Endocrine:       High blood sugar  Musculoskeletal: Positive for gait problem.  Neurological: Positive for weakness.  All other systems reviewed and are negative.      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 Right MCA infarct with left spastic hemiparesis, patient has made good improvements with his clenched fist spasticity left upper extremity. He is also starting to work on moving out of synergy, his progress is also influenced by his lack of sensory input in the left upper and left lower extremity. We discussed mirror therapy, trying to use a mirror to aid in visualization of the left side during attempted movements  We'll reassess in 6 weeks may need repeat Botox at that time

## 2015-01-02 NOTE — Patient Instructions (Signed)
Concentrate on extension

## 2015-01-03 ENCOUNTER — Ambulatory Visit: Payer: 59 | Attending: Physical Medicine & Rehabilitation | Admitting: Physical Therapy

## 2015-01-03 ENCOUNTER — Ambulatory Visit: Payer: 59 | Admitting: Occupational Therapy

## 2015-01-03 DIAGNOSIS — R414 Neurologic neglect syndrome: Secondary | ICD-10-CM | POA: Diagnosis not present

## 2015-01-03 DIAGNOSIS — R201 Hypoesthesia of skin: Secondary | ICD-10-CM

## 2015-01-03 DIAGNOSIS — I69398 Other sequelae of cerebral infarction: Secondary | ICD-10-CM | POA: Diagnosis not present

## 2015-01-03 DIAGNOSIS — G8194 Hemiplegia, unspecified affecting left nondominant side: Secondary | ICD-10-CM

## 2015-01-03 DIAGNOSIS — I69354 Hemiplegia and hemiparesis following cerebral infarction affecting left non-dominant side: Secondary | ICD-10-CM | POA: Insufficient documentation

## 2015-01-03 DIAGNOSIS — R4189 Other symptoms and signs involving cognitive functions and awareness: Secondary | ICD-10-CM

## 2015-01-03 DIAGNOSIS — I6931 Cognitive deficits following cerebral infarction: Secondary | ICD-10-CM | POA: Diagnosis not present

## 2015-01-03 DIAGNOSIS — R269 Unspecified abnormalities of gait and mobility: Secondary | ICD-10-CM

## 2015-01-03 NOTE — Therapy (Signed)
[facility] [address] [place], [place], [address] Phone: [phone]   Fax:  [phone]  Occupational Therapy Treatment  Patient Details  Name: Brian Hall MRN: [id number] Date of Birth: 1956/05/05 Referring Provider:  Claretta Fraise, MD  Encounter Date: 01/03/2015      OT End of Session - 01/03/15 1514    Visit Number 4   Number of Visits 16   Date for OT Re-Evaluation 02/15/15   Authorization Type wife states pt is on [employer]   OT Start Time 1405   OT Stop Time 1445   OT Time Calculation (min) 40 min   Activity Tolerance Patient tolerated treatment well   Behavior During Therapy [facility] for tasks assessed/performed      Past Medical History  Diagnosis Date  . Hypertension   . Diabetes mellitus   . Obesity   . CHF (congestive heart failure)   . SOB (shortness of breath)   . Hyperkalemia   . Renal insufficiency   . Cardiogenic shock   . Cardiac LV ejection fraction 10-20%   . Hyperlipemia   . Hypothyroidism   . Throat cancer 2011    s/p neck dissection, radiation, chemo    Past Surgical History  Procedure Laterality Date  . Cardiac catheterization    . Neck dissection Right 2011    s/p resection of throat cancer with multiple nodes removed    There were no vitals filed for this visit.  Visit Diagnosis:  Hemiplegia affecting left nondominant side  Left-sided neglect  Impaired cognition  Impaired sensation      Subjective Assessment - 01/03/15 1404    Subjective  My wrist is not hurting like it was!   Patient Stated Goals Get back to doing some of the things I used to do.   Currently in Pain? Yes   Pain Score 2    Pain Location Wrist   Pain Orientation Left   Pain Descriptors / Indicators Aching   Pain Type Chronic pain   Pain Onset More than a month ago   Pain Frequency Intermittent   Aggravating Factors  arm hanging down   Pain Relieving Factors repositioning   Multiple Pain  Sites No        Treatment: supine shoulder flexion and chest press, 10-20 reps each with mod facillitation at elbow and min v.c. to avoid compensation and not to hold breath.  Pt/ wife were instructed in HEP with cane, pt's wife returned demonstration. (Gentle shoulder abduction,internal/ external rotation AA/ROM/ P/ROM performed by second therapist Phineas Real OTR/L, while therapist typed pt instructions)    Seated A/ROM trunk rotation with hands clasped together followed by weightbearing through left elbow with right shoulder abduction, min facilitation. UBE with LUE wrapped for reciprocal movement, min-mod facillitation.                      OT Education - 01/03/15 1526    Education provided Yes   Education Details cane shoulder flexion   Person(s) Educated Patient;Spouse   Methods Explanation;Demonstration;Verbal cues;Handout;Tactile cues   Comprehension Verbalized understanding;Returned demonstration          OT Short Term Goals - 12/12/14 1742    OT SHORT TERM GOAL #1   Title Pt and wife will be mod I with HEP - 01/18/2015.  Goal dates adjusted as pt will not return to clinic prior to 12/21/14   Status New   OT SHORT TERM GOAL #2   Title  Pt will require min a for LB dressing   Status New   OT SHORT TERM GOAL #3   Title Pt will demonstrate ability to use LUE as gross assist during basic self care 50% of the time.   Status New   OT SHORT TERM GOAL #4   Title Pt will need no more than min vc's to compensate for L neglect during functional mobility tasks   Status New   OT SHORT TERM GOAL #5   Title Pt will demonstrate ability to attend to familiar functional task for 15 minute with no more than one vc.   Status New   Additional Short Term Goals   Additional Short Term Goals Yes   OT SHORT TERM GOAL #6   Title Pt will require no more than min assist for toilet transfers   Status New           OT Long Term Goals - 12/12/14 1746    OT LONG TERM GOAL #1    Title Pt and wife will be mod I with upgraded HEP - 02/15/2015. Goal date adjusted as pt will not return to clinic prior to 12/21/2014   Status New   OT LONG TERM GOAL #2   Title Pt will demonstrate ability to use LUE as gross assist 100% of the time in basic self care tasks   Status New   OT LONG TERM GOAL #3   Title Pt will demonstrate ability to hold cylindrical object in L hand.    Status New   OT LONG TERM GOAL #4   Title Pt will verbalize understanding of AE for buttoning, zipping and cutting   Status New   OT LONG TERM GOAL #5   Title Pt will be supervision for toilet transfers   Status New   Long Term Additional Goals   Additional Long Term Goals Yes   OT LONG TERM GOAL #6   Title Pt will be min a for shower transfers   Status New   OT LONG TERM GOAL #7   Title Pt will  be supervision for simple hot meal prep   Status New               Plan - 01/03/15 1511    Clinical Impression Statement Pt is progressing towards goals. He reports decreased wrist pain after wearing splint.   Rehab Potential Good   OT Frequency 2x / week   OT Duration 8 weeks   OT Treatment/Interventions Self-care/ADL training;Cryotherapy;Moist Heat;Electrical Stimulation;Fluidtherapy;DME and/or AE instruction;Energy conservation;Neuromuscular education;Therapeutic exercise;Functional Mobility Training;Manual Therapy;Passive range of motion;Splinting;Therapeutic activities;Balance training;Patient/family education;Cognitive remediation/compensation   Plan neuro re-ed, supine, weightbearing   OT Home Exercise Plan issued inital HEP-12/27/14, cane shoulder flexion 01/03/15   Consulted and Agree with Plan of Care Patient;Family member/caregiver        Problem List Patient Active Problem List   Diagnosis Date Noted  . Spastic hemiparesis affecting nondominant side 11/08/2014  . Hypothyroidism 10/27/2014  . Left-sided neglect 09/23/2014  . Acute ischemic right MCA stroke 09/16/2014  . Left  hemiparesis   . CVA (cerebral vascular accident)   . Right carotid artery occlusion   . Acute right MCA stroke   . Essential hypertension 09/12/2014  . CVA (cerebral infarction) 09/12/2014  . Type 2 diabetes mellitus with peripheral neuropathy 09/12/2014    RINE,KATHRYN 01/03/2015, 3:26 PM  [facility] [address] [place], [place], [address] Phone: [phone]   Fax:  [phone]

## 2015-01-03 NOTE — Therapy (Signed)
[facility] [address] [place], [place], [address] Phone: [phone]   Fax:  [phone]  Physical Therapy Treatment  Patient Details  Name: Brian Hall MRN: [id number] Date of Birth: 08-08-1956 Referring Provider:  Claretta Fraise, MD  Encounter Date: 01/03/2015      PT End of Session - 01/03/15 1420    Visit Number 4   Number of Visits 17   Date for PT Re-Evaluation 02/10/15   PT Start Time 1321  Pt in restroom for 5 minutes   PT Stop Time 1401   PT Time Calculation (min) 40 min   Equipment Utilized During Treatment Gait belt   Activity Tolerance Patient tolerated treatment well   Behavior During Therapy [facility] for tasks assessed/performed      Past Medical History  Diagnosis Date  . Hypertension   . Diabetes mellitus   . Obesity   . CHF (congestive heart failure)   . SOB (shortness of breath)   . Hyperkalemia   . Renal insufficiency   . Cardiogenic shock   . Cardiac LV ejection fraction 10-20%   . Hyperlipemia   . Hypothyroidism   . Throat cancer 2011    s/p neck dissection, radiation, chemo    Past Surgical History  Procedure Laterality Date  . Cardiac catheterization    . Neck dissection Right 2011    s/p resection of throat cancer with multiple nodes removed    There were no vitals filed for this visit.  Visit Diagnosis:  Hemiplegia affecting left nondominant side  Left-sided neglect  Abnormality of gait      Subjective Assessment - 01/03/15 1325    Subjective No changes since last visit.  Describes pain in L ankle as burning.   Patient is accompained by: Family member   Currently in Pain? Yes   Pain Score 1    Pain Location Ankle   Pain Orientation Left   Pain Descriptors / Indicators Aching;Burning   Pain Type Chronic pain   Aggravating Factors  positioning of brace   Pain Relieving Factors repositioning     Pt reports doing his exercises at home with wife's assistance.   Wife reports he needs continued cues to slow pace of exercises, and does not remember to do that on his own.  Prior to beginning standing activities in parallel bars, pt requests bathroom break.   Neuro Re-education: -In parallel bars-weightbearing through LLE as stance leg with R step taps 2 sets x 10 reps at 4" block  -Standing with widened base of support with lateral weightshifting, with focus on increased weightshift to the L side.  Standing with wide base of support and equal weightbearing with RUE lifts from bar, for decreased UE support in standing.  PT provides postural cues and min assistance. -Weightbearing through LLE as stance leg with R foot propped on 4" block with decreased UE support through R UE, with minimal to mod assist and tactile cues for upright standing.   Gait: -Gait activities in parallel bars: Forward/back gait 4 reps x 10 ft each direction, with cues for increased weightshifting and increased stance time on LLE. Gait training using large base quad cane x 20 ft, then 120 ft. With large base quad cane, pt noted to take increased step on RLE, allowing for initial hip and knee flexion at beginning of swing phase on LLE.  PT provides min assistance for improved weight shift to LLE.  Therapeutic Exercise: -Sit<>suipine on mat with min-mod assistance.  Hooklying position-L hip and knee flexion off side of mat, 2 sets x 10 reps.  Hooklying marching 2 sets x 10 reps-added to HEP.                                  PT Education - 01/03/15 1419    Education provided Yes   Education Details Hooklying marching added to HEP   Person(s) Educated Patient;Spouse   Methods Explanation;Demonstration;Handout   Comprehension Verbalized understanding;Returned demonstration          PT Short Term Goals - 12/14/14 1306    PT SHORT TERM GOAL #1   Title Pt will perform HEP with supervision of family, for improved strength and balance. (Target 01/10/15)    Time 4   Period Weeks   Status New   PT SHORT TERM GOAL #2   Title Pt will improve Timed UP and Go score to less than or equal to 35 seconds for decreased fall risk.   Baseline eval-46.78 sec   Time 4   Period Weeks   Status New   PT SHORT TERM GOAL #3   Title Berg Balance score to be assessed, with pt scoring at least 5 additional points to decrease fall risk.   Time 4   Period Weeks   Status New   PT SHORT TERM GOAL #4   Title Pt will perform at least 6 of 10 reps of sit<>stand transfers no UE support, with equal weightbearing through bilateral lower extremities, independently.   Time 4   Period Weeks   Status New           PT Long Term Goals - 12/14/14 1310    PT LONG TERM GOAL #1   Title Pt will verbalize understanding of fall prevention within home environment. Target 02/10/15   Time 8   Period Weeks   Status New   PT LONG TERM GOAL #2   Title Pt will improve TUG to less than or equal to 25 seconds for decreased fall risk.   Time 8   Period Weeks   Status New   PT LONG TERM GOAL #3   Title Pt will improve gait velocity to at least 1 ft/sec for improved gait efficiency and safety.   Time 8   Period Weeks   Status New   PT LONG TERM GOAL #4   Title Pt will ambulate at least 50-100 ft using hemiwalker, household distances, with supervision.   Time 8   Status New               Plan - 01/03/15 1421    Clinical Impression Statement Pt appears to have slightly improved control of LLE during standing exercises in parallel bars.  With use of large base quad cane, pt continues to have more upright posture and decreased R lean.  Pt continues to need cues for slowed pasce of gait and exercises   Pt will benefit from skilled therapeutic intervention in order to improve on the following deficits Abnormal gait;Decreased activity tolerance;Decreased balance;Decreased mobility;Decreased range of motion;Decreased safety awareness;Difficulty walking;Decreased  strength;Impaired tone   Rehab Potential Good   PT Frequency 2x / week   PT Duration 8 weeks  wk 2 of 8   PT Treatment/Interventions ADLs/Self Care Home Management;Therapeutic activities;Functional mobility training;Stair training;Gait training;Therapeutic exercise;Balance training;Neuromuscular re-education;Patient/family education   PT Next Visit Plan continue LLE and equal weightbearing, posture and abdominal activation; gait training with  large base quad cane   Consulted and Agree with Plan of Care Patient;Family member/caregiver   Family Member Consulted wife        Problem List Patient Active Problem List   Diagnosis Date Noted  . Spastic hemiparesis affecting nondominant side 11/08/2014  . Hypothyroidism 10/27/2014  . Left-sided neglect 09/23/2014  . Acute ischemic right MCA stroke 09/16/2014  . Left hemiparesis   . CVA (cerebral vascular accident)   . Right carotid artery occlusion   . Acute right MCA stroke   . Essential hypertension 09/12/2014  . CVA (cerebral infarction) 09/12/2014  . Type 2 diabetes mellitus with peripheral neuropathy 09/12/2014    Therese Rocco W. 01/04/2015, 8:15 AM [facility] [address] [place], [place], [address] Phone: [phone]   Fax:  [phone]

## 2015-01-03 NOTE — Patient Instructions (Signed)
Laying on your back, hold a cane with the bent portion in left hand(leading with your thumb) and raise arms up to 90*(shoulder height)Have your wife assist at elbow to keep it from coming out to the side, 2 sets of 10 reps 1x day

## 2015-01-03 NOTE — Patient Instructions (Signed)
Bent Leg Lift (Hook-Lying)   Tighten stomach and slowly raise right leg __3__ inches from floor, then slowly lower down.  Alternate legs, as in marching Repeat __2 sets of 10__ times per set.  Do __2__ sessions per day.  http://orth.exer.us/1090   Copyright  VHI. All rights reserved.

## 2015-01-05 ENCOUNTER — Ambulatory Visit: Payer: 59 | Admitting: Occupational Therapy

## 2015-01-05 ENCOUNTER — Ambulatory Visit: Payer: 59 | Admitting: Physical Therapy

## 2015-01-05 DIAGNOSIS — G8194 Hemiplegia, unspecified affecting left nondominant side: Secondary | ICD-10-CM

## 2015-01-05 DIAGNOSIS — Z7409 Other reduced mobility: Secondary | ICD-10-CM

## 2015-01-05 DIAGNOSIS — R269 Unspecified abnormalities of gait and mobility: Secondary | ICD-10-CM

## 2015-01-05 DIAGNOSIS — I6931 Cognitive deficits following cerebral infarction: Secondary | ICD-10-CM | POA: Diagnosis not present

## 2015-01-05 DIAGNOSIS — R414 Neurologic neglect syndrome: Secondary | ICD-10-CM

## 2015-01-05 NOTE — Therapy (Signed)
[facility] [address] [place], [place], [address] Phone: [phone]   Fax:  [phone]  Physical Therapy Treatment  Patient Details  Name: Brian Hall MRN: [id number] Date of Birth: 1955-12-10 Referring Provider:  Claretta Fraise, MD  Encounter Date: 01/05/2015      PT End of Session - 01/05/15 1507    Visit Number 5   Number of Visits 17   Date for PT Re-Evaluation 02/10/15   PT Start Time 6962   PT Stop Time 1402   PT Time Calculation (min) 45 min   Equipment Utilized During Treatment Gait belt   Activity Tolerance Patient tolerated treatment well   Behavior During Therapy [facility] for tasks assessed/performed      Past Medical History  Diagnosis Date  . Hypertension   . Diabetes mellitus   . Obesity   . CHF (congestive heart failure)   . SOB (shortness of breath)   . Hyperkalemia   . Renal insufficiency   . Cardiogenic shock   . Cardiac LV ejection fraction 10-20%   . Hyperlipemia   . Hypothyroidism   . Throat cancer 2011    s/p neck dissection, radiation, chemo    Past Surgical History  Procedure Laterality Date  . Cardiac catheterization    . Neck dissection Right 2011    s/p resection of throat cancer with multiple nodes removed    There were no vitals filed for this visit.  Visit Diagnosis:  Abnormality of gait  Hemiplegia affecting left nondominant side      Subjective Assessment - 01/05/15 1324    Subjective Denies changes or falls since last visit.   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  He has a motorcycle and would like to ride it again.   Currently in Pain? Yes   Pain Score 0-No pain  burning   Pain Location Ankle   Pain Orientation Right   Pain Descriptors / Indicators Burning   Pain Type Chronic pain   Pain Onset More than a month  ago   Pain Frequency Intermittent   Multiple Pain Sites No                         [facility] Adult PT Treatment/Exercise - 01/05/15 1458    Transfers   Transfers Sit to Stand;Stand to Sit   Sit to Stand 5: Supervision   Sit to Stand Details (indicate cue type and reason) verbal and tactile cues for equal weight bearing bil LE's; tends to push with RLE and lean to R   Stand to Sit 5: Supervision   Stand to Sit Details cues for equal weight bearing and to control descent   Ambulation/Gait   Ambulation/Gait Yes   Ambulation/Gait Assistance 4: Min guard   Ambulation Distance (Feet) 100 Feet  50 and multiple reps in parallel bars   Assistive device Large base quad cane;Parallel bars   Gait Pattern Step-to pattern;Decreased step length - left;Decreased stance time - left;Decreased hip/knee flexion - left;Decreased dorsiflexion - left;Decreased weight shift to left;Lateral trunk lean to left;Decreased trunk rotation;Narrow base of support   Ambulation Surface Level;Indoor   Gait Comments Ambulation in parallel bars with 1 UE support and mirror for visual feedback.  Cues for hip flexion and heel strike on L.  Narrow BOS (LLE  tends to adduct) so used wooden divider in bars to encourage increased BOS.  Cues for upright posture.   Knee/Hip Exercises: Standing   Functional Squat 5 reps;3 seconds;Other (comment)  in parallel bars with 1 UE support   Functional Squat Limitations squats to stool in parallel bars and mini squats;also performed x 10 with mat elevated standing and partial squat toward mat but returned to upright before sitting to work on strengthening and control.   Other Standing Knee Exercises stepping L LE onto 4" step x 25 in parallel bars with 1 UE support encouraging L hip and knee flexion and avoiding circumduction.   Other Standing Knee Exercises sidestepping in parallel bars for hip abduction and adduction strengthening;also standing with LLE on slide board with pillow  case on top for hip abdcution/adduction x 15                  PT Short Term Goals - 12/14/14 1306    PT SHORT TERM GOAL #1   Title Pt will perform HEP with supervision of family, for improved strength and balance. (Target 01/10/15)   Time 4   Period Weeks   Status New   PT SHORT TERM GOAL #2   Title Pt will improve Timed UP and Go score to less than or equal to 35 seconds for decreased fall risk.   Baseline eval-46.78 sec   Time 4   Period Weeks   Status New   PT SHORT TERM GOAL #3   Title Berg Balance score to be assessed, with pt scoring at least 5 additional points to decrease fall risk.   Time 4   Period Weeks   Status New   PT SHORT TERM GOAL #4   Title Pt will perform at least 6 of 10 reps of sit<>stand transfers no UE support, with equal weightbearing through bilateral lower extremities, independently.   Time 4   Period Weeks   Status New           PT Long Term Goals - 12/14/14 1310    PT LONG TERM GOAL #1   Title Pt will verbalize understanding of fall prevention within home environment. Target 02/10/15   Time 8   Period Weeks   Status New   PT LONG TERM GOAL #2   Title Pt will improve TUG to less than or equal to 25 seconds for decreased fall risk.   Time 8   Period Weeks   Status New   PT LONG TERM GOAL #3   Title Pt will improve gait velocity to at least 1 ft/sec for improved gait efficiency and safety.   Time 8   Period Weeks   Status New   PT LONG TERM GOAL #4   Title Pt will ambulate at least 50-100 ft using hemiwalker, household distances, with supervision.   Time 8   Status New               Plan - 01/05/15 1507    Clinical Impression Statement Pt motivated to improve mobility and strength.  Works hard during treatment with little rest needed.  Continue PT per POC.   Pt will benefit from skilled therapeutic intervention in order to improve on the following deficits Abnormal gait;Decreased activity tolerance;Decreased  balance;Decreased mobility;Decreased range of motion;Decreased safety awareness;Difficulty walking;Decreased strength;Impaired tone   Rehab Potential Good   PT Frequency 2x / week   PT Duration 8 weeks   PT Treatment/Interventions ADLs/Self Care Home Management;Therapeutic activities;Functional mobility training;Stair training;Gait  training;Therapeutic exercise;Balance training;Neuromuscular re-education;Patient/family education   PT Next Visit Plan continue LLE and equal weightbearing, posture and abdominal activation; gait training with large base quad cane   Consulted and Agree with Plan of Care Patient;Family member/caregiver   Family Member Consulted wife        Problem List Patient Active Problem List   Diagnosis Date Noted  . Spastic hemiparesis affecting nondominant side 11/08/2014  . Hypothyroidism 10/27/2014  . Left-sided neglect 09/23/2014  . Acute ischemic right MCA stroke 09/16/2014  . Left hemiparesis   . CVA (cerebral vascular accident)   . Right carotid artery occlusion   . Acute right MCA stroke   . Essential hypertension 09/12/2014  . CVA (cerebral infarction) 09/12/2014  . Type 2 diabetes mellitus with peripheral neuropathy 09/12/2014    Narda Bonds 01/05/2015, 3:09 PM  [facility] [address] [place], [place], [address] Phone: [phone]   Fax:  [place], [place] 01/05/2015 3:09 PM Phone: [phone] Fax: [phone]

## 2015-01-05 NOTE — Therapy (Signed)
[facility] [address] [place], [place], [address] Phone: [phone]   Fax:  [phone]  Occupational Therapy Treatment  Patient Details  Name: Brian Hall MRN: [id number] Date of Birth: November 06, 1955 Referring Provider:  Claretta Fraise, MD  Encounter Date: 01/05/2015      OT End of Session - 01/05/15 1544    Visit Number 5   Number of Visits 16   Date for OT Re-Evaluation 02/15/15   Authorization Type wife states pt is on cobra   OT Start Time 1403   OT Stop Time 1447   OT Time Calculation (min) 44 min   Activity Tolerance Patient tolerated treatment well      Past Medical History  Diagnosis Date  . Hypertension   . Diabetes mellitus   . Obesity   . CHF (congestive heart failure)   . SOB (shortness of breath)   . Hyperkalemia   . Renal insufficiency   . Cardiogenic shock   . Cardiac LV ejection fraction 10-20%   . Hyperlipemia   . Hypothyroidism   . Throat cancer 2011    s/p neck dissection, radiation, chemo    Past Surgical History  Procedure Laterality Date  . Cardiac catheterization    . Neck dissection Right 2011    s/p resection of throat cancer with multiple nodes removed    There were no vitals filed for this visit.  Visit Diagnosis:  Hemiplegia affecting left nondominant side  Impaired functional mobility and activity tolerance  Left-sided neglect      Subjective Assessment - 01/05/15 1405    Subjective  I feel ok, no pain in wrist at rest.    Patient Stated Goals Get back to doing some of the things I used to do.   Currently in Pain? Yes   Pain Score 3   burning sensation   Pain Location Ankle   Pain Orientation Left   Pain Descriptors / Indicators Burning   Pain Type Chronic pain   Pain Onset More than a month ago   Pain Frequency Intermittent                      OT Treatments/Exercises (OP) - 01/05/15 0001    Neurological Re-education Exercises   Shoulder Flexion AAROM;10 reps;Supine;Left  using cane for correct positioning during NRE   Shoulder ABduction AAROM;Left;10 reps;Supine;Therapy ball  using cane to aid in correct position during NRE   Elbow Flexion AAROM;Left;10 reps;Seated  gravity eliminated, LUE resting on table   Elbow Extension AAROM;10 reps;Seated;Left  gravity eliminated with LUE resting on table   Weight Bearing Technique   Weight Bearing Technique Yes  standing at table, weight thru LUE and reaching w/ RUE      Educated patient on slowing down with movements and completing step-by-step prior to going on to next step or activity. As stated above, patient stood at table top for Neuro re-ed > LUE for activation, stabilization, and increased input throughout LUE, A-P weight shift; requiring mod assist during re-edu secondary to impulsivity and decreased proprioception throughout LUE. For NMR, patient reached with RUE to drop washers on vertical pole at various heights. Patient's wife present during entire session and provided support appropriately and prn.           OT Education - 01/05/15 1530    Education provided Yes   Education Details gravity vs gravity eliminated positions for exercises    Person(s) Educated Patient;Spouse   Methods Explanation;Demonstration  Comprehension Verbalized understanding;Returned demonstration          OT Short Term Goals - 12/12/14 1742    OT SHORT TERM GOAL #1   Title Pt and wife will be mod I with HEP - 01/18/2015.  Goal dates adjusted as pt will not return to clinic prior to 12/21/14   Status New   OT SHORT TERM GOAL #2   Title Pt will require min a for LB dressing   Status New   OT SHORT TERM GOAL #3   Title Pt will demonstrate ability to use LUE as gross assist during basic self care 50% of the time.   Status New   OT SHORT TERM GOAL #4   Title Pt will need no more than min vc's to compensate for L neglect during functional mobility tasks   Status New   OT  SHORT TERM GOAL #5   Title Pt will demonstrate ability to attend to familiar functional task for 15 minute with no more than one vc.   Status New   Additional Short Term Goals   Additional Short Term Goals Yes   OT SHORT TERM GOAL #6   Title Pt will require no more than min assist for toilet transfers   Status New           OT Long Term Goals - 12/12/14 1746    OT LONG TERM GOAL #1   Title Pt and wife will be mod I with upgraded HEP - 02/15/2015. Goal date adjusted as pt will not return to clinic prior to 12/21/2014   Status New   OT LONG TERM GOAL #2   Title Pt will demonstrate ability to use LUE as gross assist 100% of the time in basic self care tasks   Status New   OT LONG TERM GOAL #3   Title Pt will demonstrate ability to hold cylindrical object in L hand.    Status New   OT LONG TERM GOAL #4   Title Pt will verbalize understanding of AE for buttoning, zipping and cutting   Status New   OT LONG TERM GOAL #5   Title Pt will be supervision for toilet transfers   Status New   Long Term Additional Goals   Additional Long Term Goals Yes   OT LONG TERM GOAL #6   Title Pt will be min a for shower transfers   Status New   OT LONG TERM GOAL #7   Title Pt will  be supervision for simple hot meal prep   Status New               Plan - 01/05/15 1541    Clinical Impression Statement Patient continues to progress towards goals and is motivated. He continues to state that wrist pain is decreasing, but with "stinging" sensation during weight bearing exercises.    Plan continue with neuro re-ed (PNF, supine position, etc), weightbearing (standing table top exercises), safety with sit<>stands and functional transfers    Consulted and Agree with Plan of Care Patient;Family member/caregiver        Problem List Patient Active Problem List   Diagnosis Date Noted  . Spastic hemiparesis affecting nondominant side 11/08/2014  . Hypothyroidism 10/27/2014  . Left-sided neglect  09/23/2014  . Acute ischemic right MCA stroke 09/16/2014  . Left hemiparesis   . CVA (cerebral vascular accident)   . Right carotid artery occlusion   . Acute right MCA stroke   . Essential hypertension 09/12/2014  .  CVA (cerebral infarction) 09/12/2014  . Type 2 diabetes mellitus with peripheral neuropathy 09/12/2014    Karin Golden, OTR/L [phone] Phone: [phone] 4:15 PM 01/05/2015 Chrys Racer, [place]., OTR/L 01/05/2015, 3:46 PM  [facility] [address] [place], [place], [address] Phone: [phone]   Fax:  [phone]

## 2015-01-09 ENCOUNTER — Telehealth: Payer: Self-pay | Admitting: Family Medicine

## 2015-01-09 ENCOUNTER — Encounter: Payer: Self-pay | Admitting: Family Medicine

## 2015-01-09 ENCOUNTER — Ambulatory Visit (INDEPENDENT_AMBULATORY_CARE_PROVIDER_SITE_OTHER): Payer: 59 | Admitting: Family Medicine

## 2015-01-09 VITALS — BP 114/65 | HR 68 | Temp 97.0°F | Ht 71.0 in | Wt 236.8 lb

## 2015-01-09 DIAGNOSIS — I10 Essential (primary) hypertension: Secondary | ICD-10-CM

## 2015-01-09 DIAGNOSIS — E785 Hyperlipidemia, unspecified: Secondary | ICD-10-CM | POA: Diagnosis not present

## 2015-01-09 DIAGNOSIS — E1140 Type 2 diabetes mellitus with diabetic neuropathy, unspecified: Secondary | ICD-10-CM

## 2015-01-09 DIAGNOSIS — R609 Edema, unspecified: Secondary | ICD-10-CM

## 2015-01-09 DIAGNOSIS — E038 Other specified hypothyroidism: Secondary | ICD-10-CM

## 2015-01-09 DIAGNOSIS — I639 Cerebral infarction, unspecified: Secondary | ICD-10-CM

## 2015-01-09 DIAGNOSIS — E1142 Type 2 diabetes mellitus with diabetic polyneuropathy: Secondary | ICD-10-CM

## 2015-01-09 LAB — POCT GLYCOSYLATED HEMOGLOBIN (HGB A1C): HEMOGLOBIN A1C: 5.1

## 2015-01-09 MED ORDER — TRAMADOL HCL 50 MG PO TABS: 50.0000 mg | ORAL_TABLET | Freq: Four times a day (QID) | ORAL | Status: DC | PRN

## 2015-01-09 NOTE — Progress Notes (Addendum)
Subjective:  Patient ID: Brian Hall, male    DOB: [birth year]  Age: 59 y.o. MRN: [id number]  CC: Diabetes   HPI [name] presents for  follow-up of hypertension. Patient has no history of headache chest pain or shortness of breath or recent cough. Patient also denies symptoms of TIA such as numbness weakness lateralizing. Patient checks  blood pressure at home and has not had any elevated readings recently. Some running low. <973 systolic. Patient denies side effects from his medication. States taking it regularly.  Patient also  in for follow-up of elevated cholesterol. Doing well without complaints on current medication. Denies side effects of statin including myalgia and arthralgia and nausea. Also in today for liver function testing. Currently no chest pain, shortness of breath or other cardiovascular related symptoms noted.  Follow-up of diabetes. Patient does check blood sugar at home. Readings run between 75 and 125 Patient denies symptoms such as polyuria, polydipsia, excessive hunger, nausea No significant hypoglycemic spells noted. Medications as noted below. Taking them regularly without complication/adverse reaction being reported today.    History Brian Hall has a past medical history of Hypertension; Diabetes mellitus; Obesity; CHF (congestive heart failure); SOB (shortness of breath); Hyperkalemia; Renal insufficiency; Cardiogenic shock; Cardiac LV ejection fraction 10-20%; Hyperlipemia; Hypothyroidism; and Throat cancer (2011).   He has past surgical history that includes Cardiac catheterization and Neck dissection (Right, 2011).   His family history includes Hypertension in his father and mother.He reports that he has never smoked. He does not have any smokeless tobacco history on file. He reports that he does not drink alcohol or use illicit drugs.  Current Outpatient Prescriptions on File Prior to Visit  Medication Sig Dispense Refill  . aspirin 81 MG tablet  Take 1 tablet (81 mg total) by mouth daily. 30 tablet 11  . carvedilol (COREG) 12.5 MG tablet Take 1 tablet (12.5 mg total) by mouth 2 (two) times daily. 60 tablet 1  . cholecalciferol (VITAMIN D) 400 UNITS TABS tablet Take 400 Units by mouth daily.    . clopidogrel (PLAVIX) 75 MG tablet Take 1 tablet (75 mg total) by mouth daily. 30 tablet 2  . glimepiride (AMARYL) 2 MG tablet Take 1 tablet (2 mg total) by mouth daily with breakfast. 30 tablet 2  . levothyroxine (SYNTHROID, LEVOTHROID) 88 MCG tablet Take 1 tablet (88 mcg total) by mouth daily before breakfast. 30 tablet 3  . lisinopril (PRINIVIL,ZESTRIL) 40 MG tablet Take 1 tablet (40 mg total) by mouth daily. 30 tablet 1  . Multiple Vitamins-Minerals (MULTIVITAMIN WITH MINERALS) tablet Take 1 tablet by mouth daily.    . nebivolol (BYSTOLIC) 10 MG tablet Take 1 tablet (10 mg total) by mouth daily. 30 tablet 5  . omeprazole (PRILOSEC) 40 MG capsule Take 1 capsule (40 mg total) by mouth daily. 30 capsule 1  . tiZANidine (ZANAFLEX) 2 MG tablet Take 1 tablet (2 mg total) by mouth 3 (three) times daily. 90 tablet 1  . linagliptin (TRADJENTA) 5 MG TABS tablet Take 1 tablet (5 mg total) by mouth daily. (Patient not taking: Reported on 01/09/2015) 30 tablet 1   No current facility-administered medications on file prior to visit.    ROS Review of Systems  Constitutional: Negative for fever, chills and diaphoresis.  HENT: Negative for congestion, rhinorrhea and sore throat.   Respiratory: Negative for cough, shortness of breath and wheezing.   Cardiovascular: Negative for chest pain.  Gastrointestinal: Negative for nausea, vomiting, abdominal pain, diarrhea, constipation and abdominal  distention.  Genitourinary: Negative for dysuria and frequency.  Musculoskeletal: Negative for joint swelling and arthralgias.  Skin: Negative for rash.  Neurological: Negative for headaches.    Objective:  BP 114/65 mmHg  Pulse 68  Temp(Src) 97 F (36.1 C) (Oral)   Ht _0  (1.803 m)  Wt 236 lb 12.8 oz (107.412 kg)  BMI 33.04 kg/m2  BP Readings from Last 3 Encounters:  01/09/15 114/65  01/02/15 142/65  12/06/14 142/64    Wt Readings from Last 3 Encounters:  01/09/15 236 lb 12.8 oz (107.412 kg)  11/28/14 220 lb (99.791 kg)  10/27/14 238 lb (107.956 kg)     Physical Exam  Constitutional: He is oriented to person, place, and time. He appears well-developed and well-nourished. No distress.  HENT:  Head: Normocephalic and atraumatic.  Right Ear: External ear normal.  Left Ear: External ear normal.  Nose: Nose normal.  Mouth/Throat: Oropharynx is clear and moist.  Eyes: Conjunctivae and EOM are normal. Pupils are equal, round, and reactive to light.  Neck: Normal range of motion. Neck supple. No thyromegaly present.  Cardiovascular: Normal rate, regular rhythm and normal heart sounds.   No murmur heard. Pulmonary/Chest: Effort normal and breath sounds normal. No respiratory distress. He has no wheezes. He has no rales.  Abdominal: Soft. Bowel sounds are normal. He exhibits no distension. There is no tenderness.  Lymphadenopathy:    He has no cervical adenopathy.  Neurological: He is alert and oriented to person, place, and time. He exhibits abnormal muscle tone (Left side has decreased strength and partial hemiplegia with patient being wheelchair bound due to left lower extremity weakness and resultant instability). Coordination abnormal.  Skin: Skin is warm and dry.  Psychiatric: He has a normal mood and affect. His behavior is normal. Judgment and thought content normal.    Lab Results  Component Value Date   HGBA1C 5.1 01/09/2015   HGBA1C 7.7* 09/12/2014    Lab Results  Component Value Date   WBC 7.8 10/27/2014   HGB 14.0* 10/27/2014   HCT 43.6 10/27/2014   PLT 161 09/19/2014   GLUCOSE 71 10/27/2014   CHOL 162 11/28/2014   TRIG 278* 11/28/2014   HDL 25* 11/28/2014   LDLCALC 88 09/12/2014   ALT 15 10/27/2014   AST 15  10/27/2014   NA 144 10/27/2014   K 5.0 10/27/2014   CL 106 10/27/2014   CREATININE 1.15 10/27/2014   BUN 19 10/27/2014   CO2 24 10/27/2014   TSH 3.690 10/27/2014   INR 0.95 09/12/2014   HGBA1C 5.1 01/09/2015    No results found.  Assessment & Plan:   Dawood was seen today for diabetes.  Diagnoses and all orders for this visit:  Essential hypertension Orders: -     POCT CBC; Standing -     CMP14+EGFR; Standing  Type 2 diabetes mellitus with peripheral neuropathy Orders: -     POCT glycosylated hemoglobin (Hb A1C); Standing -     CMP14+EGFR; Standing -     POCT glycosylated hemoglobin (Hb A1C)  CVA (cerebral vascular accident) Orders: -     POCT CBC; Standing  Other specified hypothyroidism  Hyperlipemia Orders: -     Lipid panel; Standing  Other orders -     traMADol (ULTRAM) 50 MG tablet; Take 1 tablet (50 mg total) by mouth every 6 (six) hours as needed for moderate pain.   I have discontinued Mr. Maher rosuvastatin. I am also having him maintain his multivitamin with minerals, cholecalciferol,  carvedilol, lisinopril, omeprazole, aspirin, nebivolol, tiZANidine, levothyroxine, clopidogrel, glimepiride, linagliptin, ONE TOUCH ULTRA TEST, pravastatin, and traMADol.  Meds ordered this encounter  Medications  . ONE TOUCH ULTRA TEST test strip    Sig:     Refill:  1  . pravastatin (PRAVACHOL) 40 MG tablet    Sig: Take 40 mg by mouth daily.     Refill:  0  . traMADol (ULTRAM) 50 MG tablet    Sig: Take 1 tablet (50 mg total) by mouth every 6 (six) hours as needed for moderate pain.    Dispense:  60 tablet    Refill:  0     Follow-up: Return in about 3 months (around 04/11/2015).  Claretta Fraise, M.D.

## 2015-01-10 ENCOUNTER — Telehealth: Payer: Self-pay | Admitting: Family Medicine

## 2015-01-10 ENCOUNTER — Ambulatory Visit: Payer: 59 | Admitting: Physical Therapy

## 2015-01-10 ENCOUNTER — Other Ambulatory Visit: Payer: Self-pay | Admitting: Family Medicine

## 2015-01-10 ENCOUNTER — Ambulatory Visit (HOSPITAL_COMMUNITY)
Admission: RE | Admit: 2015-01-10 | Discharge: 2015-01-10 | Disposition: A | Discharge status: Home / Self Care | Payer: 59 | Source: Ambulatory Visit | Attending: Family Medicine | Admitting: Family Medicine

## 2015-01-10 ENCOUNTER — Ambulatory Visit: Payer: 59 | Admitting: Occupational Therapy

## 2015-01-10 ENCOUNTER — Encounter: Payer: Self-pay | Admitting: Occupational Therapy

## 2015-01-10 DIAGNOSIS — R414 Neurologic neglect syndrome: Secondary | ICD-10-CM

## 2015-01-10 DIAGNOSIS — M79605 Pain in left leg: Secondary | ICD-10-CM | POA: Diagnosis not present

## 2015-01-10 DIAGNOSIS — G8194 Hemiplegia, unspecified affecting left nondominant side: Secondary | ICD-10-CM

## 2015-01-10 DIAGNOSIS — R600 Localized edema: Secondary | ICD-10-CM | POA: Diagnosis not present

## 2015-01-10 DIAGNOSIS — Z7409 Other reduced mobility: Secondary | ICD-10-CM

## 2015-01-10 DIAGNOSIS — R4189 Other symptoms and signs involving cognitive functions and awareness: Secondary | ICD-10-CM

## 2015-01-10 DIAGNOSIS — R269 Unspecified abnormalities of gait and mobility: Secondary | ICD-10-CM

## 2015-01-10 DIAGNOSIS — I6931 Cognitive deficits following cerebral infarction: Secondary | ICD-10-CM | POA: Diagnosis not present

## 2015-01-10 DIAGNOSIS — R201 Hypoesthesia of skin: Secondary | ICD-10-CM

## 2015-01-10 NOTE — Therapy (Signed)
[facility] [address] [place], [place], [address] Phone: [phone]   Fax:  [phone]  Occupational Therapy Treatment  Patient Details  Name: Brian Hall MRN: [id number] Date of Birth: November 28, 1955 Referring Provider:  Claretta Fraise, MD  Encounter Date: 01/10/2015      OT End of Session - 01/10/15 1252    Visit Number 6   Number of Visits 16   Date for OT Re-Evaluation 02/15/15   OT Start Time 1152   OT Stop Time 1235   OT Time Calculation (min) 43 min   Activity Tolerance Patient tolerated treatment well      Past Medical History  Diagnosis Date  . Hypertension   . Diabetes mellitus   . Obesity   . CHF (congestive heart failure)   . SOB (shortness of breath)   . Hyperkalemia   . Renal insufficiency   . Cardiogenic shock   . Cardiac LV ejection fraction 10-20%   . Hyperlipemia   . Hypothyroidism   . Throat cancer 2011    s/p neck dissection, radiation, chemo    Past Surgical History  Procedure Laterality Date  . Cardiac catheterization    . Neck dissection Right 2011    s/p resection of throat cancer with multiple nodes removed    There were no vitals filed for this visit.  Visit Diagnosis:  Hemiplegia affecting left nondominant side  Impaired functional mobility and activity tolerance  Left-sided neglect  Impaired cognition  Impaired sensation      Subjective Assessment - 01/10/15 1155    Subjective  My left ankle has been swollen since Friday - I have to go to the hospital at 2 today to see if I have a DVT.     Pertinent History see epic snapshot   Patient Stated Goals Get back to doing some of the things I used to do.   Currently in Pain? Yes   Pain Score 5    Pain Location Wrist   Pain Orientation Left   Pain Descriptors / Indicators Stabbing   Pain Type Chronic pain   Pain Onset More than a month ago   Pain Frequency Intermittent   Aggravating Factors  certain  movements, if I get my arm stuck somewhere   Pain Relieving Factors repositoning   Multiple Pain Sites Yes   Pain Score 9   Pain Location Ankle   Pain Orientation Left   Pain Descriptors / Indicators Sharp   Pain Type Acute pain  since Friday   Pain Frequency Constant   Aggravating Factors  everything makes it hurt   Pain Relieving Factors nothing.                       OT Treatments/Exercises (OP) - 01/10/15 0001    Neurological Re-education Exercises   Other Exercises 1 Treatment restricted to LUE in supine due to possbility of DVT in LLE. Pt to see MD today at 2pm to rule out. Neuro re ed to address LUE isolated movement with emphasis on stretching internal capsule with AA movement (external rotation, abduction and shoulder flexion with good alignment) followed by chest presses with PVC square with place and hold and full range of shoulder flexion.extension with place and hold., normalizing ms tone.                   OT Short Term Goals - 01/10/15 1249    OT SHORT TERM GOAL #1  Title Pt and wife will be mod I with HEP - 01/18/2015.  Goal dates adjusted as pt will not return to clinic prior to 12/21/14   Status On-going   OT SHORT TERM GOAL #2   Title Pt will require min a for LB dressing   Status On-going   OT SHORT TERM GOAL #3   Title Pt will demonstrate ability to use LUE as gross assist during basic self care 50% of the time.   Status On-going   OT SHORT TERM GOAL #4   Title Pt will need no more than min vc's to compensate for L neglect during functional mobility tasks   Status On-going   OT SHORT TERM GOAL #5   Title Pt will demonstrate ability to attend to familiar functional task for 15 minute with no more than one vc.   Status On-going   OT SHORT TERM GOAL #6   Title Pt will require no more than min assist for toilet transfers   Status On-going           OT Long Term Goals - 01/10/15 1250    [place] #1   Title Pt and wife  will be mod I with upgraded HEP - 02/15/2015. Goal date adjusted as pt will not return to clinic prior to 12/21/2014   Status On-going   OT LONG TERM GOAL #2   Title Pt will demonstrate ability to use LUE as gross assist 100% of the time in basic self care tasks   Status On-going   OT LONG TERM GOAL #3   Title Pt will demonstrate ability to hold cylindrical object in L hand.    Status On-going   OT LONG TERM GOAL #4   Title Pt will verbalize understanding of AE for buttoning, zipping and cutting   Status On-going   OT LONG TERM GOAL #5   Title Pt will be supervision for toilet transfers   Status On-going   OT LONG TERM GOAL #6   Title Pt will be min a for shower transfers   Status On-going   OT LONG TERM GOAL #7   Title Pt will  be supervision for simple hot meal prep   Status On-going               Plan - 01/10/15 1250    Clinical Impression Statement Pt progressing nicely with gaining isolated movement in LUE. Pt needs mod vc's and feedback to regulate grading and intensity of movement to avoid increasing tone with exertion. Pt with possible DVT in LLE and will see MD today at 2 pm to rule out.    Rehab Potential Good   Clinical Impairments Affecting Rehab Potential Pt will benefit from skilled OT to address deficits outlined in eval to maximize independence.   OT Frequency 2x / week   OT Duration 8 weeks   OT Treatment/Interventions Self-care/ADL training;Cryotherapy;Moist Heat;Electrical Stimulation;Fluidtherapy;DME and/or AE instruction;Energy conservation;Neuromuscular education;Therapeutic exercise;Functional Mobility Training;Manual Therapy;Passive range of motion;Splinting;Therapeutic activities;Balance training;Patient/family education;Cognitive remediation/compensation   Plan neuro re ed to LUE in supine and in low reach, functional mobility   OT Home Exercise Plan issued inital HEP-12/27/14, cane shoulder flexion 01/03/15   Consulted and Agree with Plan of Care  Patient;Family member/caregiver   Family Member Consulted wife        Problem List Patient Active Problem List   Diagnosis Date Noted  . Spastic hemiparesis affecting nondominant side 11/08/2014  . Hypothyroidism 10/27/2014  . Left-sided neglect 09/23/2014  .  Acute ischemic right MCA stroke 09/16/2014  . Left hemiparesis   . CVA (cerebral vascular accident)   . Right carotid artery occlusion   . Acute right MCA stroke   . Essential hypertension 09/12/2014  . CVA (cerebral infarction) 09/12/2014  . Type 2 diabetes mellitus with peripheral neuropathy 09/12/2014    Quay Burow, OTR/L 01/10/2015, 12:54 PM  [facility] [address] [place], [place], [address] Phone: [phone]   Fax:  [phone]

## 2015-01-10 NOTE — Therapy (Signed)
[facility] [address] [place], [place], [address] Phone: [phone]   Fax:  [phone]  Physical Therapy Treatment  Patient Details  Name: Brian Hall MRN: [id number] Date of Birth: 1955/10/30 Referring Provider:  Claretta Fraise, MD  Encounter Date: 01/10/2015    Past Medical History  Diagnosis Date  . Hypertension   . Diabetes mellitus   . Obesity   . CHF (congestive heart failure)   . SOB (shortness of breath)   . Hyperkalemia   . Renal insufficiency   . Cardiogenic shock   . Cardiac LV ejection fraction 10-20%   . Hyperlipemia   . Hypothyroidism   . Throat cancer 2011    s/p neck dissection, radiation, chemo    Past Surgical History  Procedure Laterality Date  . Cardiac catheterization    . Neck dissection Right 2011    s/p resection of throat cancer with multiple nodes removed    There were no vitals filed for this visit.  Visit Diagnosis:  Abnormality of gait      Subjective Assessment - 01/10/15 1237    Subjective Was reported by OT that pt had left foot/leg swelling and is being seen today at 2pm by MD to rule out DVT. Will cancell PT today and follow up after pt see's MD.           PT Short Term Goals - 12/14/14 1306    PT SHORT TERM GOAL #1   Title Pt will perform HEP with supervision of family, for improved strength and balance. (Target 01/10/15)   Time 4   Period Weeks   Status New   PT SHORT TERM GOAL #2   Title Pt will improve Timed UP and Go score to less than or equal to 35 seconds for decreased fall risk.   Baseline eval-46.78 sec   Time 4   Period Weeks   Status New   PT SHORT TERM GOAL #3   Title Berg Balance score to be assessed, with pt scoring at least 5 additional points to decrease fall risk.   Time 4   Period Weeks   Status New   PT SHORT TERM GOAL #4   Title Pt will perform at least 6 of 10 reps of sit<>stand transfers no UE support, with equal  weightbearing through bilateral lower extremities, independently.   Time 4   Period Weeks   Status New           PT Long Term Goals - 12/14/14 1310    PT LONG TERM GOAL #1   Title Pt will verbalize understanding of fall prevention within home environment. Target 02/10/15   Time 8   Period Weeks   Status New   PT LONG TERM GOAL #2   Title Pt will improve TUG to less than or equal to 25 seconds for decreased fall risk.   Time 8   Period Weeks   Status New   PT LONG TERM GOAL #3   Title Pt will improve gait velocity to at least 1 ft/sec for improved gait efficiency and safety.   Time 8   Period Weeks   Status New   PT LONG TERM GOAL #4   Title Pt will ambulate at least 50-100 ft using hemiwalker, household distances, with supervision.   Time 8   Status New      Problem List Patient Active Problem List   Diagnosis Date Noted  . Spastic hemiparesis affecting nondominant side 11/08/2014  .  Hypothyroidism 10/27/2014  . Left-sided neglect 09/23/2014  . Acute ischemic right MCA stroke 09/16/2014  . Left hemiparesis   . CVA (cerebral vascular accident)   . Right carotid artery occlusion   . Acute right MCA stroke   . Essential hypertension 09/12/2014  . CVA (cerebral infarction) 09/12/2014  . Type 2 diabetes mellitus with peripheral neuropathy 09/12/2014    [name] 01/10/2015, 12:39 PM  [name], PTA, [facility] [address] [place], [place] [address] [phone] 01/10/2015, 12:39 PM

## 2015-01-10 NOTE — Progress Notes (Signed)
VASCULAR LAB PRELIMINARY  PRELIMINARY  PRELIMINARY  PRELIMINARY  RLEV completed.    Preliminary report:  Negative DVT right lower extremity.  Positive Baker's Cyst right popliteal fossa.    August Albino, RVT 01/10/2015, 2:25 PM

## 2015-01-11 NOTE — Telephone Encounter (Signed)
Request received and processed by [facility] on 01/05/15.  Lmom for patient.

## 2015-01-12 ENCOUNTER — Ambulatory Visit: Payer: 59 | Admitting: Physical Therapy

## 2015-01-12 ENCOUNTER — Ambulatory Visit: Payer: 59 | Admitting: Occupational Therapy

## 2015-01-12 ENCOUNTER — Encounter: Payer: Self-pay | Admitting: Neurology

## 2015-01-12 ENCOUNTER — Encounter: Payer: Self-pay | Admitting: Occupational Therapy

## 2015-01-12 ENCOUNTER — Ambulatory Visit (INDEPENDENT_AMBULATORY_CARE_PROVIDER_SITE_OTHER): Payer: 59 | Admitting: Neurology

## 2015-01-12 VITALS — BP 124/74 | HR 54 | Ht 71.0 in | Wt 234.2 lb

## 2015-01-12 DIAGNOSIS — Z7409 Other reduced mobility: Secondary | ICD-10-CM

## 2015-01-12 DIAGNOSIS — I6931 Cognitive deficits following cerebral infarction: Secondary | ICD-10-CM | POA: Diagnosis not present

## 2015-01-12 DIAGNOSIS — G8194 Hemiplegia, unspecified affecting left nondominant side: Secondary | ICD-10-CM

## 2015-01-12 DIAGNOSIS — R201 Hypoesthesia of skin: Secondary | ICD-10-CM

## 2015-01-12 DIAGNOSIS — G8110 Spastic hemiplegia affecting unspecified side: Secondary | ICD-10-CM | POA: Diagnosis not present

## 2015-01-12 DIAGNOSIS — R414 Neurologic neglect syndrome: Secondary | ICD-10-CM

## 2015-01-12 DIAGNOSIS — R269 Unspecified abnormalities of gait and mobility: Secondary | ICD-10-CM

## 2015-01-12 DIAGNOSIS — R4189 Other symptoms and signs involving cognitive functions and awareness: Secondary | ICD-10-CM

## 2015-01-12 NOTE — Progress Notes (Signed)
[facility] [address] [place]. [place] [address] [phone]       OFFICE FOLLOW-UP NOTE  [name] Date of Birth:  04/16/1956 Medical Record Number:  [id number]   HPI: 8 year Caucasian male seen today for the first office follow-up visit following hospital admission for stroke on 09/11/13. He developed sudden onset of slurred speech and left-sided weakness when he stood up and tone. He actually had fallen the night before as well and did not choose to come to the hospital. He presented beyond time window for thrombolysis. He had significant left hemiplegia on admission. CT scan of the head showed right MCA infarct with cytotoxic edema and subsequently MRI scan was obtained which showed even increase edema in the right insula as well as frontal lobe. There is an old infarct noted in the left occipital lobe. MRA of the brain was motion degraded but showed occluded right internal carotid artery in the neck with some distal reconstitution at the terminus via flow from circle of Willis. Transthoracic echo showed a decreased ejection fraction of 30-35% but no definite clot. There was diffuse hypokinesis. Carotid Dopplers confirmed right extracranial ICA occlusion and no significant stenosis on the left. LDL cholesterol was 88 he was on Pravachol which was resumed. Patient was started on aspirin and Plavix for 3 months. He has been discharged from rehabilitation and is being home for last 3 months. He has finished home therapy and is currently participating in outpatient physical and occupational therapy. He is able to walk with a cane with a therapist But at home he has to walk with a belt and a walker and the wife standing. His blood pressure is quite well controlled and today it is 1-4/78. His fasting sugars have also been well controlled. He is tolerating aspirin and Plavix with only minor bruising but no bleeding episodes. He has remote history of thyroid cancer and had  received neck radiation.  ROS:   14 system review of systems is positive for  Weakness, gait difficulty, joint pain and swelling, blurred vision, activity change in all the systems negative  PMH:  Past Medical History  Diagnosis Date  . Hypertension   . Diabetes mellitus   . Obesity   . CHF (congestive heart failure)   . SOB (shortness of breath)   . Hyperkalemia   . Renal insufficiency   . Cardiogenic shock   . Cardiac LV ejection fraction 10-20%   . Hyperlipemia   . Hypothyroidism   . Throat cancer 2011    s/p neck dissection, radiation, chemo  . Stroke   . Blurred vision   . Joint pain   . Joint swelling     Social History:  History   Social History  . Marital Status: Legally Separated    Spouse Name: N/A  . Number of Children: 1  . Years of Education: 11   Occupational History  . truck driver, retired    Social History Main Topics  . Smoking status: Never Smoker   . Smokeless tobacco: Not on file  . Alcohol Use: No  . Drug Use: No  . Sexual Activity: Not on file   Other Topics Concern  . Not on file   Social History Narrative   Married, 1 daughter   Right handed   Caffeine use - rare tea       Medications:   Current Outpatient Prescriptions on File Prior to Visit  Medication Sig Dispense Refill  . aspirin 81  MG tablet Take 1 tablet (81 mg total) by mouth daily. 30 tablet 11  . carvedilol (COREG) 12.5 MG tablet Take 1 tablet (12.5 mg total) by mouth 2 (two) times daily. 60 tablet 1  . cholecalciferol (VITAMIN D) 400 UNITS TABS tablet Take 400 Units by mouth daily.    . clopidogrel (PLAVIX) 75 MG tablet Take 1 tablet (75 mg total) by mouth daily. 30 tablet 2  . glimepiride (AMARYL) 2 MG tablet Take 1 tablet (2 mg total) by mouth daily with breakfast. 30 tablet 2  . levothyroxine (SYNTHROID, LEVOTHROID) 88 MCG tablet Take 1 tablet (88 mcg total) by mouth daily before breakfast. 30 tablet 3  . lisinopril (PRINIVIL,ZESTRIL) 40 MG tablet take 1 tablet  by mouth once daily 30 tablet 5  . Multiple Vitamins-Minerals (MULTIVITAMIN WITH MINERALS) tablet Take 1 tablet by mouth daily.    . nebivolol (BYSTOLIC) 10 MG tablet Take 1 tablet (10 mg total) by mouth daily. 30 tablet 5  . omeprazole (PRILOSEC) 40 MG capsule Take 1 capsule (40 mg total) by mouth daily. 30 capsule 1  . ONE TOUCH ULTRA TEST test strip   1  . pravastatin (PRAVACHOL) 40 MG tablet take 1 tablet by mouth once daily 30 tablet 5  . tiZANidine (ZANAFLEX) 2 MG tablet Take 1 tablet (2 mg total) by mouth 3 (three) times daily. 90 tablet 1  . traMADol (ULTRAM) 50 MG tablet Take 1 tablet (50 mg total) by mouth every 6 (six) hours as needed for moderate pain. 60 tablet 0   No current facility-administered medications on file prior to visit.    Allergies:  No Known Allergies  Physical Exam General: Obese middle-age Caucasian male, seated, in no evident distress Head: head normocephalic and atraumatic.  Neck: supple with no carotid or supraclavicular bruits Cardiovascular: regular rate and rhythm, no murmurs Musculoskeletal: no deformity Skin:  no rash/petichiae Vascular:  Normal pulses all extremities Filed Vitals:   01/12/15 1258  BP: 124/74  Pulse: 54   Neurologic Exam Mental Status: Awake and fully alert. Oriented to place and time. Recent and remote memory intact. Attention span, concentration and fund of knowledge appropriate. Mood and affect appropriate.  Cranial Nerves: Fundoscopic exam reveals sharp disc margins. Pupils equal, briskly reactive to light. Extraocular movements full without nystagmus. Visual fields full to confrontation. Hearing intact. Facial sensation intact. Mild left lower facial weakness. Tongue, palate moves normally and symmetrically.  Motor: Left spastic hemiparesis with 3/5 strength at the left shoulder and elbow but 0/5 strength at the left breast and hand muscles. Left lower extremity strength is 4/5 with left foot drop and ankle dorsiflexor strength  is 0/5. Tone is increased on the left. Normal strength on the right side. Sensory.: intact to touch ,pinprick .position and vibratory sensation.  Coordination: Rapid alternating movements normal in right sided extremities. Finger-to-nose and heel-to-shin performed accurately on right side only. Gait and Station: Deferred as patient is in a wheelchair and requires one-person assist per history.  Reflexes: 1+ and asymmetric and brisker on the left side. Toes downgoing.   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    PLAN: I had a long d/w patient about his recent stroke, risk for recurrent stroke/TIAs, personally independently reviewed imaging studies and stroke  evaluation results and answered questions.Continue Plavix 75 mg daily and discontinue aspirin 81 mg daily as it has been more than 3 months since his stroke  for secondary stroke prevention and maintain strict control of hypertension with blood pressure goal below 130/90, diabetes with hemoglobin A1c goal below 6.5% and lipids with LDL cholesterol goal below 70 mg/dL. continue ongoing outpatient physical and occupational therapy. I also advised the patient to eat a healthy diet with plenty of whole grains, cereals, fruits and vegetables, exercise regularly and maintain ideal body weight Followup in the future with Brian Givens, NP in 6 months or call earlier if necessary Antony Contras, MD  Note: This document was prepared with digital dictation and possible smart phrase technology. Any transcriptional errors that result from this process are unintentional

## 2015-01-12 NOTE — Patient Instructions (Signed)
I had a long d/w patient about his recent stroke, risk for recurrent stroke/TIAs, personally independently reviewed imaging studies and stroke evaluation results and answered questions.Continue Plavix 75 mg daily and discontinue aspirin 81 mg daily as it has been more than 3 months since his stroke  for secondary stroke prevention and maintain strict control of hypertension with blood pressure goal below 130/90, diabetes with hemoglobin A1c goal below 6.5% and lipids with LDL cholesterol goal below 70 mg/dL. continue ongoing outpatient physical and occupational therapy. I also advised the patient to eat a healthy diet with plenty of whole grains, cereals, fruits and vegetables, exercise regularly and maintain ideal body weight Followup in the future with Ward Givens, NP in 6 months or call earlier if necessary Stroke Prevention Some medical conditions and behaviors are associated with an increased chance of having a stroke. You may prevent a stroke by making healthy choices and managing medical conditions. HOW CAN I REDUCE MY RISK OF HAVING A STROKE?   Stay physically active. Get at least 30 minutes of activity on most or all days.  Do not smoke. It may also be helpful to avoid exposure to secondhand smoke.  Limit alcohol use. Moderate alcohol use is considered to be:  No more than 2 drinks per day for men.  No more than 1 drink per day for nonpregnant women.  Eat healthy foods. This involves:  Eating 5 or more servings of fruits and vegetables a day.  Making dietary changes that address high blood pressure (hypertension), high cholesterol, diabetes, or obesity.  Manage your cholesterol levels.  Making food choices that are high in fiber and low in saturated fat, trans fat, and cholesterol may control cholesterol levels.  Take any prescribed medicines to control cholesterol as directed by your health care provider.  Manage your diabetes.  Controlling your carbohydrate and sugar intake  is recommended to manage diabetes.  Take any prescribed medicines to control diabetes as directed by your health care provider.  Control your hypertension.  Making food choices that are low in salt (sodium), saturated fat, trans fat, and cholesterol is recommended to manage hypertension.  Take any prescribed medicines to control hypertension as directed by your health care provider.  Maintain a healthy weight.  Reducing calorie intake and making food choices that are low in sodium, saturated fat, trans fat, and cholesterol are recommended to manage weight.  Stop drug abuse.  Avoid taking birth control pills.  Talk to your health care provider about the risks of taking birth control pills if you are over 63 years old, smoke, get migraines, or have ever had a blood clot.  Get evaluated for sleep disorders (sleep apnea).  Talk to your health care provider about getting a sleep evaluation if you snore a lot or have excessive sleepiness.  Take medicines only as directed by your health care provider.  For some people, aspirin or blood thinners (anticoagulants) are helpful in reducing the risk of forming abnormal blood clots that can lead to stroke. If you have the irregular heart rhythm of atrial fibrillation, you should be on a blood thinner unless there is a good reason you cannot take them.  Understand all your medicine instructions.  Make sure that other conditions (such as anemia or atherosclerosis) are addressed. SEEK IMMEDIATE MEDICAL CARE IF:   You have sudden weakness or numbness of the face, arm, or leg, especially on one side of the body.  Your face or eyelid droops to one side.  You have  sudden confusion.  You have trouble speaking (aphasia) or understanding.  You have sudden trouble seeing in one or both eyes.  You have sudden trouble walking.  You have dizziness.  You have a loss of balance or coordination.  You have a sudden, severe headache with no known  cause.  You have new chest pain or an irregular heartbeat. Any of these symptoms may represent a serious problem that is an emergency. Do not wait to see if the symptoms will go away. Get medical help at once. Call your local emergency services (911 in [place] Do not drive yourself to the hospital. Document Released: 09/26/2004 Document Revised: 01/03/2014 Document Reviewed: 02/19/2013 [facility] Patient Information 2015 [place], [place]. This information is not intended to replace advice given to you by your health care provider. Make sure you discuss any questions you have with your health care provider.

## 2015-01-12 NOTE — Therapy (Signed)
[facility] [address] [place], [place], [address] Phone: [phone]   Fax:  [phone]  Occupational Therapy Treatment  Patient Details  Name: Brian Hall MRN: [id number] Date of Birth: 1956/03/12 Referring Provider:  Claretta Fraise, MD  Encounter Date: 01/12/2015      OT End of Session - 01/12/15 1211    Visit Number 7   Number of Visits 16   Date for OT Re-Evaluation 02/15/15   Authorization Type wife states pt is on cobra   Authorization Time Period pt will not return to clinic until 12/21/14 in order to allow time to clarify insurance.   OT Start Time 1103   OT Stop Time 1146   OT Time Calculation (min) 43 min   Activity Tolerance Patient tolerated treatment well      Past Medical History  Diagnosis Date  . Hypertension   . Diabetes mellitus   . Obesity   . CHF (congestive heart failure)   . SOB (shortness of breath)   . Hyperkalemia   . Renal insufficiency   . Cardiogenic shock   . Cardiac LV ejection fraction 10-20%   . Hyperlipemia   . Hypothyroidism   . Throat cancer 2011    s/p neck dissection, radiation, chemo    Past Surgical History  Procedure Laterality Date  . Cardiac catheterization    . Neck dissection Right 2011    s/p resection of throat cancer with multiple nodes removed    There were no vitals filed for this visit.  Visit Diagnosis:  Hemiplegia affecting left nondominant side  Impaired functional mobility and activity tolerance  Left-sided neglect  Impaired cognition  Impaired sensation      Subjective Assessment - 01/12/15 1107    Subjective  I don't a DVT - my ankle still hurts but not as bad. My arm feels great - no pain.   Pertinent History see epic snapshot   Patient Stated Goals Get back to doing some of the things I used to do.   Currently in Pain? Yes   Pain Score 4    Pain Location Ankle   Pain Orientation Left   Pain Descriptors / Indicators  Aching   Pain Type Acute pain   Pain Onset In the past 7 days   Pain Frequency Intermittent   Aggravating Factors  trying to walk   Pain Relieving Factors standing still on it, supporting it so it isn't dangling                      OT Treatments/Exercises (OP) - 01/12/15 0001    Neurological Re-education Exercises   Other Exercises 1 Neuro re ed in supine as well as sitting to address RUE movement wth emphasis on decreasing tone, grading movement, normalized morvement patterns avoiding substitution with goal for more normalized low bilateral reach. Pt requires significant feedback regarding grading of activity.  Pt needs min faciliation in supine and mod - max facilitation in sitting however does improve with repetition (due to apraxia, poor ability to grade, perceptual deficits, and cognitive deficits).                    OT Short Term Goals - 01/12/15 1209    OT SHORT TERM GOAL #1   Title Pt and wife will be mod I with HEP - 01/18/2015.  Goal dates adjusted as pt will not return to clinic prior to 12/21/14   Status On-going  OT SHORT TERM GOAL #2   Title Pt will require min a for LB dressing   Status On-going   OT SHORT TERM GOAL #3   Title Pt will demonstrate ability to use LUE as gross assist during basic self care 50% of the time.   Status On-going   OT SHORT TERM GOAL #4   Title Pt will need no more than min vc's to compensate for L neglect during functional mobility tasks   Status On-going   OT SHORT TERM GOAL #5   Title Pt will demonstrate ability to attend to familiar functional task for 15 minute with no more than one vc.   Status On-going   OT SHORT TERM GOAL #6   Title Pt will require no more than min assist for toilet transfers   Status On-going           OT Long Term Goals - 01/12/15 [place] #1   Title Pt and wife will be mod I with upgraded HEP - 02/15/2015. Goal date adjusted as pt will not return to clinic prior to  12/21/2014   Status On-going   OT LONG TERM GOAL #2   Title Pt will demonstrate ability to use LUE as gross assist 100% of the time in basic self care tasks   Status On-going   OT LONG TERM GOAL #3   Title Pt will demonstrate ability to hold cylindrical object in L hand.    Status On-going   OT LONG TERM GOAL #4   Title Pt will verbalize understanding of AE for buttoning, zipping and cutting   Status On-going   OT LONG TERM GOAL #5   Title Pt will be supervision for toilet transfers   Status On-going   OT LONG TERM GOAL #6   Title Pt will be min a for shower transfers   Status On-going   OT LONG TERM GOAL #7   Title Pt will  be supervision for simple hot meal prep   Status On-going               Plan - 01/12/15 1209    Clinical Impression Statement Pt making good progress toward goals. Pt does not have a DVT however ankle still painful therefore worked in supine and sitting today.   Rehab Potential Good   Clinical Impairments Affecting Rehab Potential Pt will benefit from skilled OT to address deficits outlined in eval to maximize independence.   OT Frequency 2x / week   OT Duration 8 weeks   OT Treatment/Interventions Self-care/ADL training;Cryotherapy;Moist Heat;Electrical Stimulation;Fluidtherapy;DME and/or AE instruction;Energy conservation;Neuromuscular education;Therapeutic exercise;Functional Mobility Training;Manual Therapy;Passive range of motion;Splinting;Therapeutic activities;Balance training;Patient/family education;Cognitive remediation/compensation   Plan cont neuro re ed with emphasis on trunk control, RUE and  functional mobility   OT Home Exercise Plan issued inital HEP-12/27/14, cane shoulder flexion 01/03/15   Consulted and Agree with Plan of Care Patient;Family member/caregiver   Family Member Consulted wife        Problem List Patient Active Problem List   Diagnosis Date Noted  . Spastic hemiparesis affecting nondominant side 11/08/2014  .  Hypothyroidism 10/27/2014  . Left-sided neglect 09/23/2014  . Acute ischemic right MCA stroke 09/16/2014  . Left hemiparesis   . CVA (cerebral vascular accident)   . Right carotid artery occlusion   . Acute right MCA stroke   . Essential hypertension 09/12/2014  . CVA (cerebral infarction) 09/12/2014  . Type 2 diabetes mellitus with peripheral neuropathy 09/12/2014  Quay Burow, OTR/L 01/12/2015, 12:12 PM  [facility] [address] [place], [place], [address] Phone: [phone]   Fax:  [phone]

## 2015-01-13 NOTE — Therapy (Signed)
[facility] [address] [place], [place], [address] Phone: [phone]   Fax:  [phone]  Physical Therapy Treatment  Patient Details  Name: Brian Hall MRN: [id number] Date of Birth: [birth year] Referring Provider:  Claretta Fraise, MD  Encounter Date: 01/12/2015      PT End of Session - 01/13/15 0911    Visit Number 6   Number of Visits 17   Date for PT Re-Evaluation 02/10/15   PT Start Time 1019   PT Stop Time 1100   PT Time Calculation (min) 41 min   Activity Tolerance Patient tolerated treatment well   Behavior During Therapy [facility] for tasks assessed/performed      Past Medical History  Diagnosis Date  . Hypertension   . Diabetes mellitus   . Obesity   . CHF (congestive heart failure)   . SOB (shortness of breath)   . Hyperkalemia   . Renal insufficiency   . Cardiogenic shock   . Cardiac LV ejection fraction 10-20%   . Hyperlipemia   . Hypothyroidism   . Throat cancer 2011    s/p neck dissection, radiation, chemo  . Stroke   . Blurred vision   . Joint pain   . Joint swelling     Past Surgical History  Procedure Laterality Date  . Cardiac catheterization    . Neck dissection Right 2011    s/p resection of throat cancer with multiple nodes removed    There were no vitals filed for this visit.  Visit Diagnosis:  Hemiplegia affecting left nondominant side  Abnormality of gait      Subjective Assessment - 01/12/15 1022    Subjective No blood clot noted per MD report; Still swollen and still painful.  Now pt is c/o pain when weightbearing.   Patient is accompained by: Family member  wife   Currently in Pain? Yes   Pain Score 6    Pain Location Ankle   Pain Orientation Left   Pain Descriptors / Indicators --  stinging, very uncomfortable   Pain Type Chronic pain   Pain Onset More than a month ago   Pain Frequency Intermittent   Aggravating Factors  weightbearing   Pain  Relieving Factors ice/elevating        Therapeutic Exercise: -Seated exercises for ROM to L ankle:  PT provides assistance for seated hip and knee flexion with feet on bolster, to prevent L foot from having excessive inversion and supination.  Seated heelslides on L using sliding board for reducing friction for increase ease of ROM.  PT provides verbal and tactile cues for improved ROM/stretch, x 10 reps with 4 second hold and cues to push L heel into floor.  -Supine exercises:  With lower extremities over red therapy ball and therapist's assistance to keep LLE in place, bilateral hip and knee flexion x 10 reps.  PT assists pt performing hooklying L hip flexion, x 15 reps.  Pt reports and demonstrates uncontrolled descent of LLE onto mat; with verbal cues to bring heel down to the mat, pt is able to have improved control of LLE.  Bridging x 10 reps, with assistance for neutral foot placement of LLE and cues for breathing during bridging.  Neuro Re-education: -PT performs PNF diagonal patterns for LLE in sidelying and in hooklying supine position to improve L hip, knee flexion, and ankle dorsiflexion.  Self Care: Had in-depth discussion with patient and with wife regarding pt's technique of performing exercises as well as  positioning of LLE at rest. Pt and wife report that he sits reclined in recliner and often has L foot positioned in plantarflexed, inverted, supinated position for prolonged periods, which may be some of the cause of L ankle pain (test for DVT was negative).  Discussed and demonstrated proper positioning of L foot, in sitting, reclined and exercising positions.                          PT Education - 01/13/15 0910    Education provided Yes   Education Details proper technique for ankle stretching exercises to keep ankle in neutral position; positioning in neutral ankle position as much as possible in sitting and reclined positions   Person(s) Educated  Patient;Spouse   Methods Demonstration;Explanation   Comprehension Verbalized understanding;Returned demonstration;Verbal cues required          PT Short Term Goals - 12/14/14 1306    PT SHORT TERM GOAL #1   Title Pt will perform HEP with supervision of family, for improved strength and balance. (Target 01/10/15)   Time 4   Period Weeks   Status New   PT SHORT TERM GOAL #2   Title Pt will improve Timed UP and Go score to less than or equal to 35 seconds for decreased fall risk.   Baseline eval-46.78 sec   Time 4   Period Weeks   Status New   PT SHORT TERM GOAL #3   Title Berg Balance score to be assessed, with pt scoring at least 5 additional points to decrease fall risk.   Time 4   Period Weeks   Status New   PT SHORT TERM GOAL #4   Title Pt will perform at least 6 of 10 reps of sit<>stand transfers no UE support, with equal weightbearing through bilateral lower extremities, independently.   Time 4   Period Weeks   Status New           PT Long Term Goals - 12/14/14 1310    PT LONG TERM GOAL #1   Title Pt will verbalize understanding of fall prevention within home environment. Target 02/10/15   Time 8   Period Weeks   Status New   PT LONG TERM GOAL #2   Title Pt will improve TUG to less than or equal to 25 seconds for decreased fall risk.   Time 8   Period Weeks   Status New   PT LONG TERM GOAL #3   Title Pt will improve gait velocity to at least 1 ft/sec for improved gait efficiency and safety.   Time 8   Period Weeks   Status New   PT LONG TERM GOAL #4   Title Pt will ambulate at least 50-100 ft using hemiwalker, household distances, with supervision.   Time 8   Status New               Plan - 01/13/15 0912    Clinical Impression Statement Pt continues to need verbal cues for increased awareness of LLE positioning during rest and exercises to avoid excessive inversion and plantarflexed positioning.  Pt will continue to benefit from further skilled PT  to address strength, flexibility, balance and gait activities.   Pt will benefit from skilled therapeutic intervention in order to improve on the following deficits Abnormal gait;Decreased activity tolerance;Decreased balance;Decreased mobility;Decreased range of motion;Decreased safety awareness;Difficulty walking;Decreased strength;Impaired tone   Rehab Potential Good   PT Frequency 2x / week  PT Duration 8 weeks  wk 3 of 8   PT Treatment/Interventions ADLs/Self Care Home Management;Therapeutic activities;Functional mobility training;Stair training;Gait training;Therapeutic exercise;Balance training;Neuromuscular re-education;Patient/family education   PT Next Visit Plan Check short term goals; would e-stim or bioness be appropriate?  if so, need to update POC; continue LLE weightbearing, gait and stretching   Consulted and Agree with Plan of Care Patient;Family member/caregiver   Family Member Consulted wife        Problem List Patient Active Problem List   Diagnosis Date Noted  . Spastic hemiparesis affecting nondominant side 11/08/2014  . Hypothyroidism 10/27/2014  . Left-sided neglect 09/23/2014  . Acute ischemic right MCA stroke 09/16/2014  . Left hemiparesis   . CVA (cerebral vascular accident)   . Right carotid artery occlusion   . Acute right MCA stroke   . Essential hypertension 09/12/2014  . CVA (cerebral infarction) 09/12/2014  . Type 2 diabetes mellitus with peripheral neuropathy 09/12/2014    Yu Cragun W. 01/13/2015, 9:19 AM  Frazier Butt., PT  [facility] [address] [place], [place], [address] Phone: [phone]   Fax:  [phone]

## 2015-01-16 ENCOUNTER — Ambulatory Visit (HOSPITAL_COMMUNITY): Payer: 59

## 2015-01-17 ENCOUNTER — Ambulatory Visit: Payer: 59

## 2015-01-17 ENCOUNTER — Ambulatory Visit: Payer: 59 | Admitting: Occupational Therapy

## 2015-01-17 ENCOUNTER — Encounter: Payer: Self-pay | Admitting: Occupational Therapy

## 2015-01-17 DIAGNOSIS — G8194 Hemiplegia, unspecified affecting left nondominant side: Secondary | ICD-10-CM

## 2015-01-17 DIAGNOSIS — Z7409 Other reduced mobility: Secondary | ICD-10-CM

## 2015-01-17 DIAGNOSIS — R4189 Other symptoms and signs involving cognitive functions and awareness: Secondary | ICD-10-CM

## 2015-01-17 DIAGNOSIS — R269 Unspecified abnormalities of gait and mobility: Secondary | ICD-10-CM

## 2015-01-17 DIAGNOSIS — R201 Hypoesthesia of skin: Secondary | ICD-10-CM

## 2015-01-17 DIAGNOSIS — R414 Neurologic neglect syndrome: Secondary | ICD-10-CM

## 2015-01-17 DIAGNOSIS — I6931 Cognitive deficits following cerebral infarction: Secondary | ICD-10-CM | POA: Diagnosis not present

## 2015-01-17 NOTE — Patient Instructions (Signed)
Home program for left arm: In addition to cane exercises. Do 2 times per day.  1. Sit on firm surface. Hold left arm with your right hand. Slowly reach toward the floor with both hands. HOLD FOR A SLOW COUNT OF THREE then sit back up. Do 10 times, rest then do 10 more.   2.  Towel exercises: Sit at table with left hand on the table.  Put a towel under it. Put a target out. Start with hand next to belly, then straighten elbow and reach for the target. Keep your elbow on the table and slide it!!  Don't hike your shoulder  3. Towel exercise;  Sit at table with left hand on the table. Put a towel under it. Put a target out in front. Start with both arms face down on the table. Reach for the target in front of you.   Keep your elbow on the table and slide. Don't hike your shoulder!  Do 10 ,rest then do 10 more.

## 2015-01-17 NOTE — Therapy (Signed)
[facility] [address] [place], [place], [address] Phone: [phone]   Fax:  [phone]  Occupational Therapy Treatment  Patient Details  Name: Brian Hall MRN: [id number] Date of Birth: 21-May-1956 Referring Provider:  Claretta Fraise, MD  Encounter Date: 01/17/2015      OT End of Session - 01/17/15 1708    Visit Number 8   Number of Visits 16   Date for OT Re-Evaluation 02/15/15   Authorization Type wife states pt is on [employer]   Authorization Time Period pt will not return to clinic until 12/21/14 in order to allow time to clarify insurance.   OT Start Time 1531   OT Stop Time 1616   OT Time Calculation (min) 45 min   Activity Tolerance Patient tolerated treatment well      Past Medical History  Diagnosis Date  . Hypertension   . Diabetes mellitus   . Obesity   . CHF (congestive heart failure)   . SOB (shortness of breath)   . Hyperkalemia   . Renal insufficiency   . Cardiogenic shock   . Cardiac LV ejection fraction 10-20%   . Hyperlipemia   . Hypothyroidism   . Throat cancer 2011    s/p neck dissection, radiation, chemo  . Stroke   . Blurred vision   . Joint pain   . Joint swelling     Past Surgical History  Procedure Laterality Date  . Cardiac catheterization    . Neck dissection Right 2011    s/p resection of throat cancer with multiple nodes removed    There were no vitals filed for this visit.  Visit Diagnosis:  Hemiplegia affecting left nondominant side  Impaired functional mobility and activity tolerance  Left-sided neglect  Impaired cognition      Subjective Assessment - 01/17/15 1532    Subjective  I really want to get bettter and do more things on my own   Pertinent History see epic snapshot   Patient Stated Goals Get back to doing some of the things I used to do.   Currently in Pain? Yes   Pain Score 2    Pain Location Wrist   Pain Orientation Left   Pain  Descriptors / Indicators Discomfort   Pain Type Chronic pain   Pain Onset 1 to 4 weeks ago   Pain Frequency Intermittent   Aggravating Factors  letting it hang   Pain Relieving Factors positioning                       OT Treatments/Exercises (OP) - 01/17/15 0001    ADLs   Toileting Practiced toilet transfers with quad cane. Pt is unable to fit wheelchair into bathroom at home however pt and wife are in the community therefore practiced for wheelchair accessible bathrooms. Pt initally required mod vc's for foot placement, safety and impulsivity. Pt also required min a for balance for transfers. With repetitive practice pt ony required min vc's and min steadying assist. Pt and wife were able to safely return demonstation toilet transfer and wife stated she felt comfortable with transfer.   Neurological Re-education Exercises   Other Exercises 1 Provided slide exercises for pt as part of HEP. Wife able to monitor shoulder hiking and abnormal posturing. Also working grading force behind movements. See pt instruction for details.                OT Education - 01/17/15 1706  Education provided Yes   Education Details HEP for LUE upgraded   Person(s) Educated Patient;Spouse   Methods Explanation;Demonstration;Tactile cues;Verbal cues;Handout   Comprehension Verbalized understanding;Returned demonstration          OT Short Term Goals - 01/17/15 1706    OT SHORT TERM GOAL #1   Title Pt and wife will be mod I with HEP - 01/18/2015.  Goal dates adjusted as pt will not return to clinic prior to 12/21/14   Status Achieved   OT SHORT TERM GOAL #2   Title Pt will require min a for LB dressing   Status Achieved   OT SHORT TERM GOAL #3   Title Pt will demonstrate ability to use LUE as gross assist during basic self care 50% of the time.   Status On-going   OT SHORT TERM GOAL #4   Title Pt will need no more than min vc's to compensate for L neglect during functional  mobility tasks   Status Achieved   OT SHORT TERM GOAL #5   Title Pt will demonstrate ability to attend to familiar functional task for 15 minute with no more than one vc.   Status Achieved   OT SHORT TERM GOAL #6   Title Pt will require no more than min assist for toilet transfers   Status Achieved           OT Long Term Goals - 01/17/15 [place] #1   Title Pt and wife will be mod I with upgraded HEP - 02/15/2015. Goal date adjusted as pt will not return to clinic prior to 12/21/2014   Status On-going   OT LONG TERM GOAL #2   Title Pt will demonstrate ability to use LUE as gross assist 100% of the time in basic self care tasks   Status On-going   OT LONG TERM GOAL #3   Title Pt will demonstrate ability to hold cylindrical object in L hand.    Status On-going   OT LONG TERM GOAL #4   Title Pt will verbalize understanding of AE for buttoning, zipping and cutting   Status On-going   OT LONG TERM GOAL #5   Title Pt will be supervision for toilet transfers   Status On-going   OT LONG TERM GOAL #6   Title Pt will be min a for shower transfers   Status On-going   OT LONG TERM GOAL #7   Title Pt will  be supervision for simple hot meal prep   Status On-going               Plan - 01/17/15 1707    Clinical Impression Statement Pt making good functional progress with mobility as well as attention to left side. Pt very motivated and has excellent family support.   Rehab Potential Good   Clinical Impairments Affecting Rehab Potential Pt will benefit from skilled OT to address deficits outlined in eval to maximize independence.   OT Frequency 2x / week   OT Duration 8 weeks   OT Treatment/Interventions Self-care/ADL training;Cryotherapy;Moist Heat;Electrical Stimulation;Fluidtherapy;DME and/or AE instruction;Energy conservation;Neuromuscular education;Therapeutic exercise;Functional Mobility Training;Manual Therapy;Passive range of motion;Splinting;Therapeutic  activities;Balance training;Patient/family education;Cognitive remediation/compensation   Plan neuro re ed for trunk control, RUE and functional mobility   Consulted and Agree with Plan of Care Patient;Family member/caregiver   Family Member Consulted wife        Problem List Patient Active Problem List   Diagnosis Date Noted  . Spastic hemiparesis affecting  nondominant side 11/08/2014  . Hypothyroidism 10/27/2014  . Left-sided neglect 09/23/2014  . Acute ischemic right MCA stroke 09/16/2014  . Left hemiparesis   . CVA (cerebral vascular accident)   . Right carotid artery occlusion   . Acute right MCA stroke   . Essential hypertension 09/12/2014  . CVA (cerebral infarction) 09/12/2014  . Type 2 diabetes mellitus with peripheral neuropathy 09/12/2014    Quay Burow, OTR/L 01/17/2015, 5:09 PM  [facility] [address] [place], [place], [address] Phone: [phone]   Fax:  [phone]

## 2015-01-17 NOTE — Therapy (Signed)
[facility] [address] [place], [place], [address] Phone: [phone]   Fax:  [phone]  Physical Therapy Treatment  Patient Details  Name: Brian Hall MRN: [id number] Date of Birth: 01/07/1956 Referring Provider:  Claretta Fraise, MD  Encounter Date: 01/17/2015      PT End of Session - 01/17/15 2010    Visit Number 7   Number of Visits 17   Date for PT Re-Evaluation 02/10/15   PT Start Time 1450   PT Stop Time 1530   PT Time Calculation (min) 40 min      Past Medical History  Diagnosis Date  . Hypertension   . Diabetes mellitus   . Obesity   . CHF (congestive heart failure)   . SOB (shortness of breath)   . Hyperkalemia   . Renal insufficiency   . Cardiogenic shock   . Cardiac LV ejection fraction 10-20%   . Hyperlipemia   . Hypothyroidism   . Throat cancer 2011    s/p neck dissection, radiation, chemo  . Stroke   . Blurred vision   . Joint pain   . Joint swelling     Past Surgical History  Procedure Laterality Date  . Cardiac catheterization    . Neck dissection Right 2011    s/p resection of throat cancer with multiple nodes removed    There were no vitals filed for this visit.  Visit Diagnosis:  Hemiplegia affecting left nondominant side  Impaired functional mobility and activity tolerance  Impaired sensation  Abnormality of gait      Subjective Assessment - 01/17/15 1451    Subjective Pt reporting occsaional pain in left wrist and left ankle. States it doesn't hurt right now. Pt reports he is ready to try to walk   Currently in Pain? No/denies            [facility] PT Assessment - 01/17/15 0001    Standardized Balance Assessment   Standardized Balance Assessment Berg Balance Test;Timed Up and Go Test   Berg Balance Test   Sit to Stand Able to stand  independently using hands   Standing Unsupported Able to stand 2 minutes with supervision   Sitting with Back Unsupported  but Feet Supported on Floor or Stool Able to sit safely and securely 2 minutes   Stand to Sit Controls descent by using hands   Transfers Able to transfer with verbal cueing and /or supervision   Standing Unsupported with Eyes Closed Able to stand 10 seconds with supervision   Standing Ubsupported with Feet Together Needs help to attain position but able to stand for 30 seconds with feet together   From Standing, Reach Forward with Outstretched Arm Can reach confidently >25 cm (10")   From Standing Position, Pick up Object from Floor Unable to pick up shoe, but reaches 2-5 cm (1-2") from shoe and balances independently   From Standing Position, Turn to Look Behind Over each Shoulder Turn sideways only but maintains balance   Turn 360 Degrees Needs assistance while turning   Standing Unsupported, Alternately Place Feet on Step/Stool Needs assistance to keep from falling or unable to try   Standing Unsupported, One Foot in Front Able to take small step independently and hold 30 seconds   Standing on One Leg Able to lift leg independently and hold > 10 seconds   Total Score 33       Neuro re-ed: Checked Berg Balance Test-see above Checked TUG--46.81 seconds with large based  quad cane, intial trail with impulsive/unsafe start. Pt was instructed to sit down rather than continuing to ambulate unsafely, and perform again.  Sit to stands-Pt completed 6 of 10 trials without BUE push off. On the 4 unsuccessful attempts, pt demonstrated lack of forward lean, and did not properly position LLE for sit to stand.  Practiced sit to stands and transfers with emphasis on increasing weight bearing through LLE.   -Standing with RUE support on a chair back in front of him, mirror for visual cue, active assisted LLE isolated hamstring curls with MAX then MOD A. -retro walking x3' with RUE support with pt utilizing hip extension + knee flexion of LLE.                        PT Short Term Goals  - 01/17/15 1454    PT SHORT TERM GOAL #1   Title Pt will perform HEP with supervision of family, for improved strength and balance. (Target 01/10/15)   Time 4   Period Weeks   Status New   PT SHORT TERM GOAL #2   Title Pt will improve Timed UP and Go score to less than or equal to 35 seconds for decreased fall risk.   Baseline eval-46.78 sec   Time 4   Period Weeks   Status Not Met  46.81 with quad cane and close supervision and occasional CGA   PT SHORT TERM GOAL #3   Title Berg Balance score to be assessed, with pt scoring at least 5 additional points to decrease fall risk.   Time 4   Period Weeks   Status Achieved  Scored 33 today   PT SHORT TERM GOAL #4   Title Pt will perform at least 6 of 10 reps of sit<>stand transfers no UE support, with equal weightbearing through bilateral lower extremities, independently.   Time 4   Period Weeks   Status Achieved           PT Long Term Goals - 12/14/14 1310    PT LONG TERM GOAL #1   Title Pt will verbalize understanding of fall prevention within home environment. Target 02/10/15   Time 8   Period Weeks   Status New   PT LONG TERM GOAL #2   Title Pt will improve TUG to less than or equal to 25 seconds for decreased fall risk.   Time 8   Period Weeks   Status New   PT LONG TERM GOAL #3   Title Pt will improve gait velocity to at least 1 ft/sec for improved gait efficiency and safety.   Time 8   Period Weeks   Status New   PT LONG TERM GOAL #4   Title Pt will ambulate at least 50-100 ft using hemiwalker, household distances, with supervision.   Time 8   Status New               Plan - 01/17/15 2011    Clinical Impression Statement Pt continues to requires verbal cues for LLE positioning during transfers to promote increased weight bearing through LLE upon rising. Pt demonstrated significant improvement in Berg Balance Test score compared to eval when re-tested today. Continue per plan of care.   PT Next Visit Plan  Recommend using mirror and assist, then active assist, then active movement for continued training of isolated L hamstrings with hip in neutral, and pelvic mobility training, LLE single limb stance training.  Problem List Patient Active Problem List   Diagnosis Date Noted  . Spastic hemiparesis affecting nondominant side 11/08/2014  . Hypothyroidism 10/27/2014  . Left-sided neglect 09/23/2014  . Acute ischemic right MCA stroke 09/16/2014  . Left hemiparesis   . CVA (cerebral vascular accident)   . Right carotid artery occlusion   . Acute right MCA stroke   . Essential hypertension 09/12/2014  . CVA (cerebral infarction) 09/12/2014  . Type 2 diabetes mellitus with peripheral neuropathy 09/12/2014   Delrae Sawyers, PT,DPT,NCS 01/17/2015 8:26 PM Phone [phone] FAX [phone]         [facility] [address] [place], [place], [address] Phone: [phone]   Fax:  [phone]

## 2015-01-19 ENCOUNTER — Ambulatory Visit: Payer: 59 | Admitting: Occupational Therapy

## 2015-01-19 ENCOUNTER — Ambulatory Visit: Payer: 59

## 2015-01-19 ENCOUNTER — Encounter: Payer: Self-pay | Admitting: Occupational Therapy

## 2015-01-19 DIAGNOSIS — R269 Unspecified abnormalities of gait and mobility: Secondary | ICD-10-CM

## 2015-01-19 DIAGNOSIS — I6931 Cognitive deficits following cerebral infarction: Secondary | ICD-10-CM | POA: Diagnosis not present

## 2015-01-19 DIAGNOSIS — G8194 Hemiplegia, unspecified affecting left nondominant side: Secondary | ICD-10-CM

## 2015-01-19 DIAGNOSIS — R201 Hypoesthesia of skin: Secondary | ICD-10-CM

## 2015-01-19 DIAGNOSIS — R414 Neurologic neglect syndrome: Secondary | ICD-10-CM

## 2015-01-19 DIAGNOSIS — R4189 Other symptoms and signs involving cognitive functions and awareness: Secondary | ICD-10-CM

## 2015-01-19 NOTE — Patient Instructions (Signed)
Functional use of LUE with RUE assisting - see note

## 2015-01-19 NOTE — Therapy (Signed)
[facility] [address] [place], [place], [address] Phone: [phone]   Fax:  [phone]  Occupational Therapy Treatment  Patient Details  Name: Brian Hall MRN: [id number] Date of Birth: 02/18/1956 Referring Provider:  Stacks, Warren, MD  Encounter Date: 01/19/2015      OT End of Session - 01/19/15 1231    Visit Number 9   Number of Visits 16   Date for OT Re-Evaluation 02/15/15   Authorization Type wife states pt is on [employer]   Authorization Time Period pt will not return to clinic until 12/21/14 in order to allow time to clarify insurance.   OT Start Time 1017   OT Stop Time 1100   OT Time Calculation (min) 43 min      Past Medical History  Diagnosis Date  . Hypertension   . Diabetes mellitus   . Obesity   . CHF (congestive heart failure)   . SOB (shortness of breath)   . Hyperkalemia   . Renal insufficiency   . Cardiogenic shock   . Cardiac LV ejection fraction 10-20%   . Hyperlipemia   . Hypothyroidism   . Throat cancer 2011    s/p neck dissection, radiation, chemo  . Stroke   . Blurred vision   . Joint pain   . Joint swelling     Past Surgical History  Procedure Laterality Date  . Cardiac catheterization    . Neck dissection Right 2011    s/p resection of throat cancer with multiple nodes removed    There were no vitals filed for this visit.  Visit Diagnosis:  Hemiplegia affecting left nondominant side  Impaired sensation  Left-sided neglect  Impaired cognition      Subjective Assessment - 01/19/15 1020    Subjective  I am trying to use my arm more   Pertinent History see epic snapshot   Patient Stated Goals Get back to doing some of the things I used to do.   Currently in Pain? Yes   Pain Score 2    Pain Location Ankle  see PT note                      OT Treatments/Exercises (OP) - 01/19/15 0001    Neurological Re-education Exercises   Other  Exercises 1 Neuro re ed to address functional use of LUE during functional tasks with practice to include washing body, holding cup, holding and squeezing toothpaste, holding and moving styrofoam cup using RUE as assist to take weight off of LUE to allow more normal movement. Pt and wife able to return demonstrate and pt to practice at home with wife's assistance PRN. Wife does an excellent job of cuing pt to avoid abnormal movement patterns - some activities designed to provide feedback for grading.                OT Education - 01/19/15 1228    Education provided Yes   Education Details functional use of LUE   Person(s) Educated Patient;Spouse   Methods Explanation;Demonstration;Tactile cues;Verbal cues;Handout   Comprehension Verbalized understanding;Returned demonstration          OT Short Term Goals - 01/19/15 1229    OT SHORT TERM GOAL #1   Title Pt and wife will be mod I with HEP - 01/18/2015.  Goal dates adjusted as pt will not return to clinic prior to 12/21/14   Status Achieved   OT SHORT TERM GOAL #2     Title Pt will require min a for LB dressing   Status Achieved   OT SHORT TERM GOAL #3   Title Pt will demonstrate ability to use LUE as gross assist during basic self care 50% of the time.   Status Partially Met   OT SHORT TERM GOAL #4   Title Pt will need no more than min vc's to compensate for L neglect during functional mobility tasks   Status Achieved   OT SHORT TERM GOAL #5   Title Pt will demonstrate ability to attend to familiar functional task for 15 minute with no more than one vc.   Status Achieved   OT SHORT TERM GOAL #6   Title Pt will require no more than min assist for toilet transfers   Status Achieved           OT Long Term Goals - 01/19/15 [place] #1   Title Pt and wife will be mod I with upgraded HEP - 02/15/2015. Goal date adjusted as pt will not return to clinic prior to 12/21/2014   Status On-going   OT LONG TERM GOAL #2    Title Pt will demonstrate ability to use LUE as gross assist 100% of the time in basic self care tasks   Status On-going   OT LONG TERM GOAL #3   Title Pt will demonstrate ability to hold cylindrical object in L hand.    Status On-going   OT LONG TERM GOAL #4   Title Pt will verbalize understanding of AE for buttoning, zipping and cutting   Status On-going   OT LONG TERM GOAL #5   Title Pt will be supervision for toilet transfers   Status On-going   OT LONG TERM GOAL #6   Title Pt will be min a for shower transfers   Status On-going   OT LONG TERM GOAL #7   Title Pt will  be supervision for simple hot meal prep   Status On-going               Plan - 01/19/15 1229    Clinical Impression Statement Pt making good progress toward goals and is using LUE more functionally at home. Pt also with improved functional mobility. Pt continues to need mod cues for impulsivity and safety.   Rehab Potential Good   Clinical Impairments Affecting Rehab Potential Pt will benefit from skilled OT to address deficits outlined in eval to maximize independence.   OT Frequency 2x / week   OT Duration 8 weeks   OT Treatment/Interventions Self-care/ADL training;Cryotherapy;Moist Heat;Electrical Stimulation;Fluidtherapy;DME and/or AE instruction;Energy conservation;Neuromuscular education;Therapeutic exercise;Functional Mobility Training;Manual Therapy;Passive range of motion;Splinting;Therapeutic activities;Balance training;Patient/family education;Cognitive remediation/compensation   Plan neuro re ed for trunk control, LUE use and functional mobility   OT Home Exercise Plan issued inital HEP-12/27/14, cane shoulder flexion 01/03/15   Consulted and Agree with Plan of Care Patient;Family member/caregiver   Family Member Consulted wife        Problem List Patient Active Problem List   Diagnosis Date Noted  . Spastic hemiparesis affecting nondominant side 11/08/2014  . Hypothyroidism 10/27/2014  .  Left-sided neglect 09/23/2014  . Acute ischemic right MCA stroke 09/16/2014  . Left hemiparesis   . CVA (cerebral vascular accident)   . Right carotid artery occlusion   . Acute right MCA stroke   . Essential hypertension 09/12/2014  . CVA (cerebral infarction) 09/12/2014  . Type 2 diabetes mellitus with peripheral neuropathy 09/12/2014  ,  Halliday, OTR/L 01/19/2015, 12:32 PM  [facility] [address] [place], [place], [address] Phone: [phone]   Fax:  [phone]    

## 2015-01-19 NOTE — Therapy (Signed)
[facility] [address] [place], [place], [address] Phone: [phone]   Fax:  [phone]  Physical Therapy Treatment  Patient Details  Name: Brian Hall MRN: [id number] Date of Birth: [birth year] Referring Provider:  Claretta Fraise, MD  Encounter Date: 01/19/2015      PT End of Session - 01/19/15 1121    Visit Number 8   Number of Visits 17   Date for PT Re-Evaluation 02/10/15   PT Start Time 0932   PT Stop Time 1015   PT Time Calculation (min) 43 min      Past Medical History  Diagnosis Date  . Hypertension   . Diabetes mellitus   . Obesity   . CHF (congestive heart failure)   . SOB (shortness of breath)   . Hyperkalemia   . Renal insufficiency   . Cardiogenic shock   . Cardiac LV ejection fraction 10-20%   . Hyperlipemia   . Hypothyroidism   . Throat cancer 2011    s/p neck dissection, radiation, chemo  . Stroke   . Blurred vision   . Joint pain   . Joint swelling     Past Surgical History  Procedure Laterality Date  . Cardiac catheterization    . Neck dissection Right 2011    s/p resection of throat cancer with multiple nodes removed    There were no vitals filed for this visit.  Visit Diagnosis:  Hemiplegia affecting left nondominant side  Impaired sensation  Abnormality of gait      Subjective Assessment - 01/19/15 0934    Subjective (p) no falls, denies pain   Currently in Pain? (p) No/denies       Pt rolled supine to prone with verbal cues for LUE positioning only. Prone quad stretch passive 3x30 seconds for LLE Prone ham curls with MIN/MOD A and tapping for facilitation and NMES to hamstrings. Mirror used for visual cue due to impaired proprioception sensation. Supine ham curls with NMES to hamstrings with MIN A and mirror Standing ham curls with mirror with MIN A and without electrical etimulation with BUE support. LLE SLS with RLE repeated tapping on 8" box with  MIN A to increase L weight shift, with mirror for improved awareness of body position in L weight shift. Alt foot taps on 8" step with MIN A to decrease LLE circumduction while flexing hip, with mirror for visual cue. Standing knee flexion hip extension to target for improved movement out of synergy.                           PT Short Term Goals - 01/19/15 1126    PT SHORT TERM GOAL #1   Title Pt will perform HEP with supervision of family, for improved strength and balance. (Target 01/10/15)   Time 4   Period Weeks   Status Achieved   PT SHORT TERM GOAL #2   Title Pt will improve Timed UP and Go score to less than or equal to 35 seconds for decreased fall risk.   Baseline eval-46.78 sec   Time 4   Period Weeks   Status Not Met  46.81 with quad cane and close supervision and occasional CGA   PT SHORT TERM GOAL #3   Title Berg Balance score to be assessed, with pt scoring at least 5 additional points to decrease fall risk.   Time 4   Period Weeks   Status Achieved  Scored  69 today   PT SHORT TERM GOAL #4   Title Pt will perform at least 6 of 10 reps of sit<>stand transfers no UE support, with equal weightbearing through bilateral lower extremities, independently.   Time 4   Period Weeks   Status Achieved           PT Long Term Goals - 12/14/14 1310    PT LONG TERM GOAL #1   Title Pt will verbalize understanding of fall prevention within home environment. Target 02/10/15   Time 8   Period Weeks   Status New   PT LONG TERM GOAL #2   Title Pt will improve TUG to less than or equal to 25 seconds for decreased fall risk.   Time 8   Period Weeks   Status New   PT LONG TERM GOAL #3   Title Pt will improve gait velocity to at least 1 ft/sec for improved gait efficiency and safety.   Time 8   Period Weeks   Status New   PT LONG TERM GOAL #4   Title Pt will ambulate at least 50-100 ft using hemiwalker, household distances, with supervision.   Time 8    Status New               Plan - 01/19/15 1122    Clinical Impression Statement Pt demonstrated improved LLE positioning during transfers with minimal verbal cues today. Emphasis today on increasing hamstring activation with knee flexion out of synergy which will improve gait and balance. Continue per plan of care.   PT Next Visit Plan L ankle dorsiflexion--increase PROM, work on activation using e-stim as needed; gait training with quad cane   Consulted and Agree with Plan of Care Patient        Problem List Patient Active Problem List   Diagnosis Date Noted  . Spastic hemiparesis affecting nondominant side 11/08/2014  . Hypothyroidism 10/27/2014  . Left-sided neglect 09/23/2014  . Acute ischemic right MCA stroke 09/16/2014  . Left hemiparesis   . CVA (cerebral vascular accident)   . Right carotid artery occlusion   . Acute right MCA stroke   . Essential hypertension 09/12/2014  . CVA (cerebral infarction) 09/12/2014  . Type 2 diabetes mellitus with peripheral neuropathy 09/12/2014   Delrae Sawyers, PT,DPT,NCS 01/19/2015 11:32 AM Phone [phone] FAX [phone]         [facility] [address] [place], [place], [address] Phone: [phone]   Fax:  [phone]

## 2015-01-23 ENCOUNTER — Ambulatory Visit: Payer: 59 | Admitting: Occupational Therapy

## 2015-01-23 ENCOUNTER — Encounter: Payer: Self-pay | Admitting: Occupational Therapy

## 2015-01-23 DIAGNOSIS — R4189 Other symptoms and signs involving cognitive functions and awareness: Secondary | ICD-10-CM

## 2015-01-23 DIAGNOSIS — G8194 Hemiplegia, unspecified affecting left nondominant side: Secondary | ICD-10-CM

## 2015-01-23 DIAGNOSIS — R414 Neurologic neglect syndrome: Secondary | ICD-10-CM

## 2015-01-23 DIAGNOSIS — R201 Hypoesthesia of skin: Secondary | ICD-10-CM

## 2015-01-23 DIAGNOSIS — I6931 Cognitive deficits following cerebral infarction: Secondary | ICD-10-CM | POA: Diagnosis not present

## 2015-01-23 NOTE — Therapy (Signed)
[facility] [address] [place], [place], [address] Phone: [phone]   Fax:  [phone]  Occupational Therapy Treatment  Patient Details  Name: Brian Hall MRN: [id number] Date of Birth: 02-Sep-1956 Referring Provider:  Claretta Fraise, MD  Encounter Date: 01/23/2015      OT End of Session - 01/23/15 1626    Visit Number 10   Number of Visits 15   Date for OT Re-Evaluation 02/15/15   Authorization Type wife states pt is on [employer]   Authorization Time Period pt will not return to clinic until 12/21/14 in order to allow time to clarify insurance.   OT Start Time 1531   OT Stop Time 1615   OT Time Calculation (min) 44 min   Activity Tolerance Patient tolerated treatment well      Past Medical History  Diagnosis Date  . Hypertension   . Diabetes mellitus   . Obesity   . CHF (congestive heart failure)   . SOB (shortness of breath)   . Hyperkalemia   . Renal insufficiency   . Cardiogenic shock   . Cardiac LV ejection fraction 10-20%   . Hyperlipemia   . Hypothyroidism   . Throat cancer 2011    s/p neck dissection, radiation, chemo  . Stroke   . Blurred vision   . Joint pain   . Joint swelling     Past Surgical History  Procedure Laterality Date  . Cardiac catheterization    . Neck dissection Right 2011    s/p resection of throat cancer with multiple nodes removed    There were no vitals filed for this visit.  Visit Diagnosis:  Hemiplegia affecting left nondominant side  Impaired sensation  Left-sided neglect  Impaired cognition      Subjective Assessment - 01/23/15 1535    Subjective  I have some questions    Pertinent History see epic snapshot   Patient Stated Goals Get back to doing some of the things I used to do.   Currently in Pain? Yes   Pain Score 2    Pain Location Wrist   Pain Orientation Left   Pain Descriptors / Indicators Stabbing   Pain Type Chronic pain   Pain Onset  More than a month ago   Pain Frequency Intermittent   Aggravating Factors  moving my wrist around   Pain Relieving Factors positioning, rest                      OT Treatments/Exercises (OP) - 01/23/15 0001    ADLs   Toileting practiced stand step toilet transfers with [facility] with emphasis on sit to stand, finding midline, weight shifting, stand step transfer with correct sequence, and stand to sit. Pt requires min vc's to slow down and maintain balance during transfer. Pt initally required min a but with practice progressed to contact guard.   Neurological Re-education Exercises   Other Exercises 1 Neuro re ed to address sit to stand and stand to sit without UE support, finding and maintaining midlline, lateral weight shifts and functional ambulation with [facility].  Pt requires light contact guard and mod vc's.                  OT Short Term Goals - 01/23/15 1625    OT SHORT TERM GOAL #1   Title Pt and wife will be mod I with HEP - 01/18/2015.  Goal dates adjusted as pt will not return to clinic  prior to 12/21/14   Status Achieved   OT SHORT TERM GOAL #2   Title Pt will require min a for LB dressing   Status Achieved   OT SHORT TERM GOAL #3   Title Pt will demonstrate ability to use LUE as gross assist during basic self care 50% of the time.   Status Partially Met   OT SHORT TERM GOAL #4   Title Pt will need no more than min vc's to compensate for L neglect during functional mobility tasks   Status Achieved   OT SHORT TERM GOAL #5   Title Pt will demonstrate ability to attend to familiar functional task for 15 minute with no more than one vc.   Status Achieved   OT SHORT TERM GOAL #6   Title Pt will require no more than min assist for toilet transfers   Status Achieved           OT Long Term Goals - 01/23/15 1625    OT LONG TERM GOAL #1   Title Pt and wife will be mod I with upgraded HEP - 02/15/2015. Goal date adjusted as pt will not return to clinic prior  to 12/21/2014   Status On-going   OT LONG TERM GOAL #2   Title Pt will demonstrate ability to use LUE as gross assist 100% of the time in basic self care tasks   Status On-going   OT LONG TERM GOAL #3   Title Pt will demonstrate ability to hold cylindrical object in L hand.    Status On-going   OT LONG TERM GOAL #4   Title Pt will verbalize understanding of AE for buttoning, zipping and cutting   Status On-going   OT LONG TERM GOAL #5   Title Pt will be supervision for toilet transfers   Status On-going   OT LONG TERM GOAL #6   Title Pt will be min a for shower transfers   Status On-going   OT LONG TERM GOAL #7   Title Pt will  be supervision for simple hot meal prep   Status On-going               Plan - 01/23/15 1625    Clinical Impression Statement Pt wtih significant progress in functional mobility, balance and safety since last visit. Wife states mobility is easier at home as well.   Rehab Potential Good   Clinical Impairments Affecting Rehab Potential Pt will benefit from skilled OT to address deficits outlined in eval to maximize independence.   OT Frequency 2x / week   OT Duration 8 weeks   OT Treatment/Interventions Self-care/ADL training;Cryotherapy;Moist Heat;Electrical Stimulation;Fluidtherapy;DME and/or AE instruction;Energy conservation;Neuromuscular education;Therapeutic exercise;Functional Mobility Training;Manual Therapy;Passive range of motion;Splinting;Therapeutic activities;Balance training;Patient/family education;Cognitive remediation/compensation   Plan neuro re ed for balance, functional mobility, LUE use   OT Home Exercise Plan issued inital HEP-12/27/14, cane shoulder flexion 01/03/15   Consulted and Agree with Plan of Care Patient;Family member/caregiver   Family Member Consulted wife        Problem List Patient Active Problem List   Diagnosis Date Noted  . Spastic hemiparesis affecting nondominant side 11/08/2014  . Hypothyroidism 10/27/2014   . Left-sided neglect 09/23/2014  . Acute ischemic right MCA stroke 09/16/2014  . Left hemiparesis   . CVA (cerebral vascular accident)   . Right carotid artery occlusion   . Acute right MCA stroke   . Essential hypertension 09/12/2014  . CVA (cerebral infarction) 09/12/2014  . Type 2 diabetes mellitus with  peripheral neuropathy 09/12/2014    Quay Burow, OTR/L 01/23/2015, 4:27 PM  [facility] [address] [place], [place], [address] Phone: [phone]   Fax:  [phone]

## 2015-01-25 ENCOUNTER — Ambulatory Visit: Payer: 59

## 2015-01-25 DIAGNOSIS — G8194 Hemiplegia, unspecified affecting left nondominant side: Secondary | ICD-10-CM

## 2015-01-25 DIAGNOSIS — I6931 Cognitive deficits following cerebral infarction: Secondary | ICD-10-CM | POA: Diagnosis not present

## 2015-01-25 DIAGNOSIS — R269 Unspecified abnormalities of gait and mobility: Secondary | ICD-10-CM

## 2015-01-25 NOTE — Therapy (Signed)
[facility] [address] [place], [place], [address] Phone: [phone]   Fax:  [phone]  Physical Therapy Treatment  Patient Details  Name: Brian Hall MRN: [id number] Date of Birth: [birth year] Referring Provider:  Claretta Fraise, MD  Encounter Date: 01/25/2015      PT End of Session - 01/25/15 1205    Visit Number 9   Number of Visits 17   PT Start Time 2952   PT Stop Time 1145   PT Time Calculation (min) 42 min   Equipment Utilized During Treatment Gait belt   Activity Tolerance Patient tolerated treatment well      Past Medical History  Diagnosis Date  . Hypertension   . Diabetes mellitus   . Obesity   . CHF (congestive heart failure)   . SOB (shortness of breath)   . Hyperkalemia   . Renal insufficiency   . Cardiogenic shock   . Cardiac LV ejection fraction 10-20%   . Hyperlipemia   . Hypothyroidism   . Throat cancer 2011    s/p neck dissection, radiation, chemo  . Stroke   . Blurred vision   . Joint pain   . Joint swelling     Past Surgical History  Procedure Laterality Date  . Cardiac catheterization    . Neck dissection Right 2011    s/p resection of throat cancer with multiple nodes removed    There were no vitals filed for this visit.  Visit Diagnosis:  Hemiplegia affecting left nondominant side  Abnormality of gait      Subjective Assessment - 01/25/15 1134    Subjective Pt reporting same pain in L wrist states that it is due to bones rubbing together.    Currently in Pain? Yes   Pain Score 2    Pain Location Wrist   Pain Orientation Left   Pain Descriptors / Indicators Burning   Pain Type Chronic pain   Pain Onset More than a month ago   Pain Frequency Intermittent   Aggravating Factors  moving wrist   Pain Relieving Factors wearing brace        Gait training x115' with large based quad cane at start of session with MOD A on 5 occasions to steady due to  excess R weight shift and R lateral lean in L swing phase leading to loss of balance (therapist steadied pt with MOD A). Pt noted to also have decreased L hip flexion in swing phase and decreased L foot clearance in swing.  Therex: Passive calf stretching 3x30 seconds of LLE provided by PT. Then progressed to standing calf stretch with LLE  Forefoot on 2" step stool with heel dropping off. Performed with RUE support on quad cane.    Neuro re-ed: Weight shifting activities with mirror for visual cue: initially with feet apart, performed lateral weight shifting, then progressed to RLE single limb stance with emphasis on maintaining upright posture with shoulder, hip, knee, ankle aligned to decrease loss of balance toward R (performed with RUE support on large based quad cane). Then RLE single limb stance with LLE MIN assisted foot taps on 2" box then on 6" box with MIN A to decrease hip circumduction and promote hip flexion. Verbal cues to prevent excessive R posterolateral weight shift. Then repeated RLE hip flexion with RLE start position on 6" stool with facilitation at hip flexors.   Gait training again with pt instructed to focus on flexing the L hip and knee in swing  for improved L foot cleraance and to decrease the R postero-lateral weight shift in L swing phase. Ambulated x115' with only CGA and tactile cue to L hip flexor in swing phase. Significant improvement in upright posture with decreased lateral lean and increased foot clearance.                             PT Short Term Goals - 01/19/15 1126    PT SHORT TERM GOAL #1   Title Pt will perform HEP with supervision of family, for improved strength and balance. (Target 01/10/15)   Time 4   Period Weeks   Status Achieved   PT SHORT TERM GOAL #2   Title Pt will improve Timed UP and Go score to less than or equal to 35 seconds for decreased fall risk.   Baseline eval-46.78 sec   Time 4   Period Weeks   Status Not Met   46.81 with quad cane and close supervision and occasional CGA   PT SHORT TERM GOAL #3   Title Berg Balance score to be assessed, with pt scoring at least 5 additional points to decrease fall risk.   Time 4   Period Weeks   Status Achieved  Scored 33 today   PT SHORT TERM GOAL #4   Title Pt will perform at least 6 of 10 reps of sit<>stand transfers no UE support, with equal weightbearing through bilateral lower extremities, independently.   Time 4   Period Weeks   Status Achieved           PT Long Term Goals - 12/14/14 1310    PT LONG TERM GOAL #1   Title Pt will verbalize understanding of fall prevention within home environment. Target 02/10/15   Time 8   Period Weeks   Status New   PT LONG TERM GOAL #2   Title Pt will improve TUG to less than or equal to 25 seconds for decreased fall risk.   Time 8   Period Weeks   Status New   PT LONG TERM GOAL #3   Title Pt will improve gait velocity to at least 1 ft/sec for improved gait efficiency and safety.   Time 8   Period Weeks   Status New   PT LONG TERM GOAL #4   Title Pt will ambulate at least 50-100 ft using hemiwalker, household distances, with supervision.   Time 8   Status New               Plan - 01/25/15 1206    Clinical Impression Statement Pt demonstrated improved knee flexion in pre-swing today (good carry over of isolated knee flexion with neutral hip from prior sessions). From start to end of session, assist for steadying for ambulation improved from MOD A to CGA. Expected to continue to make significant progress to goals. Continu per plan of care.   PT Next Visit Plan Stretch L calf, e-stim as appropriate for improved anterior tib contraction, gait training with large based quad cane        Problem List Patient Active Problem List   Diagnosis Date Noted  . Spastic hemiparesis affecting nondominant side 11/08/2014  . Hypothyroidism 10/27/2014  . Left-sided neglect 09/23/2014  . Acute ischemic  right MCA stroke 09/16/2014  . Left hemiparesis   . CVA (cerebral vascular accident)   . Right carotid artery occlusion   . Acute right MCA stroke   . Essential hypertension  09/12/2014  . CVA (cerebral infarction) 09/12/2014  . Type 2 diabetes mellitus with peripheral neuropathy 09/12/2014   Delrae Sawyers, PT,DPT,NCS 01/25/2015 12:11 PM Phone [phone] FAX [phone]         [facility] [address] [place], [place], [address] Phone: [phone]   Fax:  [phone]

## 2015-01-26 ENCOUNTER — Ambulatory Visit: Payer: 59

## 2015-01-26 ENCOUNTER — Encounter: Payer: Self-pay | Admitting: Occupational Therapy

## 2015-01-26 ENCOUNTER — Ambulatory Visit: Payer: 59 | Admitting: Occupational Therapy

## 2015-01-26 DIAGNOSIS — R201 Hypoesthesia of skin: Secondary | ICD-10-CM

## 2015-01-26 DIAGNOSIS — Z7409 Other reduced mobility: Secondary | ICD-10-CM

## 2015-01-26 DIAGNOSIS — I6931 Cognitive deficits following cerebral infarction: Secondary | ICD-10-CM | POA: Diagnosis not present

## 2015-01-26 DIAGNOSIS — R414 Neurologic neglect syndrome: Secondary | ICD-10-CM

## 2015-01-26 DIAGNOSIS — G8194 Hemiplegia, unspecified affecting left nondominant side: Secondary | ICD-10-CM

## 2015-01-26 DIAGNOSIS — R269 Unspecified abnormalities of gait and mobility: Secondary | ICD-10-CM

## 2015-01-26 DIAGNOSIS — R4189 Other symptoms and signs involving cognitive functions and awareness: Secondary | ICD-10-CM

## 2015-01-26 NOTE — Therapy (Signed)
[facility] [address] [place], [place], [address] Phone: [phone]   Fax:  [phone]  Physical Therapy Treatment  Patient Details  Name: Brian Hall MRN: [id number] Date of Birth: 24-Jul-1956 Referring Provider:  Claretta Fraise, MD  Encounter Date: 01/26/2015      PT End of Session - 01/26/15 1448    Visit Number 10   Number of Visits 17   Date for PT Re-Evaluation 02/10/15   PT Start Time 0254   PT Stop Time 1400   PT Time Calculation (min) 45 min   Equipment Utilized During Treatment Gait belt   Activity Tolerance Patient tolerated treatment well   Behavior During Therapy [facility] for tasks assessed/performed      Past Medical History  Diagnosis Date  . Hypertension   . Diabetes mellitus   . Obesity   . CHF (congestive heart failure)   . SOB (shortness of breath)   . Hyperkalemia   . Renal insufficiency   . Cardiogenic shock   . Cardiac LV ejection fraction 10-20%   . Hyperlipemia   . Hypothyroidism   . Throat cancer 2011    s/p neck dissection, radiation, chemo  . Stroke   . Blurred vision   . Joint pain   . Joint swelling     Past Surgical History  Procedure Laterality Date  . Cardiac catheterization    . Neck dissection Right 2011    s/p resection of throat cancer with multiple nodes removed    There were no vitals filed for this visit.  Visit Diagnosis:  Hemiplegia affecting left nondominant side  Abnormality of gait  Impaired sensation  Left-sided neglect  Impaired functional mobility and activity tolerance      Subjective Assessment - 01/26/15 1423    Subjective pt reports same L wrist pain and L ankle pain which he reports he has had since hi s stroke.    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  He has a motorcycle and would like to  ride it again.   Currently in Pain? Yes   Pain Score 2    Pain Location Wrist   Pain Orientation Left   Pain Descriptors / Indicators Burning   Pain Type Chronic pain   Pain Onset More than a month ago      Treatment  Neuromuscular Re-education: -Weight shifting activities with mirror for visual cue: with feet apart, performed lateral weight shifting, then progressed to RLE single limb stance with emphasis on maintaining upright posture with shoulder, hip, knee, ankle aligned to decrease loss of balance toward R (performed with RUE support with hemiwalker). Pt also practiced bearing weight through LLE placed on foam pad, encouraging R hip hike to bear weight through LLE. -RLE single limb stance with LLE MIN assisted foot taps on 6" box with MIN A to decrease hip circumduction and trunk extension and promote hip and knee flexion. Verbal cues to prevent excessive R posterolateral weight shift. Then repeated RLE hip flexion with RLE start position on 6" stool with facilitation at hip flexors and anterior tib.    Therapeutic Exercise:  -Prone quad stretch passive 2x30 seconds for bilateral LEs -Prone hamstring curls with MIN/MOD assist to complete ROM -Supine marches with knees flexed and feet in neutral position on mat x 10 reps bilaterally.  PT Short Term Goals - 01/19/15 1126    PT SHORT TERM GOAL #1   Title Pt will perform HEP with supervision of family, for improved strength and balance. (Target 01/10/15)   Time 4   Period Weeks   Status Achieved   PT SHORT TERM GOAL #2   Title Pt will improve Timed UP and Go score to less than or equal to 35 seconds for decreased fall risk.   Baseline eval-46.78 sec   Time 4   Period Weeks   Status Not Met  46.81 with quad cane and close supervision and occasional CGA   PT SHORT TERM GOAL #3   Title Berg Balance score to be assessed, with pt scoring at least 5 additional points to decrease fall risk.    Time 4   Period Weeks   Status Achieved  Scored 33 today   PT SHORT TERM GOAL #4   Title Pt will perform at least 6 of 10 reps of sit<>stand transfers no UE support, with equal weightbearing through bilateral lower extremities, independently.   Time 4   Period Weeks   Status Achieved           PT Long Term Goals - 12/14/14 1310    PT LONG TERM GOAL #1   Title Pt will verbalize understanding of fall prevention within home environment. Target 02/10/15   Time 8   Period Weeks   Status New   PT LONG TERM GOAL #2   Title Pt will improve TUG to less than or equal to 25 seconds for decreased fall risk.   Time 8   Period Weeks   Status New   PT LONG TERM GOAL #3   Title Pt will improve gait velocity to at least 1 ft/sec for improved gait efficiency and safety.   Time 8   Period Weeks   Status New   PT LONG TERM GOAL #4   Title Pt will ambulate at least 50-100 ft using hemiwalker, household distances, with supervision.   Time 8   Status New               Plan - 01/26/15 1448    Clinical Impression Statement Today pt demonstrated improved hip and knee flexion in stance with visual and tactile cues for movement. Pt still requires Mod A to CGA with gait for steadiness during gait. pt is continuing to make progress towards goals.    Pt will benefit from skilled therapeutic intervention in order to improve on the following deficits Abnormal gait;Decreased activity tolerance;Decreased balance;Decreased mobility;Decreased range of motion;Decreased safety awareness;Difficulty walking;Decreased strength;Impaired tone   Rehab Potential Good   PT Frequency 2x / week   PT Duration 8 weeks   PT Treatment/Interventions ADLs/Self Care Home Management;Therapeutic activities;Functional mobility training;Stair training;Gait training;Therapeutic exercise;Balance training;Neuromuscular re-education;Patient/family education   PT Next Visit Plan Stretch L calf, e-stim as appropriate for improved  anterior tib contraction, gait training with large based quad cane; balance activites for L weight shifting; controlled L knee flexion activites in stance   Consulted and Agree with Plan of Care Patient   Family Member Consulted wife        Problem List Patient Active Problem List   Diagnosis Date Noted  . Spastic hemiparesis affecting nondominant side 11/08/2014  . Hypothyroidism 10/27/2014  . Left-sided neglect 09/23/2014  . Acute ischemic right MCA stroke 09/16/2014  . Left hemiparesis   . CVA (cerebral vascular accident)   . Right carotid artery occlusion   .  Acute right MCA stroke   . Essential hypertension 09/12/2014  . CVA (cerebral infarction) 09/12/2014  . Type 2 diabetes mellitus with peripheral neuropathy 09/12/2014   Delrae Sawyers, PT,DPT,NCS 01/27/2015 8:29 AM Phone [phone] FAX [phone]          I was present, supervising and guiding this treatment throughout the session. Delrae Sawyers, PT,DPT,NCS 01/27/2015 8:29 AM Phone [phone] FAX [phone]          [facility] [address] [place], [place], [address] Phone: [phone]   Fax:  [phone]

## 2015-01-26 NOTE — Therapy (Signed)
[facility] [address] [place], [place], [address] Phone: [phone]   Fax:  [phone]  Occupational Therapy Treatment  Patient Details  Name: Brian Hall MRN: [id number] Date of Birth: 22-Mar-1956 Referring Provider:  Claretta Fraise, MD  Encounter Date: 01/26/2015      OT End of Session - 01/26/15 1738    Visit Number 11   Number of Visits 15   Date for OT Re-Evaluation 02/15/15   Authorization Type wife states pt is on [employer]   Authorization Time Period pt will not return to clinic until 12/21/14 in order to allow time to clarify insurance.   OT Start Time 1531   OT Stop Time 1614   OT Time Calculation (min) 43 min   Activity Tolerance Patient tolerated treatment well      Past Medical History  Diagnosis Date  . Hypertension   . Diabetes mellitus   . Obesity   . CHF (congestive heart failure)   . SOB (shortness of breath)   . Hyperkalemia   . Renal insufficiency   . Cardiogenic shock   . Cardiac LV ejection fraction 10-20%   . Hyperlipemia   . Hypothyroidism   . Throat cancer 2011    s/p neck dissection, radiation, chemo  . Stroke   . Blurred vision   . Joint pain   . Joint swelling     Past Surgical History  Procedure Laterality Date  . Cardiac catheterization    . Neck dissection Right 2011    s/p resection of throat cancer with multiple nodes removed    There were no vitals filed for this visit.  Visit Diagnosis:  Hemiplegia affecting left nondominant side  Impaired sensation  Left-sided neglect  Impaired cognition  Impaired functional mobility and activity tolerance      Subjective Assessment - 01/26/15 1537    Subjective  My wrist is still bothering me   Pertinent History see epic snapshot   Patient Stated Goals Get back to doing some of the things I used to do.   Currently in Pain? Yes   Pain Score 5    Pain Location Wrist   Pain Orientation Left   Pain  Descriptors / Indicators Burning   Pain Type Chronic pain   Pain Onset More than a month ago   Pain Frequency Intermittent   Aggravating Factors  moving wrist   Pain Relieving Factors wearing splint, rest, not movng it                      OT Treatments/Exercises (OP) - 01/26/15 0001    Neurological Re-education Exercises   Other Exercises 1 Neuro re ed to address sit to stand, finding midline, alignment in standing, weight shifting, stepping to aide in transfers, and stand to sit. Pt with very poor awarenss of body in space however functional mobility and safety awareness are improving   Manual Therapy   Manual Therapy Joint mobilization;Soft tissue mobilization   Manual therapy comments jointmob and soft tissue mob to address wrist malalignment which causes pt pain. After treatment pt had 0/10 pain.                  OT Short Term Goals - 01/26/15 1737    OT SHORT TERM GOAL #1   Title Pt and wife will be mod I with HEP - 01/18/2015.  Goal dates adjusted as pt will not return to clinic prior to 12/21/14   Status  Achieved   OT SHORT TERM GOAL #2   Title Pt will require min a for LB dressing   Status Achieved   OT SHORT TERM GOAL #3   Title Pt will demonstrate ability to use LUE as gross assist during basic self care 50% of the time.   Status Partially Met   OT SHORT TERM GOAL #4   Title Pt will need no more than min vc's to compensate for L neglect during functional mobility tasks   Status Achieved   OT SHORT TERM GOAL #5   Title Pt will demonstrate ability to attend to familiar functional task for 15 minute with no more than one vc.   Status Achieved   OT SHORT TERM GOAL #6   Title Pt will require no more than min assist for toilet transfers   Status Achieved           OT Long Term Goals - 01/26/15 1737    OT LONG TERM GOAL #1   Title Pt and wife will be mod I with upgraded HEP - 02/15/2015. Goal date adjusted as pt will not return to clinic prior to  12/21/2014   Status On-going   OT LONG TERM GOAL #2   Title Pt will demonstrate ability to use LUE as gross assist 100% of the time in basic self care tasks   Status On-going   OT LONG TERM GOAL #3   Title Pt will demonstrate ability to hold cylindrical object in L hand.    Status On-going   OT LONG TERM GOAL #4   Title Pt will verbalize understanding of AE for buttoning, zipping and cutting   Status On-going   OT LONG TERM GOAL #5   Title Pt will be supervision for toilet transfers   Status On-going   OT LONG TERM GOAL #6   Title Pt will be min a for shower transfers   Status On-going   OT LONG TERM GOAL #7   Title Pt will  be supervision for simple hot meal prep   Status On-going               Plan - 01/26/15 1737    Clinical Impression Statement Pt continues to make functional gains in mobility, safety, standing balance and functional use of LUE. Wife very supportive.   Rehab Potential Good   Clinical Impairments Affecting Rehab Potential Pt will benefit from skilled OT to address deficits outlined in eval to maximize independence.   OT Frequency 2x / week   OT Duration 8 weeks   OT Treatment/Interventions Self-care/ADL training;Cryotherapy;Moist Heat;Electrical Stimulation;Fluidtherapy;DME and/or AE instruction;Energy conservation;Neuromuscular education;Therapeutic exercise;Functional Mobility Training;Manual Therapy;Passive range of motion;Splinting;Therapeutic activities;Balance training;Patient/family education;Cognitive remediation/compensation   Plan neuro re ed for balance, trunk control, functional mobility and LUE use   OT Home Exercise Plan issued inital HEP-12/27/14, cane shoulder flexion 01/03/15   Consulted and Agree with Plan of Care Patient;Family member/caregiver   Family Member Consulted wife        Problem List Patient Active Problem List   Diagnosis Date Noted  . Spastic hemiparesis affecting nondominant side 11/08/2014  . Hypothyroidism 10/27/2014   . Left-sided neglect 09/23/2014  . Acute ischemic right MCA stroke 09/16/2014  . Left hemiparesis   . CVA (cerebral vascular accident)   . Right carotid artery occlusion   . Acute right MCA stroke   . Essential hypertension 09/12/2014  . CVA (cerebral infarction) 09/12/2014  . Type 2 diabetes mellitus with peripheral neuropathy 09/12/2014  Quay Burow , OTR/L  01/26/2015, 5:39 PM  [facility] [address] [place], [place], [address] Phone: [phone]   Fax:  [phone]

## 2015-01-27 ENCOUNTER — Ambulatory Visit: Payer: 59 | Admitting: Occupational Therapy

## 2015-01-31 ENCOUNTER — Ambulatory Visit: Payer: 59 | Admitting: Occupational Therapy

## 2015-01-31 ENCOUNTER — Ambulatory Visit: Payer: 59 | Admitting: Physical Therapy

## 2015-01-31 DIAGNOSIS — G8194 Hemiplegia, unspecified affecting left nondominant side: Secondary | ICD-10-CM

## 2015-01-31 DIAGNOSIS — R414 Neurologic neglect syndrome: Secondary | ICD-10-CM

## 2015-01-31 DIAGNOSIS — R4189 Other symptoms and signs involving cognitive functions and awareness: Secondary | ICD-10-CM

## 2015-01-31 DIAGNOSIS — R269 Unspecified abnormalities of gait and mobility: Secondary | ICD-10-CM

## 2015-01-31 DIAGNOSIS — Z7409 Other reduced mobility: Secondary | ICD-10-CM

## 2015-01-31 DIAGNOSIS — I6931 Cognitive deficits following cerebral infarction: Secondary | ICD-10-CM | POA: Diagnosis not present

## 2015-01-31 NOTE — Therapy (Signed)
[facility] [address] [place], [place], [address] Phone: [phone]   Fax:  [phone]  Physical Therapy Treatment  Patient Details  Name: Brian Hall MRN: [id number] Date of Birth: 08/22/1956 Referring Provider:  Claretta Fraise, MD  Encounter Date: 01/31/2015      PT End of Session - 01/31/15 1216    Visit Number 11   Number of Visits 17   Date for PT Re-Evaluation 02/10/15   PT Start Time 1104   PT Stop Time 1144   PT Time Calculation (min) 40 min   Equipment Utilized During Treatment Gait belt   Activity Tolerance Patient tolerated treatment well   Behavior During Therapy [facility] for tasks assessed/performed      Past Medical History  Diagnosis Date  . Hypertension   . Diabetes mellitus   . Obesity   . CHF (congestive heart failure)   . SOB (shortness of breath)   . Hyperkalemia   . Renal insufficiency   . Cardiogenic shock   . Cardiac LV ejection fraction 10-20%   . Hyperlipemia   . Hypothyroidism   . Throat cancer 2011    s/p neck dissection, radiation, chemo  . Stroke   . Blurred vision   . Joint pain   . Joint swelling     Past Surgical History  Procedure Laterality Date  . Cardiac catheterization    . Neck dissection Right 2011    s/p resection of throat cancer with multiple nodes removed    There were no vitals filed for this visit.  Visit Diagnosis:  Impaired functional mobility and activity tolerance  Hemiplegia affecting left nondominant side  Abnormality of gait      Subjective Assessment - 01/31/15 1109    Subjective Pt reports no falls. Per spouse, pt has been attempting basic/level transfers without spouse present. Pt/spouse report increased stability with quad cane (in therapy) as opposed to hemi walker (at home).   Patient is accompained by: Family member   Currently in Pain? --   Pain Score 3    Pain Location Ankle   Pain Orientation Left   Pain Descriptors /  Indicators Burning   Pain Type Chronic pain   Pain Onset More than a month ago   Pain Frequency Constant   Aggravating Factors  Pt reports increased L ankle pain with immobility.   Pain Relieving Factors Movement, mobility.   Multiple Pain Sites No     Treatment:   Theract: - Blocked practice of squat pivot transfers from mat table <> w/c with cueing for locking w/c breaks (with effective within-session carryover), setup, and removal of w/c arm rest. Verbally recommended spouse remain arms-length away for all stand pivot transfers to prevent risk of falling. Pt/spouse verbally agreed.  NMR:  - Blocked practice of sit<>stand from EOM without UE support; focused on select ranges for increased motor control in L knee. Multimodal cueing for setup, symmetrical alignment/weightbearing; tactile cueing at L anterior shoulder, R ribcage for upright posture, erect trunk flexion. - R toe taps without UE support 2 x10 reps, then x5 reps on 1.5" step with CGA to min A for stability; multimodal cueing for lateral weight shift to L side, L stance stability.  Gait Training: - Gait [phone]' total over level surfaces with CGA to min A fo stability for majority of trial, mod A at most; verbal/tactile cueing L anterior shoulder/axilla for upright posture, at R gluteus maximus/medius during LLE advancement to prevent LLE adduction. Verbal cueing  also provided for lateral weight shift to L side, L stance stability, and decreased gait velocity for improved quality of movement.        PT Short Term Goals - 01/19/15 1126    PT SHORT TERM GOAL #1   Title Pt will perform HEP with supervision of family, for improved strength and balance. (Target 01/10/15)   Time 4   Period Weeks   Status Achieved   PT SHORT TERM GOAL #2   Title Pt will improve Timed UP and Go score to less than or equal to 35 seconds for decreased fall risk.   Baseline eval-46.78 sec   Time 4   Period Weeks   Status Not Met  46.81 with quad  cane and close supervision and occasional CGA   PT SHORT TERM GOAL #3   Title Berg Balance score to be assessed, with pt scoring at least 5 additional points to decrease fall risk.   Time 4   Period Weeks   Status Achieved  Scored 33 today   PT SHORT TERM GOAL #4   Title Pt will perform at least 6 of 10 reps of sit<>stand transfers no UE support, with equal weightbearing through bilateral lower extremities, independently.   Time 4   Period Weeks   Status Achieved           PT Long Term Goals - 12/14/14 1310    PT LONG TERM GOAL #1   Title Pt will verbalize understanding of fall prevention within home environment. Target 02/10/15   Time 8   Period Weeks   Status New   PT LONG TERM GOAL #2   Title Pt will improve TUG to less than or equal to 25 seconds for decreased fall risk.   Time 8   Period Weeks   Status New   PT LONG TERM GOAL #3   Title Pt will improve gait velocity to at least 1 ft/sec for improved gait efficiency and safety.   Time 8   Period Weeks   Status New   PT LONG TERM GOAL #4   Title Pt will ambulate at least 50-100 ft using hemiwalker, household distances, with supervision.   Time 8   Status New      Problem List Patient Active Problem List   Diagnosis Date Noted  . Spastic hemiparesis affecting nondominant side 11/08/2014  . Hypothyroidism 10/27/2014  . Left-sided neglect 09/23/2014  . Acute ischemic right MCA stroke 09/16/2014  . Left hemiparesis   . CVA (cerebral vascular accident)   . Right carotid artery occlusion   . Acute right MCA stroke   . Essential hypertension 09/12/2014  . CVA (cerebral infarction) 09/12/2014  . Type 2 diabetes mellitus with peripheral neuropathy 09/12/2014    Billie Ruddy, PT, DPT [facility] [address] [place], [place], [address] Phone: [phone]   Fax:  [phone] 01/31/2015, 12:34 PM

## 2015-02-01 NOTE — Therapy (Signed)
[facility] [address] [place], [place], [address] Phone: [phone]   Fax:  [phone]  Occupational Therapy Treatment  Patient Details  Name: Brian Hall MRN: [id number] Date of Birth: [birth year] Referring Provider:  Claretta Fraise, MD  Encounter Date: 01/31/2015      OT End of Session - 02/01/15 1147    Visit Number 12   Number of Visits 15   Date for OT Re-Evaluation 02/15/15   Authorization Type [employer]   OT Start Time 1151   OT Stop Time 1230   OT Time Calculation (min) 39 min   Activity Tolerance Patient tolerated treatment well   Behavior During Therapy [facility] for tasks assessed/performed      Past Medical History  Diagnosis Date  . Hypertension   . Diabetes mellitus   . Obesity   . CHF (congestive heart failure)   . SOB (shortness of breath)   . Hyperkalemia   . Renal insufficiency   . Cardiogenic shock   . Cardiac LV ejection fraction 10-20%   . Hyperlipemia   . Hypothyroidism   . Throat cancer 2011    s/p neck dissection, radiation, chemo  . Stroke   . Blurred vision   . Joint pain   . Joint swelling     Past Surgical History  Procedure Laterality Date  . Cardiac catheterization    . Neck dissection Right 2011    s/p resection of throat cancer with multiple nodes removed    There were no vitals filed for this visit.  Visit Diagnosis:  Impaired functional mobility and activity tolerance  Impaired cognition  Left-sided neglect  Hemiplegia affecting left nondominant side      Subjective Assessment - 01/31/15 1149    Subjective  Pt reports wrist pain after walking   Patient is accompained by: Family member   Pertinent History see epic snapshot   Patient Stated Goals Get back to doing some of the things I used to do.   Currently in Pain? Yes   Pain Score 4    Pain Location Wrist   Pain Orientation Left   Pain Descriptors / Indicators Aching   Pain Type Chronic pain   Pain Onset More than a month ago   Pain Frequency Intermittent   Aggravating Factors  moving wrist    Pain Relieving Factors wearing splint, rest, not moving it   Multiple Pain Sites No                      OT Treatments/Exercises (OP) - 02/01/15 0001    Neurological Re-education Exercises   Other Exercises 1 Neuro re-ed for shoulder flexion and chest press in supine with PVC pipe frame, min-mod facillitation.Seated therapist facilitated shoulder flexion with UE Ranger, mod facillitation. While using NMES on left forearm pt performed towel slide for shoulder  flexion during on cycle followed by gentle grasp /release of cones with mod/ max facilitation.   Modalities   Modalities Electrical Stimulation  Pt responded well, wrist pain was alleviated by reduced intensity, repositioning.   Sales promotion account executive Action wrist  and finger extension   Electrical Stimulation Parameters 250 pw, 50 pps, 10 sec cycle, 2 sec ramp, intensity 35-50    Electrical Stimulation Goals Neuromuscular facilitation  Pt reports wrist pain, therapist reduced intensity/position    Manual Therapy   Manual Therapy Joint mobilization;Soft tissue mobilization   Manual therapy comments at  wrist along with gentle P/ROM wrist flexion/ extension followed by gentle weightbearing.                  OT Short Term Goals - 01/26/15 1737    OT SHORT TERM GOAL #1   Title Pt and wife will be mod I with HEP - 01/18/2015.  Goal dates adjusted as pt will not return to clinic prior to 12/21/14   Status Achieved   OT SHORT TERM GOAL #2   Title Pt will require min a for LB dressing   Status Achieved   OT SHORT TERM GOAL #3   Title Pt will demonstrate ability to use LUE as gross assist during basic self care 50% of the time.   Status Partially Met   OT SHORT TERM GOAL #4   Title Pt will need no more than min vc's to compensate for L  neglect during functional mobility tasks   Status Achieved   OT SHORT TERM GOAL #5   Title Pt will demonstrate ability to attend to familiar functional task for 15 minute with no more than one vc.   Status Achieved   OT SHORT TERM GOAL #6   Title Pt will require no more than min assist for toilet transfers   Status Achieved           OT Long Term Goals - 01/26/15 1737    OT LONG TERM GOAL #1   Title Pt and wife will be mod I with upgraded HEP - 02/15/2015. Goal date adjusted as pt will not return to clinic prior to 12/21/2014   Status On-going   OT LONG TERM GOAL #2   Title Pt will demonstrate ability to use LUE as gross assist 100% of the time in basic self care tasks   Status On-going   OT LONG TERM GOAL #3   Title Pt will demonstrate ability to hold cylindrical object in L hand.    Status On-going   OT LONG TERM GOAL #4   Title Pt will verbalize understanding of AE for buttoning, zipping and cutting   Status On-going   OT LONG TERM GOAL #5   Title Pt will be supervision for toilet transfers   Status On-going   OT LONG TERM GOAL #6   Title Pt will be min a for shower transfers   Status On-going   OT LONG TERM GOAL #7   Title Pt will  be supervision for simple hot meal prep   Status On-going               Plan - 02/01/15 1145    Clinical Impression Statement Pt is progressing towards goals for functional use of LUE and mobility.   Rehab Potential Good   Clinical Impairments Affecting Rehab Potential Pt will benefit from skilled OT to address deficits outlined in eval to maximize independence.   OT Frequency 2x / week   OT Duration 8 weeks   OT Treatment/Interventions Self-care/ADL training;Cryotherapy;Moist Heat;Electrical Stimulation;Fluidtherapy;DME and/or AE instruction;Energy conservation;Neuromuscular education;Therapeutic exercise;Functional Mobility Training;Manual Therapy;Passive range of motion;Splinting;Therapeutic activities;Balance  training;Patient/family education;Cognitive remediation/compensation   Plan neuro re-ed for LUE and balance   OT Home Exercise Plan issued inital HEP-12/27/14, cane shoulder flexion 01/03/15   Consulted and Agree with Plan of Care Patient;Family member/caregiver   Family Member Consulted wife        Problem List Patient Active Problem List   Diagnosis Date Noted  . Spastic hemiparesis affecting nondominant side 11/08/2014  . Hypothyroidism 10/27/2014  .  Left-sided neglect 09/23/2014  . Acute ischemic right MCA stroke 09/16/2014  . Left hemiparesis   . CVA (cerebral vascular accident)   . Right carotid artery occlusion   . Acute right MCA stroke   . Essential hypertension 09/12/2014  . CVA (cerebral infarction) 09/12/2014  . Type 2 diabetes mellitus with peripheral neuropathy 09/12/2014    RINE,KATHRYN 02/01/2015, 1:05 PM Theone Murdoch, OTR/L [phone] Phone: [phone] 1:05 PM 02/01/2015 [facility] [address] [place], [place], [address] Phone: [phone]   Fax:  [phone]

## 2015-02-02 ENCOUNTER — Ambulatory Visit: Payer: 59 | Attending: Physical Medicine & Rehabilitation | Admitting: Occupational Therapy

## 2015-02-02 ENCOUNTER — Ambulatory Visit: Payer: 59 | Admitting: Physical Therapy

## 2015-02-02 DIAGNOSIS — G8110 Spastic hemiplegia affecting unspecified side: Secondary | ICD-10-CM | POA: Diagnosis present

## 2015-02-02 DIAGNOSIS — Z7409 Other reduced mobility: Secondary | ICD-10-CM | POA: Diagnosis present

## 2015-02-02 DIAGNOSIS — R201 Hypoesthesia of skin: Secondary | ICD-10-CM | POA: Insufficient documentation

## 2015-02-02 DIAGNOSIS — R269 Unspecified abnormalities of gait and mobility: Secondary | ICD-10-CM | POA: Diagnosis present

## 2015-02-02 DIAGNOSIS — R4189 Other symptoms and signs involving cognitive functions and awareness: Secondary | ICD-10-CM | POA: Insufficient documentation

## 2015-02-02 DIAGNOSIS — R414 Neurologic neglect syndrome: Secondary | ICD-10-CM

## 2015-02-02 DIAGNOSIS — R278 Other lack of coordination: Secondary | ICD-10-CM | POA: Insufficient documentation

## 2015-02-02 DIAGNOSIS — G8194 Hemiplegia, unspecified affecting left nondominant side: Secondary | ICD-10-CM | POA: Diagnosis present

## 2015-02-02 NOTE — Therapy (Signed)
[facility] [address] [place], [place], [address] Phone: [phone]   Fax:  [phone]  Occupational Therapy Treatment  Patient Details  Name: Brian Hall MRN: [id number] Date of Birth: 02-27-1956 Referring Provider:  Claretta Fraise, MD  Encounter Date: 02/02/2015      OT End of Session - 02/02/15 1555    Visit Number 13   Number of Visits 15   Date for OT Re-Evaluation 02/15/15   Authorization Type [employer]   OT Start Time 5093   OT Stop Time 1615   OT Time Calculation (min) 40 min   Activity Tolerance Patient tolerated treatment well   Behavior During Therapy [facility] for tasks assessed/performed      Past Medical History  Diagnosis Date  . Hypertension   . Diabetes mellitus   . Obesity   . CHF (congestive heart failure)   . SOB (shortness of breath)   . Hyperkalemia   . Renal insufficiency   . Cardiogenic shock   . Cardiac LV ejection fraction 10-20%   . Hyperlipemia   . Hypothyroidism   . Throat cancer 2011    s/p neck dissection, radiation, chemo  . Stroke   . Blurred vision   . Joint pain   . Joint swelling     Past Surgical History  Procedure Laterality Date  . Cardiac catheterization    . Neck dissection Right 2011    s/p resection of throat cancer with multiple nodes removed    There were no vitals filed for this visit.  Visit Diagnosis:  Hemiplegia affecting left nondominant side  Left-sided neglect  Impaired sensation  Impaired functional mobility and activity tolerance      Subjective Assessment - 02/02/15 1658    Patient is accompained by: Family member   Pertinent History see epic snapshot   Patient Stated Goals Get back to doing some of the things I used to do.   Currently in Pain? Yes   Pain Score 4    Pain Location Wrist   Pain Orientation Left   Pain Descriptors / Indicators Aching   Pain Type Chronic pain   Pain Onset More than a month ago   Pain Frequency  Intermittent   Aggravating Factors  malpositioning   Pain Relieving Factors repositioning   Multiple Pain Sites No                      OT Treatments/Exercises (OP) - 02/02/15 0001    Neurological Re-education Exercises   Other Exercises 1 Gentle weightbearing through LUE in standing over towel roll with lateral weight shifts, functional reaching with RUE, mod facillitation/ v.c.Functional grasp/ release of cylindrical objects with LUE in seated and standing with and without NMES facilitating movments, mod facillitation from therapist., Improved finger extension.    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 forearm/ wrist   Electrical Stimulation Action finger extension   Electrical Stimulation Parameters 250 pw, 50 pps, 10 sec cycle intensity 31   Electrical Stimulation Goals Neuromuscular facilitation  Adjusted to facilitate fingers more, not wrist   Manual Therapy   Manual Therapy Joint mobilization;Soft tissue mobilization   Manual therapy comments at wrist along with gentle P/ROM wrist flexion/ extension followed by gentle weightbearing.                  OT Short Term Goals - 01/26/15 1737    OT SHORT TERM GOAL #1  Title Pt and wife will be mod I with HEP - 01/18/2015.  Goal dates adjusted as pt will not return to clinic prior to 12/21/14   Status Achieved   OT SHORT TERM GOAL #2   Title Pt will require min a for LB dressing   Status Achieved   OT SHORT TERM GOAL #3   Title Pt will demonstrate ability to use LUE as gross assist during basic self care 50% of the time.   Status Partially Met   OT SHORT TERM GOAL #4   Title Pt will need no more than min vc's to compensate for L neglect during functional mobility tasks   Status Achieved   OT SHORT TERM GOAL #5   Title Pt will demonstrate ability to attend to familiar functional task for 15 minute with no more than one vc.   Status Achieved    OT SHORT TERM GOAL #6   Title Pt will require no more than min assist for toilet transfers   Status Achieved           OT Long Term Goals - 01/26/15 1737    OT LONG TERM GOAL #1   Title Pt and wife will be mod I with upgraded HEP - 02/15/2015. Goal date adjusted as pt will not return to clinic prior to 12/21/2014   Status On-going   OT LONG TERM GOAL #2   Title Pt will demonstrate ability to use LUE as gross assist 100% of the time in basic self care tasks   Status On-going   OT LONG TERM GOAL #3   Title Pt will demonstrate ability to hold cylindrical object in L hand.    Status On-going   OT LONG TERM GOAL #4   Title Pt will verbalize understanding of AE for buttoning, zipping and cutting   Status On-going   OT LONG TERM GOAL #5   Title Pt will be supervision for toilet transfers   Status On-going   OT LONG TERM GOAL #6   Title Pt will be min a for shower transfers   Status On-going   OT LONG TERM GOAL #7   Title Pt will  be supervision for simple hot meal prep   Status On-going               Plan - 02/02/15 1637    Clinical Impression Statement Pt is progressing towards goals for mobility and LUE functional grasp/ release.   Rehab Potential Good   Clinical Impairments Affecting Rehab Potential Pt will benefit from skilled OT to address deficits outlined in eval to maximize independence.   OT Frequency 2x / week   OT Duration 8 weeks   OT Treatment/Interventions Self-care/ADL training;Cryotherapy;Moist Heat;Electrical Stimulation;Fluidtherapy;DME and/or AE instruction;Energy conservation;Neuromuscular education;Therapeutic exercise;Functional Mobility Training;Manual Therapy;Passive range of motion;Splinting;Therapeutic activities;Balance training;Patient/family education;Cognitive remediation/compensation   Plan neuro re-ed for LUE/ standing balance   OT Home Exercise Plan issued inital HEP-12/27/14, cane shoulder flexion 01/03/15   Consulted and Agree with Plan of  Care Patient   Family Member Consulted wife        Problem List Patient Active Problem List   Diagnosis Date Noted  . Spastic hemiparesis affecting nondominant side 11/08/2014  . Hypothyroidism 10/27/2014  . Left-sided neglect 09/23/2014  . Acute ischemic right MCA stroke 09/16/2014  . Left hemiparesis   . CVA (cerebral vascular accident)   . Right carotid artery occlusion   . Acute right MCA stroke   . Essential hypertension 09/12/2014  . CVA (cerebral  infarction) 09/12/2014  . Type 2 diabetes mellitus with peripheral neuropathy 09/12/2014    Bronnie Vasseur 02/02/2015, 5:02 PM Theone Murdoch, OTR/L [phone] Phone: [phone] 5:02 PM 02/02/2015 [facility] [address] [place], [place], [address] Phone: [phone]   Fax:  [phone]

## 2015-02-02 NOTE — Therapy (Signed)
[facility] [address] [place], [place], [address] Phone: [phone]   Fax:  [phone]  Physical Therapy Treatment  Patient Details  Name: DAREON NUNZIATO MRN: [id number] Date of Birth: 1956/04/17 Referring Provider:  Claretta Fraise, MD  Encounter Date: 02/02/2015      PT End of Session - 02/02/15 1658    Visit Number 12   Number of Visits 17   Date for PT Re-Evaluation 02/10/15   PT Start Time 8832   PT Stop Time 1529   PT Time Calculation (min) 44 min   Equipment Utilized During Treatment Gait belt   Activity Tolerance Patient tolerated treatment well   Behavior During Therapy [facility] for tasks assessed/performed      Past Medical History  Diagnosis Date  . Hypertension   . Diabetes mellitus   . Obesity   . CHF (congestive heart failure)   . SOB (shortness of breath)   . Hyperkalemia   . Renal insufficiency   . Cardiogenic shock   . Cardiac LV ejection fraction 10-20%   . Hyperlipemia   . Hypothyroidism   . Throat cancer 2011    s/p neck dissection, radiation, chemo  . Stroke   . Blurred vision   . Joint pain   . Joint swelling     Past Surgical History  Procedure Laterality Date  . Cardiac catheterization    . Neck dissection Right 2011    s/p resection of throat cancer with multiple nodes removed    There were no vitals filed for this visit.  Visit Diagnosis:  Abnormality of gait  Impaired sensation  Impaired functional mobility and activity tolerance  Hemiplegia affecting left nondominant side      Subjective Assessment - 02/02/15 1449    Subjective pt reports he is performing sit to stand transfers at home most of the time independently wihtout loss of balanace and is wife present. No falls to report.    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   He has a motorcycle and would like to ride it again.   Currently in Pain? Yes   Pain Score 4    Pain Type Chronic pain       Treatment  Gait Training:  - Gait x100' total over level surfaces without AD and with CGA to min A for stability. Verbal and tactile cueing at L anterior shoulder/axilla for upright posture, at R gluteus maximus/medius during LLE advancement to prevent LLE adduction. Verbal cueing also provided for lateral weight shift to L side, L stance stability, and decreased gait velocity for improved quality of movement.    Neuromuscular Reed:  -LLE SLS with RLE repeated tapping in front with CGA/MIN A to increase L weight shift, with mirror for improved awareness of body position in L weight shift. Emphasis on maintaining upright posture with shoulder, hip, knee, ankle aligned to decrease loss of balance toward R (performed with RUE support on back of chair). -squat pivot transfers from mat table <>w/c -Sitting on top the round side of BOSU ball place on top of mat, pt practiced maintaining balance with cues for hip hiking motion to R and L for core activation. Upright posture and anterior pelvic tilt emphasized. Pt then performed 8 Sit<>Stands from Bosu ball with tactile and verbal cues for equal LE weightbearing when lowering and  rising and upright posture in stance. Pt then practiced 3 sit<>stands with 1 inch block placed under R LE to promote LLE weightbearing during movement.  -10 reps of Sidelying clamshells to increase motor awareness of hip abduction activation lying on right side with cues for proper positioning and assistance to maintain ankles together.              PT Education - 02/02/15 1656    Education provided Yes   Education Details Clamshells added to HEP   Person(s) Educated Patient   Methods Explanation;Demonstration   Comprehension Verbalized understanding          PT Short Term Goals - 01/19/15 1126    PT SHORT TERM GOAL #1   Title Pt will  perform HEP with supervision of family, for improved strength and balance. (Target 01/10/15)   Time 4   Period Weeks   Status Achieved   PT SHORT TERM GOAL #2   Title Pt will improve Timed UP and Go score to less than or equal to 35 seconds for decreased fall risk.   Baseline eval-46.78 sec   Time 4   Period Weeks   Status Not Met  46.81 with quad cane and close supervision and occasional CGA   PT SHORT TERM GOAL #3   Title Berg Balance score to be assessed, with pt scoring at least 5 additional points to decrease fall risk.   Time 4   Period Weeks   Status Achieved  Scored 33 today   PT SHORT TERM GOAL #4   Title Pt will perform at least 6 of 10 reps of sit<>stand transfers no UE support, with equal weightbearing through bilateral lower extremities, independently.   Time 4   Period Weeks   Status Achieved           PT Long Term Goals - 12/14/14 1310    PT LONG TERM GOAL #1   Title Pt will verbalize understanding of fall prevention within home environment. Target 02/10/15   Time 8   Period Weeks   Status New   PT LONG TERM GOAL #2   Title Pt will improve TUG to less than or equal to 25 seconds for decreased fall risk.   Time 8   Period Weeks   Status New   PT LONG TERM GOAL #3   Title Pt will improve gait velocity to at least 1 ft/sec for improved gait efficiency and safety.   Time 8   Period Weeks   Status New   PT LONG TERM GOAL #4   Title Pt will ambulate at least 50-100 ft using hemiwalker, household distances, with supervision.   Time 8   Status New               Plan - 02/02/15 1700    Clinical Impression Statement Today's session emphasized hip strengthening acitivites required for efficent walking gait. pt continues to demonstrate unsteadiness when walking without AD and requring min-mod A to prevent loss of balance. pt demonstrated improved L LE weightbearing when performing sit<>stand activites from varying surfaces.    Pt will benefit from skilled  therapeutic intervention in order to improve on the following deficits Abnormal gait;Decreased activity tolerance;Decreased balance;Decreased mobility;Decreased range of motion;Decreased safety awareness;Difficulty walking;Decreased strength;Impaired tone   Rehab Potential Good   PT Frequency 2x / week   PT Duration 8 weeks   PT Treatment/Interventions ADLs/Self Care Home Management;Therapeutic activities;Functional mobility training;Stair training;Gait training;Therapeutic exercise;Balance training;Neuromuscular re-education;Patient/family education   PT Next  Visit Plan Continue LE and trunk strengthening required for functional activities and gait. Reinforce upright posture during transitional movemens and gait. NMR for L knee control, activation of L anterior tib.   Consulted and Agree with Plan of Care Patient   Family Member Consulted wife        Problem List Patient Active Problem List   Diagnosis Date Noted  . Spastic hemiparesis affecting nondominant side 11/08/2014  . Hypothyroidism 10/27/2014  . Left-sided neglect 09/23/2014  . Acute ischemic right MCA stroke 09/16/2014  . Left hemiparesis   . CVA (cerebral vascular accident)   . Right carotid artery occlusion   . Acute right MCA stroke   . Essential hypertension 09/12/2014  . CVA (cerebral infarction) 09/12/2014  . Type 2 diabetes mellitus with peripheral neuropathy 09/12/2014    Fanny Dance 02/02/2015, 5:08 PM  [facility] [address] [place], [place], [address] Phone: [phone]   Fax:  [phone]

## 2015-02-06 ENCOUNTER — Encounter: Payer: Self-pay | Admitting: Physical Therapy

## 2015-02-06 ENCOUNTER — Ambulatory Visit: Payer: 59 | Admitting: Occupational Therapy

## 2015-02-06 ENCOUNTER — Ambulatory Visit: Payer: 59 | Admitting: Physical Therapy

## 2015-02-06 ENCOUNTER — Encounter: Payer: Self-pay | Admitting: Occupational Therapy

## 2015-02-06 DIAGNOSIS — G8194 Hemiplegia, unspecified affecting left nondominant side: Secondary | ICD-10-CM

## 2015-02-06 DIAGNOSIS — R269 Unspecified abnormalities of gait and mobility: Secondary | ICD-10-CM

## 2015-02-06 DIAGNOSIS — R201 Hypoesthesia of skin: Secondary | ICD-10-CM

## 2015-02-06 DIAGNOSIS — Z7409 Other reduced mobility: Secondary | ICD-10-CM

## 2015-02-06 NOTE — Therapy (Signed)
Brian Hall [address] [place], [place], [address] Phone: [phone]   Fax:  [phone]  Physical Therapy Treatment  Patient Details  Name: Brian Hall MRN: [id number] Date of Birth: [birth year] Referring Provider:  Claretta Fraise, MD  Encounter Date: 02/06/2015      PT End of Session - 02/06/15 1107    Visit Number 13   Number of Visits 17   Date for PT Re-Evaluation 02/10/15   PT Start Time 1100   PT Stop Time 1145   PT Time Calculation (min) 45 min   Equipment Utilized During Treatment Gait belt   Activity Tolerance Patient tolerated treatment well   Behavior During Therapy [facility] for tasks assessed/performed      Past Medical History  Diagnosis Date  . Hypertension   . Diabetes mellitus   . Obesity   . CHF (congestive heart failure)   . SOB (shortness of breath)   . Hyperkalemia   . Renal insufficiency   . Cardiogenic shock   . Cardiac LV ejection fraction 10-20%   . Hyperlipemia   . Hypothyroidism   . Throat cancer 2011    s/p neck dissection, radiation, chemo  . Stroke   . Blurred vision   . Joint pain   . Joint swelling     Past Surgical History  Procedure Laterality Date  . Cardiac catheterization    . Neck dissection Right 2011    s/p resection of throat cancer with multiple nodes removed    There were no vitals filed for this visit.  Visit Diagnosis:  Abnormality of gait  Hemiplegia affecting left nondominant side  Impaired functional mobility and activity tolerance      Subjective Assessment - 02/06/15 1105    Subjective No new complaints. No falls to report. No changes in pain.   Currently in Pain? Yes   Pain Score 5    Pain Location Arm   Pain Orientation Left   Pain Descriptors / Indicators Aching;Tingling   Pain Type Chronic pain   Pain Onset More than a month ago   Pain Frequency Intermittent   Aggravating Factors  malpositioning   Pain Relieving Factors  repositioning          [facility] Adult PT Treatment/Exercise - 02/06/15 1118    Ambulation/Gait   Ambulation/Gait Yes   Ambulation/Gait Assistance 5: Supervision   Ambulation/Gait Assistance Details supervision, cues on hemiwalker placement with gait to prevent pt from kicking it with leg advancement.               Ambulation Distance (Feet) 145 Feet   Assistive device Hemi-walker   Gait Pattern Step-to pattern;Step-through pattern;Decreased stride length   Ambulation Surface Level;Indoor   Gait velocity 32.16 sec's= 1.02 ft/sec with hemiwalker and min assist for stability   Timed Up and Go Test   TUG Normal TUG   Normal TUG (seconds) 29.69  with large based quad cane     Neuro Re-ed: No UE support with min assist Left stance with guarding to prevent buckling/hyperextension of knee: - right foot taps to 4 inch box x 10, to 6 inch box x 10 - right foot step over and back (using half foam roll) x 10 reps - right foot on tennis balls for rolling fwd/bwd and circles both ways.        PT Short Term Goals - 01/19/15 1126    PT SHORT TERM GOAL #1   Title Pt will perform HEP with supervision  of family, for improved strength and balance. (Target 01/10/15)   Time 4   Period Weeks   Status Achieved   PT SHORT TERM GOAL #2   Title Pt will improve Timed UP and Go score to less than or equal to 35 seconds for decreased fall risk.   Baseline eval-46.78 sec   Time 4   Period Weeks   Status Not Met  46.81 with quad cane and close supervision and occasional CGA   PT SHORT TERM GOAL #3   Title Berg Balance score to be assessed, with pt scoring at least 5 additional points to decrease fall risk.   Time 4   Period Weeks   Status Achieved  Scored 33 today   PT SHORT TERM GOAL #4   Title Pt will perform at least 6 of 10 reps of sit<>stand transfers no UE support, with equal weightbearing through bilateral lower extremities, independently.   Time 4   Period Weeks   Status Achieved            PT Long Term Goals - 02/06/15 1108    PT LONG TERM GOAL #1   Title Pt will verbalize understanding of fall prevention within home environment. Target 02/10/15   Time --   Period --   Status Achieved   PT LONG TERM GOAL #2   Title Pt will improve TUG to less than or equal to 25 seconds for decreased fall risk.   Time --   Period --   Status Not Met   PT LONG TERM GOAL #3   Title Pt will improve gait velocity to at least 1 ft/sec for improved gait efficiency and safety.   Time --   Period --   Status Achieved   PT LONG TERM GOAL #4   Title Pt will ambulate at least 50-100 ft using hemiwalker, household distances, with supervision.   Time --   Status Achieved           Plan - 02/06/15 1107    Clinical Impression Statement Pt has met some goals and partially met others. After cues to slow down with gait pt with improved balance.    Pt will benefit from skilled therapeutic intervention in order to improve on the following deficits Abnormal gait;Decreased activity tolerance;Decreased balance;Decreased mobility;Decreased range of motion;Decreased safety awareness;Difficulty walking;Decreased strength;Impaired tone   Rehab Potential Good   PT Frequency 2x / week   PT Duration 8 weeks   PT Treatment/Interventions ADLs/Self Care Home Management;Therapeutic activities;Functional mobility training;Stair training;Gait training;Therapeutic exercise;Balance training;Neuromuscular re-education;Patient/family education   PT Next Visit Plan assess remaining goals.   Consulted and Agree with Plan of Care Patient   Family Member Consulted wife        Problem List Patient Active Problem List   Diagnosis Date Noted  . Spastic hemiparesis affecting nondominant side 11/08/2014  . Hypothyroidism 10/27/2014  . Left-sided neglect 09/23/2014  . Acute ischemic right MCA stroke 09/16/2014  . Left hemiparesis   . CVA (cerebral vascular accident)   . Right carotid artery occlusion   .  Acute right MCA stroke   . Essential hypertension 09/12/2014  . CVA (cerebral infarction) 09/12/2014  . Type 2 diabetes mellitus with peripheral neuropathy 09/12/2014    Brian Hall 02/06/2015, 4:24 PM  Brian Hall, PTA, [place] [address], [place], [place] [address] [phone] 02/06/2015, 4:24 PM

## 2015-02-06 NOTE — Therapy (Signed)
[facility] [address] [place], [place], [address] Phone: [phone]   Fax:  [phone]  Occupational Therapy Treatment  Patient Details  Name: Brian Hall MRN: [id number] Date of Birth: October 15, 1955 Referring Provider:  Claretta Fraise, MD  Encounter Date: 02/06/2015      OT End of Session - 02/06/15 1257    Visit Number 14   Number of Visits 15   Date for OT Re-Evaluation 02/15/15   Authorization Type [employer]   OT Start Time 1146   OT Stop Time 1231   OT Time Calculation (min) 45 min   Activity Tolerance Patient tolerated treatment well      Past Medical History  Diagnosis Date  . Hypertension   . Diabetes mellitus   . Obesity   . CHF (congestive heart failure)   . SOB (shortness of breath)   . Hyperkalemia   . Renal insufficiency   . Cardiogenic shock   . Cardiac LV ejection fraction 10-20%   . Hyperlipemia   . Hypothyroidism   . Throat cancer 2011    s/p neck dissection, radiation, chemo  . Stroke   . Blurred vision   . Joint pain   . Joint swelling     Past Surgical History  Procedure Laterality Date  . Cardiac catheterization    . Neck dissection Right 2011    s/p resection of throat cancer with multiple nodes removed    There were no vitals filed for this visit.  Visit Diagnosis:  Impaired sensation  Impaired functional mobility and activity tolerance  Hemiplegia affecting left nondominant side      Subjective Assessment - 02/06/15 1148    Subjective  My wrist still hurts    Patient is accompained by: Family member  wife   Pertinent History see epic snapshot   Patient Stated Goals Get back to doing some of the things I used to do.   Currently in Pain? Yes   Pain Score 2    Pain Location Wrist   Pain Orientation Left   Pain Descriptors / Indicators Throbbing   Pain Type Chronic pain   Pain Onset More than a month ago   Pain Frequency Intermittent   Aggravating Factors   malpositioning   Pain Relieving Factors repositioning   Multiple Pain Sites Yes   Pain Score 2   Pain Location Arm   Pain Orientation Left   Pain Descriptors / Indicators Throbbing   Pain Type Acute pain   Pain Onset In the past 7 days   Pain Frequency Intermittent   Aggravating Factors  since last session when I did the estim   Pain Relieving Factors nothing                      OT Treatments/Exercises (OP) - 02/06/15 0001    Neurological Re-education Exercises   Other Exercises 1 Neuro re ed to address shoulder flexion, extension, elbow flexion/extension, scapular protraction and stabilization, grading movements, and working to reduce synergey influence with min facilitation in supine and sitting. Pt then transferred with vc's and supervision only using [facility]   Manual Therapy   Manual Therapy Joint mobilization   Manual therapy comments Joint mob to realign wrist..Pt with pain prior to wrist mobilization however no pain afterward.  Issued pt D ring splint to wear intermittently during the day to provide support to wrist. Pt and wife understand this is a temporary measure and also understand that it should only  be worn intermittently not all day.                   OT Short Term Goals - 02/06/15 1255    OT SHORT TERM GOAL #1   Title Pt and wife will be mod I with HEP - 01/18/2015.  Goal dates adjusted as pt will not return to clinic prior to 12/21/14   Status Achieved   OT SHORT TERM GOAL #2   Title Pt will require min a for LB dressing   Status Achieved   OT SHORT TERM GOAL #3   Title Pt will demonstrate ability to use LUE as gross assist during basic self care 50% of the time.   Status Partially Met   OT SHORT TERM GOAL #4   Title Pt will need no more than min vc's to compensate for L neglect during functional mobility tasks   Status Achieved   OT SHORT TERM GOAL #5   Title Pt will demonstrate ability to attend to familiar functional task for 15 minute  with no more than one vc.   Status Achieved   OT SHORT TERM GOAL #6   Title Pt will require no more than min assist for toilet transfers   Status Achieved           OT Long Term Goals - 02/06/15 [place] #1   Title Pt and wife will be mod I with upgraded HEP - 02/15/2015. Goal date adjusted as pt will not return to clinic prior to 12/21/2014   Status Achieved   OT LONG TERM GOAL #2   Title Pt will demonstrate ability to use LUE as gross assist 100% of the time in basic self care tasks   Status Not Met   OT LONG TERM GOAL #3   Title Pt will demonstrate ability to hold cylindrical object in L hand.    Status Achieved   OT LONG TERM GOAL #4   Title Pt will verbalize understanding of AE for buttoning, zipping and cutting   Status Achieved   OT LONG TERM GOAL #5   Title Pt will be supervision for toilet transfers   Status On-going   OT LONG TERM GOAL #6   Title Pt will be min a for shower transfers   Status Achieved   OT LONG TERM GOAL #7   Title Pt will  be supervision for simple hot meal prep   Status Not Met               Plan - 02/06/15 1255    Clinical Impression Statement Pt continues to make excellent gains toward goals. Pt with greatly improved safety awareness as well as awareness of body in space.  Pt also improving with ability to grade movement.   Rehab Potential Good   Clinical Impairments Affecting Rehab Potential Pt will benefit from skilled OT to address deficits outlined in eval to maximize independence.   OT Frequency 2x / week   OT Duration 8 weeks   OT Treatment/Interventions Self-care/ADL training;Cryotherapy;Moist Heat;Electrical Stimulation;Fluidtherapy;DME and/or AE instruction;Energy conservation;Neuromuscular education;Therapeutic exercise;Functional Mobility Training;Manual Therapy;Passive range of motion;Splinting;Therapeutic activities;Balance training;Patient/family education;Cognitive remediation/compensation   Plan neuro re  ed to LUE, standing balance, functional mobility, use of LUE   OT Home Exercise Plan issued inital HEP-12/27/14, cane shoulder flexion 01/03/15   Consulted and Agree with Plan of Care Patient   Family Member Consulted wife        Problem List Patient  Active Problem List   Diagnosis Date Noted  . Spastic hemiparesis affecting nondominant side 11/08/2014  . Hypothyroidism 10/27/2014  . Left-sided neglect 09/23/2014  . Acute ischemic right MCA stroke 09/16/2014  . Left hemiparesis   . CVA (cerebral vascular accident)   . Right carotid artery occlusion   . Acute right MCA stroke   . Essential hypertension 09/12/2014  . CVA (cerebral infarction) 09/12/2014  . Type 2 diabetes mellitus with peripheral neuropathy 09/12/2014    Quay Burow, OTR/L 02/06/2015, 1:03 PM  [facility] [address] [place], [place], [address] Phone: [phone]   Fax:  [phone]

## 2015-02-06 NOTE — Patient Instructions (Signed)

## 2015-02-07 ENCOUNTER — Ambulatory Visit: Payer: 59 | Admitting: Physical Therapy

## 2015-02-07 ENCOUNTER — Other Ambulatory Visit: Payer: Self-pay | Admitting: Physical Medicine & Rehabilitation

## 2015-02-07 DIAGNOSIS — G8194 Hemiplegia, unspecified affecting left nondominant side: Secondary | ICD-10-CM | POA: Diagnosis not present

## 2015-02-07 DIAGNOSIS — Z7409 Other reduced mobility: Secondary | ICD-10-CM

## 2015-02-07 DIAGNOSIS — R269 Unspecified abnormalities of gait and mobility: Secondary | ICD-10-CM

## 2015-02-07 NOTE — Therapy (Signed)
[facility] [address] [place], [place], [address] Phone: [phone]   Fax:  [phone]  Physical Therapy Treatment  Patient Details  Name: Brian Hall MRN: [id number] Date of Birth: 1956-01-29 Referring Provider:  Claretta Fraise, MD  Encounter Date: 02/07/2015      PT End of Session - 02/07/15 2022    Visit Number 14   Number of Visits 17   Date for PT Re-Evaluation 02/10/15   PT Start Time 0933   PT Stop Time 1021   PT Time Calculation (min) 48 min   Equipment Utilized During Treatment Gait belt   Activity Tolerance Patient tolerated treatment well   Behavior During Therapy [facility] for tasks assessed/performed      Past Medical History  Diagnosis Date  . Hypertension   . Diabetes mellitus   . Obesity   . CHF (congestive heart failure)   . SOB (shortness of breath)   . Hyperkalemia   . Renal insufficiency   . Cardiogenic shock   . Cardiac LV ejection fraction 10-20%   . Hyperlipemia   . Hypothyroidism   . Throat cancer 2011    s/p neck dissection, radiation, chemo  . Stroke   . Blurred vision   . Joint pain   . Joint swelling     Past Surgical History  Procedure Laterality Date  . Cardiac catheterization    . Neck dissection Right 2011    s/p resection of throat cancer with multiple nodes removed    There were no vitals filed for this visit.  Visit Diagnosis:  Abnormality of gait  Hemiplegia affecting left nondominant side  Impaired functional mobility and activity tolerance      Subjective Assessment - 02/07/15 0938    Subjective No new complaints. No falls to report.   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  He has a motorcycle and would like to ride it again.    Currently in Pain? Yes   Pain Score 5    Pain Location Ankle   Pain Orientation  Left   Pain Descriptors / Indicators Burning   Pain Type Chronic pain   Pain Onset More than a month ago   Pain Frequency Constant       Treatment: Gait Training -To determine safest and most appropriate assistive device, pt performed gait x115' with [facility] followed by gait x230' with SBQC. During both gait trials, pt required supervision to CGA and mulimodal cueing for upright posture, lateral weight shift to L side, and decreased gait speed to increase quality of movement. Noted that when using [facility], pt did have some difficulty placing cane in such a way that didn't interfere with RLE advancement/placement at times.  - To ensure pt safety negotiating bathroom with recommended AD, pt ambulated into/out of bathroom and transferred to/from toilet with [facility] and close supervision to min guard of wife with no overt LOB.  Due to improved gait stability with SQBC (as compared with hemi walker and LBQC), educated pt/wife on PT recommendation for [facility] with verbal understanding/agreement from pt/wife.  Self-care: See Patient Education section for details.         PT Education - 02/07/15 2021    Education provided Yes   Education Details PT goals, progress, POC; recommendation for [facility]   Person(s) Educated Patient;Spouse   Methods Explanation  Comprehension Other (comment)          PT Short Term Goals - 02/07/15 2028    PT SHORT TERM GOAL #1   Title Pt will perform HEP with supervision of family, for improved strength and balance. (Target 01/10/15)   Time 4   Period Weeks   Status Achieved   PT SHORT TERM GOAL #2   Title Pt will improve Timed UP and Go score to less than or equal to 35 seconds for decreased fall risk.   Baseline eval-46.78 sec   Time 4   Period Weeks   Status Not Met  46.81 with quad cane and close supervision and occasional CGA   PT SHORT TERM GOAL #3   Title Berg Balance score to be assessed, with pt scoring at least 5 additional points to decrease fall risk.    Time 4   Period Weeks   Status Achieved  Scored 33 today   PT SHORT TERM GOAL #4   Title Pt will perform at least 6 of 10 reps of sit<>stand transfers no UE support, with equal weightbearing through bilateral lower extremities, independently.   Time 4   Period Weeks   Status Achieved   PT SHORT TERM GOAL #5   Title Pt will improve TUG time from 29.69 sec to 25 seconds for decreased fall risk. Target date: 03/08/15.   Time 4   Period Weeks   Status New   Additional Short Term Goals   Additional Short Term Goals Yes   PT SHORT TERM GOAL #6   Title Pt will ambulate x200' over level surfaces with [facility] and supervision with no overt LOB. Target date: 03/08/15.   Time 4   Period Weeks   Status New           PT Long Term Goals - 02/07/15 2029    PT LONG TERM GOAL #1   Title Pt will verbalize understanding of fall prevention within home environment. Target 02/10/15   Time 8   Period Weeks   Status Achieved   PT LONG TERM GOAL #2   Title Pt will improve TUG to less than or equal to 25 seconds for decreased fall risk.   Time 8   Status Not Met  Unmet goal will continue as short-term goal   PT LONG TERM GOAL #3   Title Pt will improve gait velocity to at least 1 ft/sec for improved gait efficiency and safety.   Time 8   Period Weeks   Status Achieved   PT LONG TERM GOAL #4   Title Pt will ambulate at least 50-100 ft using hemiwalker, household distances, with supervision.   Time 8   Period Weeks   Status Achieved   PT LONG TERM GOAL #5   Title Pt will ambulate 150' over outdoor, unlevel surfaces with LRAD and supervision to indicate increased stability/independence with community mobility. Target date: 04/05/15.   Time 8   Period Weeks   Status New   Additional Long Term Goals   Additional Long Term Goals Yes   PT LONG TERM GOAL #6   Title Pt will negotiate 4 stairs with single rail and supervision to increase pt independence with community mobility. Target date: 04/05/15.   Time  8   Period Weeks   Status New   PT LONG TERM GOAL #7   Title Pt will increase gait speed from 1.02 ft/sec to 1.62 ft/sec to indicate significant improvement in efficiency of ambulation. Target date: 04/05/15.  Time 8   Period Weeks   Status New               Plan - 02/07/15 2022    Clinical Impression Statement Due to excellent pt compliance and family support, pt has made marked gains in stability and independence with functional mobility, as exhibited by pt having met 3 of 4 LTG's. Pt will continue to benefit from outpatient PT 2x/week for 8 additional weeks to continue to progress household and community mobility and to decrease fall risk.   Pt will benefit from skilled therapeutic intervention in order to improve on the following deficits Abnormal gait;Decreased activity tolerance;Decreased balance;Decreased mobility;Decreased range of motion;Decreased safety awareness;Difficulty walking;Decreased strength;Impaired tone;Decreased knowledge of use of DME;Decreased endurance;Decreased coordination;Impaired flexibility;Impaired perceived functional ability;Postural dysfunction;Impaired sensation;Impaired UE functional use;Impaired vision/preception;Decreased cognition;Pain   Rehab Potential Good   PT Frequency 2x / week   PT Duration 8 weeks   PT Treatment/Interventions ADLs/Self Care Home Management;Therapeutic activities;Functional mobility training;Stair training;Gait training;Therapeutic exercise;Balance training;Neuromuscular re-education;Patient/family education;DME Instruction;Cognitive remediation;Orthotic Fit/Training;Manual techniques;Passive range of motion;Visual/perceptual remediation/compensation   PT Next Visit Plan Follow up on request for MD order for [facility]; continue gait training with focus on lateral weight shift to L side, L single limb stance stability; postural control/awareness   Consulted and Agree with Plan of Care Patient;Family member/caregiver   Family Member  Consulted wife        Problem List Patient Active Problem List   Diagnosis Date Noted  . Spastic hemiparesis affecting nondominant side 11/08/2014  . Hypothyroidism 10/27/2014  . Left-sided neglect 09/23/2014  . Acute ischemic right MCA stroke 09/16/2014  . Left hemiparesis   . CVA (cerebral vascular accident)   . Right carotid artery occlusion   . Acute right MCA stroke   . Essential hypertension 09/12/2014  . CVA (cerebral infarction) 09/12/2014  . Type 2 diabetes mellitus with peripheral neuropathy 09/12/2014    Billie Ruddy, PT, DPT [facility] [address] [place], [place], [address] Phone: [phone]   Fax:  [phone] 02/07/2015, 9:09 PM

## 2015-02-08 NOTE — Addendum Note (Signed)
Addended by: Charlett Blake on: 02/08/2015 04:17 PM   Modules accepted: Orders

## 2015-02-08 NOTE — Telephone Encounter (Signed)
Dr. Letta Pate,  Heather Streeper has been utilizing a narrow base quad cane during outpatient PT/OT sessions. He would benefit from having a personal NBQC, as he is currently having difficulty traversing his home bathroom using hemi walker.  If you agree, please submit an order for a narrow base quad cane.  Thanks so much,  Billie Ruddy, PT, DPT [facility] [address] [place], [place], [address] Phone: [phone]   Fax:  [phone] 02/08/2015, 8:40 AM

## 2015-02-09 ENCOUNTER — Encounter: Payer: Self-pay | Admitting: Occupational Therapy

## 2015-02-09 ENCOUNTER — Ambulatory Visit: Payer: 59 | Admitting: Occupational Therapy

## 2015-02-09 DIAGNOSIS — R414 Neurologic neglect syndrome: Secondary | ICD-10-CM

## 2015-02-09 DIAGNOSIS — R4189 Other symptoms and signs involving cognitive functions and awareness: Secondary | ICD-10-CM

## 2015-02-09 DIAGNOSIS — Z7409 Other reduced mobility: Secondary | ICD-10-CM

## 2015-02-09 DIAGNOSIS — G8194 Hemiplegia, unspecified affecting left nondominant side: Secondary | ICD-10-CM | POA: Diagnosis not present

## 2015-02-09 DIAGNOSIS — R201 Hypoesthesia of skin: Secondary | ICD-10-CM

## 2015-02-09 NOTE — Therapy (Signed)
[facility] [address] [place], [place], [address] Phone: [phone]   Fax:  [phone]  Occupational Therapy Treatment  Patient Details  Name: Brian Hall MRN: [id number] Date of Birth: [birth year] Referring Provider:  Claretta Fraise, MD  Encounter Date: 02/09/2015      OT End of Session - 02/09/15 1526    Visit Number 15   Number of Visits 16   Date for OT Re-Evaluation 02/15/15   Authorization Type [employer]   OT Start Time 1315   OT Stop Time 1400   OT Time Calculation (min) 45 min   Activity Tolerance Patient tolerated treatment well      Past Medical History  Diagnosis Date  . Hypertension   . Diabetes mellitus   . Obesity   . CHF (congestive heart failure)   . SOB (shortness of breath)   . Hyperkalemia   . Renal insufficiency   . Cardiogenic shock   . Cardiac LV ejection fraction 10-20%   . Hyperlipemia   . Hypothyroidism   . Throat cancer 2011    s/p neck dissection, radiation, chemo  . Stroke   . Blurred vision   . Joint pain   . Joint swelling     Past Surgical History  Procedure Laterality Date  . Cardiac catheterization    . Neck dissection Right 2011    s/p resection of throat cancer with multiple nodes removed    There were no vitals filed for this visit.  Visit Diagnosis:  Hemiplegia affecting left nondominant side  Impaired functional mobility and activity tolerance  Impaired sensation  Left-sided neglect  Impaired cognition      Subjective Assessment - 02/09/15 1321    Subjective  My wrist feels so much better I have no pain there.   Patient is accompained by: Family member  wife   Pertinent History see epic snapshot   Patient Stated Goals Get back to doing some of the things I used to do.   Currently in Pain? Yes   Pain Score 5    Pain Location Ankle   Pain Orientation Left   Pain Descriptors / Indicators Burning;Spasm  pt reports spontaneous spasms   Pain Type Chronic pain   Pain Onset 1 to 4 weeks ago   Pain Frequency Intermittent   Aggravating Factors  comes and goes, riding in jeep   Pain Relieving Factors repositioning, taking brace off if it is on and putting brace on if it is off.  Putting weight on it.   Multiple Pain Sites No                      OT Treatments/Exercises (OP) - 02/09/15 0001    ADLs   Toileting Pt able to hike pants up and down with close supervision and min vc's for balance and safety.    Functional Mobility Pt required close supervision to occassional CG for functional ambulation with cues for alignment, weight shift and activation of L side during functional mobility. Pt only required supervision and min vc's for toilet transfer with [facility]. Pt and wife have been practicing toilet transfers at home.     Neurological Re-education Exercises   Other Exercises 1 Neuro re ed to address sit to stand, stand to sit, transfers with emphasis on alignment, forward weight shift for sit to stand, and weight shift during transfer. Pt with significant improvement in alignment, weight shift and safety.  Neuro re ed to also address  scapular stability with resistive activities and low to mid reach in weight bearing with LUE.                  OT Short Term Goals - 02/09/15 1415    OT SHORT TERM GOAL #1   Title Pt and wife will be mod I with HEP - 01/18/2015.  Goal dates adjusted as pt will not return to clinic prior to 12/21/14   Status Achieved   OT SHORT TERM GOAL #2   Title Pt will require min a for LB dressing   Status Achieved   OT SHORT TERM GOAL #3   Title Pt will demonstrate ability to use LUE as gross assist during basic self care 50% of the time.   Status Partially Met   OT SHORT TERM GOAL #4   Title Pt will need no more than min vc's to compensate for L neglect during functional mobility tasks   Status Achieved   OT SHORT TERM GOAL #5   Title Pt will demonstrate ability to attend to familiar  functional task for 15 minute with no more than one vc.   Status Achieved   OT SHORT TERM GOAL #6   Title Pt will require no more than min assist for toilet transfers   Status Achieved           OT Long Term Goals - 02/09/15 [place] #1   Title Pt and wife will be mod I with upgraded HEP - 02/15/2015. Goal date adjusted as pt will not return to clinic prior to 12/21/2014   Status Achieved   OT LONG TERM GOAL #2   Title Pt will demonstrate ability to use LUE as gross assist 100% of the time in basic self care tasks   Status Not Met   OT LONG TERM GOAL #3   Title Pt will demonstrate ability to hold cylindrical object in L hand.    Status Achieved   OT LONG TERM GOAL #4   Title Pt will verbalize understanding of AE for buttoning, zipping and cutting   Status Achieved   OT LONG TERM GOAL #5   Title Pt will be supervision for toilet transfers   Status Achieved   OT LONG TERM GOAL #6   Title Pt will be min a for shower transfers   Status Achieved   OT LONG TERM GOAL #7   Title Pt will  be supervision for simple hot meal prep   Status Deferred               Plan - 02/09/15 1415    Clinical Impression Statement Pt with excellent gains in functional mobility, safety, carry over for transfers, dynamic standing balance.  Pt also making progress good progress in ADL's and return of LUE. Discussed renewing patient given excelllent progress - pt and wife in agreement.    Pt will benefit from skilled therapeutic intervention in order to improve on the following deficits (Retired) Decreased balance;Decreased coordination;Decreased cognition;Decreased knowledge of use of DME;Decreased mobility;Decreased range of motion;Decreased safety awareness;Difficulty walking;Decreased strength;Increased edema;Increased muscle spasms;Impaired UE functional use;Impaired tone;Impaired sensation;Impaired vision/preception;Pain   Rehab Potential Good   Clinical Impairments Affecting  Rehab Potential Pt will benefit from skilled OT to address deficits outlined in eval to maximize independence.   OT Frequency 2x / week   OT Duration 8 weeks   OT Treatment/Interventions Self-care/ADL training;Cryotherapy;Moist Heat;Electrical Stimulation;Fluidtherapy;DME and/or AE instruction;Energy conservation;Neuromuscular education;Therapeutic exercise;Functional Mobility Training;Manual Therapy;Passive  range of motion;Splinting;Therapeutic activities;Balance training;Patient/family education;Cognitive remediation/compensation   Plan establish new goals with pt      Will complete renewal next visit  Problem List Patient Active Problem List   Diagnosis Date Noted  . Spastic hemiparesis affecting nondominant side 11/08/2014  . Hypothyroidism 10/27/2014  . Left-sided neglect 09/23/2014  . Acute ischemic right MCA stroke 09/16/2014  . Left hemiparesis   . CVA (cerebral vascular accident)   . Right carotid artery occlusion   . Acute right MCA stroke   . Essential hypertension 09/12/2014  . CVA (cerebral infarction) 09/12/2014  . Type 2 diabetes mellitus with peripheral neuropathy 09/12/2014    Quay Burow 02/09/2015, 3:27 PM  [facility] [address] [place], [place], [address] Phone: [phone]   Fax:  [phone]

## 2015-02-10 ENCOUNTER — Other Ambulatory Visit: Payer: Self-pay | Admitting: *Deleted

## 2015-02-10 MED ORDER — SITAGLIPTIN PHOSPHATE 100 MG PO TABS: 100.0000 mg | ORAL_TABLET | Freq: Every day | ORAL | Status: DC

## 2015-02-14 ENCOUNTER — Ambulatory Visit: Payer: 59 | Admitting: Physical Therapy

## 2015-02-14 ENCOUNTER — Ambulatory Visit (HOSPITAL_BASED_OUTPATIENT_CLINIC_OR_DEPARTMENT_OTHER): Payer: 59 | Admitting: Physical Medicine & Rehabilitation

## 2015-02-14 ENCOUNTER — Encounter: Payer: Self-pay | Admitting: Physical Medicine & Rehabilitation

## 2015-02-14 ENCOUNTER — Encounter: Payer: 59 | Attending: Physical Medicine & Rehabilitation

## 2015-02-14 ENCOUNTER — Ambulatory Visit: Payer: 59 | Admitting: Occupational Therapy

## 2015-02-14 ENCOUNTER — Encounter: Payer: Self-pay | Admitting: Occupational Therapy

## 2015-02-14 VITALS — BP 110/67 | HR 50 | Resp 14

## 2015-02-14 DIAGNOSIS — G8194 Hemiplegia, unspecified affecting left nondominant side: Secondary | ICD-10-CM

## 2015-02-14 DIAGNOSIS — Z7409 Other reduced mobility: Secondary | ICD-10-CM

## 2015-02-14 DIAGNOSIS — R414 Neurologic neglect syndrome: Secondary | ICD-10-CM

## 2015-02-14 DIAGNOSIS — I63511 Cerebral infarction due to unspecified occlusion or stenosis of right middle cerebral artery: Secondary | ICD-10-CM | POA: Insufficient documentation

## 2015-02-14 DIAGNOSIS — G8110 Spastic hemiplegia affecting unspecified side: Secondary | ICD-10-CM | POA: Diagnosis not present

## 2015-02-14 DIAGNOSIS — R4189 Other symptoms and signs involving cognitive functions and awareness: Secondary | ICD-10-CM

## 2015-02-14 DIAGNOSIS — R201 Hypoesthesia of skin: Secondary | ICD-10-CM

## 2015-02-14 DIAGNOSIS — R269 Unspecified abnormalities of gait and mobility: Secondary | ICD-10-CM

## 2015-02-14 NOTE — Progress Notes (Signed)
59 year old male with right MCA infarct causing left and he plegia and spasticity. He is going through outpatient PT and OT. He is ambulating with a quad cane about 115 feet  Needs assistance with  Showering only for his back. Supervision and assistance with toileting. Working with left upper extremity as well. Trying to use grasp. Had Botox proximally 3 months ago, clenched fist improved did have some weakness initially in finger flexion. Some concerns about Botox causing excessive finger weakness if it is repeated  Exam Gen. No acute distress Mood and affect appropriate Left upper extremity Clonus at the wrist flexors when attempting to extend elbow overhead 3/5 in the left deltoid 3 minus at the tricep, 3 months at the biceps 3 minus at the finger flexors 0 at the finger extensors and wrist extensors 4 minus at the hip flexor  as well as knee extensor. Sensation reduced left upper extremity light touch Ashworth grade 2 at the finger flexors with clonus  1. Left spastic hemiparesis secondary to right MCA infarct. We discussed tone reduction. 2 options either waiting to see whether tone increases to the point where fist is clenched. Other option would be to do a reduced dose of Botox today.100 units rather than 200 units

## 2015-02-14 NOTE — Therapy (Signed)
[facility] [address] [place], [place], [address] Phone: [phone]   Fax:  [phone]  Physical Therapy Treatment  Patient Details  Name: Brian Hall MRN: [id number] Date of Birth: May 22, 1956 Referring Provider:  Claretta Fraise, MD  Encounter Date: 02/14/2015      PT End of Session - 02/14/15 1655    Visit Number 15   Number of Visits 33   Date for PT Re-Evaluation 04/08/15   PT Start Time 1446   PT Stop Time 1530   PT Time Calculation (min) 44 min   Equipment Utilized During Treatment Gait belt   Activity Tolerance Patient tolerated treatment well   Behavior During Therapy [facility] for tasks assessed/performed      Past Medical History  Diagnosis Date  . Hypertension   . Diabetes mellitus   . Obesity   . CHF (congestive heart failure)   . SOB (shortness of breath)   . Hyperkalemia   . Renal insufficiency   . Cardiogenic shock   . Cardiac LV ejection fraction 10-20%   . Hyperlipemia   . Hypothyroidism   . Throat cancer 2011    s/p neck dissection, radiation, chemo  . Stroke   . Blurred vision   . Joint pain   . Joint swelling     Past Surgical History  Procedure Laterality Date  . Cardiac catheterization    . Neck dissection Right 2011    s/p resection of throat cancer with multiple nodes removed    There were no vitals filed for this visit.  Visit Diagnosis:  Hemiplegia affecting left nondominant side  Abnormality of gait  Impaired functional mobility and activity tolerance      Subjective Assessment - 02/14/15 1453    Subjective No new complaints. No falls to report.   Patient is accompained by: Family member   Pertinent History CVA with L hemiparesis 09/2014   Currently in Pain? Yes   Pain Score 5    Pain Location Wrist   Pain Orientation Left   Pain Descriptors / Indicators Burning   Pain Type Chronic pain   Pain Onset More than a month ago   Pain Frequency Intermittent         Treatment   Therapeutic Activities: Pt performed functional ambulation 2 x115' (seated rest break between trials) over level, indoor surface; initial trial with SBQC and min guard-min A, multimodal cueing for LLE stance stability, upright posture, lateral weight shift to L side, L hip protraction. During LLE advancement, noted limited L hip/knee flexion, which appears to be secondary to LLE extension synergy.  Neuro Re-ed: - Pt performed the following in tall kneeling to increase proximal stability, proprioceptive input, LE co-contraction, and to promote selective control of L knee flexion with concurrent hip extension: static tall kneeling without UE support; forward/retro walking x4 steps with BUE support; squats x10 reps then x6 reps (to pt fatigue) with multimodal cueing for full B hip extension. Tall kneeling activities ended secondary to onset of discomfort in R hip, which was immediately relieved upon change in position.   - Supine LLE D2 flexion/extension x15 reps rhythmic initiation, x10 reps resisted, x6 reps slow reversal hold with multimodal cueing to emphasize active L hip/knee flexion. Focus of PNF: addressing extension synergy in LLE to promote more normalized L swing phase of gait. Noted pt ability to activate L ankle dorsiflexors; however, dorsiflexion limited by tone in L ankle plantarflexors.        PT Education -  02/14/15 1728    Education provided Yes   Education Details Purpose of tall kneeling in reference to functional mobility.   Person(s) Educated Patient;Spouse   Methods Explanation   Comprehension Verbalized understanding          PT Short Term Goals - 02/07/15 2028    PT SHORT TERM GOAL #1   Title Pt will perform HEP with supervision of family, for improved strength and balance. (Target 01/10/15)   Time 4   Period Weeks   Status Achieved   PT SHORT TERM GOAL #2   Title Pt will improve Timed UP and Go score to less than or equal to 35 seconds for  decreased fall risk.   Baseline eval-46.78 sec   Time 4   Period Weeks   Status Not Met  46.81 with quad cane and close supervision and occasional CGA   PT SHORT TERM GOAL #3   Title Berg Balance score to be assessed, with pt scoring at least 5 additional points to decrease fall risk.   Time 4   Period Weeks   Status Achieved  Scored 33 today   PT SHORT TERM GOAL #4   Title Pt will perform at least 6 of 10 reps of sit<>stand transfers no UE support, with equal weightbearing through bilateral lower extremities, independently.   Time 4   Period Weeks   Status Achieved   PT SHORT TERM GOAL #5   Title Pt will improve TUG time from 29.69 sec to 25 seconds for decreased fall risk. Target date: 03/08/15.   Time 4   Period Weeks   Status New   Additional Short Term Goals   Additional Short Term Goals Yes   PT SHORT TERM GOAL #6   Title Pt will ambulate x200' over level surfaces with [facility] and supervision with no overt LOB. Target date: 03/08/15.   Time 4   Period Weeks   Status New           PT Long Term Goals - 02/07/15 2029    PT LONG TERM GOAL #1   Title Pt will verbalize understanding of fall prevention within home environment. Target 02/10/15   Time 8   Period Weeks   Status Achieved   PT LONG TERM GOAL #2   Title Pt will improve TUG to less than or equal to 25 seconds for decreased fall risk.   Time 8   Status Not Met  Unmet goal will continue as short-term goal   PT LONG TERM GOAL #3   Title Pt will improve gait velocity to at least 1 ft/sec for improved gait efficiency and safety.   Time 8   Period Weeks   Status Achieved   PT LONG TERM GOAL #4   Title Pt will ambulate at least 50-100 ft using hemiwalker, household distances, with supervision.   Time 8   Period Weeks   Status Achieved   PT LONG TERM GOAL #5   Title Pt will ambulate 150' over outdoor, unlevel surfaces with LRAD and supervision to indicate increased stability/independence with community mobility.  Target date: 04/05/15.   Time 8   Period Weeks   Status New   Additional Long Term Goals   Additional Long Term Goals Yes   PT LONG TERM GOAL #6   Title Pt will negotiate 4 stairs with single rail and supervision to increase pt independence with community mobility. Target date: 04/05/15.   Time 8   Period Weeks   Status New  PT LONG TERM GOAL #7   Title Pt will increase gait speed from 1.02 ft/sec to 1.62 ft/sec to indicate significant improvement in efficiency of ambulation. Target date: 04/05/15.   Time 8   Period Weeks   Status New               Plan - 02/14/15 1659    Clinical Impression Statement Pt demonstrating more awareness of posture, body position, and functional use of LUE/LLE during mobility. Gait stability limited by decreased selective control in LLE, tone in L plantarflexors, and limited lateral weight shift to L side. Pt will continue to benefit from skilled PT to address said impairments, decrease fall risk.   PT Next Visit Plan Address tone in L plantarflexors, extensor synergy in LLE to improve gait stability.   Consulted and Agree with Plan of Care Patient;Family member/caregiver   Family Member Consulted wife        Problem List Patient Active Problem List   Diagnosis Date Noted  . Spastic hemiparesis affecting nondominant side 11/08/2014  . Hypothyroidism 10/27/2014  . Left-sided neglect 09/23/2014  . Acute ischemic right MCA stroke 09/16/2014  . Left hemiparesis   . CVA (cerebral vascular accident)   . Right carotid artery occlusion   . Acute right MCA stroke   . Essential hypertension 09/12/2014  . CVA (cerebral infarction) 09/12/2014  . Type 2 diabetes mellitus with peripheral neuropathy 09/12/2014   Billie Ruddy, PT, DPT [facility] [address] [place], [place], [address] Phone: [phone]   Fax:  [phone] 02/14/2015, 5:31 PM

## 2015-02-14 NOTE — Patient Instructions (Signed)
Please call us if you notice the hand clenching up and difficulty with prying the fingers open

## 2015-02-14 NOTE — Patient Instructions (Signed)
Add to home exercise program for left arm:  1. When doing the exercises you do in laying down with the PVC square, Brian Hall will help to keep the elbow straight and to keep the arm from turning in.  Make sure you only give as much effort as is needed to get the job done - no more than that!!  Think easy movement and long stretch and make sure you breath!  2.  Add this activity:  In standing hold PVC square with both hands.  With elbows straight slowly raise the square up as far as you can WITHOUT hiking your shoulder!!  Brian Hall will stand on your left and help you with your hand if you need it and cue you if you hike your shoulder. Do this in front of a mirror if you can so you can tell when you are hiking your shoulder. Remember only as much energy as is needed - don't overdo!!  Make sure you have no pain in your shoulder when you do this. Do 10, rest then do 10 more.

## 2015-02-14 NOTE — Therapy (Signed)
[facility] [address] [place], [place], [address] Phone: [phone]   Fax:  [phone]  Occupational Therapy Treatment  Patient Details  Name: Brian Hall MRN: [id number] Date of Birth: 1956/07/17 Referring Provider:  Claretta Fraise, MD  Encounter Date: 02/14/2015      OT End of Session - 02/14/15 1655    Visit Number 16   Number of Visits 16   Date for OT Re-Evaluation 02/15/15   Authorization Type [employer]   OT Start Time 1400   OT Stop Time 1444   OT Time Calculation (min) 44 min   Activity Tolerance Patient tolerated treatment well      Past Medical History  Diagnosis Date  . Hypertension   . Diabetes mellitus   . Obesity   . CHF (congestive heart failure)   . SOB (shortness of breath)   . Hyperkalemia   . Renal insufficiency   . Cardiogenic shock   . Cardiac LV ejection fraction 10-20%   . Hyperlipemia   . Hypothyroidism   . Throat cancer 2011    s/p neck dissection, radiation, chemo  . Stroke   . Blurred vision   . Joint pain   . Joint swelling     Past Surgical History  Procedure Laterality Date  . Cardiac catheterization    . Neck dissection Right 2011    s/p resection of throat cancer with multiple nodes removed    There were no vitals filed for this visit.  Visit Diagnosis:  Hemiplegia affecting left nondominant side - Plan: Ot plan of care cert/re-cert  Impaired functional mobility and activity tolerance - Plan: Ot plan of care cert/re-cert  Impaired sensation - Plan: Ot plan of care cert/re-cert  Left-sided neglect - Plan: Ot plan of care cert/re-cert  Impaired cognition - Plan: Ot plan of care cert/re-cert      Subjective Assessment - 02/14/15 1410    Subjective  Am I doing better?  I think I am but I am not sure   Patient is accompained by: Family member  wife   Pertinent History see epic snapshot   Patient Stated Goals Get back to doing some of the things I  used to do.   Currently in Pain? Yes   Pain Score 5    Pain Location Ankle   Pain Orientation Left   Pain Descriptors / Indicators Burning   Pain Type Chronic pain   Pain Onset More than a month ago   Pain Frequency Intermittent   Aggravating Factors  comes and goes, sitting and riding in jeep   Pain Relieving Factors repositioning, taking off brace or putting it on                      OT Treatments/Exercises (OP) - 02/14/15 0001    Neurological Re-education Exercises   Other Exercises 1 Neuro re ed to address isolated movement in LUE with emphasis on decreasing synergy influence (IR, elbow flexion, fisted hand) first in supine and then in sitting. Modified HEP for wife to a facilitate more normal movement pattern and wife able to return demonstrate. Also addressed bilateral low reach to approximately 40* shoulder flexion in sitting and standing.  Pt demonstrates better control of LUE in standing with cues for alignment and incoporating LLE.  See pt instructions for activity added to HEP.                  OT Education - 02/14/15 7654  Education provided Yes   Education Details Updated HEP for LUE   Person(s) Educated Patient;Spouse   Methods Explanation;Demonstration;Tactile cues;Verbal cues;Handout   Comprehension Verbalized understanding;Returned demonstration          OT Short Term Goals - 02/14/15 1644    OT SHORT TERM GOAL #1   Title Pt and wife will be mod I with HEP - 01/18/2015.  Goal dates adjusted as pt will not return to clinic prior to 12/21/14   Status Achieved   OT SHORT TERM GOAL #2   Title Pt will require min a for LB dressing   Status Achieved   OT SHORT TERM GOAL #3   Title Pt will demonstrate ability to use LUE as gross assist during basic self care 50% of the time.   Status Partially Met   OT SHORT TERM GOAL #4   Title Pt will need no more than min vc's to compensate for L neglect during functional mobility tasks   Status Achieved    OT SHORT TERM GOAL #5   Title Pt will demonstrate ability to attend to familiar functional task for 15 minute with no more than one vc.   Status Achieved   Additional Short Term Goals   Additional Short Term Goals Yes   OT SHORT TERM GOAL #6   Title Pt will require no more than min assist for toilet transfers   Status Achieved   OT SHORT TERM GOAL #7   Title Pt and wife will be mod I with upgraded HEP prn - 03/14/2015   Status New   OT SHORT TERM GOAL #8   Title Pt will be able to use LUE as gross assist 75% of the time for basic ADL tasks with min cues   Status New   OT SHORT TERM GOAL  #9   TITLE Pt will require supervision for tub bench transfers    Status New   OT SHORT TERM GOAL  #10   TITLE Pt will require min a for simple snack prep at ambulatory level   Status New           OT Long Term Goals - 02/14/15 1650    OT LONG TERM GOAL #1   Title Pt and wife will be mod I with upgraded HEP - 02/15/2015. Goal date adjusted as pt will not return to clinic prior to 12/21/2014   Status Achieved   OT LONG TERM GOAL #2   Title Pt will demonstrate ability to use LUE as gross assist 100% of the time in basic self care tasks   Status Not Met   OT LONG TERM GOAL #3   Title Pt will demonstrate ability to hold cylindrical object in L hand.    Status Achieved   OT LONG TERM GOAL #4   Title Pt will verbalize understanding of AE for buttoning, zipping and cutting   Status Achieved   OT LONG TERM GOAL #5   Title Pt will be supervision for toilet transfers   Status Achieved   Long Term Additional Goals   Additional Long Term Goals Yes   OT LONG TERM GOAL #6   Title Pt will be min a for shower transfers   Status Achieved   OT LONG TERM GOAL #7   Title Pt will  be supervision for simple hot meal prep   Status Deferred   OT LONG TERM GOAL #8   Title Pt will demonstrate the ability to use LUE as gross a for basic  ADL's 100% of the time - 04/11/2015   Status New   OT LONG TERM GOAL  #9    Baseline Pt will be mod I for toilet transfers   Status New   OT LONG TERM GOAL  #10   TITLE Pt will be supervision for simple hot meal prep   Status New   OT LONG TERM GOAL  #11   TITLE Pt will demonstrate ability for blilateral mid reach for functional tasks   Status New               Plan - 02/14/15 1653    Clinical Impression Statement Pt has made excellent gains and has met 6/6 STG's and 5/6 LTG's. Pt and wife are extremely motivated and pt has great potential to continue to make progress with functional mobility and with LUE functional use.  WIll renew plan of care with new goals today - pt and wife in agreement.   Pt will benefit from skilled therapeutic intervention in order to improve on the following deficits (Retired) Decreased balance;Decreased coordination;Decreased cognition;Decreased knowledge of use of DME;Decreased mobility;Decreased range of motion;Decreased safety awareness;Difficulty walking;Decreased strength;Increased edema;Increased muscle spasms;Impaired UE functional use;Impaired tone;Impaired sensation;Impaired vision/preception;Pain   Rehab Potential Good   Clinical Impairments Affecting Rehab Potential Pt will benefit from skilled OT to address deficits outlined in eval to maximize independence.   OT Frequency 2x / week   OT Duration 8 weeks   OT Treatment/Interventions Self-care/ADL training;Cryotherapy;Moist Heat;Electrical Stimulation;Fluidtherapy;DME and/or AE instruction;Energy conservation;Neuromuscular education;Therapeutic exercise;Functional Mobility Training;Manual Therapy;Passive range of motion;Splinting;Therapeutic activities;Balance training;Patient/family education;Cognitive remediation/compensation   Plan Review changes in HEP, neuro re ed to LUE for low to mid reach, functional moblity   OT Home Exercise Plan issued inital HEP-12/27/14, cane shoulder flexion 01/03/15   Consulted and Agree with Plan of Care Patient   Family Member Consulted  wife        Problem List Patient Active Problem List   Diagnosis Date Noted  . Spastic hemiparesis affecting nondominant side 11/08/2014  . Hypothyroidism 10/27/2014  . Left-sided neglect 09/23/2014  . Acute ischemic right MCA stroke 09/16/2014  . Left hemiparesis   . CVA (cerebral vascular accident)   . Right carotid artery occlusion   . Acute right MCA stroke   . Essential hypertension 09/12/2014  . CVA (cerebral infarction) 09/12/2014  . Type 2 diabetes mellitus with peripheral neuropathy 09/12/2014    Quay Burow, OTR/L 02/14/2015, 4:58 PM  [facility] [address] [place], [place], [address] Phone: [phone]   Fax:  [phone]

## 2015-02-16 ENCOUNTER — Ambulatory Visit: Payer: 59 | Admitting: Physical Therapy

## 2015-02-16 ENCOUNTER — Ambulatory Visit: Payer: 59 | Admitting: Occupational Therapy

## 2015-02-16 DIAGNOSIS — Z7409 Other reduced mobility: Secondary | ICD-10-CM

## 2015-02-16 DIAGNOSIS — R414 Neurologic neglect syndrome: Secondary | ICD-10-CM

## 2015-02-16 DIAGNOSIS — R269 Unspecified abnormalities of gait and mobility: Secondary | ICD-10-CM

## 2015-02-16 DIAGNOSIS — R201 Hypoesthesia of skin: Secondary | ICD-10-CM

## 2015-02-16 DIAGNOSIS — G8194 Hemiplegia, unspecified affecting left nondominant side: Secondary | ICD-10-CM

## 2015-02-16 DIAGNOSIS — R4189 Other symptoms and signs involving cognitive functions and awareness: Secondary | ICD-10-CM

## 2015-02-16 NOTE — Therapy (Signed)
[facility] [address] [place], [place], [address] Phone: [phone]   Fax:  [phone]  Occupational Therapy Treatment  Patient Details  Name: Brian Hall MRN: [id number] Date of Birth: 01/31/1956 Referring Provider:  Stacks, Warren, MD  Encounter Date: 02/16/2015      OT End of Session - 02/16/15 1739    Visit Number 17   Number of Visits 32   Date for OT Re-Evaluation 04/11/15   Authorization Type [employer]   OT Start Time 1446   OT Stop Time 1530   OT Time Calculation (min) 44 min   Activity Tolerance Patient tolerated treatment well      Past Medical History  Diagnosis Date  . Hypertension   . Diabetes mellitus   . Obesity   . CHF (congestive heart failure)   . SOB (shortness of breath)   . Hyperkalemia   . Renal insufficiency   . Cardiogenic shock   . Cardiac LV ejection fraction 10-20%   . Hyperlipemia   . Hypothyroidism   . Throat cancer 2011    s/p neck dissection, radiation, chemo  . Stroke   . Blurred vision   . Joint pain   . Joint swelling     Past Surgical History  Procedure Laterality Date  . Cardiac catheterization    . Neck dissection Right 2011    s/p resection of throat cancer with multiple nodes removed    There were no vitals filed for this visit.  Visit Diagnosis:  Hemiplegia affecting left nondominant side  Impaired functional mobility and activity tolerance  Impaired sensation  Left-sided neglect  Impaired cognition                    OT Treatments/Exercises (OP) - 02/16/15 0001    Neurological Re-education Exercises   Other Exercises 1 Neuro re ed to address functional mobility in standing with emphasis on sit to stand, finding midline, postural alignment, weight shifting, dynamic standing balance, safety and cogniitve challenges during functional ambulation tasks. Pt continues to make excellent progress                  OT  Short Term Goals - 02/16/15 1736    OT SHORT TERM GOAL #1   Title Pt and wife will be mod I with HEP - 01/18/2015.  Goal dates adjusted as pt will not return to clinic prior to 12/21/14   Status Achieved   OT SHORT TERM GOAL #2   Title Pt will require min a for LB dressing   Status Achieved   OT SHORT TERM GOAL #3   Title Pt will demonstrate ability to use LUE as gross assist during basic self care 50% of the time.   Status Partially Met   OT SHORT TERM GOAL #4   Title Pt will need no more than min vc's to compensate for L neglect during functional mobility tasks   Status Achieved   OT SHORT TERM GOAL #5   Title Pt will demonstrate ability to attend to familiar functional task for 15 minute with no more than one vc.   Status Achieved   OT SHORT TERM GOAL #6   Title Pt will require no more than min assist for toilet transfers   Status Achieved   OT SHORT TERM GOAL #7   Title Pt and wife will be mod I with upgraded HEP prn - 03/14/2015   Status On-going   OT SHORT TERM GOAL #8     Title Pt will be able to use LUE as gross assist 75% of the time for basic ADL tasks with min cues   Status On-going   OT SHORT TERM GOAL  #9   TITLE Pt will require supervision for tub bench transfers    Status New   OT SHORT TERM GOAL  #10   TITLE Pt will require min a for simple snack prep at ambulatory level   Status On-going           OT Long Term Goals - 02/16/15 1737    OT LONG TERM GOAL #1   Title Pt and wife will be mod I with upgraded HEP - 02/15/2015. Goal date adjusted as pt will not return to clinic prior to 12/21/2014   Status Achieved   OT LONG TERM GOAL #2   Title Pt will demonstrate ability to use LUE as gross assist 100% of the time in basic self care tasks   Status Not Met   OT LONG TERM GOAL #3   Title Pt will demonstrate ability to hold cylindrical object in L hand.    Status Achieved   OT LONG TERM GOAL #4   Title Pt will verbalize understanding of AE for buttoning, zipping and  cutting   Status Achieved   OT LONG TERM GOAL #5   Title Pt will be supervision for toilet transfers   Status Achieved   OT LONG TERM GOAL #6   Title Pt will be min a for shower transfers   Status Achieved   OT LONG TERM GOAL #7   Title Pt will  be supervision for simple hot meal prep   Status Deferred   OT LONG TERM GOAL #8   Title Pt will demonstrate the ability to use LUE as gross a for basic ADL's 100% of the time - 04/11/2015   Status On-going   OT LONG TERM GOAL  #9   Baseline Pt will be mod I for toilet transfers   Status On-going   OT LONG TERM GOAL  #10   TITLE Pt will be supervision for simple hot meal prep   Status On-going   OT LONG TERM GOAL  #11   TITLE Pt will demonstrate ability for blilateral mid reach for functional tasks   Status On-going               Plan - 02/16/15 1737    Clinical Impression Statement Pt making good progress toward new STG"s.  Pt and wife continue to be very motivated to work toward independent functioning. Pt with improved safety awareness, postural control in standing.   Pt will benefit from skilled therapeutic intervention in order to improve on the following deficits (Retired) Decreased balance;Decreased coordination;Decreased cognition;Decreased knowledge of use of DME;Decreased mobility;Decreased range of motion;Decreased safety awareness;Difficulty walking;Decreased strength;Increased edema;Increased muscle spasms;Impaired UE functional use;Impaired tone;Impaired sensation;Impaired vision/preception;Pain   Rehab Potential Good   Clinical Impairments Affecting Rehab Potential Pt will benefit from skilled OT to address deficits outlined in eval to maximize independence.   OT Frequency 2x / week   OT Duration 8 weeks   OT Treatment/Interventions Self-care/ADL training;Cryotherapy;Moist Heat;Electrical Stimulation;Fluidtherapy;DME and/or AE instruction;Energy conservation;Neuromuscular education;Therapeutic exercise;Functional Mobility  Training;Manual Therapy;Passive range of motion;Splinting;Therapeutic activities;Balance training;Patient/family education;Cognitive remediation/compensation   Plan neuro re ed to LUE, balance, functional mobility   Consulted and Agree with Plan of Care Patient;Family member/caregiver   Family Member Consulted wife        Problem List Patient Active Problem List     Diagnosis Date Noted  . Spastic hemiparesis affecting nondominant side 11/08/2014  . Hypothyroidism 10/27/2014  . Left-sided neglect 09/23/2014  . Acute ischemic right MCA stroke 09/16/2014  . Left hemiparesis   . CVA (cerebral vascular accident)   . Right carotid artery occlusion   . Acute right MCA stroke   . Essential hypertension 09/12/2014  . CVA (cerebral infarction) 09/12/2014  . Type 2 diabetes mellitus with peripheral neuropathy 09/12/2014    ,  [place], OTR/L 02/16/2015, 5:40 PM  [facility] [address] [place], [place], [address] Phone: [phone]   Fax:  [phone]    

## 2015-02-19 NOTE — Therapy (Signed)
[facility] [address] [place], [place], [address] Phone: [phone]   Fax:  [phone]  Physical Therapy Treatment  Patient Details  Name: Brian Hall MRN: [id number] Date of Birth: 14-Jun-1956 Referring Provider:  Claretta Fraise, MD  Encounter Date: 02/16/2015      PT End of Session - 02/19/15 1710    Visit Number 16   Number of Visits 33   Date for PT Re-Evaluation 04/08/15   PT Start Time 1402   PT Stop Time 1447   PT Time Calculation (min) 45 min   Equipment Utilized During Treatment Gait belt   Activity Tolerance Patient tolerated treatment well   Behavior During Therapy [facility] for tasks assessed/performed      Past Medical History  Diagnosis Date  . Hypertension   . Diabetes mellitus   . Obesity   . CHF (congestive heart failure)   . SOB (shortness of breath)   . Hyperkalemia   . Renal insufficiency   . Cardiogenic shock   . Cardiac LV ejection fraction 10-20%   . Hyperlipemia   . Hypothyroidism   . Throat cancer 2011    s/p neck dissection, radiation, chemo  . Stroke   . Blurred vision   . Joint pain   . Joint swelling     Past Surgical History  Procedure Laterality Date  . Cardiac catheterization    . Neck dissection Right 2011    s/p resection of throat cancer with multiple nodes removed    There were no vitals filed for this visit.  Visit Diagnosis:  Abnormality of gait      Subjective Assessment - 02/19/15 1707    Subjective Pt and wife report pt continues to use w/c while indoors instead of ambulating indoors.   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  He has a motorcycle and would like to ride it again.    Currently in Pain? No/denies         Pt performed functional ambulation 4 x115' (seated rest break between trials) over level,  indoor surface with SBQC and min guard-min A, multimodal cueing for LLE stance stability, upright posture, lateral weight shift to L side, L hip protraction. During LLE advancement, noted limited L hip/knee flexion, which appears to be secondary to LLE extension synergy. Performed L hip flexor stretch supine with LLE off edge of mat x 60 seconds x 3 reps with assist for L knee flexion to improve stretch. Discussed importance of mobility with pt and wife and recommendation to increase ambulation with assistance of wife in home with [facility] and decrease time spent in w/c and use of w/c.  Discussed continuing to use w/c outdoors until PT can assess safety outdoors.        PT Education - 02/19/15 1708    Education provided Yes   Education Details Increasing ambulation indoors with wifes assistance and SBQC;not ambulating outdoors until able to be assessed by PT   Person(s) Educated Patient;Spouse   Methods Explanation   Comprehension Verbalized understanding          PT Short Term Goals - 02/07/15 2028    PT SHORT TERM GOAL #1   Title Pt will perform HEP with supervision of family, for improved strength and balance. (Target 01/10/15)   Time 4   Period Weeks  Status Achieved   PT SHORT TERM GOAL #2   Title Pt will improve Timed UP and Go score to less than or equal to 35 seconds for decreased fall risk.   Baseline eval-46.78 sec   Time 4   Period Weeks   Status Not Met  46.81 with quad cane and close supervision and occasional CGA   PT SHORT TERM GOAL #3   Title Berg Balance score to be assessed, with pt scoring at least 5 additional points to decrease fall risk.   Time 4   Period Weeks   Status Achieved  Scored 33 today   PT SHORT TERM GOAL #4   Title Pt will perform at least 6 of 10 reps of sit<>stand transfers no UE support, with equal weightbearing through bilateral lower extremities, independently.   Time 4   Period Weeks   Status Achieved   PT SHORT TERM GOAL #5   Title Pt  will improve TUG time from 29.69 sec to 25 seconds for decreased fall risk. Target date: 03/08/15.   Time 4   Period Weeks   Status New   Additional Short Term Goals   Additional Short Term Goals Yes   PT SHORT TERM GOAL #6   Title Pt will ambulate x200' over level surfaces with [facility] and supervision with no overt LOB. Target date: 03/08/15.   Time 4   Period Weeks   Status New           PT Long Term Goals - 02/07/15 2029    PT LONG TERM GOAL #1   Title Pt will verbalize understanding of fall prevention within home environment. Target 02/10/15   Time 8   Period Weeks   Status Achieved   PT LONG TERM GOAL #2   Title Pt will improve TUG to less than or equal to 25 seconds for decreased fall risk.   Time 8   Status Not Met  Unmet goal will continue as short-term goal   PT LONG TERM GOAL #3   Title Pt will improve gait velocity to at least 1 ft/sec for improved gait efficiency and safety.   Time 8   Period Weeks   Status Achieved   PT LONG TERM GOAL #4   Title Pt will ambulate at least 50-100 ft using hemiwalker, household distances, with supervision.   Time 8   Period Weeks   Status Achieved   PT LONG TERM GOAL #5   Title Pt will ambulate 150' over outdoor, unlevel surfaces with LRAD and supervision to indicate increased stability/independence with community mobility. Target date: 04/05/15.   Time 8   Period Weeks   Status New   Additional Long Term Goals   Additional Long Term Goals Yes   PT LONG TERM GOAL #6   Title Pt will negotiate 4 stairs with single rail and supervision to increase pt independence with community mobility. Target date: 04/05/15.   Time 8   Period Weeks   Status New   PT LONG TERM GOAL #7   Title Pt will increase gait speed from 1.02 ft/sec to 1.62 ft/sec to indicate significant improvement in efficiency of ambulation. Target date: 04/05/15.   Time 8   Period Weeks   Status New               Plan - 02/19/15 1710    Clinical Impression Statement  Pt continues with decreased endurance and continues to use w/c for most mobility.  Continue PT per POC.  Pt will benefit from skilled therapeutic intervention in order to improve on the following deficits Abnormal gait;Decreased activity tolerance;Decreased balance;Decreased mobility;Decreased range of motion;Decreased safety awareness;Difficulty walking;Decreased strength;Impaired tone;Decreased knowledge of use of DME;Decreased endurance;Decreased coordination;Impaired flexibility;Impaired perceived functional ability;Postural dysfunction;Impaired sensation;Impaired UE functional use;Impaired vision/preception;Decreased cognition;Pain   Rehab Potential Good   PT Frequency 2x / week   PT Duration 8 weeks   PT Treatment/Interventions ADLs/Self Care Home Management;Therapeutic activities;Functional mobility training;Stair training;Gait training;Therapeutic exercise;Balance training;Neuromuscular re-education;Patient/family education;DME Instruction;Cognitive remediation;Orthotic Fit/Training;Manual techniques;Passive range of motion;Visual/perceptual remediation/compensation   PT Next Visit Plan L hip flexor stretching, gait on outdoor surfaces, curb and ramp   Consulted and Agree with Plan of Care Patient   Family Member Consulted wife        Problem List Patient Active Problem List   Diagnosis Date Noted  . Spastic hemiparesis affecting nondominant side 11/08/2014  . Hypothyroidism 10/27/2014  . Left-sided neglect 09/23/2014  . Acute ischemic right MCA stroke 09/16/2014  . Left hemiparesis   . CVA (cerebral vascular accident)   . Right carotid artery occlusion   . Acute right MCA stroke   . Essential hypertension 09/12/2014  . CVA (cerebral infarction) 09/12/2014  . Type 2 diabetes mellitus with peripheral neuropathy 09/12/2014    Narda Bonds 02/19/2015, 5:15 PM  [facility] [address] [place], [place],  [address] Phone: [phone]   Fax:  [facility], Seth Ward 02/19/2015 5:20 PM Phone: [phone] Fax: [phone]

## 2015-02-21 ENCOUNTER — Ambulatory Visit: Payer: 59 | Admitting: Physical Therapy

## 2015-02-21 ENCOUNTER — Ambulatory Visit: Payer: 59 | Admitting: Occupational Therapy

## 2015-02-21 ENCOUNTER — Encounter: Payer: Self-pay | Admitting: Occupational Therapy

## 2015-02-21 DIAGNOSIS — G8194 Hemiplegia, unspecified affecting left nondominant side: Secondary | ICD-10-CM | POA: Diagnosis not present

## 2015-02-21 DIAGNOSIS — Z7409 Other reduced mobility: Secondary | ICD-10-CM

## 2015-02-21 DIAGNOSIS — R201 Hypoesthesia of skin: Secondary | ICD-10-CM

## 2015-02-21 DIAGNOSIS — R414 Neurologic neglect syndrome: Secondary | ICD-10-CM

## 2015-02-21 DIAGNOSIS — R4189 Other symptoms and signs involving cognitive functions and awareness: Secondary | ICD-10-CM

## 2015-02-21 DIAGNOSIS — R269 Unspecified abnormalities of gait and mobility: Secondary | ICD-10-CM

## 2015-02-21 NOTE — Therapy (Signed)
[facility] [address] [place], [place], [address] Phone: [phone]   Fax:  [phone]  Occupational Therapy Treatment  Patient Details  Name: Brian Hall MRN: [id number] Date of Birth: 02-04-1956 Referring Provider:  Claretta Fraise, MD  Encounter Date: 02/21/2015      OT End of Session - 02/21/15 1252    Visit Number 18   Number of Visits 32   Date for OT Re-Evaluation 04/11/15   Authorization Type [employer]   OT Start Time 1145   OT Stop Time 1231   OT Time Calculation (min) 46 min   Activity Tolerance Patient tolerated treatment well      Past Medical History  Diagnosis Date  . Hypertension   . Diabetes mellitus   . Obesity   . CHF (congestive heart failure)   . SOB (shortness of breath)   . Hyperkalemia   . Renal insufficiency   . Cardiogenic shock   . Cardiac LV ejection fraction 10-20%   . Hyperlipemia   . Hypothyroidism   . Throat cancer 2011    s/p neck dissection, radiation, chemo  . Stroke   . Blurred vision   . Joint pain   . Joint swelling     Past Surgical History  Procedure Laterality Date  . Cardiac catheterization    . Neck dissection Right 2011    s/p resection of throat cancer with multiple nodes removed    There were no vitals filed for this visit.  Visit Diagnosis:  Hemiplegia affecting left nondominant side  Impaired functional mobility and activity tolerance  Impaired sensation  Left-sided neglect  Impaired cognition      Subjective Assessment - 02/21/15 1149    Subjective  My wrist is doing better and we are doing the activities with my arm every day at home.   Patient is accompained by: Family member   Pertinent History see epic snapshot   Patient Stated Goals Get back to doing some of the things I used to do.   Currently in Pain? Yes   Pain Score 5    Pain Location Ankle   Pain Orientation Left   Pain Descriptors / Indicators Burning  stinging   Pain Type Chronic pain   Pain Onset More than a month ago   Pain Frequency Intermittent   Aggravating Factors  prolonged sitting, brace   Pain Relieving Factors repositioning and moving around                      OT Treatments/Exercises (OP) - 02/21/15 0001    Neurological Re-education Exercises   Other Exercises 1 Neuro re ed to address pelvic mobility in sitting and standing to allow improved trunk/scap alignment prior to working on low reach.  Pt with improvment. Also addressed additonal technique to reduce UE tone in wrist and hand - pt and wife able to return demonstrate and stressed gente stretch within pain free range. Progressed to low bilateral reach with ball to focus on open hand, wirst extension and ability to move in and out of elbow flexion/extension as well as shoulder flexion/extension while overriding tone influence. Pt then progressed to low unilateral reach with LUE with mod facilitation.                  OT Short Term Goals - 02/21/15 1250    OT SHORT TERM GOAL #1   Title Pt and wife will be mod I with HEP - 01/18/2015.  Goal dates adjusted as pt will not return to clinic prior to 12/21/14   Status Achieved   OT SHORT TERM GOAL #2   Title Pt will require min a for LB dressing   Status Achieved   OT SHORT TERM GOAL #3   Title Pt will demonstrate ability to use LUE as gross assist during basic self care 50% of the time.   Status Partially Met   OT SHORT TERM GOAL #4   Title Pt will need no more than min vc's to compensate for L neglect during functional mobility tasks   Status Achieved   OT SHORT TERM GOAL #5   Title Pt will demonstrate ability to attend to familiar functional task for 15 minute with no more than one vc.   Status Achieved   OT SHORT TERM GOAL #6   Title Pt will require no more than min assist for toilet transfers   Status Achieved   OT SHORT TERM GOAL #7   Title Pt and wife will be mod I with upgraded HEP prn - 03/14/2015    Status On-going   OT SHORT TERM GOAL #8   Title Pt will be able to use LUE as gross assist 75% of the time for basic ADL tasks with min cues   Status On-going   OT SHORT TERM GOAL  #9   TITLE Pt will require supervision for tub bench transfers    Status On-going   OT SHORT TERM GOAL  #10   TITLE Pt will require min a for simple snack prep at ambulatory level   Status On-going           OT Long Term Goals - 02/21/15 1250    OT LONG TERM GOAL #1   Title Pt and wife will be mod I with upgraded HEP - 02/15/2015. Goal date adjusted as pt will not return to clinic prior to 12/21/2014   Status Achieved   OT LONG TERM GOAL #2   Title Pt will demonstrate ability to use LUE as gross assist 100% of the time in basic self care tasks   Status Not Met   OT LONG TERM GOAL #3   Title Pt will demonstrate ability to hold cylindrical object in L hand.    Status Achieved   OT LONG TERM GOAL #4   Title Pt will verbalize understanding of AE for buttoning, zipping and cutting   Status Achieved   OT LONG TERM GOAL #5   Title Pt will be supervision for toilet transfers   Status Achieved   OT LONG TERM GOAL #6   Title Pt will be min a for shower transfers   Status Achieved   OT LONG TERM GOAL #7   Title Pt will  be supervision for simple hot meal prep   Status Deferred   OT LONG TERM GOAL #8   Title Pt will demonstrate the ability to use LUE as gross a for basic ADL's 100% of the time - 04/11/2015   Status On-going   OT LONG TERM GOAL  #9   Baseline Pt will be mod I for toilet transfers   Status On-going   OT LONG TERM GOAL  #10   TITLE Pt will be supervision for simple hot meal prep   Status On-going   OT LONG TERM GOAL  #11   TITLE Pt will demonstrate ability for blilateral mid reach for functional tasks   Status On-going  Plan - 02/21/15 1251    Clinical Impression Statement Pt continues to make excellent gains in functional mobility, isolated movement of LUE, grading  and more normal movement patterns. Pt and wife extremely motivated.   Pt will benefit from skilled therapeutic intervention in order to improve on the following deficits (Retired) Decreased balance;Decreased coordination;Decreased cognition;Decreased knowledge of use of DME;Decreased mobility;Decreased range of motion;Decreased safety awareness;Difficulty walking;Decreased strength;Increased edema;Increased muscle spasms;Impaired UE functional use;Impaired tone;Impaired sensation;Impaired vision/preception;Pain   Rehab Potential Good   Clinical Impairments Affecting Rehab Potential Pt will benefit from skilled OT to address deficits outlined in eval to maximize independence.   OT Frequency 2x / week   OT Duration 8 weeks   OT Treatment/Interventions Self-care/ADL training;Cryotherapy;Moist Heat;Electrical Stimulation;Fluidtherapy;DME and/or AE instruction;Energy conservation;Neuromuscular education;Therapeutic exercise;Functional Mobility Training;Manual Therapy;Passive range of motion;Splinting;Therapeutic activities;Balance training;Patient/family education;Cognitive remediation/compensation   Plan neuro re ed to LUE, balance, functional mobility   OT Home Exercise Plan issued inital HEP-12/27/14, cane shoulder flexion 01/03/15   Consulted and Agree with Plan of Care Patient;Family member/caregiver   Family Member Consulted wife        Problem List Patient Active Problem List   Diagnosis Date Noted  . Spastic hemiparesis affecting nondominant side 11/08/2014  . Hypothyroidism 10/27/2014  . Left-sided neglect 09/23/2014  . Acute ischemic right MCA stroke 09/16/2014  . Left hemiparesis   . CVA (cerebral vascular accident)   . Right carotid artery occlusion   . Acute right MCA stroke   . Essential hypertension 09/12/2014  . CVA (cerebral infarction) 09/12/2014  . Type 2 diabetes mellitus with peripheral neuropathy 09/12/2014    Quay Burow, OTR/L 02/21/2015, 12:54 PM  [facility] [address] [place], [place], [address] Phone: [phone]   Fax:  [phone]

## 2015-02-21 NOTE — Therapy (Addendum)
[facility] [address] [place], [place], [address] Phone: [phone]   Fax:  [phone]  Physical Therapy Treatment  Patient Details  Name: Brian Hall MRN: [id number] Date of Birth: 12/15/1955 Referring Provider:  Claretta Fraise, MD  Encounter Date: 02/21/2015      PT End of Session - 02/21/15 1327    Visit Number 17   Number of Visits 33   Date for PT Re-Evaluation 04/08/15   PT Start Time 1020   PT Stop Time 1103   PT Time Calculation (min) 43 min   Equipment Utilized During Treatment Gait belt   Activity Tolerance Patient tolerated treatment well   Behavior During Therapy [facility] for tasks assessed/performed      Past Medical History  Diagnosis Date  . Hypertension   . Diabetes mellitus   . Obesity   . CHF (congestive heart failure)   . SOB (shortness of breath)   . Hyperkalemia   . Renal insufficiency   . Cardiogenic shock   . Cardiac LV ejection fraction 10-20%   . Hyperlipemia   . Hypothyroidism   . Throat cancer 2011    s/p neck dissection, radiation, chemo  . Stroke   . Blurred vision   . Joint pain   . Joint swelling     Past Surgical History  Procedure Laterality Date  . Cardiac catheterization    . Neck dissection Right 2011    s/p resection of throat cancer with multiple nodes removed    There were no vitals filed for this visit.  Visit Diagnosis:  Hemiplegia affecting left nondominant side  Impaired functional mobility and activity tolerance  Abnormality of gait      Subjective Assessment - 02/21/15 1028    Subjective Pt reporting discomfort in L ankle. Per wife, L ankle "curls up" (wife demostrating L forefoot supination) when pt standing at home.    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  He has a motorcycle and would like to ride it  again.    Currently in Pain? Yes   Pain Score 5    Pain Location Hip   Pain Orientation Left   Pain Descriptors / Indicators Sore   Pain Type Chronic pain   Pain Onset More than a month ago   Pain Frequency Intermittent   Aggravating Factors  prolonged sitting   Pain Relieving Factors repositioning; moving around      Treatment Removed heel wedge from L shoe to decrease movement pattern associated with LLE extensor tone.  Gait Training: - Gait 2 [phone] over level, indoor surfaces with SBQC and min A, multimodal cueing to address L hip retraction and LLE adduction. Noted minimal L genu recurvatum with increased fatigue/distance ambulated.  Neuro Re-ed: The following interventions were performed with NMES on L peroneal muscle group to facilitate L subtalar eversion to counteract forefoot supination. - Seated EOM: active L subtalar eversion with manual assist 2 x10 reps to promote L foot clearance during gait. - Supine LLE D2 flexion/extension x15 reps, x10 reps rhythmic initiation with multimodal cueing focused on selective control of L ankle dorsiflexion/eversion while maintaining L knee extension, initiating L knee flexion. PNF also addressed extensor synergy in LLE. - Instructed wife in prolonged passive stretch of L ankle plantarflexors x60-second holds (2-3 sets per day) for LLE tone management with  verbal understanding.         PT Education - 02/21/15 1324    Education provided Yes   Education Details Instructed wife in prolonged passive stretch of L ankle plantarflexors x60 seconds (as opposed to 5-seconds in duration). Recommending no gait without L AFO to prevent L ankle injury. Recommend prolonged static standing at countertop to address L LE.   Person(s) Educated Patient;Spouse   Methods Explanation;Demonstration   Comprehension Returned demonstration;Verbalized understanding          PT Short Term Goals - 02/21/15 1333    PT SHORT TERM GOAL #1   Title Pt will  perform HEP with supervision of family, for improved strength and balance. (Target 01/10/15)   Time 4   Period Weeks   Status Achieved   PT SHORT TERM GOAL #2   Title Pt will improve Timed UP and Go score to less than or equal to 35 seconds for decreased fall risk.   Baseline eval-46.78 sec   Time 4   Period Weeks   Status Not Met  46.81 with quad cane and close supervision and occasional CGA   PT SHORT TERM GOAL #3   Title Berg Balance score to be assessed, with pt scoring at least 5 additional points to decrease fall risk.   Time 4   Period Weeks   Status Achieved  Scored 33 today   PT SHORT TERM GOAL #4   Title Pt will perform at least 6 of 10 reps of sit<>stand transfers no UE support, with equal weightbearing through bilateral lower extremities, independently.   Time 4   Period Weeks   Status Achieved   PT SHORT TERM GOAL #5   Title Pt will improve TUG time from 29.69 sec to 25 seconds for decreased fall risk. Target date: 03/08/15.   Time 4   Period Weeks   Status New   PT SHORT TERM GOAL #6   Title Pt will ambulate x200' over level surfaces with [facility] and supervision with no overt LOB. Target date: 03/08/15.   Time 4   Period Weeks   Status New           PT Long Term Goals - 02/21/15 1333    PT LONG TERM GOAL #1   Title Pt will verbalize understanding of fall prevention within home environment. Target 02/10/15   Time 8   Period Weeks   Status Achieved   PT LONG TERM GOAL #2   Title Pt will improve TUG to less than or equal to 25 seconds for decreased fall risk.   Time 8   Status Not Met  Unmet goal will continue as short-term goal   PT LONG TERM GOAL #3   Title Pt will improve gait velocity to at least 1 ft/sec for improved gait efficiency and safety.   Time 8   Period Weeks   Status Achieved   PT LONG TERM GOAL #4   Title Pt will ambulate at least 50-100 ft using hemiwalker, household distances, with supervision.   Time 8   Period Weeks   Status Achieved    PT LONG TERM GOAL #5   Title Pt will ambulate 150' over outdoor, unlevel surfaces with LRAD and supervision to indicate increased stability/independence with community mobility. Target date: 04/05/15.   Time 8   Period Weeks   Status New   PT LONG TERM GOAL #6   Title Pt will negotiate 4 stairs with single rail and supervision to increase pt independence with  community mobility. Target date: 04/05/15.   Time 8   Period Weeks   Status New   PT LONG TERM GOAL #7   Title Pt will increase gait speed from 1.02 ft/sec to 1.62 ft/sec to indicate significant improvement in efficiency of ambulation. Target date: 04/05/15.   Time 8   Period Weeks   Status New               Plan - 02/21/15 1327    Clinical Impression Statement Session focused on addressing L foot posture, tone to promote safety with functional L LE movement. L ankle discomfort, supinated position appears to be due to LLE extensor tone as well as functional return of L anterior tib muscle without peroneal activation. Removed heel wedge from L shoe and attempted NMES on L peroneal muscle group during LLE PNF; however, extensor tone inhibiting functional L ankle dorsiflexion. Continue per POC.   Pt will benefit from skilled therapeutic intervention in order to improve on the following deficits Abnormal gait;Decreased activity tolerance;Decreased balance;Decreased mobility;Decreased range of motion;Decreased safety awareness;Difficulty walking;Decreased strength;Impaired tone;Decreased knowledge of use of DME;Decreased endurance;Decreased coordination;Impaired flexibility;Impaired perceived functional ability;Postural dysfunction;Impaired sensation;Impaired UE functional use;Impaired vision/preception;Decreased cognition;Pain   Rehab Potential Good   PT Frequency 2x / week   PT Duration 8 weeks   PT Treatment/Interventions ADLs/Self Care Home Management;Therapeutic activities;Functional mobility training;Stair training;Gait  training;Therapeutic exercise;Balance training;Neuromuscular re-education;Patient/family education;DME Instruction;Cognitive remediation;Orthotic Fit/Training;Manual techniques;Passive range of motion;Visual/perceptual remediation/compensation   PT Next Visit Plan Trial supination control strap on L AFO. Prolonged passive stretch of L ankle plantarflexors. NMES on L peroneals, dorsiflexors. NMR for proximal stability (tall kneeling).   Consulted and Agree with Plan of Care Patient;Family member/caregiver   Family Member Consulted wife        Problem List Patient Active Problem List   Diagnosis Date Noted  . Spastic hemiparesis affecting nondominant side 11/08/2014  . Hypothyroidism 10/27/2014  . Left-sided neglect 09/23/2014  . Acute ischemic right MCA stroke 09/16/2014  . Left hemiparesis   . CVA (cerebral vascular accident)   . Right carotid artery occlusion   . Acute right MCA stroke   . Essential hypertension 09/12/2014  . CVA (cerebral infarction) 09/12/2014  . Type 2 diabetes mellitus with peripheral neuropathy 09/12/2014   Billie Ruddy, PT, DPT [facility] [address] [place], [place], [address] Phone: [phone]   Fax:  [phone] 02/21/2015, 1:43 PM

## 2015-02-22 ENCOUNTER — Encounter: Payer: 59 | Admitting: Occupational Therapy

## 2015-02-22 ENCOUNTER — Ambulatory Visit: Payer: 59

## 2015-02-23 ENCOUNTER — Ambulatory Visit: Payer: 59 | Admitting: Physical Therapy

## 2015-02-23 ENCOUNTER — Ambulatory Visit: Payer: 59 | Admitting: Occupational Therapy

## 2015-02-23 ENCOUNTER — Encounter: Payer: Self-pay | Admitting: Occupational Therapy

## 2015-02-23 DIAGNOSIS — R278 Other lack of coordination: Secondary | ICD-10-CM

## 2015-02-23 DIAGNOSIS — G8114 Spastic hemiplegia affecting left nondominant side: Secondary | ICD-10-CM

## 2015-02-23 DIAGNOSIS — Z7409 Other reduced mobility: Secondary | ICD-10-CM

## 2015-02-23 DIAGNOSIS — G8194 Hemiplegia, unspecified affecting left nondominant side: Secondary | ICD-10-CM | POA: Diagnosis not present

## 2015-02-23 DIAGNOSIS — R269 Unspecified abnormalities of gait and mobility: Secondary | ICD-10-CM

## 2015-02-23 NOTE — Therapy (Signed)
[facility] [address] [place], [place], [address] Phone: [phone]   Fax:  [phone]  Physical Therapy Treatment  Patient Details  Name: Brian Hall MRN: [id number] Date of Birth: 28-Dec-1955 Referring Provider:  Claretta Fraise, MD  Encounter Date: 02/23/2015      PT End of Session - 02/23/15 1759    Visit Number 18   Number of Visits 33   Date for PT Re-Evaluation 04/08/15   PT Start Time 1402   PT Stop Time 1445   PT Time Calculation (min) 43 min   Equipment Utilized During Treatment Gait belt   Activity Tolerance Patient tolerated treatment well   Behavior During Therapy [facility] for tasks assessed/performed      Past Medical History  Diagnosis Date  . Hypertension   . Diabetes mellitus   . Obesity   . CHF (congestive heart failure)   . SOB (shortness of breath)   . Hyperkalemia   . Renal insufficiency   . Cardiogenic shock   . Cardiac LV ejection fraction 10-20%   . Hyperlipemia   . Hypothyroidism   . Throat cancer 2011    s/p neck dissection, radiation, chemo  . Stroke   . Blurred vision   . Joint pain   . Joint swelling     Past Surgical History  Procedure Laterality Date  . Cardiac catheterization    . Neck dissection Right 2011    s/p resection of throat cancer with multiple nodes removed    There were no vitals filed for this visit.  Visit Diagnosis:  Left spastic hemiplegia  Abnormality of gait  Coordination abnormal      Subjective Assessment - 02/23/15 1416    Currently in Pain? Yes   Pain Score 5    Pain Location Ankle   Pain Orientation Left   Pain Descriptors / Indicators Burning   Pain Type Chronic pain   Pain Onset More than a month ago   Pain Frequency Intermittent   Aggravating Factors  wearing brace   Pain Relieving Factors reposiitoning, moving around         Treatment   Neuro Re-ed: - Seated EOM, pt performed L ankle dorsiflexion AAROM with  manual assist, NMES on L peroneal muscle group in attempt L ankle dorsiflexion without onset of tone causing excessive L forefoot supination. Noted pt ability to initiate L ankle dorsiflexion and eversion; however, motion consistently impeded by onset of tone.  Gait Training: - Pt performed gait 2 x115' with SBQC and min guard to min A for majority of gait. +2A required to recover from significant LOB posterior and to R due to pt having stepped on object on L side. During initial trial, pt wearing personal L AFO (Ottock Reaction) without supination control strap. During subsequent trial, pt wearing personal L AFO with added supination control strap. During final trial, pt wore L Allard Blue Rocker AFO for increased proximal stability and to trial effect of lateral strut on L foot/ankle posture. Pt noted improved comfort, positioning with use of supination control strap on personal L AFO (Ottobock Reaction). Most improvement  in ankle pain and gait stability noted with use of L Blue Rocker AFO; however, forefoot supination due to LLE tone still prominent with use of Blue Rocker.  Self Care: - See Patient Education section for details.                         PT  Education - 02/23/15 1757    Education provided Yes   Education Details Options for tone management with focus on bracing (custom AFO) vs medical management (e.g. botox).   Person(s) Educated Patient;Spouse   Methods Explanation   Comprehension Verbalized understanding          PT Short Term Goals - 02/21/15 1333    PT SHORT TERM GOAL #1   Title Pt will perform HEP with supervision of family, for improved strength and balance. (Target 01/10/15)   Time 4   Period Weeks   Status Achieved   PT SHORT TERM GOAL #2   Title Pt will improve Timed UP and Go score to less than or equal to 35 seconds for decreased fall risk.   Baseline eval-46.78 sec   Time 4   Period Weeks   Status Not Met  46.81 with quad cane and  close supervision and occasional CGA   PT SHORT TERM GOAL #3   Title Berg Balance score to be assessed, with pt scoring at least 5 additional points to decrease fall risk.   Time 4   Period Weeks   Status Achieved  Scored 33 today   PT SHORT TERM GOAL #4   Title Pt will perform at least 6 of 10 reps of sit<>stand transfers no UE support, with equal weightbearing through bilateral lower extremities, independently.   Time 4   Period Weeks   Status Achieved   PT SHORT TERM GOAL #5   Title Pt will improve TUG time from 29.69 sec to 25 seconds for decreased fall risk. Target date: 03/08/15.   Time 4   Period Weeks   Status New   PT SHORT TERM GOAL #6   Title Pt will ambulate x200' over level surfaces with [facility] and supervision with no overt LOB. Target date: 03/08/15.   Time 4   Period Weeks   Status New           PT Long Term Goals - 02/21/15 1333    PT LONG TERM GOAL #1   Title Pt will verbalize understanding of fall prevention within home environment. Target 02/10/15   Time 8   Period Weeks   Status Achieved   PT LONG TERM GOAL #2   Title Pt will improve TUG to less than or equal to 25 seconds for decreased fall risk.   Time 8   Status Not Met  Unmet goal will continue as short-term goal   PT LONG TERM GOAL #3   Title Pt will improve gait velocity to at least 1 ft/sec for improved gait efficiency and safety.   Time 8   Period Weeks   Status Achieved   PT LONG TERM GOAL #4   Title Pt will ambulate at least 50-100 ft using hemiwalker, household distances, with supervision.   Time 8   Period Weeks   Status Achieved   PT LONG TERM GOAL #5   Title Pt will ambulate 150' over outdoor, unlevel surfaces with LRAD and supervision to indicate increased stability/independence with community mobility. Target date: 04/05/15.   Time 8   Period Weeks   Status New   PT LONG TERM GOAL #6   Title Pt will negotiate 4 stairs with single rail and supervision to increase pt independence with  community mobility. Target date: 04/05/15.   Time 8   Period Weeks   Status New   PT LONG TERM GOAL #7   Title Pt will increase gait speed from 1.02 ft/sec to  1.62 ft/sec to indicate significant improvement in efficiency of ambulation. Target date: 04/05/15.   Time 8   Period Weeks   Status New               Plan - 02/23/15 1746    Clinical Impression Statement Pt noted improved comfort, positioning with use of supination control strap on personal L AFO (Ottobock Reaction). Most improvement noted in ankle pain and gait stability noted with use of L Blue Rocker AFO; however, forefoot supination still prominent with use of Blue Rocker. Pt/wife in agreement with this PT speaking to referring MD about additional intervention (botox) for tone management. Continue per POC.   Pt will benefit from skilled therapeutic intervention in order to improve on the following deficits Abnormal gait;Decreased activity tolerance;Decreased balance;Decreased mobility;Decreased range of motion;Decreased safety awareness;Difficulty walking;Decreased strength;Impaired tone;Decreased knowledge of use of DME;Decreased endurance;Decreased coordination;Impaired flexibility;Impaired perceived functional ability;Postural dysfunction;Impaired sensation;Impaired UE functional use;Impaired vision/preception;Decreased cognition;Pain   Rehab Potential Good   PT Frequency 2x / week   PT Duration 8 weeks   PT Treatment/Interventions ADLs/Self Care Home Management;Therapeutic activities;Functional mobility training;Stair training;Gait training;Therapeutic exercise;Balance training;Neuromuscular re-education;Patient/family education;DME Instruction;Cognitive remediation;Orthotic Fit/Training;Manual techniques;Passive range of motion;Visual/perceptual remediation/compensation   PT Next Visit Plan NMR for proximal stability (tall kneeling). LLE NMR with focus on return of L ankle dorsiflexion. Decrease synergistic movement in LLE.    Recommended Other Services Speak with referring MD about whether or not botox would be appropriate intervention to address spasticity in L ankle causing excessive L forefoot supination, ankle pain. Pursue supination control strap for improved positioning, pain management.   Consulted and Agree with Plan of Care Patient;Family member/caregiver   Family Member Consulted wife        Problem List Patient Active Problem List   Diagnosis Date Noted  . Spastic hemiparesis affecting nondominant side 11/08/2014  . Hypothyroidism 10/27/2014  . Left-sided neglect 09/23/2014  . Acute ischemic right MCA stroke 09/16/2014  . Left hemiparesis   . CVA (cerebral vascular accident)   . Right carotid artery occlusion   . Acute right MCA stroke   . Essential hypertension 09/12/2014  . CVA (cerebral infarction) 09/12/2014  . Type 2 diabetes mellitus with peripheral neuropathy 09/12/2014   Billie Ruddy, PT, DPT [facility] [address] [place], [place], [address] Phone: [phone]   Fax:  [phone] 02/23/2015, 6:09 PM

## 2015-02-23 NOTE — Therapy (Signed)
[facility] [address] [place], [place], [address] Phone: [phone]   Fax:  [phone]  Occupational Therapy Treatment  Patient Details  Name: Brian Hall MRN: [id number] Date of Birth: 09/15/1955 Referring Provider:  Claretta Fraise, MD  Encounter Date: 02/23/2015      OT End of Session - 02/23/15 1207    Visit Number 19   Number of Visits 32   Date for OT Re-Evaluation 04/11/15   Authorization Type [employer]   OT Start Time 1100   OT Stop Time 1150   OT Time Calculation (min) 50 min   Activity Tolerance Patient tolerated treatment well      Past Medical History  Diagnosis Date  . Hypertension   . Diabetes mellitus   . Obesity   . CHF (congestive heart failure)   . SOB (shortness of breath)   . Hyperkalemia   . Renal insufficiency   . Cardiogenic shock   . Cardiac LV ejection fraction 10-20%   . Hyperlipemia   . Hypothyroidism   . Throat cancer 2011    s/p neck dissection, radiation, chemo  . Stroke   . Blurred vision   . Joint pain   . Joint swelling     Past Surgical History  Procedure Laterality Date  . Cardiac catheterization    . Neck dissection Right 2011    s/p resection of throat cancer with multiple nodes removed    There were no vitals filed for this visit.  Visit Diagnosis:  Hemiplegia affecting left nondominant side  Impaired functional mobility and activity tolerance      Subjective Assessment - 02/23/15 1107    Patient is accompained by: Family member   Pertinent History see epic snapshot   Patient Stated Goals Get back to doing some of the things I used to do.   Currently in Pain? Yes   Pain Score 3    Pain Location Ankle   Pain Orientation Left   Pain Descriptors / Indicators Burning   Pain Type Chronic pain   Pain Onset More than a month ago   Pain Frequency Intermittent   Aggravating Factors  prolonged sitting, brace   Pain Relieving Factors repositionng  and moving around                      OT Treatments/Exercises (OP) - 02/23/15 0001    ADLs   Toileting Pt is unable to do hygiene at home. Practiced and problem solved with pt and wife various strategies that will allow pt to complete hygiene in standing with close supervision from wife. Pt and wife both able to return demonstrate simulated hygiene given restrictions in their home bathroom..   Functional Mobility Addressed functional ambulation with pt and wife related to entering and exiting bathroom, functional ambulation with task, with emphasis on improved postural control and alignment, weight shifting, and safety.                   OT Short Term Goals - 02/23/15 1159    OT SHORT TERM GOAL #1   Title Pt and wife will be mod I with HEP - 01/18/2015.  Goal dates adjusted as pt will not return to clinic prior to 12/21/14   Status Achieved   OT SHORT TERM GOAL #2   Title Pt will require min a for LB dressing   Status Achieved   OT SHORT TERM GOAL #3   Title Pt will demonstrate ability  to use LUE as gross assist during basic self care 50% of the time.   Status Partially Met   OT SHORT TERM GOAL #4   Title Pt will need no more than min vc's to compensate for L neglect during functional mobility tasks   Status Achieved   OT SHORT TERM GOAL #5   Title Pt will demonstrate ability to attend to familiar functional task for 15 minute with no more than one vc.   Status Achieved   OT SHORT TERM GOAL #6   Title Pt will require no more than min assist for toilet transfers   Status Achieved   OT SHORT TERM GOAL #7   Title Pt and wife will be mod I with upgraded HEP prn - 03/14/2015   Status On-going   OT SHORT TERM GOAL #8   Title Pt will be able to use LUE as gross assist 75% of the time for basic ADL tasks with min cues   Status On-going   OT SHORT TERM GOAL  #9   TITLE Pt will require supervision for tub bench transfers    Status On-going   OT SHORT TERM GOAL  #10    TITLE Pt will require min a for simple snack prep at ambulatory level   Status On-going           OT Long Term Goals - 02/23/15 1205    OT LONG TERM GOAL #1   Title Pt and wife will be mod I with upgraded HEP - 02/15/2015. Goal date adjusted as pt will not return to clinic prior to 12/21/2014   Status Achieved   OT LONG TERM GOAL #2   Title Pt will demonstrate ability to use LUE as gross assist 100% of the time in basic self care tasks   Status Not Met   OT LONG TERM GOAL #3   Title Pt will demonstrate ability to hold cylindrical object in L hand.    Status Achieved   OT LONG TERM GOAL #4   Title Pt will verbalize understanding of AE for buttoning, zipping and cutting   Status Achieved   OT LONG TERM GOAL #5   Title Pt will be supervision for toilet transfers   Status Achieved   OT LONG TERM GOAL #6   Title Pt will be min a for shower transfers   Status Achieved   OT LONG TERM GOAL #7   Title Pt will  be supervision for simple hot meal prep   Status Deferred   OT LONG TERM GOAL #8   Title Pt will demonstrate the ability to use LUE as gross a for basic ADL's 100% of the time - 04/11/2015   Status On-going   OT LONG TERM GOAL  #9   Baseline Pt will be mod I for toilet transfers   Status On-going   OT LONG TERM GOAL  #10   TITLE Pt will be supervision for simple hot meal prep   Status On-going   OT LONG TERM GOAL  #11   TITLE Pt will demonstrate ability for blilateral mid reach for functional tasks   Status On-going               Plan - 02/23/15 1206    Clinical Impression Statement Pt continues to make progress toward all goals. Pt is extremely motivated and wife is very involved and supportive.   Pt will benefit from skilled therapeutic intervention in order to improve on the following deficits (Retired) Decreased  balance;Decreased coordination;Decreased cognition;Decreased knowledge of use of DME;Decreased mobility;Decreased range of motion;Decreased safety  awareness;Difficulty walking;Decreased strength;Increased edema;Increased muscle spasms;Impaired UE functional use;Impaired tone;Impaired sensation;Impaired vision/preception;Pain   Rehab Potential Good   Clinical Impairments Affecting Rehab Potential Pt will benefit from skilled OT to address deficits outlined in eval to maximize independence.   OT Frequency 2x / week   OT Duration 8 weeks   OT Treatment/Interventions Self-care/ADL training;Cryotherapy;Moist Heat;Electrical Stimulation;Fluidtherapy;DME and/or AE instruction;Energy conservation;Neuromuscular education;Therapeutic exercise;Functional Mobility Training;Manual Therapy;Passive range of motion;Splinting;Therapeutic activities;Balance training;Patient/family education;Cognitive remediation/compensation   Plan neuro re ed to LUE, functional mobiltiy   Consulted and Agree with Plan of Care Patient;Family member/caregiver   Family Member Consulted wife        Problem List Patient Active Problem List   Diagnosis Date Noted  . Spastic hemiparesis affecting nondominant side 11/08/2014  . Hypothyroidism 10/27/2014  . Left-sided neglect 09/23/2014  . Acute ischemic right MCA stroke 09/16/2014  . Left hemiparesis   . CVA (cerebral vascular accident)   . Right carotid artery occlusion   . Acute right MCA stroke   . Essential hypertension 09/12/2014  . CVA (cerebral infarction) 09/12/2014  . Type 2 diabetes mellitus with peripheral neuropathy 09/12/2014    Quay Burow, OTR/L 02/23/2015, 12:08 PM  [facility] [address] [place], [place], [address] Phone: [phone]   Fax:  [phone]

## 2015-02-24 ENCOUNTER — Telehealth: Payer: Self-pay | Admitting: Physical Therapy

## 2015-02-24 NOTE — Telephone Encounter (Signed)
Dr. Letta Pate,  During mobility, Eural Holzschuh has significant tone in his L ankle that causes L forefoot supination. He is now able to activate L anterior tib, but dorsiflexion gets overpowered by this tone every time he takes a step. It's also causing him increasingly more L ankle pain and he's having difficulty wearing his AFO due to his foot/ankle posture.  Would botox be appropriate for this patient?  Thanks so much, Billie Ruddy, PT, DPT [facility] [address] [place], [place], [address] Phone: [phone]   Fax:  [phone] 02/24/2015, 7:48 AM

## 2015-02-28 ENCOUNTER — Encounter: Payer: Self-pay | Admitting: Occupational Therapy

## 2015-02-28 ENCOUNTER — Ambulatory Visit: Payer: 59 | Admitting: Physical Therapy

## 2015-02-28 ENCOUNTER — Ambulatory Visit: Payer: 59 | Admitting: Occupational Therapy

## 2015-02-28 DIAGNOSIS — G8194 Hemiplegia, unspecified affecting left nondominant side: Secondary | ICD-10-CM

## 2015-02-28 DIAGNOSIS — G8114 Spastic hemiplegia affecting left nondominant side: Secondary | ICD-10-CM

## 2015-02-28 DIAGNOSIS — Z7409 Other reduced mobility: Secondary | ICD-10-CM

## 2015-02-28 DIAGNOSIS — R201 Hypoesthesia of skin: Secondary | ICD-10-CM

## 2015-02-28 DIAGNOSIS — R4189 Other symptoms and signs involving cognitive functions and awareness: Secondary | ICD-10-CM

## 2015-02-28 DIAGNOSIS — R278 Other lack of coordination: Secondary | ICD-10-CM

## 2015-02-28 DIAGNOSIS — R269 Unspecified abnormalities of gait and mobility: Secondary | ICD-10-CM

## 2015-02-28 NOTE — Therapy (Signed)
[facility] [address] [place], [place], [address] Phone: [phone]   Fax:  [phone]  Occupational Therapy Treatment  Patient Details  Name: Brian Hall MRN: [id number] Date of Birth: 12-20-1955 Referring Provider:  Claretta Fraise, MD  Encounter Date: 02/28/2015      OT End of Session - 02/28/15 1728    Visit Number 20   Number of Visits 32   Date for OT Re-Evaluation 04/11/15   Authorization Type [employer]   OT Start Time 1616   OT Stop Time 1700   OT Time Calculation (min) 44 min   Activity Tolerance Patient tolerated treatment well      Past Medical History  Diagnosis Date  . Hypertension   . Diabetes mellitus   . Obesity   . CHF (congestive heart failure)   . SOB (shortness of breath)   . Hyperkalemia   . Renal insufficiency   . Cardiogenic shock   . Cardiac LV ejection fraction 10-20%   . Hyperlipemia   . Hypothyroidism   . Throat cancer 2011    s/p neck dissection, radiation, chemo  . Stroke   . Blurred vision   . Joint pain   . Joint swelling     Past Surgical History  Procedure Laterality Date  . Cardiac catheterization    . Neck dissection Right 2011    s/p resection of throat cancer with multiple nodes removed    There were no vitals filed for this visit.  Visit Diagnosis:  Hemiplegia affecting left nondominant side  Impaired functional mobility and activity tolerance  Impaired sensation  Impaired cognition      Subjective Assessment - 02/28/15 1624    Subjective  (p) I used my left hand to pick up a chip!   Patient is accompained by: (p) Family member   Pertinent History (p) see epic snapshot   Patient Stated Goals (p) Get back to doing some of the things I used to do.   Currently in Pain? (p) Yes   Pain Score (p) 4    Pain Location (p) Wrist   Pain Orientation (p) Left   Pain Descriptors / Indicators (p) Aching   Pain Type (p) Chronic pain   Pain Onset (p)  More than a month ago   Pain Frequency (p) Intermittent   Aggravating Factors  (p) I slept on the wrist wrong last night   Pain Relieving Factors (p) wrist brace, rest, gentle ROM   Multiple Pain Sites (p) Yes   Pain Score (p) 4   Pain Location (p) Ankle   Pain Orientation (p) Left  see PT note for details                      OT Treatments/Exercises (OP) - 02/28/15 0001    ADLs   Functional Mobility Pt able to transfer with distant supervision.  Pt also reports he did his own shower today with no help and also was able to do his own toilet hygiene   Neurological Re-education Exercises   Other Exercises 1 Neuro re ed to address low to mid bilateral reach - pt is now able to hold light object and complete bilateral reach to 65* of shoulder flexion.  Pt also able to intiate both increased grasp and release in low reach with mod facilitation.                    OT Short Term Goals - 02/28/15  [place] #1   Title Pt and wife will be mod I with HEP - 01/18/2015.  Goal dates adjusted as pt will not return to clinic prior to 12/21/14   Status Achieved   OT SHORT TERM GOAL #2   Title Pt will require min a for LB dressing   Status Achieved   OT SHORT TERM GOAL #3   Title Pt will demonstrate ability to use LUE as gross assist during basic self care 50% of the time.   Status Partially Met   OT SHORT TERM GOAL #4   Title Pt will need no more than min vc's to compensate for L neglect during functional mobility tasks   Status Achieved   OT SHORT TERM GOAL #5   Title Pt will demonstrate ability to attend to familiar functional task for 15 minute with no more than one vc.   Status Achieved   OT SHORT TERM GOAL #6   Title Pt will require no more than min assist for toilet transfers   Status Achieved   OT SHORT TERM GOAL #7   Title Pt and wife will be mod I with upgraded HEP prn - 03/14/2015   Status On-going   OT SHORT TERM GOAL #8   Title Pt will be  able to use LUE as gross assist 75% of the time for basic ADL tasks with min cues   Status On-going   OT SHORT TERM GOAL  #9   TITLE Pt will require supervision for tub bench transfers    Status On-going   OT SHORT TERM GOAL  #10   TITLE Pt will require min a for simple snack prep at ambulatory level   Status On-going           OT Long Term Goals - 02/28/15 1727    OT LONG TERM GOAL #1   Title Pt and wife will be mod I with upgraded HEP - 02/15/2015. Goal date adjusted as pt will not return to clinic prior to 12/21/2014   Status Achieved   OT LONG TERM GOAL #2   Title Pt will demonstrate ability to use LUE as gross assist 100% of the time in basic self care tasks   Status Not Met   OT LONG TERM GOAL #3   Title Pt will demonstrate ability to hold cylindrical object in L hand.    Status Achieved   OT LONG TERM GOAL #4   Title Pt will verbalize understanding of AE for buttoning, zipping and cutting   Status Achieved   OT LONG TERM GOAL #5   Title Pt will be supervision for toilet transfers   Status Achieved   OT LONG TERM GOAL #6   Title Pt will be min a for shower transfers   Status Achieved   OT LONG TERM GOAL #7   Title Pt will  be supervision for simple hot meal prep   Status Deferred   OT LONG TERM GOAL #8   Title Pt will demonstrate the ability to use LUE as gross a for basic ADL's 100% of the time - 04/11/2015   Status On-going   OT LONG TERM GOAL  #9   Baseline Pt will be mod I for toilet transfers   Status On-going   OT LONG TERM GOAL  #10   TITLE Pt will be supervision for simple hot meal prep   Status On-going   OT LONG TERM GOAL  #11   TITLE  Pt will demonstrate ability for blilateral mid reach for functional tasks   Status On-going               Plan - 02/28/15 1727    Clinical Impression Statement Pt continues to make functional gains in self care and in RUE functional use.  Pt very motivated and consistent with HEP.   Pt will benefit from skilled  therapeutic intervention in order to improve on the following deficits (Retired) Decreased balance;Decreased coordination;Decreased cognition;Decreased knowledge of use of DME;Decreased mobility;Decreased range of motion;Decreased safety awareness;Difficulty walking;Decreased strength;Increased edema;Increased muscle spasms;Impaired UE functional use;Impaired tone;Impaired sensation;Impaired vision/preception;Pain   Rehab Potential Good   Clinical Impairments Affecting Rehab Potential Pt will benefit from skilled OT to address deficits outlined in eval to maximize independence.   OT Frequency 2x / week   OT Duration 8 weeks   OT Treatment/Interventions Self-care/ADL training;Cryotherapy;Moist Heat;Electrical Stimulation;Fluidtherapy;DME and/or AE instruction;Energy conservation;Neuromuscular education;Therapeutic exercise;Functional Mobility Training;Manual Therapy;Passive range of motion;Splinting;Therapeutic activities;Balance training;Patient/family education;Cognitive remediation/compensation   Plan neuro re ed to LUE, functional mobility   OT Home Exercise Plan issued inital HEP-12/27/14, cane shoulder flexion 01/03/15   Consulted and Agree with Plan of Care Patient;Family member/caregiver   Family Member Consulted wife        Problem List Patient Active Problem List   Diagnosis Date Noted  . Spastic hemiparesis affecting nondominant side 11/08/2014  . Hypothyroidism 10/27/2014  . Left-sided neglect 09/23/2014  . Acute ischemic right MCA stroke 09/16/2014  . Left hemiparesis   . CVA (cerebral vascular accident)   . Right carotid artery occlusion   . Acute right MCA stroke   . Essential hypertension 09/12/2014  . CVA (cerebral infarction) 09/12/2014  . Type 2 diabetes mellitus with peripheral neuropathy 09/12/2014    Quay Burow, OTR/L 02/28/2015, 5:29 PM  [facility] [address] [place], [place],  [address] Phone: [phone]   Fax:  [phone]

## 2015-02-28 NOTE — Therapy (Signed)
[facility] [address] [place], [place], [address] Phone: [phone]   Fax:  [phone]  Physical Therapy Treatment  Patient Details  Name: Brian Hall MRN: [id number] Date of Birth: Mar 04, 1956 Referring Provider:  Claretta Fraise, MD  Encounter Date: 02/28/2015      PT End of Session - 02/28/15 1646    Visit Number 19   Number of Visits 33   Date for PT Re-Evaluation 04/08/15   PT Start Time 6378   PT Stop Time 1615   PT Time Calculation (min) 44 min   Equipment Utilized During Treatment Gait belt   Activity Tolerance Patient tolerated treatment well   Behavior During Therapy [facility] for tasks assessed/performed      Past Medical History  Diagnosis Date  . Hypertension   . Diabetes mellitus   . Obesity   . CHF (congestive heart failure)   . SOB (shortness of breath)   . Hyperkalemia   . Renal insufficiency   . Cardiogenic shock   . Cardiac LV ejection fraction 10-20%   . Hyperlipemia   . Hypothyroidism   . Throat cancer 2011    s/p neck dissection, radiation, chemo  . Stroke   . Blurred vision   . Joint pain   . Joint swelling     Past Surgical History  Procedure Laterality Date  . Cardiac catheterization    . Neck dissection Right 2011    s/p resection of throat cancer with multiple nodes removed    There were no vitals filed for this visit.  Visit Diagnosis:  Abnormality of gait  Left spastic hemiplegia  Coordination abnormal      Subjective Assessment - 02/28/15 1542    Subjective Pt reports having sustained a "graceful fall" yesterday, as he slid into the floor when scooting to L when seated EOB. Pt was uninjured and able to transfer from floor to bed with assist of wife.   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  He has a motorcycle and would  like to ride it again.    Currently in Pain? Yes   Pain Score 5    Pain Location Ankle   Pain Orientation Left;Anterior   Pain Descriptors / Indicators Burning   Pain Type Chronic pain   Pain Onset More than a month ago   Aggravating Factors  wearing brace   Pain Relieving Factors pain medication, elevating LLE      Treatment    Therapeutic Activities: See below for details on functional ambulation, transfers. See Patient Education section for details on education provided  Neuro Re-ed: - Pt performed the following in tall kneeling with BUE support at large physio ball with CGA to Min A, manual stabilization of LUE on ball due to tone: forward/retro walking and lateral stepping 6 trials x4 steps per direction. Pt tolerated tall kneeling position x7.5 minutes consecutively without requesting rest break. Activity focused on increasing proximal stability, selective control of L knee flexion with L hip extension, and addressing L hip retraction during standing/gait. Verbal/tactile cueing focused on full L hip extension and avoiding L hip adduction, lateral weight shift to L side - Blocked practice of sit <> stand from EOM without UE use with RUE on 2" step to increase LLE weightbearing; multimodal cueing focused on erect trunk flexion, full anterior weight shift.  Manual approximation provided at L knee for proprioceptive input, increased weightbearing.                   [facility] Adult PT Treatment/Exercise - 02/28/15 0001    Transfers   Transfers Sit to Stand   Sit to Stand 5: Supervision   Sit to Stand Details (indicate cue type and reason) SBQC   Stand to Sit 5: Supervision   Stand to Sit Details SBQC   Ambulation/Gait   Ambulation/Gait Yes   Ambulation/Gait Assistance 5: Supervision   Ambulation Distance (Feet) 115 Feet   Assistive device Small based quad cane   Gait Pattern Step-to pattern;Decreased stance time - left;Decreased hip/knee flexion - left;Decreased  dorsiflexion - left;Decreased weight shift to left;Decreased trunk rotation;Trunk rotated posteriorly on left   Ambulation Surface Level;Indoor                PT Education - 02/28/15 1844    Education provided Yes   Education Details Plan to pursue supination control strap for L AFO to improve postioning, mitigate ankle pain. Also discussed plan for this PT to speak with MD about botox.   Person(s) Educated Patient;Spouse   Methods Explanation   Comprehension Verbalized understanding          PT Short Term Goals - 02/21/15 1333    PT SHORT TERM GOAL #1   Title Pt will perform HEP with supervision of family, for improved strength and balance. (Target 01/10/15)   Time 4   Period Weeks   Status Achieved   PT SHORT TERM GOAL #2   Title Pt will improve Timed UP and Go score to less than or equal to 35 seconds for decreased fall risk.   Baseline eval-46.78 sec   Time 4   Period Weeks   Status Not Met  46.81 with quad cane and close supervision and occasional CGA   PT SHORT TERM GOAL #3   Title Berg Balance score to be assessed, with pt scoring at least 5 additional points to decrease fall risk.   Time 4   Period Weeks   Status Achieved  Scored 33 today   PT SHORT TERM GOAL #4   Title Pt will perform at least 6 of 10 reps of sit<>stand transfers no UE support, with equal weightbearing through bilateral lower extremities, independently.   Time 4   Period Weeks   Status Achieved   PT SHORT TERM GOAL #5   Title Pt will improve TUG time from 29.69 sec to 25 seconds for decreased fall risk. Target date: 03/08/15.   Time 4   Period Weeks   Status New   PT SHORT TERM GOAL #6   Title Pt will ambulate x200' over level surfaces with [facility] and supervision with no overt LOB. Target date: 03/08/15.   Time 4   Period Weeks   Status New           PT Long Term Goals - 02/21/15 1333    PT LONG TERM GOAL #1   Title Pt will verbalize understanding of fall prevention within home  environment. Target 02/10/15   Time 8   Period Weeks   Status Achieved   PT LONG TERM GOAL #2   Title Pt will improve TUG to less than or equal to 25 seconds for decreased fall risk.   Time 8   Status Not Met  Unmet goal will continue as short-term goal   PT LONG TERM GOAL #3   Title Pt  will improve gait velocity to at least 1 ft/sec for improved gait efficiency and safety.   Time 8   Period Weeks   Status Achieved   PT LONG TERM GOAL #4   Title Pt will ambulate at least 50-100 ft using hemiwalker, household distances, with supervision.   Time 8   Period Weeks   Status Achieved   PT LONG TERM GOAL #5   Title Pt will ambulate 150' over outdoor, unlevel surfaces with LRAD and supervision to indicate increased stability/independence with community mobility. Target date: 04/05/15.   Time 8   Period Weeks   Status New   PT LONG TERM GOAL #6   Title Pt will negotiate 4 stairs with single rail and supervision to increase pt independence with community mobility. Target date: 04/05/15.   Time 8   Period Weeks   Status New   PT LONG TERM GOAL #7   Title Pt will increase gait speed from 1.02 ft/sec to 1.62 ft/sec to indicate significant improvement in efficiency of ambulation. Target date: 04/05/15.   Time 8   Period Weeks   Status New               Plan - 02/28/15 1846    Clinical Impression Statement Session focused on increasing proximal stability/control and promoting LLE weightbearing during transfers, standing. Plan to pursue supination control strap for L AFO for improved positioning and to control pain. Also plan to follow up with referring MD about botox for tone management. Continue per [email or web address].l   Pt will benefit from skilled therapeutic intervention in order to improve on the following deficits Abnormal gait;Decreased activity tolerance;Decreased balance;Decreased mobility;Decreased range of motion;Decreased safety awareness;Difficulty walking;Decreased strength;Impaired  tone;Decreased knowledge of use of DME;Decreased endurance;Decreased coordination;Impaired flexibility;Impaired perceived functional ability;Postural dysfunction;Impaired sensation;Impaired UE functional use;Impaired vision/preception;Decreased cognition;Pain   Rehab Potential Good   PT Frequency 2x / week   PT Duration 8 weeks   PT Treatment/Interventions ADLs/Self Care Home Management;Therapeutic activities;Functional mobility training;Stair training;Gait training;Therapeutic exercise;Balance training;Neuromuscular re-education;Patient/family education;DME Instruction;Cognitive remediation;Orthotic Fit/Training;Manual techniques;Passive range of motion;Visual/perceptual remediation/compensation   PT Next Visit Plan NMR for proximal stability (tall kneeling). LLE NMR with focus on return of L ankle dorsiflexion. Decrease synergistic movement in LLE.   Recommended Other Services Follow up on telephone encounter sent to primary MD about botox as possible treatment for tone affecting L foot/ankle   Consulted and Agree with Plan of Care Patient;Family member/caregiver   Family Member Consulted wife, [name]        Problem List Patient Active Problem List   Diagnosis Date Noted  . Spastic hemiparesis affecting nondominant side 11/08/2014  . Hypothyroidism 10/27/2014  . Left-sided neglect 09/23/2014  . Acute ischemic right MCA stroke 09/16/2014  . Left hemiparesis   . CVA (cerebral vascular accident)   . Right carotid artery occlusion   . Acute right MCA stroke   . Essential hypertension 09/12/2014  . CVA (cerebral infarction) 09/12/2014  . Type 2 diabetes mellitus with peripheral neuropathy 09/12/2014    Billie Ruddy, PT, DPT [facility] [address] [place], [place], [address] Phone: [phone]   Fax:  [phone] 02/28/2015, 7:00 PM

## 2015-03-02 ENCOUNTER — Encounter: Payer: Self-pay | Admitting: Occupational Therapy

## 2015-03-02 ENCOUNTER — Ambulatory Visit: Payer: 59 | Admitting: Occupational Therapy

## 2015-03-02 ENCOUNTER — Ambulatory Visit: Payer: 59 | Admitting: Physical Therapy

## 2015-03-02 DIAGNOSIS — R269 Unspecified abnormalities of gait and mobility: Secondary | ICD-10-CM

## 2015-03-02 DIAGNOSIS — R201 Hypoesthesia of skin: Secondary | ICD-10-CM

## 2015-03-02 DIAGNOSIS — R4189 Other symptoms and signs involving cognitive functions and awareness: Secondary | ICD-10-CM

## 2015-03-02 DIAGNOSIS — G8194 Hemiplegia, unspecified affecting left nondominant side: Secondary | ICD-10-CM

## 2015-03-02 DIAGNOSIS — R278 Other lack of coordination: Secondary | ICD-10-CM

## 2015-03-02 DIAGNOSIS — Z7409 Other reduced mobility: Secondary | ICD-10-CM

## 2015-03-02 DIAGNOSIS — G8114 Spastic hemiplegia affecting left nondominant side: Secondary | ICD-10-CM

## 2015-03-02 NOTE — Therapy (Signed)
[facility] [address] [place], [place], [address] Phone: [phone]   Fax:  [phone]  Occupational Therapy Treatment  Patient Details  Name: Brian Hall MRN: [id number] Date of Birth: [birth year] Referring Provider:  Claretta Fraise, MD  Encounter Date: 03/02/2015      OT End of Session - 03/02/15 1628    Visit Number 21   Number of Visits 32   Date for OT Re-Evaluation 04/11/15   Authorization Type [employer]   OT Start Time 3704   OT Stop Time 1400   OT Time Calculation (min) 43 min   Activity Tolerance Patient tolerated treatment well      Past Medical History  Diagnosis Date  . Hypertension   . Diabetes mellitus   . Obesity   . CHF (congestive heart failure)   . SOB (shortness of breath)   . Hyperkalemia   . Renal insufficiency   . Cardiogenic shock   . Cardiac LV ejection fraction 10-20%   . Hyperlipemia   . Hypothyroidism   . Throat cancer 2011    s/p neck dissection, radiation, chemo  . Stroke   . Blurred vision   . Joint pain   . Joint swelling     Past Surgical History  Procedure Laterality Date  . Cardiac catheterization    . Neck dissection Right 2011    s/p resection of throat cancer with multiple nodes removed    There were no vitals filed for this visit.  Visit Diagnosis:  Hemiplegia affecting left nondominant side  Impaired functional mobility and activity tolerance  Impaired sensation  Impaired cognition      Subjective Assessment - 03/02/15 1325    Subjective  I ate my toast with my left hand this morning   Patient is accompained by: Family member   Pertinent History see epic snapshot   Patient Stated Goals Get back to doing some of the things I used to do.   Currently in Pain? Yes   Pain Score 5    Pain Location Ankle   Pain Orientation Left;Anterior   Pain Descriptors / Indicators Burning   Pain Type Chronic pain   Pain Onset More than a month ago   Pain  Frequency Intermittent   Aggravating Factors  wearing brace   Pain Relieving Factors pain meds, taking off brace, elevating LLE   Multiple Pain Sites No                      OT Treatments/Exercises (OP) - 03/02/15 0001    Neurological Re-education Exercises   Other Exercises 1 Neuro re ed to address functional grasp and release - pt requires max facilitation for opening hand and min facilitation for gross grasp.  Pt improving in ability to grade force when using LUE. ALso addressed bilateral reach  in closed chain activity - pt able to reach to approximately 50* of shoulder flexion with min facilitation and min compensations.  Tone continues to fluctuate however pt does respond to tone reduction techniques. Pt uses techniques at home when attempting to use his hand.                  OT Short Term Goals - 03/02/15 1626    OT SHORT TERM GOAL #1   Title Pt and wife will be mod I with HEP - 01/18/2015.  Goal dates adjusted as pt will not return to clinic prior to 12/21/14   Status Achieved  OT SHORT TERM GOAL #2   Title Pt will require min a for LB dressing   Status Achieved   OT SHORT TERM GOAL #3   Title Pt will demonstrate ability to use LUE as gross assist during basic self care 50% of the time.   Status Partially Met   OT SHORT TERM GOAL #4   Title Pt will need no more than min vc's to compensate for L neglect during functional mobility tasks   Status Achieved   OT SHORT TERM GOAL #5   Title Pt will demonstrate ability to attend to familiar functional task for 15 minute with no more than one vc.   Status Achieved   OT SHORT TERM GOAL #6   Title Pt will require no more than min assist for toilet transfers   Status Achieved   OT SHORT TERM GOAL #7   Title Pt and wife will be mod I with upgraded HEP prn - 03/14/2015   Status On-going   OT SHORT TERM GOAL #8   Title Pt will be able to use LUE as gross assist 75% of the time for basic ADL tasks with min cues    Status On-going   OT SHORT TERM GOAL  #9   TITLE Pt will require supervision for tub bench transfers    Status On-going   OT SHORT TERM GOAL  #10   TITLE Pt will require min a for simple snack prep at ambulatory level   Status On-going           OT Long Term Goals - 03/02/15 1627    OT LONG TERM GOAL #1   Title Pt and wife will be mod I with upgraded HEP - 02/15/2015. Goal date adjusted as pt will not return to clinic prior to 12/21/2014   Status Achieved   OT LONG TERM GOAL #2   Title Pt will demonstrate ability to use LUE as gross assist 100% of the time in basic self care tasks   Status Not Met   OT LONG TERM GOAL #3   Title Pt will demonstrate ability to hold cylindrical object in L hand.    Status Achieved   OT LONG TERM GOAL #4   Title Pt will verbalize understanding of AE for buttoning, zipping and cutting   Status Achieved   OT LONG TERM GOAL #5   Title Pt will be supervision for toilet transfers   Status Achieved   OT LONG TERM GOAL #6   Title Pt will be min a for shower transfers   Status Achieved   OT LONG TERM GOAL #7   Title Pt will  be supervision for simple hot meal prep   Status Deferred   OT LONG TERM GOAL #8   Title Pt will demonstrate the ability to use LUE as gross a for basic ADL's 100% of the time - 04/11/2015   Status On-going   OT LONG TERM GOAL  #9   Baseline Pt will be mod I for toilet transfers   Status On-going   OT LONG TERM GOAL  #10   TITLE Pt will be supervision for simple hot meal prep   Status On-going   OT LONG TERM GOAL  #11   TITLE Pt will demonstrate ability for blilateral mid reach for functional tasks   Status On-going               Plan - 03/02/15 1627    Clinical Impression Statement Pt continues to make  progress and is continuing to attempt to use LUE more and more at home. Pt is now showering mod I.   Pt will benefit from skilled therapeutic intervention in order to improve on the following deficits (Retired) Decreased  balance;Decreased coordination;Decreased cognition;Decreased knowledge of use of DME;Decreased mobility;Decreased range of motion;Decreased safety awareness;Difficulty walking;Decreased strength;Increased edema;Increased muscle spasms;Impaired UE functional use;Impaired tone;Impaired sensation;Impaired vision/preception;Pain   Rehab Potential Good   Clinical Impairments Affecting Rehab Potential Pt will benefit from skilled OT to address deficits outlined in eval to maximize independence.   OT Frequency 2x / week   OT Duration 8 weeks   OT Treatment/Interventions Self-care/ADL training;Cryotherapy;Moist Heat;Electrical Stimulation;Fluidtherapy;DME and/or AE instruction;Energy conservation;Neuromuscular education;Therapeutic exercise;Functional Mobility Training;Manual Therapy;Passive range of motion;Splinting;Therapeutic activities;Balance training;Patient/family education;Cognitive remediation/compensation   Plan cooking task at ambulatory level   OT Home Exercise Plan issued inital HEP-12/27/14, cane shoulder flexion 01/03/15   Consulted and Agree with Plan of Care Patient;Family member/caregiver   Family Member Consulted wife        Problem List Patient Active Problem List   Diagnosis Date Noted  . Spastic hemiparesis affecting nondominant side 11/08/2014  . Hypothyroidism 10/27/2014  . Left-sided neglect 09/23/2014  . Acute ischemic right MCA stroke 09/16/2014  . Left hemiparesis   . CVA (cerebral vascular accident)   . Right carotid artery occlusion   . Acute right MCA stroke   . Essential hypertension 09/12/2014  . CVA (cerebral infarction) 09/12/2014  . Type 2 diabetes mellitus with peripheral neuropathy 09/12/2014    Quay Burow, OTR/L 03/02/2015, 4:29 PM  [facility] [address] [place], [place], [address] Phone: [phone]   Fax:  [phone]

## 2015-03-02 NOTE — Therapy (Signed)
[facility] [address] [place], [place], [address] Phone: [phone]   Fax:  [phone]  Physical Therapy Treatment  Patient Details  Name: Brian Hall MRN: [id number] Date of Birth: Aug 06, 1956 Referring Provider:  Claretta Fraise, MD  Encounter Date: 03/02/2015    Past Medical History  Diagnosis Date  . Hypertension   . Diabetes mellitus   . Obesity   . CHF (congestive heart failure)   . SOB (shortness of breath)   . Hyperkalemia   . Renal insufficiency   . Cardiogenic shock   . Cardiac LV ejection fraction 10-20%   . Hyperlipemia   . Hypothyroidism   . Throat cancer 2011    s/p neck dissection, radiation, chemo  . Stroke   . Blurred vision   . Joint pain   . Joint swelling     Past Surgical History  Procedure Laterality Date  . Cardiac catheterization    . Neck dissection Right 2011    s/p resection of throat cancer with multiple nodes removed    There were no vitals filed for this visit.  Visit Diagnosis:  No diagnosis found.      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  He has a motorcycle and would like to ride it again.    Currently in Pain? Yes   Pain Score 3    Pain Location Ankle   Pain Orientation Left;Anterior   Pain Descriptors / Indicators Burning   Pain Type Chronic pain   Pain Onset More than a month ago   Pain Frequency Intermittent   Aggravating Factors  wearing brace   Pain Relieving Factors taking off brace, elevating LLE, pain meds   Multiple Pain Sites No          Treatment    Neuro Re-ed: -  L half tall kneeling (standing with R foot on floor, L knee on mat table): with RUE reaching (across midline then anterior) for L gluteus maximus/medius activation, L pelvic protraction. -  Modified quadruped (on forearms): pt performed forward/retro forearm walking for proximal stability, scapular stabilization, to decrease LLE synergistic movement/L pelvic retraction. Required +2A to maximize cueing to promote pt activation, quality of movement; one PT provided manual stabilization at LUE and promoted scapular depression/protraction; other PT provided lateral weight shifting. - Gait [phone]' over level/indoor surfaces; initial 45' with [facility] and supervision; remainder of trial without AD with +2A to maximize cueing, which focused on upright posture, increased forward weight shift over LLE; L hip extensor/ABD activation during LLE stance.                        PT Short Term Goals - 02/21/15 1333    PT SHORT TERM GOAL #1   Title Pt will perform HEP with supervision of family, for improved strength and balance. (Target 01/10/15)   Time 4   Period Weeks   Status Achieved   PT SHORT TERM GOAL #2   Title Pt will improve Timed UP and Go score to less than or equal to 35 seconds for decreased fall risk.   Baseline eval-46.78 sec   Time 4   Period Weeks   Status Not Met  46.81 with quad cane and close supervision and occasional CGA  PT SHORT TERM GOAL #3   Title Berg Balance score to be assessed, with pt scoring at least 5 additional points to decrease fall risk.   Time 4   Period Weeks   Status Achieved  Scored 33 today   PT SHORT TERM GOAL #4   Title Pt will perform at least 6 of 10 reps of sit<>stand transfers no UE support, with equal weightbearing through bilateral lower extremities, independently.   Time 4   Period Weeks   Status Achieved   PT SHORT TERM GOAL #5   Title Pt will improve TUG time from 29.69 sec to 25 seconds for decreased fall risk. Target date: 03/08/15.   Time 4   Period Weeks   Status New   PT SHORT TERM GOAL #6   Title Pt will ambulate x200' over level surfaces with [facility] and supervision with no overt LOB. Target date: 03/08/15.   Time 4    Period Weeks   Status New           PT Long Term Goals - 02/21/15 1333    PT LONG TERM GOAL #1   Title Pt will verbalize understanding of fall prevention within home environment. Target 02/10/15   Time 8   Period Weeks   Status Achieved   PT LONG TERM GOAL #2   Title Pt will improve TUG to less than or equal to 25 seconds for decreased fall risk.   Time 8   Status Not Met  Unmet goal will continue as short-term goal   PT LONG TERM GOAL #3   Title Pt will improve gait velocity to at least 1 ft/sec for improved gait efficiency and safety.   Time 8   Period Weeks   Status Achieved   PT LONG TERM GOAL #4   Title Pt will ambulate at least 50-100 ft using hemiwalker, household distances, with supervision.   Time 8   Period Weeks   Status Achieved   PT LONG TERM GOAL #5   Title Pt will ambulate 150' over outdoor, unlevel surfaces with LRAD and supervision to indicate increased stability/independence with community mobility. Target date: 04/05/15.   Time 8   Period Weeks   Status New   PT LONG TERM GOAL #6   Title Pt will negotiate 4 stairs with single rail and supervision to increase pt independence with community mobility. Target date: 04/05/15.   Time 8   Period Weeks   Status New   PT LONG TERM GOAL #7   Title Pt will increase gait speed from 1.02 ft/sec to 1.62 ft/sec to indicate significant improvement in efficiency of ambulation. Target date: 04/05/15.   Time 8   Period Weeks   Status New               Problem List Patient Active Problem List   Diagnosis Date Noted  . Spastic hemiparesis affecting nondominant side 11/08/2014  . Hypothyroidism 10/27/2014  . Left-sided neglect 09/23/2014  . Acute ischemic right MCA stroke 09/16/2014  . Left hemiparesis   . CVA (cerebral vascular accident)   . Right carotid artery occlusion   . Acute right MCA stroke   . Essential hypertension 09/12/2014  . CVA (cerebral infarction) 09/12/2014  . Type 2 diabetes mellitus  with peripheral neuropathy 09/12/2014   Billie Ruddy, PT, DPT [facility] [address] [place], [place], [address] Phone: [phone]   Fax:  [phone] 03/02/2015, 5:08 PM

## 2015-03-06 ENCOUNTER — Other Ambulatory Visit: Payer: Self-pay | Admitting: Family Medicine

## 2015-03-07 ENCOUNTER — Ambulatory Visit: Payer: 59 | Admitting: Occupational Therapy

## 2015-03-07 ENCOUNTER — Encounter: Payer: Self-pay | Admitting: Occupational Therapy

## 2015-03-07 ENCOUNTER — Ambulatory Visit: Payer: 59 | Attending: Physical Medicine & Rehabilitation | Admitting: Physical Therapy

## 2015-03-07 DIAGNOSIS — Z7409 Other reduced mobility: Secondary | ICD-10-CM | POA: Insufficient documentation

## 2015-03-07 DIAGNOSIS — R269 Unspecified abnormalities of gait and mobility: Secondary | ICD-10-CM | POA: Diagnosis present

## 2015-03-07 DIAGNOSIS — R4189 Other symptoms and signs involving cognitive functions and awareness: Secondary | ICD-10-CM | POA: Diagnosis present

## 2015-03-07 DIAGNOSIS — R278 Other lack of coordination: Secondary | ICD-10-CM | POA: Insufficient documentation

## 2015-03-07 DIAGNOSIS — G8114 Spastic hemiplegia affecting left nondominant side: Secondary | ICD-10-CM

## 2015-03-07 DIAGNOSIS — G8110 Spastic hemiplegia affecting unspecified side: Secondary | ICD-10-CM | POA: Insufficient documentation

## 2015-03-07 DIAGNOSIS — R201 Hypoesthesia of skin: Secondary | ICD-10-CM | POA: Insufficient documentation

## 2015-03-07 DIAGNOSIS — G8194 Hemiplegia, unspecified affecting left nondominant side: Secondary | ICD-10-CM | POA: Diagnosis present

## 2015-03-07 NOTE — Therapy (Signed)
[facility] [address] [place], [place], [address] Phone: [phone]   Fax:  [phone]  Occupational Therapy Treatment  Patient Details  Name: Brian Hall MRN: [id number] Date of Birth: 1956-02-01 Referring Provider:  Claretta Fraise, MD  Encounter Date: 03/07/2015      OT End of Session - 03/07/15 1304    Visit Number 22   Number of Visits 32   Date for OT Re-Evaluation 04/11/15   Authorization Type [employer]   Authorization Time Period pt will not return to clinic until 12/21/14 in order to allow time to clarify insurance.   OT Start Time 1146   OT Stop Time 1230   OT Time Calculation (min) 44 min   Activity Tolerance Patient tolerated treatment well      Past Medical History  Diagnosis Date  . Hypertension   . Diabetes mellitus   . Obesity   . CHF (congestive heart failure)   . SOB (shortness of breath)   . Hyperkalemia   . Renal insufficiency   . Cardiogenic shock   . Cardiac LV ejection fraction 10-20%   . Hyperlipemia   . Hypothyroidism   . Throat cancer 2011    s/p neck dissection, radiation, chemo  . Stroke   . Blurred vision   . Joint pain   . Joint swelling     Past Surgical History  Procedure Laterality Date  . Cardiac catheterization    . Neck dissection Right 2011    s/p resection of throat cancer with multiple nodes removed    There were no vitals filed for this visit.  Visit Diagnosis:  Hemiplegia affecting left nondominant side  Impaired functional mobility and activity tolerance  Impaired sensation  Impaired cognition      Subjective Assessment - 03/07/15 1152    Subjective  I can get in and out of my friend's Lucian[employer] Lei now   Patient is accompained by: Family member   Pertinent History see epic snapshot   Patient Stated Goals Get back to doing some of the things I used to do.   Currently in Pain? Yes   Pain Score 2    Pain Location Ankle   Pain Orientation Left   Pain Descriptors / Indicators Burning   Pain Type Chronic pain   Pain Onset More than a month ago   Pain Frequency Intermittent   Aggravating Factors  wearing brace too  long   Pain Relieving Factors the new strap on my brace, taking brace off, elevating LLE, pain meds   Multiple Pain Sites No                      OT Treatments/Exercises (OP) - 03/07/15 0001    ADLs   Cooking Addressed simple snack prep at ambulatory level (pudding). Pt required mod vc's and contact guard for balance (cues for balance and safety). Pt required  min - mod facilitation to use LUE in activity as gross assist. Pt able to hold objects and to complete low bilateral reach in activities. Pt's wife present. Pt with significant fatigue at end of session.                  OT Short Term Goals - 03/07/15 1147    OT SHORT TERM GOAL #1   Title Pt and wife will be mod I with HEP - 01/18/2015.  Goal dates adjusted as pt will not return to clinic prior to 12/21/14   Status  Achieved   OT SHORT TERM GOAL #2   Title Pt will require min a for LB dressing   Status Achieved   OT SHORT TERM GOAL #3   Title Pt will demonstrate ability to use LUE as gross assist during basic self care 50% of the time.   Status Partially Met   OT SHORT TERM GOAL #4   Title Pt will need no more than min vc's to compensate for L neglect during functional mobility tasks   Status Achieved   OT SHORT TERM GOAL #5   Title Pt will demonstrate ability to attend to familiar functional task for 15 minute with no more than one vc.   Status Achieved   OT SHORT TERM GOAL #6   Title Pt will require no more than min assist for toilet transfers   Status Achieved   OT SHORT TERM GOAL #7   Title Pt and wife will be mod I with upgraded HEP prn - 03/14/2015   Status Achieved   OT SHORT TERM GOAL #8   Title Pt will be able to use LUE as gross assist 75% of the time for basic ADL tasks with min cues   Status Achieved   OT SHORT TERM GOAL   #9   TITLE Pt will require supervision for tub bench transfers    Status Deferred  pt is now stepping over ledge and into shower wtih min with wife to sit on seat   OT SHORT TERM GOAL  #10   TITLE Pt will require min a for simple snack prep at ambulatory level   Status Achieved           OT Long Term Goals - 03/07/15 1302    OT LONG TERM GOAL #1   Title Pt and wife will be mod I with upgraded HEP - 02/15/2015. Goal date adjusted as pt will not return to clinic prior to 12/21/2014   Status Achieved   OT LONG TERM GOAL #2   Title Pt will demonstrate ability to use LUE as gross assist 100% of the time in basic self care tasks   Status Not Met   OT LONG TERM GOAL #3   Title Pt will demonstrate ability to hold cylindrical object in L hand.    Status Achieved   OT LONG TERM GOAL #4   Title Pt will verbalize understanding of AE for buttoning, zipping and cutting   Status Achieved   OT LONG TERM GOAL #5   Title Pt will be supervision for toilet transfers   Status Achieved   OT LONG TERM GOAL #6   Title Pt will be min a for shower transfers   Status Achieved   OT LONG TERM GOAL #7   Title Pt will  be supervision for simple hot meal prep   Status Deferred   OT LONG TERM GOAL #8   Title Pt will demonstrate the ability to use LUE as gross a for basic ADL's 100% of the time - 04/11/2015   Status On-going   OT LONG TERM GOAL  #9   Baseline Pt will be mod I for toilet transfers   Status On-going   OT LONG TERM GOAL  #10   TITLE Pt will be supervision for simple hot meal prep   Status On-going   OT LONG TERM GOAL  #11   TITLE Pt will demonstrate ability for blilateral mid reach for functional tasks   Status On-going  Plan - 03/07/15 1302    Clinical Impression Statement Pt is improving in his ability to incorporate LUE into functional tasks as well as in his functional mobility. Pt continues to need vc's for impulsiviity, safety and awareness.   Pt will benefit  from skilled therapeutic intervention in order to improve on the following deficits (Retired) Decreased balance;Decreased coordination;Decreased cognition;Decreased knowledge of use of DME;Decreased mobility;Decreased range of motion;Decreased safety awareness;Difficulty walking;Decreased strength;Increased edema;Increased muscle spasms;Impaired UE functional use;Impaired tone;Impaired sensation;Impaired vision/preception;Pain   Rehab Potential Good   Clinical Impairments Affecting Rehab Potential Pt will benefit from skilled OT to address deficits outlined in eval to maximize independence.   OT Frequency 2x / week   OT Duration 8 weeks   OT Treatment/Interventions Self-care/ADL training;Cryotherapy;Moist Heat;Electrical Stimulation;Fluidtherapy;DME and/or AE instruction;Energy conservation;Neuromuscular education;Therapeutic exercise;Functional Mobility Training;Manual Therapy;Passive range of motion;Splinting;Therapeutic activities;Balance training;Patient/family education;Cognitive remediation/compensation   Plan neuro re ed to LUE, functional tasks, balance, and safety   OT Home Exercise Plan issued inital HEP-12/27/14, cane shoulder flexion 01/03/15   Consulted and Agree with Plan of Care Patient;Family member/caregiver   Family Member Consulted wife        Problem List Patient Active Problem List   Diagnosis Date Noted  . Spastic hemiparesis affecting nondominant side 11/08/2014  . Hypothyroidism 10/27/2014  . Left-sided neglect 09/23/2014  . Acute ischemic right MCA stroke 09/16/2014  . Left hemiparesis   . CVA (cerebral vascular accident)   . Right carotid artery occlusion   . Acute right MCA stroke   . Essential hypertension 09/12/2014  . CVA (cerebral infarction) 09/12/2014  . Type 2 diabetes mellitus with peripheral neuropathy 09/12/2014    Quay Burow, OTR/L 03/07/2015, 1:05 PM  [facility] [address]  [place], [place], [address] Phone: [phone]   Fax:  [phone]

## 2015-03-07 NOTE — Patient Instructions (Signed)
Quads / HF, Supine   Lie near edge of bed, one leg bent, foot flat on bed. Other leg hanging over edge, relaxed, thigh resting entirely on bed. Bend hanging knee backward keeping thigh in contact with bed. Hold _60__ seconds.  Repeat _3-5 minutes per day.  Copyright  VHI. All rights reserved.

## 2015-03-07 NOTE — Therapy (Signed)
[facility] [address] [place], [place], [address] Phone: [phone]   Fax:  [phone]  Physical Therapy Treatment  Patient Details  Name: Brian Hall MRN: [id number] Date of Birth: 12/02/1955 Referring Provider:  Claretta Fraise, MD  Encounter Date: 03/07/2015      PT End of Session - 03/07/15 1336    Visit Number 21   Number of Visits 33   Date for PT Re-Evaluation 04/08/15   PT Start Time 1105   PT Stop Time 1145   PT Time Calculation (min) 40 min   Equipment Utilized During Treatment Gait belt   Activity Tolerance Patient tolerated treatment well   Behavior During Therapy [facility] for tasks assessed/performed      Past Medical History  Diagnosis Date  . Hypertension   . Diabetes mellitus   . Obesity   . CHF (congestive heart failure)   . SOB (shortness of breath)   . Hyperkalemia   . Renal insufficiency   . Cardiogenic shock   . Cardiac LV ejection fraction 10-20%   . Hyperlipemia   . Hypothyroidism   . Throat cancer 2011    s/p neck dissection, radiation, chemo  . Stroke   . Blurred vision   . Joint pain   . Joint swelling     Past Surgical History  Procedure Laterality Date  . Cardiac catheterization    . Neck dissection Right 2011    s/p resection of throat cancer with multiple nodes removed    There were no vitals filed for this visit.  Visit Diagnosis:  Abnormality of gait  Left spastic hemiplegia  Coordination abnormal      Subjective Assessment - 03/07/15 1324    Subjective Pt expressing feeling, "Tired of being like this," during this session. Pain in L foot/ankle only mildly improved.   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  He has a motorcycle and would like to ride it again.    Currently in Pain? Yes   Pain Score 2    Pain Location  Ankle   Pain Orientation Left   Pain Descriptors / Indicators Burning   Pain Type Chronic pain   Pain Onset More than a month ago   Pain Frequency Intermittent   Aggravating Factors  wearing brace too long   Pain Relieving Factors supination control strap, removal of brace   Multiple Pain Sites No         Treatment   Gait Training: - Focused on negotiation of community obstacles (standard curb step, ramp) with SBQC; see below for further detail on assist/cueing. - Lateral negotiation of 4 stairs with L rail and step-to pattern, ascending forwards and descending backwards with verbal cueing for foot placement on steps. See below for further detail.  Neuro Re-ed: - Tall kneeling with BUE support at physioball (maual stabilization/positioning of LUE for increased weightbearing, prevention of flexion synergy) for increased proximal stability of LLE, decreased LLE extension tone/movement pattern. Pt performed forward stepping 2 x2 steps, then lateral stepping 2 x2 steps per direction. Cueing focused on L hip extension, upright posture.  Therapeutic Exercises: -  Functional ambulation x230' over level, indoor surfaces with [facility] and supervision.  - Supine, hook lying self-stretch of L iliopsoas, L rectus femoris x60-second holds each. Added to HEP.  [place] Adult PT Treatment/Exercise - 03/07/15 0001    Transfers   Transfers Stand to Sit;Sit to Stand;Stand Pivot Transfers   Sit to Stand 6: Modified independent (Device/Increase time)   Sit to Stand Details (indicate cue type and reason) SBQC   Stand to Sit 6: Modified independent (Device/Increase time)   Stand to Sit Details SBQC   Ambulation/Gait   Ambulation/Gait Yes   Ambulation/Gait Assistance 5: Supervision   Ambulation Distance (Feet) 230 Feet   Assistive device Small based quad cane   Gait Pattern Step-to pattern;Decreased step length - right;Decreased stance time - left;Decreased hip/knee flexion -  left;Decreased weight shift to left;Trunk rotated posteriorly on left   Ambulation Surface Level;Indoor   Stairs Yes   Stairs Assistance 3: Mod assist   Stairs Assistance Details (indicate cue type and reason) foot placement   Stair Management Technique One rail Left;Step to pattern;Sideways   Number of Stairs 4   Height of Stairs 6   Ramp 4: Min assist   Curb 3: Mod assist;4: Min assist   Curb Details (indicate cue type and reason) Cueing for sequencing of SBQC as well as LE's during ascent and descent                  PT Short Term Goals - 03/07/15 1339    PT SHORT TERM GOAL #1   Title Pt will perform HEP with supervision of family, for improved strength and balance. (Target 01/10/15)   Time 4   Period Weeks   Status Achieved   PT SHORT TERM GOAL #2   Title Pt will improve Timed UP and Go score to less than or equal to 35 seconds for decreased fall risk.   Baseline eval-46.78 sec   Time 4   Period Weeks   Status Not Met  46.81 with quad cane and close supervision and occasional CGA   PT SHORT TERM GOAL #3   Title Berg Balance score to be assessed, with pt scoring at least 5 additional points to decrease fall risk.   Time 4   Period Weeks   Status Achieved  Scored 33 today   PT SHORT TERM GOAL #4   Title Pt will perform at least 6 of 10 reps of sit<>stand transfers no UE support, with equal weightbearing through bilateral lower extremities, independently.   Time 4   Period Weeks   Status Achieved   PT SHORT TERM GOAL #5   Title Pt will improve TUG time from 29.69 sec to 25 seconds for decreased fall risk. Target date: 03/08/15.   Time 4   Period Weeks   Status On-going   PT SHORT TERM GOAL #6   Title Pt will ambulate x200' over level surfaces with SBQC and supervision with no overt LOB. Target date: 03/08/15.   Baseline Achieved 03/07/15.   Time 4   Period Weeks   Status Achieved           PT Long Term Goals - 03/07/15 1340    PT LONG TERM GOAL #1   Title  Pt will verbalize understanding of fall prevention within home environment. Target 02/10/15   Time 8   Period Weeks   Status Achieved   PT LONG TERM GOAL #2   Title Pt will improve TUG to less than or equal to 25 seconds for decreased fall risk.   Time 8   Status Not Met  Unmet goal will continue as short-term goal   PT LONG TERM GOAL #3  Title Pt will improve gait velocity to at least 1 ft/sec for improved gait efficiency and safety.   Time 8   Period Weeks   Status Achieved   PT LONG TERM GOAL #4   Title Pt will ambulate at least 50-100 ft using hemiwalker, household distances, with supervision.   Time 8   Period Weeks   Status Achieved   PT LONG TERM GOAL #5   Title Pt will ambulate 150' over outdoor, unlevel surfaces with LRAD and supervision to indicate increased stability/independence with community mobility. Target date: 04/05/15.   Time 8   Period Weeks   Status On-going   PT LONG TERM GOAL #6   Title Pt will negotiate 4 stairs with single rail and supervision to increase pt independence with community mobility. Target date: 04/05/15.   Time 8   Period Weeks   Status On-going   PT LONG TERM GOAL #7   Title Pt will increase gait speed from 1.02 ft/sec to 1.62 ft/sec to indicate significant improvement in efficiency of ambulation. Target date: 04/05/15.   Time 8   Period Weeks   Status On-going               Plan - 03/07/15 1337    Clinical Impression Statement Achieved STG addressing ambulation x200' with [facility] and supervision. Pt continues to require mod A for stair negotiation, min to mod A for curb step negotiation. Recommending that wife does not assist pt with stair negotiation or curb step negotiation at this time. Continue per POC.   Rehab Potential Good   PT Frequency 2x / week   PT Treatment/Interventions ADLs/Self Care Home Management;Therapeutic activities;Functional mobility training;Stair training;Gait training;Therapeutic exercise;Balance  training;Neuromuscular re-education;Patient/family education;DME Instruction;Cognitive remediation;Orthotic Fit/Training;Manual techniques;Passive range of motion;Visual/perceptual remediation/compensation   PT Next Visit Plan Check STG for TUG. Continue to address community mobility (curb step negotiation, stairs, unlevel asphalt); NMR for proximal stability (tall kneeling). Decrease synergistic movement in LLE.   Consulted and Agree with Plan of Care Patient;Family member/caregiver   Family Member Consulted wife, [name]        Problem List Patient Active Problem List   Diagnosis Date Noted  . Spastic hemiparesis affecting nondominant side 11/08/2014  . Hypothyroidism 10/27/2014  . Left-sided neglect 09/23/2014  . Acute ischemic right MCA stroke 09/16/2014  . Left hemiparesis   . CVA (cerebral vascular accident)   . Right carotid artery occlusion   . Acute right MCA stroke   . Essential hypertension 09/12/2014  . CVA (cerebral infarction) 09/12/2014  . Type 2 diabetes mellitus with peripheral neuropathy 09/12/2014   Billie Ruddy, PT, DPT [facility] [address] [place], [place], [address] Phone: [phone]   Fax:  [phone] 03/07/2015, 1:41 PM

## 2015-03-09 ENCOUNTER — Ambulatory Visit: Payer: 59 | Admitting: Physical Therapy

## 2015-03-09 ENCOUNTER — Encounter: Payer: Self-pay | Admitting: Occupational Therapy

## 2015-03-09 ENCOUNTER — Ambulatory Visit: Payer: 59 | Admitting: Occupational Therapy

## 2015-03-09 DIAGNOSIS — R201 Hypoesthesia of skin: Secondary | ICD-10-CM

## 2015-03-09 DIAGNOSIS — R269 Unspecified abnormalities of gait and mobility: Secondary | ICD-10-CM | POA: Diagnosis not present

## 2015-03-09 DIAGNOSIS — Z7409 Other reduced mobility: Secondary | ICD-10-CM

## 2015-03-09 DIAGNOSIS — G8194 Hemiplegia, unspecified affecting left nondominant side: Secondary | ICD-10-CM

## 2015-03-09 DIAGNOSIS — R4189 Other symptoms and signs involving cognitive functions and awareness: Secondary | ICD-10-CM

## 2015-03-09 NOTE — Therapy (Signed)
[facility] [address] [place], [place], [address] Phone: [phone]   Fax:  [phone]  Physical Therapy Treatment  Patient Details  Name: Brian Hall MRN: [id number] Date of Birth: Jun 09, 1956 Referring Provider:  Claretta Fraise, MD  Encounter Date: 03/09/2015      PT End of Session - 03/09/15 1609    Visit Number 22   Number of Visits 33   Date for PT Re-Evaluation 04/08/15   PT Start Time 7782   PT Stop Time 1449   PT Time Calculation (min) 46 min   Activity Tolerance Patient tolerated treatment well   Behavior During Therapy [facility] for tasks assessed/performed      Past Medical History  Diagnosis Date  . Hypertension   . Diabetes mellitus   . Obesity   . CHF (congestive heart failure)   . SOB (shortness of breath)   . Hyperkalemia   . Renal insufficiency   . Cardiogenic shock   . Cardiac LV ejection fraction 10-20%   . Hyperlipemia   . Hypothyroidism   . Throat cancer 2011    s/p neck dissection, radiation, chemo  . Stroke   . Blurred vision   . Joint pain   . Joint swelling     Past Surgical History  Procedure Laterality Date  . Cardiac catheterization    . Neck dissection Right 2011    s/p resection of throat cancer with multiple nodes removed    There were no vitals filed for this visit.  Visit Diagnosis:  Abnormality of gait  Hemiplegia affecting left nondominant side      Subjective Assessment - 03/09/15 1603    Subjective Pt reporting continued pain in L foot/ankle. Pt denies falls. Wife has been stretching patient's hip flexors daily snce last session.   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  He has a motorcycle and would like to ride it again.    Currently in Pain? Yes   Pain Score 3    Pain Location Ankle   Pain Descriptors / Indicators Burning   Pain Type Chronic pain   Pain  Onset More than a month ago   Pain Frequency Intermittent   Aggravating Factors  walking, wearing brace too long   Pain Relieving Factors taking brace off   Multiple Pain Sites No         Neuro Re-ed: - Semi reclined: performed manually-assisted L ankle dorsiflexion with tactile cueing on L anterior tib, great toe extensors; able to elicit pure dorsiflexion ~25% of the time without onset of tone causing forefoot supination, toe flexion. Noted L peroneal muscle activation for the first time during this session; however, unable to elicit subtalar eversion/forefoot pronation without movement being inhibited by said tone.  Gait Training: - While standing with shoes off (to monitor foot/ankle tone during weight shifting) and mirror anterior to pt for visual feedback, pt engaged in pre-gait activities focusing on anterior weight shift during L single limb stance/RLE advancement. provided min guard to mod A for stability/balance. Progressed from manual facilitation to verbal/tactile cueing of anterior weight shift. Tactile cueing also focused on upright posture (extension of thoracic spine, B hips), forward gaze, and L pelvic protraction. Noted less LUE/LLE tone when weight shift and pelvic/hip posture more normalized.  PT Education - 03/09/15 1607    Education provided Yes   Education Details Effect of posture, weight shift on tone in LUE and LLE.   Person(s) Educated Patient;Spouse   Methods Explanation;Demonstration;Tactile cues;Verbal cues   Comprehension Verbalized understanding          PT Short Term Goals - 03/07/15 1339    PT SHORT TERM GOAL #1   Title Pt will perform HEP with supervision of family, for improved strength and balance. (Target 01/10/15)   Time 4   Period Weeks   Status Achieved   PT SHORT TERM GOAL #2   Title Pt will improve Timed UP and Go score to less than or equal to 35 seconds for decreased fall risk.   Baseline  eval-46.78 sec   Time 4   Period Weeks   Status Not Met  46.81 with quad cane and close supervision and occasional CGA   PT SHORT TERM GOAL #3   Title Berg Balance score to be assessed, with pt scoring at least 5 additional points to decrease fall risk.   Time 4   Period Weeks   Status Achieved  Scored 33 today   PT SHORT TERM GOAL #4   Title Pt will perform at least 6 of 10 reps of sit<>stand transfers no UE support, with equal weightbearing through bilateral lower extremities, independently.   Time 4   Period Weeks   Status Achieved   PT SHORT TERM GOAL #5   Title Pt will improve TUG time from 29.69 sec to 25 seconds for decreased fall risk. Target date: 03/08/15.   Time 4   Period Weeks   Status On-going   PT SHORT TERM GOAL #6   Title Pt will ambulate x200' over level surfaces with SBQC and supervision with no overt LOB. Target date: 03/08/15.   Baseline Achieved 03/07/15.   Time 4   Period Weeks   Status Achieved           PT Long Term Goals - 03/07/15 1340    PT LONG TERM GOAL #1   Title Pt will verbalize understanding of fall prevention within home environment. Target 02/10/15   Time 8   Period Weeks   Status Achieved   PT LONG TERM GOAL #2   Title Pt will improve TUG to less than or equal to 25 seconds for decreased fall risk.   Time 8   Status Not Met  Unmet goal will continue as short-term goal   PT LONG TERM GOAL #3   Title Pt will improve gait velocity to at least 1 ft/sec for improved gait efficiency and safety.   Time 8   Period Weeks   Status Achieved   PT LONG TERM GOAL #4   Title Pt will ambulate at least 50-100 ft using hemiwalker, household distances, with supervision.   Time 8   Period Weeks   Status Achieved   PT LONG TERM GOAL #5   Title Pt will ambulate 150' over outdoor, unlevel surfaces with LRAD and supervision to indicate increased stability/independence with community mobility. Target date: 04/05/15.   Time 8   Period Weeks   Status  On-going   PT LONG TERM GOAL #6   Title Pt will negotiate 4 stairs with single rail and supervision to increase pt independence with community mobility. Target date: 04/05/15.   Time 8   Period Weeks   Status On-going   PT LONG TERM GOAL #7   Title Pt will increase gait speed  from 1.02 ft/sec to 1.62 ft/sec to indicate significant improvement in efficiency of ambulation. Target date: 04/05/15.   Time 8   Period Weeks   Status On-going               Plan - 03/09/15 1611    Clinical Impression Statement During gait training, pt demonstrated less LUE/LLE tone when able to perform full anterior weight shift during L single limb stance without onset of L pelvic retraction. Continue per POC.   Pt will benefit from skilled therapeutic intervention in order to improve on the following deficits Abnormal gait;Decreased activity tolerance;Decreased balance;Decreased mobility;Decreased range of motion;Decreased safety awareness;Difficulty walking;Decreased strength;Impaired tone;Decreased knowledge of use of DME;Decreased endurance;Decreased coordination;Impaired flexibility;Impaired perceived functional ability;Postural dysfunction;Impaired sensation;Impaired UE functional use;Impaired vision/preception;Decreased cognition;Pain   Rehab Potential Good   PT Frequency 2x / week   PT Duration 8 weeks   PT Treatment/Interventions ADLs/Self Care Home Management;Therapeutic activities;Functional mobility training;Stair training;Gait training;Therapeutic exercise;Balance training;Neuromuscular re-education;Patient/family education;DME Instruction;Cognitive remediation;Orthotic Fit/Training;Manual techniques;Passive range of motion;Visual/perceptual remediation/compensation   PT Next Visit Plan Check STG for TUG. Anterior weight shift during L single limb stance without L hip retraction.   Recommended Other Services Referring MD out of office week of 7/3-9. Follow up on botox 7/11.        Problem  List Patient Active Problem List   Diagnosis Date Noted  . Spastic hemiparesis affecting nondominant side 11/08/2014  . Hypothyroidism 10/27/2014  . Left-sided neglect 09/23/2014  . Acute ischemic right MCA stroke 09/16/2014  . Left hemiparesis   . CVA (cerebral vascular accident)   . Right carotid artery occlusion   . Acute right MCA stroke   . Essential hypertension 09/12/2014  . CVA (cerebral infarction) 09/12/2014  . Type 2 diabetes mellitus with peripheral neuropathy 09/12/2014    Billie Ruddy, PT, DPT [facility] [address] [place], [place], [address] Phone: [phone]   Fax:  [phone] 03/09/2015, 5:37 PM

## 2015-03-09 NOTE — Therapy (Signed)
[facility] [address] [place], [place], [address] Phone: [phone]   Fax:  [phone]  Occupational Therapy Treatment  Patient Details  Name: Brian Hall MRN: [id number] Date of Birth: 02-Aug-1956 Referring Provider:  Claretta Fraise, MD  Encounter Date: 03/09/2015      OT End of Session - 03/09/15 1700    Visit Number 23   Number of Visits 32   Date for OT Re-Evaluation 04/11/15   Authorization Type [employer]   OT Start Time 1324   OT Stop Time 1400   OT Time Calculation (min) 43 min   Activity Tolerance Patient tolerated treatment well      Past Medical History  Diagnosis Date  . Hypertension   . Diabetes mellitus   . Obesity   . CHF (congestive heart failure)   . SOB (shortness of breath)   . Hyperkalemia   . Renal insufficiency   . Cardiogenic shock   . Cardiac LV ejection fraction 10-20%   . Hyperlipemia   . Hypothyroidism   . Throat cancer 2011    s/p neck dissection, radiation, chemo  . Stroke   . Blurred vision   . Joint pain   . Joint swelling     Past Surgical History  Procedure Laterality Date  . Cardiac catheterization    . Neck dissection Right 2011    s/p resection of throat cancer with multiple nodes removed    There were no vitals filed for this visit.  Visit Diagnosis:  Hemiplegia affecting left nondominant side  Impaired functional mobility and activity tolerance  Impaired sensation  Impaired cognition      Subjective Assessment - 03/09/15 1327    Subjective  I feel like I have to pee all the time - but when I go I don't go much.  It just started 2-3 days ago. (Recommended pt call MD and pt and wife in agreement)   Patient is accompained by: Family member   Pertinent History see epic snapshot   Patient Stated Goals Get back to doing some of the things I used to do.                      OT Treatments/Exercises (OP) - 03/09/15 0001    ADLs   Functional Mobility Addressed functional ambulation and ambulatory toilet transfers - pt needs mod vc's for alignment, weight shift and increasing incorporation of LLE into ambulation - with improved postural control pt demonstrates decreasd flexor tone in LUE and is then able to use LUE as gross assist in activities with greater ease. Pt and wife educated regarding this and able to verbalize understanding. Pt required close supervision for ambulatory toilet transfers and close supervision for functional ambulation within the clinic on indoor even surfaces with no distractions.   Neurological Re-education Exercises   Other Exercises 1 Neuro re ed to address islolated elbow flexion and extension while over riding synergy influence of elbow flexion/internal rotation. Pt and wife shown activity to do at home to address this. Pt requires min - mod facilitation depending on amount of exertion activity requires. Pt continues to need feedback regarding grading, timing and direction of movements.                  OT Short Term Goals - 03/09/15 1658    OT SHORT TERM GOAL #1   Title Pt and wife will be mod I with HEP - 01/18/2015.  Goal dates adjusted as  pt will not return to clinic prior to 12/21/14   Status Achieved   OT SHORT TERM GOAL #2   Title Pt will require min a for LB dressing   Status Achieved   OT SHORT TERM GOAL #3   Title Pt will demonstrate ability to use LUE as gross assist during basic self care 50% of the time.   Status Partially Met   OT SHORT TERM GOAL #4   Title Pt will need no more than min vc's to compensate for L neglect during functional mobility tasks   Status Achieved   OT SHORT TERM GOAL #5   Title Pt will demonstrate ability to attend to familiar functional task for 15 minute with no more than one vc.   Status Achieved   OT SHORT TERM GOAL #6   Title Pt will require no more than min assist for toilet transfers   Status Achieved   OT SHORT TERM GOAL #7   Title Pt  and wife will be mod I with upgraded HEP prn - 03/14/2015   Status Achieved   OT SHORT TERM GOAL #8   Title Pt will be able to use LUE as gross assist 75% of the time for basic ADL tasks with min cues   Status Achieved   OT SHORT TERM GOAL  #9   TITLE Pt will require supervision for tub bench transfers    Status Deferred  pt is now stepping over ledge and into shower wtih min with wife to sit on seat   OT SHORT TERM GOAL  #10   TITLE Pt will require min a for simple snack prep at ambulatory level   Status Achieved           OT Long Term Goals - 03/09/15 1658    OT LONG TERM GOAL #1   Title Pt and wife will be mod I with upgraded HEP - 02/15/2015. Goal date adjusted as pt will not return to clinic prior to 12/21/2014   Status Achieved   OT LONG TERM GOAL #2   Title Pt will demonstrate ability to use LUE as gross assist 100% of the time in basic self care tasks   Status Not Met   OT LONG TERM GOAL #3   Title Pt will demonstrate ability to hold cylindrical object in L hand.    Status Achieved   OT LONG TERM GOAL #4   Title Pt will verbalize understanding of AE for buttoning, zipping and cutting   Status Achieved   OT LONG TERM GOAL #5   Title Pt will be supervision for toilet transfers   Status Achieved   OT LONG TERM GOAL #6   Title Pt will be min a for shower transfers   Status Achieved   OT LONG TERM GOAL #7   Title Pt will  be supervision for simple hot meal prep   Status Deferred   OT LONG TERM GOAL #8   Title Pt will demonstrate the ability to use LUE as gross a for basic ADL's 100% of the time - 04/11/2015   Status On-going   OT LONG TERM GOAL  #9   Baseline Pt will be mod I for toilet transfers   Status On-going   OT LONG TERM GOAL  #10   TITLE Pt will be supervision for simple hot meal prep   Status On-going   OT LONG TERM GOAL  #11   TITLE Pt will demonstrate ability for blilateral mid reach for functional  tasks   Status On-going               Plan -  03/09/15 1659    Clinical Impression Statement Pt continues to improve in ability to use LUE functionally at home as well as functional mobility. Pt does continue to require min - mod vc's for impulsivity depending on activity.   Pt will benefit from skilled therapeutic intervention in order to improve on the following deficits (Retired) Decreased balance;Decreased coordination;Decreased cognition;Decreased knowledge of use of DME;Decreased mobility;Decreased range of motion;Decreased safety awareness;Difficulty walking;Decreased strength;Increased edema;Increased muscle spasms;Impaired UE functional use;Impaired tone;Impaired sensation;Impaired vision/preception;Pain   Rehab Potential Good   Clinical Impairments Affecting Rehab Potential Pt will benefit from skilled OT to address deficits outlined in eval to maximize independence.   OT Frequency 2x / week   OT Duration 8 weeks   OT Treatment/Interventions Self-care/ADL training;Cryotherapy;Moist Heat;Electrical Stimulation;Fluidtherapy;DME and/or AE instruction;Energy conservation;Neuromuscular education;Therapeutic exercise;Functional Mobility Training;Manual Therapy;Passive range of motion;Splinting;Therapeutic activities;Balance training;Patient/family education;Cognitive remediation/compensation   Plan neuro re ed LUE, functional use of RUE, balance, postural alignment, safety   Consulted and Agree with Plan of Care Patient;Family member/caregiver   Family Member Consulted wife        Problem List Patient Active Problem List   Diagnosis Date Noted  . Spastic hemiparesis affecting nondominant side 11/08/2014  . Hypothyroidism 10/27/2014  . Left-sided neglect 09/23/2014  . Acute ischemic right MCA stroke 09/16/2014  . Left hemiparesis   . CVA (cerebral vascular accident)   . Right carotid artery occlusion   . Acute right MCA stroke   . Essential hypertension 09/12/2014  . CVA (cerebral infarction) 09/12/2014  . Type 2 diabetes  mellitus with peripheral neuropathy 09/12/2014    Quay Burow, OTR/L 03/09/2015, 5:03 PM  [facility] [address] [place], [place], [address] Phone: [phone]   Fax:  [phone]

## 2015-03-14 ENCOUNTER — Ambulatory Visit: Payer: 59 | Admitting: Occupational Therapy

## 2015-03-14 ENCOUNTER — Encounter: Payer: Self-pay | Admitting: Occupational Therapy

## 2015-03-14 ENCOUNTER — Ambulatory Visit: Payer: 59 | Admitting: Physical Therapy

## 2015-03-14 ENCOUNTER — Telehealth: Payer: Self-pay | Admitting: *Deleted

## 2015-03-14 DIAGNOSIS — R278 Other lack of coordination: Secondary | ICD-10-CM

## 2015-03-14 DIAGNOSIS — G8114 Spastic hemiplegia affecting left nondominant side: Secondary | ICD-10-CM

## 2015-03-14 DIAGNOSIS — Z7409 Other reduced mobility: Secondary | ICD-10-CM

## 2015-03-14 DIAGNOSIS — R269 Unspecified abnormalities of gait and mobility: Secondary | ICD-10-CM | POA: Diagnosis not present

## 2015-03-14 DIAGNOSIS — R4189 Other symptoms and signs involving cognitive functions and awareness: Secondary | ICD-10-CM

## 2015-03-14 DIAGNOSIS — G8194 Hemiplegia, unspecified affecting left nondominant side: Secondary | ICD-10-CM

## 2015-03-14 DIAGNOSIS — R201 Hypoesthesia of skin: Secondary | ICD-10-CM

## 2015-03-14 NOTE — Telephone Encounter (Signed)
Benjie Karvonen called from outpt  PT.  She has entered a message under a telephone call on 02/24/15 she would like Dr Letta Pate to review.  If he has questions or advise please callher at outpt rehab [phone]

## 2015-03-14 NOTE — Patient Instructions (Signed)
Abduction: Clam (Eccentric) - Side-Lying   Lie on side with knees bent. Lift top knee, keeping feet together. Keep trunk steady. Slowly lower for 3-5 seconds. __15_ reps per set, __3_ sets per day.  Copyright  [employer]. All rights reserved.

## 2015-03-14 NOTE — Therapy (Signed)
[facility] [address] [place], [place], [address] Phone: [phone]   Fax:  [phone]  Occupational Therapy Treatment  Patient Details  Name: Brian Hall MRN: [id number] Date of Birth: February 11, 1956 Referring Provider:  Claretta Fraise, MD  Encounter Date: 03/14/2015      OT End of Session - 03/14/15 1254    Visit Number 24   Number of Visits 32   Date for OT Re-Evaluation 04/11/15   Authorization Type [employer]   OT Start Time 1100   OT Stop Time 1146   OT Time Calculation (min) 46 min   Activity Tolerance Patient tolerated treatment well   Behavior During Therapy [facility] for tasks assessed/performed      Past Medical History  Diagnosis Date  . Hypertension   . Diabetes mellitus   . Obesity   . CHF (congestive heart failure)   . SOB (shortness of breath)   . Hyperkalemia   . Renal insufficiency   . Cardiogenic shock   . Cardiac LV ejection fraction 10-20%   . Hyperlipemia   . Hypothyroidism   . Throat cancer 2011    s/p neck dissection, radiation, chemo  . Stroke   . Blurred vision   . Joint pain   . Joint swelling     Past Surgical History  Procedure Laterality Date  . Cardiac catheterization    . Neck dissection Right 2011    s/p resection of throat cancer with multiple nodes removed    There were no vitals filed for this visit.  Visit Diagnosis:  Hemiplegia affecting left nondominant side  Impaired sensation  Impaired functional mobility and activity tolerance  Impaired cognition      Subjective Assessment - 03/14/15 1244    Subjective  I am trying to not let my arm draw up so much when I stand up.   Patient is accompained by: Family member   Pertinent History see epic snapshot   Patient Stated Goals Get back to doing some of the things I used to do.   Currently in Pain? No/denies   Pain Score 0-No pain                      OT Treatments/Exercises (OP) -  03/14/15 0001    Neurological Re-education Exercises   Other Exercises 1 Neuromuscular reeducation to address movement outside of synergistic pattern.  Patient able to follow cueing to reduce overuse of shoulder elevation, and internal rotation to address more distal elbow, forearm and finger control.  Patient with tendency to move quickly, and responded well to both verbal and physical facilitation / inhibition to change pattern of both muscle recruitment, as well as grading of muscle force.     Other Weight-Bearing Exercises 1 Weight bearing in sitting position, long arm placed at his side.  Patient able to respond to verbal cueing to push straight down through heel of hand and inhibit internal rotation at shoulder.  Patient actually able to feel internal rotation, and override it during this task.  Followed immediately with active relaxation of wrist flexors, and isolated finger flexion.  Practiced grasp and release with left hand with mod assist   Functional Reaching Activities   Low Level Reaching forward below waist height with guiding faciltation for left arm to foster more typical reach pattern of shoulder felxion with elbow extension.  Reaching toward floor, toward chair in front with min guiding assistance.  Stabilizing arms on large ball during functional mobility (  sit to stand transitions)                OT Education - 03/14/15 1253    Education provided Yes   Education Details discussed the need to reduce power with attempts at using left arm.  Discussed postural changes which would help left arm functioning   Person(s) Educated Patient;Spouse   Methods Explanation;Demonstration;Tactile cues;Verbal cues   Comprehension Verbalized understanding          OT Short Term Goals - 03/09/15 1658    OT SHORT TERM GOAL #1   Title Pt and wife will be mod I with HEP - 01/18/2015.  Goal dates adjusted as pt will not return to clinic prior to 12/21/14   Status Achieved   OT SHORT TERM  GOAL #2   Title Pt will require min a for LB dressing   Status Achieved   OT SHORT TERM GOAL #3   Title Pt will demonstrate ability to use LUE as gross assist during basic self care 50% of the time.   Status Partially Met   OT SHORT TERM GOAL #4   Title Pt will need no more than min vc's to compensate for L neglect during functional mobility tasks   Status Achieved   OT SHORT TERM GOAL #5   Title Pt will demonstrate ability to attend to familiar functional task for 15 minute with no more than one vc.   Status Achieved   OT SHORT TERM GOAL #6   Title Pt will require no more than min assist for toilet transfers   Status Achieved   OT SHORT TERM GOAL #7   Title Pt and wife will be mod I with upgraded HEP prn - 03/14/2015   Status Achieved   OT SHORT TERM GOAL #8   Title Pt will be able to use LUE as gross assist 75% of the time for basic ADL tasks with min cues   Status Achieved   OT SHORT TERM GOAL  #9   TITLE Pt will require supervision for tub bench transfers    Status Deferred  pt is now stepping over ledge and into shower wtih min with wife to sit on seat   OT SHORT TERM GOAL  #10   TITLE Pt will require min a for simple snack prep at ambulatory level   Status Achieved           OT Long Term Goals - 03/09/15 1658    OT LONG TERM GOAL #1   Title Pt and wife will be mod I with upgraded HEP - 02/15/2015. Goal date adjusted as pt will not return to clinic prior to 12/21/2014   Status Achieved   OT LONG TERM GOAL #2   Title Pt will demonstrate ability to use LUE as gross assist 100% of the time in basic self care tasks   Status Not Met   OT LONG TERM GOAL #3   Title Pt will demonstrate ability to hold cylindrical object in L hand.    Status Achieved   OT LONG TERM GOAL #4   Title Pt will verbalize understanding of AE for buttoning, zipping and cutting   Status Achieved   OT LONG TERM GOAL #5   Title Pt will be supervision for toilet transfers   Status Achieved   OT LONG  TERM GOAL #6   Title Pt will be min a for shower transfers   Status Achieved   OT LONG TERM GOAL #7   Title  Pt will  be supervision for simple hot meal prep   Status Deferred   OT LONG TERM GOAL #8   Title Pt will demonstrate the ability to use LUE as gross a for basic ADL's 100% of the time - 04/11/2015   Status On-going   OT LONG TERM GOAL  #9   Baseline Pt will be mod I for toilet transfers   Status On-going   OT LONG TERM GOAL  #10   TITLE Pt will be supervision for simple hot meal prep   Status On-going   OT LONG TERM GOAL  #11   TITLE Pt will demonstrate ability for blilateral mid reach for functional tasks   Status On-going               Plan - 03/14/15 1255    Clinical Impression Statement Patient showing significant improvement with functional mobility at home, patient showing improved control of active movement in left upper extremity, and consistently attempts to incorporate into fucntional tasks.     Pt will benefit from skilled therapeutic intervention in order to improve on the following deficits (Retired) Decreased balance;Decreased coordination;Decreased cognition;Decreased knowledge of use of DME;Decreased mobility;Decreased range of motion;Decreased safety awareness;Difficulty walking;Decreased strength;Increased edema;Increased muscle spasms;Impaired UE functional use;Impaired tone;Impaired sensation;Impaired vision/preception;Pain   Rehab Potential Good   Clinical Impairments Affecting Rehab Potential Pt will benefit from skilled OT to address deficits outlined in eval to maximize independence.   OT Frequency 2x / week   OT Duration 8 weeks   OT Treatment/Interventions Self-care/ADL training;Cryotherapy;Moist Heat;Electrical Stimulation;Fluidtherapy;DME and/or AE instruction;Energy conservation;Neuromuscular education;Therapeutic exercise;Functional Mobility Training;Manual Therapy;Passive range of motion;Splinting;Therapeutic activities;Balance  training;Patient/family education;Cognitive remediation/compensation   Plan neuro reed left UE, functional use of LUE, balance, postural control / alignment, safety   OT Home Exercise Plan issued inital HEP-12/27/14, cane shoulder flexion 01/03/15   Consulted and Agree with Plan of Care Patient;Family member/caregiver   Family Member Consulted wife        Problem List Patient Active Problem List   Diagnosis Date Noted  . Spastic hemiparesis affecting nondominant side 11/08/2014  . Hypothyroidism 10/27/2014  . Left-sided neglect 09/23/2014  . Acute ischemic right MCA stroke 09/16/2014  . Left hemiparesis   . CVA (cerebral vascular accident)   . Right carotid artery occlusion   . Acute right MCA stroke   . Essential hypertension 09/12/2014  . CVA (cerebral infarction) 09/12/2014  . Type 2 diabetes mellitus with peripheral neuropathy 09/12/2014    Mariah Milling, OTR/L 03/14/2015, 12:58 PM  [facility] [address] [place], [place], [address] Phone: [phone]   Fax:  [phone]

## 2015-03-15 NOTE — Therapy (Signed)
[facility] [address] [place], [place], [address] Phone: [phone]   Fax:  [phone]  Physical Therapy Treatment  Patient Details  Name: Brian Hall MRN: [id number] Date of Birth: 06/23/1956 Referring Provider:  Claretta Fraise, MD  Encounter Date: 03/14/2015      PT End of Session - 03/14/15 1354    Visit Number 23   Number of Visits 33   Date for PT Re-Evaluation 04/08/15   PT Start Time 8882   PT Stop Time 1100   PT Time Calculation (min) 45 min   Equipment Utilized During Treatment Gait belt   Activity Tolerance Patient tolerated treatment well   Behavior During Therapy [facility] for tasks assessed/performed      Past Medical History  Diagnosis Date  . Hypertension   . Diabetes mellitus   . Obesity   . CHF (congestive heart failure)   . SOB (shortness of breath)   . Hyperkalemia   . Renal insufficiency   . Cardiogenic shock   . Cardiac LV ejection fraction 10-20%   . Hyperlipemia   . Hypothyroidism   . Throat cancer 2011    s/p neck dissection, radiation, chemo  . Stroke   . Blurred vision   . Joint pain   . Joint swelling     Past Surgical History  Procedure Laterality Date  . Cardiac catheterization    . Neck dissection Right 2011    s/p resection of throat cancer with multiple nodes removed    There were no vitals filed for this visit.  Visit Diagnosis:  Coordination abnormal  Abnormality of gait  Left spastic hemiplegia      Subjective Assessment - 03/14/15 1018    Subjective Pt denies falls, changes. Has been thinking about keeping left hip from dropping while walking.   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  He has a motorcycle and would like to ride it again.    Currently in Pain? Yes   Pain Score 2    Pain Location Ankle   Pain  Orientation Left   Pain Descriptors / Indicators Aching   Pain Type Chronic pain   Pain Onset More than a month ago   Pain Frequency Intermittent   Aggravating Factors  walking, wearing brace too long   Pain Relieving Factors taking brace off   Multiple Pain Sites No      Treatment   Gait Training: - Standing in front of mat table with mirror anterior to pt for visual feedback, performed pre-gait with focus on RLE advancement without L pelvic retraction, R hip drop (L Trendelenberg). Pt required min guard to max A (secondary to intermittent LOB (most often posterior and to R side).  Neuro Re-ed: Pt performed static in tall kneeling to increase proximal stability/control, increase proprioceptive input and LLE co-contraction. When pt able to maintain tall kneeling without UE support x30 seconds without LOB, L pelvic retraction, progressed to sliding R knee forward/backwards 4-6" while maintaining L pelvic protraction, L hip extension. Multimodal cueing provided to accomplish this; however, most effective verbal cue appeared to be "keep your belly button forward" throughout.  Therapeutic Exercises: Explained, demonstrated L clamshell home exercise to reiterate importance of  Alignment, quality of movement to elicit L gluteus medius activation. Cueing focused on pelvic alignment, preventing hip from moving posteriorly. Wife  gave effective return demonstration of effective cueing. Pt with effective return demonstration.                           PT Education - 03/14/15 1349    Education provided Yes   Education Details Readdressed clamshells with focus on quality of movement, gluteus medius activation.   Person(s) Educated Patient;Spouse   Methods Explanation;Demonstration;Verbal cues;Tactile cues   Comprehension Verbalized understanding;Returned demonstration          PT Short Term Goals - 03/07/15 1339    PT SHORT TERM GOAL #1   Title Pt will perform HEP with  supervision of family, for improved strength and balance. (Target 01/10/15)   Time 4   Period Weeks   Status Achieved   PT SHORT TERM GOAL #2   Title Pt will improve Timed UP and Go score to less than or equal to 35 seconds for decreased fall risk.   Baseline eval-46.78 sec   Time 4   Period Weeks   Status Not Met  46.81 with quad cane and close supervision and occasional CGA   PT SHORT TERM GOAL #3   Title Berg Balance score to be assessed, with pt scoring at least 5 additional points to decrease fall risk.   Time 4   Period Weeks   Status Achieved  Scored 33 today   PT SHORT TERM GOAL #4   Title Pt will perform at least 6 of 10 reps of sit<>stand transfers no UE support, with equal weightbearing through bilateral lower extremities, independently.   Time 4   Period Weeks   Status Achieved   PT SHORT TERM GOAL #5   Title Pt will improve TUG time from 29.69 sec to 25 seconds for decreased fall risk. Target date: 03/08/15.   Time 4   Period Weeks   Status On-going   PT SHORT TERM GOAL #6   Title Pt will ambulate x200' over level surfaces with SBQC and supervision with no overt LOB. Target date: 03/08/15.   Baseline Achieved 03/07/15.   Time 4   Period Weeks   Status Achieved           PT Long Term Goals - 03/14/15 1020    PT LONG TERM GOAL #1   Title Pt will verbalize understanding of fall prevention within home environment. Target 02/10/15   Time 8   Period Weeks   Status Achieved   PT LONG TERM GOAL #2   Title Pt will improve TUG to less than or equal to 25 seconds for decreased fall risk.   Baseline 7/12: 31.87 sec with SBQC   Time 8   Status Not Met  Unmet goal will continue as short-term goal   PT LONG TERM GOAL #3   Title Pt will improve gait velocity to at least 1 ft/sec for improved gait efficiency and safety.   Time 8   Period Weeks   Status Achieved   PT LONG TERM GOAL #4   Title Pt will ambulate at least 50-100 ft using hemiwalker, household distances, with  supervision.   Time 8   Period Weeks   Status Achieved   PT LONG TERM GOAL #5   Title Pt will ambulate 150' over outdoor, unlevel surfaces with LRAD and supervision to indicate increased stability/independence with community mobility. Target date: 04/05/15.   Time 8   Period Weeks   Status On-going   PT LONG TERM GOAL #6   Title  Pt will negotiate 4 stairs with single rail and supervision to increase pt independence with community mobility. Target date: 04/05/15.   Time 8   Period Weeks   Status On-going   PT LONG TERM GOAL #7   Title Pt will increase gait speed from 1.02 ft/sec to 1.62 ft/sec to indicate significant improvement in efficiency of ambulation. Target date: 04/05/15.   Time 8   Period Weeks   Status On-going               Plan - 03/15/15 0755    Clinical Impression Statement Focused on decreasing L pelvic retraction during RLE advancement. Pt exhibiting increased awareness of L hip posiiton during standing/ambulation. Pt did not meet STG for TUG, which is likely attributable to focus of PT being symmetry of LE positioning with transfers and slow, graded movement (as opposed to speed of movement, as measured by Timed Up and Go Test). Will consider discontinuing this goal for this reason. Continue per POC.   Pt will benefit from skilled therapeutic intervention in order to improve on the following deficits Abnormal gait;Decreased activity tolerance;Decreased balance;Decreased mobility;Decreased range of motion;Decreased safety awareness;Difficulty walking;Decreased strength;Impaired tone;Decreased knowledge of use of DME;Decreased endurance;Decreased coordination;Impaired flexibility;Impaired perceived functional ability;Postural dysfunction;Impaired sensation;Impaired UE functional use;Impaired vision/preception;Decreased cognition;Pain   Rehab Potential Good   PT Frequency 2x / week   PT Duration 8 weeks   PT Treatment/Interventions ADLs/Self Care Home Management;Therapeutic  activities;Functional mobility training;Stair training;Gait training;Therapeutic exercise;Balance training;Neuromuscular re-education;Patient/family education;DME Instruction;Cognitive remediation;Orthotic Fit/Training;Manual techniques;Passive range of motion;Visual/perceptual remediation/compensation   PT Next Visit Plan  Anterior weight shift during L single limb stance without L hip retraction. Continue to address communiy mobility (curb step negotiation) and stairs.   Recommended Other Services Called referring MD on  morning of 03/14/15. Left message for MD to read telephone encounter from 6/24.   Consulted and Agree with Plan of Care Patient;Family member/caregiver   Family Member Consulted wife, [name]        Problem List Patient Active Problem List   Diagnosis Date Noted  . Spastic hemiparesis affecting nondominant side 11/08/2014  . Hypothyroidism 10/27/2014  . Left-sided neglect 09/23/2014  . Acute ischemic right MCA stroke 09/16/2014  . Left hemiparesis   . CVA (cerebral vascular accident)   . Right carotid artery occlusion   . Acute right MCA stroke   . Essential hypertension 09/12/2014  . CVA (cerebral infarction) 09/12/2014  . Type 2 diabetes mellitus with peripheral neuropathy 09/12/2014   Billie Ruddy, PT, DPT [facility] [address] [place], [place], [address] Phone: [phone]   Fax:  [phone] 03/15/2015, 7:59 AM

## 2015-03-16 ENCOUNTER — Encounter: Payer: Self-pay | Admitting: Occupational Therapy

## 2015-03-16 ENCOUNTER — Ambulatory Visit: Payer: 59 | Admitting: Occupational Therapy

## 2015-03-16 ENCOUNTER — Ambulatory Visit: Payer: 59 | Admitting: Physical Therapy

## 2015-03-16 DIAGNOSIS — R4189 Other symptoms and signs involving cognitive functions and awareness: Secondary | ICD-10-CM

## 2015-03-16 DIAGNOSIS — G8194 Hemiplegia, unspecified affecting left nondominant side: Secondary | ICD-10-CM

## 2015-03-16 DIAGNOSIS — G8114 Spastic hemiplegia affecting left nondominant side: Secondary | ICD-10-CM

## 2015-03-16 DIAGNOSIS — R201 Hypoesthesia of skin: Secondary | ICD-10-CM

## 2015-03-16 DIAGNOSIS — R269 Unspecified abnormalities of gait and mobility: Secondary | ICD-10-CM

## 2015-03-16 DIAGNOSIS — R278 Other lack of coordination: Secondary | ICD-10-CM

## 2015-03-16 NOTE — Therapy (Signed)
[facility] [address] [place], [place], [address] Phone: [phone]   Fax:  [phone]  Physical Therapy Treatment  Patient Details  Name: Brian Hall MRN: [id number] Date of Birth: 1956-08-09 Referring Provider:  Claretta Fraise, MD  Encounter Date: 03/16/2015      PT End of Session - 03/16/15 1203    Visit Number 24   Number of Visits 33   Date for PT Re-Evaluation 04/08/15   PT Start Time 1105   PT Stop Time 1145   PT Time Calculation (min) 40 min   Equipment Utilized During Treatment Gait belt   Activity Tolerance Patient tolerated treatment well   Behavior During Therapy [facility] for tasks assessed/performed      Past Medical History  Diagnosis Date  . Hypertension   . Diabetes mellitus   . Obesity   . CHF (congestive heart failure)   . SOB (shortness of breath)   . Hyperkalemia   . Renal insufficiency   . Cardiogenic shock   . Cardiac LV ejection fraction 10-20%   . Hyperlipemia   . Hypothyroidism   . Throat cancer 2011    s/p neck dissection, radiation, chemo  . Stroke   . Blurred vision   . Joint pain   . Joint swelling     Past Surgical History  Procedure Laterality Date  . Cardiac catheterization    . Neck dissection Right 2011    s/p resection of throat cancer with multiple nodes removed    There were no vitals filed for this visit.  Visit Diagnosis:  Abnormality of gait  Coordination abnormal  Hemiplegia affecting left nondominant side      Subjective Assessment - 03/16/15 1157    Subjective "I think I get what you're saying about walking. I need to keep weight on my left leg for a whole lot longer."  No falls to report.   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  He has a motorcycle and would like to ride it again.    Currently in Pain?  Yes   Pain Score 2    Pain Location Ankle   Pain Orientation Left   Pain Descriptors / Indicators Aching   Pain Type Chronic pain   Pain Onset More than a month ago   Pain Frequency Intermittent   Aggravating Factors  walking, wearing brace too long   Pain Relieving Factors taking brace off, resting, stretching   Multiple Pain Sites No         Treatment   Neuro Re-ed: - Supine: prolonged passive stretch of L ankle plantarflexors x3 minutes for tone management and associated pain. -Tall kneeling to increase proximal stability/control, increase proprioceptive input, LLE co-contraction to increase anterior/lateral weight shift during RLE advancement, LLE stance stability. When pt able to maintain tall kneeling without UE support x30 seconds without LOB, L pelvic retraction, progressed to sliding R knee forward/backwards 4-6" while maintaining L pelvic protraction, L hip extension. Noted activation of L gluteus medius/maximus throughout.  Gait Training: -See below for details on gait deviations, distance of gait trials. Initial trial x115' with [facility]; subsequent trial x75' without AD with min guard-min A. Cueing focused on slow movement (to decrease spasticity) and upright posture, L pelvic protraction. Pt exhibiting improved awareness of posture, weight shift during gait trials.  [facility] Adult PT Treatment/Exercise - 03/16/15 1208    Transfers   Transfers Stand to Sit;Sit to Stand;Stand Pivot Transfers   Sit to Stand 6: Modified independent (Device/Increase time)   Stand to Sit 6: Modified independent (Device/Increase time)   Ambulation/Gait   Ambulation/Gait Yes   Ambulation/Gait Assistance 5: Supervision; Min A; Min guard   Ambulation Distance (Feet) 190 Feet  [phone]' with [facility] and (S) then x75' without AD with min guard-min A   Assistive device Small based quad cane; None   Gait Pattern Step-to pattern;Decreased step length - right;Decreased stance time -  left;Decreased hip/knee flexion - left;Decreased weight shift to left;Trunk rotated posteriorly on left   Stairs --   Stairs Assistance --   Stair Management Technique --   Number of Stairs --   Height of Stairs --   Ramp --   Curb --                  PT Short Term Goals - 03/07/15 1339    PT SHORT TERM GOAL #1   Title Pt will perform HEP with supervision of family, for improved strength and balance. (Target 01/10/15)   Time 4   Period Weeks   Status Achieved   PT SHORT TERM GOAL #2   Title Pt will improve Timed UP and Go score to less than or equal to 35 seconds for decreased fall risk.   Baseline eval-46.78 sec   Time 4   Period Weeks   Status Not Met  46.81 with quad cane and close supervision and occasional CGA   PT SHORT TERM GOAL #3   Title Berg Balance score to be assessed, with pt scoring at least 5 additional points to decrease fall risk.   Time 4   Period Weeks   Status Achieved  Scored 33 today   PT SHORT TERM GOAL #4   Title Pt will perform at least 6 of 10 reps of sit<>stand transfers no UE support, with equal weightbearing through bilateral lower extremities, independently.   Time 4   Period Weeks   Status Achieved   PT SHORT TERM GOAL #5   Title Pt will improve TUG time from 29.69 sec to 25 seconds for decreased fall risk. Target date: 03/08/15.   Time 4   Period Weeks   Status On-going   PT SHORT TERM GOAL #6   Title Pt will ambulate x200' over level surfaces with SBQC and supervision with no overt LOB. Target date: 03/08/15.   Baseline Achieved 03/07/15.   Time 4   Period Weeks   Status Achieved           PT Long Term Goals - 03/14/15 1020    PT LONG TERM GOAL #1   Title Pt will verbalize understanding of fall prevention within home environment. Target 02/10/15   Time 8   Period Weeks   Status Achieved   PT LONG TERM GOAL #2   Title Pt will improve TUG to less than or equal to 25 seconds for decreased fall risk.   Baseline 7/12: 31.87 sec  with SBQC   Time 8   Status Not Met  Unmet goal will continue as short-term goal   PT LONG TERM GOAL #3   Title Pt will improve gait velocity to at least 1 ft/sec for improved gait efficiency and safety.   Time 8   Period Weeks   Status Achieved   PT LONG TERM GOAL #4   Title Pt will ambulate  at least 50-100 ft using hemiwalker, household distances, with supervision.   Time 8   Period Weeks   Status Achieved   PT LONG TERM GOAL #5   Title Pt will ambulate 150' over outdoor, unlevel surfaces with LRAD and supervision to indicate increased stability/independence with community mobility. Target date: 04/05/15.   Time 8   Period Weeks   Status On-going   PT LONG TERM GOAL #6   Title Pt will negotiate 4 stairs with single rail and supervision to increase pt independence with community mobility. Target date: 04/05/15.   Time 8   Period Weeks   Status On-going   PT LONG TERM GOAL #7   Title Pt will increase gait speed from 1.02 ft/sec to 1.62 ft/sec to indicate significant improvement in efficiency of ambulation. Target date: 04/05/15.   Time 8   Period Weeks   Status On-going               Plan - 03/16/15 1205    Clinical Impression Statement Addressed extensor/supination tone in L ankle, L hip retraction during L sigle limb stance/RLE advancement. Pt demonstrating improved understanding, carryover of L stance stability between sessions. Continue per POC.   Pt will benefit from skilled therapeutic intervention in order to improve on the following deficits Abnormal gait;Decreased activity tolerance;Decreased balance;Decreased mobility;Decreased range of motion;Decreased safety awareness;Difficulty walking;Decreased strength;Impaired tone;Decreased knowledge of use of DME;Decreased endurance;Decreased coordination;Impaired flexibility;Impaired perceived functional ability;Postural dysfunction;Impaired sensation;Impaired UE functional use;Impaired vision/preception;Decreased cognition;Pain    Rehab Potential Good   PT Frequency 2x / week   PT Duration 8 weeks   PT Treatment/Interventions ADLs/Self Care Home Management;Therapeutic activities;Functional mobility training;Stair training;Gait training;Therapeutic exercise;Balance training;Neuromuscular re-education;Patient/family education;DME Instruction;Cognitive remediation;Orthotic Fit/Training;Manual techniques;Passive range of motion;Visual/perceptual remediation/compensation   PT Next Visit Plan  Anterior weight shift during L single limb stance without L hip retraction. Continue to address community mobility (curb step negotiation) and stairs.   Consulted and Agree with Plan of Care Patient;Family member/caregiver   Family Member Consulted wife, [name]        Problem List Patient Active Problem List   Diagnosis Date Noted  . Spastic hemiparesis affecting nondominant side 11/08/2014  . Hypothyroidism 10/27/2014  . Left-sided neglect 09/23/2014  . Acute ischemic right MCA stroke 09/16/2014  . Left hemiparesis   . CVA (cerebral vascular accident)   . Right carotid artery occlusion   . Acute right MCA stroke   . Essential hypertension 09/12/2014  . CVA (cerebral infarction) 09/12/2014  . Type 2 diabetes mellitus with peripheral neuropathy 09/12/2014   Billie Ruddy, PT, DPT [facility] [address] [place], [place], [address] Phone: [phone]   Fax:  [phone] 03/16/2015, 12:16 PM

## 2015-03-16 NOTE — Therapy (Signed)
Brian Hall [address] [place], [place], [address] Phone: [phone]   Fax:  [phone]  Occupational Therapy Treatment  Patient Details  Name: Brian Hall MRN: [id number] Date of Birth: [birth year] Referring Provider:  Claretta Fraise, MD  Encounter Date: 03/16/2015      OT End of Session - 03/16/15 1519    Visit Number 25   Number of Visits 32   Date for OT Re-Evaluation 04/11/15   Authorization Type [employer]   OT Start Time 1147   OT Stop Time 1230   OT Time Calculation (min) 43 min   Activity Tolerance Patient tolerated treatment well   Behavior During Therapy Brian Hall for tasks assessed/performed      Past Medical History  Diagnosis Date  . Hypertension   . Diabetes mellitus   . Obesity   . CHF (congestive heart failure)   . SOB (shortness of breath)   . Hyperkalemia   . Renal insufficiency   . Cardiogenic shock   . Cardiac LV ejection fraction 10-20%   . Hyperlipemia   . Hypothyroidism   . Throat cancer 2011    s/p neck dissection, radiation, chemo  . Stroke   . Blurred vision   . Joint pain   . Joint swelling     Past Surgical History  Procedure Laterality Date  . Cardiac catheterization    . Neck dissection Right 2011    s/p resection of throat cancer with multiple nodes removed    There were no vitals filed for this visit.  Visit Diagnosis:  Hemiplegia affecting left nondominant side  Left spastic hemiplegia  Impaired cognition  Impaired sensation      Subjective Assessment - 03/16/15 1512    Subjective  [name] told me I had to leave "I can't" at home today.     Patient is accompained by: Family member   Pertinent History see epic snapshot   Patient Stated Goals Get back to doing some of the things I used to do.   Currently in Pain? No/denies   Pain Score 0-No pain                      OT Treatments/Exercises (OP) - 03/16/15 0001    Neurological  Re-education Exercises   Other Exercises 1 Neuromuscular reeducation to address left upper extremity volitional control.  Patient with very strong synergistic pattern in left arm, all movement initially initiates from shoulder elevation, and/or internal rotation.  With support to forearm, patient able to engage distal left arm - forearm, wrist and digits.  Patient needs proprioceptive input as well as immediate feedback to retrain motor recruitment patterns.  Worked on grasping and releasing grasp on various shaped/sized objects, worked on pincer grasp between thumb and index to turn over playing cards using supination of forearm.  Wife present for this session.     Other Weight-Bearing Exercises 1 Weight bearing on forearm in modified quadruped to reduce muscle tone in left upper extrmeity, prior to neuromuscular reeducation as indicated above.                  OT Education - 03/16/15 1518    Education Details Reinforced concepts of decreased muscle force for upper extremity use.  Grading of force   Person(s) Educated Patient;Spouse   Methods Explanation;Demonstration   Comprehension Verbalized understanding;Verbal cues required;Returned demonstration          OT Short Term Goals - 03/09/15 1658  OT SHORT TERM GOAL #1   Title Pt and wife will be mod I with HEP - 01/18/2015.  Goal dates adjusted as pt will not return to clinic prior to 12/21/14   Status Achieved   OT SHORT TERM GOAL #2   Title Pt will require min a for LB dressing   Status Achieved   OT SHORT TERM GOAL #3   Title Pt will demonstrate ability to use LUE as gross assist during basic self care 50% of the time.   Status Partially Met   OT SHORT TERM GOAL #4   Title Pt will need no more than min vc's to compensate for L neglect during functional mobility tasks   Status Achieved   OT SHORT TERM GOAL #5   Title Pt will demonstrate ability to attend to familiar functional task for 15 minute with no more than one vc.    Status Achieved   OT SHORT TERM GOAL #6   Title Pt will require no more than min assist for toilet transfers   Status Achieved   OT SHORT TERM GOAL #7   Title Pt and wife will be mod I with upgraded HEP prn - 03/14/2015   Status Achieved   OT SHORT TERM GOAL #8   Title Pt will be able to use LUE as gross assist 75% of the time for basic ADL tasks with min cues   Status Achieved   OT SHORT TERM GOAL  #9   TITLE Pt will require supervision for tub bench transfers    Status Deferred  pt is now stepping over ledge and into shower wtih min with wife to sit on seat   OT SHORT TERM GOAL  #10   TITLE Pt will require min a for simple snack prep at ambulatory level   Status Achieved           OT Long Term Goals - 03/09/15 1658    OT LONG TERM GOAL #1   Title Pt and wife will be mod I with upgraded HEP - 02/15/2015. Goal date adjusted as pt will not return to clinic prior to 12/21/2014   Status Achieved   OT LONG TERM GOAL #2   Title Pt will demonstrate ability to use LUE as gross assist 100% of the time in basic self care tasks   Status Not Met   OT LONG TERM GOAL #3   Title Pt will demonstrate ability to hold cylindrical object in L hand.    Status Achieved   OT LONG TERM GOAL #4   Title Pt will verbalize understanding of AE for buttoning, zipping and cutting   Status Achieved   OT LONG TERM GOAL #5   Title Pt will be supervision for toilet transfers   Status Achieved   OT LONG TERM GOAL #6   Title Pt will be min a for shower transfers   Status Achieved   OT LONG TERM GOAL #7   Title Pt will  be supervision for simple hot meal prep   Status Deferred   OT LONG TERM GOAL #8   Title Pt will demonstrate the ability to use LUE as gross a for basic ADL's 100% of the time - 04/11/2015   Status On-going   OT LONG TERM GOAL  #9   Baseline Pt will be mod I for toilet transfers   Status On-going   OT LONG TERM GOAL  #10   TITLE Pt will be supervision for simple hot meal prep  Status  On-going   OT LONG TERM GOAL  #11   TITLE Pt will demonstrate ability for blilateral mid reach for functional tasks   Status On-going               Plan - 03/16/15 1519    Clinical Impression Statement Patient showing steady improvement in occupational therapy, due to improved functional mobility, increased awareness of motor patterns in left upper extremity, and consistent attempts to incorporate into daily life skills   Pt will benefit from skilled therapeutic intervention in order to improve on the following deficits (Retired) Decreased balance;Decreased coordination;Decreased cognition;Decreased knowledge of use of DME;Decreased mobility;Decreased range of motion;Decreased safety awareness;Difficulty walking;Decreased strength;Increased edema;Increased muscle spasms;Impaired UE functional use;Impaired tone;Impaired sensation;Impaired vision/preception;Pain   Rehab Potential Good   Clinical Impairments Affecting Rehab Potential Pt will benefit from skilled OT to address deficits outlined in eval to maximize independence.   OT Frequency 2x / week   OT Duration 8 weeks   OT Treatment/Interventions Self-care/ADL training;Cryotherapy;Moist Heat;Electrical Stimulation;Fluidtherapy;DME and/or AE instruction;Energy conservation;Neuromuscular education;Therapeutic exercise;Functional Mobility Training;Manual Therapy;Passive range of motion;Splinting;Therapeutic activities;Balance training;Patient/family education;Cognitive remediation/compensation   Consulted and Agree with Plan of Care Patient;Family member/caregiver   Family Member Consulted wife        Problem List Patient Active Problem List   Diagnosis Date Noted  . Spastic hemiparesis affecting nondominant side 11/08/2014  . Hypothyroidism 10/27/2014  . Left-sided neglect 09/23/2014  . Acute ischemic right MCA stroke 09/16/2014  . Left hemiparesis   . CVA (cerebral vascular accident)   . Right carotid artery occlusion   .  Acute right MCA stroke   . Essential hypertension 09/12/2014  . CVA (cerebral infarction) 09/12/2014  . Type 2 diabetes mellitus with peripheral neuropathy 09/12/2014    Mariah Milling, OTR/L 03/16/2015, 3:23 PM  [facility] [address] [place], [place], [address] Phone: [phone]   Fax:  [phone]

## 2015-03-20 ENCOUNTER — Ambulatory Visit (HOSPITAL_BASED_OUTPATIENT_CLINIC_OR_DEPARTMENT_OTHER): Payer: 59 | Admitting: Physical Medicine & Rehabilitation

## 2015-03-20 ENCOUNTER — Encounter: Payer: 59 | Attending: Physical Medicine & Rehabilitation

## 2015-03-20 ENCOUNTER — Encounter: Payer: Self-pay | Admitting: Occupational Therapy

## 2015-03-20 ENCOUNTER — Encounter: Payer: Self-pay | Admitting: Physical Medicine & Rehabilitation

## 2015-03-20 ENCOUNTER — Ambulatory Visit: Payer: 59 | Admitting: Physical Therapy

## 2015-03-20 ENCOUNTER — Ambulatory Visit: Payer: 59 | Admitting: Occupational Therapy

## 2015-03-20 VITALS — BP 116/64 | HR 78 | Resp 14

## 2015-03-20 DIAGNOSIS — G8110 Spastic hemiplegia affecting unspecified side: Secondary | ICD-10-CM | POA: Diagnosis not present

## 2015-03-20 DIAGNOSIS — E1140 Type 2 diabetes mellitus with diabetic neuropathy, unspecified: Secondary | ICD-10-CM

## 2015-03-20 DIAGNOSIS — R201 Hypoesthesia of skin: Secondary | ICD-10-CM

## 2015-03-20 DIAGNOSIS — I63511 Cerebral infarction due to unspecified occlusion or stenosis of right middle cerebral artery: Secondary | ICD-10-CM

## 2015-03-20 DIAGNOSIS — R414 Neurologic neglect syndrome: Secondary | ICD-10-CM | POA: Diagnosis not present

## 2015-03-20 DIAGNOSIS — Z7409 Other reduced mobility: Secondary | ICD-10-CM

## 2015-03-20 DIAGNOSIS — G8114 Spastic hemiplegia affecting left nondominant side: Secondary | ICD-10-CM

## 2015-03-20 DIAGNOSIS — G8190 Hemiplegia, unspecified affecting unspecified side: Secondary | ICD-10-CM | POA: Diagnosis not present

## 2015-03-20 DIAGNOSIS — E1142 Type 2 diabetes mellitus with diabetic polyneuropathy: Secondary | ICD-10-CM

## 2015-03-20 DIAGNOSIS — G8194 Hemiplegia, unspecified affecting left nondominant side: Secondary | ICD-10-CM

## 2015-03-20 DIAGNOSIS — R269 Unspecified abnormalities of gait and mobility: Secondary | ICD-10-CM | POA: Diagnosis not present

## 2015-03-20 DIAGNOSIS — R4189 Other symptoms and signs involving cognitive functions and awareness: Secondary | ICD-10-CM

## 2015-03-20 NOTE — Progress Notes (Signed)
Subjective:    Patient ID: Brian Hall, male    DOB: [birth year], 59 y.o.   MRN: [id number]  HPI  Pain Inventory Average Pain 3 Pain Right Now 5 My pain is intermittent and aching  In the last 24 hours, has pain interfered with the following? General activity 1 Relation with others 0 Enjoyment of life 1 What TIME of day is your pain at its worst? daytime Sleep (in general) Fair  Pain is worse with: some activites Pain improves with: rest and medication Relief from Meds: 9  Mobility walk with assistance use a cane how many minutes can you walk? 3-5 ability to climb steps?  no do you drive?  no  Function disabled: date disabled 09/2014  Neuro/Psych trouble walking depression anxiety  Prior Studies Any changes since last visit?  no  Physicians involved in your care Any changes since last visit?  no   Family History  Problem Relation Age of Onset  . Hypertension Mother   . Hypertension Father   . Stroke Father   . Stroke Sister    History   Social History  . Marital Status: Legally Separated    Spouse Name: N/A  . Number of Children: 1  . Years of Education: 11   Occupational History  . truck driver, retired    Social History Main Topics  . Smoking status: Never Smoker   . Smokeless tobacco: Not on file  . Alcohol Use: No  . Drug Use: No  . Sexual Activity: Not on file   Other Topics Concern  . None   Social History Narrative   Married, 1 daughter   Right handed   Caffeine use - rare tea      Past Surgical History  Procedure Laterality Date  . Cardiac catheterization    . Neck dissection Right 2011    s/p resection of throat cancer with multiple nodes removed   Past Medical History  Diagnosis Date  . Hypertension   . Diabetes mellitus   . Obesity   . CHF (congestive heart failure)   . SOB (shortness of breath)   . Hyperkalemia   . Renal insufficiency   . Cardiogenic shock   . Cardiac LV ejection fraction 10-20%   .  Hyperlipemia   . Hypothyroidism   . Throat cancer 2011    s/p neck dissection, radiation, chemo  . Stroke   . Blurred vision   . Joint pain   . Joint swelling    BP 116/64 mmHg  Pulse 78  Resp 14  SpO2 98%  Opioid Risk Score:   Fall Risk Score: Moderate Fall Risk (6-13 points)`1  Depression screen PHQ 2/9  Depression screen [facility] 2/9 12/06/2014 10/27/2014  Decreased Interest 2 0  Down, Depressed, Hopeless 0 0  PHQ - 2 Score 2 0  Altered sleeping 0 -  Tired, decreased energy 0 -  Change in appetite 0 -  Feeling bad or failure about yourself  1 -  Trouble concentrating 0 -  Moving slowly or fidgety/restless 2 -  Suicidal thoughts 0 -  PHQ-9 Score 5 -     Review of Systems  Constitutional:       High blood sugar - 115 this AM  HENT: Negative.   Eyes: Negative.   Respiratory: Negative.   Cardiovascular: Negative.   Gastrointestinal: Negative.   Endocrine: Negative.   Genitourinary: Negative.   Musculoskeletal: Positive for myalgias and arthralgias.       Left hand and  foot pain, sciatic pain  Skin: Negative.   Allergic/Immunologic: Negative.   Neurological:       Trouble walking  Hematological: Negative.   Psychiatric/Behavioral: Positive for dysphoric mood. The patient is nervous/anxious.        Objective:   Physical Exam        Assessment & Plan:

## 2015-03-20 NOTE — Therapy (Signed)
[facility] [address] [place], [place], [address] Phone: [phone]   Fax:  [phone]  Occupational Therapy Treatment  Patient Details  Name: Brian Hall MRN: [id number] Date of Birth: 1955-11-28 Referring Provider:  Claretta Fraise, MD  Encounter Date: 03/20/2015      OT End of Session - 03/20/15 1723    Visit Number 26   Number of Visits 32   Date for OT Re-Evaluation 04/11/15   Authorization Type [employer]   OT Start Time 1315   OT Stop Time 1359   OT Time Calculation (min) 44 min   Activity Tolerance Patient tolerated treatment well      Past Medical History  Diagnosis Date  . Hypertension   . Diabetes mellitus   . Obesity   . CHF (congestive heart failure)   . SOB (shortness of breath)   . Hyperkalemia   . Renal insufficiency   . Cardiogenic shock   . Cardiac LV ejection fraction 10-20%   . Hyperlipemia   . Hypothyroidism   . Throat cancer 2011    s/p neck dissection, radiation, chemo  . Stroke   . Blurred vision   . Joint pain   . Joint swelling     Past Surgical History  Procedure Laterality Date  . Cardiac catheterization    . Neck dissection Right 2011    s/p resection of throat cancer with multiple nodes removed    There were no vitals filed for this visit.  Visit Diagnosis:  Left spastic hemiplegia  Hemiplegia affecting left nondominant side  Impaired cognition  Impaired sensation  Impaired functional mobility and activity tolerance      Subjective Assessment - 03/20/15 1330    Subjective  I hope my arm keeps getting better   Patient is accompained by: Family member   Pertinent History see epic snapshot   Patient Stated Goals Get back to doing some of the things I used to do.   Currently in Pain? Yes   Pain Score 3    Pain Location Ankle   Pain Orientation Left   Pain Descriptors / Indicators Aching   Pain Type Chronic pain   Pain Onset More than a month ago    Pain Frequency Constant   Aggravating Factors  walking, wearing brace too long   Pain Relieving Factors taking brace off, resting, stretching   Multiple Pain Sites No                      OT Treatments/Exercises (OP) - 03/20/15 0001    Neurological Re-education Exercises   Other Exercises 1 Neuro re ed to address low and mid bilateral reach in sitting and standing. Pt needs mod vc's and min - mod facilitation but is able to complete task with rounded objects.  Pt continues to need practice and feedback for grading, timing and direction of movement for UE functional use. Pt improving with both low  to mid bilateral reach. Also addressed unilateral reach, place and hold in both sitting and standing.                   OT Short Term Goals - 03/20/15 1719    OT SHORT TERM GOAL #1   Title Pt and wife will be mod I with HEP - 01/18/2015.  Goal dates adjusted as pt will not return to clinic prior to 12/21/14   Status Achieved   OT SHORT TERM GOAL #2   Title  Pt will require min a for LB dressing   Status Achieved   OT SHORT TERM GOAL #3   Title Pt will demonstrate ability to use LUE as gross assist during basic self care 50% of the time.   Status Partially Met   OT SHORT TERM GOAL #4   Title Pt will need no more than min vc's to compensate for L neglect during functional mobility tasks   Status Achieved   OT SHORT TERM GOAL #5   Title Pt will demonstrate ability to attend to familiar functional task for 15 minute with no more than one vc.   Status Achieved   OT SHORT TERM GOAL #6   Title Pt will require no more than min assist for toilet transfers   Status Achieved   OT SHORT TERM GOAL #7   Title Pt and wife will be mod I with upgraded HEP prn - 03/14/2015   Status Achieved   OT SHORT TERM GOAL #8   Title Pt will be able to use LUE as gross assist 75% of the time for basic ADL tasks with min cues   Status Achieved   OT SHORT TERM GOAL  #9   TITLE Pt will require  supervision for tub bench transfers    Status Deferred  pt is now stepping over ledge and into shower wtih min with wife to sit on seat   OT SHORT TERM GOAL  #10   TITLE Pt will require min a for simple snack prep at ambulatory level   Status Achieved           OT Long Term Goals - 03/20/15 1719    OT LONG TERM GOAL #1   Title Pt and wife will be mod I with upgraded HEP - 02/15/2015. Goal date adjusted as pt will not return to clinic prior to 12/21/2014   Status Achieved   OT LONG TERM GOAL #2   Title Pt will demonstrate ability to use LUE as gross assist 100% of the time in basic self care tasks   Status Not Met   OT LONG TERM GOAL #3   Title Pt will demonstrate ability to hold cylindrical object in L hand.    Status Achieved   OT LONG TERM GOAL #4   Title Pt will verbalize understanding of AE for buttoning, zipping and cutting   Status Achieved   OT LONG TERM GOAL #5   Title Pt will be supervision for toilet transfers   Status Achieved   OT LONG TERM GOAL #6   Title Pt will be min a for shower transfers   Status Achieved   OT LONG TERM GOAL #7   Title Pt will  be supervision for simple hot meal prep   Status Deferred   OT LONG TERM GOAL #8   Title Pt will demonstrate the ability to use LUE as gross a for basic ADL's 100% of the time - 04/11/2015   Status On-going   OT LONG TERM GOAL  #9   Baseline Pt will be mod I for toilet transfers   Status On-going   OT LONG TERM GOAL  #10   TITLE Pt will be supervision for simple hot meal prep   Status On-going   OT LONG TERM GOAL  #11   TITLE Pt will demonstrate ability for blilateral mid reach for functional tasks   Status On-going               Plan - 03/20/15 1720  Clinical Impression Statement Pt continues to make slow but steady progress toward LTG's. Pt and wife remain very motivated. Pt benefits from significant repetition due to apraxia, sensory and cogntive deficits that impact functional use. Pt and wife look  for ways to actively incoporate LUE into function.   Pt will benefit from skilled therapeutic intervention in order to improve on the following deficits (Retired) Decreased balance;Decreased coordination;Decreased cognition;Decreased knowledge of use of DME;Decreased mobility;Decreased range of motion;Decreased safety awareness;Difficulty walking;Decreased strength;Increased edema;Increased muscle spasms;Impaired UE functional use;Impaired tone;Impaired sensation;Impaired vision/preception;Pain   Rehab Potential Good   Clinical Impairments Affecting Rehab Potential Pt will benefit from skilled OT to address deficits outlined in eval to maximize independence.   OT Frequency 2x / week   OT Duration 8 weeks   OT Treatment/Interventions Self-care/ADL training;Cryotherapy;Moist Heat;Electrical Stimulation;Fluidtherapy;DME and/or AE instruction;Energy conservation;Neuromuscular education;Therapeutic exercise;Functional Mobility Training;Manual Therapy;Passive range of motion;Splinting;Therapeutic activities;Balance training;Patient/family education;Cognitive remediation/compensation   Plan continue NMR LUE, functional use of LUE, balance, postural control/alignment and safety   Consulted and Agree with Plan of Care Patient;Family member/caregiver   Family Member Consulted wife        Problem List Patient Active Problem List   Diagnosis Date Noted  . Spastic hemiparesis affecting nondominant side 11/08/2014  . Hypothyroidism 10/27/2014  . Left-sided neglect 09/23/2014  . Acute ischemic right MCA stroke 09/16/2014  . Left hemiparesis   . CVA (cerebral vascular accident)   . Right carotid artery occlusion   . Acute right MCA stroke   . Essential hypertension 09/12/2014  . CVA (cerebral infarction) 09/12/2014  . Type 2 diabetes mellitus with peripheral neuropathy 09/12/2014    Quay Burow, OTR/L 03/20/2015, 5:24 PM  [facility] [address] [place], [place], [address] Phone: [phone]   Fax:  [phone]

## 2015-03-20 NOTE — Patient Instructions (Signed)
Tibial nerve block with phenol today. This medication  full effect will be at about one week Duration of the effect is 3-6 months Side effects of medication may include right heel numbness or burning. Call if you have burning pain so we can recommend any medication for that.

## 2015-03-20 NOTE — Therapy (Signed)
[facility] [address] [place], [place], [address] Phone: [phone]   Fax:  [phone]  Physical Therapy Treatment  Patient Details  Name: Brian Hall MRN: [id number] Date of Birth: 1956/02/07 Referring Provider:  Claretta Fraise, MD  Encounter Date: 03/20/2015      PT End of Session - 03/20/15 1436    Visit Number 25   Number of Visits 33   Date for PT Re-Evaluation 04/08/15   PT Start Time 1400   PT Stop Time 7654  Session ended early due to MD appt   PT Time Calculation (min) 30 min   Equipment Utilized During Treatment Gait belt   Activity Tolerance Patient tolerated treatment well   Behavior During Therapy [facility] for tasks assessed/performed      Past Medical History  Diagnosis Date  . Hypertension   . Diabetes mellitus   . Obesity   . CHF (congestive heart failure)   . SOB (shortness of breath)   . Hyperkalemia   . Renal insufficiency   . Cardiogenic shock   . Cardiac LV ejection fraction 10-20%   . Hyperlipemia   . Hypothyroidism   . Throat cancer 2011    s/p neck dissection, radiation, chemo  . Stroke   . Blurred vision   . Joint pain   . Joint swelling     Past Surgical History  Procedure Laterality Date  . Cardiac catheterization    . Neck dissection Right 2011    s/p resection of throat cancer with multiple nodes removed    There were no vitals filed for this visit.  Visit Diagnosis:  Left spastic hemiplegia  Abnormality of gait      Subjective Assessment - 03/20/15 1433    Subjective Reports "same old pain" in L ankle. No falls. Pt with many questions about L tibial nerve block scheduled today.   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  He has a motorcycle and would like to ride it again.    Currently in Pain? Yes   Pain Score 3    Pain Location Ankle   Pain Orientation Left   Pain Descriptors / Indicators Aching   Pain Type Chronic pain   Pain Onset More than a month ago   Pain Frequency Constant   Aggravating Factors  walking, wearing brace too long   Pain Relieving Factors taking brace off, resting, stretching   Multiple Pain Sites No            [facility] PT Assessment - 03/20/15 0001    Tone   Assessment Location Left Lower Extremity   LLE Tone   LLE Tone Moderate  L forefoot supination; L ankle clonus x4 beats         Treatment   Neuro Re-ed: - Supine, hook lying with LLE off EOM (L hip extension/knee flexion), cued pt to utilize L gluteus medius/maximus to lift L hip from mat. Activity focused on selective control of L hip extension/knee flexion, decreasing L hip retraction during standing/ambulation. Pt performed x10 then x20 reps with cueing for technique.             [facility] PT Treatment/Exercise - 03/20/15 0001    Transfers   Transfers Sit to Stand;Stand to Sit   Sit to Stand 6: Modified independent (Device/Increase time)   Ambulation/Gait  Ambulation/Gait Yes   Ambulation/Gait Assistance 5: Supervision;4: Min guard   Ambulation Distance (Feet) 120 Feet   Assistive device Small based quad cane   Gait Pattern Step-through pattern;Decreased hip/knee flexion - left;Decreased dorsiflexion - left;Decreased weight shift to left;Lateral trunk lean to right;Trunk rotated posteriorly on left  LLE adduction throughout gait cycle   Ramp 4: Min assist   Ramp Details (indicate cue type and reason) wiht SBQC   Curb 4: Min assist  with [facility]   Curb Details (indicate cue type and reason) cueing for sequencing during initial trial; pt with effective return demonstration with subtle  cueing during subsequent trial  limited by onset of LLE extensor tone (ascent>descent)                  PT Short Term Goals - 03/07/15 1339    PT SHORT TERM GOAL #1   Title Pt will perform HEP with  supervision of family, for improved strength and balance. (Target 01/10/15)   Time 4   Period Weeks   Status Achieved   PT SHORT TERM GOAL #2   Title Pt will improve Timed UP and Go score to less than or equal to 35 seconds for decreased fall risk.   Baseline eval-46.78 sec   Time 4   Period Weeks   Status Not Met  46.81 with quad cane and close supervision and occasional CGA   PT SHORT TERM GOAL #3   Title Berg Balance score to be assessed, with pt scoring at least 5 additional points to decrease fall risk.   Time 4   Period Weeks   Status Achieved  Scored 33 today   PT SHORT TERM GOAL #4   Title Pt will perform at least 6 of 10 reps of sit<>stand transfers no UE support, with equal weightbearing through bilateral lower extremities, independently.   Time 4   Period Weeks   Status Achieved   PT SHORT TERM GOAL #5   Title Pt will improve TUG time from 29.69 sec to 25 seconds for decreased fall risk. Target date: 03/08/15.   Time 4   Period Weeks   Status On-going   PT SHORT TERM GOAL #6   Title Pt will ambulate x200' over level surfaces with SBQC and supervision with no overt LOB. Target date: 03/08/15.   Baseline Achieved 03/07/15.   Time 4   Period Weeks   Status Achieved           PT Long Term Goals - 03/14/15 1020    PT LONG TERM GOAL #1   Title Pt will verbalize understanding of fall prevention within home environment. Target 02/10/15   Time 8   Period Weeks   Status Achieved   PT LONG TERM GOAL #2   Title Pt will improve TUG to less than or equal to 25 seconds for decreased fall risk.   Baseline 7/12: 31.87 sec with SBQC   Time 8   Status Not Met  Unmet goal will continue as short-term goal   PT LONG TERM GOAL #3   Title Pt will improve gait velocity to at least 1 ft/sec for improved gait efficiency and safety.   Time 8   Period Weeks   Status Achieved   PT LONG TERM GOAL #4   Title Pt will ambulate at least 50-100 ft using hemiwalker, household distances, with  supervision.   Time 8   Period Weeks   Status Achieved   PT LONG TERM GOAL #5  Title Pt will ambulate 150' over outdoor, unlevel surfaces with LRAD and supervision to indicate increased stability/independence with community mobility. Target date: 04/05/15.   Time 8   Period Weeks   Status On-going   PT LONG TERM GOAL #6   Title Pt will negotiate 4 stairs with single rail and supervision to increase pt independence with community mobility. Target date: 04/05/15.   Time 8   Period Weeks   Status On-going   PT LONG TERM GOAL #7   Title Pt will increase gait speed from 1.02 ft/sec to 1.62 ft/sec to indicate significant improvement in efficiency of ambulation. Target date: 04/05/15.   Time 8   Period Weeks   Status On-going               Plan - 03/20/15 1437    Clinical Impression Statement Session focused on traversing community obstacles, increasing selective control in LLE (emphasis on hip extension and knee flexion). Assessed current ROM and strength in L ankle, as pt will be receiving L tibial nerve block today. Continue per POC.   Pt will benefit from skilled therapeutic intervention in order to improve on the following deficits Abnormal gait;Decreased activity tolerance;Decreased balance;Decreased mobility;Decreased range of motion;Decreased safety awareness;Difficulty walking;Decreased strength;Impaired tone;Decreased knowledge of use of DME;Decreased endurance;Decreased coordination;Impaired flexibility;Impaired perceived functional ability;Postural dysfunction;Impaired sensation;Impaired UE functional use;Impaired vision/preception;Decreased cognition;Pain   Rehab Potential Good   PT Frequency 2x / week   PT Duration 8 weeks   PT Treatment/Interventions ADLs/Self Care Home Management;Therapeutic activities;Functional mobility training;Stair training;Gait training;Therapeutic exercise;Balance training;Neuromuscular re-education;Patient/family education;DME Instruction;Cognitive  remediation;Orthotic Fit/Training;Manual techniques;Passive range of motion;Visual/perceptual remediation/compensation   PT Next Visit Plan  Anterior weight shift during L single limb stance without L hip retraction. Continue to address community mobility (curb step negotiation) and stairs.   Recommended Other Services L tibial nerve block scheduled for today at 3:00 pm.   Consulted and Agree with Plan of Care Patient;Family member/caregiver   Family Member Consulted wife, [name]        Problem List Patient Active Problem List   Diagnosis Date Noted  . Spastic hemiparesis affecting nondominant side 11/08/2014  . Hypothyroidism 10/27/2014  . Left-sided neglect 09/23/2014  . Acute ischemic right MCA stroke 09/16/2014  . Left hemiparesis   . CVA (cerebral vascular accident)   . Right carotid artery occlusion   . Acute right MCA stroke   . Essential hypertension 09/12/2014  . CVA (cerebral infarction) 09/12/2014  . Type 2 diabetes mellitus with peripheral neuropathy 09/12/2014    Billie Ruddy, PT, DPT [facility] [address] [place], [place], [address] Phone: [phone]   Fax:  [phone] 03/20/2015, 7:00 PM

## 2015-03-21 ENCOUNTER — Encounter: Payer: Self-pay | Admitting: Occupational Therapy

## 2015-03-21 ENCOUNTER — Ambulatory Visit: Payer: 59 | Admitting: Occupational Therapy

## 2015-03-21 ENCOUNTER — Ambulatory Visit: Payer: 59 | Admitting: Physical Therapy

## 2015-03-21 DIAGNOSIS — R269 Unspecified abnormalities of gait and mobility: Secondary | ICD-10-CM | POA: Diagnosis not present

## 2015-03-21 DIAGNOSIS — R4189 Other symptoms and signs involving cognitive functions and awareness: Secondary | ICD-10-CM

## 2015-03-21 DIAGNOSIS — R201 Hypoesthesia of skin: Secondary | ICD-10-CM

## 2015-03-21 DIAGNOSIS — G8194 Hemiplegia, unspecified affecting left nondominant side: Secondary | ICD-10-CM

## 2015-03-21 DIAGNOSIS — G8114 Spastic hemiplegia affecting left nondominant side: Secondary | ICD-10-CM

## 2015-03-21 NOTE — Therapy (Signed)
[facility] [address] [place], [place], [address] Phone: [phone]   Fax:  [phone]  Occupational Therapy Treatment  Patient Details  Name: Brian Hall MRN: [id number] Date of Birth: 09-May-1956 Referring Provider:  Claretta Fraise, MD  Encounter Date: 03/21/2015      OT End of Session - 03/21/15 1534    Visit Number 27   Number of Visits 32   Date for OT Re-Evaluation 04/11/15   Authorization Type [employer]   OT Start Time 1400   OT Stop Time 1445   OT Time Calculation (min) 45 min   Activity Tolerance Patient tolerated treatment well   Behavior During Therapy [facility] for tasks assessed/performed      Past Medical History  Diagnosis Date  . Hypertension   . Diabetes mellitus   . Obesity   . CHF (congestive heart failure)   . SOB (shortness of breath)   . Hyperkalemia   . Renal insufficiency   . Cardiogenic shock   . Cardiac LV ejection fraction 10-20%   . Hyperlipemia   . Hypothyroidism   . Throat cancer 2011    s/p neck dissection, radiation, chemo  . Stroke   . Blurred vision   . Joint pain   . Joint swelling     Past Surgical History  Procedure Laterality Date  . Cardiac catheterization    . Neck dissection Right 2011    s/p resection of throat cancer with multiple nodes removed    There were no vitals filed for this visit.  Visit Diagnosis:  Hemiplegia affecting left nondominant side  Impaired sensation  Impaired cognition      Subjective Assessment - 03/21/15 1454    Subjective  I gotta cook a meal.   Patient is accompained by: Family member   Pertinent History see epic snapshot   Patient Stated Goals Get back to doing some of the things I used to do.                      OT Treatments/Exercises (OP) - 03/21/15 0001    Neurological Re-education Exercises   Other Exercises 1 Neuromuscular reeducation to address mid level reach in shoulder flexion and  abduction. Facilitation to guide lower arm to prevent falling into synergistic pattern, and incorporating trunk into task.  Patient needs physical and verbal cueing to not overuse neck extension to aide with lifting arm.  Guided reaching pattern up to and slightly beyond 90 degrees in sitting and in standing.   Other Exercises 2 Patient with recent phenol injection in left lower leg to decrease spasticity in foot and ankle.  Patient with significant improvement in his ability to accept weight onto aligned left foot today.  Worked on actively extending elbow and left hip to allow for weight transfer to left for stepping forward and back with right foot.                  OT Education - 03/21/15 1533    Education provided Yes   Education Details Reinforced reaching pattern with proximal shoulder depression to allow hand to reach upward.   Person(s) Educated Patient;Spouse   Methods Explanation;Demonstration   Comprehension Verbalized understanding;Verbal cues required;Tactile cues required          OT Short Term Goals - 03/20/15 1719    OT SHORT TERM GOAL #1   Title Pt and wife will be mod I with HEP - 01/18/2015.  Goal dates adjusted  as pt will not return to clinic prior to 12/21/14   Status Achieved   OT SHORT TERM GOAL #2   Title Pt will require min a for LB dressing   Status Achieved   OT SHORT TERM GOAL #3   Title Pt will demonstrate ability to use LUE as gross assist during basic self care 50% of the time.   Status Partially Met   OT SHORT TERM GOAL #4   Title Pt will need no more than min vc's to compensate for L neglect during functional mobility tasks   Status Achieved   OT SHORT TERM GOAL #5   Title Pt will demonstrate ability to attend to familiar functional task for 15 minute with no more than one vc.   Status Achieved   OT SHORT TERM GOAL #6   Title Pt will require no more than min assist for toilet transfers   Status Achieved   OT SHORT TERM GOAL #7   Title Pt and  wife will be mod I with upgraded HEP prn - 03/14/2015   Status Achieved   OT SHORT TERM GOAL #8   Title Pt will be able to use LUE as gross assist 75% of the time for basic ADL tasks with min cues   Status Achieved   OT SHORT TERM GOAL  #9   TITLE Pt will require supervision for tub bench transfers    Status Deferred  pt is now stepping over ledge and into shower wtih min with wife to sit on seat   OT SHORT TERM GOAL  #10   TITLE Pt will require min a for simple snack prep at ambulatory level   Status Achieved           OT Long Term Goals - 03/20/15 1719    OT LONG TERM GOAL #1   Title Pt and wife will be mod I with upgraded HEP - 02/15/2015. Goal date adjusted as pt will not return to clinic prior to 12/21/2014   Status Achieved   OT LONG TERM GOAL #2   Title Pt will demonstrate ability to use LUE as gross assist 100% of the time in basic self care tasks   Status Not Met   OT LONG TERM GOAL #3   Title Pt will demonstrate ability to hold cylindrical object in L hand.    Status Achieved   OT LONG TERM GOAL #4   Title Pt will verbalize understanding of AE for buttoning, zipping and cutting   Status Achieved   OT LONG TERM GOAL #5   Title Pt will be supervision for toilet transfers   Status Achieved   OT LONG TERM GOAL #6   Title Pt will be min a for shower transfers   Status Achieved   OT LONG TERM GOAL #7   Title Pt will  be supervision for simple hot meal prep   Status Deferred   OT LONG TERM GOAL #8   Title Pt will demonstrate the ability to use LUE as gross a for basic ADL's 100% of the time - 04/11/2015   Status On-going   OT LONG TERM GOAL  #9   Baseline Pt will be mod I for toilet transfers   Status On-going   OT LONG TERM GOAL  #10   TITLE Pt will be supervision for simple hot meal prep   Status On-going   OT LONG TERM GOAL  #11   TITLE Pt will demonstrate ability for blilateral mid reach for  functional tasks   Status On-going               Plan -  03/21/15 1534    Clinical Impression Statement Patient showing steady improvement with functional mobility and improved movement / control in left upper extremity.  Patient with significant sensory, perceptual, and cognitive deficits which impact his ability to utilize left arm functionally, although patient and wife very motivated to incorporate into functional tasks at home.     Pt will benefit from skilled therapeutic intervention in order to improve on the following deficits (Retired) Decreased balance;Decreased coordination;Decreased cognition;Decreased knowledge of use of DME;Decreased mobility;Decreased range of motion;Decreased safety awareness;Difficulty walking;Decreased strength;Increased edema;Increased muscle spasms;Impaired UE functional use;Impaired tone;Impaired sensation;Impaired vision/preception;Pain   Rehab Potential Good   Clinical Impairments Affecting Rehab Potential Pt will benefit from skilled OT to address deficits outlined in eval to maximize independence.   OT Frequency 2x / week   OT Duration 8 weeks   OT Treatment/Interventions Self-care/ADL training;Cryotherapy;Moist Heat;Electrical Stimulation;Fluidtherapy;DME and/or AE instruction;Energy conservation;Neuromuscular education;Therapeutic exercise;Functional Mobility Training;Manual Therapy;Passive range of motion;Splinting;Therapeutic activities;Balance training;Patient/family education;Cognitive remediation/compensation   Plan NMR left UE, fUNCTIONAL USE OF LEFT ue, balance, postural control, safety, functional mobility   Consulted and Agree with Plan of Care Patient;Family member/caregiver   Family Member Consulted wife        Problem List Patient Active Problem List   Diagnosis Date Noted  . Spastic hemiparesis affecting nondominant side 11/08/2014  . Hypothyroidism 10/27/2014  . Left-sided neglect 09/23/2014  . Acute ischemic right MCA stroke 09/16/2014  . Left hemiparesis   . CVA (cerebral vascular  accident)   . Right carotid artery occlusion   . Acute right MCA stroke   . Essential hypertension 09/12/2014  . CVA (cerebral infarction) 09/12/2014  . Type 2 diabetes mellitus with peripheral neuropathy 09/12/2014    Mariah Milling, OTR/L 03/21/2015, 3:38 PM  [facility] [address] [place], [place], [address] Phone: [phone]   Fax:  [phone]

## 2015-03-21 NOTE — Therapy (Signed)
[facility] [address] [place], [place], [address] Phone: [phone]   Fax:  [phone]  Physical Therapy Treatment  Patient Details  Name: Brian Hall MRN: [id number] Date of Birth: 04-06-1956 Referring Provider:  Claretta Fraise, MD  Encounter Date: 03/21/2015      PT End of Session - 03/21/15 1703    Visit Number 26   Number of Visits 33   Date for PT Re-Evaluation 04/08/15   PT Start Time 1315   PT Stop Time 1400   PT Time Calculation (min) 45 min   Equipment Utilized During Treatment Gait belt   Activity Tolerance Patient tolerated treatment well   Behavior During Therapy [facility] for tasks assessed/performed      Past Medical History  Diagnosis Date  . Hypertension   . Diabetes mellitus   . Obesity   . CHF (congestive heart failure)   . SOB (shortness of breath)   . Hyperkalemia   . Renal insufficiency   . Cardiogenic shock   . Cardiac LV ejection fraction 10-20%   . Hyperlipemia   . Hypothyroidism   . Throat cancer 2011    s/p neck dissection, radiation, chemo  . Stroke   . Blurred vision   . Joint pain   . Joint swelling     Past Surgical History  Procedure Laterality Date  . Cardiac catheterization    . Neck dissection Right 2011    s/p resection of throat cancer with multiple nodes removed    There were no vitals filed for this visit.  Visit Diagnosis:  Left spastic hemiplegia  Abnormality of gait      Subjective Assessment - 03/21/15 1659    Subjective Since having L tibial nerve block yesterday, wife noted increased ease of donning shoes (with total A of wife) today. Pt reporting no significant change in L ankle pain thus far, but states, "The doctor said it takes about a week to really work."   Patient is accompained by: Family member   Pertinent History CVA with L hemiparesis 09/2014   Patient Stated Goals Pt's goal is to get back to walking and using L hand again.  He has  a motorcycle and would like to ride it again.    Currently in Pain? Yes   Pain Score 2    Pain Location Ankle   Pain Orientation Left   Pain Descriptors / Indicators Aching   Pain Type Chronic pain   Pain Onset More than a month ago   Aggravating Factors  walking, wearing brace too long   Pain Relieving Factors taking off brace, resting, stretching                         [facility] Adult PT Treatment/Exercise - 03/21/15 1701    Transfers   Transfers Sit to Stand;Stand to Sit   Sit to Stand 6: Modified independent (Device/Increase time)   Ambulation/Gait   Ambulation/Gait Yes   Ambulation/Gait Assistance 5: Supervision;4: Min guard   Ambulation Distance (Feet) 200 Feet   Assistive device Small based quad cane   Gait Pattern Step-through pattern;Decreased hip/knee flexion - left;Decreased dorsiflexion - left;Decreased weight shift to left;Lateral trunk lean to right;Trunk rotated posteriorly on left;Left circumduction  LLE adduction/extension slightly less prominent   Ambulation Surface Level;Indoor       Treatment   Neuro Re-ed: The following was performed with NMES on L peroneal muscle group to promote functional L ankle eversion,  dorsiflexion.  - Supine LLE D2 flexion/extension x10 reps rhythmic initiation, x10reps slow reversal hold during D2 flexion to emphasize initiation of LLE advancement via L hip flexio (as opposed to circumduction)2, manually facilitated L ankle dorsiflexion during D2 flexion. Verbal cueing emphasized LLE ABD during D2 extension to address L hip adduction/extensor tone during LLE advancement. - Blocked practice of sit <> stand from mat table with 4" step then 2" step under RLE to increase LLE weightbearing during transitional movements. Noted no L ankle supinator tone, no L great toe flexion/toe clawing during transfers. Mirror anterior to pt for visual feedback of symmetrical weightbearing.  Gait Training: - Static standing without shoes 30-60  seconds x3 trials with min guard-min A for stability/balance, 2" step under RLE for increased LLE weightbearing. Mirror utilized for Warden/ranger. Progressed to standing, RLE advancement (forward/retro) with multimodal cueing for anterior/lateral weight shift to L side while maintaining LLE stance stability and while preventing L hip adduction. - Standing with yellow Tband proximal to L knee with RUE support at [facility], pt performed L hip/knee flexion x10 reps to address LLE circumduction during gait. Required CGA to Min A for stability/balance.           PT Short Term Goals - 03/07/15 1339    PT SHORT TERM GOAL #1   Title Pt will perform HEP with supervision of family, for improved strength and balance. (Target 01/10/15)   Time 4   Period Weeks   Status Achieved   PT SHORT TERM GOAL #2   Title Pt will improve Timed UP and Go score to less than or equal to 35 seconds for decreased fall risk.   Baseline eval-46.78 sec   Time 4   Period Weeks   Status Not Met  46.81 with quad cane and close supervision and occasional CGA   PT SHORT TERM GOAL #3   Title Berg Balance score to be assessed, with pt scoring at least 5 additional points to decrease fall risk.   Time 4   Period Weeks   Status Achieved  Scored 33 today   PT SHORT TERM GOAL #4   Title Pt will perform at least 6 of 10 reps of sit<>stand transfers no UE support, with equal weightbearing through bilateral lower extremities, independently.   Time 4   Period Weeks   Status Achieved   PT SHORT TERM GOAL #5   Title Pt will improve TUG time from 29.69 sec to 25 seconds for decreased fall risk. Target date: 03/08/15.   Time 4   Period Weeks   Status On-going   PT SHORT TERM GOAL #6   Title Pt will ambulate x200' over level surfaces with SBQC and supervision with no overt LOB. Target date: 03/08/15.   Baseline Achieved 03/07/15.   Time 4   Period Weeks   Status Achieved           PT Long Term Goals - 03/14/15 1020    PT LONG  TERM GOAL #1   Title Pt will verbalize understanding of fall prevention within home environment. Target 02/10/15   Time 8   Period Weeks   Status Achieved   PT LONG TERM GOAL #2   Title Pt will improve TUG to less than or equal to 25 seconds for decreased fall risk.   Baseline 7/12: 31.87 sec with SBQC   Time 8   Status Not Met  Unmet goal will continue as short-term goal   PT LONG TERM GOAL #3  Title Pt will improve gait velocity to at least 1 ft/sec for improved gait efficiency and safety.   Time 8   Period Weeks   Status Achieved   PT LONG TERM GOAL #4   Title Pt will ambulate at least 50-100 ft using hemiwalker, household distances, with supervision.   Time 8   Period Weeks   Status Achieved   PT LONG TERM GOAL #5   Title Pt will ambulate 150' over outdoor, unlevel surfaces with LRAD and supervision to indicate increased stability/independence with community mobility. Target date: 04/05/15.   Time 8   Period Weeks   Status On-going   PT LONG TERM GOAL #6   Title Pt will negotiate 4 stairs with single rail and supervision to increase pt independence with community mobility. Target date: 04/05/15.   Time 8   Period Weeks   Status On-going   PT LONG TERM GOAL #7   Title Pt will increase gait speed from 1.02 ft/sec to 1.62 ft/sec to indicate significant improvement in efficiency of ambulation. Target date: 04/05/15.   Time 8   Period Weeks   Status On-going               Plan - 03/21/15 1704    Clinical Impression Statement Significant improvement in L ankle posture/tone since L tibial nerve block yesterday. Noted no L forefoot supination, minimal L great toe flexion in static standing. Using NMES, able to elicit activation of L peroneal muscle group without being inhibited by tone.  Continue per POC.   Pt will benefit from skilled therapeutic intervention in order to improve on the following deficits Abnormal gait;Decreased activity tolerance;Decreased balance;Decreased  mobility;Decreased range of motion;Decreased safety awareness;Difficulty walking;Decreased strength;Impaired tone;Decreased knowledge of use of DME;Decreased endurance;Decreased coordination;Impaired flexibility;Impaired perceived functional ability;Postural dysfunction;Impaired sensation;Impaired UE functional use;Impaired vision/preception;Decreased cognition;Pain   Rehab Potential Good   PT Frequency 2x / week   PT Duration 8 weeks   PT Treatment/Interventions ADLs/Self Care Home Management;Therapeutic activities;Functional mobility training;Stair training;Gait training;Therapeutic exercise;Balance training;Neuromuscular re-education;Patient/family education;DME Instruction;Cognitive remediation;Orthotic Fit/Training;Manual techniques;Passive range of motion;Visual/perceptual remediation/compensation   PT Next Visit Plan L tibial nerve block on 7/18. Continue to work on functional return of L ankle dorsiflexion, eversion. Continue to address community mobility (ramp, curb step negotiation) and stairs.   Consulted and Agree with Plan of Care Patient;Family member/caregiver   Family Member Consulted wife, [name]        Problem List Patient Active Problem List   Diagnosis Date Noted  . Spastic hemiparesis affecting nondominant side 11/08/2014  . Hypothyroidism 10/27/2014  . Left-sided neglect 09/23/2014  . Acute ischemic right MCA stroke 09/16/2014  . Left hemiparesis   . CVA (cerebral vascular accident)   . Right carotid artery occlusion   . Acute right MCA stroke   . Essential hypertension 09/12/2014  . CVA (cerebral infarction) 09/12/2014  . Type 2 diabetes mellitus with peripheral neuropathy 09/12/2014    Billie Ruddy, PT, DPT [facility] [address] [place], [place], [address] Phone: [phone]   Fax:  [phone] 03/21/2015, 5:11 PM

## 2015-03-27 ENCOUNTER — Ambulatory Visit: Payer: 59 | Admitting: Physical Therapy

## 2015-03-27 ENCOUNTER — Encounter: Payer: 59 | Admitting: Occupational Therapy

## 2015-03-27 DIAGNOSIS — R269 Unspecified abnormalities of gait and mobility: Secondary | ICD-10-CM | POA: Diagnosis not present

## 2015-03-27 DIAGNOSIS — G8114 Spastic hemiplegia affecting left nondominant side: Secondary | ICD-10-CM

## 2015-03-27 NOTE — Therapy (Signed)
[facility] [address] [place], [place], [address] Phone: [phone]   Fax:  [phone]  Physical Therapy Treatment  Patient Details  Name: Brian Hall MRN: [id number] Date of Birth: 11-13-1955 Referring Provider:  Claretta Fraise, MD  Encounter Date: 03/27/2015      PT End of Session - 03/27/15 1410    Visit Number 27   Number of Visits 33   Date for PT Re-Evaluation 04/08/15   PT Start Time 1315   PT Stop Time 1405   PT Time Calculation (min) 50 min   Equipment Utilized During Treatment Gait belt   Activity Tolerance Patient tolerated treatment well   Behavior During Therapy [facility] for tasks assessed/performed      Past Medical History  Diagnosis Date  . Hypertension   . Diabetes mellitus   . Obesity   . CHF (congestive heart failure)   . SOB (shortness of breath)   . Hyperkalemia   . Renal insufficiency   . Cardiogenic shock   . Cardiac LV ejection fraction 10-20%   . Hyperlipemia   . Hypothyroidism   . Throat cancer 2011    s/p neck dissection, radiation, chemo  . Stroke   . Blurred vision   . Joint pain   . Joint swelling     Past Surgical History  Procedure Laterality Date  . Cardiac catheterization    . Neck dissection Right 2011    s/p resection of throat cancer with multiple nodes removed    There were no vitals filed for this visit.  Visit Diagnosis:  Abnormality of gait  Left spastic hemiplegia      Subjective Assessment - 03/27/15 1408    Subjective Pt reporting that L ankle pain "isn't near as bad" since having L tibial nerve block. Pt/wife reporting performing L hip extension exercise daily since last week.   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  He has a motorcycle and would like to ride it again.    Currently in Pain? Yes   Pain  Score 2    Pain Location Ankle   Pain Orientation Left   Pain Descriptors / Indicators Burning   Pain Type Chronic pain   Pain Onset More than a month ago   Pain Frequency Constant   Aggravating Factors  walking, wearing brace too long   Pain Relieving Factors taking off brace, resting, stretching                         [facility] Adult PT Treatment/Exercise - 03/27/15 1414    Transfers   Transfers Sit to Stand;Stand to Sit   Sit to Stand 6: Modified independent (Device/Increase time)   Stand to Sit Details SBQC   Ambulation/Gait   Ambulation/Gait Yes   Ambulation/Gait Assistance 5: Supervision;4: Min assist   Ambulation/Gait Assistance Details Min A to recover from single L toe catch   Ambulation Distance (Feet) 250 Feet   Assistive device Small based quad cane   Gait Pattern Step-through pattern;Decreased hip/knee flexion - left;Decreased dorsiflexion - left;Decreased weight shift to left;Trunk rotated posteriorly on left;Left circumduction;Poor foot clearance - right  L hip retraction less prominent during this session   Ambulation Surface Level;Indoor   Gait Comments Tactile cueing for anterior/lateral weight shift during RLE advancement; noted consistent activatoin  of L gluteus medius/maximus during RLE advacement   Neuro Re-ed    Neuro Re-ed Details  For tone management, performed manual prolonged passive stretch of L gastrocnemius x2 minutes, L soleus x1 minute. Static standing without L shoe with cueing for symmetrical weightbearing for LLE weigthbearing,  L ankle tone reduction. Semi reclined, performed L ankle eversion AAROM with manual assist, cueing focused on decreasing L forefoot supination, which continues to overpower dorsiflexion, eversion  NMES on L peroneals throughout NMR for subtalar eversion                PT Education - 03/27/15 1416    Education provided Yes   Education Details Recommending use of non-elastic shoe laces on L shoe only to  maximize effectiveness of L AFO.   Person(s) Educated Patient;Spouse   Methods Explanation   Comprehension Verbalized understanding          PT Short Term Goals - 03/07/15 1339    PT SHORT TERM GOAL #1   Title Pt will perform HEP with supervision of family, for improved strength and balance. (Target 01/10/15)   Time 4   Period Weeks   Status Achieved   PT SHORT TERM GOAL #2   Title Pt will improve Timed UP and Go score to less than or equal to 35 seconds for decreased fall risk.   Baseline eval-46.78 sec   Time 4   Period Weeks   Status Not Met  46.81 with quad cane and close supervision and occasional CGA   PT SHORT TERM GOAL #3   Title Berg Balance score to be assessed, with pt scoring at least 5 additional points to decrease fall risk.   Time 4   Period Weeks   Status Achieved  Scored 33 today   PT SHORT TERM GOAL #4   Title Pt will perform at least 6 of 10 reps of sit<>stand transfers no UE support, with equal weightbearing through bilateral lower extremities, independently.   Time 4   Period Weeks   Status Achieved   PT SHORT TERM GOAL #5   Title Pt will improve TUG time from 29.69 sec to 25 seconds for decreased fall risk. Target date: 03/08/15.   Time 4   Period Weeks   Status On-going   PT SHORT TERM GOAL #6   Title Pt will ambulate x200' over level surfaces with SBQC and supervision with no overt LOB. Target date: 03/08/15.   Baseline Achieved 03/07/15.   Time 4   Period Weeks   Status Achieved           PT Long Term Goals - 03/14/15 1020    PT LONG TERM GOAL #1   Title Pt will verbalize understanding of fall prevention within home environment. Target 02/10/15   Time 8   Period Weeks   Status Achieved   PT LONG TERM GOAL #2   Title Pt will improve TUG to less than or equal to 25 seconds for decreased fall risk.   Baseline 7/12: 31.87 sec with SBQC   Time 8   Status Not Met  Unmet goal will continue as short-term goal   PT LONG TERM GOAL #3   Title Pt  will improve gait velocity to at least 1 ft/sec for improved gait efficiency and safety.   Time 8   Period Weeks   Status Achieved   PT LONG TERM GOAL #4   Title Pt will ambulate at least 50-100 ft using hemiwalker, household distances, with supervision.  Time 8   Period Weeks   Status Achieved   PT LONG TERM GOAL #5   Title Pt will ambulate 150' over outdoor, unlevel surfaces with LRAD and supervision to indicate increased stability/independence with community mobility. Target date: 04/05/15.   Time 8   Period Weeks   Status On-going   PT LONG TERM GOAL #6   Title Pt will negotiate 4 stairs with single rail and supervision to increase pt independence with community mobility. Target date: 04/05/15.   Time 8   Period Weeks   Status On-going   PT LONG TERM GOAL #7   Title Pt will increase gait speed from 1.02 ft/sec to 1.62 ft/sec to indicate significant improvement in efficiency of ambulation. Target date: 04/05/15.   Time 8   Period Weeks   Status On-going               Plan - 03/27/15 1410    Clinical Impression Statement Focused on eliciting activation of L subtalar eversion (to prevent L forefoot supination, L toe clawing due to tone) with dorsiflexion to address pain and promote functional return of LLE function. Able to elicit isolated L subtalar eversion using NMES on L peroneals only. For the first time, noted consistently activation of L gluteus maximus/medius during RLE advancement during gait training this session. Continue per POC.   Pt will benefit from skilled therapeutic intervention in order to improve on the following deficits Abnormal gait;Decreased activity tolerance;Decreased balance;Decreased mobility;Decreased range of motion;Decreased safety awareness;Difficulty walking;Decreased strength;Impaired tone;Decreased knowledge of use of DME;Decreased endurance;Decreased coordination;Impaired flexibility;Impaired perceived functional ability;Postural  dysfunction;Impaired sensation;Impaired UE functional use;Impaired vision/preception;Decreased cognition;Pain   Rehab Potential Good   PT Frequency 2x / week   PT Duration 8 weeks   PT Treatment/Interventions ADLs/Self Care Home Management;Therapeutic activities;Functional mobility training;Stair training;Gait training;Therapeutic exercise;Balance training;Neuromuscular re-education;Patient/family education;DME Instruction;Cognitive remediation;Orthotic Fit/Training;Manual techniques;Passive range of motion;Visual/perceptual remediation/compensation   PT Next Visit Plan Continue to address community mobility (ramp, curb step negotiation) and stairs. Gait training; functional weight shifting. Continue to address functional return of L ankle dorsiflexion, eversion.   Consulted and Agree with Plan of Care Patient;Family member/caregiver   Family Member Consulted wife, [name]        Problem List Patient Active Problem List   Diagnosis Date Noted  . Spastic hemiparesis affecting nondominant side 11/08/2014  . Hypothyroidism 10/27/2014  . Left-sided neglect 09/23/2014  . Acute ischemic right MCA stroke 09/16/2014  . Left hemiparesis   . CVA (cerebral vascular accident)   . Right carotid artery occlusion   . Acute right MCA stroke   . Essential hypertension 09/12/2014  . CVA (cerebral infarction) 09/12/2014  . Type 2 diabetes mellitus with peripheral neuropathy 09/12/2014    Billie Ruddy, PT, DPT [facility] [address] [place], [place], [address] Phone: [phone]   Fax:  [phone] 03/27/2015, 2:26 PM

## 2015-03-31 ENCOUNTER — Ambulatory Visit: Payer: 59 | Admitting: Occupational Therapy

## 2015-03-31 ENCOUNTER — Encounter: Payer: Self-pay | Admitting: Occupational Therapy

## 2015-03-31 ENCOUNTER — Ambulatory Visit: Payer: 59 | Admitting: Physical Therapy

## 2015-03-31 DIAGNOSIS — R269 Unspecified abnormalities of gait and mobility: Secondary | ICD-10-CM | POA: Diagnosis not present

## 2015-03-31 DIAGNOSIS — R278 Other lack of coordination: Secondary | ICD-10-CM

## 2015-03-31 DIAGNOSIS — G8114 Spastic hemiplegia affecting left nondominant side: Secondary | ICD-10-CM

## 2015-03-31 DIAGNOSIS — R201 Hypoesthesia of skin: Secondary | ICD-10-CM

## 2015-03-31 NOTE — Therapy (Signed)
[facility] [address] [place], [place], [address] Phone: [phone]   Fax:  [phone]  Occupational Therapy Treatment  Patient Details  Name: Brian Hall MRN: [id number] Date of Birth: 1955-09-13 Referring Provider:  Claretta Fraise, MD  Encounter Date: 03/31/2015      OT End of Session - 03/31/15 1606    Visit Number 28   Number of Visits 32   Date for OT Re-Evaluation 04/11/15   Authorization Type [employer]   OT Start Time 1400   OT Stop Time 1455   OT Time Calculation (min) 55 min   Activity Tolerance Patient tolerated treatment well   Behavior During Therapy [facility] for tasks assessed/performed      Past Medical History  Diagnosis Date  . Hypertension   . Diabetes mellitus   . Obesity   . CHF (congestive heart failure)   . SOB (shortness of breath)   . Hyperkalemia   . Renal insufficiency   . Cardiogenic shock   . Cardiac LV ejection fraction 10-20%   . Hyperlipemia   . Hypothyroidism   . Throat cancer 2011    s/p neck dissection, radiation, chemo  . Stroke   . Blurred vision   . Joint pain   . Joint swelling     Past Surgical History  Procedure Laterality Date  . Cardiac catheterization    . Neck dissection Right 2011    s/p resection of throat cancer with multiple nodes removed    There were no vitals filed for this visit.  Visit Diagnosis:  Left spastic hemiplegia  Impaired sensation  Coordination abnormal      Subjective Assessment - 03/31/15 1535    Subjective  I am gonna get there! (Regarding progress with therapy)   Patient is accompained by: Family member   Pertinent History see epic snapshot   Patient Stated Goals Get back to doing some of the things I used to do.                      OT Treatments/Exercises (OP) - 03/31/15 0001    ADLs   Toileting Reviewed options for grab bars in current set up in bathroom.  Patient continues to need assistance with  toilet hygiene per wife.  Patient now with nearly sufficient UE control to bring left hand to grab bar at waist height, and to release hand from grab bar.     Neurological Re-education Exercises   Other Exercises 1 Neuromuscular reeducation to address more balanced muscle activity in left arm.  Patient with wrist discomfort with weight bearing activity, although with gentle traction and alignment, pain resolved.  Patient able to position self into quadruped with only minimal assistance today.  Better alignment available with weight through forearms initially.  Patient with improved ability to flex shoulder with extended elbow in this position.  Worked on trunk rotation and hip range of motion also in transitioning from side sit to quadruped.   Other Exercises 2 Following weight bearing, patient able to isolate finger flexion to close hand and inhibit overriding muscle action in trunk and right upper extremity                OT Education - 03/31/15 1605    Education provided Yes   Education Details reviewed options for support in bathroom, and set up of bathroom to increase independence with shower and toileting   Person(s) Educated Patient;Spouse   Methods Explanation;Demonstration   Comprehension  Verbalized understanding;Verbal cues required          OT Short Term Goals - 03/20/15 1719    OT SHORT TERM GOAL #1   Title Pt and wife will be mod I with HEP - 01/18/2015.  Goal dates adjusted as pt will not return to clinic prior to 12/21/14   Status Achieved   OT SHORT TERM GOAL #2   Title Pt will require min a for LB dressing   Status Achieved   OT SHORT TERM GOAL #3   Title Pt will demonstrate ability to use LUE as gross assist during basic self care 50% of the time.   Status Partially Met   OT SHORT TERM GOAL #4   Title Pt will need no more than min vc's to compensate for L neglect during functional mobility tasks   Status Achieved   OT SHORT TERM GOAL #5   Title Pt will  demonstrate ability to attend to familiar functional task for 15 minute with no more than one vc.   Status Achieved   OT SHORT TERM GOAL #6   Title Pt will require no more than min assist for toilet transfers   Status Achieved   OT SHORT TERM GOAL #7   Title Pt and wife will be mod I with upgraded HEP prn - 03/14/2015   Status Achieved   OT SHORT TERM GOAL #8   Title Pt will be able to use LUE as gross assist 75% of the time for basic ADL tasks with min cues   Status Achieved   OT SHORT TERM GOAL  #9   TITLE Pt will require supervision for tub bench transfers    Status Deferred  pt is now stepping over ledge and into shower wtih min with wife to sit on seat   OT SHORT TERM GOAL  #10   TITLE Pt will require min a for simple snack prep at ambulatory level   Status Achieved           OT Long Term Goals - 03/20/15 1719    OT LONG TERM GOAL #1   Title Pt and wife will be mod I with upgraded HEP - 02/15/2015. Goal date adjusted as pt will not return to clinic prior to 12/21/2014   Status Achieved   OT LONG TERM GOAL #2   Title Pt will demonstrate ability to use LUE as gross assist 100% of the time in basic self care tasks   Status Not Met   OT LONG TERM GOAL #3   Title Pt will demonstrate ability to hold cylindrical object in L hand.    Status Achieved   OT LONG TERM GOAL #4   Title Pt will verbalize understanding of AE for buttoning, zipping and cutting   Status Achieved   OT LONG TERM GOAL #5   Title Pt will be supervision for toilet transfers   Status Achieved   OT LONG TERM GOAL #6   Title Pt will be min a for shower transfers   Status Achieved   OT LONG TERM GOAL #7   Title Pt will  be supervision for simple hot meal prep   Status Deferred   OT LONG TERM GOAL #8   Title Pt will demonstrate the ability to use LUE as gross a for basic ADL's 100% of the time - 04/11/2015   Status On-going   OT LONG TERM GOAL  #9   Baseline Pt will be mod I for toilet transfers  Status  On-going   OT LONG TERM GOAL  #10   TITLE Pt will be supervision for simple hot meal prep   Status On-going   OT LONG TERM GOAL  #11   TITLE Pt will demonstrate ability for blilateral mid reach for functional tasks   Status On-going               Plan - 03/31/15 1607    Clinical Impression Statement Patient showing steady improvement with functional mobility, and some beginning controlof movement in left upper extremity.  Patient with significant sensory, perceptual, motor, and cognitive deficits which influence his ability to utilize left arm functionally, although patient and wife both motivated to incorporate as possible in home environment.   Pt will benefit from skilled therapeutic intervention in order to improve on the following deficits (Retired) Decreased balance;Decreased coordination;Decreased cognition;Decreased knowledge of use of DME;Decreased mobility;Decreased range of motion;Decreased safety awareness;Difficulty walking;Decreased strength;Increased edema;Increased muscle spasms;Impaired UE functional use;Impaired tone;Impaired sensation;Impaired vision/preception;Pain   Rehab Potential Good   Clinical Impairments Affecting Rehab Potential Pt will benefit from skilled OT to address deficits outlined in eval to maximize independence.   OT Frequency 2x / week   OT Duration 8 weeks   OT Treatment/Interventions Self-care/ADL training;Cryotherapy;Moist Heat;Electrical Stimulation;Fluidtherapy;DME and/or AE instruction;Energy conservation;Neuromuscular education;Therapeutic exercise;Functional Mobility Training;Manual Therapy;Passive range of motion;Splinting;Therapeutic activities;Balance training;Patient/family education;Cognitive remediation/compensation   Plan NMR Left upper extremity, cooking in kitchen, balance, safety, postural control   Consulted and Agree with Plan of Care Patient;Family member/caregiver   Family Member Consulted wife        Problem List Patient  Active Problem List   Diagnosis Date Noted  . Spastic hemiparesis affecting nondominant side 11/08/2014  . Hypothyroidism 10/27/2014  . Left-sided neglect 09/23/2014  . Acute ischemic right MCA stroke 09/16/2014  . Left hemiparesis   . CVA (cerebral vascular accident)   . Right carotid artery occlusion   . Acute right MCA stroke   . Essential hypertension 09/12/2014  . CVA (cerebral infarction) 09/12/2014  . Type 2 diabetes mellitus with peripheral neuropathy 09/12/2014    Mariah Milling, OTR/L 03/31/2015, 4:11 PM  [facility] [address] [place], [place], [address] Phone: [phone]   Fax:  [phone]

## 2015-04-01 NOTE — Therapy (Signed)
[facility] [address] [place], [place], [address] Phone: [phone]   Fax:  [phone]  Physical Therapy Treatment  Patient Details  Name: Brian Hall MRN: [id number] Date of Birth: 19-May-1956 Referring Provider:  Claretta Fraise, MD  Encounter Date: 03/31/2015      PT End of Session - 04/01/15 1219    Visit Number 28   Number of Visits 33   Date for PT Re-Evaluation 04/08/15   PT Start Time 1315   PT Stop Time 1400   PT Time Calculation (min) 45 min   Equipment Utilized During Treatment Gait belt   Activity Tolerance Patient tolerated treatment well   Behavior During Therapy [facility] for tasks assessed/performed      Past Medical History  Diagnosis Date  . Hypertension   . Diabetes mellitus   . Obesity   . CHF (congestive heart failure)   . SOB (shortness of breath)   . Hyperkalemia   . Renal insufficiency   . Cardiogenic shock   . Cardiac LV ejection fraction 10-20%   . Hyperlipemia   . Hypothyroidism   . Throat cancer 2011    s/p neck dissection, radiation, chemo  . Stroke   . Blurred vision   . Joint pain   . Joint swelling     Past Surgical History  Procedure Laterality Date  . Cardiac catheterization    . Neck dissection Right 2011    s/p resection of throat cancer with multiple nodes removed    There were no vitals filed for this visit.  Visit Diagnosis:  Abnormality of gait  Left spastic hemiplegia               03/31/15 1400  Transfers  Stand to Sit 6: Modified independent (Device/Increase time) [facility])  Transfers Sit to Stand;Stand to Sit  Sit to Stand 6: Modified independent (Device/Increase time)  Stand to Sit Details SBQC  Ambulation/Gait  Ambulation/Gait Yes  Ambulation/Gait Assistance 5: Supervision;4: Min assist  Ambulation/Gait Assistance Details Supervision over level, indoor surfaces; close supervision to Min A (to recover from single LOB) over  unlevel paved surfaces  Ambulation Distance (Feet) 340 Feet (190' over indoor surfaces; 150' over asphalt, grass)  Assistive device Small based quad cane  Gait Pattern Step-through pattern;Decreased dorsiflexion - left;Decreased weight shift to left;Trunk rotated posteriorly on left;Decreased arm swing - left  Ambulation Surface Level;Indoor;Unlevel;Outdoor;Paved;Gravel  Stairs Yes  Stairs Assistance 5: Supervision  Stairs Assistance Details (indicate cue type and reason) Pt most stable/independent ascending and descending with LLE leading; appeared to be most effective during ascent secondary to decreased effect of tone (LLE adduction) on stability, on descent due to pt comfort/stability with weight shifting  Stair Management Technique One rail Right;Step to pattern;Forwards  Number of Stairs 8 (8 stairs x2 trials; standing rest between)  Height of Stairs 6  Door Management 3: Mod assist (using SBQC)  Door Managment Details (indicate cue type and reason) cueing for safe proximity to door prior to opening  Curb 4: Min assist;3: Mod assist (using SBQC)  Curb Details (indicate cue type and reason) Most stability/independence ascending with RLE leading and descending with LLE leading. cueing for LLE placement (less adduction) during descent, positioning feet more closely to curb prior to initiating ascent and descent.  Gait Comments Noted improvement in L hip/knee flexion (as opposed to LLE circumduction) during LLE advancement, as compared with previous session  PT Education - 04/01/15 1204    Education provided Yes   Education Details Sequencing with [facility] for curb step negotation.   Person(s) Educated Patient;Spouse   Methods Explanation;Demonstration;Verbal cues   Comprehension Verbalized understanding;Returned demonstration;Need further instruction  Plan to reinforce in future session          PT Short Term Goals - 03/07/15 1339    PT SHORT TERM GOAL #1    Title Pt will perform HEP with supervision of family, for improved strength and balance. (Target 01/10/15)   Time 4   Period Weeks   Status Achieved   PT SHORT TERM GOAL #2   Title Pt will improve Timed UP and Go score to less than or equal to 35 seconds for decreased fall risk.   Baseline eval-46.78 sec   Time 4   Period Weeks   Status Not Met  46.81 with quad cane and close supervision and occasional CGA   PT SHORT TERM GOAL #3   Title Berg Balance score to be assessed, with pt scoring at least 5 additional points to decrease fall risk.   Time 4   Period Weeks   Status Achieved  Scored 33 today   PT SHORT TERM GOAL #4   Title Pt will perform at least 6 of 10 reps of sit<>stand transfers no UE support, with equal weightbearing through bilateral lower extremities, independently.   Time 4   Period Weeks   Status Achieved   PT SHORT TERM GOAL #5   Title Pt will improve TUG time from 29.69 sec to 25 seconds for decreased fall risk. Target date: 03/08/15.   Time 4   Period Weeks   Status On-going   PT SHORT TERM GOAL #6   Title Pt will ambulate x200' over level surfaces with SBQC and supervision with no overt LOB. Target date: 03/08/15.   Baseline Achieved 03/07/15.   Time 4   Period Weeks   Status Achieved           PT Long Term Goals - 03/31/15 1326    PT LONG TERM GOAL #1   Title Pt will verbalize understanding of fall prevention within home environment. Target 02/10/15   Time 8   Period Weeks   Status Achieved   PT LONG TERM GOAL #2   Title Pt will improve TUG to less than or equal to 25 seconds for decreased fall risk.   Baseline 7/12: 31.87 sec with SBQC   Time 8   Status Not Met  Unmet goal will continue as short-term goal   PT LONG TERM GOAL #3   Title Pt will improve gait velocity to at least 1 ft/sec for improved gait efficiency and safety.   Time 8   Period Weeks   Status Achieved   PT LONG TERM GOAL #4   Title Pt will ambulate at least 50-100 ft using  hemiwalker, household distances, with supervision.   Time 8   Period Weeks   Status Achieved   PT LONG TERM GOAL #5   Title Pt will ambulate 150' over outdoor, unlevel surfaces with LRAD and supervision to indicate increased stability/independence with community mobility. Target date: 04/05/15.   Time 8   Period Weeks   Status On-going   PT LONG TERM GOAL #6   Title Pt will negotiate 4 stairs with single rail and supervision to increase pt independence with community mobility. Target date: 04/05/15.   Baseline Achieved 7/29.   Time 8   Period Weeks  Status Achieved   PT LONG TERM GOAL #7   Title Pt will increase gait speed from 1.02 ft/sec to 1.62 ft/sec to indicate significant improvement in efficiency of ambulation. Target date: 04/05/15.   Time 8   Period Weeks   Status On-going               Plan - 04/01/15 1221    Clinical Impression Statement Pt met LTG for negotiation of 4 stairs with 1 rail and supervision. Required close supervision to min A during outdoor gait trial x150'; plan to readdress at beginning of next session (due to significant pt fatigue when assessed at end of session today). Continue per POC.   Pt will benefit from skilled therapeutic intervention in order to improve on the following deficits Abnormal gait;Decreased activity tolerance;Decreased balance;Decreased mobility;Decreased range of motion;Decreased safety awareness;Difficulty walking;Decreased strength;Impaired tone;Decreased knowledge of use of DME;Decreased endurance;Decreased coordination;Impaired flexibility;Impaired perceived functional ability;Postural dysfunction;Impaired sensation;Impaired UE functional use;Impaired vision/preception;Decreased cognition;Pain   PT Frequency 2x / week   PT Duration 8 weeks   PT Treatment/Interventions ADLs/Self Care Home Management;Therapeutic activities;Functional mobility training;Stair training;Gait training;Therapeutic exercise;Balance training;Neuromuscular  re-education;Patient/family education;DME Instruction;Cognitive remediation;Orthotic Fit/Training;Manual techniques;Passive range of motion;Visual/perceptual remediation/compensation   PT Next Visit Plan Assess LTG for community ambulationat beginning of next session. Check remainder of LTG's by 8/2.   Consulted and Agree with Plan of Care Patient;Family member/caregiver   Family Member Consulted wife, [name]        Problem List Patient Active Problem List   Diagnosis Date Noted  . Spastic hemiparesis affecting nondominant side 11/08/2014  . Hypothyroidism 10/27/2014  . Left-sided neglect 09/23/2014  . Acute ischemic right MCA stroke 09/16/2014  . Left hemiparesis   . CVA (cerebral vascular accident)   . Right carotid artery occlusion   . Acute right MCA stroke   . Essential hypertension 09/12/2014  . CVA (cerebral infarction) 09/12/2014  . Type 2 diabetes mellitus with peripheral neuropathy 09/12/2014    Billie Ruddy, PT, DPT [facility] [address] [place], [place], [address] Phone: [phone]   Fax:  [phone] 04/01/2015, 12:25 PM

## 2015-04-03 ENCOUNTER — Ambulatory Visit: Payer: 59 | Admitting: Occupational Therapy

## 2015-04-03 ENCOUNTER — Encounter: Payer: Self-pay | Admitting: Occupational Therapy

## 2015-04-03 ENCOUNTER — Ambulatory Visit: Payer: 59 | Attending: Physical Medicine & Rehabilitation | Admitting: Physical Therapy

## 2015-04-03 DIAGNOSIS — Z7409 Other reduced mobility: Secondary | ICD-10-CM | POA: Insufficient documentation

## 2015-04-03 DIAGNOSIS — G8110 Spastic hemiplegia affecting unspecified side: Secondary | ICD-10-CM | POA: Diagnosis present

## 2015-04-03 DIAGNOSIS — R269 Unspecified abnormalities of gait and mobility: Secondary | ICD-10-CM | POA: Diagnosis not present

## 2015-04-03 DIAGNOSIS — R201 Hypoesthesia of skin: Secondary | ICD-10-CM | POA: Diagnosis present

## 2015-04-03 DIAGNOSIS — R2681 Unsteadiness on feet: Secondary | ICD-10-CM | POA: Insufficient documentation

## 2015-04-03 DIAGNOSIS — R278 Other lack of coordination: Secondary | ICD-10-CM | POA: Insufficient documentation

## 2015-04-03 DIAGNOSIS — G8194 Hemiplegia, unspecified affecting left nondominant side: Secondary | ICD-10-CM | POA: Diagnosis present

## 2015-04-03 DIAGNOSIS — R4189 Other symptoms and signs involving cognitive functions and awareness: Secondary | ICD-10-CM | POA: Diagnosis present

## 2015-04-03 DIAGNOSIS — G8114 Spastic hemiplegia affecting left nondominant side: Secondary | ICD-10-CM

## 2015-04-03 NOTE — Therapy (Signed)
[facility] [address] [place], [place], [address] Phone: [phone]   Fax:  [phone]  Occupational Therapy Treatment  Patient Details  Name: Brian Hall MRN: [id number] Date of Birth: March 25, 1956 Referring Provider:  Claretta Fraise, MD  Encounter Date: 04/03/2015      OT End of Session - 04/03/15 1555    Visit Number 29   Number of Visits 32   Date for OT Re-Evaluation 04/11/15   Authorization Type [employer]   OT Start Time 1445   OT Stop Time 1538   OT Time Calculation (min) 53 min   Activity Tolerance Patient tolerated treatment well   Behavior During Therapy [facility] for tasks assessed/performed      Past Medical History  Diagnosis Date  . Hypertension   . Diabetes mellitus   . Obesity   . CHF (congestive heart failure)   . SOB (shortness of breath)   . Hyperkalemia   . Renal insufficiency   . Cardiogenic shock   . Cardiac LV ejection fraction 10-20%   . Hyperlipemia   . Hypothyroidism   . Throat cancer 2011    s/p neck dissection, radiation, chemo  . Stroke   . Blurred vision   . Joint pain   . Joint swelling     Past Surgical History  Procedure Laterality Date  . Cardiac catheterization    . Neck dissection Right 2011    s/p resection of throat cancer with multiple nodes removed    There were no vitals filed for this visit.  Visit Diagnosis:  Hemiplegia affecting left nondominant side  Impaired sensation  Impaired cognition  Left spastic hemiplegia      Subjective Assessment - 04/03/15 1545    Subjective  I am doing my exercises at home.   Patient is accompained by: Family member   Pertinent History see epic snapshot   Patient Stated Goals Get back to doing some of the things I used to do.   Currently in Pain? No/denies   Pain Score 0-No pain                      OT Treatments/Exercises (OP) - 04/03/15 1548    Neurological Re-education Exercises   Other  Exercises 1 Neuromuscular reeducation to address active control in left upper extremity during transitional movements, and in isolation.  Patient able to isolate external rotation in supine through full range with humerus in slightly flexed position.  Patient able to inhibit internal rotators to activate extrenal rotators, although have not yet seen this carry over to functional activity.  Rolling onto and off of left upper extremity working to access scapular stabilizers, and coordinate trunk and upper extremity movement.  Patient with consistent tendency to over recruit musculature in head, upper back, and hold breath with effort. With manual facilitation, able to recruit more natural movement pattern, and facilitate scapular stability.   Other Exercises 2 Following weight bearing, and neuromuscular reeducation in supine and sitting, worked on managing left upper extremity position with posture in standing and stepping.  Standing and carrying lightweight case with handle.  Emphasis here to manage arm to side of body versus in front of body.  Having an actualobject to manage seemed to help clarify movement requirements for patient.  Worked in standing at sink to turn on/off faucet with left hand, pour out cup of water using elbow extension and pronation.  Also addressed dynamic stand balance as needed for ADL.  Standing  to turn and look or reach behind - as needed for hygiene.                  OT Education - 04/03/15 1555    Education provided Yes   Education Details Breathing with effort during exercise   Person(s) Educated Patient;Spouse   Methods Explanation;Demonstration   Comprehension Verbalized understanding;Returned demonstration;Verbal cues required          OT Short Term Goals - 03/20/15 1719    OT SHORT TERM GOAL #1   Title Pt and wife will be mod I with HEP - 01/18/2015.  Goal dates adjusted as pt will not return to clinic prior to 12/21/14   Status Achieved   OT SHORT TERM  GOAL #2   Title Pt will require min a for LB dressing   Status Achieved   OT SHORT TERM GOAL #3   Title Pt will demonstrate ability to use LUE as gross assist during basic self care 50% of the time.   Status Partially Met   OT SHORT TERM GOAL #4   Title Pt will need no more than min vc's to compensate for L neglect during functional mobility tasks   Status Achieved   OT SHORT TERM GOAL #5   Title Pt will demonstrate ability to attend to familiar functional task for 15 minute with no more than one vc.   Status Achieved   OT SHORT TERM GOAL #6   Title Pt will require no more than min assist for toilet transfers   Status Achieved   OT SHORT TERM GOAL #7   Title Pt and wife will be mod I with upgraded HEP prn - 03/14/2015   Status Achieved   OT SHORT TERM GOAL #8   Title Pt will be able to use LUE as gross assist 75% of the time for basic ADL tasks with min cues   Status Achieved   OT SHORT TERM GOAL  #9   TITLE Pt will require supervision for tub bench transfers    Status Deferred  pt is now stepping over ledge and into shower wtih min with wife to sit on seat   OT SHORT TERM GOAL  #10   TITLE Pt will require min a for simple snack prep at ambulatory level   Status Achieved           OT Long Term Goals - 03/20/15 1719    OT LONG TERM GOAL #1   Title Pt and wife will be mod I with upgraded HEP - 02/15/2015. Goal date adjusted as pt will not return to clinic prior to 12/21/2014   Status Achieved   OT LONG TERM GOAL #2   Title Pt will demonstrate ability to use LUE as gross assist 100% of the time in basic self care tasks   Status Not Met   OT LONG TERM GOAL #3   Title Pt will demonstrate ability to hold cylindrical object in L hand.    Status Achieved   OT LONG TERM GOAL #4   Title Pt will verbalize understanding of AE for buttoning, zipping and cutting   Status Achieved   OT LONG TERM GOAL #5   Title Pt will be supervision for toilet transfers   Status Achieved   OT LONG  TERM GOAL #6   Title Pt will be min a for shower transfers   Status Achieved   OT LONG TERM GOAL #7   Title Pt will  be supervision for  simple hot meal prep   Status Deferred   OT LONG TERM GOAL #8   Title Pt will demonstrate the ability to use LUE as gross a for basic ADL's 100% of the time - 04/11/2015   Status On-going   OT LONG TERM GOAL  #9   Baseline Pt will be mod I for toilet transfers   Status On-going   OT LONG TERM GOAL  #10   TITLE Pt will be supervision for simple hot meal prep   Status On-going   OT LONG TERM GOAL  #11   TITLE Pt will demonstrate ability for blilateral mid reach for functional tasks   Status On-going               Plan - 04/03/15 1601    Clinical Impression Statement Patient continues to show steady improvement with functional mobility, balance, and movement, prefunctional use of left UE. Patient with significant sensory, perceptual, motor, and cognitve defictis which influence his ability to utilize left arm functionally for ADL   Pt will benefit from skilled therapeutic intervention in order to improve on the following deficits (Retired) Decreased balance;Decreased coordination;Decreased cognition;Decreased knowledge of use of DME;Decreased mobility;Decreased range of motion;Decreased safety awareness;Difficulty walking;Decreased strength;Increased edema;Increased muscle spasms;Impaired UE functional use;Impaired tone;Impaired sensation;Impaired vision/preception;Pain   Rehab Potential Good   Clinical Impairments Affecting Rehab Potential Pt will benefit from skilled OT to address deficits outlined in eval to maximize independence.   OT Frequency 2x / week   OT Duration 8 weeks   OT Treatment/Interventions Self-care/ADL training;Cryotherapy;Moist Heat;Electrical Stimulation;Fluidtherapy;DME and/or AE instruction;Energy conservation;Neuromuscular education;Therapeutic exercise;Functional Mobility Training;Manual Therapy;Passive range of  motion;Splinting;Therapeutic activities;Balance training;Patient/family education;Cognitive remediation/compensation   Plan Home making - address cooking goal if wife brings kitchen lay out - functional balance and use of bilateral UE in IADL   Consulted and Agree with Plan of Care Patient;Family member/caregiver   Family Member Consulted wife        Problem List Patient Active Problem List   Diagnosis Date Noted  . Spastic hemiparesis affecting nondominant side 11/08/2014  . Hypothyroidism 10/27/2014  . Left-sided neglect 09/23/2014  . Acute ischemic right MCA stroke 09/16/2014  . Left hemiparesis   . CVA (cerebral vascular accident)   . Right carotid artery occlusion   . Acute right MCA stroke   . Essential hypertension 09/12/2014  . CVA (cerebral infarction) 09/12/2014  . Type 2 diabetes mellitus with peripheral neuropathy 09/12/2014    Mariah Milling, OTR/L 04/03/2015, 4:02 PM  [facility] [address] [place], [place], [address] Phone: [phone]   Fax:  [phone]

## 2015-04-03 NOTE — Therapy (Signed)
[name] [address] [name], [place], [address] Phone: [phone]   Fax:  [phone]  Physical Therapy Treatment  Patient Details  Name: Brian Hall MRN: [id number] Date of Birth: Dec 17, 1955 Referring Provider:  Claretta Fraise, MD  Encounter Date: 04/03/2015      PT End of Session - 04/03/15 1719    Visit Number 29   Number of Visits 33   Date for PT Re-Evaluation 04/08/15   PT Start Time 1400   PT Stop Time 1445   PT Time Calculation (min) 45 min   Equipment Utilized During Treatment Gait belt   Activity Tolerance Patient tolerated treatment well   Behavior During Therapy [facility] for tasks assessed/performed      Past Medical History  Diagnosis Date  . Hypertension   . Diabetes mellitus   . Obesity   . CHF (congestive heart failure)   . SOB (shortness of breath)   . Hyperkalemia   . Renal insufficiency   . Cardiogenic shock   . Cardiac LV ejection fraction 10-20%   . Hyperlipemia   . Hypothyroidism   . Throat cancer 2011    s/p neck dissection, radiation, chemo  . Stroke   . Blurred vision   . Joint pain   . Joint swelling     Past Surgical History  Procedure Laterality Date  . Cardiac catheterization    . Neck dissection Right 2011    s/p resection of throat cancer with multiple nodes removed    There were no vitals filed for this visit.  Visit Diagnosis:  Abnormality of gait  Left spastic hemiplegia  Coordination abnormal      Subjective Assessment - 04/03/15 1403    Patient is accompained by: Family member   Pertinent History CVA with L hemiparesis 09/2014   Patient Stated Goals Pt's goal is to get back to walking and using L hand again.  He has a motorcycle and would like to ride it again.    Currently in Pain? Yes   Pain Score 1    Pain Location Ankle   Pain Orientation Left   Pain Descriptors / Indicators Tingling   Pain Type Chronic pain   Pain Onset More than a  month ago   Pain Frequency Constant   Aggravating Factors  "has been pretty constant since the stroke"            [facility] PT Assessment - 04/03/15 0001    Strength   Right/Left Ankle Left   Right Ankle Dorsiflexion --   Right Ankle Eversion --   Left Ankle Dorsiflexion 2+/5   Left Ankle Plantar Flexion 4/5   Left Ankle Inversion 4/5   Left Ankle Eversion 1/5                     [facility] Adult PT Treatment/Exercise - 04/03/15 0001    Transfers   Transfers Sit to Stand;Stand to Sit   Stand to Sit 6: Modified independent (Device/Increase time)   Transfers Sit to Stand;Stand to Sit   Sit to Stand 6: Modified independent (Device/Increase time)   Stand to Sit Details SBQC   Ambulation/Gait   Ambulation/Gait Yes   Ambulation/Gait Assistance 5: Supervision   Ambulation Distance (Feet) 250 Feet   Assistive device Small based quad cane   Gait Pattern Step-through pattern;Decreased dorsiflexion - left;Decreased weight shift to left;Trunk rotated posteriorly on left;Decreased arm swing - left   Neuro Re-ed    Neuro Re-ed  Details  For tone management, performed prolonged passive manual stretch of L gastrocnemius 2 x60-second holds, of L great toe flexors x60 seconds. With NMES on L peroneals for L ankle dorsiflexion without excessive supination, performed AAROM with manual assist 4 x15 reps. In gravity-eliminated position (supine), pt able to consistently perform L ankle dorsiflexion.                  PT Short Term Goals - 03/07/15 1339    PT SHORT TERM GOAL #1   Title Pt will perform HEP with supervision of family, for improved strength and balance. (Target 01/10/15)   Time 4   Period Weeks   Status Achieved   PT SHORT TERM GOAL #2   Title Pt will improve Timed UP and Go score to less than or equal to 35 seconds for decreased fall risk.   Baseline eval-46.78 sec   Time 4   Period Weeks   Status Not Met  46.81 with quad cane and close supervision and occasional  CGA   PT SHORT TERM GOAL #3   Title Berg Balance score to be assessed, with pt scoring at least 5 additional points to decrease fall risk.   Time 4   Period Weeks   Status Achieved  Scored 33 today   PT SHORT TERM GOAL #4   Title Pt will perform at least 6 of 10 reps of sit<>stand transfers no UE support, with equal weightbearing through bilateral lower extremities, independently.   Time 4   Period Weeks   Status Achieved   PT SHORT TERM GOAL #5   Title Pt will improve TUG time from 29.69 sec to 25 seconds for decreased fall risk. Target date: 03/08/15.   Time 4   Period Weeks   Status On-going   PT SHORT TERM GOAL #6   Title Pt will ambulate x200' over level surfaces with SBQC and supervision with no overt LOB. Target date: 03/08/15.   Baseline Achieved 03/07/15.   Time 4   Period Weeks   Status Achieved           PT Long Term Goals - 04/03/15 1421    PT LONG TERM GOAL #1   Title Pt will verbalize understanding of fall prevention within home environment. Target 02/10/15   Time 8   Period Weeks   Status Achieved   PT LONG TERM GOAL #2   Title Pt will improve TUG to less than or equal to 25 seconds for decreased fall risk.   Baseline 7/12: 31.87 sec with SBQC   Time 8   Status Not Met  Unmet goal will continue as short-term goal   PT LONG TERM GOAL #3   Title Pt will improve gait velocity to at least 1 ft/sec for improved gait efficiency and safety.   Time 8   Period Weeks   Status Achieved   PT LONG TERM GOAL #4   Title Pt will ambulate at least 50-100 ft using hemiwalker, household distances, with supervision.   Time 8   Period Weeks   Status Achieved   PT LONG TERM GOAL #5   Title Pt will ambulate 150' over outdoor, unlevel surfaces with LRAD and supervision to indicate increased stability/independence with community mobility. Target date: 04/05/15.   Baseline Met 04/03/15 with pt using SBQC, requiring 25% cueing for safe gait velocity.   Time 8   Period Weeks   Status  Achieved   PT LONG TERM GOAL #6   Title Pt will  negotiate 4 stairs with single rail and supervision to increase pt independence with community mobility. Target date: 04/05/15.   Baseline Achieved 7/29.   Time 8   Period Weeks   Status Achieved   PT LONG TERM GOAL #7   Title Pt will increase gait speed from 1.02 ft/sec to 1.62 ft/sec to indicate significant improvement in efficiency of ambulation. Target date: 04/05/15.   Time 8   Period Weeks   Status On-going               Plan - 04/03/15 1719    Clinical Impression Statement Pt met LTG for community ambulation x150' with supervision. During this session, pt performed active L ankle dorsiflexion through full ROM in gravity-eliminated position for the first time. Pt will continue to benefit from skilled PT to progress functional mobility, decrease fall risk. Continue per POC.   Pt will benefit from skilled therapeutic intervention in order to improve on the following deficits Abnormal gait;Decreased activity tolerance;Decreased balance;Decreased mobility;Decreased range of motion;Decreased safety awareness;Difficulty walking;Decreased strength;Impaired tone;Decreased knowledge of use of DME;Decreased endurance;Decreased coordination;Impaired flexibility;Impaired perceived functional ability;Postural dysfunction;Impaired sensation;Impaired UE functional use;Impaired vision/preception;Decreased cognition;Pain   Rehab Potential Good   PT Frequency 2x / week   PT Duration 8 weeks   PT Treatment/Interventions ADLs/Self Care Home Management;Therapeutic activities;Functional mobility training;Stair training;Gait training;Therapeutic exercise;Balance training;Neuromuscular re-education;Patient/family education;DME Instruction;Cognitive remediation;Orthotic Fit/Training;Manual techniques;Passive range of motion;Visual/perceptual remediation/compensation   PT Next Visit Plan Finish assessing LTG's and recert, if appropriate, by 8/2.   Consulted and  Agree with Plan of Care Patient;Family member/caregiver   Family Member Consulted wife, [name]        Problem List Patient Active Problem List   Diagnosis Date Noted  . Spastic hemiparesis affecting nondominant side 11/08/2014  . Hypothyroidism 10/27/2014  . Left-sided neglect 09/23/2014  . Acute ischemic right MCA stroke 09/16/2014  . Left hemiparesis   . CVA (cerebral vascular accident)   . Right carotid artery occlusion   . Acute right MCA stroke   . Essential hypertension 09/12/2014  . CVA (cerebral infarction) 09/12/2014  . Type 2 diabetes mellitus with peripheral neuropathy 09/12/2014    Billie Ruddy, PT, DPT [facility] [address] [place], [place], [address] Phone: [phone]   Fax:  [phone] 04/03/2015, 5:22 PM

## 2015-04-05 ENCOUNTER — Ambulatory Visit: Payer: 59 | Admitting: Physical Therapy

## 2015-04-05 ENCOUNTER — Ambulatory Visit: Payer: 59 | Admitting: Occupational Therapy

## 2015-04-05 ENCOUNTER — Encounter: Payer: Self-pay | Admitting: Occupational Therapy

## 2015-04-05 DIAGNOSIS — R269 Unspecified abnormalities of gait and mobility: Secondary | ICD-10-CM

## 2015-04-05 DIAGNOSIS — R2681 Unsteadiness on feet: Secondary | ICD-10-CM

## 2015-04-05 DIAGNOSIS — G8114 Spastic hemiplegia affecting left nondominant side: Secondary | ICD-10-CM

## 2015-04-05 DIAGNOSIS — G8194 Hemiplegia, unspecified affecting left nondominant side: Secondary | ICD-10-CM

## 2015-04-05 DIAGNOSIS — R201 Hypoesthesia of skin: Secondary | ICD-10-CM

## 2015-04-05 NOTE — Therapy (Signed)
[facility] [address] [place], [place], [address] Phone: [phone]   Fax:  [phone]  Occupational Therapy Treatment  Patient Details  Name: Brian Hall MRN: [id number] Date of Birth: 01-04-1956 Referring Provider:  Claretta Fraise, MD  Encounter Date: 04/05/2015      OT End of Session - 04/05/15 1652    Visit Number 30   Number of Visits 32   Date for OT Re-Evaluation 04/11/15   Authorization Type [employer]   OT Start Time 1445   OT Stop Time 1530   OT Time Calculation (min) 45 min   Activity Tolerance Patient tolerated treatment well   Behavior During Therapy [facility] for tasks assessed/performed      Past Medical History  Diagnosis Date  . Hypertension   . Diabetes mellitus   . Obesity   . CHF (congestive heart failure)   . SOB (shortness of breath)   . Hyperkalemia   . Renal insufficiency   . Cardiogenic shock   . Cardiac LV ejection fraction 10-20%   . Hyperlipemia   . Hypothyroidism   . Throat cancer 2011    s/p neck dissection, radiation, chemo  . Stroke   . Blurred vision   . Joint pain   . Joint swelling     Past Surgical History  Procedure Laterality Date  . Cardiac catheterization    . Neck dissection Right 2011    s/p resection of throat cancer with multiple nodes removed    There were no vitals filed for this visit.  Visit Diagnosis:  Hemiplegia affecting left nondominant side  Left spastic hemiplegia  Impaired sensation      Subjective Assessment - 04/05/15 1645    Subjective  I want to do better.   Patient is accompained by: Family member   Pertinent History see epic snapshot   Patient Stated Goals Get back to doing some of the things I used to do.                      OT Treatments/Exercises (OP) - 04/05/15 0001    ADLs   Cooking Patient has a long term goal related to cooking a simple hot meal.  Patient and his wife have been anxious anticipating  this goal.  Wife brought in house plans to show layoutof kitchen and bathroom as related to OT goals.  Demonstrated rolling cart for kitchen as patient has large expanse of floor space to cross between appliances/sink and island.  Patient able to fry egg with minimal physical assistance to manage left extremities.  Patient with increased tension throughout left side when completing a task in standing.  Patient made several attempts to incorporate left arm into task.  Able to hold objects to wash/rinse following cooking, able to wipe counter tops with left hand and mod assist.  Patient became short of breath during this activity, however this appeared to be due as much to anxiety as to physical fatigue.  Patient will require additional task specific practice for optimal result.                  OT Education - 04/05/15 1652    Education provided Yes   Education Details Task specific practice - increased tension noted in upper extremities with tasks done in standing   Person(s) Educated Patient;Spouse   Methods Explanation;Demonstration   Comprehension Verbalized understanding          OT Short Term Goals - 03/20/15  [place] #1   Title Pt and wife will be mod I with HEP - 01/18/2015.  Goal dates adjusted as pt will not return to clinic prior to 12/21/14   Status Achieved   OT SHORT TERM GOAL #2   Title Pt will require min a for LB dressing   Status Achieved   OT SHORT TERM GOAL #3   Title Pt will demonstrate ability to use LUE as gross assist during basic self care 50% of the time.   Status Partially Met   OT SHORT TERM GOAL #4   Title Pt will need no more than min vc's to compensate for L neglect during functional mobility tasks   Status Achieved   OT SHORT TERM GOAL #5   Title Pt will demonstrate ability to attend to familiar functional task for 15 minute with no more than one vc.   Status Achieved   OT SHORT TERM GOAL #6   Title Pt will require no more than min  assist for toilet transfers   Status Achieved   OT SHORT TERM GOAL #7   Title Pt and wife will be mod I with upgraded HEP prn - 03/14/2015   Status Achieved   OT SHORT TERM GOAL #8   Title Pt will be able to use LUE as gross assist 75% of the time for basic ADL tasks with min cues   Status Achieved   OT SHORT TERM GOAL  #9   TITLE Pt will require supervision for tub bench transfers    Status Deferred  pt is now stepping over ledge and into shower wtih min with wife to sit on seat   OT SHORT TERM GOAL  #10   TITLE Pt will require min a for simple snack prep at ambulatory level   Status Achieved           OT Long Term Goals - 03/20/15 1719    OT LONG TERM GOAL #1   Title Pt and wife will be mod I with upgraded HEP - 02/15/2015. Goal date adjusted as pt will not return to clinic prior to 12/21/2014   Status Achieved   OT LONG TERM GOAL #2   Title Pt will demonstrate ability to use LUE as gross assist 100% of the time in basic self care tasks   Status Not Met   OT LONG TERM GOAL #3   Title Pt will demonstrate ability to hold cylindrical object in L hand.    Status Achieved   OT LONG TERM GOAL #4   Title Pt will verbalize understanding of AE for buttoning, zipping and cutting   Status Achieved   OT LONG TERM GOAL #5   Title Pt will be supervision for toilet transfers   Status Achieved   OT LONG TERM GOAL #6   Title Pt will be min a for shower transfers   Status Achieved   OT LONG TERM GOAL #7   Title Pt will  be supervision for simple hot meal prep   Status Deferred   OT LONG TERM GOAL #8   Title Pt will demonstrate the ability to use LUE as gross a for basic ADL's 100% of the time - 04/11/2015   Status On-going   OT LONG TERM GOAL  #9   Baseline Pt will be mod I for toilet transfers   Status On-going   OT LONG TERM GOAL  #10   TITLE Pt will be supervision for simple  hot meal prep   Status On-going   OT LONG TERM GOAL  #11   TITLE Pt will demonstrate ability for blilateral  mid reach for functional tasks   Status On-going               Plan - 04/05/15 1653    Clinical Impression Statement Patient continues to show functional improvement, especially related to functional mobility (see BERG results) and balance.  Patient with significant mobility, sensory, perceptual, and cognitive deficits which require task specific practice and increased repetition for improved performance of ADL/IADL.   Pt will benefit from skilled therapeutic intervention in order to improve on the following deficits (Retired) Decreased balance;Decreased coordination;Decreased cognition;Decreased knowledge of use of DME;Decreased mobility;Decreased range of motion;Decreased safety awareness;Difficulty walking;Decreased strength;Increased edema;Increased muscle spasms;Impaired UE functional use;Impaired tone;Impaired sensation;Impaired vision/preception;Pain   Clinical Impairments Affecting Rehab Potential Pt will benefit from skilled OT to address deficits outlined in eval to maximize independence.   OT Frequency 2x / week   OT Duration 8 weeks   OT Treatment/Interventions Self-care/ADL training;Cryotherapy;Moist Heat;Electrical Stimulation;Fluidtherapy;DME and/or AE instruction;Energy conservation;Neuromuscular education;Therapeutic exercise;Functional Mobility Training;Manual Therapy;Passive range of motion;Splinting;Therapeutic activities;Balance training;Patient/family education;Cognitive remediation/compensation   OT Home Exercise Plan issued inital HEP-12/27/14, cane shoulder flexion 01/03/15   Consulted and Agree with Plan of Care Patient;Family member/caregiver   Family Member Consulted wife        Problem List Patient Active Problem List   Diagnosis Date Noted  . Spastic hemiparesis affecting nondominant side 11/08/2014  . Hypothyroidism 10/27/2014  . Left-sided neglect 09/23/2014  . Acute ischemic right MCA stroke 09/16/2014  . Left hemiparesis   . CVA (cerebral vascular  accident)   . Right carotid artery occlusion   . Acute right MCA stroke   . Essential hypertension 09/12/2014  . CVA (cerebral infarction) 09/12/2014  . Type 2 diabetes mellitus with peripheral neuropathy 09/12/2014    Mariah Milling 04/05/2015, 4:56 PM  [facility] [address] [place], [place], [address] Phone: [phone]   Fax:  [phone]

## 2015-04-05 NOTE — Therapy (Signed)
[facility] [address] [place], [place], [address] Phone: [phone]   Fax:  [phone]  Physical Therapy Treatment  Patient Details  Name: Brian Hall MRN: [id number] Date of Birth: 1955/10/14 Referring Provider:  Claretta Fraise, MD  Encounter Date: 04/05/2015      PT End of Session - 04/05/15 1530    Visit Number 30   Number of Visits 49   Date for PT Re-Evaluation 06/04/15   PT Start Time 6789   PT Stop Time 1445   PT Time Calculation (min) 48 min   Equipment Utilized During Treatment Gait belt   Activity Tolerance Patient tolerated treatment well   Behavior During Therapy [facility] for tasks assessed/performed      Past Medical History  Diagnosis Date  . Hypertension   . Diabetes mellitus   . Obesity   . CHF (congestive heart failure)   . SOB (shortness of breath)   . Hyperkalemia   . Renal insufficiency   . Cardiogenic shock   . Cardiac LV ejection fraction 10-20%   . Hyperlipemia   . Hypothyroidism   . Throat cancer 2011    s/p neck dissection, radiation, chemo  . Stroke   . Blurred vision   . Joint pain   . Joint swelling     Past Surgical History  Procedure Laterality Date  . Cardiac catheterization    . Neck dissection Right 2011    s/p resection of throat cancer with multiple nodes removed    There were no vitals filed for this visit.  Visit Diagnosis:  Abnormality of gait - Plan: PT plan of care cert/re-cert  Left spastic hemiplegia - Plan: PT plan of care cert/re-cert  Unsteadiness - Plan: PT plan of care cert/re-cert      Subjective Assessment - 04/05/15 1400    Subjective Pt denies falls. Reports L ankle tingles "a little" but that there's no stinging pain in L ankle anymore.    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  He has a motorcycle  and would like to ride it again.    Currently in Pain? No/denies        Treatment   Postural Assessment Scale for Stroke Patients (PASS)  Give the subject instructions for each item as written below. When scoring the item, record the lowest response category that applies for each item.  Maintaining a Posture  _3_ 1. Sitting Without Support Instructions: Have the subject sit on a bench/mat without back support and with feet flat on the floor. (3) Can sit for 5 minutes without support (2) Can sit for more than 10 seconds without support (1) Can sit with slight support (for example, by 1 hand) (0) Cannot sit  _3_ 2. Standing With Support Instructions: Have the subject stand, providing support as needed. Evaluate only the ability to stand with or without support. Do not consider the quality of the stance. (3) Can stand with support of only 1 hand (2) Can stand with moderate support of 1 person (1) Can stand with strong support of 2 people (0) Cannot stand, even with support  _3_ 3. Standing Without Support Instructions: Have the subject stand without support. Evaluate only the ability to stand with or without support. Do not consider the quality of the stance. (3) Can stand without support for more than  1 minute and simultaneously perform arm movements at about shoulder level (2) Can stand without support for 1 minute or stands slightly asymmetrically  (1) Can stand without support for 10 seconds or leans heavily on 1 leg (0) Cannot stand without support  _1_ 4. Standing on Nonparetic Leg Instructions: Have the subject stand on the nonparetic leg. Evaluate only the ability to bear weight entirely on the nonparetic leg. Do not consider how the subject accomplishes the task. (3) Can stand on nonparetic leg for more than 10 seconds (2) Can stand on nonparetic leg for more than 5 seconds (1) Can stand on nonparetic leg for a few seconds (0) Cannot stand on nonparetic leg  _0_ 5.  Standing on Paretic Leg Instructions: Have the subject stand on the paretic leg. Evaluate only the ability to bear weight entirely on the paretic leg. Do not consider how the subject accomplishes the task. (3) Can stand on paretic leg for more than 10 seconds (2) Can stand on paretic leg for more than 5 seconds (1) Can stand on paretic leg for a few seconds (0) Cannot stand on paretic leg  Maintaining Posture SUBTOTAL ___10/15_____  Changing a Posture  _3_ 6. Supine to Paretic Side Lateral Instructions: Begin with the subject in supine on a treatment mat. Instruct the subject to roll to the paretic side (lateral movement). Assist as necessary. Evaluate the subject's performance on the amount of help required. Do not consider the quality of performance. (3) Can perform without help (2) Can perform with little help (1) Can perform with much help (0) Cannot perform  _3_ 7. Supine to Nonparetic Side Lateral Instructions: Begin with the subject in supine on a treatment mat. Instruct the subject to roll to the nonparetic side (lateral movement). Assist as necessary. Evaluate the subject's performance on the amount of help required. Do not consider the quality of performance. (3) Can perform without help (2) Can perform with little help (1) Can perform with much help (0) Cannot perform  _3_ 8. Supine to Sitting Up on the Edge of the Mat Instructions: Begin with the subject in supine on a treatment mat. Instruct the subject to come to sitting on the edge of the mat. Assist as necessary. Evaluate the subject's performance on the amount of help required. Do not consider the quality of performance. (3) Can perform without help (2) Can perform with little help (1) Can perform with much help (0) Cannot perform  3__ 9. Sitting on the Edge of the Mat to Supine Instructions: Begin with the on the edge of a treatment mat. Instruct the subject to return to supine. Assist as necessary. Evaluate the  subject's performance on the amount of help required. Do not consider the quality of performance. (3) Can perform without help (2) Can perform with little help (1) Can perform with much help (0) Cannot perform  3__ 10. Sitting to Standing Up Instructions: Begin with the subject sitting on the edge of a treatment mat. Instruct the subject to stand up without support. Assist if necessary. Evaluate the subject's performance on the amount of help required. Do not consider the quality of performance. (3) Can perform without help (2) Can perform with little help (1) Can perform with much help (0) Cannot perform  _3_ 11. Standing Up to Sitting Down Instructions: Begin with the subject standing by edge of a treatment mat. Instruct the subject to sit on edge of mat without support. Assist if necessary. Evaluate the subject's performance on the amount  of help required. Do not consider the quality of performance. (3) Can perform without help (2) Can perform with little help (1) Can perform with much help (0) Cannot perform  _2_ 12. Standing, Picking Up a Pencil from the Floor Instructions: Begin with the subject standing. Instruct the subject to pick up a pencil fro the floor without support. Assist if necessary. Evaluate the subject's performance on the amount of help required. Do not consider the quality of performance. (3) Can perform without help (2) Can perform with little help (Min guard due to instability) (1) Can perform with much help (0) Cannot perform  Changing Posture SUBTOTAL __20/21__   TOTAL _30/36____                           PT Education - 04/06/15 0802    Education provided Yes   Education Details Goals, progress, and POC.   Person(s) Educated Patient;Spouse   Methods Explanation   Comprehension Verbalized understanding          PT Short Term Goals - 04/06/15 0813    PT SHORT TERM GOAL #1   Title Pt will perform HEP with supervision of  family, for improved strength and balance. (Target 01/10/15)   Time 4   Period Weeks   Status Achieved   PT SHORT TERM GOAL #2   Title Pt will improve Timed UP and Go score to less than or equal to 35 seconds for decreased fall risk.   Baseline Discontinued due to Timed up and Go Test reinforcing increased velocity of movement, which increases spasticity and decreases quality of movement.   Time 4   Period Weeks   Status Achieved   PT SHORT TERM GOAL #3   Title Berg Balance score to be assessed, with pt scoring at least 5 additional points to decrease fall risk.   Time 4   Period Weeks   Status Achieved  Scored 33 today   PT SHORT TERM GOAL #4   Title Pt will perform at least 6 of 10 reps of sit<>stand transfers no UE support, with equal weightbearing through bilateral lower extremities, independently.   Time 4   Period Weeks   Status Achieved   PT SHORT TERM GOAL #5   Title Pt will improve TUG time from 29.69 sec to 25 seconds for decreased fall risk. Target date: 03/08/15.   Baseline Discontinued due to Timed up and Go Test reinforcing increased velocity of movement, which increases spasticity and decreases quality of movement.   Time 4   Period Weeks   Status Deferred   PT SHORT TERM GOAL #6   Title Pt will ambulate x200' over level surfaces with [facility] and supervision with no overt LOB. Target date: 03/08/15.   Baseline Achieved 03/07/15.   Time 4   Period Weeks   Status Achieved           PT Long Term Goals - 04/05/15 1410    PT LONG TERM GOAL #1   Title Pt will verbalize understanding of fall prevention within home environment. Target 02/10/15   Time 8   Period Weeks   Status Achieved   PT LONG TERM GOAL #2   Title Pt will improve TUG to less than or equal to 25 seconds for decreased fall risk.   Baseline Goal discontinued, as the nature of TUG promotes increased velocity of movement, which increases spasticity and has negative impact on quality of movement.  7/12: 31.87  sec with SBQC   Time 8   Status Deferred  Discontinued; see above.   PT LONG TERM GOAL #3   Title Pt will improve gait velocity to at least 1 ft/sec for improved gait efficiency and safety.   Time 8   Period Weeks   Status Achieved   PT LONG TERM GOAL #4   Title Pt will ambulate at least 50-100 ft using hemiwalker, household distances, with supervision.   Time 8   Period Weeks   Status Achieved   PT LONG TERM GOAL #5   Title Pt will ambulate 150' over outdoor, unlevel surfaces with LRAD and supervision to indicate increased stability/independence with community mobility. Target date: 04/05/15.   Baseline Met 04/03/15 with pt using SBQC, requiring 25% cueing for safe gait velocity.   Time 8   Period Weeks   Status Achieved   Additional Long Term Goals   Additional Long Term Goals Yes   PT LONG TERM GOAL #6   Title Pt will negotiate 4 stairs with single rail and supervision to increase pt independence with community mobility. Target date: 04/05/15.   Baseline Achieved 7/29.   Time 8   Period Weeks   Status Achieved   PT LONG TERM GOAL #7   Title Pt will increase gait speed from 1.02 ft/sec to 1.62 ft/sec to indicate significant improvement in efficiency of ambulation. Modified Target date: 05/03/15   Baseline Continue as STG through renewed POC.   Time 8   Period Weeks   Status Not Met  8/3: 1.47 ft/sec with SBQC   PT LONG TERM GOAL #8   Title Pt will improve Berg score from  42 to 48/56 to indicate decreased fall risk and significant improvement in functional standing balance. Target date: 05/31/15   Status New   PT LONG TERM GOAL  #9   TITLE Pt will improve score on Postural Assessment Scale for Stroke (PASS) from 30/36 to 33/36 to indicate significantly improved ability to maintain stability during positional change.  Target date: 05/31/15   Status New   PT LONG TERM GOAL  #10   TITLE Pt will negotiate standard curb step and standard ramp with mod I using LRAD to indicate increased  safety/independence traversing community obstalces.  Target date: 05/31/15   Status New   PT LONG TERM GOAL  #11   TITLE Pt will ambulate 300' over unlevel paved surfaces with mod I using LRAD to indicate increased stability with limited community mobility.  Target date: 05/31/15.   Status New               Plan - 04/06/15 0818    Clinical Impression Statement Since beginning this episode of outpatient PT, pt has met 5 of 7 LTG's and has consistently demonstrated excellent compliance and motivation. LTG for TUG was deferred due to test promoting increased velocity of movement, which increases spasticity and decreases quality of movement. Goal for self-selected gait speed was not met; however, gait speed has improved considerably. Also of note, Berg Balance Scale score has increased from 21/56 to 42/56 since beginning this episode of PT. Pt will continue to benefit from skilled outpatient PT 2x/week for 8 additional weeks to maximize stability/independence with functional mobility and decrease risk of falling. LTG's added for [employer], community ambulation, and negotiation of community obstalces. Pt/wife verbalized understanding and were in full agreement with POC.   Pt will benefit from skilled therapeutic intervention in order to improve on the following deficits Abnormal gait;Decreased  activity tolerance;Decreased balance;Decreased mobility;Decreased range of motion;Decreased safety awareness;Difficulty walking;Decreased strength;Impaired tone;Decreased knowledge of use of DME;Decreased endurance;Decreased coordination;Impaired flexibility;Impaired perceived functional ability;Postural dysfunction;Impaired sensation;Impaired UE functional use;Impaired vision/preception;Decreased cognition;Pain   Rehab Potential Good   PT Frequency 2x / week   PT Duration 8 weeks   PT Treatment/Interventions ADLs/Self Care Home Management;Therapeutic activities;Functional mobility training;Stair training;Gait  training;Therapeutic exercise;Balance training;Neuromuscular re-education;Patient/family education;DME Instruction;Cognitive remediation;Orthotic Fit/Training;Manual techniques;Passive range of motion;Visual/perceptual remediation/compensation   PT Next Visit Plan Continue to address functional return of L ankle dorsiflexion/eversion (as pt received L tibial nerve block on 7/18). Continue to progress gait; focus on community mobility.   Consulted and Agree with Plan of Care Patient;Family member/caregiver   Family Member Consulted wife, [name]        Problem List Patient Active Problem List   Diagnosis Date Noted  . Spastic hemiparesis affecting nondominant side 11/08/2014  . Hypothyroidism 10/27/2014  . Left-sided neglect 09/23/2014  . Acute ischemic right MCA stroke 09/16/2014  . Left hemiparesis   . CVA (cerebral vascular accident)   . Right carotid artery occlusion   . Acute right MCA stroke   . Essential hypertension 09/12/2014  . CVA (cerebral infarction) 09/12/2014  . Type 2 diabetes mellitus with peripheral neuropathy 09/12/2014    Billie Ruddy, PT, DPT [facility] [address] [place], [place], [address] Phone: [phone]   Fax:  [phone] 04/06/2015, 8:31 AM

## 2015-04-06 ENCOUNTER — Other Ambulatory Visit: Payer: Self-pay | Admitting: Family Medicine

## 2015-04-10 ENCOUNTER — Ambulatory Visit: Payer: 59 | Admitting: Occupational Therapy

## 2015-04-10 ENCOUNTER — Other Ambulatory Visit: Payer: Self-pay | Admitting: Family Medicine

## 2015-04-10 ENCOUNTER — Encounter: Payer: Self-pay | Admitting: Occupational Therapy

## 2015-04-10 ENCOUNTER — Other Ambulatory Visit: Payer: Self-pay | Admitting: Physical Medicine & Rehabilitation

## 2015-04-10 DIAGNOSIS — G8194 Hemiplegia, unspecified affecting left nondominant side: Secondary | ICD-10-CM

## 2015-04-10 DIAGNOSIS — R269 Unspecified abnormalities of gait and mobility: Secondary | ICD-10-CM | POA: Diagnosis not present

## 2015-04-10 DIAGNOSIS — G8114 Spastic hemiplegia affecting left nondominant side: Secondary | ICD-10-CM

## 2015-04-10 DIAGNOSIS — R201 Hypoesthesia of skin: Secondary | ICD-10-CM

## 2015-04-10 DIAGNOSIS — Z7409 Other reduced mobility: Secondary | ICD-10-CM

## 2015-04-10 DIAGNOSIS — R4189 Other symptoms and signs involving cognitive functions and awareness: Secondary | ICD-10-CM

## 2015-04-10 NOTE — Therapy (Signed)
[facility] [address] [place], [place], [address] Phone: [phone]   Fax:  [phone]  Occupational Therapy Treatment  Patient Details  Name: Brian Hall MRN: [id number] Date of Birth: 11/13/1955 Referring Provider:  Claretta Fraise, MD  Encounter Date: 04/10/2015      OT End of Session - 04/10/15 1707    Visit Number 31   Number of Visits 32   Date for OT Re-Evaluation 04/11/15   Authorization Type [employer]   OT Start Time 1315   OT Stop Time 1359   OT Time Calculation (min) 44 min   Activity Tolerance Patient tolerated treatment well      Past Medical History  Diagnosis Date  . Hypertension   . Diabetes mellitus   . Obesity   . CHF (congestive heart failure)   . SOB (shortness of breath)   . Hyperkalemia   . Renal insufficiency   . Cardiogenic shock   . Cardiac LV ejection fraction 10-20%   . Hyperlipemia   . Hypothyroidism   . Throat cancer 2011    s/p neck dissection, radiation, chemo  . Stroke   . Blurred vision   . Joint pain   . Joint swelling     Past Surgical History  Procedure Laterality Date  . Cardiac catheterization    . Neck dissection Right 2011    s/p resection of throat cancer with multiple nodes removed    There were no vitals filed for this visit.  Visit Diagnosis:  Hemiplegia affecting left nondominant side  Left spastic hemiplegia  Impaired sensation  Impaired cognition  Impaired functional mobility and activity tolerance      Subjective Assessment - 04/10/15 1321    Subjective  I am using my arm more at home.   Patient is accompained by: Family member  wife   Pertinent History see epic snapshot   Patient Stated Goals Get back to doing some of the things I used to do.   Currently in Pain? Yes   Pain Score 1    Pain Location Ankle   Pain Orientation Left   Pain Descriptors / Indicators Burning   Pain Type Chronic pain   Pain Onset 1 to 4 weeks ago   Pain Frequency Intermittent   Aggravating Factors  walking alot, leaving brace on too long   Pain Relieving Factors taking brace off, resting, stretching   Multiple Pain Sites No                      OT Treatments/Exercises (OP) - 04/10/15 0001    Neurological Re-education Exercises   Other Exercises 1 Neuro re ed to address low to mid bilateral reach with grasp incoporated. Pt able to grasp once hand is placed on object and complete low reach with no assistance and mid reach with min faciliation and min compensations. Pt requires facilitation, mod vc's and repetition due to sensory issues, neglect and cognitive deficits. Pt and wife contine to be consistently work at [employer].  Pt reports he can now complete toilet transfers mod I.                   OT Short Term Goals - 04/10/15 1704    OT SHORT TERM GOAL #1   Title Pt and wife will be mod I with HEP - 01/18/2015.  Goal dates adjusted as pt will not return to clinic prior to 12/21/14   Status Achieved   OT SHORT  TERM GOAL #2   Title Pt will require min a for LB dressing   Status Achieved   OT SHORT TERM GOAL #3   Title Pt will demonstrate ability to use LUE as gross assist during basic self care 50% of the time.   Status Partially Met   OT SHORT TERM GOAL #4   Title Pt will need no more than min vc's to compensate for L neglect during functional mobility tasks   Status Achieved   OT SHORT TERM GOAL #5   Title Pt will demonstrate ability to attend to familiar functional task for 15 minute with no more than one vc.   Status Achieved   OT SHORT TERM GOAL #6   Title Pt will require no more than min assist for toilet transfers   Status Achieved   OT SHORT TERM GOAL #7   Title Pt and wife will be mod I with upgraded HEP prn - 03/14/2015   Status Achieved   OT SHORT TERM GOAL #8   Title Pt will be able to use LUE as gross assist 75% of the time for basic ADL tasks with min cues   Status Achieved   OT SHORT TERM GOAL   #9   TITLE Pt will require supervision for tub bench transfers    Status Deferred  pt is now stepping over ledge and into shower wtih min with wife to sit on seat   OT SHORT TERM GOAL  #10   TITLE Pt will require min a for simple snack prep at ambulatory level   Status Achieved           OT Long Term Goals - 04/10/15 1704    OT LONG TERM GOAL #1   Status Achieved   OT LONG TERM GOAL #2   Status Not Met   OT LONG TERM GOAL #3   Status Achieved   OT LONG TERM GOAL #4   Status Achieved   OT LONG TERM GOAL #5   Status Achieved   OT LONG TERM GOAL #6   Status Achieved   OT LONG TERM GOAL #7   Status Deferred   OT LONG TERM GOAL #8   Title Pt will demonstrate the ability to use LUE as gross a for basic ADL's 100% of the time - 04/11/2015   Status Partially Met  able to use for bathing   OT LONG TERM GOAL  #9   Baseline Pt will be mod I for toilet transfers   Status Achieved   OT LONG TERM GOAL  #10   TITLE Pt will be supervision for simple hot meal prep   Status Not Met   OT LONG TERM GOAL  #11   TITLE Pt will demonstrate ability for blilateral mid reach for functional tasks   Status Partially Met  with min facilitation and min compensations               Plan - 04/10/15 1706    Clinical Impression Statement Pt progressing toward goals and has met or partially met all LTG's. Pt continues to make slow steady progress. Plan to renew pt with additional goals related to UE use, functional mobilty and IADL's   Pt will benefit from skilled therapeutic intervention in order to improve on the following deficits (Retired) Decreased balance;Decreased coordination;Decreased cognition;Decreased knowledge of use of DME;Decreased mobility;Decreased range of motion;Decreased safety awareness;Difficulty walking;Decreased strength;Increased edema;Increased muscle spasms;Impaired UE functional use;Impaired tone;Impaired sensation;Impaired vision/preception;Pain   Rehab Potential Good  Clinical Impairments Affecting Rehab Potential Pt will benefit from skilled OT to address deficits outlined in eval to maximize independence.   OT Frequency 2x / week   OT Duration 8 weeks   OT Treatment/Interventions Self-care/ADL training;Cryotherapy;Moist Heat;Electrical Stimulation;Fluidtherapy;DME and/or AE instruction;Energy conservation;Neuromuscular education;Therapeutic exercise;Functional Mobility Training;Manual Therapy;Passive range of motion;Splinting;Therapeutic activities;Balance training;Patient/family education;Cognitive remediation/compensation   Plan additional cooking task, functional balance and use of UE   Consulted and Agree with Plan of Care Patient;Family member/caregiver   Family Member Consulted wife        Problem List Patient Active Problem List   Diagnosis Date Noted  . Spastic hemiparesis affecting nondominant side 11/08/2014  . Hypothyroidism 10/27/2014  . Left-sided neglect 09/23/2014  . Acute ischemic right MCA stroke 09/16/2014  . Left hemiparesis   . CVA (cerebral vascular accident)   . Right carotid artery occlusion   . Acute right MCA stroke   . Essential hypertension 09/12/2014  . CVA (cerebral infarction) 09/12/2014  . Type 2 diabetes mellitus with peripheral neuropathy 09/12/2014    Quay Burow, OTR/L 04/10/2015, 5:08 PM  [facility] [address] [place], [place], [address] Phone: [phone]   Fax:  [phone]

## 2015-04-11 ENCOUNTER — Ambulatory Visit: Payer: 59 | Admitting: Physical Therapy

## 2015-04-11 ENCOUNTER — Encounter: Payer: Self-pay | Admitting: Occupational Therapy

## 2015-04-11 ENCOUNTER — Ambulatory Visit: Payer: 59 | Admitting: Occupational Therapy

## 2015-04-11 ENCOUNTER — Ambulatory Visit (INDEPENDENT_AMBULATORY_CARE_PROVIDER_SITE_OTHER): Payer: 59 | Admitting: Family Medicine

## 2015-04-11 VITALS — BP 128/73 | HR 52 | Temp 97.5°F | Wt 240.8 lb

## 2015-04-11 DIAGNOSIS — G8114 Spastic hemiplegia affecting left nondominant side: Secondary | ICD-10-CM

## 2015-04-11 DIAGNOSIS — E785 Hyperlipidemia, unspecified: Secondary | ICD-10-CM | POA: Diagnosis not present

## 2015-04-11 DIAGNOSIS — I10 Essential (primary) hypertension: Secondary | ICD-10-CM

## 2015-04-11 DIAGNOSIS — I639 Cerebral infarction, unspecified: Secondary | ICD-10-CM

## 2015-04-11 DIAGNOSIS — E1140 Type 2 diabetes mellitus with diabetic neuropathy, unspecified: Secondary | ICD-10-CM | POA: Diagnosis not present

## 2015-04-11 DIAGNOSIS — E1142 Type 2 diabetes mellitus with diabetic polyneuropathy: Secondary | ICD-10-CM

## 2015-04-11 DIAGNOSIS — G8194 Hemiplegia, unspecified affecting left nondominant side: Secondary | ICD-10-CM

## 2015-04-11 DIAGNOSIS — R269 Unspecified abnormalities of gait and mobility: Secondary | ICD-10-CM

## 2015-04-11 DIAGNOSIS — R4189 Other symptoms and signs involving cognitive functions and awareness: Secondary | ICD-10-CM

## 2015-04-11 DIAGNOSIS — R201 Hypoesthesia of skin: Secondary | ICD-10-CM

## 2015-04-11 DIAGNOSIS — R2681 Unsteadiness on feet: Secondary | ICD-10-CM

## 2015-04-11 LAB — POCT GLYCOSYLATED HEMOGLOBIN (HGB A1C): Hemoglobin A1C: 5.2

## 2015-04-11 NOTE — Progress Notes (Signed)
Subjective:  Patient ID: Brian Hall, male    DOB: [birth year]  Age: 59 y.o. MRN: [id number]  CC: Diabetes; Hypertension; Hyperlipidemia; Gastrophageal Reflux; and Hypothyroidism   HPI Brian Hall presents for  follow-up of hypertension. Patient has no history of headache chest pain or shortness of breath or recent cough. 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 He is currently in rehabilitation for that. He is doing home exercises. He is able to walk up to 100 feet with a walker. He walked up a flight of stairs several days ago. He has some motion of the left upper extremity but cannot use the hand and is wearing a wrist brace for support.. Patient checks  blood pressure at home and has not had any elevated readings recently. Patient denies side effects from his medication. States taking it regularly.  Patient also  in for follow-up of elevated cholesterol. Doing well without complaints on current medication. Denies side effects of statin including myalgia and arthralgia and nausea. Also in today for liver function testing. Currently no chest pain, shortness of breath or other cardiovascular related symptoms noted.  Follow-up of diabetes. Patient does check blood sugar at home. Readings run between 75 and 140 Patient denies symptoms such as polyuria, polydipsia, excessive hunger, nausea No significant hypoglycemic spells noted. Medications as noted below. Taking them regularly without complication/adverse reaction being reported today.    History Brian Hall has a past medical history of Hypertension; Diabetes mellitus; Obesity; CHF (congestive heart failure); SOB (shortness of breath); Hyperkalemia; Renal insufficiency; Cardiogenic shock; Cardiac LV ejection fraction 10-20%; Hyperlipemia; Hypothyroidism; Throat cancer (2011); Stroke; Blurred vision; Joint pain; and Joint swelling.   He has past surgical history that includes  Cardiac catheterization and Neck dissection (Right, 2011).   His family history includes Hypertension in his father and mother; Stroke in his father and sister.He reports that he has never smoked. He does not have any smokeless tobacco history on file. He reports that he does not drink alcohol or use illicit drugs.  Current Outpatient Prescriptions on File Prior to Visit  Medication Sig Dispense Refill  . carvedilol (COREG) 12.5 MG tablet Take 1 tablet (12.5 mg total) by mouth 2 (two) times daily. 60 tablet 1  . cholecalciferol (VITAMIN D) 400 UNITS TABS tablet Take 400 Units by mouth daily.    . clopidogrel (PLAVIX) 75 MG tablet take 1 tablet by mouth daily 30 tablet 2  . glimepiride (AMARYL) 2 MG tablet take 1 tablet by mouth daily WITH BREAKFAST 30 tablet 2  . JANUVIA 100 MG tablet take 1 tablet by mouth daily 30 tablet 2  . levothyroxine (SYNTHROID, LEVOTHROID) 88 MCG tablet take 1 tablet by mouth once daily daily BEFORE BREAKFAST 30 tablet 6  . lisinopril (PRINIVIL,ZESTRIL) 40 MG tablet take 1 tablet by mouth once daily 30 tablet 5  . Multiple Vitamins-Minerals (MULTIVITAMIN WITH MINERALS) tablet Take 1 tablet by mouth daily.    . nebivolol (BYSTOLIC) 10 MG tablet Take 1 tablet (10 mg total) by mouth daily. 30 tablet 5  . omeprazole (PRILOSEC) 40 MG capsule Take 1 capsule (40 mg total) by mouth daily. 30 capsule 1  . ONE TOUCH ULTRA TEST test strip   1  . pravastatin (PRAVACHOL) 40 MG tablet take 1 tablet by mouth once daily 30 tablet 5  . tiZANidine (ZANAFLEX) 2 MG tablet take 1 tablet by mouth three times a day 90 tablet 1  . traMADol (  ULTRAM) 50 MG tablet Take 1 tablet (50 mg total) by mouth every 6 (six) hours as needed for moderate pain. 60 tablet 0   No current facility-administered medications on file prior to visit.    ROS Review of Systems  Objective:  BP 128/73 mmHg  Pulse 52  Temp(Src) 97.5 F (36.4 C) (Oral)  Wt 240 lb 12.8 oz (109.226 kg)  BP Readings from Last 3  Encounters:  04/11/15 128/73  03/20/15 116/64  02/14/15 110/67    Wt Readings from Last 3 Encounters:  04/11/15 240 lb 12.8 oz (109.226 kg)  01/12/15 234 lb 3.2 oz (106.232 kg)  01/09/15 236 lb 12.8 oz (107.412 kg)     Physical Exam  Lab Results  Component Value Date   HGBA1C 5.2 04/11/2015   HGBA1C 5.1 01/09/2015   HGBA1C 7.7* 09/12/2014    Lab Results  Component Value Date   WBC 7.8 10/27/2014   HGB 14.0* 10/27/2014   HCT 43.6 10/27/2014   PLT 161 09/19/2014   GLUCOSE 71 10/27/2014   CHOL 162 11/28/2014   TRIG 278* 11/28/2014   HDL 25* 11/28/2014   LDLCALC 88 09/12/2014   ALT 15 10/27/2014   AST 15 10/27/2014   NA 144 10/27/2014   K 5.0 10/27/2014   CL 106 10/27/2014   CREATININE 1.15 10/27/2014   BUN 19 10/27/2014   CO2 24 10/27/2014   TSH 3.690 10/27/2014   INR 0.95 09/12/2014   HGBA1C 5.2 04/11/2015    No results found.  Assessment & Plan:   Brian Hall was seen today for diabetes, hypertension, hyperlipidemia, gastrophageal reflux and hypothyroidism.  Diagnoses and all orders for this visit:  Type 2 diabetes mellitus with peripheral neuropathy Orders: -     POCT glycosylated hemoglobin (Hb A1C); Standing -     CMP14+EGFR; Standing -     POCT glycosylated hemoglobin (Hb A1C) -     CMP14+EGFR -     CBC with Differential/Platelet  Essential hypertension Orders: -     Cancel: POCT CBC; Standing -     Cancel: POCT CBC -     CMP14+EGFR -     CBC with Differential/Platelet  CVA (cerebral vascular accident) Orders: -     Cancel: POCT CBC -     CBC with Differential/Platelet  Hyperlipemia Orders: -     CMP14+EGFR; Standing -     Lipid panel; Standing -     Lipid panel -     CBC with Differential/Platelet   I am having Brian Hall maintain his multivitamin with minerals, cholecalciferol, carvedilol, omeprazole, nebivolol, ONE TOUCH ULTRA TEST, traMADol, lisinopril, pravastatin, levothyroxine, glimepiride, clopidogrel, JANUVIA, and  tiZANidine.  No orders of the defined types were placed in this encounter.   I encouraged him to continue formal rehabilitation as long as possible. However he may need to transition to home exercise program soon. This is based on benefit availability.  Follow-up: Return in about 3 months (around 07/12/2015).  Claretta Fraise, M.D.

## 2015-04-11 NOTE — Therapy (Signed)
[facility] [address] [place], [place], [address] Phone: [phone]   Fax:  [phone]  Occupational Therapy Treatment  Patient Details  Name: Brian Hall MRN: [id number] Date of Birth: 01-01-1956 Referring Provider:  Claretta Fraise, MD  Encounter Date: 04/11/2015      OT End of Session - 04/11/15 1733    Visit Number 32   Number of Visits 19   Date for OT Re-Evaluation 06/06/15   Authorization Type [employer]   OT Start Time 1446   OT Stop Time 1530   OT Time Calculation (min) 44 min   Activity Tolerance Patient tolerated treatment well      Past Medical History  Diagnosis Date  . Hypertension   . Diabetes mellitus   . Obesity   . CHF (congestive heart failure)   . SOB (shortness of breath)   . Hyperkalemia   . Renal insufficiency   . Cardiogenic shock   . Cardiac LV ejection fraction 10-20%   . Hyperlipemia   . Hypothyroidism   . Throat cancer 2011    s/p neck dissection, radiation, chemo  . Stroke   . Blurred vision   . Joint pain   . Joint swelling     Past Surgical History  Procedure Laterality Date  . Cardiac catheterization    . Neck dissection Right 2011    s/p resection of throat cancer with multiple nodes removed    There were no vitals filed for this visit.  Visit Diagnosis:  Hemiplegia affecting left nondominant side - Plan: Ot plan of care cert/re-cert  Left spastic hemiplegia - Plan: Ot plan of care cert/re-cert  Impaired sensation - Plan: Ot plan of care cert/re-cert  Impaired cognition - Plan: Ot plan of care cert/re-cert      Subjective Assessment - 04/11/15 1453    Subjective  I see the rehab dr tomorrow   Patient is accompained by: Family member  wife   Pertinent History see epic snapshot   Patient Stated Goals Get back to doing some of the things I used to do.   Currently in Pain? Yes   Pain Score 1    Pain Location Ankle   Pain Orientation Left   Pain  Descriptors / Indicators Burning   Pain Type Chronic pain   Pain Onset More than a month ago   Pain Frequency Intermittent   Aggravating Factors  walking alot, leaving brace on too long   Pain Relieving Factors taking brace off,resting, stretching                      OT Treatments/Exercises (OP) - 04/11/15 0001    Neurological Re-education Exercises   Other Exercises 1 Following Estim attempted active finger extension. Pt is able to "turn off" finger flexors but only trace finger extension with facilitation. Pt with tone increase with effort. Discussed with pt possible consideration for botox injection and pt and wife in agreement to discuss with MD at tomorrow's appt. Emailed MD to provide input regarding clinical goals with pt's permission.   Chief of Staff Action E stim to address wrist extension with finger extension asking pt to try and assist with finger extension to grasp object. Pt tolerated well.   Electrical Stimulation Parameters 50 pps, symmetrical, pulse 250, intensity 35-49, 10 sec cycle, 2 sec ramp   Electrical Stimulation Goals Tone;Neuromuscular facilitation  OT Short Term Goals - 04/11/15 1602    OT SHORT TERM GOAL #1   Title Pt and wife will be mod I with HEP - 01/18/2015.  Goal dates adjusted as pt will not return to clinic prior to 12/21/14   Status Achieved   OT SHORT TERM GOAL #2   Title Pt will require min a for LB dressing   Status Achieved   OT SHORT TERM GOAL #3   Title Pt will demonstrate ability to use LUE as gross assist during basic self care 50% of the time.   Status Partially Met   OT SHORT TERM GOAL #4   Title Pt will need no more than min vc's to compensate for L neglect during functional mobility tasks   Status Achieved   OT SHORT TERM GOAL #5   Title Pt will demonstrate ability to attend to familiar functional task for 15 minute  with no more than one vc.   Status Achieved   Additional Short Term Goals   Additional Short Term Goals Yes   OT SHORT TERM GOAL #6   Title Pt will require no more than min assist for toilet transfers   Status Achieved   OT SHORT TERM GOAL #7   Title Pt and wife will be mod I with upgraded HEP prn - 03/14/2015   Status Achieved   OT SHORT TERM GOAL #8   Title Pt will be able to use LUE as gross assist 75% of the time for basic ADL tasks with min cues   Status Achieved   OT SHORT TERM GOAL  #9   TITLE Pt will require supervision for tub bench transfers    Status Deferred  pt is now stepping over ledge and into shower wtih min with wife to sit on seat   OT SHORT TERM GOAL  #10   TITLE Pt will require min a for simple snack prep at ambulatory level   Status Achieved   OT SHORT TERM GOAL  #11   TITLE Pt and wife will be mod I with upgraded HEP - 05/09/2015   Status New   OT SHORT TERM GOAL  #12   TITLE Pt will be supevision for simple hot meal prep   Status New   OT SHORT TERM GOAL  #13   TITLE Pt will demonstrate ability for bilateral mid reach with light object without facilitation.   Status New           OT Long Term Goals - 04/11/15 1603    OT LONG TERM GOAL #1   Status Achieved   OT LONG TERM GOAL #2   Status Not Met   OT LONG TERM GOAL #3   Status Achieved   OT LONG TERM GOAL #4   Status Achieved   OT LONG TERM GOAL #5   Status Achieved   OT LONG TERM GOAL #6   Status Achieved   OT LONG TERM GOAL #7   Status Deferred   OT LONG TERM GOAL #8   Title Pt will demonstrate the ability to use LUE as gross a for basic ADL's 100% of the time - 06/06/2015   Status On-going  able to use for bathing   OT LONG TERM GOAL  #9   Baseline Pt will be mod I for toilet transfers   Status Achieved   OT LONG TERM GOAL  #10   TITLE Pt will be supervision  for simple hot meal prep   Status Not Met  OT LONG TERM GOAL  #11   TITLE Pt will demonstrate ability for blilateral mid reach  for functional tasks without compensations   Status On-going  with min facilitation and min compensations   OT LONG TERM GOAL  #12   TITLE Pt will demonstrate ability to release objects with mod facilitation.    Status New   OT LONG TERM GOAL  #13   TITLE Pt will demonstrate ability for unilateral low reach with assist for gross grasp with mn compensations.   Status New   OT LONG TERM GOAL  #14   TITLE Pt will be mod I with upgraded HEP prn   Status New               Plan - 04/11/15 1729    Clinical Impression Statement Pt continues to progress and has met 9/9 STG's and 5/8 LTG's. Pt and wife are extremely motivated and actively work toward goals at home. Discussed possibility of botox injection to LUE and pt and wife to discuss with MD tomorrow.    Pt will benefit from skilled therapeutic intervention in order to improve on the following deficits (Retired) Decreased balance;Decreased coordination;Decreased cognition;Decreased knowledge of use of DME;Decreased mobility;Decreased range of motion;Decreased safety awareness;Difficulty walking;Decreased strength;Increased edema;Increased muscle spasms;Impaired UE functional use;Impaired tone;Impaired sensation;Impaired vision/preception;Pain   Rehab Potential Good   Clinical Impairments Affecting Rehab Potential Pt will benefit from skilled OT to address deficits outlined in eval to maximize independence.   OT Frequency 2x / week   OT Duration 8 weeks   OT Treatment/Interventions Self-care/ADL training;Cryotherapy;Moist Heat;Electrical Stimulation;Fluidtherapy;DME and/or AE instruction;Energy conservation;Neuromuscular education;Therapeutic exercise;Functional Mobility Training;Manual Therapy;Passive range of motion;Splinting;Therapeutic activities;Balance training;Patient/family education;Cognitive remediation/compensation   Plan upgrade HEP prn, NMR to LUE.    Consulted and Agree with Plan of Care Patient;Family member/caregiver    Family Member Consulted wife        Problem List Patient Active Problem List   Diagnosis Date Noted  . Spastic hemiparesis affecting nondominant side 11/08/2014  . Hypothyroidism 10/27/2014  . Left-sided neglect 09/23/2014  . Acute ischemic right MCA stroke 09/16/2014  . Left hemiparesis   . CVA (cerebral vascular accident)   . Right carotid artery occlusion   . Acute right MCA stroke   . Essential hypertension 09/12/2014  . CVA (cerebral infarction) 09/12/2014  . Type 2 diabetes mellitus with peripheral neuropathy 09/12/2014    Quay Burow, OTR/L 04/11/2015, 5:46 PM  [facility] [address] [place], [place], [address] Phone: [phone]   Fax:  [phone]

## 2015-04-11 NOTE — Therapy (Signed)
[facility] [address] [place], [place], [address] Phone: [phone]   Fax:  [phone]  Physical Therapy Treatment  Patient Details  Name: Brian Hall MRN: [id number] Date of Birth: September 19, 1955 Referring Provider:  Charlett Blake, MD  Encounter Date: 04/11/2015      PT End of Session - 04/11/15 1651    Visit Number 31   Number of Visits 84   Date for PT Re-Evaluation 06/04/15   PT Start Time 5573   PT Stop Time 1631   PT Time Calculation (min) 46 min   Activity Tolerance Patient tolerated treatment well   Behavior During Therapy [facility] for tasks assessed/performed      Past Medical History  Diagnosis Date  . Hypertension   . Diabetes mellitus   . Obesity   . CHF (congestive heart failure)   . SOB (shortness of breath)   . Hyperkalemia   . Renal insufficiency   . Cardiogenic shock   . Cardiac LV ejection fraction 10-20%   . Hyperlipemia   . Hypothyroidism   . Throat cancer 2011    s/p neck dissection, radiation, chemo  . Stroke   . Blurred vision   . Joint pain   . Joint swelling     Past Surgical History  Procedure Laterality Date  . Cardiac catheterization    . Neck dissection Right 2011    s/p resection of throat cancer with multiple nodes removed    There were no vitals filed for this visit.  Visit Diagnosis:  Abnormality of gait  Unsteadiness  Left spastic hemiplegia      Subjective Assessment - 04/11/15 1640    Subjective "Last weekend, we went to a [place] with out friends and I went up and down 5 steps like it was nothin'. Nobody had to help me." Pt/wife report pt has been ambulating into house from car, leaving w/c in car more frequently as pt is ambulating more in the community. No falls to report.   Patient is accompained by: Family member   Pertinent History CVA with L hemiparesis 09/2014   Patient Stated Goals Pt's goal is to get back to walking and using L hand  again.  He has a motorcycle and would like to ride it again.    Currently in Pain? Yes   Pain Score 1    Pain Location Ankle   Pain Orientation Left   Pain Descriptors / Indicators Burning   Pain Type Chronic pain   Pain Onset More than a month ago   Pain Frequency Intermittent   Aggravating Factors  walking a lot; leaving brace on too long   Pain Relieving Factors taking brace off, stretching   Multiple Pain Sites No            [facility] PT Assessment - 04/11/15 0001    Strength   Right/Left Ankle Left   Left Ankle Dorsiflexion 3-/5   Left Ankle Plantar Flexion 4/5   Left Ankle Inversion 4/5   Left Ankle Eversion 2-/5                     [facility] Adult PT Treatment/Exercise - 04/11/15 0001    Neuro Re-ed    Neuro Re-ed Details  For tone reduction, performed prolonged passive manual stretch of L gastrocnemius x3 minutes total with pt in semi reclined. Seated EOM with L foot on round side of Bosu, performed prolonged passive stretch of L soleus with manual  assist for stretch of L great toe flexors x3 minutes total. With NMES on L peroneals for L subtalar eversion, performed L half kneeling (L foot in front) on mat with intermittent UE support to focus on muscular activation at L knee/ankle. Facilitation provided at L knee to decrease spasticity of L hip adductors. Cueing focused on upright posture, LLE weightbearing. Pt able to tolerate L half kneeling x5 minutes, then x3.5 minutes. For active rest beak, performed tall kneeling with RUE support at physioball for increased proximal stability, selective control at LLE.                PT Education - 04/11/15 1643    Education provided Yes   Education Details stretch for L great toe flexor   Person(s) Educated Patient;Spouse   Methods Explanation;Demonstration   Comprehension Verbalized understanding;Returned demonstration          PT Short Term Goals - 04/11/15 1658    PT SHORT TERM GOAL #1   Title Pt will  perform HEP with supervision of family, for improved strength and balance. (Target 01/10/15)   Time 4   Period Weeks   Status Achieved   PT SHORT TERM GOAL #2   Title Pt will improve Timed UP and Go score to less than or equal to 35 seconds for decreased fall risk.   Baseline Discontinued due to Timed up and Go Test reinforcing increased velocity of movement, which increases spasticity and decreases quality of movement.   Time 4   Period Weeks   Status Achieved   PT SHORT TERM GOAL #3   Title Berg Balance score to be assessed, with pt scoring at least 5 additional points to decrease fall risk.   Time 4   Period Weeks   Status Achieved  Scored 33 today   PT SHORT TERM GOAL #4   Title Pt will perform at least 6 of 10 reps of sit<>stand transfers no UE support, with equal weightbearing through bilateral lower extremities, independently.   Time 4   Period Weeks   Status Achieved   PT SHORT TERM GOAL #5   Title Pt will improve TUG time from 29.69 sec to 25 seconds for decreased fall risk. Target date: 03/08/15.   Baseline Discontinued due to Timed up and Go Test reinforcing increased velocity of movement, which increases spasticity and decreases quality of movement.   Time 4   Period Weeks   Status Deferred   PT SHORT TERM GOAL #6   Title Pt will ambulate x200' over level surfaces with [facility] and supervision with no overt LOB. Target date: 03/08/15.   Baseline Achieved 03/07/15.   Time 4   Period Weeks   Status Achieved           PT Long Term Goals - 04/11/15 1659    PT LONG TERM GOAL #1   Title Pt will verbalize understanding of fall prevention within home environment. Target 02/10/15   Time 8   Period Weeks   Status Achieved   PT LONG TERM GOAL #2   Title Pt will improve TUG to less than or equal to 25 seconds for decreased fall risk.   Baseline Goal discontinued, as the nature of TUG promotes increased velocity of movement, which increases spasticity and has negative impact on  quality of movement.  7/12: 31.87 sec with SBQC   Time 8   Status Deferred  Discontinued; see above.   PT LONG TERM GOAL #3   Title Pt will improve gait  velocity to at least 1 ft/sec for improved gait efficiency and safety.   Time 8   Period Weeks   Status Achieved   PT LONG TERM GOAL #4   Title Pt will ambulate at least 50-100 ft using hemiwalker, household distances, with supervision.   Time 8   Period Weeks   Status Achieved   PT LONG TERM GOAL #5   Title Pt will ambulate 150' over outdoor, unlevel surfaces with LRAD and supervision to indicate increased stability/independence with community mobility. Target date: 04/05/15.   Baseline Met 04/03/15 with pt using SBQC, requiring 25% cueing for safe gait velocity.   Time 8   Period Weeks   Status Achieved   PT LONG TERM GOAL #6   Title Pt will negotiate 4 stairs with single rail and supervision to increase pt independence with community mobility. Target date: 04/05/15.   Baseline Achieved 7/29.   Time 8   Period Weeks   Status Achieved   PT LONG TERM GOAL #7   Title Pt will increase gait speed from 1.02 ft/sec to 1.62 ft/sec to indicate significant improvement in efficiency of ambulation. Modified Target date: 05/03/15   Baseline Continue as STG through renewed POC.   Time 8   Period Weeks   Status On-going  8/3: 1.47 ft/sec with SBQC   PT LONG TERM GOAL #8   Title Pt will improve Berg score from  42 to 48/56 to indicate decreased fall risk and significant improvement in functional standing balance. Target date: 05/31/15   Status On-going   PT LONG TERM GOAL  #9   TITLE Pt will improve score on Postural Assessment Scale for Stroke (PASS) from 30/36 to 33/36 to indicate significantly improved ability to maintain stability during positional change.  Target date: 05/31/15   Status On-going   PT LONG TERM GOAL  #10   TITLE Pt will negotiate standard curb step and standard ramp with mod I using LRAD to indicate increased  safety/independence traversing community obstalces.  Target date: 05/31/15   Status On-going   PT LONG TERM GOAL  #11   TITLE Pt will ambulate 300' over unlevel paved surfaces with mod I using LRAD to indicate increased stability with limited community mobility.  Target date: 05/31/15.   Status On-going               Plan - 04/11/15 1653    Clinical Impression Statement Focused on reducing spasticity in L ankle plantarflexors, L great toe flexors; promoting functional return of L ankle dorsiflexion without supination to facilitate more normalized gait pattern. Current session was the first during which pt was able to perform active L ankle dorsiflexion against gravity. Moreover, dorsiflexion less dominated by L forefoot supination, toe flexion. Pt able to tolerate L half kneeling for up to 5 minutes during this session. Continue per POC.   Pt will benefit from skilled therapeutic intervention in order to improve on the following deficits Abnormal gait;Decreased activity tolerance;Decreased balance;Decreased mobility;Decreased range of motion;Decreased safety awareness;Difficulty walking;Decreased strength;Impaired tone;Decreased knowledge of use of DME;Decreased endurance;Decreased coordination;Impaired flexibility;Impaired perceived functional ability;Postural dysfunction;Impaired sensation;Impaired UE functional use;Impaired vision/preception;Decreased cognition;Pain   Rehab Potential Good   PT Frequency 2x / week   PT Duration 8 weeks   PT Treatment/Interventions ADLs/Self Care Home Management;Therapeutic activities;Functional mobility training;Stair training;Gait training;Therapeutic exercise;Balance training;Neuromuscular re-education;Patient/family education;DME Instruction;Cognitive remediation;Orthotic Fit/Training;Manual techniques;Passive range of motion;Visual/perceptual remediation/compensation   PT Next Visit Plan Continue to address functional return of L ankle  dorsiflexion/eversion. Prolonged passive stretch of  L ankle plantarflexors, great toe flexors. L half kneeling for activation of distal LLE musculature.   Consulted and Agree with Plan of Care Patient;Family member/caregiver   Family Member Consulted wife, [name]        Problem List Patient Active Problem List   Diagnosis Date Noted  . Spastic hemiparesis affecting nondominant side 11/08/2014  . Hypothyroidism 10/27/2014  . Left-sided neglect 09/23/2014  . Acute ischemic right MCA stroke 09/16/2014  . Left hemiparesis   . CVA (cerebral vascular accident)   . Right carotid artery occlusion   . Acute right MCA stroke   . Essential hypertension 09/12/2014  . CVA (cerebral infarction) 09/12/2014  . Type 2 diabetes mellitus with peripheral neuropathy 09/12/2014    Billie Ruddy, PT, DPT [facility] [address] [place], [place], [address] Phone: [phone]   Fax:  [phone] 04/11/2015, 5:05 PM

## 2015-04-12 LAB — CBC WITH DIFFERENTIAL/PLATELET
BASOS: 0 %
Basophils Absolute: 0 10*3/uL (ref 0.0–0.2)
EOS (ABSOLUTE): 0.2 10*3/uL (ref 0.0–0.4)
EOS: 2 %
HEMOGLOBIN: 13.8 g/dL (ref 12.6–17.7)
Hematocrit: 40.9 % (ref 37.5–51.0)
IMMATURE GRANS (ABS): 0 10*3/uL (ref 0.0–0.1)
Immature Granulocytes: 0 %
Lymphocytes Absolute: 1.3 10*3/uL (ref 0.7–3.1)
Lymphs: 17 %
MCH: 31.7 pg (ref 26.6–33.0)
MCHC: 33.7 g/dL (ref 31.5–35.7)
MCV: 94 fL (ref 79–97)
MONOCYTES: 8 %
MONOS ABS: 0.6 10*3/uL (ref 0.1–0.9)
NEUTROS PCT: 73 %
Neutrophils Absolute: 5.5 10*3/uL (ref 1.4–7.0)
Platelets: 168 10*3/uL (ref 150–379)
RBC: 4.35 x10E6/uL (ref 4.14–5.80)
RDW: 14.3 % (ref 12.3–15.4)
WBC: 7.6 10*3/uL (ref 3.4–10.8)

## 2015-04-12 LAB — CMP14+EGFR
ALBUMIN: 4 g/dL (ref 3.5–5.5)
ALK PHOS: 56 IU/L (ref 39–117)
ALT: 12 IU/L (ref 0–44)
AST: 13 IU/L (ref 0–40)
Albumin/Globulin Ratio: 1.5 (ref 1.1–2.5)
BUN / CREAT RATIO: 18 (ref 9–20)
BUN: 24 mg/dL (ref 6–24)
Bilirubin Total: 0.6 mg/dL (ref 0.0–1.2)
CO2: 22 mmol/L (ref 18–29)
Calcium: 9.7 mg/dL (ref 8.7–10.2)
Chloride: 103 mmol/L (ref 97–108)
Creatinine, Ser: 1.36 mg/dL — ABNORMAL HIGH (ref 0.76–1.27)
GFR calc Af Amer: 67 mL/min/{1.73_m2} (ref >59)
GFR calc non Af Amer: 58 mL/min/{1.73_m2} — ABNORMAL LOW (ref >59)
GLUCOSE: 110 mg/dL — AB (ref 65–99)
Globulin, Total: 2.7 g/dL (ref 1.5–4.5)
Potassium: 5.1 mmol/L (ref 3.5–5.2)
Sodium: 142 mmol/L (ref 134–144)
TOTAL PROTEIN: 6.7 g/dL (ref 6.0–8.5)

## 2015-04-12 LAB — LIPID PANEL
CHOLESTEROL TOTAL: 137 mg/dL (ref 100–199)
Chol/HDL Ratio: 5.7 ratio units — ABNORMAL HIGH (ref 0.0–5.0)
HDL: 24 mg/dL — ABNORMAL LOW (ref >39)
LDL Calculated: 67 mg/dL (ref 0–99)
Triglycerides: 229 mg/dL — ABNORMAL HIGH (ref 0–149)
VLDL Cholesterol Cal: 46 mg/dL — ABNORMAL HIGH (ref 5–40)

## 2015-04-13 ENCOUNTER — Ambulatory Visit: Payer: 59 | Admitting: Physical Therapy

## 2015-04-14 ENCOUNTER — Ambulatory Visit: Payer: 59

## 2015-04-14 ENCOUNTER — Encounter: Payer: 59 | Attending: Physical Medicine & Rehabilitation

## 2015-04-14 ENCOUNTER — Ambulatory Visit: Payer: 59 | Admitting: Physical Therapy

## 2015-04-14 ENCOUNTER — Ambulatory Visit: Payer: 59 | Admitting: Physical Medicine & Rehabilitation

## 2015-04-14 ENCOUNTER — Ambulatory Visit (HOSPITAL_BASED_OUTPATIENT_CLINIC_OR_DEPARTMENT_OTHER): Payer: 59 | Admitting: Physical Medicine & Rehabilitation

## 2015-04-14 ENCOUNTER — Encounter: Payer: Self-pay | Admitting: Physical Medicine & Rehabilitation

## 2015-04-14 VITALS — BP 120/59 | HR 49

## 2015-04-14 DIAGNOSIS — G8110 Spastic hemiplegia affecting unspecified side: Secondary | ICD-10-CM | POA: Diagnosis not present

## 2015-04-14 DIAGNOSIS — G8114 Spastic hemiplegia affecting left nondominant side: Secondary | ICD-10-CM

## 2015-04-14 DIAGNOSIS — R2681 Unsteadiness on feet: Secondary | ICD-10-CM

## 2015-04-14 DIAGNOSIS — I63511 Cerebral infarction due to unspecified occlusion or stenosis of right middle cerebral artery: Secondary | ICD-10-CM | POA: Diagnosis not present

## 2015-04-14 DIAGNOSIS — R269 Unspecified abnormalities of gait and mobility: Secondary | ICD-10-CM

## 2015-04-14 NOTE — Patient Instructions (Signed)

## 2015-04-14 NOTE — Therapy (Signed)
[place] [address] [place], [place], [address] Phone: [phone]   Fax:  [phone]  Physical Therapy Treatment  Patient Details  Name: Brian Hall MRN: [id number] Date of Birth: [birth year] Referring Provider:  Claretta Fraise, MD  Encounter Date: 04/14/2015      PT End of Session - 04/14/15 1641    Visit Number 32   Number of Visits 34   Date for PT Re-Evaluation 06/04/15   PT Start Time 1316   PT Stop Time 1403   PT Time Calculation (min) 47 min   Activity Tolerance Patient tolerated treatment well   Behavior During Therapy [facility] for tasks assessed/performed      Past Medical History  Diagnosis Date  . Hypertension   . Diabetes mellitus   . Obesity   . CHF (congestive heart failure)   . SOB (shortness of breath)   . Hyperkalemia   . Renal insufficiency   . Cardiogenic shock   . Cardiac LV ejection fraction 10-20%   . Hyperlipemia   . Hypothyroidism   . Throat cancer 2011    s/p neck dissection, radiation, chemo  . Stroke   . Blurred vision   . Joint pain   . Joint swelling     Past Surgical History  Procedure Laterality Date  . Cardiac catheterization    . Neck dissection Right 2011    s/p resection of throat cancer with multiple nodes removed    There were no vitals filed for this visit.  Visit Diagnosis:  Abnormality of gait  Unsteadiness  Left spastic hemiplegia      Subjective Assessment - 04/14/15 1319    Subjective Pt just saw Dr. Letta Pate this morning; received Botox injections on LUE.   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  He has a motorcycle and would like to ride it again.    Currently in Pain? Yes   Pain Location Ankle   Pain Orientation Left   Pain Descriptors / Indicators Other (Comment)  "Stinging"   Pain Type Chronic  pain   Pain Onset More than a month ago   Pain Frequency Intermittent   Aggravating Factors  "When I put my brace on and sit in my jeep"   Pain Relieving Factors taking brace off   Multiple Pain Sites No                         [facility] Adult PT Treatment/Exercise - 04/14/15 0001    Neuro Re-ed    Neuro Re-ed Details  With NMES on L peroneals for L subtalar eversion, performed L half kneeling (L foot in front) on mat with intermittent UE support to focus on muscular activation at L knee/ankle. Facilitation provided at L knee to decrease L hip adduction. Cueing focused on upright posture, LLE weightbearing. Pt able to tolerate L half kneeling >7 consecutive minutes with RUE support (x45 seconds without UE support with min guard, no LOB). For active rest beak, performed tall kneeling with RUE support at physioball for increased proximal stability, proprioceptive input, and increased selective control of L knee flexion with L hip extension. For spasticity management, performed prolonged passive manual stretch of L gastrocnemius x3 minutes total with pt in semi reclined. Seated EOM with L foot on round side of Bosu, performed  prolonged passive stretch of L soleus with manual assist for stretch of L great toe flexors x4 minutes total.                   PT Short Term Goals - 04/11/15 1658    PT SHORT TERM GOAL #1   Title Pt will perform HEP with supervision of family, for improved strength and balance. (Target 01/10/15)   Time 4   Period Weeks   Status Achieved   PT SHORT TERM GOAL #2   Title Pt will improve Timed UP and Go score to less than or equal to 35 seconds for decreased fall risk.   Baseline Discontinued due to Timed up and Go Test reinforcing increased velocity of movement, which increases spasticity and decreases quality of movement.   Time 4   Period Weeks   Status Achieved   PT SHORT TERM GOAL #3   Title Berg Balance score to be assessed, with pt scoring at  least 5 additional points to decrease fall risk.   Time 4   Period Weeks   Status Achieved  Scored 33 today   PT SHORT TERM GOAL #4   Title Pt will perform at least 6 of 10 reps of sit<>stand transfers no UE support, with equal weightbearing through bilateral lower extremities, independently.   Time 4   Period Weeks   Status Achieved   PT SHORT TERM GOAL #5   Title Pt will improve TUG time from 29.69 sec to 25 seconds for decreased fall risk. Target date: 03/08/15.   Baseline Discontinued due to Timed up and Go Test reinforcing increased velocity of movement, which increases spasticity and decreases quality of movement.   Time 4   Period Weeks   Status Deferred   PT SHORT TERM GOAL #6   Title Pt will ambulate x200' over level surfaces with [facility] and supervision with no overt LOB. Target date: 03/08/15.   Baseline Achieved 03/07/15.   Time 4   Period Weeks   Status Achieved           PT Long Term Goals - 04/11/15 1659    PT LONG TERM GOAL #1   Title Pt will verbalize understanding of fall prevention within home environment. Target 02/10/15   Time 8   Period Weeks   Status Achieved   PT LONG TERM GOAL #2   Title Pt will improve TUG to less than or equal to 25 seconds for decreased fall risk.   Baseline Goal discontinued, as the nature of TUG promotes increased velocity of movement, which increases spasticity and has negative impact on quality of movement.  7/12: 31.87 sec with SBQC   Time 8   Status Deferred  Discontinued; see above.   PT LONG TERM GOAL #3   Title Pt will improve gait velocity to at least 1 ft/sec for improved gait efficiency and safety.   Time 8   Period Weeks   Status Achieved   PT LONG TERM GOAL #4   Title Pt will ambulate at least 50-100 ft using hemiwalker, household distances, with supervision.   Time 8   Period Weeks   Status Achieved   PT LONG TERM GOAL #5   Title Pt will ambulate 150' over outdoor, unlevel surfaces with LRAD and supervision to  indicate increased stability/independence with community mobility. Target date: 04/05/15.   Baseline Met 04/03/15 with pt using SBQC, requiring 25% cueing for safe gait velocity.   Time 8   Period Weeks  Status Achieved   PT LONG TERM GOAL #6   Title Pt will negotiate 4 stairs with single rail and supervision to increase pt independence with community mobility. Target date: 04/05/15.   Baseline Achieved 7/29.   Time 8   Period Weeks   Status Achieved   PT LONG TERM GOAL #7   Title Pt will increase gait speed from 1.02 ft/sec to 1.62 ft/sec to indicate significant improvement in efficiency of ambulation. Modified Target date: 05/03/15   Baseline Continue as STG through renewed POC.   Time 8   Period Weeks   Status On-going  8/3: 1.47 ft/sec with SBQC   PT LONG TERM GOAL #8   Title Pt will improve Berg score from  42 to 48/56 to indicate decreased fall risk and significant improvement in functional standing balance. Target date: 05/31/15   Status On-going   PT LONG TERM GOAL  #9   TITLE Pt will improve score on Postural Assessment Scale for Stroke (PASS) from 30/36 to 33/36 to indicate significantly improved ability to maintain stability during positional change.  Target date: 05/31/15   Status On-going   PT LONG TERM GOAL  #10   TITLE Pt will negotiate standard curb step and standard ramp with mod I using LRAD to indicate increased safety/independence traversing community obstalces.  Target date: 05/31/15   Status On-going   PT LONG TERM GOAL  #11   TITLE Pt will ambulate 300' over unlevel paved surfaces with mod I using LRAD to indicate increased stability with limited community mobility.  Target date: 05/31/15.   Status On-going               Plan - 04/14/15 1642    Clinical Impression Statement Continued to focus on managing spasticity in L ankle plantarflexors/great toe flexors to maximize functional return of L ankle dorsiflexion. Pt able to tolerante L half kneeling (for distal  stability/control, increased proprioceptive input) >7 minutes prior to need for rest break. Continue per POC.   Pt will benefit from skilled therapeutic intervention in order to improve on the following deficits Abnormal gait;Decreased activity tolerance;Decreased balance;Decreased mobility;Decreased range of motion;Decreased safety awareness;Difficulty walking;Decreased strength;Impaired tone;Decreased knowledge of use of DME;Decreased endurance;Decreased coordination;Impaired flexibility;Impaired perceived functional ability;Postural dysfunction;Impaired sensation;Impaired UE functional use;Impaired vision/preception;Decreased cognition;Pain   Rehab Potential Good   PT Frequency 2x / week   PT Duration 8 weeks   PT Treatment/Interventions ADLs/Self Care Home Management;Therapeutic activities;Functional mobility training;Stair training;Gait training;Therapeutic exercise;Balance training;Neuromuscular re-education;Patient/family education;DME Instruction;Cognitive remediation;Orthotic Fit/Training;Manual techniques;Passive range of motion;Visual/perceptual remediation/compensation   PT Next Visit Plan Continue to address functional return of L ankle dorsiflexion/eversion. Prolonged passive stretch of L ankle plantarflexors, great toe flexors. L half kneeling for activation of distal LLE musculature.   Consulted and Agree with Plan of Care Patient;Family member/caregiver   Family Member Consulted wife, [name]        Problem List Patient Active Problem List   Diagnosis Date Noted  . Spastic hemiparesis affecting nondominant side 11/08/2014  . Hypothyroidism 10/27/2014  . Left-sided neglect 09/23/2014  . Acute ischemic right MCA stroke 09/16/2014  . Left hemiparesis   . CVA (cerebral vascular accident)   . Right carotid artery occlusion   . Acute right MCA stroke   . Essential hypertension 09/12/2014  . CVA (cerebral infarction) 09/12/2014  . Type 2 diabetes mellitus with peripheral  neuropathy 09/12/2014    Billie Ruddy, PT, DPT [facility] [address] [place], [place], [address] Phone: [phone]  Fax:  [phone] 04/14/2015, 4:47 PM

## 2015-04-14 NOTE — Progress Notes (Signed)
Botox Injection for spasticity using needle EMG guidance  Dilution: 50 Units/ml Indication: Severe spasticity which interferes with ADL,mobility and/or  hygiene and is unresponsive to medication management and other conservative care Informed consent was obtained after describing risks and benefits of the procedure with the patient. This includes bleeding, bruising, infection, excessive weakness, or medication side effects. A REMS form is on file and signed. Needle: 25g 2" needle electrode Number of units per muscle   FCR25 FCU25 FDS25 FDP25 All injections were done after obtaining appropriate EMG activity and after negative drawback for blood. The patient tolerated the procedure well. Post procedure instructions were given. A followup appointment was made.

## 2015-04-18 ENCOUNTER — Ambulatory Visit: Payer: 59 | Admitting: Physical Therapy

## 2015-04-18 DIAGNOSIS — R2681 Unsteadiness on feet: Secondary | ICD-10-CM

## 2015-04-18 DIAGNOSIS — R269 Unspecified abnormalities of gait and mobility: Secondary | ICD-10-CM

## 2015-04-18 DIAGNOSIS — G8114 Spastic hemiplegia affecting left nondominant side: Secondary | ICD-10-CM

## 2015-04-18 NOTE — Therapy (Signed)
[facility] [address] [place], [place], [address] Phone: [phone]   Fax:  [phone]  Physical Therapy Treatment  Patient Details  Name: Brian Hall MRN: [id number] Date of Birth: [birth year] Referring Provider:  Claretta Fraise, MD  Encounter Date: 04/18/2015      PT End of Session - 04/18/15 1742    Visit Number 33   Number of Visits 58   Date for PT Re-Evaluation 06/04/15   PT Start Time 7209   PT Stop Time 1530   PT Time Calculation (min) 45 min   Equipment Utilized During Treatment Gait belt   Activity Tolerance Patient tolerated treatment well   Behavior During Therapy [facility] for tasks assessed/performed      Past Medical History  Diagnosis Date  . Hypertension   . Diabetes mellitus   . Obesity   . CHF (congestive heart failure)   . SOB (shortness of breath)   . Hyperkalemia   . Renal insufficiency   . Cardiogenic shock   . Cardiac LV ejection fraction 10-20%   . Hyperlipemia   . Hypothyroidism   . Throat cancer 2011    s/p neck dissection, radiation, chemo  . Stroke   . Blurred vision   . Joint pain   . Joint swelling     Past Surgical History  Procedure Laterality Date  . Cardiac catheterization    . Neck dissection Right 2011    s/p resection of throat cancer with multiple nodes removed    There were no vitals filed for this visit.  Visit Diagnosis:  Abnormality of gait  Unsteadiness  Left spastic hemiplegia      Subjective Assessment - 04/18/15 1728    Subjective Pt reports no falls, no changes.   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  He has a motorcycle and would like to ride it again.    Currently in Pain? Yes   Pain Score 2    Pain Location Ankle   Pain Orientation Left   Pain Descriptors / Indicators Other (Comment)   "Stinging"   Pain Type Chronic pain   Pain Onset More than a month ago   Pain Frequency Intermittent   Aggravating Factors  wearing brace too long, walking   Pain Relieving Factors taking brace off, resting   Multiple Pain Sites No                         [facility] Adult PT Treatment/Exercise - 04/18/15 0001    Ambulation/Gait   Ambulation/Gait Yes   Ambulation/Gait Assistance 4: Min guard;4: Min assist;5: Supervision   Ambulation/Gait Assistance Details Supervision with SBQC, Min guard-min A without AD   Ambulation Distance (Feet) 310 Feet  2 x115' without AD; x80 with SBQC   Assistive device None;Small based quad cane   Gait Pattern Step-through pattern;Decreased arm swing - left;Decreased stance time - left;Decreased hip/knee flexion - left;Decreased weight shift to left;Left foot flat;Lateral trunk lean to right;Trunk rotated posteriorly on left;Poor foot clearance - left  intermittent L toe drag with effective self-recovery   Ambulation Surface Level;Indoor   Gait Comments Cueing focused on LLE stance stability, anterior/lateral weight shifting during RLE advancement   Neuro Re-ed    Neuro Re-ed Details  Performed floor transfer x3 trials to utilize transitional movements for  sustained L-sided muscle activation; L half kneeling for increased distal stability; R half kneeling to increase selective control/decreased extensor synergy; and tall kneeling for proprioceptive input, to address L pelvic retraction. In tall kneeling on floor with BUE support on mat table, pt performed the following: sidestepping 2 x3 reps per side for functional weight shifting; forward/retro gait x4 reps for L stance stability, LLE advancement originating at hip flexors; squats x10 with 2" foam under RLE for increased LLE weightbearing.                   PT Short Term Goals - 04/11/15 1658    PT SHORT TERM GOAL #1   Title Pt will perform HEP with supervision of family, for improved  strength and balance. (Target 01/10/15)   Time 4   Period Weeks   Status Achieved   PT SHORT TERM GOAL #2   Title Pt will improve Timed UP and Go score to less than or equal to 35 seconds for decreased fall risk.   Baseline Discontinued due to Timed up and Go Test reinforcing increased velocity of movement, which increases spasticity and decreases quality of movement.   Time 4   Period Weeks   Status Achieved   PT SHORT TERM GOAL #3   Title Berg Balance score to be assessed, with pt scoring at least 5 additional points to decrease fall risk.   Time 4   Period Weeks   Status Achieved  Scored 33 today   PT SHORT TERM GOAL #4   Title Pt will perform at least 6 of 10 reps of sit<>stand transfers no UE support, with equal weightbearing through bilateral lower extremities, independently.   Time 4   Period Weeks   Status Achieved   PT SHORT TERM GOAL #5   Title Pt will improve TUG time from 29.69 sec to 25 seconds for decreased fall risk. Target date: 03/08/15.   Baseline Discontinued due to Timed up and Go Test reinforcing increased velocity of movement, which increases spasticity and decreases quality of movement.   Time 4   Period Weeks   Status Deferred   PT SHORT TERM GOAL #6   Title Pt will ambulate x200' over level surfaces with [facility] and supervision with no overt LOB. Target date: 03/08/15.   Baseline Achieved 03/07/15.   Time 4   Period Weeks   Status Achieved           PT Long Term Goals - 04/11/15 1659    PT LONG TERM GOAL #1   Title Pt will verbalize understanding of fall prevention within home environment. Target 02/10/15   Time 8   Period Weeks   Status Achieved   PT LONG TERM GOAL #2   Title Pt will improve TUG to less than or equal to 25 seconds for decreased fall risk.   Baseline Goal discontinued, as the nature of TUG promotes increased velocity of movement, which increases spasticity and has negative impact on quality of movement.  7/12: 31.87 sec with SBQC   Time  8   Status Deferred  Discontinued; see above.   PT LONG TERM GOAL #3   Title Pt will improve gait velocity to at least 1 ft/sec for improved gait efficiency and safety.   Time 8   Period Weeks   Status Achieved   PT LONG TERM GOAL #4   Title Pt will ambulate at least 50-100 ft using hemiwalker, household distances, with supervision.   Time 8   Period  Weeks   Status Achieved   PT LONG TERM GOAL #5   Title Pt will ambulate 150' over outdoor, unlevel surfaces with LRAD and supervision to indicate increased stability/independence with community mobility. Target date: 04/05/15.   Baseline Met 04/03/15 with pt using SBQC, requiring 25% cueing for safe gait velocity.   Time 8   Period Weeks   Status Achieved   PT LONG TERM GOAL #6   Title Pt will negotiate 4 stairs with single rail and supervision to increase pt independence with community mobility. Target date: 04/05/15.   Baseline Achieved 7/29.   Time 8   Period Weeks   Status Achieved   PT LONG TERM GOAL #7   Title Pt will increase gait speed from 1.02 ft/sec to 1.62 ft/sec to indicate significant improvement in efficiency of ambulation. Modified Target date: 05/03/15   Baseline Continue as STG through renewed POC.   Time 8   Period Weeks   Status On-going  8/3: 1.47 ft/sec with SBQC   PT LONG TERM GOAL #8   Title Pt will improve Berg score from  42 to 48/56 to indicate decreased fall risk and significant improvement in functional standing balance. Target date: 05/31/15   Status On-going   PT LONG TERM GOAL  #9   TITLE Pt will improve score on Postural Assessment Scale for Stroke (PASS) from 30/36 to 33/36 to indicate significantly improved ability to maintain stability during positional change.  Target date: 05/31/15   Status On-going   PT LONG TERM GOAL  #10   TITLE Pt will negotiate standard curb step and standard ramp with mod I using LRAD to indicate increased safety/independence traversing community obstalces.  Target date: 05/31/15    Status On-going   PT LONG TERM GOAL  #11   TITLE Pt will ambulate 300' over unlevel paved surfaces with mod I using LRAD to indicate increased stability with limited community mobility.  Target date: 05/31/15.   Status On-going               Plan - 04/18/15 1743    Clinical Impression Statement Pt performed final of 3 floor transfers with min guard during this session, suggesting improved stability with transitional movements, increased proximal stability, and decreased LLE extensor tone. During gait, pt continues to exhibit L pelvic retraction during RLE advancement, decreased anterior/lateral weight shift during RLE advancement. Continue per POC.   Pt will benefit from skilled therapeutic intervention in order to improve on the following deficits Abnormal gait;Decreased activity tolerance;Decreased balance;Decreased mobility;Decreased range of motion;Decreased safety awareness;Difficulty walking;Decreased strength;Impaired tone;Decreased knowledge of use of DME;Decreased endurance;Decreased coordination;Impaired flexibility;Impaired perceived functional ability;Postural dysfunction;Impaired sensation;Impaired UE functional use;Impaired vision/preception;Decreased cognition;Pain   Rehab Potential Good   PT Frequency 2x / week   PT Duration 8 weeks   PT Treatment/Interventions ADLs/Self Care Home Management;Therapeutic activities;Functional mobility training;Stair training;Gait training;Therapeutic exercise;Balance training;Neuromuscular re-education;Patient/family education;DME Instruction;Cognitive remediation;Orthotic Fit/Training;Manual techniques;Passive range of motion;Visual/perceptual remediation/compensation   PT Next Visit Plan Continue to address functional return of L ankle dorsiflexion/eversion. Prolonged passive stretch of L ankle plantarflexors, great toe flexors. L half kneeling for activation of distal LLE musculature.   Consulted and Agree with Plan of Care Patient;Family  member/caregiver   Family Member Consulted wife, [name]        Problem List Patient Active Problem List   Diagnosis Date Noted  . Spastic hemiparesis affecting nondominant side 11/08/2014  . Hypothyroidism 10/27/2014  . Left-sided neglect 09/23/2014  . Acute ischemic right MCA stroke 09/16/2014  .  Left hemiparesis   . CVA (cerebral vascular accident)   . Right carotid artery occlusion   . Acute right MCA stroke   . Essential hypertension 09/12/2014  . CVA (cerebral infarction) 09/12/2014  . Type 2 diabetes mellitus with peripheral neuropathy 09/12/2014    Billie Ruddy, PT, DPT [facility] [address] [place], [place], [address] Phone: [phone]   Fax:  [phone] 04/18/2015, 5:47 PM

## 2015-04-20 ENCOUNTER — Other Ambulatory Visit: Payer: Self-pay | Admitting: Family Medicine

## 2015-04-20 ENCOUNTER — Ambulatory Visit: Payer: 59 | Admitting: Physical Therapy

## 2015-04-20 DIAGNOSIS — R269 Unspecified abnormalities of gait and mobility: Secondary | ICD-10-CM

## 2015-04-20 DIAGNOSIS — R2681 Unsteadiness on feet: Secondary | ICD-10-CM

## 2015-04-20 DIAGNOSIS — G8114 Spastic hemiplegia affecting left nondominant side: Secondary | ICD-10-CM

## 2015-04-20 NOTE — Therapy (Signed)
[facility] [address] [place], [place], [address] Phone: [phone]   Fax:  [phone]  Physical Therapy Treatment  Patient Details  Name: Brian Hall MRN: [id number] Date of Birth: 1956-07-13 Referring Provider:  Claretta Fraise, MD  Encounter Date: 04/20/2015      PT End of Session - 04/20/15 1604    Visit Number 34   Number of Visits 49   Date for PT Re-Evaluation 06/04/15   PT Start Time 1016   PT Stop Time 1102   PT Time Calculation (min) 46 min   Activity Tolerance Patient tolerated treatment well   Behavior During Therapy [facility] for tasks assessed/performed      Past Medical History  Diagnosis Date  . Hypertension   . Diabetes mellitus   . Obesity   . CHF (congestive heart failure)   . SOB (shortness of breath)   . Hyperkalemia   . Renal insufficiency   . Cardiogenic shock   . Cardiac LV ejection fraction 10-20%   . Hyperlipemia   . Hypothyroidism   . Throat cancer 2011    s/p neck dissection, radiation, chemo  . Stroke   . Blurred vision   . Joint pain   . Joint swelling     Past Surgical History  Procedure Laterality Date  . Cardiac catheterization    . Neck dissection Right 2011    s/p resection of throat cancer with multiple nodes removed    There were no vitals filed for this visit.  Visit Diagnosis:  Abnormality of gait  Unsteadiness  Left spastic hemiplegia      Subjective Assessment - 04/20/15 1024    Subjective Pt reports no falls. Reports that milld L ankle pain is grossly unchanged.   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  He has a motorcycle and would like to ride it again.    Currently in Pain? Yes   Pain Score 1    Pain Location Ankle   Pain Orientation Left   Pain Descriptors / Indicators --  stinging   Pain Type Chronic pain   Pain Onset More than a month ago    Pain Frequency Intermittent   Aggravating Factors  wearing brace, walking   Pain Relieving Factors taking brace off, resting   Multiple Pain Sites No                         [facility] Adult PT Treatment/Exercise - 04/20/15 0001    Ambulation/Gait   Ambulation/Gait Yes   Ambulation/Gait Assistance 5: Supervision   Ambulation Distance (Feet) 225 Feet  2 x115' without AD; x80 with [facility]   Assistive device Small based quad cane   Gait Pattern Step-through pattern;Decreased arm swing - left;Decreased stance time - left;Decreased hip/knee flexion - left;Decreased weight shift to left;Left foot flat;Lateral trunk lean to right;Trunk rotated posteriorly on left;Poor foot clearance - left   Ambulation Surface Level;Indoor   Neuro Re-ed    Neuro Re-ed Details  With pt in semi reclined, performed prolonged passive stretch of L dorsiflexion, eversion, and great toe extension x5 consecutive minutes.Trialed NMES on L extensor hallucis longus with minimal activation noted. The following was performed with NMES on L peroneals to decrease supination during L ankle dorsiflexion: semi reclined L ankle dorsiflexion AAROM with manual assist with focus on L great toe extension, L  subtalar eversion; L half kneeling x12 minutes consecutively for increased distal stability/activation. During half kneeling, pt able to progress from RUE support on physioball to static L tall kneeling wiithout UE support for >3 conseucitve minutes with SBA, to dynamic task (RUE reaching across midline for increased LLE weightbearing) without UE support with min guard - min A.  Half kneeling: cueing for upright posture, LLE WB                  PT Short Term Goals - 04/11/15 1658    PT SHORT TERM GOAL #1   Title Pt will perform HEP with supervision of family, for improved strength and balance. (Target 01/10/15)   Time 4   Period Weeks   Status Achieved   PT SHORT TERM GOAL #2   Title Pt will improve Timed UP  and Go score to less than or equal to 35 seconds for decreased fall risk.   Baseline Discontinued due to Timed up and Go Test reinforcing increased velocity of movement, which increases spasticity and decreases quality of movement.   Time 4   Period Weeks   Status Achieved   PT SHORT TERM GOAL #3   Title Berg Balance score to be assessed, with pt scoring at least 5 additional points to decrease fall risk.   Time 4   Period Weeks   Status Achieved  Scored 33 today   PT SHORT TERM GOAL #4   Title Pt will perform at least 6 of 10 reps of sit<>stand transfers no UE support, with equal weightbearing through bilateral lower extremities, independently.   Time 4   Period Weeks   Status Achieved   PT SHORT TERM GOAL #5   Title Pt will improve TUG time from 29.69 sec to 25 seconds for decreased fall risk. Target date: 03/08/15.   Baseline Discontinued due to Timed up and Go Test reinforcing increased velocity of movement, which increases spasticity and decreases quality of movement.   Time 4   Period Weeks   Status Deferred   PT SHORT TERM GOAL #6   Title Pt will ambulate x200' over level surfaces with [facility] and supervision with no overt LOB. Target date: 03/08/15.   Baseline Achieved 03/07/15.   Time 4   Period Weeks   Status Achieved           PT Long Term Goals - 04/11/15 1659    PT LONG TERM GOAL #1   Title Pt will verbalize understanding of fall prevention within home environment. Target 02/10/15   Time 8   Period Weeks   Status Achieved   PT LONG TERM GOAL #2   Title Pt will improve TUG to less than or equal to 25 seconds for decreased fall risk.   Baseline Goal discontinued, as the nature of TUG promotes increased velocity of movement, which increases spasticity and has negative impact on quality of movement.  7/12: 31.87 sec with SBQC   Time 8   Status Deferred  Discontinued; see above.   PT LONG TERM GOAL #3   Title Pt will improve gait velocity to at least 1 ft/sec for  improved gait efficiency and safety.   Time 8   Period Weeks   Status Achieved   PT LONG TERM GOAL #4   Title Pt will ambulate at least 50-100 ft using hemiwalker, household distances, with supervision.   Time 8   Period Weeks   Status Achieved   PT LONG TERM GOAL #5   Title  Pt will ambulate 150' over outdoor, unlevel surfaces with LRAD and supervision to indicate increased stability/independence with community mobility. Target date: 04/05/15.   Baseline Met 04/03/15 with pt using SBQC, requiring 25% cueing for safe gait velocity.   Time 8   Period Weeks   Status Achieved   PT LONG TERM GOAL #6   Title Pt will negotiate 4 stairs with single rail and supervision to increase pt independence with community mobility. Target date: 04/05/15.   Baseline Achieved 7/29.   Time 8   Period Weeks   Status Achieved   PT LONG TERM GOAL #7   Title Pt will increase gait speed from 1.02 ft/sec to 1.62 ft/sec to indicate significant improvement in efficiency of ambulation. Modified Target date: 05/03/15   Baseline Continue as STG through renewed POC.   Time 8   Period Weeks   Status On-going  8/3: 1.47 ft/sec with SBQC   PT LONG TERM GOAL #8   Title Pt will improve Berg score from  42 to 48/56 to indicate decreased fall risk and significant improvement in functional standing balance. Target date: 05/31/15   Status On-going   PT LONG TERM GOAL  #9   TITLE Pt will improve score on Postural Assessment Scale for Stroke (PASS) from 30/36 to 33/36 to indicate significantly improved ability to maintain stability during positional change.  Target date: 05/31/15   Status On-going   PT LONG TERM GOAL  #10   TITLE Pt will negotiate standard curb step and standard ramp with mod I using LRAD to indicate increased safety/independence traversing community obstalces.  Target date: 05/31/15   Status On-going   PT LONG TERM GOAL  #11   TITLE Pt will ambulate 300' over unlevel paved surfaces with mod I using LRAD to indicate  increased stability with limited community mobility.  Target date: 05/31/15.   Status On-going               Plan - 04/20/15 1604    Clinical Impression Statement Session focused on increasing activation/functional return in LLE via L half kneeling and use of L NMES. Pt able to maintain L half kneeling >3 consecutive minutes without UE support with SBA and no LOB. Pt will continue to benefit from skilled outpatient PT to increase stability/independence with functional mobility and decrease fall risk.    Pt will benefit from skilled therapeutic intervention in order to improve on the following deficits Abnormal gait;Decreased activity tolerance;Decreased balance;Decreased mobility;Decreased range of motion;Decreased safety awareness;Difficulty walking;Decreased strength;Impaired tone;Decreased knowledge of use of DME;Decreased endurance;Decreased coordination;Impaired flexibility;Impaired perceived functional ability;Postural dysfunction;Impaired sensation;Impaired UE functional use;Impaired vision/preception;Decreased cognition;Pain   Rehab Potential Good   PT Frequency 2x / week   PT Duration 8 weeks   PT Treatment/Interventions ADLs/Self Care Home Management;Therapeutic activities;Functional mobility training;Stair training;Gait training;Therapeutic exercise;Balance training;Neuromuscular re-education;Patient/family education;DME Instruction;Cognitive remediation;Orthotic Fit/Training;Manual techniques;Passive range of motion;Visual/perceptual remediation/compensation   PT Next Visit Plan Work on increasing L hip/knee flexion during LLE advancement with gait. Continue to address functional return of L ankle dorsiflexion/eversion. Prolonged passive stretch of L ankle plantarflexors, great toe flexors. L half kneeling for activation of distal LLE musculature.   Consulted and Agree with Plan of Care Patient;Family member/caregiver   Family Member Consulted wife, [name]        Problem  List Patient Active Problem List   Diagnosis Date Noted  . Spastic hemiparesis affecting nondominant side 11/08/2014  . Hypothyroidism 10/27/2014  . Left-sided neglect 09/23/2014  . Acute ischemic right MCA stroke 09/16/2014  .  Left hemiparesis   . CVA (cerebral vascular accident)   . Right carotid artery occlusion   . Acute right MCA stroke   . Essential hypertension 09/12/2014  . CVA (cerebral infarction) 09/12/2014  . Type 2 diabetes mellitus with peripheral neuropathy 09/12/2014    Billie Ruddy, PT, DPT [facility] [address] [place], [place], [address] Phone: [phone]   Fax:  [phone] 04/20/2015, 4:09 PM

## 2015-04-25 ENCOUNTER — Ambulatory Visit: Payer: 59 | Admitting: Occupational Therapy

## 2015-04-25 ENCOUNTER — Ambulatory Visit: Payer: 59 | Admitting: Physical Therapy

## 2015-04-25 ENCOUNTER — Encounter: Payer: Self-pay | Admitting: Occupational Therapy

## 2015-04-25 DIAGNOSIS — R4189 Other symptoms and signs involving cognitive functions and awareness: Secondary | ICD-10-CM

## 2015-04-25 DIAGNOSIS — R269 Unspecified abnormalities of gait and mobility: Secondary | ICD-10-CM | POA: Diagnosis not present

## 2015-04-25 DIAGNOSIS — G8114 Spastic hemiplegia affecting left nondominant side: Secondary | ICD-10-CM

## 2015-04-25 DIAGNOSIS — G8194 Hemiplegia, unspecified affecting left nondominant side: Secondary | ICD-10-CM

## 2015-04-25 DIAGNOSIS — R201 Hypoesthesia of skin: Secondary | ICD-10-CM

## 2015-04-25 NOTE — Therapy (Signed)
[facility] [address] [place], [place], [address] Phone: [phone]   Fax:  [phone]  Occupational Therapy Treatment  Patient Details  Name: Brian Hall MRN: [id number] Date of Birth: 02-May-1956 Referring Provider:  Claretta Fraise, MD  Encounter Date: 04/25/2015      OT End of Session - 04/25/15 1654    Visit Number 33   Number of Visits 18   Date for OT Re-Evaluation 06/06/15   Authorization Type [employer]   OT Start Time 7741   OT Stop Time 1615   OT Time Calculation (min) 43 min   Activity Tolerance Patient tolerated treatment well      Past Medical History  Diagnosis Date  . Hypertension   . Diabetes mellitus   . Obesity   . CHF (congestive heart failure)   . SOB (shortness of breath)   . Hyperkalemia   . Renal insufficiency   . Cardiogenic shock   . Cardiac LV ejection fraction 10-20%   . Hyperlipemia   . Hypothyroidism   . Throat cancer 2011    s/p neck dissection, radiation, chemo  . Stroke   . Blurred vision   . Joint pain   . Joint swelling     Past Surgical History  Procedure Laterality Date  . Cardiac catheterization    . Neck dissection Right 2011    s/p resection of throat cancer with multiple nodes removed    There were no vitals filed for this visit.  Visit Diagnosis:  Left spastic hemiplegia  Hemiplegia affecting left nondominant side  Impaired sensation  Impaired cognition      Subjective Assessment - 04/25/15 1532    Subjective  My wrist is bothering me too   Patient is accompained by: Family member  wife   Pertinent History see epic snapshot   Patient Stated Goals Get back to doing some of the things I used to do.   Currently in Pain? Yes   Pain Score 7    Pain Location Calf   Pain Orientation Left   Pain Descriptors / Indicators Throbbing   Pain Type Acute pain   Pain Onset In the past 7 days   Pain Frequency Constant   Aggravating Factors   standing, walking, stretching exercises   Pain Relieving Factors rest. not moving it   Multiple Pain Sites Yes   Pain Score 3   Pain Location Wrist   Pain Orientation Left   Pain Descriptors / Indicators --  stinging   Pain Onset In the past 7 days   Pain Frequency Intermittent   Aggravating Factors  I did it in PT session with the "peanut"   Pain Relieving Factors brace, rest,                       OT Treatments/Exercises (OP) - 04/25/15 0001    Neurological Re-education Exercises   Other Exercises 1 Neuro re ed to address functional reach and attempted release of object  as well as reaching with object in hand by sliding with emphasis on grading grasp. Pt had botox injection on 04/14/2015 therefore will place pt on hold for two more weeks to allow optimal benefit of botox injection. Pt and wife in agreement.   Chief of Staff Action wrist and finger extension   Electrical Stimulation Parameters 250 ps, 50pps, 10 sec cycle, 2 sec ramp, intensity 35-42   Electrical Stimulation Goals  Tone;Neuromuscular facilitation   Manual Therapy   Manual therapy comments Pt with increased wrist pain today due to malalignment and stiffnes. Pt tolerated joint and soft tissue mob to achieve better alignment prior to e stim and neuro re ed.                  OT Short Term Goals - 04/25/15 1648    OT SHORT TERM GOAL  #9   Status --  pt is now stepping over ledge and into shower wtih min with wife to sit on seat   OT SHORT TERM GOAL  #11   TITLE Pt and wife will be mod I with upgraded HEP - 05/09/2015   Status On-going   OT SHORT TERM GOAL  #12   TITLE Pt will be supevision for simple hot meal prep   Status On-going   OT SHORT TERM GOAL  #13   TITLE Pt will demonstrate ability for bilateral mid reach with light object without facilitation.   Status On-going           OT Long Term Goals -  04/25/15 1650    OT LONG TERM GOAL #8   Title Pt will demonstrate the ability to use LUE as gross a for basic ADL's 100% of the time - 06/06/2015   Status On-going  able to use for bathing   OT LONG TERM GOAL  #10   TITLE Pt will be supervision  for simple hot meal prep   Status On-going   OT LONG TERM GOAL  #11   Status --  with min facilitation and min compensations   OT LONG TERM GOAL  #12   TITLE Pt will demonstrate ability to release objects with mod facilitation.    Status On-going   OT LONG TERM GOAL  #13   TITLE Pt will demonstrate ability for unilateral low reach with assist for gross grasp with mn compensations.   Status On-going   OT LONG TERM GOAL  #14   TITLE Pt will be mod I with upgraded HEP prn   Status On-going               Plan - 04/25/15 1652    Clinical Impression Statement Pt continues to make slow progress toward goals. Pt had botox injection on 04/14/2015 therefore will place pt on hold for two more weeks to allow for optimal benefit from injection for remainder of OT visits.   Pt will benefit from skilled therapeutic intervention in order to improve on the following deficits (Retired) Decreased balance;Decreased coordination;Decreased cognition;Decreased knowledge of use of DME;Decreased mobility;Decreased range of motion;Decreased safety awareness;Difficulty walking;Decreased strength;Increased edema;Increased muscle spasms;Impaired UE functional use;Impaired tone;Impaired sensation;Impaired vision/preception;Pain   Rehab Potential Good   Clinical Impairments Affecting Rehab Potential Pt will benefit from skilled OT to address deficits outlined in eval to maximize independence.   OT Frequency 2x / week   OT Duration 8 weeks   OT Treatment/Interventions Self-care/ADL training;Cryotherapy;Moist Heat;Electrical Stimulation;Fluidtherapy;DME and/or AE instruction;Energy conservation;Neuromuscular education;Therapeutic exercise;Functional Mobility  Training;Manual Therapy;Passive range of motion;Splinting;Therapeutic activities;Balance training;Patient/family education;Cognitive remediation/compensation   Plan hold hold for two weeks, then resume therapy, NMR to LUE with emphasis on reach and grasp and release   Consulted and Agree with Plan of Care Patient;Family member/caregiver   Family Member Consulted wife        Problem List Patient Active Problem List   Diagnosis Date Noted  . Spastic hemiparesis affecting nondominant side 11/08/2014  . Hypothyroidism 10/27/2014  . Left-sided  neglect 09/23/2014  . Acute ischemic right MCA stroke 09/16/2014  . Left hemiparesis   . CVA (cerebral vascular accident)   . Right carotid artery occlusion   . Acute right MCA stroke   . Essential hypertension 09/12/2014  . CVA (cerebral infarction) 09/12/2014  . Type 2 diabetes mellitus with peripheral neuropathy 09/12/2014    Quay Burow, OTR/L 04/25/2015, 4:57 PM  [facility] [address] [place], [place], [address] Phone: [phone]   Fax:  [phone]

## 2015-04-25 NOTE — Therapy (Signed)
[facility] [address] [place], [place], [address] Phone: [phone]   Fax:  [phone]  Physical Therapy Treatment  Patient Details  Name: Brian Hall MRN: [id number] Date of Birth: [birth year] Referring Provider:  Claretta Fraise, MD  Encounter Date: 04/25/2015      PT End of Session - 04/25/15 1547    Visit Number 35   Number of Visits 72   Date for PT Re-Evaluation 06/04/15   PT Start Time 1446   PT Stop Time 1500   PT Time Calculation (min) 14 min   Equipment Utilized During Treatment --  Session not initiated due to inability to R/O LLE injury      Past Medical History  Diagnosis Date  . Hypertension   . Diabetes mellitus   . Obesity   . CHF (congestive heart failure)   . SOB (shortness of breath)   . Hyperkalemia   . Renal insufficiency   . Cardiogenic shock   . Cardiac LV ejection fraction 10-20%   . Hyperlipemia   . Hypothyroidism   . Throat cancer 2011    s/p neck dissection, radiation, chemo  . Stroke   . Blurred vision   . Joint pain   . Joint swelling     Past Surgical History  Procedure Laterality Date  . Cardiac catheterization    . Neck dissection Right 2011    s/p resection of throat cancer with multiple nodes removed    There were no vitals filed for this visit.  Visit Diagnosis:  Abnormality of gait  Left spastic hemiplegia      Subjective Assessment - 04/25/15 1534    Subjective Pt reporting significant pain in L calf since last week. Pt did not notify this PT during the session, but pt felt pain in L calf during heel cord stretching. There was an audible "pop". Pt's spouse attempted to stretch L calf on Friiday, which caused significant searing pain in L calf. Pt has been unable to bear weight on LLE since last Friday.   Patient is accompained by: Family member   Pertinent History CVA with L hemiparesis 09/2014   Patient Stated Goals Pt's goal is to get back to  walking and using L hand again.  He has a motorcycle and would like to ride it again.    Currently in Pain? Yes   Pain Score 1   1/10 at rest; 8/10 with weightbearing   Pain Location Calf   Pain Orientation Left   Pain Descriptors / Indicators Stabbing   Pain Type Acute pain   Pain Onset In the past 7 days   Pain Frequency Intermittent   Aggravating Factors  weightbearing, stretching L calf   Pain Relieving Factors nonweightbearing   Effect of Pain on Daily Activities Unable to bear weight on LLE   Multiple Pain Sites No            [facility] PT Assessment - 04/25/15 0001    Observation/Other Assessments   Observations Palpation reveals irregularity, point tenderness in medial belly of L gastrocnemius muscle.Visible soft tissue swelling in L calf as compared with R.   AROM   Overall AROM Comments Pt unable to actively plantarflex L ankle secondary to pain.L ankle dorsilfexion intact (< 50% ROM) but painful.   PROM and strength NT due to pain with AROM  PT Education - 04/25/15 1600    Education provided Yes   Education Details Holding PT and recommending that pt/spouse hold all home exercises, modalities, and ambulation until pt has been seen/cleared by orthopedist tomorrow at 9:30 am.   Person(s) Educated Patient;Spouse   Methods Explanation   Comprehension Verbalized understanding          PT Short Term Goals - 04/11/15 1658    PT SHORT TERM GOAL #1   Title Pt will perform HEP with supervision of family, for improved strength and balance. (Target 01/10/15)   Time 4   Period Weeks   Status Achieved   PT SHORT TERM GOAL #2   Title Pt will improve Timed UP and Go score to less than or equal to 35 seconds for decreased fall risk.   Baseline Discontinued due to Timed up and Go Test reinforcing increased velocity of movement, which increases spasticity and decreases quality of movement.   Time 4   Period Weeks   Status Achieved    PT SHORT TERM GOAL #3   Title Berg Balance score to be assessed, with pt scoring at least 5 additional points to decrease fall risk.   Time 4   Period Weeks   Status Achieved  Scored 33 today   PT SHORT TERM GOAL #4   Title Pt will perform at least 6 of 10 reps of sit<>stand transfers no UE support, with equal weightbearing through bilateral lower extremities, independently.   Time 4   Period Weeks   Status Achieved   PT SHORT TERM GOAL #5   Title Pt will improve TUG time from 29.69 sec to 25 seconds for decreased fall risk. Target date: 03/08/15.   Baseline Discontinued due to Timed up and Go Test reinforcing increased velocity of movement, which increases spasticity and decreases quality of movement.   Time 4   Period Weeks   Status Deferred   PT SHORT TERM GOAL #6   Title Pt will ambulate x200' over level surfaces with [facility] and supervision with no overt LOB. Target date: 03/08/15.   Baseline Achieved 03/07/15.   Time 4   Period Weeks   Status Achieved           PT Long Term Goals - 04/11/15 1659    PT LONG TERM GOAL #1   Title Pt will verbalize understanding of fall prevention within home environment. Target 02/10/15   Time 8   Period Weeks   Status Achieved   PT LONG TERM GOAL #2   Title Pt will improve TUG to less than or equal to 25 seconds for decreased fall risk.   Baseline Goal discontinued, as the nature of TUG promotes increased velocity of movement, which increases spasticity and has negative impact on quality of movement.  7/12: 31.87 sec with SBQC   Time 8   Status Deferred  Discontinued; see above.   PT LONG TERM GOAL #3   Title Pt will improve gait velocity to at least 1 ft/sec for improved gait efficiency and safety.   Time 8   Period Weeks   Status Achieved   PT LONG TERM GOAL #4   Title Pt will ambulate at least 50-100 ft using hemiwalker, household distances, with supervision.   Time 8   Period Weeks   Status Achieved   PT LONG TERM GOAL #5   Title  Pt will ambulate 150' over outdoor, unlevel surfaces with LRAD and supervision to indicate increased stability/independence with community mobility. Target date: 04/05/15.  Baseline Met 04/03/15 with pt using SBQC, requiring 25% cueing for safe gait velocity.   Time 8   Period Weeks   Status Achieved   PT LONG TERM GOAL #6   Title Pt will negotiate 4 stairs with single rail and supervision to increase pt independence with community mobility. Target date: 04/05/15.   Baseline Achieved 7/29.   Time 8   Period Weeks   Status Achieved   PT LONG TERM GOAL #7   Title Pt will increase gait speed from 1.02 ft/sec to 1.62 ft/sec to indicate significant improvement in efficiency of ambulation. Modified Target date: 05/03/15   Baseline Continue as STG through renewed POC.   Time 8   Period Weeks   Status On-going  8/3: 1.47 ft/sec with SBQC   PT LONG TERM GOAL #8   Title Pt will improve Berg score from  42 to 48/56 to indicate decreased fall risk and significant improvement in functional standing balance. Target date: 05/31/15   Status On-going   PT LONG TERM GOAL  #9   TITLE Pt will improve score on Postural Assessment Scale for Stroke (PASS) from 30/36 to 33/36 to indicate significantly improved ability to maintain stability during positional change.  Target date: 05/31/15   Status On-going   PT LONG TERM GOAL  #10   TITLE Pt will negotiate standard curb step and standard ramp with mod I using LRAD to indicate increased safety/independence traversing community obstalces.  Target date: 05/31/15   Status On-going   PT LONG TERM GOAL  #11   TITLE Pt will ambulate 300' over unlevel paved surfaces with mod I using LRAD to indicate increased stability with limited community mobility.  Target date: 05/31/15.   Status On-going               Plan - 04/25/15 1552    Clinical Impression Statement Pt arrived to this session with report of pain in L calf which began during L heel cord stretching last PT  session (Thursday 8/18) and became increasingly worse the next day when wife was performing manual stretch of L calf. Pt reports he has been unable to bear weight on LLE since last week (unable to discern in Thursday or Friday when discussing with pt). An audible "pop" was heard during stretching on Thursday during PT, but pt reported this originated from L hip and did not c/o calf pain at this time. Due to impaired LLE sensation and pt difficulty expressing nature/severity of pain, referred pt to orthopedist to rule out any acute injury. Holding PT and recommending that pt/spouse hold all home exercises, modalities, and ambulation until pt has been seen/cleared by orthopedist tomorrow at 9:30 am.   Pt will benefit from skilled therapeutic intervention in order to improve on the following deficits Abnormal gait;Decreased activity tolerance;Decreased balance;Decreased mobility;Decreased range of motion;Decreased safety awareness;Difficulty walking;Decreased strength;Impaired tone;Decreased knowledge of use of DME;Decreased endurance;Decreased coordination;Impaired flexibility;Impaired perceived functional ability;Postural dysfunction;Impaired sensation;Impaired UE functional use;Impaired vision/preception;Decreased cognition;Pain   Rehab Potential Good   PT Frequency 2x / week   PT Duration 8 weeks   PT Treatment/Interventions ADLs/Self Care Home Management;Therapeutic activities;Functional mobility training;Stair training;Gait training;Therapeutic exercise;Balance training;Neuromuscular re-education;Patient/family education;DME Instruction;Cognitive remediation;Orthotic Fit/Training;Manual techniques;Passive range of motion;Visual/perceptual remediation/compensation   PT Next Visit Plan Work on increasing L hip/knee flexion during LLE advancement with gait. Continue to address functional return of L ankle dorsiflexion/eversion. Prolonged passive stretch of L ankle plantarflexors, great toe flexors. L half  kneeling for activation of distal LLE musculature.  Recommended Other Services Made appt with orthopedist, Dr. Paulla Fore, at 9:30 am on 8/24. Follow up on findings and recommendations.   Consulted and Agree with Plan of Care Patient;Family member/caregiver   Family Member Consulted wife, [name]        Problem List Patient Active Problem List   Diagnosis Date Noted  . Spastic hemiparesis affecting nondominant side 11/08/2014  . Hypothyroidism 10/27/2014  . Left-sided neglect 09/23/2014  . Acute ischemic right MCA stroke 09/16/2014  . Left hemiparesis   . CVA (cerebral vascular accident)   . Right carotid artery occlusion   . Acute right MCA stroke   . Essential hypertension 09/12/2014  . CVA (cerebral infarction) 09/12/2014  . Type 2 diabetes mellitus with peripheral neuropathy 09/12/2014    Billie Ruddy, PT, DPT [facility] [address] [place], [place], [address] Phone: [phone]   Fax:  [phone] 04/25/2015, 4:01 PM

## 2015-04-28 ENCOUNTER — Ambulatory Visit: Payer: 59 | Admitting: Physical Therapy

## 2015-04-28 ENCOUNTER — Ambulatory Visit: Payer: 59 | Admitting: Physical Medicine & Rehabilitation

## 2015-04-28 ENCOUNTER — Encounter: Payer: 59 | Admitting: Occupational Therapy

## 2015-04-28 DIAGNOSIS — R269 Unspecified abnormalities of gait and mobility: Secondary | ICD-10-CM

## 2015-04-28 DIAGNOSIS — G8194 Hemiplegia, unspecified affecting left nondominant side: Secondary | ICD-10-CM

## 2015-04-28 DIAGNOSIS — R2681 Unsteadiness on feet: Secondary | ICD-10-CM

## 2015-04-28 NOTE — Therapy (Signed)
[facility] [address] [place], [place], [address] Phone: [phone]   Fax:  [phone]  Physical Therapy Treatment  Patient Details  Name: Brian Hall MRN: [id number] Date of Birth: 1956-06-12 Referring Provider:  Claretta Fraise, MD  Encounter Date: 04/28/2015      PT End of Session - 04/28/15 1715    Visit Number 36   Number of Visits 27   Date for PT Re-Evaluation 06/04/15   PT Start Time 2330   PT Stop Time 1528   PT Time Calculation (min) 43 min   Equipment Utilized During Treatment Gait belt   Activity Tolerance Patient tolerated treatment well   Behavior During Therapy [facility] for tasks assessed/performed      Past Medical History  Diagnosis Date  . Hypertension   . Diabetes mellitus   . Obesity   . CHF (congestive heart failure)   . SOB (shortness of breath)   . Hyperkalemia   . Renal insufficiency   . Cardiogenic shock   . Cardiac LV ejection fraction 10-20%   . Hyperlipemia   . Hypothyroidism   . Throat cancer 2011    s/p neck dissection, radiation, chemo  . Stroke   . Blurred vision   . Joint pain   . Joint swelling     Past Surgical History  Procedure Laterality Date  . Cardiac catheterization    . Neck dissection Right 2011    s/p resection of throat cancer with multiple nodes removed    There were no vitals filed for this visit.  Visit Diagnosis:  Abnormality of gait  Hemiplegia affecting left nondominant side  Unsteadiness      Subjective Assessment - 04/28/15 1701    Subjective Pt reports feeling "set back" due to new partial calf tear.  Note that pt is to wear CAM boot at all times and no ROM for 4 weeks, pt aware.  Also reports increased pain with walking.     Patient is accompained by: Family member   Pertinent History CVA with L hemiparesis 09/2014,  no stretching for 4 weeks (9/21)   Limitations Standing;Walking;House hold activities   Patient Stated Goals Pt's  goal is to get back to walking and using L hand again.  He has a motorcycle and would like to ride it again.    Currently in Pain? Yes   Pain Score 1   when not walking, 5/10 when walking)   Pain Location Calf   Pain Orientation Left   Pain Descriptors / Indicators Aching;Throbbing   Pain Type Acute pain   Pain Onset In the past 7 days   Pain Frequency Intermittent   Aggravating Factors  WB, walking   Pain Relieving Factors NWB                         [facility] Adult PT Treatment/Exercise - 04/28/15 1712    Transfers   Transfers Sit to Stand;Stand to Sit;Stand Pivot Transfers   Sit to Stand 6: Modified independent (Device/Increase time)   Stand to Sit 6: Modified independent (Device/Increase time)   Stand Pivot Transfers 6: Modified independent (Device/Increase time)   Number of Reps 2 sets   Ambulation/Gait   Ambulation/Gait Yes   Ambulation/Gait Assistance 5: Supervision;4: Min guard   Ambulation/Gait Assistance Details cues for upright posture and improved stepping sequence.    Ambulation Distance (Feet) 225 Feet  2 x115' without AD; x80 with [facility]   Assistive device  Small based quad cane   Gait Pattern Step-through pattern;Decreased arm swing - left;Decreased stance time - left;Decreased hip/knee flexion - left;Decreased weight shift to left;Left foot flat;Lateral trunk lean to right;Trunk rotated posteriorly on left;Poor foot clearance - left   Stairs Yes   Stairs Assistance 4: Min guard   Stairs Assistance Details (indicate cue type and reason) Cues for safe LLE placement now that in boot   Stair Management Technique One rail Right;Step to pattern;Forwards   Number of Stairs 8   Height of Stairs 6   Neuro Re-ed    Neuro Re-ed Details  --                PT Education - 04/28/15 1710    Education provided Yes   Education Details Educated on continuing to be ambulatory at home with S of wife, performing HEP of L PNF pattern and single LLE bridging off  EOB.  Discussed safety during gait and stair training with boot.    Person(s) Educated Patient;Spouse   Methods Explanation;Demonstration;Verbal cues   Comprehension Verbalized understanding;Returned demonstration          PT Short Term Goals - 04/11/15 1658    PT SHORT TERM GOAL #1   Title Pt will perform HEP with supervision of family, for improved strength and balance. (Target 01/10/15)   Time 4   Period Weeks   Status Achieved   PT SHORT TERM GOAL #2   Title Pt will improve Timed UP and Go score to less than or equal to 35 seconds for decreased fall risk.   Baseline Discontinued due to Timed up and Go Test reinforcing increased velocity of movement, which increases spasticity and decreases quality of movement.   Time 4   Period Weeks   Status Achieved   PT SHORT TERM GOAL #3   Title Berg Balance score to be assessed, with pt scoring at least 5 additional points to decrease fall risk.   Time 4   Period Weeks   Status Achieved  Scored 33 today   PT SHORT TERM GOAL #4   Title Pt will perform at least 6 of 10 reps of sit<>stand transfers no UE support, with equal weightbearing through bilateral lower extremities, independently.   Time 4   Period Weeks   Status Achieved   PT SHORT TERM GOAL #5   Title Pt will improve TUG time from 29.69 sec to 25 seconds for decreased fall risk. Target date: 03/08/15.   Baseline Discontinued due to Timed up and Go Test reinforcing increased velocity of movement, which increases spasticity and decreases quality of movement.   Time 4   Period Weeks   Status Deferred   PT SHORT TERM GOAL #6   Title Pt will ambulate x200' over level surfaces with [facility] and supervision with no overt LOB. Target date: 03/08/15.   Baseline Achieved 03/07/15.   Time 4   Period Weeks   Status Achieved           PT Long Term Goals - 04/11/15 1659    PT LONG TERM GOAL #1   Title Pt will verbalize understanding of fall prevention within home environment. Target 02/10/15    Time 8   Period Weeks   Status Achieved   PT LONG TERM GOAL #2   Title Pt will improve TUG to less than or equal to 25 seconds for decreased fall risk.   Baseline Goal discontinued, as the nature of TUG promotes increased velocity of movement, which increases  spasticity and has negative impact on quality of movement.  7/12: 31.87 sec with SBQC   Time 8   Status Deferred  Discontinued; see above.   PT LONG TERM GOAL #3   Title Pt will improve gait velocity to at least 1 ft/sec for improved gait efficiency and safety.   Time 8   Period Weeks   Status Achieved   PT LONG TERM GOAL #4   Title Pt will ambulate at least 50-100 ft using hemiwalker, household distances, with supervision.   Time 8   Period Weeks   Status Achieved   PT LONG TERM GOAL #5   Title Pt will ambulate 150' over outdoor, unlevel surfaces with LRAD and supervision to indicate increased stability/independence with community mobility. Target date: 04/05/15.   Baseline Met 04/03/15 with pt using SBQC, requiring 25% cueing for safe gait velocity.   Time 8   Period Weeks   Status Achieved   PT LONG TERM GOAL #6   Title Pt will negotiate 4 stairs with single rail and supervision to increase pt independence with community mobility. Target date: 04/05/15.   Baseline Achieved 7/29.   Time 8   Period Weeks   Status Achieved   PT LONG TERM GOAL #7   Title Pt will increase gait speed from 1.02 ft/sec to 1.62 ft/sec to indicate significant improvement in efficiency of ambulation. Modified Target date: 05/03/15   Baseline Continue as STG through renewed POC.   Time 8   Period Weeks   Status On-going  8/3: 1.47 ft/sec with SBQC   PT LONG TERM GOAL #8   Title Pt will improve Berg score from  42 to 48/56 to indicate decreased fall risk and significant improvement in functional standing balance. Target date: 05/31/15   Status On-going   PT LONG TERM GOAL  #9   TITLE Pt will improve score on Postural Assessment Scale for Stroke  (PASS) from 30/36 to 33/36 to indicate significantly improved ability to maintain stability during positional change.  Target date: 05/31/15   Status On-going   PT LONG TERM GOAL  #10   TITLE Pt will negotiate standard curb step and standard ramp with mod I using LRAD to indicate increased safety/independence traversing community obstalces.  Target date: 05/31/15   Status On-going   PT LONG TERM GOAL  #11   TITLE Pt will ambulate 300' over unlevel paved surfaces with mod I using LRAD to indicate increased stability with limited community mobility.  Target date: 05/31/15.   Status On-going               Plan - 04/28/15 1716    Clinical Impression Statement Skilled session focused on safety with gait and stairs with use of SBQC and CAM boot.  Discussed holding therapy for next 4 weeks until able to remove CAM boot for to continue more appropriate therapy and progress.     Pt will benefit from skilled therapeutic intervention in order to improve on the following deficits Abnormal gait;Decreased activity tolerance;Decreased balance;Decreased mobility;Decreased range of motion;Decreased safety awareness;Difficulty walking;Decreased strength;Impaired tone;Decreased knowledge of use of DME;Decreased endurance;Decreased coordination;Impaired perceived functional ability;Postural dysfunction;Impaired sensation;Impaired UE functional use;Impaired vision/preception;Decreased cognition;Pain   Rehab Potential Good   PT Frequency 2x / week   PT Duration 8 weeks   PT Treatment/Interventions ADLs/Self Care Home Management;Therapeutic activities;Functional mobility training;Stair training;Gait training;Therapeutic exercise;Balance training;Neuromuscular re-education;Patient/family education;DME Instruction;Cognitive remediation;Orthotic Fit/Training;Manual techniques;Passive range of motion;Visual/perceptual remediation/compensation   PT Next Visit Plan Pending MD orders after 9/21 for removal  of CAM boot and  continuation of therapies.     Consulted and Agree with Plan of Care Patient;Family member/caregiver   Family Member Consulted wife, [name]        Problem List Patient Active Problem List   Diagnosis Date Noted  . Spastic hemiparesis affecting nondominant side 11/08/2014  . Hypothyroidism 10/27/2014  . Left-sided neglect 09/23/2014  . Acute ischemic right MCA stroke 09/16/2014  . Left hemiparesis   . CVA (cerebral vascular accident)   . Right carotid artery occlusion   . Acute right MCA stroke   . Essential hypertension 09/12/2014  . CVA (cerebral infarction) 09/12/2014  . Type 2 diabetes mellitus with peripheral neuropathy 09/12/2014    Billie Ruddy, PT, DPT [facility] [address] [place], [place], [address] Phone: [phone]   Fax:  [phone] 04/28/2015, 5:20 PM

## 2015-05-01 ENCOUNTER — Encounter: Payer: 59 | Admitting: Occupational Therapy

## 2015-05-01 ENCOUNTER — Ambulatory Visit: Payer: 59 | Admitting: Physical Therapy

## 2015-05-05 ENCOUNTER — Encounter: Payer: 59 | Admitting: Occupational Therapy

## 2015-05-05 ENCOUNTER — Ambulatory Visit: Payer: 59 | Admitting: Physical Therapy

## 2015-05-09 ENCOUNTER — Encounter: Payer: Self-pay | Admitting: Occupational Therapy

## 2015-05-09 ENCOUNTER — Ambulatory Visit: Payer: 59 | Attending: Physical Medicine & Rehabilitation | Admitting: Occupational Therapy

## 2015-05-09 DIAGNOSIS — R414 Neurologic neglect syndrome: Secondary | ICD-10-CM | POA: Insufficient documentation

## 2015-05-09 DIAGNOSIS — G8110 Spastic hemiplegia affecting unspecified side: Secondary | ICD-10-CM | POA: Insufficient documentation

## 2015-05-09 DIAGNOSIS — R201 Hypoesthesia of skin: Secondary | ICD-10-CM | POA: Diagnosis present

## 2015-05-09 DIAGNOSIS — R269 Unspecified abnormalities of gait and mobility: Secondary | ICD-10-CM | POA: Insufficient documentation

## 2015-05-09 DIAGNOSIS — G8194 Hemiplegia, unspecified affecting left nondominant side: Secondary | ICD-10-CM | POA: Insufficient documentation

## 2015-05-09 DIAGNOSIS — Z7409 Other reduced mobility: Secondary | ICD-10-CM

## 2015-05-09 DIAGNOSIS — R2681 Unsteadiness on feet: Secondary | ICD-10-CM | POA: Diagnosis present

## 2015-05-09 DIAGNOSIS — G8114 Spastic hemiplegia affecting left nondominant side: Secondary | ICD-10-CM

## 2015-05-09 DIAGNOSIS — R4189 Other symptoms and signs involving cognitive functions and awareness: Secondary | ICD-10-CM

## 2015-05-09 NOTE — Patient Instructions (Signed)
Modified HEP to include place and hold with PVC square

## 2015-05-09 NOTE — Therapy (Signed)
[facility] [address] [place], [place], [address] Phone: [phone]   Fax:  [phone]  Occupational Therapy Treatment  Patient Details  Name: Brian Hall MRN: [id number] Date of Birth: 05-Nov-1955 Referring Provider:  Claretta Fraise, MD  Encounter Date: 05/09/2015      OT End of Session - 05/09/15 1629    Visit Number 34   Number of Visits 39   Date for OT Re-Evaluation 06/06/15   Authorization Type [employer]   OT Start Time 1445   OT Stop Time 25   OT Time Calculation (min) 43 min      Past Medical History  Diagnosis Date  . Hypertension   . Diabetes mellitus   . Obesity   . CHF (congestive heart failure)   . SOB (shortness of breath)   . Hyperkalemia   . Renal insufficiency   . Cardiogenic shock   . Cardiac LV ejection fraction 10-20%   . Hyperlipemia   . Hypothyroidism   . Throat cancer 2011    s/p neck dissection, radiation, chemo  . Stroke   . Blurred vision   . Joint pain   . Joint swelling     Past Surgical History  Procedure Laterality Date  . Cardiac catheterization    . Neck dissection Right 2011    s/p resection of throat cancer with multiple nodes removed    There were no vitals filed for this visit.  Visit Diagnosis:  Left spastic hemiplegia  Impaired sensation  Impaired cognition  Impaired functional mobility and activity tolerance      Subjective Assessment - 05/09/15 1446    Subjective  My hand is more relaxed and I can move my arm easier   Patient is accompained by: Family member  wife   Pertinent History see epic snapshot   Patient Stated Goals Get back to doing some of the things I used to do.   Currently in Pain? Yes   Pain Score 4    Pain Location Calf   Pain Orientation Left   Pain Descriptors / Indicators Sore   Pain Type Acute pain   Pain Onset 1 to 4 weeks ago   Pain Frequency Constant   Aggravating Factors  nothing really makes it worse   Pain  Relieving Factors pt wearing boot on foot for at least 2 more weeks;  pain meds,    Multiple Pain Sites Yes   Pain Score 2   Pain Location Wrist   Pain Orientation Left   Pain Descriptors / Indicators Sore   Pain Type Chronic pain   Pain Onset More than a month ago   Pain Frequency Intermittent   Aggravating Factors  leaning on it, sleeping wrong on it   Pain Relieving Factors brace, support, rest                      OT Treatments/Exercises (OP) - 05/09/15 0001    Neurological Re-education Exercises   Other Exercises 1 Neuro re ed to upgrade HEP - pt returns today after a temporary hold due to botox injection. Pt with decreaed tone in L hand and arm.  Pt to continue with same program with PVC square except place and hold introduced - pt and wife able to return demonstrate. Also worked in standing to address low unilateral reach in standing with LUE in closed chain activity with open hand - pt able to over ride flexor synergy pattern in low reach and  perform reach without elbow flexion, IR and fisted hand. Pt requires max vc's in order to grade effort in RUE appropriate for task - pt benefits from visual feedback (i.e. fisted hand) if he over exerts.  Pt with improved isolated elbow flexion with activities as well as less fisting of hand.                  OT Education - 05/09/15 1625    Education provided Yes   Education Details upgraded HEP to include place and hold with PVC square   Person(s) Educated Patient;Spouse   Methods Explanation;Demonstration;Tactile cues;Verbal cues;Handout   Comprehension Verbalized understanding;Returned demonstration          OT Short Term Goals - 05/09/15 1627    OT SHORT TERM GOAL  #9   Status --  pt is now stepping over ledge and into shower wtih min with wife to sit on seat   OT SHORT TERM GOAL  #11   TITLE Pt and wife will be mod I with upgraded HEP - 05/09/2015   Status On-going   OT SHORT TERM GOAL  #12   TITLE Pt will  be supevision for simple hot meal prep   Status On-going   OT SHORT TERM GOAL  #13   TITLE Pt will demonstrate ability for bilateral mid reach with light object without facilitation.   Status On-going           OT Long Term Goals - 05/09/15 1626    OT LONG TERM GOAL #8   Title Pt will demonstrate the ability to use LUE as gross a for basic ADL's 100% of the time - 06/06/2015   Status On-going  able to use for bathing   OT LONG TERM GOAL  #10   TITLE Pt will be supervision  for simple hot meal prep   Status On-going   OT LONG TERM GOAL  #11   Status --  with min facilitation and min compensations   OT LONG TERM GOAL  #12   TITLE Pt will demonstrate ability to release objects with mod facilitation.    Status On-going   OT LONG TERM GOAL  #13   TITLE Pt will demonstrate ability for unilateral low reach with assist for gross grasp with mn compensations.   Status On-going   OT LONG TERM GOAL  #14   TITLE Pt will be mod I with upgraded HEP prn   Status On-going               Plan - 05/09/15 1627    Clinical Impression Statement Pt with progress toward goals - pt with decreaed tone and able to better over ride and grade effort behind LUE in low reach. Able to upgrade HEP - see pt instruction for details.    Pt will benefit from skilled therapeutic intervention in order to improve on the following deficits (Retired) Decreased balance;Decreased coordination;Decreased cognition;Decreased knowledge of use of DME;Decreased mobility;Decreased range of motion;Decreased safety awareness;Difficulty walking;Decreased strength;Increased edema;Increased muscle spasms;Impaired UE functional use;Impaired tone;Impaired sensation;Impaired vision/preception;Pain   Rehab Potential Good   Clinical Impairments Affecting Rehab Potential Pt will benefit from skilled OT to address deficits outlined in eval to maximize independence.   OT Frequency 2x / week   OT Duration 8 weeks   OT  Treatment/Interventions Self-care/ADL training;Cryotherapy;Moist Heat;Electrical Stimulation;Fluidtherapy;DME and/or AE instruction;Energy conservation;Neuromuscular education;Therapeutic exercise;Functional Mobility Training;Manual Therapy;Passive range of motion;Splinting;Therapeutic activities;Balance training;Patient/family education;Cognitive remediation/compensation   Plan check HEP, NMR for low reach with open  hand and elbow extension, low bilateral reach as well   OT Home Exercise Plan upgraded HEP to include place and hold   Consulted and Agree with Plan of Care Patient;Family member/caregiver   Family Member Consulted wife        Problem List Patient Active Problem List   Diagnosis Date Noted  . Spastic hemiparesis affecting nondominant side 11/08/2014  . Hypothyroidism 10/27/2014  . Left-sided neglect 09/23/2014  . Acute ischemic right MCA stroke 09/16/2014  . Left hemiparesis   . CVA (cerebral vascular accident)   . Right carotid artery occlusion   . Acute right MCA stroke   . Essential hypertension 09/12/2014  . CVA (cerebral infarction) 09/12/2014  . Type 2 diabetes mellitus with peripheral neuropathy 09/12/2014    Quay Burow, OTR/L 05/09/2015, 4:30 PM  [facility] [address] [place], [place], [address] Phone: [phone]   Fax:  [phone]

## 2015-05-11 ENCOUNTER — Other Ambulatory Visit: Payer: Self-pay | Admitting: Family Medicine

## 2015-05-11 ENCOUNTER — Ambulatory Visit: Payer: 59 | Admitting: Physical Medicine & Rehabilitation

## 2015-05-11 ENCOUNTER — Ambulatory Visit: Payer: 59 | Admitting: Occupational Therapy

## 2015-05-11 ENCOUNTER — Encounter: Payer: Self-pay | Admitting: Occupational Therapy

## 2015-05-11 DIAGNOSIS — G8114 Spastic hemiplegia affecting left nondominant side: Secondary | ICD-10-CM

## 2015-05-11 DIAGNOSIS — G8110 Spastic hemiplegia affecting unspecified side: Secondary | ICD-10-CM | POA: Diagnosis not present

## 2015-05-11 DIAGNOSIS — R4189 Other symptoms and signs involving cognitive functions and awareness: Secondary | ICD-10-CM

## 2015-05-11 DIAGNOSIS — R201 Hypoesthesia of skin: Secondary | ICD-10-CM

## 2015-05-11 DIAGNOSIS — Z7409 Other reduced mobility: Secondary | ICD-10-CM

## 2015-05-11 NOTE — Therapy (Signed)
[facility] [address] [place], [place], [address] Phone: [phone]   Fax:  [phone]  Occupational Therapy Treatment  Patient Details  Name: Brian Hall MRN: [id number] Date of Birth: 12-19-1955 Referring Provider:  Claretta Fraise, MD  Encounter Date: 05/11/2015      OT End of Session - 05/11/15 1648    Visit Number 35   Number of Visits 42   Date for OT Re-Evaluation 06/20/15   Authorization Type [employer]   OT Start Time 1402   OT Stop Time 1445   OT Time Calculation (min) 43 min   Activity Tolerance Patient tolerated treatment well      Past Medical History  Diagnosis Date  . Hypertension   . Diabetes mellitus   . Obesity   . CHF (congestive heart failure)   . SOB (shortness of breath)   . Hyperkalemia   . Renal insufficiency   . Cardiogenic shock   . Cardiac LV ejection fraction 10-20%   . Hyperlipemia   . Hypothyroidism   . Throat cancer 2011    s/p neck dissection, radiation, chemo  . Stroke   . Blurred vision   . Joint pain   . Joint swelling     Past Surgical History  Procedure Laterality Date  . Cardiac catheterization    . Neck dissection Right 2011    s/p resection of throat cancer with multiple nodes removed    There were no vitals filed for this visit.  Visit Diagnosis:  Left spastic hemiplegia  Impaired sensation  Impaired cognition  Impaired functional mobility and activity tolerance      Subjective Assessment - 05/11/15 1404    Subjective  My balance isn't as good with this boot on my foot   Patient is accompained by: Family member  wife   Pertinent History see epic snapshot   Patient Stated Goals Get back to doing some of the things I used to do.   Currently in Pain? Yes   Pain Score 4    Pain Location Calf   Pain Orientation Right   Pain Descriptors / Indicators Sore   Pain Type Acute pain   Pain Onset 1 to 4 weeks ago   Pain Frequency Constant    Aggravating Factors  nothing makes it worse   Pain Relieving Factors pt wearing boot at least 2 more weeks; pain meds                      OT Treatments/Exercises (OP) - 05/11/15 0001    Neurological Re-education Exercises   Other Exercises 1 Neuro re ed in high kneeling with LUE in weight bearing to use as stabilizer with reaching activities with RUE.- pt needed min facilitation due to increased tone. Also  addressed trunk control relative to scapular alignment in preparation for bilateral reaching activities.  Addressed low reach with emphasis on elbow extension and open hand - pt did not require as much assist today for this after practice and repetition.  Pt continues to need to work on grading force, direction of movement and timing of movement.                   OT Short Term Goals - 05/11/15 1643    OT SHORT TERM GOAL  #9   Status --  pt is now stepping over ledge and into shower wtih min with wife to sit on seat   OT SHORT TERM GOAL  #  11   TITLE Pt and wife will be mod I with upgraded HEP - 05/23/2015 (date adjusted as pt missed 2 weeks of therapy)   Status On-going   OT SHORT TERM GOAL  #12   TITLE Pt will be supevision for simple hot meal prep   Status On-going   OT SHORT TERM GOAL  #13   TITLE Pt will demonstrate ability for bilateral mid reach with light object without facilitation.   Status On-going           OT Long Term Goals - 05/11/15 1644    OT LONG TERM GOAL #8   Title Pt will demonstrate the ability to use LUE as gross a for basic ADL's 100% of the time - 06/20/2015 (date adjusted as pt missed two weeks of therapy)   Status On-going  able to use for bathing   OT LONG TERM GOAL  #10   TITLE Pt will be supervision  for simple hot meal prep   Status On-going   OT LONG TERM GOAL  #11   Status --  with min facilitation and min compensations   OT LONG TERM GOAL  #12   TITLE Pt will demonstrate ability to release objects with mod  facilitation.    Status On-going   OT LONG TERM GOAL  #13   TITLE Pt will demonstrate ability for unilateral low reach with assist for gross grasp with mn compensations.   Status On-going   OT LONG TERM GOAL  #14   TITLE Pt will be mod I with upgraded HEP prn   Status On-going               Plan - 05/11/15 1646    Clinical Impression Statement Pt making progress with better ability to adjust force behind LUE in order to better control tone and improve ability to use more isolated movement - pt able to maintain open hand with low reach today in closed chain activity with minimal assistance.    Pt will benefit from skilled therapeutic intervention in order to improve on the following deficits (Retired) Decreased balance;Decreased coordination;Decreased cognition;Decreased knowledge of use of DME;Decreased mobility;Decreased range of motion;Decreased safety awareness;Difficulty walking;Decreased strength;Increased edema;Increased muscle spasms;Impaired UE functional use;Impaired tone;Impaired sensation;Impaired vision/preception;Pain   Rehab Potential Good   Clinical Impairments Affecting Rehab Potential Pt will benefit from skilled OT to address deficits outlined in eval to maximize independence.   OT Frequency 2x / week   OT Duration 8 weeks   OT Treatment/Interventions Self-care/ADL training;Cryotherapy;Moist Heat;Electrical Stimulation;Fluidtherapy;DME and/or AE instruction;Energy conservation;Neuromuscular education;Therapeutic exercise;Functional Mobility Training;Manual Therapy;Passive range of motion;Splinting;Therapeutic activities;Balance training;Patient/family education;Cognitive remediation/compensation   Plan NMR for low to mid reach with open hand and elbow extension, low bilateral reach as well   OT Home Exercise Plan upgraded HEP to include place and hold   Consulted and Agree with Plan of Care Patient;Family member/caregiver   Family Member Consulted wife         Problem List Patient Active Problem List   Diagnosis Date Noted  . Spastic hemiparesis affecting nondominant side 11/08/2014  . Hypothyroidism 10/27/2014  . Left-sided neglect 09/23/2014  . Acute ischemic right MCA stroke 09/16/2014  . Left hemiparesis   . CVA (cerebral vascular accident)   . Right carotid artery occlusion   . Acute right MCA stroke   . Essential hypertension 09/12/2014  . CVA (cerebral infarction) 09/12/2014  . Type 2 diabetes mellitus with peripheral neuropathy 09/12/2014    Quay Burow, OTR/L 05/11/2015, 4:49  PM  [facility] [address] [place], [place], [address] Phone: [phone]   Fax:  [phone]

## 2015-05-12 ENCOUNTER — Telehealth: Payer: Self-pay | Admitting: Family Medicine

## 2015-05-12 NOTE — Telephone Encounter (Signed)
Covering for PCP, Dr. Livia Snellen.   Refilled tramadol.   Will ask nursing to inform.   Laroy Apple, MD [facility] 05/12/2015, 9:10 AM

## 2015-05-12 NOTE — Telephone Encounter (Signed)
Last seen 04/11/15 DR Stacks   If approved print

## 2015-05-14 NOTE — Telephone Encounter (Signed)
Check for coupons for onglyza or trajenta

## 2015-05-15 ENCOUNTER — Ambulatory Visit: Payer: 59 | Admitting: Occupational Therapy

## 2015-05-15 ENCOUNTER — Encounter: Payer: Self-pay | Admitting: Occupational Therapy

## 2015-05-15 DIAGNOSIS — R201 Hypoesthesia of skin: Secondary | ICD-10-CM

## 2015-05-15 DIAGNOSIS — G8114 Spastic hemiplegia affecting left nondominant side: Secondary | ICD-10-CM

## 2015-05-15 DIAGNOSIS — R4189 Other symptoms and signs involving cognitive functions and awareness: Secondary | ICD-10-CM

## 2015-05-15 DIAGNOSIS — Z7409 Other reduced mobility: Secondary | ICD-10-CM

## 2015-05-15 DIAGNOSIS — G8194 Hemiplegia, unspecified affecting left nondominant side: Secondary | ICD-10-CM

## 2015-05-15 DIAGNOSIS — G8110 Spastic hemiplegia affecting unspecified side: Secondary | ICD-10-CM | POA: Diagnosis not present

## 2015-05-15 MED ORDER — LINAGLIPTIN 5 MG PO TABS: 5.0000 mg | ORAL_TABLET | Freq: Every day | ORAL | Status: DC

## 2015-05-15 NOTE — Telephone Encounter (Signed)
Tradjenta sent into pharmacy Okayed per Dr Livia Snellen Pt's wife notified

## 2015-05-15 NOTE — Telephone Encounter (Signed)
See if we have coupons for Tradjenta or Onglyza that he could use instead. Either one could be substituted for Januvia with no problem.

## 2015-05-15 NOTE — Telephone Encounter (Signed)
We can get a coupon for either one. What would you like to switch to?

## 2015-05-15 NOTE — Telephone Encounter (Signed)
Tradjenta, 5 mg qday. See if we can get a patient assistance approval for them as well from the company.

## 2015-05-15 NOTE — Therapy (Signed)
[facility] [address] [place], [place], [address] Phone: [phone]   Fax:  [phone]  Occupational Therapy Treatment  Patient Details  Name: Brian Hall MRN: [id number] Date of Birth: 26-Mar-1956 Referring Provider:  Claretta Fraise, MD  Encounter Date: 05/15/2015      OT End of Session - 05/15/15 1428    Visit Number 36   Number of Visits 87   Date for OT Re-Evaluation 06/20/15   Authorization Type [employer]   OT Start Time 1315   OT Stop Time 1359   OT Time Calculation (min) 44 min   Activity Tolerance Patient tolerated treatment well      Past Medical History  Diagnosis Date  . Hypertension   . Diabetes mellitus   . Obesity   . CHF (congestive heart failure)   . SOB (shortness of breath)   . Hyperkalemia   . Renal insufficiency   . Cardiogenic shock   . Cardiac LV ejection fraction 10-20%   . Hyperlipemia   . Hypothyroidism   . Throat cancer 2011    s/p neck dissection, radiation, chemo  . Stroke   . Blurred vision   . Joint pain   . Joint swelling     Past Surgical History  Procedure Laterality Date  . Cardiac catheterization    . Neck dissection Right 2011    s/p resection of throat cancer with multiple nodes removed    There were no vitals filed for this visit.  Visit Diagnosis:  Left spastic hemiplegia  Impaired sensation  Impaired cognition  Hemiplegia affecting left nondominant side  Impaired functional mobility and activity tolerance      Subjective Assessment - 05/15/15 1323    Subjective  I get tired easy walking in this boot   Patient is accompained by: Family member  wife   Pertinent History see epic snapshot   Patient Stated Goals Get back to doing some of the things I used to do.   Currently in Pain? Yes   Pain Score 3    Pain Location Back   Pain Orientation Lower   Pain Descriptors / Indicators Sore   Pain Type Acute pain   Pain Onset Today   Pain  Frequency Constant   Aggravating Factors  sleeping in recliner chair, possbily walking in boot   Pain Relieving Factors meds, heat, rest   Multiple Pain Sites Yes   Pain Score 2   Pain Location Calf   Pain Orientation Left   Pain Descriptors / Indicators Sore   Pain Type Acute pain   Pain Onset 1 to 4 weeks ago   Pain Frequency Constant   Aggravating Factors  taking brace on and off, walking and standing   Pain Relieving Factors staying off of it.   Pain Score 2   Pain Location Wrist   Pain Orientation Left   Pain Type Chronic pain   Pain Onset More than a month ago   Pain Frequency Intermittent   Aggravating Factors  certain movements   Pain Relieving Factors brace, heat, meds                      OT Treatments/Exercises (OP) - 05/15/15 0001    ADLs   LB Dressing contact guard for balance due to boot on LLE for torn calf muscle. Pt required 3 rest breaks with 2/4 on dyspnea scale.  Wife present.  Discussed and practiced use of rolling cart - wife stated  she is going to purchase one "as soon as I get the time." Wife also caring for mother.     Cooking Addressed simple hot meal prep at ambulatory level.  Pt required mod vc's for safety, body positioning relative to the task, and cues to slow down for safety and to try to incoporate LUE into task. Pt used LUE 20% of task with min assist. Pt required                   OT Short Term Goals - 05/15/15 1423    OT SHORT TERM GOAL  #9   Status --  pt is now stepping over ledge and into shower wtih min with wife to sit on seat   OT SHORT TERM GOAL  #11   TITLE Pt and wife will be mod I with upgraded HEP - 05/23/2015 (date adjusted as pt missed 2 weeks of therapy)   Status Achieved   OT SHORT TERM GOAL  #12   TITLE Pt will be supevision for simple hot meal prep   Status On-going   OT SHORT TERM GOAL  #13   TITLE Pt will demonstrate ability for bilateral mid reach with light object without facilitation.   Status  On-going           OT Long Term Goals - 05/15/15 1424    OT LONG TERM GOAL #8   Title Pt will demonstrate the ability to use LUE as gross a for basic ADL's 100% of the time - 06/20/2015 (date adjusted as pt missed two weeks of therapy)   Status On-going  able to use for bathing   OT LONG TERM GOAL  #10   TITLE Pt will be mod I for simple hot meal prep   Status Revised   OT LONG TERM GOAL  #11   TITLE Pt will demonstrate ability for functional tasks at ambulatory level for 10 minutes without needing rest break.   Status New  with min facilitation and min compensations   OT LONG TERM GOAL  #12   TITLE Pt will demonstrate ability to release objects with mod facilitation.    Status On-going   OT LONG TERM GOAL  #13   TITLE Pt will demonstrate ability for unilateral low reach with assist for gross grasp with mn compensations.   Status On-going   OT LONG TERM GOAL  #14   TITLE Pt will be mod I with upgraded HEP prn   Status On-going               Plan - 05/15/15 1426    Clinical Impression Statement Pt with slow progress toward goals. Pt's functional mobility has been impacted by torn calf muscle. Pt also with poor activity tolerance. Pt continues to require cues to slow down and concentrate on how the task is done, not just focus on completing the task.    Pt will benefit from skilled therapeutic intervention in order to improve on the following deficits (Retired) Decreased balance;Decreased coordination;Decreased cognition;Decreased knowledge of use of DME;Decreased mobility;Decreased range of motion;Decreased safety awareness;Difficulty walking;Decreased strength;Increased edema;Increased muscle spasms;Impaired UE functional use;Impaired tone;Impaired sensation;Impaired vision/preception;Pain   Rehab Potential Good   Clinical Impairments Affecting Rehab Potential Pt will benefit from skilled OT to address deficits outlined in eval to maximize independence.   OT Frequency 2x /  week   OT Duration 8 weeks   OT Treatment/Interventions Self-care/ADL training;Cryotherapy;Moist Heat;Electrical Stimulation;Fluidtherapy;DME and/or AE instruction;Energy conservation;Neuromuscular education;Therapeutic exercise;Functional Mobility Training;Manual Therapy;Passive range  of motion;Splinting;Therapeutic activities;Balance training;Patient/family education;Cognitive remediation/compensation   Plan NMR for low to mid reach with open hand and elbow extension, work toward low unilateral reach and mid bilateral reach   [place] upgraded HEP to include place and hold   Consulted and Agree with Plan of Care Patient;Family member/caregiver   Family Member Consulted wife        Problem List Patient Active Problem List   Diagnosis Date Noted  . Spastic hemiparesis affecting nondominant side 11/08/2014  . Hypothyroidism 10/27/2014  . Left-sided neglect 09/23/2014  . Acute ischemic right MCA stroke 09/16/2014  . Left hemiparesis   . CVA (cerebral vascular accident)   . Right carotid artery occlusion   . Acute right MCA stroke   . Essential hypertension 09/12/2014  . CVA (cerebral infarction) 09/12/2014  . Type 2 diabetes mellitus with peripheral neuropathy 09/12/2014    Quay Burow, OTR/L 05/15/2015, 2:32 PM  [facility] [address] [place], [place], [address] Phone: [phone]   Fax:  [phone]

## 2015-05-16 ENCOUNTER — Ambulatory Visit: Payer: 59 | Admitting: Occupational Therapy

## 2015-05-16 ENCOUNTER — Encounter: Payer: Self-pay | Admitting: Occupational Therapy

## 2015-05-16 DIAGNOSIS — G8110 Spastic hemiplegia affecting unspecified side: Secondary | ICD-10-CM | POA: Diagnosis not present

## 2015-05-16 DIAGNOSIS — G8194 Hemiplegia, unspecified affecting left nondominant side: Secondary | ICD-10-CM

## 2015-05-16 DIAGNOSIS — R201 Hypoesthesia of skin: Secondary | ICD-10-CM

## 2015-05-16 DIAGNOSIS — R4189 Other symptoms and signs involving cognitive functions and awareness: Secondary | ICD-10-CM

## 2015-05-16 DIAGNOSIS — G8114 Spastic hemiplegia affecting left nondominant side: Secondary | ICD-10-CM

## 2015-05-16 DIAGNOSIS — Z7409 Other reduced mobility: Secondary | ICD-10-CM

## 2015-05-16 NOTE — Therapy (Signed)
[facility] [address] [place], [place], [address] Phone: [phone]   Fax:  [phone]  Occupational Therapy Treatment  Patient Details  Name: Brian Hall MRN: [id number] Date of Birth: Aug 11, 1956 Referring Provider:  Claretta Fraise, MD  Encounter Date: 05/16/2015      OT End of Session - 05/16/15 1715    Visit Number 37   Number of Visits 27   Date for OT Re-Evaluation 06/20/15   Authorization Type [employer]   OT Start Time 1446   OT Stop Time 1529   OT Time Calculation (min) 43 min   Activity Tolerance Patient tolerated treatment well      Past Medical History  Diagnosis Date  . Hypertension   . Diabetes mellitus   . Obesity   . CHF (congestive heart failure)   . SOB (shortness of breath)   . Hyperkalemia   . Renal insufficiency   . Cardiogenic shock   . Cardiac LV ejection fraction 10-20%   . Hyperlipemia   . Hypothyroidism   . Throat cancer 2011    s/p neck dissection, radiation, chemo  . Stroke   . Blurred vision   . Joint pain   . Joint swelling     Past Surgical History  Procedure Laterality Date  . Cardiac catheterization    . Neck dissection Right 2011    s/p resection of throat cancer with multiple nodes removed    There were no vitals filed for this visit.  Visit Diagnosis:  Left spastic hemiplegia  Impaired sensation  Impaired cognition  Hemiplegia affecting left nondominant side  Impaired functional mobility and activity tolerance      Subjective Assessment - 05/16/15 1456    Patient is accompained by: Family member  wife   Pertinent History see epic snapshot   Patient Stated Goals Get back to doing some of the things I used to do.   Currently in Pain? Yes   Pain Score 2    Pain Location Calf   Pain Orientation Right   Pain Descriptors / Indicators Sore   Pain Type Acute pain   Pain Onset 1 to 4 weeks ago   Pain Frequency Intermittent   Aggravating Factors   sleeping in recliner chair, possibly walking in boot   Pain Relieving Factors meds, heat, rest                      OT Treatments/Exercises (OP) - 05/16/15 0001    Neurological Re-education Exercises   Other Exercises 1 Neuro re ed to address bilateral low to mid reach - pt able to reach today to 90* with min compensations and min facilitation. Pt continues to require cues  and faciliation to deter shoulder hike with shoulder flexion but this is improving.  Addressed grading of force, and reducing tone overflow as well as synergisic influence. Pt begining to work on low reach with unilateral activities with mod facilitation.                   OT Short Term Goals - 05/16/15 1713    OT SHORT TERM GOAL  #9   Status --  pt is now stepping over ledge and into shower wtih min with wife to sit on seat   OT SHORT TERM GOAL  #11   TITLE Pt and wife will be mod I with upgraded HEP - 05/23/2015 (date adjusted as pt missed 2 weeks of therapy)   Status Achieved  OT SHORT TERM GOAL  #12   TITLE Pt will be supevision for simple hot meal prep   Status On-going   OT SHORT TERM GOAL  #13   TITLE Pt will demonstrate ability for bilateral mid reach with light object without facilitation.   Status On-going           OT Long Term Goals - 05/16/15 1713    OT LONG TERM GOAL #8   Title Pt will demonstrate the ability to use LUE as gross a for basic ADL's 100% of the time - 06/20/2015 (date adjusted as pt missed two weeks of therapy)   Status On-going  able to use for bathing   OT LONG TERM GOAL  #10   TITLE Pt will be mod I for simple hot meal prep   Status Revised   OT LONG TERM GOAL  #11   TITLE Pt will demonstrate ability for functional tasks at ambulatory level for 10 minutes without needing rest break.   Status New  with min facilitation and min compensations   OT LONG TERM GOAL  #12   TITLE Pt will demonstrate ability to release objects with mod facilitation.     Status On-going   OT LONG TERM GOAL  #13   TITLE Pt will demonstrate ability for unilateral low reach with assist for gross grasp with mn compensations.   Status On-going   OT LONG TERM GOAL  #14   TITLE Pt will be mod I with upgraded HEP prn   Status On-going               Plan - 05/16/15 1714    Clinical Impression Statement Pt making making progress toward goals. Pt with increased awareness of abnormal movement patterns in RUE and slowly improving ability to grade force to reduce influence of synergistic movement.   Pt will benefit from skilled therapeutic intervention in order to improve on the following deficits (Retired) Decreased balance;Decreased coordination;Decreased cognition;Decreased knowledge of use of DME;Decreased mobility;Decreased range of motion;Decreased safety awareness;Difficulty walking;Decreased strength;Increased edema;Increased muscle spasms;Impaired UE functional use;Impaired tone;Impaired sensation;Impaired vision/preception;Pain   Rehab Potential Good   Clinical Impairments Affecting Rehab Potential Pt will benefit from skilled OT to address deficits outlined in eval to maximize independence.   OT Frequency 2x / week   OT Duration 8 weeks   OT Treatment/Interventions Self-care/ADL training;Cryotherapy;Moist Heat;Electrical Stimulation;Fluidtherapy;DME and/or AE instruction;Energy conservation;Neuromuscular education;Therapeutic exercise;Functional Mobility Training;Manual Therapy;Passive range of motion;Splinting;Therapeutic activities;Balance training;Patient/family education;Cognitive remediation/compensation   Plan NMR, address remaining STG's as possible   OT Home Exercise Plan upgraded HEP to include place and hold   Consulted and Agree with Plan of Care Patient;Family member/caregiver   Family Member Consulted wife        Problem List Patient Active Problem List   Diagnosis Date Noted  . Spastic hemiparesis affecting nondominant side 11/08/2014   . Hypothyroidism 10/27/2014  . Left-sided neglect 09/23/2014  . Acute ischemic right MCA stroke 09/16/2014  . Left hemiparesis   . CVA (cerebral vascular accident)   . Right carotid artery occlusion   . Acute right MCA stroke   . Essential hypertension 09/12/2014  . CVA (cerebral infarction) 09/12/2014  . Type 2 diabetes mellitus with peripheral neuropathy 09/12/2014    Quay Burow, OTR/L 05/16/2015, 5:16 PM  [facility] [address] [place], [place], [address] Phone: [phone]   Fax:  [phone]

## 2015-05-17 ENCOUNTER — Other Ambulatory Visit: Payer: Self-pay | Admitting: Family Medicine

## 2015-05-17 ENCOUNTER — Telehealth: Payer: Self-pay | Admitting: Family Medicine

## 2015-05-17 NOTE — Telephone Encounter (Signed)
TC back to pharmacy. The sent a refill request on 9/9 which was denied. Pt's written refill was still at front desk which was authorized on 9/8 by Dr. Wendi Snipes. Refill given to pharmacy. Written Rx destroyed in [employer] box

## 2015-05-18 ENCOUNTER — Encounter: Payer: 59 | Admitting: Occupational Therapy

## 2015-05-23 ENCOUNTER — Ambulatory Visit: Payer: 59 | Admitting: Occupational Therapy

## 2015-05-23 ENCOUNTER — Encounter: Payer: Self-pay | Admitting: Occupational Therapy

## 2015-05-23 DIAGNOSIS — R4189 Other symptoms and signs involving cognitive functions and awareness: Secondary | ICD-10-CM

## 2015-05-23 DIAGNOSIS — G8110 Spastic hemiplegia affecting unspecified side: Secondary | ICD-10-CM | POA: Diagnosis not present

## 2015-05-23 DIAGNOSIS — Z7409 Other reduced mobility: Secondary | ICD-10-CM

## 2015-05-23 DIAGNOSIS — R201 Hypoesthesia of skin: Secondary | ICD-10-CM

## 2015-05-23 DIAGNOSIS — G8194 Hemiplegia, unspecified affecting left nondominant side: Secondary | ICD-10-CM

## 2015-05-23 NOTE — Therapy (Signed)
[facility] [address] [place], [place], [address] Phone: [phone]   Fax:  [phone]  Occupational Therapy Treatment  Patient Details  Name: Brian Hall MRN: [id number] Date of Birth: Jan 02, 1956 Referring Provider:  Claretta Fraise, MD  Encounter Date: 05/23/2015      OT End of Session - 05/23/15 1416    Visit Number 38   Number of Visits 63   Date for OT Re-Evaluation 06/20/15   Authorization Type [employer]   OT Start Time 1315   OT Stop Time 1405   OT Time Calculation (min) 50 min      Past Medical History  Diagnosis Date  . Hypertension   . Diabetes mellitus   . Obesity   . CHF (congestive heart failure)   . SOB (shortness of breath)   . Hyperkalemia   . Renal insufficiency   . Cardiogenic shock   . Cardiac LV ejection fraction 10-20%   . Hyperlipemia   . Hypothyroidism   . Throat cancer 2011    s/p neck dissection, radiation, chemo  . Stroke   . Blurred vision   . Joint pain   . Joint swelling     Past Surgical History  Procedure Laterality Date  . Cardiac catheterization    . Neck dissection Right 2011    s/p resection of throat cancer with multiple nodes removed    There were no vitals filed for this visit.  Visit Diagnosis:  Hemiplegia affecting left nondominant side  Impaired functional mobility and activity tolerance  Impaired sensation  Impaired cognition      Subjective Assessment - 05/23/15 1321    Subjective  My boot isn't holding the inflation aspect so my leg hurts more.    Patient is accompained by: Family member  wife   Pertinent History see epic snapshot   Patient Stated Goals Get back to doing some of the things I used to do.   Currently in Pain? Yes   Pain Score 5    Pain Location Calf   Pain Orientation Left   Pain Descriptors / Indicators Sore   Pain Type Acute pain   Pain Onset 1 to 4 weeks ago   Pain Frequency Constant   Aggravating Factors   inflation in boot is not working properly - pt to see MD tomorrow.     Pain Relieving Factors meds, heat, rest   Multiple Pain Sites Yes   Pain Score 3   Pain Location Wrist   Pain Orientation Left   Pain Descriptors / Indicators Stabbing   Pain Type Chronic pain   Pain Onset More than a month ago   Pain Frequency Intermittent   Aggravating Factors  catching it, moving it too fast   Pain Relieving Factors meds, rest, support.                                  OT Short Term Goals - 05/23/15 1412    OT SHORT TERM GOAL  #9   Status --  pt is now stepping over ledge and into shower wtih min with wife to sit on seat   OT SHORT TERM GOAL  #11   TITLE Pt and wife will be mod I with upgraded HEP - 05/23/2015 (date adjusted as pt missed 2 weeks of therapy)   Status Achieved   OT SHORT TERM GOAL  #12   TITLE Pt will be  supevision for simple hot meal prep   Status On-going  deferred for now as pt is wearing a boot and therefore needs occassional steadying for balance. Will reassess when boot comes off   OT SHORT TERM GOAL  #13   TITLE Pt will demonstrate ability for bilateral mid reach with light object without facilitation.   Status Achieved           OT Long Term Goals - 05/23/15 1412    OT LONG TERM GOAL #8   Title Pt will demonstrate the ability to use LUE as gross a for basic ADL's 100% of the time - 06/20/2015 (date adjusted as pt missed two weeks of therapy)   Status On-going  able to use for bathing   OT LONG TERM GOAL  #10   TITLE Pt will be mod I for simple hot meal prep   Status Revised   OT LONG TERM GOAL  #11   TITLE Pt will demonstrate ability for functional tasks at ambulatory level for 10 minutes without needing rest break.   Status New  with min facilitation and min compensations   OT LONG TERM GOAL  #12   TITLE Pt will demonstrate ability to release objects with mod facilitation.    Status On-going   OT LONG TERM GOAL  #13   TITLE Pt  will demonstrate ability for unilateral low reach with assist for gross grasp with mn compensations.   Status On-going   OT LONG TERM GOAL  #14   TITLE Pt will be mod I with upgraded HEP prn   Status On-going               Plan - 05/23/15 1414    Clinical Impression Statement Pt with increased pain in L calf as boot inflation not operating properly. Pt to see MD tomorrow and instructed to stay off LLE as much as possible until he sees MD. Pt with increased tone today.    Pt will benefit from skilled therapeutic intervention in order to improve on the following deficits (Retired) Decreased balance;Decreased coordination;Decreased cognition;Decreased knowledge of use of DME;Decreased mobility;Decreased range of motion;Decreased safety awareness;Difficulty walking;Decreased strength;Increased edema;Increased muscle spasms;Impaired UE functional use;Impaired tone;Impaired sensation;Impaired vision/preception;Pain   Rehab Potential Good   Clinical Impairments Affecting Rehab Potential Pt will benefit from skilled OT to address deficits outlined in eval to maximize independence.   OT Frequency 2x / week   OT Duration 8 weeks   OT Treatment/Interventions Self-care/ADL training;Cryotherapy;Moist Heat;Electrical Stimulation;Fluidtherapy;DME and/or AE instruction;Energy conservation;Neuromuscular education;Therapeutic exercise;Functional Mobility Training;Manual Therapy;Passive range of motion;Splinting;Therapeutic activities;Balance training;Patient/family education;Cognitive remediation/compensation   Plan NMR, beginning working toward LTG's, low unilateral reach with assist for grasp, grasp and release   OT Home Exercise Plan upgraded HEP to include place and hold   Consulted and Agree with Plan of Care Patient;Family member/caregiver   Family Member Consulted wife        Problem List Patient Active Problem List   Diagnosis Date Noted  . Spastic hemiparesis affecting nondominant side  11/08/2014  . Hypothyroidism 10/27/2014  . Left-sided neglect 09/23/2014  . Acute ischemic right MCA stroke 09/16/2014  . Left hemiparesis   . CVA (cerebral vascular accident)   . Right carotid artery occlusion   . Acute right MCA stroke   . Essential hypertension 09/12/2014  . CVA (cerebral infarction) 09/12/2014  . Type 2 diabetes mellitus with peripheral neuropathy 09/12/2014    Quay Burow, OTR/L 05/23/2015, 2:17 PM  [facility]  [address] [place], [place], [address] Phone: [phone]   Fax:  [phone]

## 2015-05-25 ENCOUNTER — Encounter: Payer: Self-pay | Admitting: Occupational Therapy

## 2015-05-25 ENCOUNTER — Ambulatory Visit: Payer: 59 | Admitting: Physical Therapy

## 2015-05-25 ENCOUNTER — Encounter: Payer: 59 | Admitting: Occupational Therapy

## 2015-05-25 ENCOUNTER — Ambulatory Visit: Payer: 59 | Admitting: Occupational Therapy

## 2015-05-25 DIAGNOSIS — G8194 Hemiplegia, unspecified affecting left nondominant side: Secondary | ICD-10-CM

## 2015-05-25 DIAGNOSIS — R2681 Unsteadiness on feet: Secondary | ICD-10-CM

## 2015-05-25 DIAGNOSIS — Z7409 Other reduced mobility: Secondary | ICD-10-CM

## 2015-05-25 DIAGNOSIS — R4189 Other symptoms and signs involving cognitive functions and awareness: Secondary | ICD-10-CM

## 2015-05-25 DIAGNOSIS — R201 Hypoesthesia of skin: Secondary | ICD-10-CM

## 2015-05-25 DIAGNOSIS — G8114 Spastic hemiplegia affecting left nondominant side: Secondary | ICD-10-CM

## 2015-05-25 DIAGNOSIS — G8110 Spastic hemiplegia affecting unspecified side: Secondary | ICD-10-CM | POA: Diagnosis not present

## 2015-05-25 DIAGNOSIS — R269 Unspecified abnormalities of gait and mobility: Secondary | ICD-10-CM

## 2015-05-25 NOTE — Therapy (Signed)
[facility] [address] [place], [place], [address] Phone: [phone]   Fax:  [phone]  Occupational Therapy Treatment  Patient Details  Name: Brian Hall MRN: [id number] Date of Birth: [birth year] Referring Provider:  Claretta Fraise, MD  Encounter Date: 05/25/2015      OT End of Session - 05/25/15 1558    Visit Number 39   Number of Visits 44   Date for OT Re-Evaluation 06/20/15   Authorization Type [employer]   OT Start Time 1401   OT Stop Time 2947   OT Time Calculation (min) 44 min      Past Medical History  Diagnosis Date  . Hypertension   . Diabetes mellitus   . Obesity   . CHF (congestive heart failure)   . SOB (shortness of breath)   . Hyperkalemia   . Renal insufficiency   . Cardiogenic shock   . Cardiac LV ejection fraction 10-20%   . Hyperlipemia   . Hypothyroidism   . Throat cancer 2011    s/p neck dissection, radiation, chemo  . Stroke   . Blurred vision   . Joint pain   . Joint swelling     Past Surgical History  Procedure Laterality Date  . Cardiac catheterization    . Neck dissection Right 2011    s/p resection of throat cancer with multiple nodes removed    There were no vitals filed for this visit.  Visit Diagnosis:  Hemiplegia affecting left nondominant side  Impaired functional mobility and activity tolerance  Impaired sensation  Impaired cognition      Subjective Assessment - 05/25/15 1408    Subjective  My wrist doesnt' hurt at all today   Patient is accompained by: Family member  wife   Pertinent History see epic snapshot   Patient Stated Goals Get back to doing some of the things I used to do.   Currently in Pain? Yes   Pain Score 3    Pain Location Calf   Pain Orientation Right   Pain Descriptors / Indicators Aching;Dull   Pain Type Acute pain   Pain Onset 1 to 4 weeks ago   Pain Frequency Constant   Aggravating Factors  Hitting it against  somehting; walking   Pain Relieving Factors rest, sitting down   Multiple Pain Sites Yes   Pain Score 1   Pain Location Wrist   Pain Orientation Right   Pain Descriptors / Indicators Stabbing   Pain Type Chronic pain   Pain Onset More than a month ago   Pain Frequency Intermittent   Aggravating Factors  catching it, moving it too fast   Pain Relieving Factors meds, rest, support                      OT Treatments/Exercises (OP) - 05/25/15 0001    ADLs   Cooking Pt arrived today without boot on RLE and is now able to wear a shoe therefore addresed STG for cooking. Pt able to complete simple hot meal prep and use cart to transport plate to table with supervision. Pt also able to rinse dishes and load dishwasher with supervision. Pt needed vc's for safety and cues to keep feet apart to improve balance. Pt can still be impulsive at times and therefore needs close supervision to complete any task at an ambulatory level.  Pt required two rest breaks with this 40 minute activity  OT Short Term Goals - 05/25/15 1555    OT SHORT TERM GOAL  #9   Status --  pt is now stepping over ledge and into shower wtih min with wife to sit on seat   OT SHORT TERM GOAL  #11   TITLE Pt and wife will be mod I with upgraded HEP - 05/23/2015 (date adjusted as pt missed 2 weeks of therapy)   Status Achieved   OT SHORT TERM GOAL  #12   TITLE Pt will be supevision for simple hot meal prep   Status Achieved  05/25/2015   OT SHORT TERM GOAL  #13   TITLE Pt will demonstrate ability for bilateral mid reach with light object without facilitation.   Status Achieved           OT Long Term Goals - 05/25/15 1555    OT LONG TERM GOAL #8   Title Pt will demonstrate the ability to use LUE as gross a for basic ADL's 100% of the time - 06/20/2015 (date adjusted as pt missed two weeks of therapy)   Status On-going  able to use for bathing   OT LONG TERM GOAL  #10   TITLE Pt  will be mod I for simple hot meal prep   Status On-going   OT LONG TERM GOAL  #11   TITLE Pt will demonstrate ability for functional tasks at ambulatory level for 10 minutes without needing rest break.   Status Achieved  with min facilitation and min compensations   OT LONG TERM GOAL  #12   TITLE Pt will demonstrate ability to release objects with mod facilitation.    Status On-going   OT LONG TERM GOAL  #13   TITLE Pt will demonstrate ability for unilateral low reach with assist for gross grasp with mn compensations.   Status On-going   OT LONG TERM GOAL  #14   TITLE Pt will be mod I with upgraded HEP prn   Status On-going               Plan - 05/25/15 1556    Clinical Impression Statement Pt has met all STG's and one LTG. Pt continues to make slow steady progress. Pt can still be impulsive and requires min vc's for safety and balance strategies.   Pt will benefit from skilled therapeutic intervention in order to improve on the following deficits (Retired) Decreased balance;Decreased coordination;Decreased cognition;Decreased knowledge of use of DME;Decreased mobility;Decreased range of motion;Decreased safety awareness;Difficulty walking;Decreased strength;Increased edema;Increased muscle spasms;Impaired UE functional use;Impaired tone;Impaired sensation;Impaired vision/preception;Pain   Rehab Potential Good   Clinical Impairments Affecting Rehab Potential Pt will benefit from skilled OT to address deficits outlined in eval to maximize independence.   OT Frequency 2x / week   OT Duration 8 weeks   OT Treatment/Interventions Self-care/ADL training;Cryotherapy;Moist Heat;Electrical Stimulation;Fluidtherapy;DME and/or AE instruction;Energy conservation;Neuromuscular education;Therapeutic exercise;Functional Mobility Training;Manual Therapy;Passive range of motion;Splinting;Therapeutic activities;Balance training;Patient/family education;Cognitive remediation/compensation   Plan NMR,  beginning to work toward LTG's, low unilateral reach with assist wth grasp, grasp and release   OT Home Exercise Plan upgraded HEP to include place and hold   Consulted and Agree with Plan of Care Patient;Family member/caregiver   Family Member Consulted wife        Problem List Patient Active Problem List   Diagnosis Date Noted  . Spastic hemiparesis affecting nondominant side 11/08/2014  . Hypothyroidism 10/27/2014  . Left-sided neglect 09/23/2014  . Acute ischemic right MCA stroke 09/16/2014  . Left hemiparesis   .  CVA (cerebral vascular accident)   . Right carotid artery occlusion   . Acute right MCA stroke   . Essential hypertension 09/12/2014  . CVA (cerebral infarction) 09/12/2014  . Type 2 diabetes mellitus with peripheral neuropathy 09/12/2014    Quay Burow , OTR/L  05/25/2015, 3:59 PM  [facility] [address] [place], [place], [address] Phone: [phone]   Fax:  [phone]

## 2015-05-25 NOTE — Therapy (Signed)
[facility] [address] [place], [place], [address] Phone: [phone]   Fax:  [phone]  Physical Therapy Treatment  Patient Details  Name: Brian Hall MRN: [id number] Date of Birth: 12-21-1955 Referring Provider:  Claretta Fraise, MD  Encounter Date: 05/25/2015      PT End of Session - 05/25/15 2022    Visit Number 37   Number of Visits 49   Date for PT Re-Evaluation 06/04/15   PT Start Time 1320   PT Stop Time 1400   PT Time Calculation (min) 40 min   Activity Tolerance Patient tolerated treatment well   Behavior During Therapy [facility] for tasks assessed/performed      Past Medical History  Diagnosis Date  . Hypertension   . Diabetes mellitus   . Obesity   . CHF (congestive heart failure)   . SOB (shortness of breath)   . Hyperkalemia   . Renal insufficiency   . Cardiogenic shock   . Cardiac LV ejection fraction 10-20%   . Hyperlipemia   . Hypothyroidism   . Throat cancer 2011    s/p neck dissection, radiation, chemo  . Stroke   . Blurred vision   . Joint pain   . Joint swelling     Past Surgical History  Procedure Laterality Date  . Cardiac catheterization    . Neck dissection Right 2011    s/p resection of throat cancer with multiple nodes removed    There were no vitals filed for this visit.  Visit Diagnosis:  Abnormality of gait  Unsteadiness  Impaired sensation  Left spastic hemiplegia      Subjective Assessment - 05/25/15 1327    Subjective Pt saw orthopedist yesterday. Ortho MD cleared patient to stop wearing boot. Recommends light stretching only.   Patient is accompained by: Family member   Pertinent History Light LLE stretching only. CVA with L hemiparesis 09/2014.   Patient Stated Goals Pt's goal is to get back to walking and using L hand again.  He has a motorcycle and would like to ride it again.    Currently in Pain? Yes   Pain Score 3    Pain Location Calf    Pain Orientation Left   Pain Descriptors / Indicators Dull;Aching   Pain Type Acute pain   Pain Onset 1 to 4 weeks ago   Pain Frequency Constant   Aggravating Factors  "hitting it against something; walking"   Pain Relieving Factors rest; sitting down   Effect of Pain on Daily Activities Limited walking tolerance   Multiple Pain Sites No           Postural Assessment Scale for Stroke Patients (PASS)  Give the subject instructions for each item as written below. When scoring the item, record the lowest response category that applies for each item.  Maintaining a Posture  _3_ 1. Sitting Without Support Instructions: Have the subject sit on a bench/mat without back support and with feet flat on the floor. (3) Can sit for 5 minutes without support (2) Can sit for more than 10 seconds without support (1) Can sit with slight support (for example, by 1 hand) (0) Cannot sit  _3_ 2. Standing With Support Instructions: Have the subject stand, providing support as needed. Evaluate only the ability to stand with or without support. Do not consider the quality of the stance. (3) Can stand with support of only 1 hand (2) Can stand with moderate support of 1 person (1) Can  stand with strong support of 2 people (0) Cannot stand, even with support  _3_ 3. Standing Without Support Instructions: Have the subject stand without support. Evaluate only the ability to stand with or without support. Do not consider the quality of the stance. (3) Can stand without support for more than 1 minute and simultaneously perform arm movements at about shoulder level (2) Can stand without support for 1 minute or stands slightly asymmetrically  (1) Can stand without support for 10 seconds or leans heavily on 1 leg (0) Cannot stand without support  _2_ 4. Standing on Nonparetic Leg Instructions: Have the subject stand on the nonparetic leg. Evaluate only the ability to bear weight entirely on the nonparetic  leg. Do not consider how the subject accomplishes the task. (3) Can stand on nonparetic leg for more than 10 seconds (2) Can stand on nonparetic leg for more than 5 seconds (1) Can stand on nonparetic leg for a few seconds (0) Cannot stand on nonparetic leg  _1_ 5. Standing on Paretic Leg Instructions: Have the subject stand on the paretic leg. Evaluate only the ability to bear weight entirely on the paretic leg. Do not consider how the subject accomplishes the task. (3) Can stand on paretic leg for more than 10 seconds (2) Can stand on paretic leg for more than 5 seconds (1) Can stand on paretic leg for a few seconds (0) Cannot stand on paretic leg  Maintaining Posture SUBTOTAL ___12_____  Changing a Posture  _3_ 6. Supine to Paretic Side Lateral Instructions: Begin with the subject in supine on a treatment mat. Instruct the subject to roll to the paretic side (lateral movement). Assist as necessary. Evaluate the subject's performance on the amount of help required. Do not consider the quality of performance. (3) Can perform without help (2) Can perform with little help (1) Can perform with much help (0) Cannot perform  _3_ 7. Supine to Nonparetic Side Lateral Instructions: Begin with the subject in supine on a treatment mat. Instruct the subject to roll to the nonparetic side (lateral movement). Assist as necessary. Evaluate the subject's performance on the amount of help required. Do not consider the quality of performance. (3) Can perform without help (2) Can perform with little help (1) Can perform with much help (0) Cannot perform  _3_ 8. Supine to Sitting Up on the Edge of the Mat Instructions: Begin with the subject in supine on a treatment mat. Instruct the subject to come to sitting on the edge of the mat. Assist as necessary. Evaluate the subject's performance on the amount of help required. Do not consider the quality of performance. (3) Can perform without help (2) Can  perform with little help (1) Can perform with much help (0) Cannot perform  _3_ 9. Sitting on the Edge of the Mat to Supine Instructions: Begin with the on the edge of a treatment mat. Instruct the subject to return to supine. Assist as necessary. Evaluate the subject's performance on the amount of help required. Do not consider the quality of performance. (3) Can perform without help (2) Can perform with little help (1) Can perform with much help (0) Cannot perform  _3_ 10. Sitting to Standing Up Instructions: Begin with the subject sitting on the edge of a treatment mat. Instruct the subject to stand up without support. Assist if necessary. Evaluate the subject's performance on the amount of help required. Do not consider the quality of performance. (3) Can perform without help (2) Can perform with  little help (1) Can perform with much help (0) Cannot perform  _3_ 11. Standing Up to Sitting Down Instructions: Begin with the subject standing by edge of a treatment mat. Instruct the subject to sit on edge of mat without support. Assist if necessary. Evaluate the subject's performance on the amount of help required. Do not consider the quality of performance. (3) Can perform without help (2) Can perform with little help (1) Can perform with much help (0) Cannot perform  _3_ 12. Standing, Picking Up a Pencil from the Floor Instructions: Begin with the subject standing. Instruct the subject to pick up a pencil fro the floor without support. Assist if necessary. Evaluate the subject's performance on the amount of help required. Do not consider the quality of performance. (3) Can perform without help (2) Can perform with little help (1) Can perform with much help (0) Cannot perform  Changing Posture SUBTOTAL __21___   TOTAL _33____                [facility] Adult PT Treatment/Exercise - 05/25/15 0001    Transfers   Transfers Sit to Stand;Stand to Sit;Stand Pivot Transfers    Sit to Stand 6: Modified independent (Device/Increase time)   Stand to Sit 6: Modified independent (Device/Increase time)   Stand Pivot Transfers 6: Modified independent (Device/Increase time)   Ambulation/Gait   Ambulation/Gait Yes   Ambulation/Gait Assistance 5: Supervision;4: Min guard   Ambulation/Gait Assistance Details Verbal/tactile cueing for upright posture; tactile cueing for anterior, lateral weight shift to L   Ambulation Distance (Feet) 345 Feet  2 x115' without AD; x80 with [facility]   Assistive device Small based quad cane  L AFO   Gait Pattern Step-through pattern;Decreased arm swing - left;Decreased stance time - left;Decreased hip/knee flexion - left;Decreased weight shift to left;Left foot flat;Lateral trunk lean to right;Trunk rotated posteriorly on left;Poor foot clearance - left   Curb 4: Min assist   Curb Details (indicate cue type and reason) x2 trials with SBQC. Initial trial with min A; subseqent trial with min guard. At this time, pt exhibits most stability ascending and descending with LLE leading.   Neuro Re-ed    Neuro Re-ed Details  Performed PASS with score of 33/36. See treatment note for detailed findings.                  PT Short Term Goals - 04/11/15 1658    PT SHORT TERM GOAL #1   Title Pt will perform HEP with supervision of family, for improved strength and balance. (Target 01/10/15)   Time 4   Period Weeks   Status Achieved   PT SHORT TERM GOAL #2   Title Pt will improve Timed UP and Go score to less than or equal to 35 seconds for decreased fall risk.   Baseline Discontinued due to Timed up and Go Test reinforcing increased velocity of movement, which increases spasticity and decreases quality of movement.   Time 4   Period Weeks   Status Achieved   PT SHORT TERM GOAL #3   Title Berg Balance score to be assessed, with pt scoring at least 5 additional points to decrease fall risk.   Time 4   Period Weeks   Status Achieved  Scored 33  today   PT SHORT TERM GOAL #4   Title Pt will perform at least 6 of 10 reps of sit<>stand transfers no UE support, with equal weightbearing through bilateral lower extremities, independently.   Time 4  Period Weeks   Status Achieved   PT SHORT TERM GOAL #5   Title Pt will improve TUG time from 29.69 sec to 25 seconds for decreased fall risk. Target date: 03/08/15.   Baseline Discontinued due to Timed up and Go Test reinforcing increased velocity of movement, which increases spasticity and decreases quality of movement.   Time 4   Period Weeks   Status Deferred   PT SHORT TERM GOAL #6   Title Pt will ambulate x200' over level surfaces with [facility] and supervision with no overt LOB. Target date: 03/08/15.   Baseline Achieved 03/07/15.   Time 4   Period Weeks   Status Achieved           PT Long Term Goals - 05/25/15 2023    PT LONG TERM GOAL #1   Title Pt will verbalize understanding of fall prevention within home environment. Target 02/10/15   Time 8   Period Weeks   Status Achieved   PT LONG TERM GOAL #2   Title Pt will improve TUG to less than or equal to 25 seconds for decreased fall risk.   Baseline Goal discontinued, as the nature of TUG promotes increased velocity of movement, which increases spasticity and has negative impact on quality of movement.  7/12: 31.87 sec with SBQC   Time 8   Status Deferred  Discontinued; see above.   PT LONG TERM GOAL #3   Title Pt will improve gait velocity to at least 1 ft/sec for improved gait efficiency and safety.   Time 8   Period Weeks   Status Achieved   PT LONG TERM GOAL #4   Title Pt will ambulate at least 50-100 ft using hemiwalker, household distances, with supervision.   Time 8   Period Weeks   Status Achieved   PT LONG TERM GOAL #5   Title Pt will ambulate 150' over outdoor, unlevel surfaces with LRAD and supervision to indicate increased stability/independence with community mobility. Target date: 04/05/15.   Baseline Met 04/03/15  with pt using SBQC, requiring 25% cueing for safe gait velocity.   Time 8   Period Weeks   Status Achieved   PT LONG TERM GOAL #6   Title Pt will negotiate 4 stairs with single rail and supervision to increase pt independence with community mobility. Target date: 04/05/15.   Baseline Achieved 7/29.   Time 8   Period Weeks   Status Achieved   PT LONG TERM GOAL #7   Title Pt will increase gait speed from 1.02 ft/sec to 1.62 ft/sec to indicate significant improvement in efficiency of ambulation. Modified Target date: 05/03/15   Baseline Continue as STG through renewed POC.   Time 8   Period Weeks   Status On-going  8/3: 1.47 ft/sec with SBQC   PT LONG TERM GOAL #8   Title Pt will improve Berg score from  42 to 48/56 to indicate decreased fall risk and significant improvement in functional standing balance. Target date: 05/31/15   Status On-going   PT LONG TERM GOAL  #9   TITLE Pt will improve score on Postural Assessment Scale for Stroke (PASS) from 30/36 to 33/36 to indicate significantly improved ability to maintain stability during positional change.  Target date: 05/31/15   Baseline Met 9/22.   Status Achieved   PT LONG TERM GOAL  #10   TITLE Pt will negotiate standard curb step and standard ramp with mod I using LRAD to indicate increased safety/independence traversing community obstalces.  Target date: 05/31/15   Status On-going   PT LONG TERM GOAL  #11   TITLE Pt will ambulate 300' over unlevel paved surfaces with mod I using LRAD to indicate increased stability with limited community mobility.  Target date: 05/31/15.   Status On-going               Plan - 05/25/15 2026    Clinical Impression Statement Session focused on assessing current functional status due to 4-week hold in PT due to L gastrocnemius tear. Pt met LTG for PASS score, which improved from 30/36 to 33/36 since last assessed. Continue per POC.   Pt will benefit from skilled therapeutic intervention in order to  improve on the following deficits Abnormal gait;Decreased activity tolerance;Decreased balance;Decreased mobility;Decreased range of motion;Decreased safety awareness;Difficulty walking;Decreased strength;Impaired tone;Decreased knowledge of use of DME;Decreased endurance;Decreased coordination;Impaired perceived functional ability;Postural dysfunction;Impaired sensation;Impaired UE functional use;Impaired vision/preception;Decreased cognition;Pain   Rehab Potential Good   PT Frequency 2x / week   PT Duration 8 weeks   PT Treatment/Interventions ADLs/Self Care Home Management;Therapeutic activities;Functional mobility training;Stair training;Gait training;Therapeutic exercise;Balance training;Neuromuscular re-education;Patient/family education;DME Instruction;Cognitive remediation;Orthotic Fit/Training;Manual techniques;Passive range of motion;Visual/perceptual remediation/compensation   PT Next Visit Plan Read Dr. Alden Server note (upcoming visit). Finish checking LTG's and recert, if appropriate.    Consulted and Agree with Plan of Care Patient;Family member/caregiver   Family Member Consulted wife, [name]        Problem List Patient Active Problem List   Diagnosis Date Noted  . Spastic hemiparesis affecting nondominant side 11/08/2014  . Hypothyroidism 10/27/2014  . Left-sided neglect 09/23/2014  . Acute ischemic right MCA stroke 09/16/2014  . Left hemiparesis   . CVA (cerebral vascular accident)   . Right carotid artery occlusion   . Acute right MCA stroke   . Essential hypertension 09/12/2014  . CVA (cerebral infarction) 09/12/2014  . Type 2 diabetes mellitus with peripheral neuropathy 09/12/2014    Billie Ruddy, PT, DPT [facility] [address] [place], [place], [address] Phone: [phone]   Fax:  [phone] 05/25/2015, 8:30 PM

## 2015-05-26 ENCOUNTER — Encounter: Payer: 59 | Attending: Physical Medicine & Rehabilitation

## 2015-05-26 ENCOUNTER — Encounter: Payer: Self-pay | Admitting: Physical Medicine & Rehabilitation

## 2015-05-26 ENCOUNTER — Ambulatory Visit (HOSPITAL_BASED_OUTPATIENT_CLINIC_OR_DEPARTMENT_OTHER): Payer: 59 | Admitting: Physical Medicine & Rehabilitation

## 2015-05-26 VITALS — BP 120/69 | HR 50 | Resp 14

## 2015-05-26 DIAGNOSIS — I63511 Cerebral infarction due to unspecified occlusion or stenosis of right middle cerebral artery: Secondary | ICD-10-CM | POA: Diagnosis not present

## 2015-05-26 DIAGNOSIS — Z8673 Personal history of transient ischemic attack (TIA), and cerebral infarction without residual deficits: Secondary | ICD-10-CM

## 2015-05-26 DIAGNOSIS — G8110 Spastic hemiplegia affecting unspecified side: Secondary | ICD-10-CM

## 2015-05-26 DIAGNOSIS — R414 Neurologic neglect syndrome: Secondary | ICD-10-CM | POA: Diagnosis not present

## 2015-05-26 HISTORY — DX: Personal history of transient ischemic attack (TIA), and cerebral infarction without residual deficits: Z86.73

## 2015-05-26 NOTE — Patient Instructions (Addendum)
Tibial nerve block next visit  Botox in Nov

## 2015-05-26 NOTE — Progress Notes (Signed)
Subjective:    Patient ID: Brian Hall, male    DOB: [birth year], 59 y.o.   MRN: [id number]  HPI  Botox 04/14/2015 FCR25 FCU25 FDS25 FDP25  Cooked with supervision yesterday in OT Tear of Left calf, Patient states that his physical therapist was stretching his ankle into a dorsiflexed position when he felt a pop in the back of his leg. He was evaluated by orthopedic surgery and was diagnosed with a muscle tear. I asked whether there was an Achilles tendon tear and both he and his wife didn't think so.  The patient continues to work with both PT and OT, outpatient neuro rehabilitation. Ambulating with a quad cane. He is not ambulating independently in the home, recommended to have supervision however patient sometimes will sneak to the refrigerator according to his wife.  No falls reported at home. His left upper extremity is feeling less tight after the Botox. He did not have any finger flexor weakness on this occasion. His dose was reduced from 200 units to 100 units. He does notice some toe curling and foot turning over when he gets up especially without his brace in the left lower extremity overall this is better than before his tibial nerve block  Pain Inventory Average Pain 2 Pain Right Now 0 My pain is intermittent and aching  In the last 24 hours, has pain interfered with the following? General activity 1 Relation with others 1 Enjoyment of life 1 What TIME of day is your pain at its worst? daytime Sleep (in general) Good  Pain is worse with: walking and some activites Pain improves with: rest and medication Relief from Meds: 9  Mobility walk with assistance use a cane how many minutes can you walk? 3-5 ability to climb steps?  yes do you drive?  no use a wheelchair transfers alone Do you have any goals in this area?  yes  Function disabled: date disabled 09/12/14 I need assistance with the following:  dressing, toileting, meal prep, household duties and  shopping Do you have any goals in this area?  yes  Neuro/Psych trouble walking anxiety  Prior Studies Any changes since last visit?  no  Physicians involved in your care Any changes since last visit?  no   Family History  Problem Relation Age of Onset  . Hypertension Mother   . Hypertension Father   . Stroke Father   . Stroke Sister    Social History   Social History  . Marital Status: Legally Separated    Spouse Name: N/A  . Number of Children: 1  . Years of Education: 11   Occupational History  . truck driver, retired    Social History Main Topics  . Smoking status: Never Smoker   . Smokeless tobacco: None  . Alcohol Use: No  . Drug Use: No  . Sexual Activity: Not Asked   Other Topics Concern  . None   Social History Narrative   Married, 1 daughter   Right handed   Caffeine use - rare tea      Past Surgical History  Procedure Laterality Date  . Cardiac catheterization    . Neck dissection Right 2011    s/p resection of throat cancer with multiple nodes removed   Past Medical History  Diagnosis Date  . Hypertension   . Diabetes mellitus   . Obesity   . CHF (congestive heart failure)   . SOB (shortness of breath)   . Hyperkalemia   . Renal insufficiency   .  Cardiogenic shock   . Cardiac LV ejection fraction 10-20%   . Hyperlipemia   . Hypothyroidism   . Throat cancer 2011    s/p neck dissection, radiation, chemo  . Stroke   . Blurred vision   . Joint pain   . Joint swelling    BP 120/69 mmHg  Pulse 50  Resp 14  SpO2 97%  Opioid Risk Score:   Fall Risk Score:  `1  Depression screen PHQ 2/9  Depression screen [facility] 2/9 04/14/2015 04/11/2015 12/06/2014 10/27/2014  Decreased Interest 2 1 2  0  Down, Depressed, Hopeless 0 1 0 0  PHQ - 2 Score 2 2 2  0  Altered sleeping - 0 0 -  Tired, decreased energy - 0 0 -  Change in appetite - 0 0 -  Feeling bad or failure about yourself  - 0 1 -  Trouble concentrating - 0 0 -  Moving slowly or  fidgety/restless - 0 2 -  Suicidal thoughts - 0 0 -  PHQ-9 Score - 2 5 -     Review of Systems  Endocrine:       High blood sugar  Psychiatric/Behavioral: The patient is nervous/anxious.   All other systems reviewed and are negative.      Objective:   Physical Exam  Constitutional: He is oriented to person, place, and time. He appears well-developed and well-nourished.  HENT:  Head: Normocephalic and atraumatic.  Eyes: Conjunctivae and EOM are normal. Pupils are equal, round, and reactive to light.  Neck: Normal range of motion.  Neurological: He is alert and oriented to person, place, and time.  Motor strength is 0/5 left finger extensors and wrist extensors, 2 minus at the biceps and triceps, grip is influenced by flexor tone Ashworth scale 2-3 at the finger flexors and 2 at the wrist flexors. Left lower extremity has 4 minus at the hip flexors and knee extensors 2 minus ankle dorsiflexors and plantar flexors  Ashworth score of 3 at the plantar flexors and foot inverters on the left side  5/5 strength in the right upper deltoid, biceps, triceps, grip, hip flexor, knee extensor, ankle dorsiflexor and plantar flexor  Psychiatric: He has a normal mood and affect.  Nursing note and vitals reviewed.         Assessment & Plan:  1. Right MCA infarct with left hemiparesis. Patient is satisfied with his spasticity reduction even after reducing Botox from 200 units to 100 units. He had some excessive weakness for a few weeks after the previous dose of 200 units. He certainly doesn't have this, his spasticity is still elevated at Ashworth grade 2-3.  Left lower extremity starting to get some clawing of the toes inverting of the ankle. Had a left calf muscle tear which was during range of motion for tightness at the plantar flexors. Will repeat tibial nerve block with phenol. Depending on result may consider Botox of the left lower extremity instead  Discussed with patient and his  wife agree with plan  Over half of the 25 min visit was spent counseling and coordinating care.

## 2015-05-30 ENCOUNTER — Ambulatory Visit: Payer: 59 | Admitting: Occupational Therapy

## 2015-05-30 ENCOUNTER — Encounter: Payer: Self-pay | Admitting: Occupational Therapy

## 2015-05-30 ENCOUNTER — Ambulatory Visit: Payer: 59 | Admitting: Physical Therapy

## 2015-05-30 DIAGNOSIS — R4189 Other symptoms and signs involving cognitive functions and awareness: Secondary | ICD-10-CM

## 2015-05-30 DIAGNOSIS — Z7409 Other reduced mobility: Secondary | ICD-10-CM

## 2015-05-30 DIAGNOSIS — G8194 Hemiplegia, unspecified affecting left nondominant side: Secondary | ICD-10-CM

## 2015-05-30 DIAGNOSIS — R269 Unspecified abnormalities of gait and mobility: Secondary | ICD-10-CM

## 2015-05-30 DIAGNOSIS — G8110 Spastic hemiplegia affecting unspecified side: Secondary | ICD-10-CM | POA: Diagnosis not present

## 2015-05-30 DIAGNOSIS — G8114 Spastic hemiplegia affecting left nondominant side: Secondary | ICD-10-CM

## 2015-05-30 DIAGNOSIS — R2681 Unsteadiness on feet: Secondary | ICD-10-CM

## 2015-05-30 DIAGNOSIS — R201 Hypoesthesia of skin: Secondary | ICD-10-CM

## 2015-05-30 NOTE — Therapy (Addendum)
[facility] [address] [place], [place], [address] Phone: [phone]   Fax:  [phone]  Physical Therapy Treatment  Patient Details  Name: Brian Hall MRN: [id number] Date of Birth: [birth year] Referring Joani Cosma:  Claretta Fraise, MD  Encounter Date: 05/30/2015      PT End of Session - 05/30/15 1934    Visit Number 38   Number of Visits 49   Date for PT Re-Evaluation 06/04/15   PT Start Time 1320   PT Stop Time 1400   PT Time Calculation (min) 40 min   Activity Tolerance Patient tolerated treatment well   Behavior During Therapy [facility] for tasks assessed/performed      Past Medical History  Diagnosis Date  . Hypertension   . Diabetes mellitus   . Obesity   . CHF (congestive heart failure)   . SOB (shortness of breath)   . Hyperkalemia   . Renal insufficiency   . Cardiogenic shock   . Cardiac LV ejection fraction 10-20%   . Hyperlipemia   . Hypothyroidism   . Throat cancer 2011    s/p neck dissection, radiation, chemo  . Stroke   . Blurred vision   . Joint pain   . Joint swelling     Past Surgical History  Procedure Laterality Date  . Cardiac catheterization    . Neck dissection Right 2011    s/p resection of throat cancer with multiple nodes removed    There were no vitals filed for this visit.  Visit Diagnosis:  Abnormality of gait  Unsteadiness  Left spastic hemiplegia      Subjective Assessment - 05/30/15 1336    Subjective Pt/spouse reporting that Dr. Letta Pate is planning on giving pt another L tibial nerve block with phenol in 3 weeks.   Patient is accompained by: Family member   Pertinent History Light LLE stretching only. CVA with L hemiparesis 09/2014.   Limitations Standing;Walking;House hold activities   Patient Stated Goals Pt's goal is to get back to walking and using L hand again.  He has a motorcycle and would like to ride it again.    Currently in Pain? Yes   Pain  Score 4    Pain Location Calf   Pain Orientation Left   Pain Descriptors / Indicators Aching;Dull   Pain Type Acute pain   Pain Onset 1 to 4 weeks ago   Pain Frequency Constant   Aggravating Factors  walking   Pain Relieving Factors resting; sitting down   Effect of Pain on Daily Activities limits walking tolerance   Multiple Pain Sites No                         [facility] Adult PT Treatment/Exercise - 05/30/15 0001    Transfers   Transfers Sit to Stand;Stand to Sit;Stand Pivot Transfers   Sit to Stand 6: Modified independent (Device/Increase time)   Stand to Sit 6: Modified independent (Device/Increase time)   Stand Pivot Transfers 6: Modified independent (Device/Increase time)   Ambulation/Gait   Ambulation/Gait Yes   Ambulation/Gait Assistance 5: Supervision   Ambulation/Gait Assistance Details Cueing focused on upright posture, L pelvic protraction,anterior weight shift, lateral weight shift to L.   Ambulation Distance (Feet) 250 Feet   Assistive device Small based quad cane  L AFO   Gait Pattern Step-through pattern;Decreased arm swing - left;Decreased stance time - left;Decreased hip/knee flexion - left;Decreased weight shift to left;Left foot flat;Lateral trunk lean  to right;Trunk rotated posteriorly on left;Poor foot clearance - left   Ambulation Surface Level;Unlevel;Indoor;Outdoor;Paved   Door Management 4: Min assist   [employer] Test   Sit to Stand Able to stand without using hands and stabilize independently   Standing Unsupported Able to stand safely 2 minutes   Sitting with Back Unsupported but Feet Supported on Floor or Stool Able to sit safely and securely 2 minutes   Stand to Sit Controls descent by using hands   Transfers Needs one person to assist   Standing Unsupported with Eyes Closed Able to stand 10 seconds safely   Standing Ubsupported with Feet Together Able to place feet together independently and stand 1 minute safely   From Standing,  Reach Forward with Outstretched Arm Can reach confidently >25 cm (10")   From Standing Position, Pick up Object from Floor Able to pick up shoe, needs supervision   From Standing Position, Turn to Look Behind Over each Shoulder Looks behind one side only/other side shows less weight shift   Turn 360 Degrees Needs close supervision or verbal cueing   Standing Unsupported, Alternately Place Feet on Step/Stool Able to complete >2 steps/needs minimal assist   Standing Unsupported, One Foot in Front Able to plae foot ahead of the other independently and hold 30 seconds   Standing on One Leg Able to lift leg independently and hold 5-10 seconds   Total Score 42   Neuro Re-ed    Neuro Re-ed Details  Completed Berg with score of 42/56; see outcome measure for detailed findings.                PT Education - 05/30/15 1939    Education provided Yes   Education Details Merrilee Jansky findings, functional implications, fall risk. Recommendation to hold PT until after tibial nerve block on 10/17.   Person(s) Educated Patient;Spouse   Methods Explanation   Comprehension Verbalized understanding          PT Short Term Goals - 04/11/15 1658    PT SHORT TERM GOAL #1   Title Pt will perform HEP with supervision of family, for improved strength and balance. (Target 01/10/15)   Time 4   Period Weeks   Status Achieved   PT SHORT TERM GOAL #2   Title Pt will improve Timed UP and Go score to less than or equal to 35 seconds for decreased fall risk.   Baseline Discontinued due to Timed up and Go Test reinforcing increased velocity of movement, which increases spasticity and decreases quality of movement.   Time 4   Period Weeks   Status Achieved   PT SHORT TERM GOAL #3   Title Berg Balance score to be assessed, with pt scoring at least 5 additional points to decrease fall risk.   Time 4   Period Weeks   Status Achieved  Scored 33 today   PT SHORT TERM GOAL #4   Title Pt will perform at least 6 of 10  reps of sit<>stand transfers no UE support, with equal weightbearing through bilateral lower extremities, independently.   Time 4   Period Weeks   Status Achieved   PT SHORT TERM GOAL #5   Title Pt will improve TUG time from 29.69 sec to 25 seconds for decreased fall risk. Target date: 03/08/15.   Baseline Discontinued due to Timed up and Go Test reinforcing increased velocity of movement, which increases spasticity and decreases quality of movement.   Time 4   Period Weeks  Status Deferred   PT SHORT TERM GOAL #6   Title Pt will ambulate x200' over level surfaces with [facility] and supervision with no overt LOB. Target date: 03/08/15.   Baseline Achieved 03/07/15.   Time 4   Period Weeks   Status Achieved           PT Long Term Goals - 05/30/15 1933    PT LONG TERM GOAL #1   Title Pt will verbalize understanding of fall prevention within home environment. Target 02/10/15   Time 8   Period Weeks   Status Achieved   PT LONG TERM GOAL #2   Title Pt will improve TUG to less than or equal to 25 seconds for decreased fall risk.   Baseline Goal discontinued, as the nature of TUG promotes increased velocity of movement, which increases spasticity and has negative impact on quality of movement.  7/12: 31.87 sec with SBQC   Time 8   Status Deferred  Discontinued; see above.   PT LONG TERM GOAL #3   Title Pt will improve gait velocity to at least 1 ft/sec for improved gait efficiency and safety.   Time 8   Period Weeks   Status Achieved   PT LONG TERM GOAL #4   Title Pt will ambulate at least 50-100 ft using hemiwalker, household distances, with supervision.   Time 8   Period Weeks   Status Achieved   PT LONG TERM GOAL #5   Title Pt will ambulate 150' over outdoor, unlevel surfaces with LRAD and supervision to indicate increased stability/independence with community mobility. Target date: 04/05/15.   Baseline Met 04/03/15 with pt using SBQC, requiring 25% cueing for safe gait velocity.   Time  8   Period Weeks   Status Achieved   PT LONG TERM GOAL #6   Title Pt will negotiate 4 stairs with single rail and supervision to increase pt independence with community mobility. Target date: 04/05/15.   Baseline Achieved 7/29.   Time 8   Period Weeks   Status Achieved   PT LONG TERM GOAL #7   Title Pt will increase gait speed from 1.02 ft/sec to 1.62 ft/sec to indicate significant improvement in efficiency of ambulation. Modified Target date: 05/03/15   Baseline Continue as STG through renewed POC.   Time 8   Period Weeks   Status On-going  8/3: 1.47 ft/sec with SBQC   PT LONG TERM GOAL #8   Title Pt will improve Berg score from  42 to 48/56 to indicate decreased fall risk and significant improvement in functional standing balance. Target date: 05/31/15   Status On-going   PT LONG TERM GOAL  #9   TITLE Pt will improve score on Postural Assessment Scale for Stroke (PASS) from 30/36 to 33/36 to indicate significantly improved ability to maintain stability during positional change.  Target date: 05/31/15   Baseline Met 9/22.   Status Achieved   PT LONG TERM GOAL  #10   TITLE Pt will negotiate standard curb step and standard ramp with mod I using LRAD to indicate increased safety/independence traversing community obstalces.  Target date: 05/31/15   Status On-going   PT LONG TERM GOAL  #11   TITLE Pt will ambulate 300' over unlevel paved surfaces with mod I using LRAD to indicate increased stability with limited community mobility.  Target date: 05/31/15.   Status On-going               Plan - 05/30/15 1941  Clinical Impression Statement Addendum: Merrilee Jansky Balance Scale score unchanged from 42/56. Discussed impact of LE spasticity of progress in therapy. Will further discuss at next session. Continue per POC.   Pt will benefit from skilled therapeutic intervention in order to improve on the following deficits Abnormal gait;Decreased activity tolerance;Decreased balance;Decreased  mobility;Decreased range of motion;Decreased safety awareness;Difficulty walking;Decreased strength;Impaired tone;Decreased knowledge of use of DME;Decreased endurance;Decreased coordination;Impaired perceived functional ability;Postural dysfunction;Impaired sensation;Impaired UE functional use;Impaired vision/preception;Decreased cognition;Pain   Rehab Potential Good   PT Frequency 2x / week   PT Duration 8 weeks   PT Treatment/Interventions ADLs/Self Care Home Management;Therapeutic activities;Functional mobility training;Stair training;Gait training;Therapeutic exercise;Balance training;Neuromuscular re-education;Patient/family education;DME Instruction;Cognitive remediation;Orthotic Fit/Training;Manual techniques;Passive range of motion;Visual/perceptual remediation/compensation   PT Next Visit Plan    Consulted and Agree with Plan of Care Patient;Family member/caregiver   Family Member Consulted wife, [name]        Problem List Patient Active Problem List   Diagnosis Date Noted  . 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 Spastic hemiparesis affecting nondominant side 11/08/2014  . Hypothyroidism 10/27/2014  . Left-sided neglect 09/23/2014  . Acute ischemic right MCA stroke 09/16/2014  . Left hemiparesis   . CVA (cerebral vascular accident)   . Right carotid artery occlusion   . Acute right MCA stroke   . Essential hypertension 09/12/2014  . CVA (cerebral infarction) 09/12/2014  . Type 2 diabetes mellitus with peripheral neuropathy 09/12/2014   Billie Ruddy, PT, DPT [facility] [address] [place], [place], [address] Phone: [phone]   Fax:  [phone] 05/30/2015, 7:47 PM

## 2015-05-30 NOTE — Therapy (Signed)
[facility] [address] [place], [place], [address] Phone: [phone]   Fax:  [phone]  Occupational Therapy Treatment  Patient Details  Name: Brian PARKERSON MRN: [id number] Date of Birth: Apr 03, 1956 Referring Provider:  Claretta Fraise, MD  Encounter Date: 05/30/2015      OT End of Session - 05/30/15 1721    Visit Number 40   Number of Visits 80   Date for OT Re-Evaluation 06/20/15   Authorization Type [employer]   OT Start Time 1402   OT Stop Time 1445   OT Time Calculation (min) 43 min   Activity Tolerance Patient tolerated treatment well      Past Medical History  Diagnosis Date  . Hypertension   . Diabetes mellitus   . Obesity   . CHF (congestive heart failure)   . SOB (shortness of breath)   . Hyperkalemia   . Renal insufficiency   . Cardiogenic shock   . Cardiac LV ejection fraction 10-20%   . Hyperlipemia   . Hypothyroidism   . Throat cancer 2011    s/p neck dissection, radiation, chemo  . Stroke   . Blurred vision   . Joint pain   . Joint swelling     Past Surgical History  Procedure Laterality Date  . Cardiac catheterization    . Neck dissection Right 2011    s/p resection of throat cancer with multiple nodes removed    There were no vitals filed for this visit.  Visit Diagnosis:  Hemiplegia affecting left nondominant side  Impaired functional mobility and activity tolerance  Impaired sensation  Impaired cognition      Subjective Assessment - 05/30/15 1409    Subjective  It is hard to reach and not bend my elbow   Patient is accompained by: Family member  wife   Pertinent History see epic snapshot   Patient Stated Goals Get back to doing some of the things I used to do.   Currently in Pain? Yes   Pain Score 4    Pain Location Calf   Pain Orientation Left   Pain Descriptors / Indicators Aching;Dull   Pain Type Acute pain   Pain Onset 1 to 4 weeks ago   Pain Frequency  Constant   Aggravating Factors  walking   Pain Relieving Factors resting, sitting down   Effect of Pain on Daily Activities limits walking tolerance                      OT Treatments/Exercises (OP) - 05/30/15 0001    Neurological Re-education Exercises   Other Exercises 2 Neuro re ed to address unilateral reach in weight bearing with LUE using UE ranger. with emphasis on decreasing flexor synergy influence, slow movements, grading, timing, sequencing and direction of movement. Also focused on moving arm on body. Pt able to control reach to approximately 80* with intermittent light facilitation; 80-90* with min facilitation. Pt continues to need mod cues and repetition due to sensory loss, apraxia, and cogntive deficits.                   OT Short Term Goals - 05/30/15 1719    OT SHORT TERM GOAL  #9   Status --  pt is now stepping over ledge and into shower wtih min with wife to sit on seat   OT SHORT TERM GOAL  #11   TITLE Pt and wife will be mod I with upgraded HEP -  05/23/2015 (date adjusted as pt missed 2 weeks of therapy)   Status Achieved   OT SHORT TERM GOAL  #12   TITLE Pt will be supevision for simple hot meal prep   Status Achieved  05/25/2015   OT SHORT TERM GOAL  #13   TITLE Pt will demonstrate ability for bilateral mid reach with light object without facilitation.   Status Achieved           OT Long Term Goals - 05/30/15 1719    OT LONG TERM GOAL #8   Title Pt will demonstrate the ability to use LUE as gross a for basic ADL's 100% of the time - 06/20/2015 (date adjusted as pt missed two weeks of therapy)   Status On-going  able to use for bathing   OT LONG TERM GOAL  #10   TITLE Pt will be mod I for simple hot meal prep   Status On-going   OT LONG TERM GOAL  #11   TITLE Pt will demonstrate ability for functional tasks at ambulatory level for 10 minutes without needing rest break.   Status Achieved  with min facilitation and min  compensations   OT LONG TERM GOAL  #12   TITLE Pt will demonstrate ability to release objects with mod facilitation.    Status On-going   OT LONG TERM GOAL  #13   TITLE Pt will demonstrate ability for unilateral low reach with assist for gross grasp with mn compensations.   Status On-going   OT LONG TERM GOAL  #14   TITLE Pt will be mod I with upgraded HEP prn   Status On-going               Plan - 05/30/15 1720    Clinical Impression Statement Pt continues to make slow progress toward goals. Pt needs significant repetition with all activities due to sensory impairment, apraxia, and cogntive deficits.    Pt will benefit from skilled therapeutic intervention in order to improve on the following deficits (Retired) Decreased balance;Decreased coordination;Decreased cognition;Decreased knowledge of use of DME;Decreased mobility;Decreased range of motion;Decreased safety awareness;Difficulty walking;Decreased strength;Increased edema;Increased muscle spasms;Impaired UE functional use;Impaired tone;Impaired sensation;Impaired vision/preception;Pain   Rehab Potential Good   Clinical Impairments Affecting Rehab Potential Pt will benefit from skilled OT to address deficits outlined in eval to maximize independence.   OT Frequency 2x / week   OT Duration 8 weeks   OT Treatment/Interventions Self-care/ADL training;Cryotherapy;Moist Heat;Electrical Stimulation;Fluidtherapy;DME and/or AE instruction;Energy conservation;Neuromuscular education;Therapeutic exercise;Functional Mobility Training;Manual Therapy;Passive range of motion;Splinting;Therapeutic activities;Balance training;Patient/family education;Cognitive remediation/compensation   Plan NMR, work toward low unilateral reach with assisted grasp, grasp and release.   OT Home Exercise Plan upgraded HEP to include place and hold   Consulted and Agree with Plan of Care Patient;Family member/caregiver   Family Member Consulted wife         Problem List Patient Active Problem List   Diagnosis Date Noted  . 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 Spastic hemiparesis affecting nondominant side 11/08/2014  . Hypothyroidism 10/27/2014  . Left-sided neglect 09/23/2014  . Acute ischemic right MCA stroke 09/16/2014  . Left hemiparesis   . CVA (cerebral vascular accident)   . Right carotid artery occlusion   . Acute right MCA stroke   . Essential hypertension 09/12/2014  . CVA (cerebral infarction) 09/12/2014  . Type 2 diabetes mellitus with peripheral neuropathy 09/12/2014    Quay Burow, OTR/L 05/30/2015, 5:23 PM  [facility] [phone] [place], [place], [address] Phone: [phone]   Fax:  [phone]    

## 2015-06-01 ENCOUNTER — Ambulatory Visit: Payer: 59 | Admitting: Physical Therapy

## 2015-06-01 ENCOUNTER — Encounter: Payer: Self-pay | Admitting: Occupational Therapy

## 2015-06-01 ENCOUNTER — Ambulatory Visit: Payer: 59 | Admitting: Occupational Therapy

## 2015-06-01 ENCOUNTER — Encounter: Payer: 59 | Admitting: Occupational Therapy

## 2015-06-01 DIAGNOSIS — G8114 Spastic hemiplegia affecting left nondominant side: Secondary | ICD-10-CM

## 2015-06-01 DIAGNOSIS — Z7409 Other reduced mobility: Secondary | ICD-10-CM

## 2015-06-01 DIAGNOSIS — G8110 Spastic hemiplegia affecting unspecified side: Secondary | ICD-10-CM | POA: Diagnosis not present

## 2015-06-01 DIAGNOSIS — R414 Neurologic neglect syndrome: Secondary | ICD-10-CM

## 2015-06-01 DIAGNOSIS — R4189 Other symptoms and signs involving cognitive functions and awareness: Secondary | ICD-10-CM

## 2015-06-01 DIAGNOSIS — R269 Unspecified abnormalities of gait and mobility: Secondary | ICD-10-CM

## 2015-06-01 DIAGNOSIS — R201 Hypoesthesia of skin: Secondary | ICD-10-CM

## 2015-06-01 NOTE — Therapy (Signed)
[facility] [address] [place], [place], [address] Phone: [phone]   Fax:  [phone]  Physical Therapy Treatment  Patient Details  Name: Brian Hall MRN: [id number] Date of Birth: 08/27/1956 Referring Raeanna Soberanes:  Claretta Fraise, MD  Encounter Date: 06/01/2015      PT End of Session - 06/01/15 1922    Visit Number 39   Number of Visits 49   Date for PT Re-Evaluation 06/04/15   PT Start Time 3329   PT Stop Time 1445   PT Time Calculation (min) 42 min   Activity Tolerance Patient tolerated treatment well   Behavior During Therapy [facility] for tasks assessed/performed      Past Medical History  Diagnosis Date  . Hypertension   . Diabetes mellitus   . Obesity   . CHF (congestive heart failure)   . SOB (shortness of breath)   . Hyperkalemia   . Renal insufficiency   . Cardiogenic shock   . Cardiac LV ejection fraction 10-20%   . Hyperlipemia   . Hypothyroidism   . Throat cancer 2011    s/p neck dissection, radiation, chemo  . Stroke   . Blurred vision   . Joint pain   . Joint swelling     Past Surgical History  Procedure Laterality Date  . Cardiac catheterization    . Neck dissection Right 2011    s/p resection of throat cancer with multiple nodes removed    There were no vitals filed for this visit.  Visit Diagnosis:  Abnormality of gait  Left spastic hemiplegia      Subjective Assessment - 06/01/15 1911    Subjective No significant changes since last session. Pt initially reluctant to trial L PFRW but amenable with education, coaxing.   Patient is accompained by: Family member   Pertinent History Light LLE stretching only. CVA with L hemiparesis 09/2014.   Limitations Standing;Walking;House hold activities   Patient Stated Goals Pt's goal is to get back to walking and using L hand again.  He has a motorcycle and would like to ride it again.    Currently in Pain? Yes   Pain Score 3    Pain Location Calf   Pain Orientation Left   Pain Descriptors / Indicators Aching;Dull   Pain Type Acute pain   Pain Onset 1 to 4 weeks ago   Pain Frequency Constant   Aggravating Factors  walking   Pain Relieving Factors resting and sitting down   Effect of Pain on Daily Activities limits walking tolerance   Multiple Pain Sites Yes   Pain Score 1   Pain Location Wrist   Pain Orientation Left   Pain Descriptors / Indicators Sore   Pain Type Chronic pain   Pain Onset More than a month ago   Pain Frequency Intermittent   Aggravating Factors  catching it, moving it too fast   Pain Relieving Factors meds, rest, support                         [facility] Adult PT Treatment/Exercise - 06/01/15 0001    Transfers   Transfers Sit to Stand;Stand to Sit;Stand Pivot Transfers   Sit to Stand 6: Modified independent (Device/Increase time)   Stand to Sit 6: Modified independent (Device/Increase time)   Stand Pivot Transfers 6: Modified independent (Device/Increase time)   Ambulation/Gait   Ambulation/Gait Yes   Ambulation/Gait Assistance 5: Supervision   Ambulation Distance (Feet) 600 Feet  200' x3 trials   Assistive device Left platform walker  L AFO   Gait Pattern Step-through pattern;Decreased stance time - left;Decreased hip/knee flexion - left;Decreased weight shift to left;Left foot flat;Trunk rotated posteriorly on left;Left circumduction   Ambulation Surface Level;Indoor   Gait Comments Gait training with L PFRW to increase proprioceptive input, promote symmetrical postural alignment, gait pattern. Cueing addressed posterior rotation of trunk on L, L stance stability, and for increased L hip/knee flexion during LLE advancement (as opposed to circumduction).    Neuro Re-ed    Neuro Re-ed Details  Explained and demonstrated the following LLE stretches (x3 minutes each) for spasticity management, more normalized gait pattern: supine bent knee fallouts for L hip adductors,  supine caregiver-assisted hip adductor stretch with knee extension; seated L hamstrings self-stretch.                PT Education - 06/01/15 1914    Education provided Yes   Education Details Plan to hold PT until after tibial nerve block on 10/17. Stretching program (excluding gastroc) for spasticity management.   Person(s) Educated Patient;Spouse   Methods Explanation;Demonstration;Tactile cues;Verbal cues   Comprehension Verbalized understanding;Returned demonstration          PT Short Term Goals - 04/11/15 1658    PT SHORT TERM GOAL #1   Title Pt will perform HEP with supervision of family, for improved strength and balance. (Target 01/10/15)   Time 4   Period Weeks   Status Achieved   PT SHORT TERM GOAL #2   Title Pt will improve Timed UP and Go score to less than or equal to 35 seconds for decreased fall risk.   Baseline Discontinued due to Timed up and Go Test reinforcing increased velocity of movement, which increases spasticity and decreases quality of movement.   Time 4   Period Weeks   Status Achieved   PT SHORT TERM GOAL #3   Title Berg Balance score to be assessed, with pt scoring at least 5 additional points to decrease fall risk.   Time 4   Period Weeks   Status Achieved  Scored 33 today   PT SHORT TERM GOAL #4   Title Pt will perform at least 6 of 10 reps of sit<>stand transfers no UE support, with equal weightbearing through bilateral lower extremities, independently.   Time 4   Period Weeks   Status Achieved   PT SHORT TERM GOAL #5   Title Pt will improve TUG time from 29.69 sec to 25 seconds for decreased fall risk. Target date: 03/08/15.   Baseline Discontinued due to Timed up and Go Test reinforcing increased velocity of movement, which increases spasticity and decreases quality of movement.   Time 4   Period Weeks   Status Deferred   PT SHORT TERM GOAL #6   Title Pt will ambulate x200' over level surfaces with [facility] and supervision with no overt  LOB. Target date: 03/08/15.   Baseline Achieved 03/07/15.   Time 4   Period Weeks   Status Achieved           PT Long Term Goals - 05/30/15 1933    PT LONG TERM GOAL #1   Title Pt will verbalize understanding of fall prevention within home environment. Target 02/10/15   Time 8   Period Weeks   Status Achieved   PT LONG TERM GOAL #2   Title Pt will improve TUG to less than or equal to 25 seconds for decreased fall risk.  Baseline Goal discontinued, as the nature of TUG promotes increased velocity of movement, which increases spasticity and has negative impact on quality of movement.  7/12: 31.87 sec with SBQC   Time 8   Status Deferred  Discontinued; see above.   PT LONG TERM GOAL #3   Title Pt will improve gait velocity to at least 1 ft/sec for improved gait efficiency and safety.   Time 8   Period Weeks   Status Achieved   PT LONG TERM GOAL #4   Title Pt will ambulate at least 50-100 ft using hemiwalker, household distances, with supervision.   Time 8   Period Weeks   Status Achieved   PT LONG TERM GOAL #5   Title Pt will ambulate 150' over outdoor, unlevel surfaces with LRAD and supervision to indicate increased stability/independence with community mobility. Target date: 04/05/15.   Baseline Met 04/03/15 with pt using SBQC, requiring 25% cueing for safe gait velocity.   Time 8   Period Weeks   Status Achieved   PT LONG TERM GOAL #6   Title Pt will negotiate 4 stairs with single rail and supervision to increase pt independence with community mobility. Target date: 04/05/15.   Baseline Achieved 7/29.   Time 8   Period Weeks   Status Achieved   PT LONG TERM GOAL #7   Title Pt will increase gait speed from 1.02 ft/sec to 1.62 ft/sec to indicate significant improvement in efficiency of ambulation. Modified Target date: 05/03/15   Baseline Continue as STG through renewed POC.   Time 8   Period Weeks   Status On-going  8/3: 1.47 ft/sec with SBQC   PT LONG TERM GOAL #8   Title  Pt will improve Berg score from  42 to 48/56 to indicate decreased fall risk and significant improvement in functional standing balance. Target date: 05/31/15   Status On-going   PT LONG TERM GOAL  #9   TITLE Pt will improve score on Postural Assessment Scale for Stroke (PASS) from 30/36 to 33/36 to indicate significantly improved ability to maintain stability during positional change.  Target date: 05/31/15   Baseline Met 9/22.   Status Achieved   PT LONG TERM GOAL  #10   TITLE Pt will negotiate standard curb step and standard ramp with mod I using LRAD to indicate increased safety/independence traversing community obstalces.  Target date: 05/31/15   Status On-going   PT LONG TERM GOAL  #11   TITLE Pt will ambulate 300' over unlevel paved surfaces with mod I using LRAD to indicate increased stability with limited community mobility.  Target date: 05/31/15.   Status On-going               Plan - 06/01/15 1924    Clinical Impression Statement Session focused on gait training with L PFRW to increased proprioceptive input, promote more symmetrical postural alignment, gait pattern. Provided HEP for LLE spasticity management (exclusing L gastrocnemius due to ongoing pain and MD orders for light stretching only). Placing pt on hold from PT until pt receives L tibial nerve block on 10/17. Pt/spouse verbalized understanding and were in full agreement.   Pt will benefit from skilled therapeutic intervention in order to improve on the following deficits Abnormal gait;Decreased activity tolerance;Decreased balance;Decreased mobility;Decreased range of motion;Decreased safety awareness;Difficulty walking;Decreased strength;Impaired tone;Decreased knowledge of use of DME;Decreased endurance;Decreased coordination;Impaired perceived functional ability;Postural dysfunction;Impaired sensation;Impaired UE functional use;Impaired vision/preception;Decreased cognition;Pain   Rehab Potential Good   PT Frequency 2x  / week  PT Duration 8 weeks   PT Treatment/Interventions ADLs/Self Care Home Management;Therapeutic activities;Functional mobility training;Stair training;Gait training;Therapeutic exercise;Balance training;Neuromuscular re-education;Patient/family education;DME Instruction;Cognitive remediation;Orthotic Fit/Training;Manual techniques;Passive range of motion;Visual/perceptual remediation/compensation   PT Next Visit Plan Resume after L tibial nerve block.   Consulted and Agree with Plan of Care Patient;Family member/caregiver   Family Member Consulted wife, [name]        Problem List Patient Active Problem List   Diagnosis Date Noted  . 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 Spastic hemiparesis affecting nondominant side 11/08/2014  . Hypothyroidism 10/27/2014  . Left-sided neglect 09/23/2014  . Acute ischemic right MCA stroke 09/16/2014  . Left hemiparesis   . CVA (cerebral vascular accident)   . Right carotid artery occlusion   . Acute right MCA stroke   . Essential hypertension 09/12/2014  . CVA (cerebral infarction) 09/12/2014  . Type 2 diabetes mellitus with peripheral neuropathy 09/12/2014    Billie Ruddy, PT, DPT [facility] [address] [place], [place], [address] Phone: [phone]   Fax:  [phone] 06/01/2015, 7:31 PM

## 2015-06-01 NOTE — Therapy (Signed)
[facility] [address] [place], [place], [address] Phone: [phone]   Fax:  [phone]  Occupational Therapy Treatment  Patient Details  Name: Brian Hall MRN: [id number] Date of Birth: [birth year] Referring Provider:  Claretta Fraise, MD  Encounter Date: 06/01/2015      OT End of Session - 06/01/15 1730    Visit Number 41   Number of Visits 50   Date for OT Re-Evaluation 06/20/15   Authorization Type [employer]   OT Start Time 1448   OT Stop Time 1530   OT Time Calculation (min) 42 min   Activity Tolerance Patient tolerated treatment well      Past Medical History  Diagnosis Date  . Hypertension   . Diabetes mellitus   . Obesity   . CHF (congestive heart failure)   . SOB (shortness of breath)   . Hyperkalemia   . Renal insufficiency   . Cardiogenic shock   . Cardiac LV ejection fraction 10-20%   . Hyperlipemia   . Hypothyroidism   . Throat cancer 2011    s/p neck dissection, radiation, chemo  . Stroke   . Blurred vision   . Joint pain   . Joint swelling     Past Surgical History  Procedure Laterality Date  . Cardiac catheterization    . Neck dissection Right 2011    s/p resection of throat cancer with multiple nodes removed    There were no vitals filed for this visit.  Visit Diagnosis:  Left spastic hemiplegia  Impaired functional mobility and activity tolerance  Impaired sensation  Impaired cognition  Left-sided neglect      Subjective Assessment - 06/01/15 1454    Subjective  When I use that walker it makes me use my hand and my wrist feels better   Patient is accompained by: Family member  wife   Pertinent History see epic snapshot   Patient Stated Goals Get back to doing some of the things I used to do.   Currently in Pain? Yes   Pain Score 3    Pain Location Calf   Pain Orientation Left   Pain Descriptors / Indicators Aching;Dull   Pain Type Acute pain   Pain Onset  1 to 4 weeks ago   Pain Frequency Constant   Aggravating Factors  walking   Pain Relieving Factors resting and sitting down   Multiple Pain Sites Yes   Pain Score 1   Pain Location Wrist   Pain Orientation Left   Pain Descriptors / Indicators Sore   Pain Type Chronic pain   Pain Onset More than a month ago   Pain Frequency Intermittent   Aggravating Factors  catching it, moving it too fast   Pain Relieving Factors meds, rest, support                      OT Treatments/Exercises (OP) - 06/01/15 0001    Neurological Re-education Exercises   Other Exercises 2 Neuro re ed to address alignment, increasing weight bearing on LLE, increasing fuctional use of LUE with walker , decreasing tone. Pt much more symetrical with rolling walker and noticeable decrease in tone in RUE as well. Pt able to use with supervision. Pt to practice using it at home this weekend with wife supervising. Also addressed bilateral mid reach as well as unilateral low reach . For first time, pt able to complete unilateral low reach today with min compensations and no assistance.  OT Short Term Goals - 06/01/15 1727    OT SHORT TERM GOAL  #9   Status --  pt is now stepping over ledge and into shower wtih min with wife to sit on seat   OT SHORT TERM GOAL  #11   TITLE Pt and wife will be mod I with upgraded HEP - 05/23/2015 (date adjusted as pt missed 2 weeks of therapy)   Status Achieved   OT SHORT TERM GOAL  #12   TITLE Pt will be supevision for simple hot meal prep   Status Achieved  05/25/2015   OT SHORT TERM GOAL  #13   TITLE Pt will demonstrate ability for bilateral mid reach with light object without facilitation.   Status Achieved           OT Long Term Goals - 06/01/15 1727    OT LONG TERM GOAL #8   Title Pt will demonstrate the ability to use LUE as gross a for basic ADL's 100% of the time - 06/20/2015 (date adjusted as pt missed two weeks of therapy)   Status  On-going  able to use for bathing   OT LONG TERM GOAL  #10   TITLE Pt will be mod I for simple hot meal prep   Status On-going   OT LONG TERM GOAL  #11   TITLE Pt will demonstrate ability for functional tasks at ambulatory level for 10 minutes without needing rest break.   Status Achieved  with min facilitation and min compensations   OT LONG TERM GOAL  #12   TITLE Pt will demonstrate ability to release objects with mod facilitation.    Status On-going   OT LONG TERM GOAL  #13   TITLE Pt will demonstrate ability for unilateral low reach with assist for gross grasp with mn compensations.   Status On-going   OT LONG TERM GOAL  #14   TITLE Pt will be mod I with upgraded HEP prn   Status On-going               Plan - 06/01/15 1727    Clinical Impression Statement Pt continues to make slow but steady progress toward goals. Pt able to complete low unilateral reach today.   Pt will benefit from skilled therapeutic intervention in order to improve on the following deficits (Retired) Decreased balance;Decreased coordination;Decreased cognition;Decreased knowledge of use of DME;Decreased mobility;Decreased range of motion;Decreased safety awareness;Difficulty walking;Decreased strength;Increased edema;Increased muscle spasms;Impaired UE functional use;Impaired tone;Impaired sensation;Impaired vision/preception;Pain   Rehab Potential Good   Clinical Impairments Affecting Rehab Potential Pt will benefit from skilled OT to address deficits outlined in eval to maximize independence.   OT Frequency 2x / week   OT Duration 8 weeks   OT Treatment/Interventions Self-care/ADL training;Cryotherapy;Moist Heat;Electrical Stimulation;Fluidtherapy;DME and/or AE instruction;Energy conservation;Neuromuscular education;Therapeutic exercise;Functional Mobility Training;Manual Therapy;Passive range of motion;Splinting;Therapeutic activities;Balance training;Patient/family education;Cognitive  remediation/compensation   Plan NMR, work toward consistent unilateral reach with assisted grasp, grasp and release.    OT Home Exercise Plan upgraded HEP to include place and hold   Consulted and Agree with Plan of Care Patient;Family member/caregiver   Family Member Consulted wife        Problem List Patient Active Problem List   Diagnosis Date Noted  . 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 Spastic hemiparesis affecting nondominant side 11/08/2014  . Hypothyroidism 10/27/2014  . Left-sided neglect 09/23/2014  . Acute ischemic right MCA stroke 09/16/2014  . Left hemiparesis   . CVA (cerebral vascular  accident)   . Right carotid artery occlusion   . Acute right MCA stroke   . Essential hypertension 09/12/2014  . CVA (cerebral infarction) 09/12/2014  . Type 2 diabetes mellitus with peripheral neuropathy 09/12/2014    Quay Burow, OTR/L 06/01/2015, 5:32 PM  [facility] [address] [place], [place], [address] Phone: [phone]   Fax:  [phone]

## 2015-06-05 ENCOUNTER — Ambulatory Visit: Payer: 59 | Attending: Physical Medicine & Rehabilitation | Admitting: Occupational Therapy

## 2015-06-05 ENCOUNTER — Ambulatory Visit: Payer: 59 | Admitting: Physical Therapy

## 2015-06-05 ENCOUNTER — Encounter: Payer: Self-pay | Admitting: Occupational Therapy

## 2015-06-05 DIAGNOSIS — G8114 Spastic hemiplegia affecting left nondominant side: Secondary | ICD-10-CM

## 2015-06-05 DIAGNOSIS — R2681 Unsteadiness on feet: Secondary | ICD-10-CM | POA: Diagnosis present

## 2015-06-05 DIAGNOSIS — G8194 Hemiplegia, unspecified affecting left nondominant side: Secondary | ICD-10-CM | POA: Diagnosis present

## 2015-06-05 DIAGNOSIS — R414 Neurologic neglect syndrome: Secondary | ICD-10-CM | POA: Diagnosis present

## 2015-06-05 DIAGNOSIS — R269 Unspecified abnormalities of gait and mobility: Secondary | ICD-10-CM | POA: Insufficient documentation

## 2015-06-05 DIAGNOSIS — Z7409 Other reduced mobility: Secondary | ICD-10-CM | POA: Insufficient documentation

## 2015-06-05 DIAGNOSIS — R4189 Other symptoms and signs involving cognitive functions and awareness: Secondary | ICD-10-CM

## 2015-06-05 DIAGNOSIS — G8112 Spastic hemiplegia affecting left dominant side: Secondary | ICD-10-CM | POA: Diagnosis not present

## 2015-06-05 DIAGNOSIS — R201 Hypoesthesia of skin: Secondary | ICD-10-CM | POA: Insufficient documentation

## 2015-06-05 NOTE — Therapy (Signed)
[facility] [address] [place], [place], [address] Phone: [phone]   Fax:  [phone]  Occupational Therapy Treatment  Patient Details  Name: Brian Hall MRN: [id number] Date of Birth: [birth year] Referring Provider:  Claretta Fraise, MD  Encounter Date: 06/05/2015      OT End of Session - 06/05/15 1622    Visit Number 42   Number of Visits 43   Date for OT Re-Evaluation 06/20/15   Authorization Type [employer]   OT Start Time 1401   OT Stop Time 1445   OT Time Calculation (min) 44 min   Activity Tolerance Patient tolerated treatment well      Past Medical History  Diagnosis Date  . Hypertension   . Diabetes mellitus   . Obesity   . CHF (congestive heart failure) ([place])   . SOB (shortness of breath)   . Hyperkalemia   . Renal insufficiency   . Cardiogenic shock ([place])   . Cardiac LV ejection fraction 10-20%   . Hyperlipemia   . Hypothyroidism   . Throat cancer ([place]) 2011    s/p neck dissection, radiation, chemo  . Stroke ([place])   . Blurred vision   . Joint pain   . Joint swelling     Past Surgical History  Procedure Laterality Date  . Cardiac catheterization    . Neck dissection Right 2011    s/p resection of throat cancer with multiple nodes removed    There were no vitals filed for this visit.  Visit Diagnosis:  Left spastic hemiplegia ([facility])  Impaired functional mobility and activity tolerance  Impaired sensation  Impaired cognition  Left-sided neglect      Subjective Assessment - 06/05/15 1421    Subjective  I like the walker but we can't afford the walker splint.   Patient is accompained by: Family member  wife   Pertinent History see epic snapshot   Patient Stated Goals Get back to doing some of the things I used to do.   Currently in Pain? Yes   Pain Score 3    Pain Location Calf   Pain Orientation Left   Pain Descriptors / Indicators Aching;Dull   Pain Type Acute pain    Pain Onset More than a month ago   Pain Frequency Constant   Aggravating Factors  walking and standing on it   Pain Relieving Factors pain meds, rest, sitting down.   Multiple Pain Sites No                      OT Treatments/Exercises (OP) - 06/05/15 0001    ADLs   Functional Mobility Practiced use of walker using walker splint with emphasis on alignment,trunk control, full use of LUE to push walker symmetrically and reducing tone while walking. Pt and wife both prefer use of walker vs quad cane and used loaner walker splint over weekend. Pt and wife unable to afford a walker splint and shelf walker splint does not fit patient well, therefore started fabrication of walker splint today. Will complete next session.                   OT Short Term Goals - 06/05/15 1619    OT SHORT TERM GOAL  #9   Status --  pt is now stepping over ledge and into shower wtih min with wife to sit on seat   OT SHORT TERM GOAL  #11   TITLE Pt and wife will  be mod I with upgraded HEP - 05/23/2015 (date adjusted as pt missed 2 weeks of therapy)   Status Achieved   OT SHORT TERM GOAL  #12   TITLE Pt will be supevision for simple hot meal prep   Status Achieved  05/25/2015   OT SHORT TERM GOAL  #13   TITLE Pt will demonstrate ability for bilateral mid reach with light object without facilitation.   Status Achieved           OT Long Term Goals - 06/05/15 1619    OT LONG TERM GOAL #8   Title Pt will demonstrate the ability to use LUE as gross a for basic ADL's 100% of the time - 06/20/2015 (date adjusted as pt missed two weeks of therapy)   Status On-going  able to use for bathing   OT LONG TERM GOAL  #10   TITLE Pt will be mod I for simple hot meal prep   Status Achieved   OT LONG TERM GOAL  #11   TITLE Pt will demonstrate ability for functional tasks at ambulatory level for 10 minutes without needing rest break.   Status Achieved  with min facilitation and min compensations    OT LONG TERM GOAL  #12   TITLE Pt will demonstrate ability to release objects with mod facilitation.    Status On-going   OT LONG TERM GOAL  #13   TITLE Pt will demonstrate ability for unilateral low reach with assist for gross grasp with mn compensations.   Status On-going   OT LONG TERM GOAL  #14   TITLE Pt will be mod I with upgraded HEP prn   Status On-going               Plan - 06/05/15 1620    Clinical Impression Statement Pt and wife continue to make slow gains toward goals. Pt now using walker to facilitate more normal alignment, assist in reducing tone and increase functional use of LUE.    Pt will benefit from skilled therapeutic intervention in order to improve on the following deficits (Retired) Decreased balance;Decreased coordination;Decreased cognition;Decreased knowledge of use of DME;Decreased mobility;Decreased range of motion;Decreased safety awareness;Difficulty walking;Decreased strength;Increased edema;Increased muscle spasms;Impaired UE functional use;Impaired tone;Impaired sensation;Impaired vision/preception;Pain   Rehab Potential Good   Clinical Impairments Affecting Rehab Potential Pt will benefit from skilled OT to address deficits outlined in eval to maximize independence.   OT Frequency 2x / week   OT Duration 8 weeks   OT Treatment/Interventions Self-care/ADL training;Cryotherapy;Moist Heat;Electrical Stimulation;Fluidtherapy;DME and/or AE instruction;Energy conservation;Neuromuscular education;Therapeutic exercise;Functional Mobility Training;Manual Therapy;Passive range of motion;Splinting;Therapeutic activities;Balance training;Patient/family education;Cognitive remediation/compensation   Plan complete walker splint, work toward consistent unilateral reach with assisted grasp, grasp and release.    OT Home Exercise Plan upgraded HEP to include place and hold   Consulted and Agree with Plan of Care Patient;Family member/caregiver   Family Member  Consulted wife        Problem List Patient Active Problem List   Diagnosis Date Noted  . 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 Spastic hemiparesis affecting nondominant side ([place]) 11/08/2014  . Hypothyroidism 10/27/2014  . Left-sided neglect 09/23/2014  . Acute ischemic right MCA stroke ([place]) 09/16/2014  . Left hemiparesis ([place])   . CVA (cerebral vascular accident) ([place])   . Right carotid artery occlusion   . Acute right MCA stroke ([place])   . Essential hypertension 09/12/2014  . CVA (cerebral infarction) 09/12/2014  . Type 2 diabetes mellitus with  peripheral neuropathy ([place]) 09/12/2014    Quay Burow, OTR/L 06/05/2015, 4:39 PM  [facility] [address] [place], [place], [address] Phone: [phone]   Fax:  [phone]

## 2015-06-06 ENCOUNTER — Ambulatory Visit: Payer: 59 | Admitting: Physical Therapy

## 2015-06-06 ENCOUNTER — Ambulatory Visit: Payer: 59 | Admitting: Occupational Therapy

## 2015-06-06 ENCOUNTER — Encounter: Payer: Self-pay | Admitting: Occupational Therapy

## 2015-06-06 DIAGNOSIS — R2681 Unsteadiness on feet: Secondary | ICD-10-CM

## 2015-06-06 DIAGNOSIS — G8112 Spastic hemiplegia affecting left dominant side: Secondary | ICD-10-CM | POA: Diagnosis not present

## 2015-06-06 DIAGNOSIS — G8114 Spastic hemiplegia affecting left nondominant side: Secondary | ICD-10-CM

## 2015-06-06 NOTE — Therapy (Signed)
[facility] [address] [place], [place], [address] Phone: [phone]   Fax:  [phone]  Occupational Therapy Treatment  Patient Details  Name: Brian Hall MRN: [id number] Date of Birth: 1955-12-12 Referring Provider:  Claretta Fraise, MD  Encounter Date: 06/06/2015      OT End of Session - 06/06/15 1658    Visit Number 43   Number of Visits 68   Date for OT Re-Evaluation 06/20/15   Authorization Type [employer]   OT Start Time 1447   OT Stop Time 1535   OT Time Calculation (min) 48 min   Activity Tolerance Patient tolerated treatment well   Behavior During Therapy [facility] for tasks assessed/performed      Past Medical History  Diagnosis Date  . Hypertension   . Diabetes mellitus   . Obesity   . CHF (congestive heart failure) ([place])   . SOB (shortness of breath)   . Hyperkalemia   . Renal insufficiency   . Cardiogenic shock ([place])   . Cardiac LV ejection fraction 10-20%   . Hyperlipemia   . Hypothyroidism   . Throat cancer ([place]) 2011    s/p neck dissection, radiation, chemo  . Stroke ([place])   . Blurred vision   . Joint pain   . Joint swelling     Past Surgical History  Procedure Laterality Date  . Cardiac catheterization    . Neck dissection Right 2011    s/p resection of throat cancer with multiple nodes removed    There were no vitals filed for this visit.  Visit Diagnosis:  Left spastic hemiplegia ([place])  Unsteadiness      Subjective Assessment - 06/06/15 1654    Subjective  This tone really makes it hard to open my hand   Patient is accompained by: Family member   Patient Stated Goals Get back to doing some of the things I used to do.   Currently in Pain? No/denies   Pain Score 0-No pain                      OT Treatments/Exercises (OP) - 06/06/15 0001    ADLs   Functional Mobility Problem solved with patient and wife the best option for maintaining hand on walker to  improve alignment for walking.  Patient has custom molded walker splint and practiced several different models for strapping to ensure hand stays securely and in good alignment on walker.  Patient's wife actively involved in problem solving and was able to take photo of final product to try at home.                  OT Education - 06/06/15 1657    Education provided Yes   Education Details starpping hand for walker use   Person(s) Educated Patient;Spouse   Methods Demonstration;Explanation   Comprehension Verbalized understanding;Returned demonstration          OT Short Term Goals - 06/05/15 1619    OT SHORT TERM GOAL  #9   Status --  pt is now stepping over ledge and into shower wtih min with wife to sit on seat   OT SHORT TERM GOAL  #11   TITLE Pt and wife will be mod I with upgraded HEP - 05/23/2015 (date adjusted as pt missed 2 weeks of therapy)   Status Achieved   OT SHORT TERM GOAL  #12   TITLE Pt will be supevision for simple hot meal prep  Status Achieved  05/25/2015   OT SHORT TERM GOAL  #13   TITLE Pt will demonstrate ability for bilateral mid reach with light object without facilitation.   Status Achieved           OT Long Term Goals - 06/05/15 1619    OT LONG TERM GOAL #8   Title Pt will demonstrate the ability to use LUE as gross a for basic ADL's 100% of the time - 06/20/2015 (date adjusted as pt missed two weeks of therapy)   Status On-going  able to use for bathing   OT LONG TERM GOAL  #10   TITLE Pt will be mod I for simple hot meal prep   Status Achieved   OT LONG TERM GOAL  #11   TITLE Pt will demonstrate ability for functional tasks at ambulatory level for 10 minutes without needing rest break.   Status Achieved  with min facilitation and min compensations   OT LONG TERM GOAL  #12   TITLE Pt will demonstrate ability to release objects with mod facilitation.    Status On-going   OT LONG TERM GOAL  #13   TITLE Pt will demonstrate ability for  unilateral low reach with assist for gross grasp with mn compensations.   Status On-going   OT LONG TERM GOAL  #14   TITLE Pt will be mod I with upgraded HEP prn   Status On-going               Plan - 06/06/15 1658    Clinical Impression Statement Patient and wife very invested in the rehab process, and work actively with therapists to ensure best carryover at home.  Progressing slowly toward end goals   Pt will benefit from skilled therapeutic intervention in order to improve on the following deficits (Retired) Decreased balance;Decreased coordination;Decreased cognition;Decreased knowledge of use of DME;Decreased mobility;Decreased range of motion;Decreased safety awareness;Difficulty walking;Decreased strength;Increased edema;Increased muscle spasms;Impaired UE functional use;Impaired tone;Impaired sensation;Impaired vision/preception;Pain   Rehab Potential Good   OT Frequency 2x / week   OT Duration 8 weeks   OT Treatment/Interventions Self-care/ADL training;Cryotherapy;Moist Heat;Electrical Stimulation;Fluidtherapy;DME and/or AE instruction;Energy conservation;Neuromuscular education;Therapeutic exercise;Functional Mobility Training;Manual Therapy;Passive range of motion;Splinting;Therapeutic activities;Balance training;Patient/family education;Cognitive remediation/compensation   Plan unilateral reach with assisted grasp / release   Consulted and Agree with Plan of Care Patient;Family member/caregiver   Family Member Consulted wife        Problem List Patient Active Problem List   Diagnosis Date Noted  . 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 Spastic hemiparesis affecting nondominant side ([place]) 11/08/2014  . Hypothyroidism 10/27/2014  . Left-sided neglect 09/23/2014  . Acute ischemic right MCA stroke ([place]) 09/16/2014  . Left hemiparesis ([place])   . CVA (cerebral vascular accident) ([place])   . Right carotid artery occlusion   . Acute right MCA stroke ([place])   .  Essential hypertension 09/12/2014  . CVA (cerebral infarction) 09/12/2014  . Type 2 diabetes mellitus with peripheral neuropathy ([place]) 09/12/2014    Mariah Milling, OTR/L 06/06/2015, 5:01 PM  [facility] [address] [place], [place], [address] Phone: [phone]   Fax:  [phone]

## 2015-06-12 ENCOUNTER — Ambulatory Visit: Payer: 59 | Admitting: Physical Therapy

## 2015-06-12 ENCOUNTER — Encounter: Payer: Self-pay | Admitting: Occupational Therapy

## 2015-06-12 ENCOUNTER — Ambulatory Visit: Payer: 59 | Admitting: Occupational Therapy

## 2015-06-12 DIAGNOSIS — R4189 Other symptoms and signs involving cognitive functions and awareness: Secondary | ICD-10-CM

## 2015-06-12 DIAGNOSIS — R414 Neurologic neglect syndrome: Secondary | ICD-10-CM

## 2015-06-12 DIAGNOSIS — G8112 Spastic hemiplegia affecting left dominant side: Secondary | ICD-10-CM | POA: Diagnosis not present

## 2015-06-12 DIAGNOSIS — G8114 Spastic hemiplegia affecting left nondominant side: Secondary | ICD-10-CM

## 2015-06-12 DIAGNOSIS — Z7409 Other reduced mobility: Secondary | ICD-10-CM

## 2015-06-12 DIAGNOSIS — R201 Hypoesthesia of skin: Secondary | ICD-10-CM

## 2015-06-12 NOTE — Therapy (Signed)
[facility] [address] [place], [place], [address] Phone: [phone]   Fax:  [phone]  Occupational Therapy Treatment  Patient Details  Name: Brian Hall MRN: [id number] Date of Birth: [birth year] Referring Provider:  Claretta Fraise, MD  Encounter Date: 06/12/2015      OT End of Session - 06/12/15 1554    Visit Number 80   Number of Visits 14   Date for OT Re-Evaluation 06/20/15   Authorization Type [employer]   OT Start Time 1400   OT Stop Time 1444   OT Time Calculation (min) 44 min   Activity Tolerance Patient tolerated treatment well      Past Medical History  Diagnosis Date  . Hypertension   . Diabetes mellitus   . Obesity   . CHF (congestive heart failure) ([place])   . SOB (shortness of breath)   . Hyperkalemia   . Renal insufficiency   . Cardiogenic shock ([place])   . Cardiac LV ejection fraction 10-20%   . Hyperlipemia   . Hypothyroidism   . Throat cancer ([place]) 2011    s/p neck dissection, radiation, chemo  . Stroke ([place])   . Blurred vision   . Joint pain   . Joint swelling     Past Surgical History  Procedure Laterality Date  . Cardiac catheterization    . Neck dissection Right 2011    s/p resection of throat cancer with multiple nodes removed    There were no vitals filed for this visit.  Visit Diagnosis:  Impaired sensation  Impaired functional mobility and activity tolerance  Impaired cognition  Left-sided neglect  Left spastic hemiplegia ([facility])      Subjective Assessment - 06/12/15 1408    Subjective  I can use this walker better and I am glad my arm is working more   Patient is accompained by: Family member  wife   Pertinent History see epic snapshot   Patient Stated Goals Get back to doing some of the things I used to do.   Currently in Pain? Yes   Pain Score 4    Pain Location Calf   Pain Orientation Left   Pain Descriptors / Indicators Aching;Dull   Pain Type  Acute pain   Pain Onset More than a month ago   Pain Frequency Constant   Aggravating Factors  walking and standing   Pain Relieving Factors pain meds, rest, sitting down   Effect of Pain on Daily Activities limites walking tolerance   Multiple Pain Sites Yes   Pain Score 1   Pain Location Wrist   Pain Orientation Left   Pain Descriptors / Indicators Sore   Pain Type Chronic pain   Pain Onset More than a month ago   Pain Frequency Intermittent   Aggravating Factors  catching it, moving it too fast - it isn't as sore since I am using it on the walker   Pain Relieving Factors meds, rest, support, using walker                      OT Treatments/Exercises (OP) - 06/12/15 0001    Neurological Re-education Exercises   Other Exercises 1 Following estim, neuro re ed to address facilitated initation of finger extension. Pt has ability now to totally "turn off" finger flexors and very slight initation of finger extension in R index and middle finger at MCP joint. Severe spasticity inteferes with pt's ability to access any potential finger or wrist  extension. Will contact MD to determine if MD would consider additional botox for R hand in early November.  Neuro re ed to also address  unilateral low reach - pt able to complete low reach with min compensations if gasp is assisted - min to mod compensations if grasp is not assisted.. Pt able to reach mid reach with bilateral reach with min compensations.   Chief of Staff Action wrist and finger extension   Electrical Stimulation Parameters 250 pw, 50 pps, 10 sec cycle, 2 sec ramp, intensity 35-40   Electrical Stimulation Goals Tone;Neuromuscular facilitation;Pain                  OT Short Term Goals - 06/12/15 1549    OT SHORT TERM GOAL  #9   Status --  pt is now stepping over ledge and into shower wtih min with wife to sit on seat   OT SHORT  TERM GOAL  #11   TITLE Pt and wife will be mod I with upgraded HEP - 05/23/2015 (date adjusted as pt missed 2 weeks of therapy)   Status Achieved   OT SHORT TERM GOAL  #12   TITLE Pt will be supevision for simple hot meal prep   Status Achieved  05/25/2015   OT SHORT TERM GOAL  #13   TITLE Pt will demonstrate ability for bilateral mid reach with light object without facilitation.   Status Achieved           OT Long Term Goals - 06/12/15 1550    OT LONG TERM GOAL #8   Title Pt will demonstrate the ability to use LUE as gross a for basic ADL's 100% of the time - 06/20/2015 (date adjusted as pt missed two weeks of therapy)   Status On-going  able to use for bathing   OT LONG TERM GOAL  #10   TITLE Pt will be mod I for simple hot meal prep   Status Achieved   OT LONG TERM GOAL  #11   TITLE Pt will demonstrate ability for functional tasks at ambulatory level for 10 minutes without needing rest break.   Status Achieved  with min facilitation and min compensations   OT LONG TERM GOAL  #12   TITLE Pt will demonstrate ability to release objects with mod facilitation.    Status On-going   OT LONG TERM GOAL  #13   TITLE Pt will demonstrate ability for unilateral low reach with assist for gross grasp with mn compensations.   Status Achieved   OT LONG TERM GOAL  #14   TITLE Pt will be mod I with upgraded HEP prn   Status Achieved               Plan - 06/12/15 1552    Clinical Impression Statement Pt continues to make very slow but steady progress toward goals. Continuing severe spasticity interferes with pt's ability to access any potential ability for isolated finger extension.    Pt will benefit from skilled therapeutic intervention in order to improve on the following deficits (Retired) Decreased balance;Decreased coordination;Decreased cognition;Decreased knowledge of use of DME;Decreased mobility;Decreased range of motion;Decreased safety awareness;Difficulty walking;Decreased  strength;Increased edema;Increased muscle spasms;Impaired UE functional use;Impaired tone;Impaired sensation;Impaired vision/preception;Pain   Rehab Potential Good   Clinical Impairments Affecting Rehab Potential Pt will benefit from skilled OT to address deficits outlined in eval to maximize independence.   OT Frequency 2x / week   OT  Duration 8 weeks   OT Treatment/Interventions Self-care/ADL training;Cryotherapy;Moist Heat;Electrical Stimulation;Fluidtherapy;DME and/or AE instruction;Energy conservation;Neuromuscular education;Therapeutic exercise;Functional Mobility Training;Manual Therapy;Passive range of motion;Splinting;Therapeutic activities;Balance training;Patient/family education;Cognitive remediation/compensation   Plan grasp and release, unilateral reach, guided gross assist for functional tasks   OT Home Exercise Plan upgraded HEP to include place and hold   Consulted and Agree with Plan of Care Patient;Family member/caregiver   Family Member Consulted wife        Problem List Patient Active Problem List   Diagnosis Date Noted  . 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 Spastic hemiparesis affecting nondominant side ([place]) 11/08/2014  . Hypothyroidism 10/27/2014  . Left-sided neglect 09/23/2014  . Acute ischemic right MCA stroke ([place]) 09/16/2014  . Left hemiparesis ([place])   . CVA (cerebral vascular accident) ([place])   . Right carotid artery occlusion   . Acute right MCA stroke ([place])   . Essential hypertension 09/12/2014  . CVA (cerebral infarction) 09/12/2014  . Type 2 diabetes mellitus with peripheral neuropathy ([place]) 09/12/2014    [place] Burow, OTR/L 06/12/2015, 3:55 PM  [facility] [address] [place], [place], [address] Phone: [phone]   Fax:  [phone]

## 2015-06-15 ENCOUNTER — Ambulatory Visit: Payer: 59 | Admitting: Occupational Therapy

## 2015-06-15 ENCOUNTER — Ambulatory Visit: Payer: 59 | Admitting: Physical Therapy

## 2015-06-15 ENCOUNTER — Encounter: Payer: Self-pay | Admitting: Occupational Therapy

## 2015-06-15 DIAGNOSIS — R414 Neurologic neglect syndrome: Secondary | ICD-10-CM

## 2015-06-15 DIAGNOSIS — Z7409 Other reduced mobility: Secondary | ICD-10-CM

## 2015-06-15 DIAGNOSIS — R4189 Other symptoms and signs involving cognitive functions and awareness: Secondary | ICD-10-CM

## 2015-06-15 DIAGNOSIS — R201 Hypoesthesia of skin: Secondary | ICD-10-CM

## 2015-06-15 DIAGNOSIS — G8114 Spastic hemiplegia affecting left nondominant side: Secondary | ICD-10-CM

## 2015-06-15 DIAGNOSIS — G8112 Spastic hemiplegia affecting left dominant side: Secondary | ICD-10-CM | POA: Diagnosis not present

## 2015-06-15 NOTE — Therapy (Signed)
[facility] [address] [place], [place], [address] Phone: [phone]   Fax:  [phone]  Occupational Therapy Treatment  Patient Details  Name: Brian Hall MRN: [id number] Date of Birth: 1955/12/07 Referring Provider:  Claretta Fraise, MD  Encounter Date: 06/15/2015      OT End of Session - 06/15/15 1547    Visit Number 45   Number of Visits 54   Date for OT Re-Evaluation 06/20/15   Authorization Type [employer]   OT Start Time 1401   OT Stop Time 1445   OT Time Calculation (min) 44 min   Activity Tolerance Patient tolerated treatment well      Past Medical History  Diagnosis Date  . Hypertension   . Diabetes mellitus   . Obesity   . CHF (congestive heart failure) ([place])   . SOB (shortness of breath)   . Hyperkalemia   . Renal insufficiency   . Cardiogenic shock ([place])   . Cardiac LV ejection fraction 10-20%   . Hyperlipemia   . Hypothyroidism   . Throat cancer ([place]) 2011    s/p neck dissection, radiation, chemo  . Stroke ([place])   . Blurred vision   . Joint pain   . Joint swelling     Past Surgical History  Procedure Laterality Date  . Cardiac catheterization    . Neck dissection Right 2011    s/p resection of throat cancer with multiple nodes removed    There were no vitals filed for this visit.  Visit Diagnosis:  Impaired functional mobility and activity tolerance  Impaired sensation  Impaired cognition  Left-sided neglect  Left spastic hemiplegia ([facility])      Subjective Assessment - 06/15/15 1407    Subjective  We are going to the beach   Patient is accompained by: Family member  wife   Pertinent History see epic snapshot   Patient Stated Goals Get back to doing some of the things I used to do.   Currently in Pain? Yes   Pain Score 1    Pain Location Calf   Pain Orientation Left   Pain Descriptors / Indicators Aching;Dull   Pain Type Acute pain   Pain Onset More than a  month ago   Pain Frequency Constant   Aggravating Factors  sitting too much, not moving it enough   Pain Relieving Factors walking and standing are now making it better, pain, meds    Pain Score 2   Pain Location Wrist   Pain Orientation Left   Pain Descriptors / Indicators Sore   Pain Type Chronic pain   Pain Onset More than a month ago   Pain Frequency Intermittent   Aggravating Factors  catching it, moving it too fast   Pain Relieving Factors using walker, rest, support, meds                      OT Treatments/Exercises (OP) - 06/15/15 0001    Neurological Re-education Exercises   Other Exercises 1 Neuro re ed to address facilitation of grasp and release with greater emphasis on release as well as grading grasp to avoid full flexor tone grasp. Pt improving in ability to monitor this. Pt with trace extension in thumb and index finger.  Pt is able to "turn off" left hand in order to reduce tone.  Also addressed low bilateral reach with open hand, closed chain activity also with grading effort.    Acupuncturist  Stimulation Location forearm/wrist   Electrical Stimulation Action wrist and finger extension   Electrical Stimulation Parameters 250 pw, 50pps, 10 sec cycle, 2 sec ramp, intensity 35-38   Electrical Stimulation Goals Tone;Pain;Neuromuscular facilitation                  OT Short Term Goals - 06/15/15 1544    OT SHORT TERM GOAL  #9   Status --  pt is now stepping over ledge and into shower wtih min with wife to sit on seat   OT SHORT TERM GOAL  #11   TITLE Pt and wife will be mod I with upgraded HEP - 05/23/2015 (date adjusted as pt missed 2 weeks of therapy)   Status Achieved   OT SHORT TERM GOAL  #12   TITLE Pt will be supevision for simple hot meal prep   Status Achieved  05/25/2015   OT SHORT TERM GOAL  #13   TITLE Pt will demonstrate ability for bilateral mid reach with light object without facilitation.   Status Achieved            OT Long Term Goals - 06/15/15 1545    OT LONG TERM GOAL #8   Title Pt will demonstrate the ability to use LUE as gross a for basic ADL's 100% of the time - 06/20/2015 (date adjusted as pt missed two weeks of therapy)   Status On-going  able to use for bathing   OT LONG TERM GOAL  #10   TITLE Pt will be mod I for simple hot meal prep   Status Achieved   OT LONG TERM GOAL  #11   TITLE Pt will demonstrate ability for functional tasks at ambulatory level for 10 minutes without needing rest break.   Status Achieved  with min facilitation and min compensations   OT LONG TERM GOAL  #12   TITLE Pt will demonstrate ability to release objects with mod facilitation.    Status On-going   OT LONG TERM GOAL  #13   TITLE Pt will demonstrate ability for unilateral low reach with assist for gross grasp with mn compensations.   Status Achieved   OT LONG TERM GOAL  #14   TITLE Pt will be mod I with upgraded HEP prn   Status Achieved               Plan - 06/15/15 1545    Clinical Impression Statement Pt making slow but steady progress. Have contacted MD to discuss potential of additional botox injection to LUE. Pt to see MD next week and will discuss.    Pt will benefit from skilled therapeutic intervention in order to improve on the following deficits (Retired) Decreased balance;Decreased coordination;Decreased cognition;Decreased knowledge of use of DME;Decreased mobility;Decreased range of motion;Decreased safety awareness;Difficulty walking;Decreased strength;Increased edema;Increased muscle spasms;Impaired UE functional use;Impaired tone;Impaired sensation;Impaired vision/preception;Pain   Rehab Potential Good   Clinical Impairments Affecting Rehab Potential Pt will benefit from skilled OT to address deficits outlined in eval to maximize independence.   OT Frequency 2x / week   OT Duration 8 weeks   OT Treatment/Interventions Self-care/ADL training;Cryotherapy;Moist  Heat;Electrical Stimulation;Fluidtherapy;DME and/or AE instruction;Energy conservation;Neuromuscular education;Therapeutic exercise;Functional Mobility Training;Manual Therapy;Passive range of motion;Splinting;Therapeutic activities;Balance training;Patient/family education;Cognitive remediation/compensation   Plan e stim, neuro re ed to address low to mid reach, grasp and release, guided gross assist for functional tasks   OT Home Exercise Plan upgraded HEP to include place and hold   Consulted and Agree with Plan of Care Patient;Family  member/caregiver   Family Member Consulted wife        Problem List Patient Active Problem List   Diagnosis Date Noted  . 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 Spastic hemiparesis affecting nondominant side ([place]) 11/08/2014  . Hypothyroidism 10/27/2014  . Left-sided neglect 09/23/2014  . Acute ischemic right MCA stroke ([place]) 09/16/2014  . Left hemiparesis ([place])   . CVA (cerebral vascular accident) ([place])   . Right carotid artery occlusion   . Acute right MCA stroke ([place])   . Essential hypertension 09/12/2014  . CVA (cerebral infarction) 09/12/2014  . Type 2 diabetes mellitus with peripheral neuropathy ([place]) 09/12/2014    [place] Burow, OTR/L 06/15/2015, 3:48 PM  [facility] [address] [place], [place], [address] Phone: [phone]   Fax:  [phone]

## 2015-06-19 ENCOUNTER — Encounter: Payer: Self-pay | Admitting: Occupational Therapy

## 2015-06-19 ENCOUNTER — Ambulatory Visit: Payer: 59 | Admitting: Physical Therapy

## 2015-06-19 ENCOUNTER — Ambulatory Visit (HOSPITAL_BASED_OUTPATIENT_CLINIC_OR_DEPARTMENT_OTHER): Payer: 59 | Admitting: Physical Medicine & Rehabilitation

## 2015-06-19 ENCOUNTER — Ambulatory Visit: Payer: 59 | Admitting: Occupational Therapy

## 2015-06-19 ENCOUNTER — Encounter: Payer: Self-pay | Admitting: Physical Medicine & Rehabilitation

## 2015-06-19 ENCOUNTER — Encounter: Payer: 59 | Attending: Physical Medicine & Rehabilitation

## 2015-06-19 VITALS — BP 153/73 | HR 89

## 2015-06-19 DIAGNOSIS — R414 Neurologic neglect syndrome: Secondary | ICD-10-CM

## 2015-06-19 DIAGNOSIS — Z7409 Other reduced mobility: Secondary | ICD-10-CM

## 2015-06-19 DIAGNOSIS — I63511 Cerebral infarction due to unspecified occlusion or stenosis of right middle cerebral artery: Secondary | ICD-10-CM | POA: Diagnosis not present

## 2015-06-19 DIAGNOSIS — R4189 Other symptoms and signs involving cognitive functions and awareness: Secondary | ICD-10-CM

## 2015-06-19 DIAGNOSIS — G8114 Spastic hemiplegia affecting left nondominant side: Secondary | ICD-10-CM

## 2015-06-19 DIAGNOSIS — R201 Hypoesthesia of skin: Secondary | ICD-10-CM

## 2015-06-19 DIAGNOSIS — G8110 Spastic hemiplegia affecting unspecified side: Secondary | ICD-10-CM | POA: Insufficient documentation

## 2015-06-19 DIAGNOSIS — G8112 Spastic hemiplegia affecting left dominant side: Secondary | ICD-10-CM | POA: Diagnosis not present

## 2015-06-19 NOTE — Progress Notes (Signed)
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  Informed consent was obtained after describing the risks and benefits of the procedure with the patient this includes bleeding bruising and infection as well as medication side effects. The patient elected to proceed and has given written consent. Patient placed in a prone position on the exam table. External DC stimulation was applied to the popliteal space using a nerve stimulator. Plantar flexion twitch was obtained. The popliteal region was prepped with Betadine and then entered with a 22-gauge 40 mm needle electrode under electrical stimulation guidance. Plantar flexion which was obtained and confirmed. Then 4 cc of 5% phenol were injected. The patient tolerated procedure well. Post procedure instructions and followup visit were given. 

## 2015-06-19 NOTE — Therapy (Signed)
[facility] [address] [place], [place], [address] Phone: [phone]   Fax:  [phone]  Occupational Therapy Treatment  Patient Details  Name: Brian Hall MRN: [id number] Date of Birth: 18-Dec-1955 No Data Recorded  Encounter Date: 06/19/2015      OT End of Session - 06/19/15 1609    Visit Number 75   Number of Visits 75   Date for OT Re-Evaluation 06/20/15   Authorization Type [employer]   OT Start Time 1401   OT Stop Time 1443   OT Time Calculation (min) 42 min   Activity Tolerance Patient tolerated treatment well      Past Medical History  Diagnosis Date  . Hypertension   . Diabetes mellitus   . Obesity   . CHF (congestive heart failure) ([facility])   . SOB (shortness of breath)   . Hyperkalemia   . Renal insufficiency   . Cardiogenic shock ([facility])   . Cardiac LV ejection fraction 10-20%   . Hyperlipemia   . Hypothyroidism   . Throat cancer ([facility]) 2011    s/p neck dissection, radiation, chemo  . Stroke ([place] [facility] [place])   . Blurred vision   . Joint pain   . Joint swelling     Past Surgical History  Procedure Laterality Date  . Cardiac catheterization    . Neck dissection Right 2011    s/p resection of throat cancer with multiple nodes removed    There were no vitals filed for this visit.  Visit Diagnosis:  Impaired functional mobility and activity tolerance  Impaired sensation  Impaired cognition  Left-sided neglect  Left spastic hemiplegia (HCC)      Subjective Assessment - 06/19/15 1409    Subjective  We had a great time at the beach and I did a lot of things   Patient is accompained by: Family member  wife   Pertinent History see epic snapshot   Patient Stated Goals Get back to doing some of the things I used to do.   Currently in Pain? Yes   Pain Score 3    Pain Location Calf   Pain Orientation Left   Pain Descriptors / Indicators Aching;Dull   Pain Type Acute pain   Pain Onset More  than a month ago   Pain Frequency Constant   Aggravating Factors  I had a phenol block so the calf is a little more sore again, sitting too much, not moving enough   Pain Relieving Factors walking and standing ar now making it better, pain meds   Effect of Pain on Daily Activities l   Multiple Pain Sites Yes   Pain Score 3   Pain Location Wrist   Pain Orientation Left   Pain Descriptors / Indicators Sore   Pain Type Chronic pain   Pain Onset More than a month ago   Pain Frequency Intermittent   Aggravating Factors  catching it, moving it too fast   Pain Relieving Factors using the walker, rest, support, meds                      OT Treatments/Exercises (OP) - 06/19/15 0001    ADLs   UB Dressing Practiced doning and doffing shirt with emphasis on actively using RUE in activity. Pt is exremely impulsive and still exhibits perceputal deficits in dressing. Required multiple repetitions of activity and max cues to slow pt down and focus on actively incorpoating RUE. Wife prsent and able to cue pt  in activity and pt did improve with practice. Pt able to actively use RUE to don and doff shirt with modeate cueing and set up to activity.                    OT Short Term Goals - 06/19/15 1605    OT SHORT TERM GOAL  #9   Status --  pt is now stepping over ledge and into shower wtih min with wife to sit on seat   OT SHORT TERM GOAL  #11   TITLE Pt and wife will be mod I with upgraded HEP - 05/23/2015 (date adjusted as pt missed 2 weeks of therapy)   Status Achieved   OT SHORT TERM GOAL  #12   TITLE Pt will be supevision for simple hot meal prep   Status Achieved  05/25/2015   OT SHORT TERM GOAL  #13   TITLE Pt will demonstrate ability for bilateral mid reach with light object without facilitation.   Status Achieved           OT Long Term Goals - 06/19/15 1606    OT LONG TERM GOAL #8   Title Pt will demonstrate the ability to use LUE as gross a for basic ADL's  100% of the time - 06/20/2015 (date adjusted as pt missed two weeks of therapy)   Status On-going  able to use for bathing   OT LONG TERM GOAL  #10   TITLE Pt will be mod I for simple hot meal prep   Status Achieved   OT LONG TERM GOAL  #11   TITLE Pt will demonstrate ability for functional tasks at ambulatory level for 10 minutes without needing rest break.   Status Achieved  with min facilitation and min compensations   OT LONG TERM GOAL  #12   TITLE Pt will demonstrate ability to release objects with mod facilitation.    Status On-going   OT LONG TERM GOAL  #13   TITLE Pt will demonstrate ability for unilateral low reach with assist for gross grasp with mn compensations.   Status Achieved   OT LONG TERM GOAL  #14   TITLE Pt will be mod I with upgraded HEP prn   Status Achieved               Plan - 06/19/15 1606    Clinical Impression Statement Pt makiing slow progress in ability to use LUE in functional tasks. Pt is currently using LUE to hold objects and to assist in washing. Today practiced activitation in dressing.    Pt will benefit from skilled therapeutic intervention in order to improve on the following deficits (Retired) Decreased balance;Decreased coordination;Decreased cognition;Decreased knowledge of use of DME;Decreased mobility;Decreased range of motion;Decreased safety awareness;Difficulty walking;Decreased strength;Increased edema;Increased muscle spasms;Impaired UE functional use;Impaired tone;Impaired sensation;Impaired vision/preception;Pain   Rehab Potential Good   Clinical Impairments Affecting Rehab Potential apraxia, sensory loss,perceptual deficits, cognitive impairment   OT Frequency 2x / week   OT Duration 8 weeks   OT Treatment/Interventions Self-care/ADL training;Cryotherapy;Moist Heat;Electrical Stimulation;Fluidtherapy;DME and/or AE instruction;Energy conservation;Neuromuscular education;Therapeutic exercise;Functional Mobility Training;Manual  Therapy;Passive range of motion;Splinting;Therapeutic activities;Balance training;Patient/family education;Cognitive remediation/compensation   Plan LUE functional use, NMR    OT Home Exercise Plan upgraded HEP to include place and hold   Consulted and Agree with Plan of Care Patient;Family member/caregiver   Family Member Consulted wife        Problem List Patient Active Problem List   Diagnosis Date Noted  .  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 Spastic hemiparesis affecting nondominant side ([facility]) 11/08/2014  . Hypothyroidism 10/27/2014  . Left-sided neglect 09/23/2014  . Acute ischemic right MCA stroke ([facility]) 09/16/2014  . Left hemiparesis ([facility])   . CVA (cerebral vascular accident) ([facility])   . Right carotid artery occlusion   . Acute right MCA stroke ([facility])   . Essential hypertension 09/12/2014  . CVA (cerebral infarction) 09/12/2014  . Type 2 diabetes mellitus with peripheral neuropathy ([facility]) 09/12/2014    Quay Burow, OTR/L 06/19/2015, 4:11 PM  [facility] [address] [place], [place], [address] Phone: [phone]   Fax:  [phone]  Name: Brian Hall MRN: [id number] Date of Birth: [birth year]

## 2015-06-19 NOTE — Patient Instructions (Signed)
Tibial nerve block with phenol today. This medication may start taking the fact today however full effect will be at about one week Duration of the effect is 3-6 months Side effects of medication may include right heel numbness or burning. Call if you have burning pain so we can recommend any medication for that. 

## 2015-06-20 ENCOUNTER — Encounter: Payer: Self-pay | Admitting: Occupational Therapy

## 2015-06-20 ENCOUNTER — Ambulatory Visit: Payer: 59 | Admitting: Physical Therapy

## 2015-06-20 ENCOUNTER — Ambulatory Visit: Payer: 59 | Admitting: Occupational Therapy

## 2015-06-20 DIAGNOSIS — R414 Neurologic neglect syndrome: Secondary | ICD-10-CM

## 2015-06-20 DIAGNOSIS — R201 Hypoesthesia of skin: Secondary | ICD-10-CM

## 2015-06-20 DIAGNOSIS — Z7409 Other reduced mobility: Secondary | ICD-10-CM

## 2015-06-20 DIAGNOSIS — R4189 Other symptoms and signs involving cognitive functions and awareness: Secondary | ICD-10-CM

## 2015-06-20 DIAGNOSIS — G8114 Spastic hemiplegia affecting left nondominant side: Secondary | ICD-10-CM

## 2015-06-20 DIAGNOSIS — G8112 Spastic hemiplegia affecting left dominant side: Secondary | ICD-10-CM | POA: Diagnosis not present

## 2015-06-20 NOTE — Therapy (Signed)
[facility] [address] [place], [place], [address] Phone: [phone]   Fax:  [phone]  Occupational Therapy Treatment  Patient Details  Name: Brian Hall MRN: [id number] Date of Birth: 10/05/1955 No Data Recorded  Encounter Date: 06/20/2015      OT End of Session - 06/20/15 1657    Visit Number 35   Number of Visits 23   Date for OT Re-Evaluation 06/20/15   Authorization Type [employer]   OT Start Time 1447   OT Stop Time 1530   OT Time Calculation (min) 43 min   Activity Tolerance Patient tolerated treatment well      Past Medical History  Diagnosis Date  . Hypertension   . Diabetes mellitus   . Obesity   . CHF (congestive heart failure) ([facility])   . SOB (shortness of breath)   . Hyperkalemia   . Renal insufficiency   . Cardiogenic shock ([facility])   . Cardiac LV ejection fraction 10-20%   . Hyperlipemia   . Hypothyroidism   . Throat cancer ([facility]) 2011    s/p neck dissection, radiation, chemo  . Stroke ([facility])   . Blurred vision   . Joint pain   . Joint swelling     Past Surgical History  Procedure Laterality Date  . Cardiac catheterization    . Neck dissection Right 2011    s/p resection of throat cancer with multiple nodes removed    There were no vitals filed for this visit.  Visit Diagnosis:  Impaired functional mobility and activity tolerance  Impaired sensation  Impaired cognition  Left-sided neglect  Left spastic hemiplegia (HCC)      Subjective Assessment - 06/20/15 1459    Subjective  I think my botox injection is scheduled for November   Patient is accompained by: Family member  wife   Pertinent History see epic snapshot   Patient Stated Goals Get back to doing some of the things I used to do.   Currently in Pain? Yes   Pain Score 4    Pain Location Calf   Pain Orientation Left   Pain Descriptors / Indicators Aching;Dull   Pain Type Acute pain   Pain Onset More than a  month ago   Pain Frequency Intermittent   Aggravating Factors  immobility, not moving enough   Pain Relieving Factors walking and standing help.   Multiple Pain Sites Yes   Pain Score 3   Pain Location Wrist   Pain Orientation Left   Pain Descriptors / Indicators Sore   Pain Type Chronic pain   Pain Onset More than a month ago   Pain Frequency Intermittent   Aggravating Factors  catching it, moving it too fast   Pain Relieving Factors using the walker, rest, support, meds                      OT Treatments/Exercises (OP) - 06/20/15 0001    Neurological Re-education Exercises   Other Exercises 1 After e stim, neuro re ed to address isolated grasp and beginnng extension in hand. Pt requires mod vc's to grade grip to truly use isolated grip vs tone. Pt needs max faciliation for relaxation in preparation for release. Pt able to hold object and reach to floor and then lap with object.    Chief of Staff Action wrist and finger extension   Electrical Stimulation Parameters 250 pw, 50 pps, 10  sec cycle, 2 sec ramp, intensity 45   Electrical Stimulation Goals Tone;Pain;Neuromuscular facilitation                  OT Short Term Goals - 06/20/15 1655    OT SHORT TERM GOAL  #9   Status --  pt is now stepping over ledge and into shower wtih min with wife to sit on seat   OT SHORT TERM GOAL  #11   TITLE Pt and wife will be mod I with upgraded HEP - 05/23/2015 (date adjusted as pt missed 2 weeks of therapy)   Status Achieved   OT SHORT TERM GOAL  #12   TITLE Pt will be supevision for simple hot meal prep   Status Achieved  05/25/2015   OT SHORT TERM GOAL  #13   TITLE Pt will demonstrate ability for bilateral mid reach with light object without facilitation.   Status Achieved           OT Long Term Goals - 06/20/15 1655    OT LONG TERM GOAL #8   Title Pt will demonstrate the ability  to use LUE as gross a for basic ADL's 100% of the time - 06/20/2015 (date adjusted as pt missed two weeks of therapy)   Status On-going  able to use for bathing   OT LONG TERM GOAL  #10   TITLE Pt will be mod I for simple hot meal prep   Status Achieved   OT LONG TERM GOAL  #11   TITLE Pt will demonstrate ability for functional tasks at ambulatory level for 10 minutes without needing rest break.   Status Achieved  with min facilitation and min compensations   OT LONG TERM GOAL  #12   TITLE Pt will demonstrate ability to release objects with mod facilitation.    Status On-going   OT LONG TERM GOAL  #13   TITLE Pt will demonstrate ability for unilateral low reach with assist for gross grasp with mn compensations.   Status Achieved   OT LONG TERM GOAL  #14   TITLE Pt will be mod I with upgraded HEP prn   Status Achieved               Plan - 06/20/15 1655    Clinical Impression Statement Pt with plan for botox injection on 07/17/2015. Pt to be placed on hold until beginning of December to maximize therapeutic benefit of botox injection. Pt and wife in agreement.    Pt will benefit from skilled therapeutic intervention in order to improve on the following deficits (Retired) Decreased balance;Decreased coordination;Decreased cognition;Decreased knowledge of use of DME;Decreased mobility;Decreased range of motion;Decreased safety awareness;Difficulty walking;Decreased strength;Increased edema;Increased muscle spasms;Impaired UE functional use;Impaired tone;Impaired sensation;Impaired vision/preception;Pain   Rehab Potential Good   Clinical Impairments Affecting Rehab Potential apraxia, sensory loss,perceptual deficits, cognitive impairment   OT Frequency 2x / week   OT Duration 8 weeks   OT Treatment/Interventions Self-care/ADL training;Cryotherapy;Moist Heat;Electrical Stimulation;Fluidtherapy;DME and/or AE instruction;Energy conservation;Neuromuscular education;Therapeutic  exercise;Functional Mobility Training;Manual Therapy;Passive range of motion;Splinting;Therapeutic activities;Balance training;Patient/family education;Cognitive remediation/compensation   Plan pt placed on hold until beginning of December due to botox injection on 07/17/2015   OT Home Exercise Plan upgraded HEP to include place and hold   Consulted and Agree with Plan of Care Patient;Family member/caregiver   Family Member Consulted wife        Problem List Patient Active Problem List   Diagnosis Date Noted  . History of ischemic right MCA stroke  05/26/2015  . Spastic hemiparesis affecting nondominant side ([facility]) 11/08/2014  . Hypothyroidism 10/27/2014  . Left-sided neglect 09/23/2014  . Acute ischemic right MCA stroke ([facility]) 09/16/2014  . Left hemiparesis ([facility])   . CVA (cerebral vascular accident) ([facility])   . Right carotid artery occlusion   . Acute right MCA stroke ([facility])   . Essential hypertension 09/12/2014  . CVA (cerebral infarction) 09/12/2014  . Type 2 diabetes mellitus with peripheral neuropathy ([facility]) 09/12/2014    Quay Burow, OTR/L 06/20/2015, 4:58 PM  [facility] [address] [place], [place], [address] Phone: [phone]   Fax:  [phone]  Name: Brian Hall MRN: [id number] Date of Birth: Aug 22, 1956

## 2015-06-27 ENCOUNTER — Encounter: Payer: 59 | Admitting: Occupational Therapy

## 2015-06-27 ENCOUNTER — Ambulatory Visit: Payer: 59 | Admitting: Physical Therapy

## 2015-06-27 DIAGNOSIS — R269 Unspecified abnormalities of gait and mobility: Secondary | ICD-10-CM

## 2015-06-27 DIAGNOSIS — G8112 Spastic hemiplegia affecting left dominant side: Secondary | ICD-10-CM | POA: Diagnosis not present

## 2015-06-27 DIAGNOSIS — G8194 Hemiplegia, unspecified affecting left nondominant side: Secondary | ICD-10-CM

## 2015-06-27 DIAGNOSIS — R2681 Unsteadiness on feet: Secondary | ICD-10-CM

## 2015-06-27 NOTE — Therapy (Signed)
[facility] [address] [place], [place], [address] Phone: [phone]   Fax:  [phone]  Physical Therapy Treatment  Patient Details  Name: Brian Hall MRN: [id number] Date of Birth: 10-30-1955 No Data Recorded  Encounter Date: 06/27/2015      PT End of Session - 06/27/15 2134    Visit Number 40   Number of Visits 49   Date for PT Re-Evaluation 08/11/15  Pt missed 7.5 weeks of PT due to gastrocnemius tear, L tibial nerve block   PT Start Time 1316   PT Stop Time 1400   PT Time Calculation (min) 44 min   Activity Tolerance Patient tolerated treatment well   Behavior During Therapy [facility] for tasks assessed/performed      Past Medical History  Diagnosis Date  . Hypertension   . Diabetes mellitus   . Obesity   . CHF (congestive heart failure) ([place])   . SOB (shortness of breath)   . Hyperkalemia   . Renal insufficiency   . Cardiogenic shock ([place])   . Cardiac LV ejection fraction 10-20%   . Hyperlipemia   . Hypothyroidism   . Throat cancer ([place]) 2011    s/p neck dissection, radiation, chemo  . Stroke ([place])   . Blurred vision   . Joint pain   . Joint swelling     Past Surgical History  Procedure Laterality Date  . Cardiac catheterization    . Neck dissection Right 2011    s/p resection of throat cancer with multiple nodes removed    There were no vitals filed for this visit.  Visit Diagnosis:  Abnormality of gait  Unsteadiness  Hemiplegia affecting left nondominant side (HCC)      Subjective Assessment - 06/27/15 1322    Subjective Pt had L tibial nerve block approximately 1 week ago. Since beginning ambulation with RW using L hand orthosis, pt has become mod I with household mobility (Per wife, "He just walks around the house on his own; I don't even watch him") and is distant supervision with community mobility. Pt ambulated into PT clinic today, whereas he typically uses w/c.   Patient is accompained by: Family member   Pertinent History Light LLE stretching only. CVA with L hemiparesis 09/2014.   Limitations Standing;Walking;House hold activities   Patient Stated Goals Pt's goal is to get back to walking and using L hand again.  He has a motorcycle and would like to ride it again.    Currently in Pain? Yes   Pain Score 2    Pain Location Wrist   Pain Orientation Left   Pain Descriptors / Indicators Aching;Dull   Pain Type Chronic pain   Pain Onset More than a month ago   Pain Frequency Intermittent   Aggravating Factors  when the hand orthosis on the rolling walker turns   Pain Relieving Factors "When I have the wrist brace on"   Pain Score 2   Pain Location Calf   Pain Orientation Left   Pain Descriptors / Indicators Sore   Pain Type Chronic pain   Pain Onset More than a month ago   Pain Frequency Intermittent   Aggravating Factors  "walking around on it a whole lot"   Pain Relieving Factors rest                         [facility] Adult PT Treatment/Exercise - 06/27/15 0001    Ambulation/Gait   Gait  Comments Addressed long term goals pertaining to stability/independence with gait and obstacle negotiation in community. see goal statuses for details.   Berg Balance Test   Sit to Stand Able to stand without using hands and stabilize independently   Standing Unsupported Able to stand safely 2 minutes   Sitting with Back Unsupported but Feet Supported on Floor or Stool Able to sit safely and securely 2 minutes   Stand to Sit Sits safely with minimal use of hands   Transfers Needs one person to assist  due to anterior LOB   Standing Unsupported with Eyes Closed Able to stand 10 seconds safely   Standing Ubsupported with Feet Together Able to place feet together independently and stand 1 minute safely   From Standing, Reach Forward with Outstretched Arm Can reach confidently >25 cm (10")   From Standing Position, Pick up Object from Floor Able to  pick up shoe, needs supervision   From Standing Position, Turn to Look Behind Over each Shoulder Looks behind one side only/other side shows less weight shift   Turn 360 Degrees Needs close supervision or verbal cueing   Standing Unsupported, Alternately Place Feet on Step/Stool Needs assistance to keep from falling or unable to try  due to LLE extensor synergy   Standing Unsupported, One Foot in Front Able to plae foot ahead of the other independently and hold 30 seconds   Standing on One Leg Able to lift leg independently and hold 5-10 seconds   Total Score 42                PT Education - 06/27/15 2139    Education provided Yes   Education Details Remaining goals and POC.   Person(s) Educated Patient;Spouse   Methods Explanation   Comprehension Verbalized understanding          PT Short Term Goals - 04/11/15 1658    PT SHORT TERM GOAL #1   Title Pt will perform HEP with supervision of family, for improved strength and balance. (Target 01/10/15)   Time 4   Period Weeks   Status Achieved   PT SHORT TERM GOAL #2   Title Pt will improve Timed UP and Go score to less than or equal to 35 seconds for decreased fall risk.   Baseline Discontinued due to Timed up and Go Test reinforcing increased velocity of movement, which increases spasticity and decreases quality of movement.   Time 4   Period Weeks   Status Achieved   PT SHORT TERM GOAL #3   Title Berg Balance score to be assessed, with pt scoring at least 5 additional points to decrease fall risk.   Time 4   Period Weeks   Status Achieved  Scored 33 today   PT SHORT TERM GOAL #4   Title Pt will perform at least 6 of 10 reps of sit<>stand transfers no UE support, with equal weightbearing through bilateral lower extremities, independently.   Time 4   Period Weeks   Status Achieved   PT SHORT TERM GOAL #5   Title Pt will improve TUG time from 29.69 sec to 25 seconds for decreased fall risk. Target date: 03/08/15.    Baseline Discontinued due to Timed up and Go Test reinforcing increased velocity of movement, which increases spasticity and decreases quality of movement.   Time 4   Period Weeks   Status Deferred   PT SHORT TERM GOAL #6   Title Pt will ambulate x200' over level surfaces with SBQC and  supervision with no overt LOB. Target date: 03/08/15.   Baseline Achieved 03/07/15.   Time 4   Period Weeks   Status Achieved           PT Long Term Goals - 06/27/15 1326    PT LONG TERM GOAL #1   Title Pt will verbalize understanding of fall prevention within home environment. Target 02/10/15   Time 8   Period Weeks   Status Achieved   PT LONG TERM GOAL #2   Title Pt will improve TUG to less than or equal to 25 seconds for decreased fall risk.   Baseline Goal discontinued, as the TUG promotes increased velocity of movement, which increases spasticity and has negative impact on quality of movement.  7/12: 31.87 sec with SBQC   Time 8   Status Deferred  Discontinued; see above.   PT LONG TERM GOAL #3   Title Pt will improve gait velocity to at least 1 ft/sec for improved gait efficiency and safety.   Time 8   Period Weeks   Status Achieved   PT LONG TERM GOAL #4   Title Pt will ambulate at least 50-100 ft using hemiwalker, household distances, with supervision.   Time 8   Period Weeks   Status Achieved   PT LONG TERM GOAL #5   Title Pt will ambulate 150' over outdoor, unlevel surfaces with LRAD and supervision to indicate increased stability/independence with community mobility. Target date: 04/05/15.   Baseline Met 04/03/15 with pt using SBQC, requiring 25% cueing for safe gait velocity.   Time 8   Period Weeks   Status Achieved   PT LONG TERM GOAL #6   Title Pt will negotiate 4 stairs with single rail and supervision to increase pt independence with community mobility. Target date: 04/05/15.   Baseline Achieved 7/29.   Time 8   Period Weeks   Status Achieved   PT LONG TERM GOAL #7   Title Pt  will increase gait speed from 1.02 ft/sec to 1.62 ft/sec to indicate significant improvement in efficiency of ambulation. Modified Target date: 05/03/15   Baseline Deferred due to focus on PT being on quality as opposed to velocity of movement.   Time 8   Period Weeks   Status Deferred  8/3: 1.47 ft/sec with SBQC   PT LONG TERM GOAL #8   Title Pt will improve Berg score from 42 to 45/56 to indicate decreased fall risk. Modiifed Target date: 07/25/15   Baseline Revised from goal of 48 to 45/56 to indicate decreased fall rilsk.   Status Revised   PT LONG TERM GOAL  #9   TITLE Pt will improve score on Postural Assessment Scale for Stroke (PASS) from 30/36 to 33/36 to indicate significantly improved ability to maintain stability during positional change.  Target date: 05/31/15   Baseline Met 9/22.   Status Achieved   PT LONG TERM GOAL  #10   TITLE Pt will negotiate standard curb step and standard ramp with mod I using LRAD to indicate increased safety/independence traversing community obstalces.  Modiifed Target date: 07/25/15   Baseline 10/25: supervision, cueing required with RW and L hand orthosis   Status On-going   PT LONG TERM GOAL  #11   TITLE Pt will ambulate 300' over unlevel paved surfaces with mod I using LRAD to indicate increased stability with limited community mobility.  Modiifed Target date: 07/25/15   Baseline 1025: Mod I for linear gait, supervision/cueing with RW and L hand orthosis  Status On-going               Plan - 06/27/15 2142    Clinical Impression Statement Session focused on assessing/addressing current functional status since L tibial nerve block receieved approx 1 week ago. Due to pt being placed on hold for 7.5 weeks total due to L gastrocnemius tear (from 8/26 - 9/22) then L tibial nerve block (9/29 - 10/25), pt will benefit from skilled outpatient PT 2x/week for 5 additional weeks to address remaining 3 goals for Berg score and community mobility. LTG for  Berg score revised/downgraded to fall risk cutoff of 45/56. Pt/wife verbalized understanding and were in full agreement with POC.   Pt will benefit from skilled therapeutic intervention in order to improve on the following deficits Abnormal gait;Decreased activity tolerance;Decreased balance;Decreased mobility;Decreased range of motion;Decreased safety awareness;Difficulty walking;Decreased strength;Impaired tone;Decreased knowledge of use of DME;Decreased endurance;Decreased coordination;Impaired perceived functional ability;Postural dysfunction;Impaired sensation;Impaired UE functional use;Impaired vision/preception;Decreased cognition;Pain   Rehab Potential Good   PT Frequency 2x / week   PT Duration 4 weeks   PT Treatment/Interventions ADLs/Self Care Home Management;Therapeutic activities;Functional mobility training;Stair training;Gait training;Therapeutic exercise;Balance training;Neuromuscular re-education;Patient/family education;DME Instruction;Cognitive remediation;Orthotic Fit/Training;Manual techniques;Passive range of motion;Visual/perceptual remediation/compensation   PT Next Visit Plan LLE stretching, L NMR, task-specific training; increase symmetry of movement/LE weightbearing. Continue to address community mobility.   Consulted and Agree with Plan of Care Patient;Family member/caregiver   Family Member Consulted wife, [name]        Problem List Patient Active Problem List   Diagnosis Date Noted  . 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 Spastic hemiparesis affecting nondominant side ([place]) 11/08/2014  . Hypothyroidism 10/27/2014  . Left-sided neglect 09/23/2014  . Acute ischemic right MCA stroke ([place]) 09/16/2014  . Left hemiparesis ([place])   . CVA (cerebral vascular accident) ([place])   . Right carotid artery occlusion   . Acute right MCA stroke ([place])   . Essential hypertension 09/12/2014  . CVA (cerebral infarction) 09/12/2014  . Type 2 diabetes mellitus with  peripheral neuropathy ([employer]) 09/12/2014    Billie Ruddy, PT, DPT [facility] [address] [place], [place], [address] Phone: [phone]   Fax:  [phone] 06/27/2015, 9:54 PM      Name: Brian Hall MRN: [id number] Date of Birth: 1956/08/25

## 2015-06-29 ENCOUNTER — Ambulatory Visit: Payer: 59 | Admitting: Physical Therapy

## 2015-06-29 ENCOUNTER — Encounter: Payer: 59 | Admitting: Occupational Therapy

## 2015-06-29 DIAGNOSIS — R2681 Unsteadiness on feet: Secondary | ICD-10-CM

## 2015-06-29 DIAGNOSIS — G8112 Spastic hemiplegia affecting left dominant side: Secondary | ICD-10-CM | POA: Diagnosis not present

## 2015-06-29 DIAGNOSIS — G8194 Hemiplegia, unspecified affecting left nondominant side: Secondary | ICD-10-CM

## 2015-06-29 DIAGNOSIS — R269 Unspecified abnormalities of gait and mobility: Secondary | ICD-10-CM

## 2015-06-29 NOTE — Therapy (Signed)
[facility] [address] [place], [place], [address] Phone: [phone]   Fax:  [phone]  Physical Therapy Treatment  Patient Details  Name: Brian Hall MRN: [id number] Date of Birth: 1956/07/14 No Data Recorded  Encounter Date: 06/29/2015      PT End of Session - 06/29/15 2025    Visit Number 41   Number of Visits 49   Date for PT Re-Evaluation 08/11/15   PT Start Time 9833   PT Stop Time 1447   PT Time Calculation (min) 50 min   Activity Tolerance Patient tolerated treatment well   Behavior During Therapy [facility] for tasks assessed/performed      Past Medical History  Diagnosis Date  . Hypertension   . Diabetes mellitus   . Obesity   . CHF (congestive heart failure) ([place])   . SOB (shortness of breath)   . Hyperkalemia   . Renal insufficiency   . Cardiogenic shock ([facility])   . Cardiac LV ejection fraction 10-20%   . Hyperlipemia   . Hypothyroidism   . Throat cancer ([place]) 2011    s/p neck dissection, radiation, chemo  . Stroke ([place])   . Blurred vision   . Joint pain   . Joint swelling     Past Surgical History  Procedure Laterality Date  . Cardiac catheterization    . Neck dissection Right 2011    s/p resection of throat cancer with multiple nodes removed    There were no vitals filed for this visit.  Visit Diagnosis:  Abnormality of gait  Unsteadiness  Hemiplegia affecting left nondominant side [facility])      Subjective Assessment - 06/29/15 1421    Patient is accompained by: Family member   Pertinent History Light LLE stretching only. CVA with L hemiparesis 09/2014.   Limitations Standing;Walking;House hold activities   Patient Stated Goals Pt's goal is to get back to walking and using L hand again.  He has a motorcycle and would like to ride it again.    Currently in Pain? Yes   Pain Score 1    Pain Location Wrist   Pain Orientation Left   Pain Descriptors / Indicators Aching;Dull    Pain Type Chronic pain   Pain Onset More than a month ago   Pain Frequency Intermittent   Aggravating Factors  when hand orthosis on rolling walker turns   Pain Relieving Factors when wrist brace on   Multiple Pain Sites Yes   Pain Score 1   Pain Location Calf   Pain Orientation Left   Pain Descriptors / Indicators Sore   Pain Type Chronic pain   Pain Frequency Intermittent   Aggravating Factors  walking longer distances   Pain Relieving Factors rest   Pain Score 1   Pain Location Ankle   Pain Orientation Left   Pain Descriptors / Indicators Sore   Pain Type Chronic pain   Pain Onset More than a month ago   Pain Frequency Intermittent   Aggravating Factors  unsure   Pain Relieving Factors unsure                         [facility] Adult PT Treatment/Exercise - 06/29/15 0001    Transfers   Transfers Sit to Stand;Stand to Sit   Sit to Stand 6: Modified independent (Device/Increase time)   Sit to Stand Details (indicate cue type and reason) to RW   Stand to Sit 6: Modified independent (  Device/Increase time)   Ambulation/Gait   Ambulation/Gait Yes   Ambulation/Gait Assistance 5: Supervision   Ambulation/Gait Assistance Details Cueing focused on upright posture, forward gaze, addressing posterior rotation of trunk on L side (which appeared to decrease LLE circumduction during LLE advancement)   Ambulation Distance (Feet) 275 Feet  and x115'   Assistive device Rolling walker  bariatric RW with L hand orthosis   Gait Pattern Step-through pattern;Decreased stance time - left;Decreased hip/knee flexion - left;Decreased weight shift to left;Left foot flat;Trunk rotated posteriorly on left;Left circumduction   Ambulation Surface Level;Unlevel;Indoor;Outdoor;Paved   Self-Care   Self-Care Other Self-Care Comments   Other Self-Care Comments  Lowered RW height to promote LUE weightbearing (using L hand orthosis). Used rubber tubing to decrease movement of L hand orthosis to  maintain optimal LUE positioning/WB during ambulation.   Neuro Re-ed    Neuro Re-ed Details  For spasticity management, explained and demonstrated L gastrocnemius stretch in standing, WB with BUE support at RW (using L hand orthosis) 3 x1-minute holds with effective return demonstration when wife providing cueing to pt. Also performed performed gentle prolonged passive stretch of L gastroc (seated) 2 x1-min holds, then passive stretch of L PF and great toe flexor stretch 2 x1-min holds. Standing without shoe/AFO on, performed blocked practice of RLE advancement with BUE support at [employer] with multimodal cueing for postural alignment, anterior/lateral weight shift for spasticity management. Performed sit <> stand x14 reps (to pt fatigue) with 2" step under R foot to increase LLE weightbearing; tactile cueing at L knee for proprioceptive input, R ribcage for upright posture.                PT Education - 06/29/15 2014    Education provided Yes   Education Details Standing gastrocnemius stretch using RW.   Person(s) Educated Patient;Spouse   Methods Explanation;Demonstration;Tactile cues;Verbal cues   Comprehension Verbalized understanding;Returned demonstration          PT Short Term Goals - 04/11/15 1658    PT SHORT TERM GOAL #1   Title Pt will perform HEP with supervision of family, for improved strength and balance. (Target 01/10/15)   Time 4   Period Weeks   Status Achieved   PT SHORT TERM GOAL #2   Title Pt will improve Timed UP and Go score to less than or equal to 35 seconds for decreased fall risk.   Baseline Discontinued due to Timed up and Go Test reinforcing increased velocity of movement, which increases spasticity and decreases quality of movement.   Time 4   Period Weeks   Status Achieved   PT SHORT TERM GOAL #3   Title Berg Balance score to be assessed, with pt scoring at least 5 additional points to decrease fall risk.   Time 4   Period Weeks   Status Achieved   Scored 33 today   PT SHORT TERM GOAL #4   Title Pt will perform at least 6 of 10 reps of sit<>stand transfers no UE support, with equal weightbearing through bilateral lower extremities, independently.   Time 4   Period Weeks   Status Achieved   PT SHORT TERM GOAL #5   Title Pt will improve TUG time from 29.69 sec to 25 seconds for decreased fall risk. Target date: 03/08/15.   Baseline Discontinued due to Timed up and Go Test reinforcing increased velocity of movement, which increases spasticity and decreases quality of movement.   Time 4   Period Weeks   Status Deferred  PT SHORT TERM GOAL #6   Title Pt will ambulate x200' over level surfaces with [facility] and supervision with no overt LOB. Target date: 03/08/15.   Baseline Achieved 03/07/15.   Time 4   Period Weeks   Status Achieved           PT Long Term Goals - 06/27/15 1326    PT LONG TERM GOAL #1   Title Pt will verbalize understanding of fall prevention within home environment. Target 02/10/15   Time 8   Period Weeks   Status Achieved   PT LONG TERM GOAL #2   Title Pt will improve TUG to less than or equal to 25 seconds for decreased fall risk.   Baseline Goal discontinued, as the TUG promotes increased velocity of movement, which increases spasticity and has negative impact on quality of movement.  7/12: 31.87 sec with SBQC   Time 8   Status Deferred  Discontinued; see above.   PT LONG TERM GOAL #3   Title Pt will improve gait velocity to at least 1 ft/sec for improved gait efficiency and safety.   Time 8   Period Weeks   Status Achieved   PT LONG TERM GOAL #4   Title Pt will ambulate at least 50-100 ft using hemiwalker, household distances, with supervision.   Time 8   Period Weeks   Status Achieved   PT LONG TERM GOAL #5   Title Pt will ambulate 150' over outdoor, unlevel surfaces with LRAD and supervision to indicate increased stability/independence with community mobility. Target date: 04/05/15.   Baseline Met  04/03/15 with pt using SBQC, requiring 25% cueing for safe gait velocity.   Time 8   Period Weeks   Status Achieved   PT LONG TERM GOAL #6   Title Pt will negotiate 4 stairs with single rail and supervision to increase pt independence with community mobility. Target date: 04/05/15.   Baseline Achieved 7/29.   Time 8   Period Weeks   Status Achieved   PT LONG TERM GOAL #7   Title Pt will increase gait speed from 1.02 ft/sec to 1.62 ft/sec to indicate significant improvement in efficiency of ambulation. Modified Target date: 05/03/15   Baseline Deferred due to focus on PT being on quality as opposed to velocity of movement.   Time 8   Period Weeks   Status Deferred  8/3: 1.47 ft/sec with SBQC   PT LONG TERM GOAL #8   Title Pt will improve Berg score from 42 to 45/56 to indicate decreased fall risk. Modiifed Target date: 07/25/15   Baseline Revised from goal of 48 to 45/56 to indicate decreased fall rilsk.   Status Revised   PT LONG TERM GOAL  #9   TITLE Pt will improve score on Postural Assessment Scale for Stroke (PASS) from 30/36 to 33/36 to indicate significantly improved ability to maintain stability during positional change.  Target date: 05/31/15   Baseline Met 9/22.   Status Achieved   PT LONG TERM GOAL  #10   TITLE Pt will negotiate standard curb step and standard ramp with mod I using LRAD to indicate increased safety/independence traversing community obstalces.  Modiifed Target date: 07/25/15   Baseline 10/25: supervision, cueing required with RW and L hand orthosis   Status On-going   PT LONG TERM GOAL  #11   TITLE Pt will ambulate 300' over unlevel paved surfaces with mod I using LRAD to indicate increased stability with limited community mobility.  Modiifed Target date:  07/25/15   Baseline 1025: Mod I for linear gait, supervision/cueing with RW and L hand orthosis   Status On-going               Plan - 06/29/15 2026    Clinical Impression Statement Skilled session  focused on increasing symmetry of WB and facilitating normalized weight shifting during standing/gait to address spasticity and improving stability with mobility. Initiated home stretching program with focus on pt-directed stretch in WB to maximize pt safety.    Pt will benefit from skilled therapeutic intervention in order to improve on the following deficits Abnormal gait;Decreased activity tolerance;Decreased balance;Decreased mobility;Decreased range of motion;Decreased safety awareness;Difficulty walking;Decreased strength;Impaired tone;Decreased knowledge of use of DME;Decreased endurance;Decreased coordination;Impaired perceived functional ability;Postural dysfunction;Impaired sensation;Impaired UE functional use;Impaired vision/preception;Decreased cognition;Pain   Rehab Potential Good   PT Frequency 2x / week   PT Duration 4 weeks   PT Treatment/Interventions ADLs/Self Care Home Management;Therapeutic activities;Functional mobility training;Stair training;Gait training;Therapeutic exercise;Balance training;Neuromuscular re-education;Patient/family education;DME Instruction;Cognitive remediation;Orthotic Fit/Training;Manual techniques;Passive range of motion;Visual/perceptual remediation/compensation   PT Next Visit Plan Continue LLE stretching, L NMR, task-specific training; increase symmetry of movement/LE weightbearing. Continue to address community mobility.   Consulted and Agree with Plan of Care Patient;Family member/caregiver        Problem List Patient Active Problem List   Diagnosis Date Noted  . 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 Spastic hemiparesis affecting nondominant side ([place]) 11/08/2014  . Hypothyroidism 10/27/2014  . Left-sided neglect 09/23/2014  . Acute ischemic right MCA stroke ([place]) 09/16/2014  . Left hemiparesis ([place])   . CVA (cerebral vascular accident) ([place])   . Right carotid artery occlusion   . Acute right MCA stroke ([place])   . Essential  hypertension 09/12/2014  . CVA (cerebral infarction) 09/12/2014  . Type 2 diabetes mellitus with peripheral neuropathy ([place]) 09/12/2014   Billie Ruddy, PT, DPT [facility] [address] [place], [place], [address] Phone: [phone]   Fax:  [phone] 06/29/2015, 8:32 PM   Name: Brian Hall MRN: [id number] Date of Birth: April 05, 1956

## 2015-07-03 ENCOUNTER — Ambulatory Visit: Payer: 59 | Admitting: Physical Therapy

## 2015-07-03 DIAGNOSIS — G8194 Hemiplegia, unspecified affecting left nondominant side: Secondary | ICD-10-CM

## 2015-07-03 DIAGNOSIS — R2681 Unsteadiness on feet: Secondary | ICD-10-CM

## 2015-07-03 DIAGNOSIS — R269 Unspecified abnormalities of gait and mobility: Secondary | ICD-10-CM

## 2015-07-03 DIAGNOSIS — G8112 Spastic hemiplegia affecting left dominant side: Secondary | ICD-10-CM | POA: Diagnosis not present

## 2015-07-03 NOTE — Therapy (Signed)
[facility] [address] [place], [place], [address] Phone: [phone]   Fax:  [phone]  Physical Therapy Treatment  Patient Details  Name: Brian Hall MRN: [id number] Date of Birth: 09/01/1956 No Data Recorded  Encounter Date: 07/03/2015      PT End of Session - 07/03/15 1806    Visit Number 42   Number of Visits 49   Date for PT Re-Evaluation 08/11/15   PT Start Time 1318   PT Stop Time 1400   PT Time Calculation (min) 42 min   Activity Tolerance Patient tolerated treatment well   Behavior During Therapy [facility] for tasks assessed/performed      Past Medical History  Diagnosis Date  . Hypertension   . Diabetes mellitus   . Obesity   . CHF (congestive heart failure) ([place])   . SOB (shortness of breath)   . Hyperkalemia   . Renal insufficiency   . Cardiogenic shock ([place])   . Cardiac LV ejection fraction 10-20%   . Hyperlipemia   . Hypothyroidism   . Throat cancer ([place]) 2011    s/p neck dissection, radiation, chemo  . Stroke ([place])   . Blurred vision   . Joint pain   . Joint swelling     Past Surgical History  Procedure Laterality Date  . Cardiac catheterization    . Neck dissection Right 2011    s/p resection of throat cancer with multiple nodes removed    There were no vitals filed for this visit.  Visit Diagnosis:  Abnormality of gait  Unsteadiness  Hemiplegia affecting left nondominant side (HCC)      Subjective Assessment - 07/03/15 1322    Subjective For the first time, pt arrived to session ambulating with walker and wife did not bring w/c. Pt reports, "I walked all around a camp site by myself over the weekend."   Patient is accompained by: Family member   Pertinent History CVA with L hemiparesis 09/2014.   Limitations Standing;House hold activities   Patient Stated Goals Pt's goal is to get back to walking and using L hand again.  He has a motorcycle and would like to ride it  again.    Currently in Pain? Yes   Pain Score 1    Pain Location Wrist   Pain Orientation Left   Pain Descriptors / Indicators Aching;Dull   Pain Type Chronic pain   Pain Onset More than a month ago   Pain Frequency Intermittent   Pain Score 1   Pain Location Calf   Pain Orientation Left   Pain Descriptors / Indicators Sore   Pain Onset More than a month ago   Pain Frequency Intermittent   Pain Score 1   Pain Location Ankle   Pain Orientation Left   Pain Descriptors / Indicators Sore   Pain Type Chronic pain   Pain Onset More than a month ago   Pain Frequency Intermittent                         [facility] Adult PT Treatment/Exercise - 07/03/15 0001    Transfers   Transfers Sit to Stand;Stand to Sit   Sit to Stand 6: Modified independent (Device/Increase time)   Stand to Sit 6: Modified independent (Device/Increase time)   Ambulation/Gait   Ambulation/Gait Yes   Ambulation/Gait Assistance 5: Supervision;6: Modified independent (Device/Increase time)   Ambulation/Gait Assistance Details Gait x 292' over unlevel, paved surfaces with supervision  using RW and L hand orthosis with no overt LOB; gait x160', x110' over level, indoor surfaces with mod I; gait over gravel (as pt's driveway is gravel) 2 x7' and 180' turn with RW and supervision.   Ambulation Distance (Feet) 292 Feet  then x160' and 110'   Assistive device Rolling walker  bariatric RW with L hand orthosis   Gait Pattern Step-through pattern;Decreased stance time - left;Decreased hip/knee flexion - left;Decreased weight shift to left;Left foot flat;Trunk rotated posteriorly on left;Left circumduction   Ambulation Surface Level;Unlevel;Indoor;Outdoor;Paved;Gravel   Door Management 4: Min assist   Door Managment Details (indicate cue type and reason) using RW, L hand orthosis   Curb 5: Supervision;4: Min assist   Curb Details (indicate cue type and reason) Blocked practice of curb step negotiate with bariatric  RW and L hand orthosis. Most stability noted when pt ascended and descended with RLE. Cueing focused on stepping closed to edge of curb, staggering feet prior to lifting walker over edge of curb step. Pt exhibited effective within-session carryover of technique, requiring supervision to min guard for stability/balance.                PT Education - 07/03/15 1759    Education provided Yes   Education Details Sequencing, technique for curb step negotiation. Paced breathing during ambulation.   Person(s) Educated Patient;Spouse   Methods Explanation;Demonstration;Verbal cues   Comprehension Verbalized understanding;Returned demonstration          PT Short Term Goals - 04/11/15 1658    PT SHORT TERM GOAL #1   Title Pt will perform HEP with supervision of family, for improved strength and balance. (Target 01/10/15)   Time 4   Period Weeks   Status Achieved   PT SHORT TERM GOAL #2   Title Pt will improve Timed UP and Go score to less than or equal to 35 seconds for decreased fall risk.   Baseline Discontinued due to Timed up and Go Test reinforcing increased velocity of movement, which increases spasticity and decreases quality of movement.   Time 4   Period Weeks   Status Achieved   PT SHORT TERM GOAL #3   Title Berg Balance score to be assessed, with pt scoring at least 5 additional points to decrease fall risk.   Time 4   Period Weeks   Status Achieved  Scored 33 today   PT SHORT TERM GOAL #4   Title Pt will perform at least 6 of 10 reps of sit<>stand transfers no UE support, with equal weightbearing through bilateral lower extremities, independently.   Time 4   Period Weeks   Status Achieved   PT SHORT TERM GOAL #5   Title Pt will improve TUG time from 29.69 sec to 25 seconds for decreased fall risk. Target date: 03/08/15.   Baseline Discontinued due to Timed up and Go Test reinforcing increased velocity of movement, which increases spasticity and decreases quality of  movement.   Time 4   Period Weeks   Status Deferred   PT SHORT TERM GOAL #6   Title Pt will ambulate x200' over level surfaces with [facility] and supervision with no overt LOB. Target date: 03/08/15.   Baseline Achieved 03/07/15.   Time 4   Period Weeks   Status Achieved           PT Long Term Goals - 06/27/15 1326    PT LONG TERM GOAL #1   Title Pt will verbalize understanding of fall prevention within home  environment. Target 02/10/15   Time 8   Period Weeks   Status Achieved   PT LONG TERM GOAL #2   Title Pt will improve TUG to less than or equal to 25 seconds for decreased fall risk.   Baseline Goal discontinued, as the TUG promotes increased velocity of movement, which increases spasticity and has negative impact on quality of movement.  7/12: 31.87 sec with SBQC   Time 8   Status Deferred  Discontinued; see above.   PT LONG TERM GOAL #3   Title Pt will improve gait velocity to at least 1 ft/sec for improved gait efficiency and safety.   Time 8   Period Weeks   Status Achieved   PT LONG TERM GOAL #4   Title Pt will ambulate at least 50-100 ft using hemiwalker, household distances, with supervision.   Time 8   Period Weeks   Status Achieved   PT LONG TERM GOAL #5   Title Pt will ambulate 150' over outdoor, unlevel surfaces with LRAD and supervision to indicate increased stability/independence with community mobility. Target date: 04/05/15.   Baseline Met 04/03/15 with pt using SBQC, requiring 25% cueing for safe gait velocity.   Time 8   Period Weeks   Status Achieved   PT LONG TERM GOAL #6   Title Pt will negotiate 4 stairs with single rail and supervision to increase pt independence with community mobility. Target date: 04/05/15.   Baseline Achieved 7/29.   Time 8   Period Weeks   Status Achieved   PT LONG TERM GOAL #7   Title Pt will increase gait speed from 1.02 ft/sec to 1.62 ft/sec to indicate significant improvement in efficiency of ambulation. Modified Target date:  05/03/15   Baseline Deferred due to focus on PT being on quality as opposed to velocity of movement.   Time 8   Period Weeks   Status Deferred  8/3: 1.47 ft/sec with SBQC   PT LONG TERM GOAL #8   Title Pt will improve Berg score from 42 to 45/56 to indicate decreased fall risk. Modiifed Target date: 07/25/15   Baseline Revised from goal of 48 to 45/56 to indicate decreased fall rilsk.   Status Revised   PT LONG TERM GOAL  #9   TITLE Pt will improve score on Postural Assessment Scale for Stroke (PASS) from 30/36 to 33/36 to indicate significantly improved ability to maintain stability during positional change.  Target date: 05/31/15   Baseline Met 9/22.   Status Achieved   PT LONG TERM GOAL  #10   TITLE Pt will negotiate standard curb step and standard ramp with mod I using LRAD to indicate increased safety/independence traversing community obstalces.  Modiifed Target date: 07/25/15   Baseline 10/25: supervision, cueing required with RW and L hand orthosis   Status On-going   PT LONG TERM GOAL  #11   TITLE Pt will ambulate 300' over unlevel paved surfaces with mod I using LRAD to indicate increased stability with limited community mobility.  Modiifed Target date: 07/25/15   Baseline 1025: Mod I for linear gait, supervision/cueing with RW and L hand orthosis   Status On-going               Plan - 07/03/15 1806    Clinical Impression Statement Session focused on increasing pt stability/independence with community mobility and increasing walking tolerance by promoting pt use of paced breathing during gait training. Of note, pt did arrive to today's session ambulating with RW, L  hand orthosis without w/c for the first time since beginning PT. Pt progressing toward LTG's for community ambulation and obstacle negotiation.   Pt will benefit from skilled therapeutic intervention in order to improve on the following deficits Abnormal gait;Decreased activity tolerance;Decreased balance;Decreased  mobility;Decreased range of motion;Decreased safety awareness;Difficulty walking;Decreased strength;Impaired tone;Decreased knowledge of use of DME;Decreased endurance;Decreased coordination;Impaired perceived functional ability;Postural dysfunction;Impaired sensation;Impaired UE functional use;Impaired vision/preception;Decreased cognition;Pain   Rehab Potential Good   PT Frequency 2x / week   PT Duration 4 weeks   PT Treatment/Interventions ADLs/Self Care Home Management;Therapeutic activities;Functional mobility training;Stair training;Gait training;Therapeutic exercise;Balance training;Neuromuscular re-education;Patient/family education;DME Instruction;Cognitive remediation;Orthotic Fit/Training;Manual techniques;Passive range of motion;Visual/perceptual remediation/compensation   PT Next Visit Plan Community mobility, paced breathing during gait. Continue LLE stretching, L NMR, task-specific training; increase symmetry of movement/LE weightbearing.   Consulted and Agree with Plan of Care Patient;Family member/caregiver   Family Member Consulted wife, [name]        Problem List Patient Active Problem List   Diagnosis Date Noted  . 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 Spastic hemiparesis affecting nondominant side ([place]) 11/08/2014  . Hypothyroidism 10/27/2014  . Left-sided neglect 09/23/2014  . Acute ischemic right MCA stroke ([place]) 09/16/2014  . Left hemiparesis ([place])   . CVA (cerebral vascular accident) ([place])   . Right carotid artery occlusion   . Acute right MCA stroke ([place])   . Essential hypertension 09/12/2014  . CVA (cerebral infarction) 09/12/2014  . Type 2 diabetes mellitus with peripheral neuropathy ([place]) 09/12/2014   Billie Ruddy, PT, DPT [facility] [address] [place], [place], [address] Phone: [phone]   Fax:  [phone] 07/03/2015, 6:11 PM   Name: Brian Hall MRN: [id number] Date of Birth:  [birth year]

## 2015-07-05 ENCOUNTER — Ambulatory Visit: Payer: 59 | Attending: Physical Medicine & Rehabilitation | Admitting: Physical Therapy

## 2015-07-05 DIAGNOSIS — R2681 Unsteadiness on feet: Secondary | ICD-10-CM | POA: Diagnosis present

## 2015-07-05 DIAGNOSIS — R269 Unspecified abnormalities of gait and mobility: Secondary | ICD-10-CM

## 2015-07-05 DIAGNOSIS — G8112 Spastic hemiplegia affecting left dominant side: Secondary | ICD-10-CM | POA: Diagnosis present

## 2015-07-05 DIAGNOSIS — G8114 Spastic hemiplegia affecting left nondominant side: Secondary | ICD-10-CM

## 2015-07-05 NOTE — Therapy (Signed)
[facility] [address] [place], [place], [address] Phone: [phone]   Fax:  [phone]  Physical Therapy Treatment  Patient Details  Name: Brian Hall MRN: [id number] Date of Birth: 1955-11-29 No Data Recorded  Encounter Date: 07/05/2015      PT End of Session - 07/05/15 2151    Visit Number 43   Number of Visits 49   Date for PT Re-Evaluation 08/11/15   PT Start Time 1318   PT Stop Time 1405   PT Time Calculation (min) 47 min   Activity Tolerance Patient tolerated treatment well   Behavior During Therapy [facility] for tasks assessed/performed      Past Medical History  Diagnosis Date  . Hypertension   . Diabetes mellitus   . Obesity   . CHF (congestive heart failure) ([place])   . SOB (shortness of breath)   . Hyperkalemia   . Renal insufficiency   . Cardiogenic shock ([place])   . Cardiac LV ejection fraction 10-20%   . Hyperlipemia   . Hypothyroidism   . Throat cancer ([place]) 2011    s/p neck dissection, radiation, chemo  . Stroke ([place])   . Blurred vision   . Joint pain   . Joint swelling     Past Surgical History  Procedure Laterality Date  . Cardiac catheterization    . Neck dissection Right 2011    s/p resection of throat cancer with multiple nodes removed    There were no vitals filed for this visit.  Visit Diagnosis:  Abnormality of gait  Unsteadiness  Left spastic hemiplegia (HCC)      Subjective Assessment - 07/05/15 1323    Subjective Pt denies falls and reports no significant changes.    Patient is accompained by: Family member   Pertinent History CVA with L hemiparesis 09/2014.   Limitations Standing;House hold activities   Patient Stated Goals Pt's goal is to get back to walking and using L hand again.  He has a motorcycle and would like to ride it again.    Currently in Pain? Yes   Pain Score 1    Pain Location Wrist   Pain Descriptors / Indicators Aching;Dull   Pain Type  Chronic pain   Pain Onset More than a month ago   Pain Frequency Intermittent   Aggravating Factors  when hand orthosis turns sideways   Pain Relieving Factors when wrist brace is on   Multiple Pain Sites Yes   Pain Score 1   Pain Location Calf   Pain Orientation Left   Pain Descriptors / Indicators Sore   Pain Type Chronic pain   Pain Onset More than a month ago   Pain Frequency Intermittent   Aggravating Factors  walking   Pain Relieving Factors resting   Pain Score 1   Pain Location Ankle   Pain Orientation Left   Pain Descriptors / Indicators Sore   Pain Type Chronic pain   Pain Onset More than a month ago   Pain Frequency Intermittent   Aggravating Factors  "sitting around"   Pain Relieving Factors walking                         [facility] Adult PT Treatment/Exercise - 07/05/15 0001    Transfers   Transfers Sit to Stand;Stand to Sit   Sit to Stand 6: Modified independent (Device/Increase time)   Stand to Sit 6: Modified independent (Device/Increase time)   Ambulation/Gait  Ambulation/Gait Yes   Ambulation/Gait Assistance 5: Supervision;6: Modified independent (Device/Increase time)   Ambulation Distance (Feet) 120 Feet  x2   Assistive device Rolling walker  bariatric RW with L hand orthosis   Gait Pattern Step-through pattern;Decreased stance time - left;Decreased hip/knee flexion - left;Decreased weight shift to left;Left foot flat;Trunk rotated posteriorly on left;Left circumduction   Ambulation Surface Level;Indoor   Neuro Re-ed    Neuro Re-ed Details  To decrease LLE extensor synergy, increase selective control in LLE, pt performed the following: supine L hip flexor self-stretch (iliopsoas and rectus femoris 2 x60-sec holds each); supine with RLE on rolling chair (to decrease overuse/compensation with RLE), performed modified LLE bridging with LLE off EOB on 2" step 2 x10 reps with cueing for technique. Performed supine LLE PNF D2 flexion/extension x15  reps rhythmic initiation, x15 reps slow reversal hold during D2 flexion; during extension, emphasized L hip ABD. Performed static R half kneeling with BUE support 2 x30-sec holds, without UE support x5 trials to pt fatigue (up to 20 seconds duration prior to LOB). Performed R half kneeling <> tall kneeling with mod A; and tall kneeling without UE support x45 second prior to onset of "cramping" in L hip (which resolved immediately upon repositioning into seated). Pre-session, pt unable to perform alternating toe-taps on 4" step without UE support due to LLE extensor Tone. Post-session, pt able to perform 5 consecutive toe-taps with min guard and no overt LOB.                  PT Short Term Goals - 04/11/15 1658    PT SHORT TERM GOAL #1   Title Pt will perform HEP with supervision of family, for improved strength and balance. (Target 01/10/15)   Time 4   Period Weeks   Status Achieved   PT SHORT TERM GOAL #2   Title Pt will improve Timed UP and Go score to less than or equal to 35 seconds for decreased fall risk.   Baseline Discontinued due to Timed up and Go Test reinforcing increased velocity of movement, which increases spasticity and decreases quality of movement.   Time 4   Period Weeks   Status Achieved   PT SHORT TERM GOAL #3   Title Berg Balance score to be assessed, with pt scoring at least 5 additional points to decrease fall risk.   Time 4   Period Weeks   Status Achieved  Scored 33 today   PT SHORT TERM GOAL #4   Title Pt will perform at least 6 of 10 reps of sit<>stand transfers no UE support, with equal weightbearing through bilateral lower extremities, independently.   Time 4   Period Weeks   Status Achieved   PT SHORT TERM GOAL #5   Title Pt will improve TUG time from 29.69 sec to 25 seconds for decreased fall risk. Target date: 03/08/15.   Baseline Discontinued due to Timed up and Go Test reinforcing increased velocity of movement, which increases spasticity and  decreases quality of movement.   Time 4   Period Weeks   Status Deferred   PT SHORT TERM GOAL #6   Title Pt will ambulate x200' over level surfaces with [facility] and supervision with no overt LOB. Target date: 03/08/15.   Baseline Achieved 03/07/15.   Time 4   Period Weeks   Status Achieved           PT Long Term Goals - 06/27/15 1326    PT LONG TERM GOAL #  1   Title Pt will verbalize understanding of fall prevention within home environment. Target 02/10/15   Time 8   Period Weeks   Status Achieved   PT LONG TERM GOAL #2   Title Pt will improve TUG to less than or equal to 25 seconds for decreased fall risk.   Baseline Goal discontinued, as the TUG promotes increased velocity of movement, which increases spasticity and has negative impact on quality of movement.  7/12: 31.87 sec with SBQC   Time 8   Status Deferred  Discontinued; see above.   PT LONG TERM GOAL #3   Title Pt will improve gait velocity to at least 1 ft/sec for improved gait efficiency and safety.   Time 8   Period Weeks   Status Achieved   PT LONG TERM GOAL #4   Title Pt will ambulate at least 50-100 ft using hemiwalker, household distances, with supervision.   Time 8   Period Weeks   Status Achieved   PT LONG TERM GOAL #5   Title Pt will ambulate 150' over outdoor, unlevel surfaces with LRAD and supervision to indicate increased stability/independence with community mobility. Target date: 04/05/15.   Baseline Met 04/03/15 with pt using SBQC, requiring 25% cueing for safe gait velocity.   Time 8   Period Weeks   Status Achieved   PT LONG TERM GOAL #6   Title Pt will negotiate 4 stairs with single rail and supervision to increase pt independence with community mobility. Target date: 04/05/15.   Baseline Achieved 7/29.   Time 8   Period Weeks   Status Achieved   PT LONG TERM GOAL #7   Title Pt will increase gait speed from 1.02 ft/sec to 1.62 ft/sec to indicate significant improvement in efficiency of ambulation.  Modified Target date: 05/03/15   Baseline Deferred due to focus on PT being on quality as opposed to velocity of movement.   Time 8   Period Weeks   Status Deferred  8/3: 1.47 ft/sec with SBQC   PT LONG TERM GOAL #8   Title Pt will improve Berg score from 42 to 45/56 to indicate decreased fall risk. Modiifed Target date: 07/25/15   Baseline Revised from goal of 48 to 45/56 to indicate decreased fall rilsk.   Status Revised   PT LONG TERM GOAL  #9   TITLE Pt will improve score on Postural Assessment Scale for Stroke (PASS) from 30/36 to 33/36 to indicate significantly improved ability to maintain stability during positional change.  Target date: 05/31/15   Baseline Met 9/22.   Status Achieved   PT LONG TERM GOAL  #10   TITLE Pt will negotiate standard curb step and standard ramp with mod I using LRAD to indicate increased safety/independence traversing community obstalces.  Modiifed Target date: 07/25/15   Baseline 10/25: supervision, cueing required with RW and L hand orthosis   Status On-going   PT LONG TERM GOAL  #11   TITLE Pt will ambulate 300' over unlevel paved surfaces with mod I using LRAD to indicate increased stability with limited community mobility.  Modiifed Target date: 07/25/15   Baseline 1025: Mod I for linear gait, supervision/cueing with RW and L hand orthosis   Status On-going               Plan - 07/05/15 2151    Clinical Impression Statement Session focused on decreasing LLE synergistic movement to increase selective control (focus on L hip/knee flexion, hip ABD) to improve stability/independence with  functional mobility.    Pt will benefit from skilled therapeutic intervention in order to improve on the following deficits Abnormal gait;Decreased activity tolerance;Decreased balance;Decreased mobility;Decreased range of motion;Decreased safety awareness;Difficulty walking;Decreased strength;Impaired tone;Decreased knowledge of use of DME;Decreased  endurance;Decreased coordination;Impaired perceived functional ability;Postural dysfunction;Impaired sensation;Impaired UE functional use;Impaired vision/preception;Decreased cognition;Pain   Rehab Potential Good   PT Frequency 2x / week   PT Duration 4 weeks   PT Treatment/Interventions ADLs/Self Care Home Management;Therapeutic activities;Functional mobility training;Stair training;Gait training;Therapeutic exercise;Balance training;Neuromuscular re-education;Patient/family education;DME Instruction;Cognitive remediation;Orthotic Fit/Training;Manual techniques;Passive range of motion;Visual/perceptual remediation/compensation   PT Next Visit Plan Continue LLE stretching, L NMR, task-specific training; increase symmetry of movement/LE weightbearing. Community mobility.   Consulted and Agree with Plan of Care Patient;Family member/caregiver   Family Member Consulted wife, [name]        Problem List Patient Active Problem List   Diagnosis Date Noted  . 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 Spastic hemiparesis affecting nondominant side ([place]) 11/08/2014  . Hypothyroidism 10/27/2014  . Left-sided neglect 09/23/2014  . Acute ischemic right MCA stroke ([place]) 09/16/2014  . Left hemiparesis ([place])   . CVA (cerebral vascular accident) ([place])   . Right carotid artery occlusion   . Acute right MCA stroke ([place])   . Essential hypertension 09/12/2014  . CVA (cerebral infarction) 09/12/2014  . Type 2 diabetes mellitus with peripheral neuropathy ([place]) 09/12/2014   Billie Ruddy, PT, DPT [facility] [address] [place] [address], [place], [address] Phone: [phone]   Fax:  [phone] 07/05/2015, 9:54 PM   Name: Brian Hall MRN: [id number] Date of Birth: 04/01/1956

## 2015-07-06 ENCOUNTER — Other Ambulatory Visit: Payer: Self-pay | Admitting: Family Medicine

## 2015-07-10 ENCOUNTER — Ambulatory Visit: Payer: 59 | Admitting: Physical Therapy

## 2015-07-10 DIAGNOSIS — R269 Unspecified abnormalities of gait and mobility: Secondary | ICD-10-CM | POA: Diagnosis not present

## 2015-07-10 DIAGNOSIS — G8114 Spastic hemiplegia affecting left nondominant side: Secondary | ICD-10-CM

## 2015-07-10 DIAGNOSIS — R2681 Unsteadiness on feet: Secondary | ICD-10-CM

## 2015-07-10 NOTE — Therapy (Signed)
[facility] [address] [place], [place], [address] Phone: [phone]   Fax:  [phone]  Physical Therapy Treatment  Patient Details  Name: Brian Hall MRN: [id number] Date of Birth: 1955-12-15 No Data Recorded  Encounter Date: 07/10/2015      PT End of Session - 07/10/15 2013    Visit Number 44   Number of Visits 49   Date for PT Re-Evaluation 08/11/15   PT Start Time 1315   PT Stop Time 1359   PT Time Calculation (min) 44 min   Activity Tolerance Patient tolerated treatment well   Behavior During Therapy [facility] for tasks assessed/performed      Past Medical History  Diagnosis Date  . Hypertension   . Diabetes mellitus   . Obesity   . CHF (congestive heart failure) ([place])   . SOB (shortness of breath)   . Hyperkalemia   . Renal insufficiency   . Cardiogenic shock ([place])   . Cardiac LV ejection fraction 10-20%   . Hyperlipemia   . Hypothyroidism   . Throat cancer ([place]) 2011    s/p neck dissection, radiation, chemo  . Stroke ([place])   . Blurred vision   . Joint pain   . Joint swelling     Past Surgical History  Procedure Laterality Date  . Cardiac catheterization    . Neck dissection Right 2011    s/p resection of throat cancer with multiple nodes removed    There were no vitals filed for this visit.  Visit Diagnosis:  Abnormality of gait  Unsteadiness  Left spastic hemiplegia [facility])      Subjective Assessment - 07/10/15 1334    Patient is accompained by: Family member   Pertinent History CVA with L hemiparesis 09/2014.   Limitations Standing;House hold activities   Currently in Pain? Yes   Pain Score 1    Pain Location Calf   Pain Orientation Left   Pain Descriptors / Indicators Sore   Pain Type Chronic pain   Pain Onset More than a month ago   Pain Frequency Intermittent   Aggravating Factors  walking   Pain Relieving Factors resting   Multiple Pain Sites Yes   Pain Score 1    Pain Location Ankle   Pain Orientation Left   Pain Descriptors / Indicators Sore   Pain Type Chronic pain   Pain Onset More than a month ago   Pain Frequency Intermittent   Aggravating Factors  prolonged sitting   Pain Relieving Factors walking                         [facility] Adult PT Treatment/Exercise - 07/10/15 0001    Transfers   Transfers Sit to Stand;Stand to Sit   Sit to Stand 6: Modified independent (Device/Increase time)   Stand to Sit 6: Modified independent (Device/Increase time)   Ambulation/Gait   Ambulation/Gait Yes   Ambulation/Gait Assistance 6: Modified independent (Device/Increase time)   Ambulation Distance (Feet) 600 Feet   Assistive device Rolling walker  L hand orthosis   Gait Pattern Step-through pattern;Decreased stance time - left;Decreased hip/knee flexion - left;Decreased weight shift to left;Left foot flat;Trunk rotated posteriorly on left;Left circumduction   Ambulation Surface Level;Unlevel;Indoor;Outdoor;Paved   Curb 5: Supervision   Curb Details (indicate cue type and reason) using SBQC; no cueing required for sequencing; supervision required only for stability/balance (for safe positioning during descent; and due to LLE extensor tone causing adduction during  descent).   Gait Comments During gait x600' over outdoor, paved surfaces with mod I, noted pt using paced breathing throughout gait trial.   Neuro Re-ed    Neuro Re-ed Details  To address LLE extensor synergy, increase selective control in LLE, pt performed the following: explained, demonstrated supine, B bent knee fall outs x10-sec holds; explained and demonstrated caregiver-assisted L hip adductor self-stretch with L knee extended 2 x60-sec holds; supine L hip flexor self-stretch (iliopsoas and rectus femoris 2 x60-sec holds each); supine with RLE on rolling chair (to decrease overuse/compensation with RLE), performed modified LLE bridging with LLE off EOB on 2" step 2 x10 reps with  cueing for technique.                  PT Short Term Goals - 04/11/15 1658    PT SHORT TERM GOAL #1   Title Pt will perform HEP with supervision of family, for improved strength and balance. (Target 01/10/15)   Time 4   Period Weeks   Status Achieved   PT SHORT TERM GOAL #2   Title Pt will improve Timed UP and Go score to less than or equal to 35 seconds for decreased fall risk.   Baseline Discontinued due to Timed up and Go Test reinforcing increased velocity of movement, which increases spasticity and decreases quality of movement.   Time 4   Period Weeks   Status Achieved   PT SHORT TERM GOAL #3   Title Berg Balance score to be assessed, with pt scoring at least 5 additional points to decrease fall risk.   Time 4   Period Weeks   Status Achieved  Scored 33 today   PT SHORT TERM GOAL #4   Title Pt will perform at least 6 of 10 reps of sit<>stand transfers no UE support, with equal weightbearing through bilateral lower extremities, independently.   Time 4   Period Weeks   Status Achieved   PT SHORT TERM GOAL #5   Title Pt will improve TUG time from 29.69 sec to 25 seconds for decreased fall risk. Target date: 03/08/15.   Baseline Discontinued due to Timed up and Go Test reinforcing increased velocity of movement, which increases spasticity and decreases quality of movement.   Time 4   Period Weeks   Status Deferred   PT SHORT TERM GOAL #6   Title Pt will ambulate x200' over level surfaces with [facility] and supervision with no overt LOB. Target date: 03/08/15.   Baseline Achieved 03/07/15.   Time 4   Period Weeks   Status Achieved           PT Long Term Goals - 07/10/15 1336    PT LONG TERM GOAL #1   Title Pt will verbalize understanding of fall prevention within home environment. Target 02/10/15   Time 8   Period Weeks   Status Achieved   PT LONG TERM GOAL #2   Title Pt will improve TUG to less than or equal to 25 seconds for decreased fall risk.   Baseline Goal  discontinued, as the TUG promotes increased velocity of movement, which increases spasticity and has negative impact on quality of movement.  7/12: 31.87 sec with SBQC   Time 8   Status Deferred  Discontinued; see above.   PT LONG TERM GOAL #3   Title Pt will improve gait velocity to at least 1 ft/sec for improved gait efficiency and safety.   Time 8   Period Weeks   Status  Achieved   PT LONG TERM GOAL #4   Title Pt will ambulate at least 50-100 ft using hemiwalker, household distances, with supervision.   Time 8   Period Weeks   Status Achieved   PT LONG TERM GOAL #5   Title Pt will ambulate 150' over outdoor, unlevel surfaces with LRAD and supervision to indicate increased stability/independence with community mobility. Target date: 04/05/15.   Baseline Met 04/03/15 with pt using SBQC, requiring 25% cueing for safe gait velocity.   Time 8   Period Weeks   Status Achieved   PT LONG TERM GOAL #6   Title Pt will negotiate 4 stairs with single rail and supervision to increase pt independence with community mobility. Target date: 04/05/15.   Baseline Achieved 7/29.   Time 8   Period Weeks   Status Achieved   PT LONG TERM GOAL #7   Title Pt will increase gait speed from 1.02 ft/sec to 1.62 ft/sec to indicate significant improvement in efficiency of ambulation. Modified Target date: 05/03/15   Baseline Deferred due to focus on PT being on quality as opposed to velocity of movement.   Time 8   Period Weeks   Status Deferred  8/3: 1.47 ft/sec with SBQC   PT LONG TERM GOAL #8   Title Pt will improve Berg score from 42 to 45/56 to indicate decreased fall risk. Modiifed Target date: 07/25/15   Baseline Revised from goal of 48 to 45/56 to indicate decreased fall rilsk.   Status On-going   PT LONG TERM GOAL  #9   TITLE Pt will improve score on Postural Assessment Scale for Stroke (PASS) from 30/36 to 33/36 to indicate significantly improved ability to maintain stability during positional change.   Target date: 05/31/15   Baseline Met 9/22.   Status Achieved   PT LONG TERM GOAL  #10   TITLE Pt will negotiate standard curb step and standard ramp with mod I using LRAD to indicate increased safety/independence traversing community obstalces.  Modiifed Target date: 07/25/15   Baseline 10/25: supervision, cueing required with RW and L hand orthosis   Status On-going   PT LONG TERM GOAL  #11   TITLE Pt will ambulate 300' over unlevel paved surfaces with mod I using LRAD to indicate increased stability with limited community mobility.  Modiifed Target date: 07/25/15   Baseline Met 11/7, as pt ambulated x600' over unlevel, paved surfaces with mod I using RW and L hand orthosis.   Status Achieved               Plan - 07/10/15 2014    Clinical Impression Statement LTG met for community ambulation surpassed, as pt ambulated x600' consecutively with mod I using RW, L hand orthosis, and L AFO. Pt demonstrated use of paced breathing technique pt/wife were educated on last session. Pt will continue to benefit from skilled outpatient PT to address abnormal LLE tone, improve functional standing balance, and promote safe negotiation of community obstacles.    Pt will benefit from skilled therapeutic intervention in order to improve on the following deficits Abnormal gait;Decreased activity tolerance;Decreased balance;Decreased mobility;Decreased range of motion;Decreased safety awareness;Difficulty walking;Decreased strength;Impaired tone;Decreased knowledge of use of DME;Decreased endurance;Decreased coordination;Impaired perceived functional ability;Postural dysfunction;Impaired sensation;Impaired UE functional use;Impaired vision/preception;Decreased cognition;Pain   Rehab Potential Good   PT Frequency 2x / week   PT Duration 4 weeks   PT Treatment/Interventions ADLs/Self Care Home Management;Therapeutic activities;Functional mobility training;Stair training;Gait training;Therapeutic  exercise;Balance training;Neuromuscular re-education;Patient/family education;DME Instruction;Cognitive remediation;Orthotic Fit/Training;Manual techniques;Passive  range of motion;Visual/perceptual remediation/compensation   PT Next Visit Plan Continue LLE stretching, L NMR, task-specific training, and increase symmetry of movement/LE weightbearing to address remaining LTG's   Consulted and Agree with Plan of Care Patient;Family member/caregiver   Family Member Consulted wife, [name]        Problem List Patient Active Problem List   Diagnosis Date Noted  . 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 Spastic hemiparesis affecting nondominant side ([place]) 11/08/2014  . Hypothyroidism 10/27/2014  . Left-sided neglect 09/23/2014  . Acute ischemic right MCA stroke ([place]) 09/16/2014  . Left hemiparesis ([place])   . CVA (cerebral vascular accident) ([place])   . Right carotid artery occlusion   . Acute right MCA stroke ([place])   . Essential hypertension 09/12/2014  . CVA (cerebral infarction) 09/12/2014  . Type 2 diabetes mellitus with peripheral neuropathy ([place]) 09/12/2014    Billie Ruddy, PT, DPT [facility] [address] [place], [place], [address] Phone: [phone]   Fax:  [phone] 07/10/2015, 8:19 PM   Name: Brian Hall MRN: [id number] Date of Birth: 1956-04-30

## 2015-07-10 NOTE — Patient Instructions (Signed)
CAREGIVER ASSISTED: Adductors    Caregiver moves leg out to side, keeping toes pointed up. Hold _30__ seconds. _4__ reps per set, _2_ sets per day.

## 2015-07-12 ENCOUNTER — Ambulatory Visit: Payer: 59 | Admitting: Physical Therapy

## 2015-07-12 DIAGNOSIS — R2681 Unsteadiness on feet: Secondary | ICD-10-CM

## 2015-07-12 DIAGNOSIS — R269 Unspecified abnormalities of gait and mobility: Secondary | ICD-10-CM

## 2015-07-12 DIAGNOSIS — G8114 Spastic hemiplegia affecting left nondominant side: Secondary | ICD-10-CM

## 2015-07-12 NOTE — Therapy (Signed)
[facility] [address] [place], [place], [address] Phone: [phone]   Fax:  [phone]  Physical Therapy Treatment  Patient Details  Name: Brian Hall MRN: [id number] Date of Birth: 1955/11/07 No Data Recorded  Encounter Date: 07/12/2015      PT End of Session - 07/12/15 1940    Visit Number 45   Number of Visits 49   Date for PT Re-Evaluation 08/11/15   PT Start Time 1319   PT Stop Time 1401   PT Time Calculation (min) 42 min   Activity Tolerance Patient limited by pain  pain in area of L piriformis   Behavior During Therapy [facility] for tasks assessed/performed      Past Medical History  Diagnosis Date  . Hypertension   . Diabetes mellitus   . Obesity   . CHF (congestive heart failure) ([place])   . SOB (shortness of breath)   . Hyperkalemia   . Renal insufficiency   . Cardiogenic shock ([place])   . Cardiac LV ejection fraction 10-20%   . Hyperlipemia   . Hypothyroidism   . Throat cancer ([place]) 2011    s/p neck dissection, radiation, chemo  . Stroke ([place])   . Blurred vision   . Joint pain   . Joint swelling     Past Surgical History  Procedure Laterality Date  . Cardiac catheterization    . Neck dissection Right 2011    s/p resection of throat cancer with multiple nodes removed    There were no vitals filed for this visit.  Visit Diagnosis:  Abnormality of gait  Unsteadiness  Left spastic hemiplegia ([facility])      Subjective Assessment - 07/12/15 1325    Patient is accompained by: Family member   Pertinent History CVA with L hemiparesis 09/2014.   Limitations Standing;House hold activities   Patient Stated Goals Pt's goal is to get back to walking and using L hand again.  He has a motorcycle and would like to ride it again.    Currently in Pain? Yes   Pain Score 1    Pain Location Calf   Pain Orientation Left   Pain Descriptors / Indicators Sore   Pain Type Chronic pain   Pain Onset  More than a month ago   Pain Frequency Intermittent   Aggravating Factors  walking   Pain Relieving Factors resting   Multiple Pain Sites Yes   Pain Score 1   Pain Location Ankle   Pain Orientation Left   Pain Descriptors / Indicators Sore   Pain Type Chronic pain   Pain Onset More than a month ago   Pain Frequency Intermittent   Aggravating Factors  prolonged sitting   Pain Relieving Factors walking   Pain Score 1   Pain Location Ankle   Pain Orientation Left   Pain Descriptors / Indicators Sore   Pain Type Chronic pain   Pain Onset More than a month ago   Pain Frequency Intermittent   Aggravating Factors  "sitting around"   Pain Relieving Factors walking                         [facility] Adult PT Treatment/Exercise - 07/12/15 0001    Transfers   Transfers Stand to Sit   Sit to Stand 6: Modified independent (Device/Increase time)   Stand to Sit 6: Modified independent (Device/Increase time)   Ambulation/Gait   Ambulation/Gait Yes   Ambulation/Gait Assistance 6:  Modified independent (Device/Increase time);4: Min guard;4: Min assist   Ambulation/Gait Assistance Details 2 x130' with RW and L hand orthosis; x80' without AD with min guard-min A.   Ambulation Distance (Feet) 340 Feet   Assistive device Rolling walker;None  L hand orthosis   Gait Pattern Step-through pattern;Decreased stance time - left;Decreased hip/knee flexion - left;Decreased weight shift to left;Left foot flat;Trunk rotated posteriorly on left;Left circumduction   Ambulation Surface Level;Indoor   Gait Comments Gait training x80' without AD for increased postural stability/control; cueing focused on upright posture, wider BOS (due to LLE adduction), and increased LE WB during turning 180 degrees.   Neuro Re-ed    Neuro Re-ed Details  To decrease extensor tone in LLE, pt performed transfer from seated on mat table <> R side sitting on floor. Transition from tall kneeling <> half kneeling <>  quadruped (with manual assist/stabilization at L hand to ensure safe positioning) to increase proprioceptive input,core/proximal stability. Attempted static tall kneeling without UE support (cueing for equal LE WB); however, unable to perform for > 4 minutes due to onset of pain in L buttock (area of L piriformis).   Exercises   Exercises Other Exercises   Other Exercises  With explanation and demo from PT, pt effectively performed L piriformis self-stretch in seated 4 x30-sec holds. Also performed manual L pirifromis stretch in supine 2 x 60-sec holds.                PT Education - 07/12/15 1928    Education provided Yes   Education Details L Piriformis stretches: seated hip external rotation and supine caregiver-assisted L hip internal rotation.   Person(s) Educated Patient;Spouse   Methods Demonstration;Explanation;Handout   Comprehension Verbalized understanding;Returned demonstration          PT Short Term Goals - 04/11/15 1658    PT SHORT TERM GOAL #1   Title Pt will perform HEP with supervision of family, for improved strength and balance. (Target 01/10/15)   Time 4   Period Weeks   Status Achieved   PT SHORT TERM GOAL #2   Title Pt will improve Timed UP and Go score to less than or equal to 35 seconds for decreased fall risk.   Baseline Discontinued due to Timed up and Go Test reinforcing increased velocity of movement, which increases spasticity and decreases quality of movement.   Time 4   Period Weeks   Status Achieved   PT SHORT TERM GOAL #3   Title Berg Balance score to be assessed, with pt scoring at least 5 additional points to decrease fall risk.   Time 4   Period Weeks   Status Achieved  Scored 33 today   PT SHORT TERM GOAL #4   Title Pt will perform at least 6 of 10 reps of sit<>stand transfers no UE support, with equal weightbearing through bilateral lower extremities, independently.   Time 4   Period Weeks   Status Achieved   PT SHORT TERM GOAL #5    Title Pt will improve TUG time from 29.69 sec to 25 seconds for decreased fall risk. Target date: 03/08/15.   Baseline Discontinued due to Timed up and Go Test reinforcing increased velocity of movement, which increases spasticity and decreases quality of movement.   Time 4   Period Weeks   Status Deferred   PT SHORT TERM GOAL #6   Title Pt will ambulate x200' over level surfaces with [facility] and supervision with no overt LOB. Target date: 03/08/15.  Baseline Achieved 03/07/15.   Time 4   Period Weeks   Status Achieved           PT Long Term Goals - 07/10/15 1336    PT LONG TERM GOAL #1   Title Pt will verbalize understanding of fall prevention within home environment. Target 02/10/15   Time 8   Period Weeks   Status Achieved   PT LONG TERM GOAL #2   Title Pt will improve TUG to less than or equal to 25 seconds for decreased fall risk.   Baseline Goal discontinued, as the TUG promotes increased velocity of movement, which increases spasticity and has negative impact on quality of movement.  7/12: 31.87 sec with SBQC   Time 8   Status Deferred  Discontinued; see above.   PT LONG TERM GOAL #3   Title Pt will improve gait velocity to at least 1 ft/sec for improved gait efficiency and safety.   Time 8   Period Weeks   Status Achieved   PT LONG TERM GOAL #4   Title Pt will ambulate at least 50-100 ft using hemiwalker, household distances, with supervision.   Time 8   Period Weeks   Status Achieved   PT LONG TERM GOAL #5   Title Pt will ambulate 150' over outdoor, unlevel surfaces with LRAD and supervision to indicate increased stability/independence with community mobility. Target date: 04/05/15.   Baseline Met 04/03/15 with pt using SBQC, requiring 25% cueing for safe gait velocity.   Time 8   Period Weeks   Status Achieved   PT LONG TERM GOAL #6   Title Pt will negotiate 4 stairs with single rail and supervision to increase pt independence with community mobility. Target date:  04/05/15.   Baseline Achieved 7/29.   Time 8   Period Weeks   Status Achieved   PT LONG TERM GOAL #7   Title Pt will increase gait speed from 1.02 ft/sec to 1.62 ft/sec to indicate significant improvement in efficiency of ambulation. Modified Target date: 05/03/15   Baseline Deferred due to focus on PT being on quality as opposed to velocity of movement.   Time 8   Period Weeks   Status Deferred  8/3: 1.47 ft/sec with SBQC   PT LONG TERM GOAL #8   Title Pt will improve Berg score from 42 to 45/56 to indicate decreased fall risk. Modiifed Target date: 07/25/15   Baseline Revised from goal of 48 to 45/56 to indicate decreased fall rilsk.   Status On-going   PT LONG TERM GOAL  #9   TITLE Pt will improve score on Postural Assessment Scale for Stroke (PASS) from 30/36 to 33/36 to indicate significantly improved ability to maintain stability during positional change.  Target date: 05/31/15   Baseline Met 9/22.   Status Achieved   PT LONG TERM GOAL  #10   TITLE Pt will negotiate standard curb step and standard ramp with mod I using LRAD to indicate increased safety/independence traversing community obstalces.  Modiifed Target date: 07/25/15   Baseline 10/25: supervision, cueing required with RW and L hand orthosis   Status On-going   PT LONG TERM GOAL  #11   TITLE Pt will ambulate 300' over unlevel paved surfaces with mod I using LRAD to indicate increased stability with limited community mobility.  Modiifed Target date: 07/25/15   Baseline Met 11/7, as pt ambulated x600' over unlevel, paved surfaces with mod I using RW and L hand orthosis.   Status Achieved  Plan - 07/12/15 1941    Clinical Impression Statement Session focused on use of weightbearing/transitional movements to decrease LLE extensor tone. Pt tolerance to tall/half kneeling limited by pain in area of L piriformis. Therefore, educated pt/wife on piriformis stretches.   Pt will benefit from skilled therapeutic  intervention in order to improve on the following deficits Abnormal gait;Decreased activity tolerance;Decreased balance;Decreased mobility;Decreased range of motion;Decreased safety awareness;Difficulty walking;Decreased strength;Impaired tone;Decreased knowledge of use of DME;Decreased endurance;Decreased coordination;Impaired perceived functional ability;Postural dysfunction;Impaired sensation;Impaired UE functional use;Impaired vision/preception;Decreased cognition;Pain   Rehab Potential Good   PT Frequency 2x / week   PT Duration 4 weeks   PT Treatment/Interventions ADLs/Self Care Home Management;Therapeutic activities;Functional mobility training;Stair training;Gait training;Therapeutic exercise;Balance training;Neuromuscular re-education;Patient/family education;DME Instruction;Cognitive remediation;Orthotic Fit/Training;Manual techniques;Passive range of motion;Visual/perceptual remediation/compensation   PT Next Visit Plan Begin checking LTG's. Continue to address LLE spasticity and perform task-specific training   Consulted and Agree with Plan of Care Patient;Family member/caregiver   Family Member Consulted wife, [name]        Problem List Patient Active Problem List   Diagnosis Date Noted  . 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 Spastic hemiparesis affecting nondominant side ([place]) 11/08/2014  . Hypothyroidism 10/27/2014  . Left-sided neglect 09/23/2014  . Acute ischemic right MCA stroke ([place]) 09/16/2014  . Left hemiparesis ([place])   . CVA (cerebral vascular accident) ([place])   . Right carotid artery occlusion   . Acute right MCA stroke ([place])   . Essential hypertension 09/12/2014  . CVA (cerebral infarction) 09/12/2014  . Type 2 diabetes mellitus with peripheral neuropathy ([place]) 09/12/2014    Billie Ruddy, PT, DPT [facility] [address] [place], [place], [address] Phone: [phone]   Fax:  [phone] 07/12/2015,  7:46 PM   Name: Brian Hall MRN: [id number] Date of Birth: 02-09-1956

## 2015-07-14 ENCOUNTER — Ambulatory Visit (INDEPENDENT_AMBULATORY_CARE_PROVIDER_SITE_OTHER): Payer: 59 | Admitting: Family Medicine

## 2015-07-14 ENCOUNTER — Encounter: Payer: Self-pay | Admitting: Family Medicine

## 2015-07-14 VITALS — BP 130/79 | HR 62 | Temp 97.2°F | Ht 71.0 in | Wt 246.4 lb

## 2015-07-14 DIAGNOSIS — E038 Other specified hypothyroidism: Secondary | ICD-10-CM

## 2015-07-14 DIAGNOSIS — I10 Essential (primary) hypertension: Secondary | ICD-10-CM

## 2015-07-14 DIAGNOSIS — I69354 Hemiplegia and hemiparesis following cerebral infarction affecting left non-dominant side: Secondary | ICD-10-CM | POA: Diagnosis not present

## 2015-07-14 DIAGNOSIS — Z23 Encounter for immunization: Secondary | ICD-10-CM

## 2015-07-14 DIAGNOSIS — E785 Hyperlipidemia, unspecified: Secondary | ICD-10-CM | POA: Diagnosis not present

## 2015-07-14 DIAGNOSIS — E1142 Type 2 diabetes mellitus with diabetic polyneuropathy: Secondary | ICD-10-CM | POA: Diagnosis not present

## 2015-07-14 LAB — POCT GLYCOSYLATED HEMOGLOBIN (HGB A1C): Hemoglobin A1C: 5.7

## 2015-07-14 MED ORDER — OMEPRAZOLE 40 MG PO CPDR: 40.0000 mg | DELAYED_RELEASE_CAPSULE | Freq: Every day | ORAL | Status: DC

## 2015-07-14 MED ORDER — PRAVASTATIN SODIUM 40 MG PO TABS: 40.0000 mg | ORAL_TABLET | Freq: Every day | ORAL | Status: DC

## 2015-07-14 MED ORDER — NEBIVOLOL HCL 10 MG PO TABS
10.0000 mg | ORAL_TABLET | Freq: Every day | ORAL | Status: DC
Start: 2015-07-14 — End: 2015-07-14

## 2015-07-14 MED ORDER — TRAMADOL HCL 50 MG PO TABS: ORAL_TABLET | ORAL | Status: DC

## 2015-07-14 MED ORDER — CLOPIDOGREL BISULFATE 75 MG PO TABS: 75.0000 mg | ORAL_TABLET | Freq: Every day | ORAL | Status: DC

## 2015-07-14 MED ORDER — LISINOPRIL 40 MG PO TABS: 40.0000 mg | ORAL_TABLET | Freq: Every day | ORAL | Status: DC

## 2015-07-14 MED ORDER — LINAGLIPTIN 5 MG PO TABS: 5.0000 mg | ORAL_TABLET | Freq: Every day | ORAL | Status: DC

## 2015-07-14 MED ORDER — GLIMEPIRIDE 2 MG PO TABS: 2.0000 mg | ORAL_TABLET | Freq: Every day | ORAL | Status: DC

## 2015-07-14 MED ORDER — LEVOTHYROXINE SODIUM 88 MCG PO TABS: ORAL_TABLET | ORAL | Status: DC

## 2015-07-14 MED ORDER — AMLODIPINE BESYLATE 5 MG PO TABS: 5.0000 mg | ORAL_TABLET | Freq: Every day | ORAL | Status: DC

## 2015-07-14 MED ORDER — TIZANIDINE HCL 2 MG PO TABS: 2.0000 mg | ORAL_TABLET | Freq: Three times a day (TID) | ORAL | Status: DC

## 2015-07-14 MED ORDER — CARVEDILOL 12.5 MG PO TABS: 12.5000 mg | ORAL_TABLET | Freq: Two times a day (BID) | ORAL | Status: DC

## 2015-07-14 MED ORDER — CARVEDILOL 25 MG PO TABS: 25.0000 mg | ORAL_TABLET | Freq: Two times a day (BID) | ORAL | Status: DC

## 2015-07-14 NOTE — Progress Notes (Signed)
Subjective:  Patient ID: Brian Hall, male    DOB: Sep 14, 1955  Age: 59 y.o. MRN: [id number]  CC: Diabetes; Hypertension; and Hyperlipidemia   HPI Brian Hall presents for  follow-up of hypertension. Patient has no history of headache chest pain or shortness of breath or recent cough.Patient denies side effects from his medication. States taking it regularly. Patient continues in rehabilitation for his CVA in January the shear. He has proximal movement of the left upper and lower extremity but has no use of the wrist or hand of the left upper extremity. He is walking several 100 feet in therapy with a walker.  Patient also  in for follow-up of elevated cholesterol. Doing well without complaints on current medication. Denies side effects of statin including myalgia and arthralgia and nausea. Also in today for liver function testing. Currently no chest pain, shortness of breath or other cardiovascular related symptoms noted.  Follow-up of diabetes. Patient does check blood sugar at home. Readings run between 90 and 140 with occasional variation into the upper 100s. Patient denies symptoms such as polyuria, polydipsia, excessive hunger, nausea No significant hypoglycemic spells noted. Medications as noted below. Taking them regularly without complication/adverse reaction being reported today.    History Brian Hall has a past medical history of Hypertension; Diabetes mellitus; Obesity; CHF (congestive heart failure) ([place]); SOB (shortness of breath); Hyperkalemia; Renal insufficiency; Cardiogenic shock ([place]); Cardiac LV ejection fraction 10-20%; Hyperlipemia; Hypothyroidism; Throat cancer ([place]) (2011); Stroke [facility]); Blurred vision; Joint pain; and Joint swelling.   He has past surgical history that includes Cardiac catheterization and Neck dissection (Right, 2011).   His family history includes Hypertension in his father and mother; Stroke in his father and sister.He reports that he has never  smoked. He does not have any smokeless tobacco history on file. He reports that he does not drink alcohol or use illicit drugs.  Current Outpatient Prescriptions on File Prior to Visit  Medication Sig Dispense Refill  . cholecalciferol (VITAMIN D) 400 UNITS TABS tablet Take 400 Units by mouth daily.    . Multiple Vitamins-Minerals (MULTIVITAMIN WITH MINERALS) tablet Take 1 tablet by mouth daily.    . ONE TOUCH ULTRA TEST test strip   1   No current facility-administered medications on file prior to visit.    ROS Review of Systems  Constitutional: Negative for fever, chills and diaphoresis.  HENT: Negative for congestion, rhinorrhea and sore throat.   Respiratory: Negative for cough, shortness of breath and wheezing.   Cardiovascular: Negative for chest pain.  Gastrointestinal: Negative for nausea, vomiting, abdominal pain, diarrhea, constipation and abdominal distention.  Genitourinary: Negative for dysuria and frequency.  Musculoskeletal: Negative for joint swelling and arthralgias.  Skin: Negative for rash.  Neurological: Negative for headaches.    Objective:  BP 130/79 mmHg  Pulse 62  Temp(Src) 97.2 F (36.2 C) (Oral)  Ht _0  (1.803 m)  Wt 246 lb 6.4 oz (111.766 kg)  BMI 34.38 kg/m2  SpO2 98%  BP Readings from Last 3 Encounters:  07/14/15 130/79  06/19/15 153/73  05/26/15 120/69    Wt Readings from Last 3 Encounters:  07/14/15 246 lb 6.4 oz (111.766 kg)  04/11/15 240 lb 12.8 oz (109.226 kg)  01/12/15 234 lb 3.2 oz (106.232 kg)     Physical Exam  Constitutional: He is oriented to person, place, and time. He appears well-developed and well-nourished. No distress.  HENT:  Head: Normocephalic and atraumatic.  Right Ear: External ear normal.  Left Ear: External  ear normal.  Nose: Nose normal.  Mouth/Throat: Oropharynx is clear and moist.  Eyes: Conjunctivae and EOM are normal. Pupils are equal, round, and reactive to light.  Neck: Normal range of motion. Neck  supple. No thyromegaly present.  Cardiovascular: Normal rate, regular rhythm and normal heart sounds.   No murmur heard. Pulmonary/Chest: Effort normal and breath sounds normal. No respiratory distress. He has no wheezes. He has no rales.  Abdominal: Soft. Bowel sounds are normal. He exhibits no distension. There is no tenderness.  Musculoskeletal:  Left-sided weakness. He is able to ambulate briefly with a walker. He has minimal to moderate control of the left foot and ankle. With fair coordination he can control the left hip and knee. He has ability to elevate the left shoulder and raise the arm for abduction to about 60. He can flex at the elbow approximately 45.  Lymphadenopathy:    He has no cervical adenopathy.  Neurological: He is alert and oriented to person, place, and time. He has normal reflexes. No cranial nerve deficit. Coordination abnormal.  Skin: Skin is warm and dry.  Psychiatric: He has a normal mood and affect. His behavior is normal. Judgment and thought content normal.    Lab Results  Component Value Date   HGBA1C 5.7 07/14/2015   HGBA1C 5.2 04/11/2015   HGBA1C 5.1 01/09/2015    Lab Results  Component Value Date   WBC 9.4 07/14/2015   HGB 14.0* 10/27/2014   HCT 43.7 07/14/2015   PLT 161 09/19/2014   GLUCOSE 105* 07/14/2015   CHOL 149 07/14/2015   TRIG 256* 07/14/2015   HDL 26* 07/14/2015   LDLCALC 72 07/14/2015   ALT 19 07/14/2015   AST 19 07/14/2015   NA 138 07/14/2015   K 4.9 07/14/2015   CL 99 07/14/2015   CREATININE 1.27 07/14/2015   BUN 18 07/14/2015   CO2 23 07/14/2015   TSH 3.690 10/27/2014   INR 0.95 09/12/2014   HGBA1C 5.7 07/14/2015    No results found.  Assessment & Plan:   Brian Hall was seen today for diabetes, hypertension and hyperlipidemia.  Diagnoses and all orders for this visit:  Essential hypertension -     CBC with Differential/Platelet -     CMP14+EGFR  Type 2 diabetes mellitus with peripheral neuropathy (HCC) -      POCT glycosylated hemoglobin (Hb A1C) -     CMP14+EGFR -     Microalbumin, urine  Other specified hypothyroidism  Hyperlipemia -     Lipid panel  Encounter for immunization  Other orders -     Discontinue: nebivolol (BYSTOLIC) 10 MG tablet; Take 1 tablet (10 mg total) by mouth daily. -     Discontinue: carvedilol (COREG) 12.5 MG tablet; Take 1 tablet (12.5 mg total) by mouth 2 (two) times daily. -     clopidogrel (PLAVIX) 75 MG tablet; Take 1 tablet (75 mg total) by mouth daily. -     glimepiride (AMARYL) 2 MG tablet; Take 1 tablet (2 mg total) by mouth daily with breakfast. -     levothyroxine (SYNTHROID, LEVOTHROID) 88 MCG tablet; take 1 tablet by mouth once daily daily BEFORE BREAKFAST -     linagliptin (TRADJENTA) 5 MG TABS tablet; Take 1 tablet (5 mg total) by mouth daily. -     lisinopril (PRINIVIL,ZESTRIL) 40 MG tablet; Take 1 tablet (40 mg total) by mouth daily. -     omeprazole (PRILOSEC) 40 MG capsule; Take 1 capsule (40 mg total) by mouth  daily. -     pravastatin (PRAVACHOL) 40 MG tablet; Take 1 tablet (40 mg total) by mouth daily. -     tiZANidine (ZANAFLEX) 2 MG tablet; Take 1 tablet (2 mg total) by mouth 3 (three) times daily. -     traMADol (ULTRAM) 50 MG tablet; take 1 tablet by mouth every 6 hours if needed for MODERATE PAIN -     carvedilol (COREG) 25 MG tablet; Take 1 tablet (25 mg total) by mouth 2 (two) times daily. -     amLODipine (NORVASC) 5 MG tablet; Take 1 tablet (5 mg total) by mouth daily. For blood pressure -     Pneumococcal conjugate vaccine 13-valent -     Flu Vaccine QUAD 36+ mos IM   I have discontinued Mr. Mort JANUVIA, BYSTOLIC, carvedilol, nitroGLYCERIN, and nebivolol. I have also changed his clopidogrel, glimepiride, lisinopril, pravastatin, tiZANidine, and carvedilol. Additionally, I am having him start on amLODipine. Lastly, I am having him maintain his multivitamin with minerals, cholecalciferol, ONE TOUCH ULTRA TEST, levothyroxine,  linagliptin, omeprazole, and traMADol.  Meds ordered this encounter  Medications  . DISCONTD: nebivolol (BYSTOLIC) 10 MG tablet    Sig: Take 1 tablet (10 mg total) by mouth daily.    Dispense:  30 tablet    Refill:  5  . DISCONTD: carvedilol (COREG) 12.5 MG tablet    Sig: Take 1 tablet (12.5 mg total) by mouth 2 (two) times daily.    Dispense:  60 tablet    Refill:  1  . clopidogrel (PLAVIX) 75 MG tablet    Sig: Take 1 tablet (75 mg total) by mouth daily.    Dispense:  30 tablet    Refill:  5  . glimepiride (AMARYL) 2 MG tablet    Sig: Take 1 tablet (2 mg total) by mouth daily with breakfast.    Dispense:  30 tablet    Refill:  0  . levothyroxine (SYNTHROID, LEVOTHROID) 88 MCG tablet    Sig: take 1 tablet by mouth once daily daily BEFORE BREAKFAST    Dispense:  30 tablet    Refill:  6  . linagliptin (TRADJENTA) 5 MG TABS tablet    Sig: Take 1 tablet (5 mg total) by mouth daily.    Dispense:  30 tablet    Refill:  5  . lisinopril (PRINIVIL,ZESTRIL) 40 MG tablet    Sig: Take 1 tablet (40 mg total) by mouth daily.    Dispense:  30 tablet    Refill:  5  . omeprazole (PRILOSEC) 40 MG capsule    Sig: Take 1 capsule (40 mg total) by mouth daily.    Dispense:  30 capsule    Refill:  5  . pravastatin (PRAVACHOL) 40 MG tablet    Sig: Take 1 tablet (40 mg total) by mouth daily.    Dispense:  30 tablet    Refill:  5  . tiZANidine (ZANAFLEX) 2 MG tablet    Sig: Take 1 tablet (2 mg total) by mouth 3 (three) times daily.    Dispense:  90 tablet    Refill:  1  . traMADol (ULTRAM) 50 MG tablet    Sig: take 1 tablet by mouth every 6 hours if needed for MODERATE PAIN    Dispense:  60 tablet    Refill:  3  . carvedilol (COREG) 25 MG tablet    Sig: Take 1 tablet (25 mg total) by mouth 2 (two) times daily.    Dispense:  60 tablet  Refill:  2  . amLODipine (NORVASC) 5 MG tablet    Sig: Take 1 tablet (5 mg total) by mouth daily. For blood pressure    Dispense:  30 tablet    Refill:   2     Follow-up: Return in about 3 months (around 10/14/2015).  Claretta Fraise, M.D.

## 2015-07-15 LAB — CMP14+EGFR
A/G RATIO: 1.8 (ref 1.1–2.5)
ALBUMIN: 4.8 g/dL (ref 3.5–5.5)
ALT: 19 IU/L (ref 0–44)
AST: 19 IU/L (ref 0–40)
Alkaline Phosphatase: 60 IU/L (ref 39–117)
BUN / CREAT RATIO: 14 (ref 9–20)
BUN: 18 mg/dL (ref 6–24)
Bilirubin Total: 0.8 mg/dL (ref 0.0–1.2)
CALCIUM: 9.8 mg/dL (ref 8.7–10.2)
CO2: 23 mmol/L (ref 18–29)
Chloride: 99 mmol/L (ref 97–106)
Creatinine, Ser: 1.27 mg/dL (ref 0.76–1.27)
GFR, EST AFRICAN AMERICAN: 72 mL/min/{1.73_m2} (ref >59)
GFR, EST NON AFRICAN AMERICAN: 62 mL/min/{1.73_m2} (ref >59)
GLOBULIN, TOTAL: 2.6 g/dL (ref 1.5–4.5)
GLUCOSE: 105 mg/dL — AB (ref 65–99)
POTASSIUM: 4.9 mmol/L (ref 3.5–5.2)
Sodium: 138 mmol/L (ref 136–144)
TOTAL PROTEIN: 7.4 g/dL (ref 6.0–8.5)

## 2015-07-15 LAB — MICROALBUMIN, URINE: MICROALBUM., U, RANDOM: 184.2 ug/mL

## 2015-07-15 LAB — CBC WITH DIFFERENTIAL/PLATELET
BASOS ABS: 0 10*3/uL (ref 0.0–0.2)
Basos: 0 %
EOS (ABSOLUTE): 0.3 10*3/uL (ref 0.0–0.4)
EOS: 3 %
Hematocrit: 43.7 % (ref 37.5–51.0)
Hemoglobin: 14.9 g/dL (ref 12.6–17.7)
IMMATURE GRANS (ABS): 0 10*3/uL (ref 0.0–0.1)
Immature Granulocytes: 0 %
LYMPHS ABS: 1.7 10*3/uL (ref 0.7–3.1)
Lymphs: 18 %
MCH: 31.2 pg (ref 26.6–33.0)
MCHC: 34.1 g/dL (ref 31.5–35.7)
MCV: 91 fL (ref 79–97)
MONOS ABS: 0.6 10*3/uL (ref 0.1–0.9)
Monocytes: 6 %
NEUTROS PCT: 73 %
Neutrophils Absolute: 6.9 10*3/uL (ref 1.4–7.0)
PLATELETS: 182 10*3/uL (ref 150–379)
RBC: 4.78 x10E6/uL (ref 4.14–5.80)
RDW: 14.6 % (ref 12.3–15.4)
WBC: 9.4 10*3/uL (ref 3.4–10.8)

## 2015-07-15 LAB — LIPID PANEL
CHOL/HDL RATIO: 5.7 ratio — AB (ref 0.0–5.0)
Cholesterol, Total: 149 mg/dL (ref 100–199)
HDL: 26 mg/dL — AB (ref >39)
LDL Calculated: 72 mg/dL (ref 0–99)
Triglycerides: 256 mg/dL — ABNORMAL HIGH (ref 0–149)
VLDL Cholesterol Cal: 51 mg/dL — ABNORMAL HIGH (ref 5–40)

## 2015-07-17 ENCOUNTER — Encounter: Payer: Self-pay | Admitting: Adult Health

## 2015-07-17 ENCOUNTER — Encounter: Payer: 59 | Attending: Physical Medicine & Rehabilitation

## 2015-07-17 ENCOUNTER — Ambulatory Visit (INDEPENDENT_AMBULATORY_CARE_PROVIDER_SITE_OTHER): Payer: 59 | Admitting: Adult Health

## 2015-07-17 ENCOUNTER — Encounter: Payer: Self-pay | Admitting: Physical Medicine & Rehabilitation

## 2015-07-17 ENCOUNTER — Ambulatory Visit (HOSPITAL_BASED_OUTPATIENT_CLINIC_OR_DEPARTMENT_OTHER): Payer: 59 | Admitting: Physical Medicine & Rehabilitation

## 2015-07-17 ENCOUNTER — Ambulatory Visit: Payer: 59 | Admitting: Physical Therapy

## 2015-07-17 VITALS — BP 150/83 | HR 62 | Ht 71.0 in | Wt 246.5 lb

## 2015-07-17 VITALS — BP 152/83 | HR 67 | Resp 14

## 2015-07-17 DIAGNOSIS — G8110 Spastic hemiplegia affecting unspecified side: Secondary | ICD-10-CM

## 2015-07-17 DIAGNOSIS — R2681 Unsteadiness on feet: Secondary | ICD-10-CM

## 2015-07-17 DIAGNOSIS — G4733 Obstructive sleep apnea (adult) (pediatric): Secondary | ICD-10-CM | POA: Diagnosis not present

## 2015-07-17 DIAGNOSIS — I63511 Cerebral infarction due to unspecified occlusion or stenosis of right middle cerebral artery: Secondary | ICD-10-CM | POA: Diagnosis not present

## 2015-07-17 DIAGNOSIS — R269 Unspecified abnormalities of gait and mobility: Secondary | ICD-10-CM

## 2015-07-17 DIAGNOSIS — I69359 Hemiplegia and hemiparesis following cerebral infarction affecting unspecified side: Secondary | ICD-10-CM | POA: Diagnosis not present

## 2015-07-17 DIAGNOSIS — Z8673 Personal history of transient ischemic attack (TIA), and cerebral infarction without residual deficits: Secondary | ICD-10-CM

## 2015-07-17 DIAGNOSIS — G8114 Spastic hemiplegia affecting left nondominant side: Secondary | ICD-10-CM

## 2015-07-17 NOTE — Progress Notes (Signed)
Botox Injection for spasticity using needle EMG guidance  Dilution: 50 Units/ml Indication: Severe spasticity which interferes with ADL,mobility and/or  hygiene and is unresponsive to medication management and other conservative care Informed consent was obtained after describing risks and benefits of the procedure with the patient. This includes bleeding, bruising, infection, excessive weakness, or medication side effects. A REMS form is on file and signed. Needle: 27g 1" and 25g 2" needle electrode Number of units per muscle  Biceps75 FCR25 FCU0 FDS25 FDP50 FPL25   All injections were done after obtaining appropriate EMG activity and after negative drawback for blood. The patient tolerated the procedure well. Post procedure instructions were given. A followup appointment was made.

## 2015-07-17 NOTE — Patient Instructions (Addendum)
Continue Plavix Maintain strict control of blood pressure goal less than 1:30/90 Cholesterol LDL less than 100 Hemoglobin A1c less than 6.5% If you have any strokelike symptoms call 911 immediately. Consider repeat sleep study. Let me know if you decide this.  If your symptoms worsen or you develop new symptoms please let us know.

## 2015-07-17 NOTE — Therapy (Signed)
[place] [address] [place], [place], [address] Phone: [phone]   Fax:  [phone]  Physical Therapy Treatment  Patient Details  Name: Brian Hall MRN: [id number] Date of Birth: [birth year] No Data Recorded  Encounter Date: 07/17/2015      PT End of Session - 07/17/15 1708    Visit Number 17   Number of Visits 27   Date for PT Re-Evaluation 08/11/15   PT Start Time 1448   PT Stop Time 1531   PT Time Calculation (min) 43 min   Activity Tolerance Patient tolerated treatment well   Behavior During Therapy [facility] for tasks assessed/performed      Past Medical History  Diagnosis Date  . Hypertension   . Diabetes mellitus   . Obesity   . CHF (congestive heart failure) ([place])   . SOB (shortness of breath)   . Hyperkalemia   . Renal insufficiency   . Cardiogenic shock ([place])   . Cardiac LV ejection fraction 10-20%   . Hyperlipemia   . Hypothyroidism   . Throat cancer ([place]) 2011    s/p neck dissection, radiation, chemo  . Stroke ([place])   . Blurred vision   . Joint pain   . Joint swelling     Past Surgical History  Procedure Laterality Date  . Cardiac catheterization    . Neck dissection Right 2011    s/p resection of throat cancer with multiple nodes removed    There were no vitals filed for this visit.  Visit Diagnosis:  Abnormality of gait  Unsteadiness  Left spastic hemiplegia (HCC)      Subjective Assessment - 07/17/15 1458    Subjective Pt. received botox injections in LUE today at Dr. Dianna Limbo office.    Patient is accompained by: Family member   Pertinent History CVA with L hemiparesis 09/2014.   Limitations Standing;House hold activities   Patient Stated Goals Pt's goal is to get back to walking and using L hand again.  He has a motorcycle and would like to ride it again.    Currently in Pain? Yes   Pain Score 1    Pain Location Calf   Pain Orientation Left   Pain  Descriptors / Indicators Aching   Pain Type Chronic pain   Pain Onset More than a month ago   Pain Frequency Intermittent   Aggravating Factors  walking   Pain Relieving Factors resting   Multiple Pain Sites Yes   Pain Score 1   Pain Location Ankle   Pain Descriptors / Indicators Sore   Pain Type Chronic pain   Pain Onset More than a month ago   Pain Frequency Intermittent   Aggravating Factors  prolonged sitting   Pain Relieving Factors walking   Pain Score 1   Pain Location Wrist   Pain Orientation Left   Pain Descriptors / Indicators Sore   Pain Type Chronic pain   Pain Frequency Intermittent   Aggravating Factors  using it   Pain Relieving Factors resting                         [facility] Adult PT Treatment/Exercise - 07/17/15 0001    Transfers   Transfers Sit to Stand;Stand to Sit   Sit to Stand 5: Supervision   Sit to Stand Details (indicate cue type and reason) blocked practice of sit <> stand from EOM with RW with pt attempted to secure L hand in  L hand orthosis on walker. Mirror placed at L side for visual feedback, increased awareness of L hand position in orthosis. Will need to reinforce in future sessions.   Stand to Sit 6: Modified independent (Device/Increase time)   Transfer Cueing Pt able to self-manage L hand orthosis prior to performing stand > sit.   Ambulation/Gait   Ambulation/Gait Yes   Ambulation/Gait Assistance 6: Modified independent (Device/Increase time)   Ambulation Distance (Feet) 160 Feet  x2   Assistive device Rolling walker;None  L hand orthosis   Gait Pattern Step-through pattern;Decreased stance time - left;Decreased hip/knee flexion - left;Decreased weight shift to left;Left foot flat;Trunk rotated posteriorly on left;Left circumduction   Ambulation Surface Level;Indoor   Curb 5: Supervision;6: Modified independent (Device/increase time)  x3 trials   Curb Details (indicate cue type and reason) Mod I to ascend, (S) to descend  with RW and L hand orthosis   Berg Balance Test   Sit to Stand Able to stand without using hands and stabilize independently   Standing Unsupported Able to stand safely 2 minutes   Sitting with Back Unsupported but Feet Supported on Floor or Stool Able to sit safely and securely 2 minutes   Stand to Sit Sits safely with minimal use of hands   Transfers Able to transfer with verbal cueing and /or supervision   Standing Unsupported with Eyes Closed Able to stand 10 seconds safely   Standing Ubsupported with Feet Together Able to place feet together independently and stand 1 minute safely   From Standing, Reach Forward with Outstretched Arm Can reach confidently >25 cm (10")   From Standing Position, Pick up Object from Floor Able to pick up shoe, needs supervision   From Standing Position, Turn to Look Behind Over each Shoulder Looks behind from both sides and weight shifts well   Turn 360 Degrees Able to turn 360 degrees safely but slowly   Standing Unsupported, Alternately Place Feet on Step/Stool Able to complete >2 steps/needs minimal assist   Standing Unsupported, One Foot in Front Able to plae foot ahead of the other independently and hold 30 seconds   Standing on One Leg Able to lift leg independently and hold 5-10 seconds   Total Score 46                PT Education - 07/17/15 1712    Education provided Yes   Education Details Berg findings, functional implicatons, decreased fall risk. Discussed plan for discharge from PT after last session.   Person(s) Educated Patient;Spouse   Methods Explanation   Comprehension Verbalized understanding          PT Short Term Goals - 04/11/15 1658    PT SHORT TERM GOAL #1   Title Pt will perform HEP with supervision of family, for improved strength and balance. (Target 01/10/15)   Time 4   Period Weeks   Status Achieved   PT SHORT TERM GOAL #2   Title Pt will improve Timed UP and Go score to less than or equal to 35 seconds for  decreased fall risk.   Baseline Discontinued due to Timed up and Go Test reinforcing increased velocity of movement, which increases spasticity and decreases quality of movement.   Time 4   Period Weeks   Status Achieved   PT SHORT TERM GOAL #3   Title Berg Balance score to be assessed, with pt scoring at least 5 additional points to decrease fall risk.   Time 4   Period Weeks  Status Achieved  Scored 33 today   PT SHORT TERM GOAL #4   Title Pt will perform at least 6 of 10 reps of sit<>stand transfers no UE support, with equal weightbearing through bilateral lower extremities, independently.   Time 4   Period Weeks   Status Achieved   PT SHORT TERM GOAL #5   Title Pt will improve TUG time from 29.69 sec to 25 seconds for decreased fall risk. Target date: 03/08/15.   Baseline Discontinued due to Timed up and Go Test reinforcing increased velocity of movement, which increases spasticity and decreases quality of movement.   Time 4   Period Weeks   Status Deferred   PT SHORT TERM GOAL #6   Title Pt will ambulate x200' over level surfaces with [facility] and supervision with no overt LOB. Target date: 03/08/15.   Baseline Achieved 03/07/15.   Time 4   Period Weeks   Status Achieved           PT Long Term Goals - 07/17/15 1705    PT LONG TERM GOAL #1   Title Pt will verbalize understanding of fall prevention within home environment. Target 02/10/15   Time 8   Period Weeks   Status Achieved   PT LONG TERM GOAL #2   Title Pt will improve TUG to less than or equal to 25 seconds for decreased fall risk.   Baseline Goal discontinued, as the TUG promotes increased velocity of movement, which increases spasticity and has negative impact on quality of movement.  7/12: 31.87 sec with SBQC   Time 8   Status Deferred  Discontinued; see above.   PT LONG TERM GOAL #3   Title Pt will improve gait velocity to at least 1 ft/sec for improved gait efficiency and safety.   Time 8   Period Weeks    Status Achieved   PT LONG TERM GOAL #4   Title Pt will ambulate at least 50-100 ft using hemiwalker, household distances, with supervision.   Time 8   Period Weeks   Status Achieved   PT LONG TERM GOAL #5   Title Pt will ambulate 150' over outdoor, unlevel surfaces with LRAD and supervision to indicate increased stability/independence with community mobility. Target date: 04/05/15.   Baseline Met 04/03/15 with pt using SBQC, requiring 25% cueing for safe gait velocity.   Time 8   Period Weeks   Status Achieved   PT LONG TERM GOAL #6   Title Pt will negotiate 4 stairs with single rail and supervision to increase pt independence with community mobility. Target date: 04/05/15.   Baseline Achieved 7/29.   Time 8   Period Weeks   Status Achieved   PT LONG TERM GOAL #7   Title Pt will increase gait speed from 1.02 ft/sec to 1.62 ft/sec to indicate significant improvement in efficiency of ambulation. Modified Target date: 05/03/15   Baseline Deferred due to focus on PT being on quality as opposed to velocity of movement.   Time 8   Period Weeks   Status Deferred  8/3: 1.47 ft/sec with SBQC   PT LONG TERM GOAL #8   Title Pt will improve Berg score from 42 to 45/56 to indicate decreased fall risk. Modiifed Target date: 07/25/15   Baseline 11/14: Berg score = 46/56   Status Achieved   PT LONG TERM GOAL  #9   TITLE Pt will improve score on Postural Assessment Scale for Stroke (PASS) from 30/36 to 33/36 to indicate significantly improved  ability to maintain stability during positional change.  Target date: 05/31/15   Baseline Met 9/22.   Status Achieved   PT LONG TERM GOAL  #10   TITLE Pt will negotiate standard curb step and standard ramp with mod I using LRAD to indicate increased safety/independence traversing community obstalces.  Modiifed Target date: 07/25/15   Baseline 10/25: supervision, cueing required with RW and L hand orthosis   Status On-going   PT LONG TERM GOAL  #11   TITLE Pt will  ambulate 300' over unlevel paved surfaces with mod I using LRAD to indicate increased stability with limited community mobility.  Modiifed Target date: 07/25/15   Baseline Met 11/7, as pt ambulated x600' over unlevel, paved surfaces with mod I using RW and L hand orthosis.   Status Achieved               Plan - 07/17/15 1709    Clinical Impression Statement Pt met LTG for Berg with score of 46/56, indicating decreased fall risk. Remainder of session focused on management of LUE in L hand orthosis with sit > stand; pt with difficulty placing L hand in L hand orthosis due to LUE sensory impairments and increased spasticity immediately after sit > stand.    Pt will benefit from skilled therapeutic intervention in order to improve on the following deficits Abnormal gait;Decreased activity tolerance;Decreased balance;Decreased mobility;Decreased range of motion;Decreased safety awareness;Difficulty walking;Decreased strength;Impaired tone;Decreased knowledge of use of DME;Decreased endurance;Decreased coordination;Impaired perceived functional ability;Postural dysfunction;Impaired sensation;Impaired UE functional use;Impaired vision/preception;Decreased cognition;Pain   Rehab Potential Good   PT Frequency 2x / week   PT Duration 4 weeks   PT Treatment/Interventions ADLs/Self Care Home Management;Therapeutic activities;Functional mobility training;Stair training;Gait training;Therapeutic exercise;Balance training;Neuromuscular re-education;Patient/family education;DME Instruction;Cognitive remediation;Orthotic Fit/Training;Manual techniques;Passive range of motion;Visual/perceptual remediation/compensation   PT Next Visit Plan Finish checking LTG's and tentatively plan on D/C.   Consulted and Agree with Plan of Care Patient;Family member/caregiver   Family Member Consulted wife, [name]        Problem List Patient Active Problem List   Diagnosis Date Noted  . History of ischemic right MCA  stroke 05/26/2015  . Spastic hemiparesis affecting nondominant side ([place]) 11/08/2014  . Hypothyroidism 10/27/2014  . Left-sided neglect 09/23/2014  . Acute ischemic right MCA stroke ([place]) 09/16/2014  . Left hemiparesis ([place])   . CVA (cerebral vascular accident) ([place])   . Right carotid artery occlusion   . Acute right MCA stroke ([place])   . Essential hypertension 09/12/2014  . CVA (cerebral infarction) 09/12/2014  . Type 2 diabetes mellitus with peripheral neuropathy ([place]) 09/12/2014    Billie Ruddy, PT, DPT [facility] [address] [place], [place], [address] Phone: [phone]   Fax:  [phone] 07/17/2015, 5:14 PM   Name: Brian Hall MRN: [id number] Date of Birth: 03/29/1956

## 2015-07-17 NOTE — Progress Notes (Signed)
PATIENT: Brian Hall DOB: December 17, 1955  REASON FOR VISIT: follow up- stroke. HISTORY FROM: patient  HISTORY OF PRESENT ILLNESS: Brian Hall is a 59 year old male with a history of stroke. He returns today for follow-up. He is currently taking Plavix and tolerating it well. Denies any significant bruising or bleeding. Patient's primary care is managing his hypertension, hyperlipidemia and diabetes. Patient's blood pressure is slightly elevated today. The patient continues to have left-sided weakness. He is still in physical therapy. He is able to use a walker when ambulating. He has an AFO brace on the left foot. He is able to complete all ADLs independently. He does not operate a motor vehicle. He does not smoke cigarettes. The patient follows up with his primary care every 3 months. He recently had blood work in his hemoglobin A1c was 5.7. His cholesterol LDL was 72. He states that his primary care adjust his blood pressure medication on Friday. Patient has been diagnosed with obstructive sleep apnea in the past. He was using a CPAP however he had lymph nodes removed from the neck and at that time they removed his tonsils as well. Since then the patient has not snore and no longer has daytime sleepiness and therefore stopped using his CPAP machine. He returns today for an evaluation.    HISTORY 01/12/15 ([place]): 10 year Caucasian male seen today for the first office follow-up visit following hospital admission for stroke on 09/11/13. He developed sudden onset of slurred speech and left-sided weakness when he stood up and tone. He actually had fallen the night before as well and did not choose to come to the hospital. He presented beyond time window for thrombolysis. He had significant left hemiplegia on admission. CT scan of the head showed right MCA infarct with cytotoxic edema and subsequently MRI scan was obtained which showed even increase edema in the right insula as well as frontal lobe.  There is an old infarct noted in the left occipital lobe. MRA of the brain was motion degraded but showed occluded right internal carotid artery in the neck with some distal reconstitution at the terminus via flow from circle of Willis. Transthoracic echo showed a decreased ejection fraction of 30-35% but no definite clot. There was diffuse hypokinesis. Carotid Dopplers confirmed right extracranial ICA occlusion and no significant stenosis on the left. LDL cholesterol was 88 he was on Pravachol which was resumed. Patient was started on aspirin and Plavix for 3 months. He has been discharged from rehabilitation and is being home for last 3 months. He has finished home therapy and is currently participating in outpatient physical and occupational therapy. He is able to walk with a cane with a therapist But at home he has to walk with a belt and a walker and the wife standing. His blood pressure is quite well controlled and today it is 1-4/78. His fasting sugars have also been well controlled. He is tolerating aspirin and Plavix with only minor bruising but no bleeding episodes. He has remote history of thyroid cancer and had received neck radiation.   REVIEW OF SYSTEMS: Out of a complete 14 system review of symptoms, the patient complains only of the following symptoms, and all other reviewed systems are negative.  Daytime sleepiness, aching muscles, nervous/anxious, memory loss   ALLERGIES: No Known Allergies  HOME MEDICATIONS: Outpatient Prescriptions Prior to Visit  Medication Sig Dispense Refill  . amLODipine (NORVASC) 5 MG tablet Take 1 tablet (5 mg total) by mouth daily. For blood pressure  30 tablet 2  . carvedilol (COREG) 25 MG tablet Take 1 tablet (25 mg total) by mouth 2 (two) times daily. 60 tablet 2  . cholecalciferol (VITAMIN D) 400 UNITS TABS tablet Take 400 Units by mouth daily.    . clopidogrel (PLAVIX) 75 MG tablet Take 1 tablet (75 mg total) by mouth daily. 30 tablet 5  . glimepiride  (AMARYL) 2 MG tablet Take 1 tablet (2 mg total) by mouth daily with breakfast. 30 tablet 0  . levothyroxine (SYNTHROID, LEVOTHROID) 88 MCG tablet take 1 tablet by mouth once daily daily BEFORE BREAKFAST 30 tablet 6  . linagliptin (TRADJENTA) 5 MG TABS tablet Take 1 tablet (5 mg total) by mouth daily. 30 tablet 5  . lisinopril (PRINIVIL,ZESTRIL) 40 MG tablet Take 1 tablet (40 mg total) by mouth daily. 30 tablet 5  . Multiple Vitamins-Minerals (MULTIVITAMIN WITH MINERALS) tablet Take 1 tablet by mouth daily.    Marland Kitchen omeprazole (PRILOSEC) 40 MG capsule Take 1 capsule (40 mg total) by mouth daily. 30 capsule 5  . ONE TOUCH ULTRA TEST test strip   1  . pravastatin (PRAVACHOL) 40 MG tablet Take 1 tablet (40 mg total) by mouth daily. 30 tablet 5  . tiZANidine (ZANAFLEX) 2 MG tablet Take 1 tablet (2 mg total) by mouth 3 (three) times daily. 90 tablet 1  . traMADol (ULTRAM) 50 MG tablet take 1 tablet by mouth every 6 hours if needed for MODERATE PAIN 60 tablet 3   No facility-administered medications prior to visit.    PAST MEDICAL HISTORY: Past Medical History  Diagnosis Date  . Hypertension   . Diabetes mellitus   . Obesity   . CHF (congestive heart failure) ([place])   . SOB (shortness of breath)   . Hyperkalemia   . Renal insufficiency   . Cardiogenic shock ([place])   . Cardiac LV ejection fraction 10-20%   . Hyperlipemia   . Hypothyroidism   . Throat cancer ([place]) 2011    s/p neck dissection, radiation, chemo  . Stroke ([place])   . Blurred vision   . Joint pain   . Joint swelling     PAST SURGICAL HISTORY: Past Surgical History  Procedure Laterality Date  . Cardiac catheterization    . Neck dissection Right 2011    s/p resection of throat cancer with multiple nodes removed    FAMILY HISTORY: Family History  Problem Relation Age of Onset  . Hypertension Mother   . Hypertension Father   . Stroke Father   . Stroke Sister     SOCIAL HISTORY: Social History   Social History  .  Marital Status: Legally Separated    Spouse Name: N/A  . Number of Children: 1  . Years of Education: 11   Occupational History  . truck driver, retired    Social History Main Topics  . Smoking status: Never Smoker   . Smokeless tobacco: Not on file  . Alcohol Use: No  . Drug Use: No  . Sexual Activity: Not on file   Other Topics Concern  . Not on file   Social History Narrative   Married, 1 daughter   Right handed   Caffeine use - rare tea         PHYSICAL EXAM  Filed Vitals:   07/17/15 1306  BP: 150/83  Pulse: 62  Height: 5\' 11"  (1.803 m)  Weight: 246 lb 8 oz (111.812 kg)   Body mass index is 34.4 kg/(m^2).  Generalized: Well developed, in  no acute distress   Neurological examination  Mentation: Alert oriented to time, place, history taking. Follows all commands speech and language fluent Cranial nerve II-XII: Pupils were equal round reactive to light. Extraocular movements were full, visual field were full on confrontational test. Facial sensation and strength were normal. slight left facial droop.  Uvula tongue midline. Head turning and shoulder shrug  were normal and symmetric. Motor: The motor testing reveals 5 over 5 strenin the right upper and lower extremity. 3 out of 5 strength in the left upper and lower extremity. Limited movement in the left hand. Kermit Balo symmetric motor tone is noted throughout.  Sensory: Sensory testing is intact to soft touch on all 4 extremities. No evidence of extinction is noted.  Coordination: Cerebellar testing reveals good finger-nose-finger and heel-to-shin  On the right some difficulty on the left due to weakness. Gait and station: patient uses a walker to ambulate. Tandem gait not attempted.    DIAGNOSTIC DATA (LABS, IMAGING, TESTING) - I reviewed patient records, labs, notes, testing and imaging myself where available.  Lab Results  Component Value Date   WBC 9.4 07/14/2015   HGB 14.0* 10/27/2014   HCT 43.7 07/14/2015     MCV 93.0 10/27/2014   PLT 161 09/19/2014      Component Value Date/Time   NA 138 07/14/2015 1101   NA 133* 09/19/2014 0626   K 4.9 07/14/2015 1101   CL 99 07/14/2015 1101   CO2 23 07/14/2015 1101   GLUCOSE 105* 07/14/2015 1101   GLUCOSE 243* 09/19/2014 0626   BUN 18 07/14/2015 1101   BUN 42* 09/19/2014 0626   CREATININE 1.27 07/14/2015 1101   CALCIUM 9.8 07/14/2015 1101   PROT 7.4 07/14/2015 1101   PROT 7.1 09/19/2014 0626   ALBUMIN 4.8 07/14/2015 1101   ALBUMIN 3.5 09/19/2014 0626   AST 19 07/14/2015 1101   ALT 19 07/14/2015 1101   ALKPHOS 60 07/14/2015 1101   BILITOT 0.8 07/14/2015 1101   BILITOT 1.1 09/19/2014 0626   GFRNONAA 62 07/14/2015 1101   GFRAA 72 07/14/2015 1101   Lab Results  Component Value Date   CHOL 149 07/14/2015   HDL 26* 07/14/2015   LDLCALC 72 07/14/2015   TRIG 256* 07/14/2015   CHOLHDL 5.7* 07/14/2015   Lab Results  Component Value Date   HGBA1C 5.7 07/14/2015   No results found for: [id number] Lab Results  Component Value Date   TSH 3.690 10/27/2014      ASSESSMENT AND PLAN 59 y.o. year old male  has a past medical history of Hypertension; Diabetes mellitus; Obesity; CHF (congestive heart failure) ([place]); SOB (shortness of breath); Hyperkalemia; Renal insufficiency; Cardiogenic shock ([place]); Cardiac LV ejection fraction 10-20%; Hyperlipemia; Hypothyroidism; Throat cancer ([place]) (2011); Stroke [facility]); Blurred vision; Joint pain; and Joint swelling. here with:  1. Stroke 2. Left sided hemiparesis 3. OSA  Overall the patient is doing well. He will continue on Plavix for stroke prevention. He should maintain strict control of his blood pressure with goal less than 130/90. Cholesterol LDL less than 100 and hemoglobin A1c less than 6.5%. Patient encouraged to monitor his diet and engage in regular exercise. Patient had a sleep study 5 years ago that indicated sleep apnea however after having his tonsils removed his snoring and daytime sleepiness  improved. Therefore the patient has not been using his CPAP. I will refer the patient for a sleep study to ensure that his sleep apnea is adequately treated. Patient advised that if he has  any strokelike symptoms he should call 911 immediately. He will follow-up in 6 months or sooner if needed.   Ward Givens, MSN, NP-C 07/17/2015, 1:24 PM [facility] [address] [place], [place] [address] [phone]

## 2015-07-17 NOTE — Patient Instructions (Signed)

## 2015-07-17 NOTE — Progress Notes (Signed)
I have read the note, and I agree with the clinical assessment and plan.  Attila Mccarthy KEITH   

## 2015-07-19 ENCOUNTER — Ambulatory Visit: Payer: 59 | Admitting: Physical Therapy

## 2015-07-19 DIAGNOSIS — R269 Unspecified abnormalities of gait and mobility: Secondary | ICD-10-CM

## 2015-07-19 DIAGNOSIS — G8114 Spastic hemiplegia affecting left nondominant side: Secondary | ICD-10-CM

## 2015-07-19 NOTE — Therapy (Signed)
[facility] [address] [place], [place], [address] Phone: [phone]   Fax:  [phone]  Physical Therapy Treatment  Patient Details  Name: Brian Hall MRN: [id number] Date of Birth: 07/26/1956 No Data Recorded  Encounter Date: 07/19/2015      PT End of Session - 07/19/15 1911    Visit Number 63   Number of Visits 49   Date for PT Re-Evaluation 08/11/15   PT Start Time 1315   PT Stop Time 1402   PT Time Calculation (min) 47 min   Activity Tolerance Patient tolerated treatment well   Behavior During Therapy [facility] for tasks assessed/performed      Past Medical History  Diagnosis Date  . Hypertension   . Diabetes mellitus   . Obesity   . CHF (congestive heart failure) ([place])   . SOB (shortness of breath)   . Hyperkalemia   . Renal insufficiency   . Cardiogenic shock ([place])   . Cardiac LV ejection fraction 10-20%   . Hyperlipemia   . Hypothyroidism   . Throat cancer ([place]) 2011    s/p neck dissection, radiation, chemo  . Stroke ([place])   . Blurred vision   . Joint pain   . Joint swelling     Past Surgical History  Procedure Laterality Date  . Cardiac catheterization    . Neck dissection Right 2011    s/p resection of throat cancer with multiple nodes removed    There were no vitals filed for this visit.  Visit Diagnosis:  Abnormality of gait  Left spastic hemiplegia ([place])      Subjective Assessment - 07/19/15 1859    Subjective During discussion of discharge from outpatient PT today, pt tearful but verbalizes understanding of PT ratoionale.   Patient is accompained by: Family member  wife, [name]   Pertinent History CVA with L hemiparesis 09/2014.   Limitations Standing;House hold activities   Patient Stated Goals Pt's goal is to get back to walking and using L hand again.  He has a motorcycle and would like to ride it again.    Currently in Pain? Yes   Pain Score 1    Pain Location  Calf   Pain Orientation Left   Pain Descriptors / Indicators Aching   Pain Type Chronic pain   Pain Onset More than a month ago   Pain Frequency Intermittent   Aggravating Factors  walking   Pain Relieving Factors resting   Multiple Pain Sites Yes   Pain Score 1   Pain Location Ankle   Pain Orientation Left   Pain Descriptors / Indicators Sore   Pain Type Chronic pain   Pain Onset More than a month ago   Pain Frequency Intermittent   Aggravating Factors  prolonged sitting   Pain Relieving Factors walking   Pain Score 1   Pain Location Wrist   Pain Orientation Left   Pain Descriptors / Indicators Sore   Pain Type Chronic pain   Pain Onset More than a month ago   Pain Frequency Intermittent   Aggravating Factors  using wrist   Pain Relieving Factors resting                         [facility] Adult PT Treatment/Exercise - 07/19/15 0001    Transfers   Transfers Sit to Stand;Stand to Sit   Sit to Stand 5: Supervision;6: Modified independent (Device/Increase time)   Sit to Stand Details (  indicate cue type and reason) with cueing for self-management of L fingers (maintaining finger extension) during sit > stand, pt able to place/secure L hand in hand orthosis on rolling walker without assistance from PT or wife.   Stand to Sit 6: Modified independent (Device/Increase time)   Transfer Cueing Pt able to consistently manage LUE (remove from L hand orthosis) prior to performing stand > sit   Ambulation/Gait   Ambulation/Gait Yes   Ambulation/Gait Assistance 6: Modified independent (Device/Increase time)   Ambulation Distance (Feet) 250 Feet  then x120'   Assistive device Rolling walker;None  L hand orthosis   Gait Pattern Step-through pattern;Decreased stance time - left;Decreased hip/knee flexion - left;Decreased weight shift to left;Left foot flat;Trunk rotated posteriorly on left;Left circumduction   Ambulation Surface Level;Unlevel;Indoor;Outdoor;Paved   Stairs Yes    Stairs Assistance 5: Supervision   Stairs Assistance Details (indicate cue type and reason) During initial trial x4 stairs, pt ascended/descended forward-facing with B rails with step-to pattern and cueing for advancement of L hand on rail jusing RUE. Subsequent trial with BUE support on L rail, ascending/descending laterally with step-to pattern; final trial with RUE at R rail with step-to pattern.   Stair Management Technique One rail Right;One rail Left;Two rails;Step to pattern;Sideways;Forwards   Number of Stairs 12   Height of Stairs 6   Curb 6: Modified independent (Device/increase time)   Curb Details (indicate cue type and reason) using RW and L hand orthosis                PT Education - 07/19/15 1904    Education provided Yes   Education Details Goals, progress, and DC plan. Discussed CVA support group.   Person(s) Educated Patient;Spouse   Methods Explanation   Comprehension Verbalized understanding          PT Short Term Goals - 04/11/15 1658    PT SHORT TERM GOAL #1   Title Pt will perform HEP with supervision of family, for improved strength and balance. (Target 01/10/15)   Time 4   Period Weeks   Status Achieved   PT SHORT TERM GOAL #2   Title Pt will improve Timed UP and Go score to less than or equal to 35 seconds for decreased fall risk.   Baseline Discontinued due to Timed up and Go Test reinforcing increased velocity of movement, which increases spasticity and decreases quality of movement.   Time 4   Period Weeks   Status Achieved   PT SHORT TERM GOAL #3   Title Berg Balance score to be assessed, with pt scoring at least 5 additional points to decrease fall risk.   Time 4   Period Weeks   Status Achieved  Scored 33 today   PT SHORT TERM GOAL #4   Title Pt will perform at least 6 of 10 reps of sit<>stand transfers no UE support, with equal weightbearing through bilateral lower extremities, independently.   Time 4   Period Weeks   Status  Achieved   PT SHORT TERM GOAL #5   Title Pt will improve TUG time from 29.69 sec to 25 seconds for decreased fall risk. Target date: 03/08/15.   Baseline Discontinued due to Timed up and Go Test reinforcing increased velocity of movement, which increases spasticity and decreases quality of movement.   Time 4   Period Weeks   Status Deferred   PT SHORT TERM GOAL #6   Title Pt will ambulate x200' over level surfaces with [facility] and supervision with  no overt LOB. Target date: 03/08/15.   Baseline Achieved 03/07/15.   Time 4   Period Weeks   Status Achieved           PT Long Term Goals - 07/19/15 1912    PT LONG TERM GOAL #1   Title Pt will verbalize understanding of fall prevention within home environment. Target 02/10/15   Time 8   Period Weeks   Status Achieved   PT LONG TERM GOAL #2   Title Pt will improve TUG to less than or equal to 25 seconds for decreased fall risk.   Baseline Goal discontinued, as the TUG promotes increased velocity of movement, which increases spasticity and has negative impact on quality of movement.  7/12: 31.87 sec with SBQC   Time 8   Status Deferred  Discontinued; see above.   PT LONG TERM GOAL #3   Title Pt will improve gait velocity to at least 1 ft/sec for improved gait efficiency and safety.   Time 8   Period Weeks   Status Achieved   PT LONG TERM GOAL #4   Title Pt will ambulate at least 50-100 ft using hemiwalker, household distances, with supervision.   Time 8   Period Weeks   Status Achieved   PT LONG TERM GOAL #5   Title Pt will ambulate 150' over outdoor, unlevel surfaces with LRAD and supervision to indicate increased stability/independence with community mobility. Target date: 04/05/15.   Baseline Met 04/03/15 with pt using SBQC, requiring 25% cueing for safe gait velocity.   Time 8   Period Weeks   Status Achieved   PT LONG TERM GOAL #6   Title Pt will negotiate 4 stairs with single rail and supervision to increase pt independence with  community mobility. Target date: 04/05/15.   Baseline Achieved 7/29.   Time 8   Period Weeks   Status Achieved   PT LONG TERM GOAL #7   Title Pt will increase gait speed from 1.02 ft/sec to 1.62 ft/sec to indicate significant improvement in efficiency of ambulation. Modified Target date: 05/03/15   Baseline Deferred due to focus on PT being on quality as opposed to velocity of movement.   Time 8   Period Weeks   Status Deferred  8/3: 1.47 ft/sec with SBQC   PT LONG TERM GOAL #8   Title Pt will improve Berg score from 42 to 45/56 to indicate decreased fall risk. Modiifed Target date: 07/25/15   Baseline 11/14: Berg score = 46/56   Status Achieved   PT LONG TERM GOAL  #9   TITLE Pt will improve score on Postural Assessment Scale for Stroke (PASS) from 30/36 to 33/36 to indicate significantly improved ability to maintain stability during positional change.  Target date: 05/31/15   Baseline Met 9/22.   Status Achieved   PT LONG TERM GOAL  #10   TITLE Pt will negotiate standard curb step and standard ramp with mod I using LRAD to indicate increased safety/independence traversing community obstalces.  Modiifed Target date: 07/25/15   Baseline Met 11/16 using RW and L hand orthosis.   Status Achieved   PT LONG TERM GOAL  #11   TITLE Pt will ambulate 300' over unlevel paved surfaces with mod I using LRAD to indicate increased stability with limited community mobility.  Modiifed Target date: 07/25/15   Baseline Met 11/7, as pt ambulated x600' over unlevel, paved surfaces with mod I using RW and L hand orthosis.   Status Achieved  Plan - 07/19/15 1913    Clinical Impression Statement Pt has met all short and long term goals and is now mod I for both household and community mobility using RW and L hand orthosis. Therefore, pt discharged from outpatient PT at this time. Pt/wife verbalized understanding and ere in full agreement with DC plan.   Consulted and Agree with Plan of  Care Patient;Family member/caregiver   Family Member Consulted wife, [name]        Problem List Patient Active Problem List   Diagnosis Date Noted  . 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 Spastic hemiparesis affecting nondominant side ([place]) 11/08/2014  . Hypothyroidism 10/27/2014  . Left-sided neglect 09/23/2014  . Acute ischemic right MCA stroke ([place]) 09/16/2014  . Left hemiparesis ([place])   . CVA (cerebral vascular accident) ([place])   . Right carotid artery occlusion   . Acute right MCA stroke ([place])   . Essential hypertension 09/12/2014  . CVA (cerebral infarction) 09/12/2014  . Type 2 diabetes mellitus with peripheral neuropathy ([place]) 09/12/2014   Billie Ruddy, PT, DPT [facility] [address] [place], [place], [address] Phone: [phone]   Fax:  [phone] 07/19/2015, 7:16 PM   Name: TREVAN MESSMAN MRN: [id number] Date of Birth: July 14, 1956

## 2015-08-07 ENCOUNTER — Encounter: Payer: Self-pay | Admitting: Occupational Therapy

## 2015-08-07 ENCOUNTER — Ambulatory Visit: Payer: 59 | Attending: Physical Medicine & Rehabilitation | Admitting: Occupational Therapy

## 2015-08-07 DIAGNOSIS — R201 Hypoesthesia of skin: Secondary | ICD-10-CM | POA: Insufficient documentation

## 2015-08-07 DIAGNOSIS — R4189 Other symptoms and signs involving cognitive functions and awareness: Secondary | ICD-10-CM | POA: Insufficient documentation

## 2015-08-07 DIAGNOSIS — R414 Neurologic neglect syndrome: Secondary | ICD-10-CM | POA: Diagnosis present

## 2015-08-07 DIAGNOSIS — G8194 Hemiplegia, unspecified affecting left nondominant side: Secondary | ICD-10-CM | POA: Diagnosis present

## 2015-08-07 DIAGNOSIS — Z7409 Other reduced mobility: Secondary | ICD-10-CM | POA: Diagnosis present

## 2015-08-07 NOTE — Therapy (Signed)
[facility] [address] [place], [place], [address] Phone: [phone]   Fax:  [phone]  Occupational Therapy Treatment  Patient Details  Name: Brian Hall MRN: [id number] Date of Birth: 11-Oct-1955 No Data Recorded  Encounter Date: 08/07/2015      OT End of Session - 08/07/15 1641    Visit Number 86   Number of Visits 28   Date for OT Re-Evaluation 09/01/15   Authorization Type [employer]   OT Start Time 1326   OT Stop Time 1404   OT Time Calculation (min) 38 min   Activity Tolerance Patient tolerated treatment well      Past Medical History  Diagnosis Date  . Hypertension   . Diabetes mellitus   . Obesity   . CHF (congestive heart failure) ([place])   . SOB (shortness of breath)   . Hyperkalemia   . Renal insufficiency   . Cardiogenic shock ([place])   . Cardiac LV ejection fraction 10-20%   . Hyperlipemia   . Hypothyroidism   . Throat cancer ([place]) 2011    s/p neck dissection, radiation, chemo  . Stroke ([place])   . Blurred vision   . Joint pain   . Joint swelling     Past Surgical History  Procedure Laterality Date  . Cardiac catheterization    . Neck dissection Right 2011    s/p resection of throat cancer with multiple nodes removed    There were no vitals filed for this visit.  Visit Diagnosis:  Hemiplegia affecting left nondominant side ([place]) - Plan: Ot plan of care cert/re-cert  Impaired functional mobility and activity tolerance - Plan: Ot plan of care cert/re-cert  Impaired sensation - Plan: Ot plan of care cert/re-cert  Impaired cognition - Plan: Ot plan of care cert/re-cert  Left-sided neglect - Plan: Ot plan of care cert/re-cert      Subjective Assessment - 08/07/15 1328    Subjective  I can't hold anything since I had that shot (botox injection)   Patient is accompained by: Family member  wife   Pertinent History see epic snapshot   Patient Stated Goals Get back to doing some  of the things I used to do.   Currently in Pain? No/denies                      OT Treatments/Exercises (OP) - 08/07/15 0001    Neurological Re-education Exercises   Other Exercises 1 Pt returns after being on hold after botox injection.  Pt now presents with moderate (vs severe) flexor tone in LUE. Bicep is much more relaxed and pt much better able to do low and mid reach while maintaining elbow extension especially in supine.  Pt also with more isolated wrist flexion and finger flexion (vs overflow tone).  Pt at this point does not demonstate any isolated finger or wrist extension at this time. Pt c/o of tight and painful shoulder - stated he had not really done his HEP for the past three weeks.  Discussed importance of doing HEP in order to keep shoulder loose and prevent shoulder pain.  Neuro re ed in supine to address decreasing tightness and increasing soft tissue length in shoulder girdle followed by active shoulder flexion in supine with PVC square. Pt able to achieve 120* of flexion in supine by end of session without pain.                   OT Short  Term Goals - 08/07/15 1637    OT SHORT TERM GOAL  #9   Status --  pt is now stepping over ledge and into shower wtih min with wife to sit on seat   OT SHORT TERM GOAL  #11   TITLE Pt and wife will be mod I with upgraded HEP - 05/23/2015 (date adjusted as pt missed 2 weeks of therapy)   Status Achieved   OT SHORT TERM GOAL  #12   TITLE Pt will be supevision for simple hot meal prep   Status Achieved  05/25/2015   OT SHORT TERM GOAL  #13   TITLE Pt will demonstrate ability for bilateral mid reach with light object without facilitation.   Status Achieved           OT Long Term Goals - 08/07/15 1637    OT LONG TERM GOAL #8   Title Pt will demonstrate the ability to use LUE as gross a for basic ADL's 100% of the time - 09/01/2015 with renewal   Status On-going  able to use for bathing   OT LONG TERM GOAL  #10    TITLE Pt will be mod I for simple hot meal prep   Status Achieved   OT LONG TERM GOAL  #11   TITLE Pt will demonstrate ability for functional tasks at ambulatory level for 10 minutes without needing rest break.   Status Achieved  with min facilitation and min compensations   OT LONG TERM GOAL  #12   TITLE Pt will demonstrate ability to release objects with mod facilitation. 09/01/2015 with renewal   Status On-going   OT LONG TERM GOAL  #13   TITLE Pt will demonstrate ability for unilateral low reach with assist for gross grasp with mn compensations.   Status Achieved   OT LONG TERM GOAL  #14   TITLE Pt will be mod I with upgraded HEP prn   Status Achieved               Plan - 08/07/15 1638    Clinical Impression Statement Pt returns today after botox injection - see treatment note for current status.  Strongly encouraged consistent completion of HEP and pt in agreement.    Pt will benefit from skilled therapeutic intervention in order to improve on the following deficits (Retired) Decreased balance;Decreased coordination;Decreased cognition;Decreased knowledge of use of DME;Decreased mobility;Decreased range of motion;Decreased safety awareness;Difficulty walking;Decreased strength;Increased edema;Increased muscle spasms;Impaired UE functional use;Impaired tone;Impaired sensation;Impaired vision/preception;Pain   Rehab Potential Good   Clinical Impairments Affecting Rehab Potential apraxia, sensory loss,perceptual deficits, cognitive impairment   OT Frequency 2x / week   OT Duration 4 weeks   OT Treatment/Interventions Self-care/ADL training;Cryotherapy;Moist Heat;Electrical Stimulation;Fluidtherapy;DME and/or AE instruction;Energy conservation;Neuromuscular education;Therapeutic exercise;Functional Mobility Training;Manual Therapy;Passive range of motion;Splinting;Therapeutic activities;Balance training;Patient/family education;Cognitive remediation/compensation   Plan update HEP  prn, estim for finger and wrist extension.   Consulted and Agree with Plan of Care Patient;Family member/caregiver   Family Member Consulted wife        Problem List Patient Active Problem List   Diagnosis Date Noted  . 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 Spastic hemiparesis affecting nondominant side ([facility] [facility]) 11/08/2014  . Hypothyroidism 10/27/2014  . Left-sided neglect 09/23/2014  . Acute ischemic right MCA stroke ([place] [facility]) 09/16/2014  . Left hemiparesis ([place])   . CVA (cerebral vascular accident) ([facility])   . Right carotid artery occlusion   . Acute right MCA stroke ([place])   . Essential hypertension 09/12/2014  .  CVA (cerebral infarction) 09/12/2014  . Type 2 diabetes mellitus with peripheral neuropathy ([place]) 09/12/2014    Quay Burow, OTR/L 08/07/2015, 4:44 PM  [facility] [address] [place], [place], [address] Phone: [phone]   Fax:  [phone]  Name: Brian Hall MRN: [id number] Date of Birth: 1955/12/26

## 2015-08-10 ENCOUNTER — Encounter: Payer: Self-pay | Admitting: Occupational Therapy

## 2015-08-10 ENCOUNTER — Ambulatory Visit: Payer: 59 | Admitting: Occupational Therapy

## 2015-08-10 DIAGNOSIS — G8194 Hemiplegia, unspecified affecting left nondominant side: Secondary | ICD-10-CM

## 2015-08-10 DIAGNOSIS — R201 Hypoesthesia of skin: Secondary | ICD-10-CM

## 2015-08-10 DIAGNOSIS — Z7409 Other reduced mobility: Secondary | ICD-10-CM

## 2015-08-10 DIAGNOSIS — R4189 Other symptoms and signs involving cognitive functions and awareness: Secondary | ICD-10-CM

## 2015-08-10 DIAGNOSIS — R414 Neurologic neglect syndrome: Secondary | ICD-10-CM

## 2015-08-10 NOTE — Therapy (Signed)
[facility] [address] [place], [place], [address] Phone: [phone]   Fax:  [phone]  Occupational Therapy Treatment  Patient Details  Name: Brian Hall MRN: [id number] Date of Birth: 09/23/1955 No Data Recorded  Encounter Date: 08/10/2015      OT End of Session - 08/10/15 1640    Visit Number 49   Number of Visits 56   Date for OT Re-Evaluation 09/01/15   Authorization Type [employer]   OT Start Time 1320   OT Stop Time 1400   OT Time Calculation (min) 40 min   Activity Tolerance Patient tolerated treatment well      Past Medical History  Diagnosis Date  . Hypertension   . Diabetes mellitus   . Obesity   . CHF (congestive heart failure) ([facility])   . SOB (shortness of breath)   . Hyperkalemia   . Renal insufficiency   . Cardiogenic shock ([facility])   . Cardiac LV ejection fraction 10-20%   . Hyperlipemia   . Hypothyroidism   . Throat cancer ([facility]) 2011    s/p neck dissection, radiation, chemo  . Stroke ([place])   . Blurred vision   . Joint pain   . Joint swelling     Past Surgical History  Procedure Laterality Date  . Cardiac catheterization    . Neck dissection Right 2011    s/p resection of throat cancer with multiple nodes removed    There were no vitals filed for this visit.  Visit Diagnosis:  Hemiplegia affecting left nondominant side ([facility])  Impaired functional mobility and activity tolerance  Impaired sensation  Impaired cognition  Left-sided neglect      Subjective Assessment - 08/10/15 1321    Subjective  I have been walking alot more this week.   Patient is accompained by: Family member  wife   Pertinent History see epic snapshot   Patient Stated Goals Get back to doing some of the things I used to do.   Currently in Pain? No/denies                      OT Treatments/Exercises (OP) - 08/10/15 0001    Neurological Re-education Exercises   Other Exercises 1  Neuro re ed to facilitate active relase in R hand.  Pt now exhibits very beginning movement in R thumb in terms of adduction with functional tasks.  Pt with great difficulty grading activty however with practice and focused concentration this does improve. Pt benefits signficant repetition.  Pt requires max c's and max facilitation for release but improved ability to grade grasp.     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   Electrical Stimulation Action wrist and finger extension   Electrical Stimulation Parameters 250 pw, 50pps, 10 sec cycle, 2 sec ramp, intensity 45   Electrical Stimulation Goals Tone;Neuromuscular facilitation                  OT Short Term Goals - 08/10/15 1618    OT SHORT TERM GOAL  #9   Status --  pt is now stepping over ledge and into shower wtih min with wife to sit on seat   OT SHORT TERM GOAL  #11   TITLE Pt and wife will be mod I with upgraded HEP - 05/23/2015 (date adjusted as pt missed 2 weeks of therapy)   Status Achieved   OT SHORT  TERM GOAL  #12   TITLE Pt will be supevision for simple hot meal prep   Status Achieved  05/25/2015   OT SHORT TERM GOAL  #13   TITLE Pt will demonstrate ability for bilateral mid reach with light object without facilitation.   Status Achieved           OT Long Term Goals - 08/10/15 1619    OT LONG TERM GOAL #8   Title Pt will demonstrate the ability to use LUE as gross a for basic ADL's 100% of the time - 09/01/2015 with renewal   Status On-going  able to use for bathing   OT LONG TERM GOAL  #10   TITLE Pt will be mod I for simple hot meal prep   Status Achieved   OT LONG TERM GOAL  #11   TITLE Pt will demonstrate ability for functional tasks at ambulatory level for 10 minutes without needing rest break.   Status Achieved  with min facilitation and min compensations   OT LONG TERM GOAL  #12   TITLE Pt will demonstrate  ability to release objects with mod facilitation. 09/01/2015 with renewal   Status On-going   OT LONG TERM GOAL  #13   TITLE Pt will demonstrate ability for unilateral low reach with assist for gross grasp with mn compensations.   Status Achieved   OT LONG TERM GOAL  #14   TITLE Pt will be mod I with upgraded HEP prn   Status Achieved               Plan - 08/10/15 1619    Clinical Impression Statement Pt is making slow progress toward goals.  Pt requires max repetition and facilitation for release of R hand for functional release.    Pt will benefit from skilled therapeutic intervention in order to improve on the following deficits (Retired) Decreased balance;Decreased coordination;Decreased cognition;Decreased knowledge of use of DME;Decreased mobility;Decreased range of motion;Decreased safety awareness;Difficulty walking;Decreased strength;Increased edema;Increased muscle spasms;Impaired UE functional use;Impaired tone;Impaired sensation;Impaired vision/preception;Pain   Rehab Potential Good   Clinical Impairments Affecting Rehab Potential apraxia, sensory loss,perceptual deficits, cognitive impairment   OT Frequency 2x / week   OT Duration 4 weeks   OT Treatment/Interventions Self-care/ADL training;Cryotherapy;Moist Heat;Electrical Stimulation;Fluidtherapy;DME and/or AE instruction;Energy conservation;Neuromuscular education;Therapeutic exercise;Functional Mobility Training;Manual Therapy;Passive range of motion;Splinting;Therapeutic activities;Balance training;Patient/family education;Cognitive remediation/compensation   Plan update HEP prn, estim for finger and wrist extension, facilitation of isolated mvment in hand.    Consulted and Agree with Plan of Care Patient;Family member/caregiver   Family Member Consulted wife        Problem List Patient Active Problem List   Diagnosis Date Noted  . 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 Spastic hemiparesis affecting  nondominant side ([facility] [place]) 11/08/2014  . Hypothyroidism 10/27/2014  . Left-sided neglect 09/23/2014  . Acute ischemic right MCA stroke ([facility]) 09/16/2014  . Left hemiparesis ([place])   . CVA (cerebral vascular accident) ([place])   . Right carotid artery occlusion   . Acute right MCA stroke ([place])   . Essential hypertension 09/12/2014  . CVA (cerebral infarction) 09/12/2014  . Type 2 diabetes mellitus with peripheral neuropathy ([place]) 09/12/2014    Quay Burow, OTR/L 08/10/2015, 4:42 PM  [facility] [address] [place], [place], [address] Phone: [phone]   Fax:  [phone]  Name: Brian Hall MRN: [id number] Date of Birth: 07/27/1956

## 2015-08-14 ENCOUNTER — Ambulatory Visit: Payer: 59 | Admitting: Physical Medicine & Rehabilitation

## 2015-08-14 ENCOUNTER — Ambulatory Visit: Payer: 59 | Admitting: Occupational Therapy

## 2015-08-14 ENCOUNTER — Encounter: Payer: Self-pay | Admitting: Occupational Therapy

## 2015-08-14 DIAGNOSIS — G8194 Hemiplegia, unspecified affecting left nondominant side: Secondary | ICD-10-CM | POA: Diagnosis not present

## 2015-08-14 DIAGNOSIS — R414 Neurologic neglect syndrome: Secondary | ICD-10-CM

## 2015-08-14 DIAGNOSIS — R201 Hypoesthesia of skin: Secondary | ICD-10-CM

## 2015-08-14 DIAGNOSIS — R4189 Other symptoms and signs involving cognitive functions and awareness: Secondary | ICD-10-CM

## 2015-08-14 DIAGNOSIS — Z7409 Other reduced mobility: Secondary | ICD-10-CM

## 2015-08-14 NOTE — Therapy (Signed)
[facility] [address] [place], [place], [address] Phone: [phone]   Fax:  [phone]  Occupational Therapy Treatment  Patient Details  Name: Brian Hall MRN: [id number] Date of Birth: November 14, 1955 No Data Recorded  Encounter Date: 08/14/2015      OT End of Session - 08/14/15 1708    Visit Number 50   Number of Visits 42   Date for OT Re-Evaluation 09/01/15   Authorization Type [employer]   OT Start Time [id number]   OT Stop Time 1400   OT Time Calculation (min) 43 min   Activity Tolerance Patient tolerated treatment well      Past Medical History  Diagnosis Date  . Hypertension   . Diabetes mellitus   . Obesity   . CHF (congestive heart failure) ([facility])   . SOB (shortness of breath)   . Hyperkalemia   . Renal insufficiency   . Cardiogenic shock ([place])   . Cardiac LV ejection fraction 10-20%   . Hyperlipemia   . Hypothyroidism   . Throat cancer ([facility]) 2011    s/p neck dissection, radiation, chemo  . Stroke ([facility] [facility])   . Blurred vision   . Joint pain   . Joint swelling     Past Surgical History  Procedure Laterality Date  . Cardiac catheterization    . Neck dissection Right 2011    s/p resection of throat cancer with multiple nodes removed    There were no vitals filed for this visit.  Visit Diagnosis:  Hemiplegia affecting left nondominant side ([facility])  Impaired functional mobility and activity tolerance  Impaired sensation  Impaired cognition  Left-sided neglect      Subjective Assessment - 08/14/15 1324    Subjective  I always try and be as independent as possible.    Patient is accompained by: Family member  wife   Pertinent History see epic snapshot   Patient Stated Goals Get back to doing some of the things I used to do.   Currently in Pain? No/denies                      OT Treatments/Exercises (OP) - 08/14/15 0001    Neurological Re-education Exercises   Other  Exercises 1 Neuro re ed after e stim to address wrist extension and finger extension for active grasp and release as well as grading of grasp. Also addressed grading of grasp while sliding objects on table in different directions.  Pt requires max facilitation to relax or "turn off" hand in preparation for active release of fingers. E stim assists with decreasing tone and stifffness however pt with only trace movement in thumb. Attempting to teach pt to "turn off" hand and use wrist flexion in conjunction to achieve functional release.    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 L wrist and fingers   Electrical Stimulation Action wrist and finger extension   Electrical Stimulation Parameters 250pw, 50pps, 10 sec cycle, 2 sec ramp, intensity 53   Electrical Stimulation Goals Tone;Neuromuscular facilitation                  OT Short Term Goals - 08/14/15 1506    OT SHORT TERM GOAL  #9   Status --  pt is now stepping over ledge and into shower wtih min with wife to sit on seat   OT SHORT TERM GOAL  #11   TITLE Pt and wife  will be mod I with upgraded HEP - 05/23/2015 (date adjusted as pt missed 2 weeks of therapy)   Status Achieved   OT SHORT TERM GOAL  #12   TITLE Pt will be supevision for simple hot meal prep   Status Achieved  05/25/2015   OT SHORT TERM GOAL  #13   TITLE Pt will demonstrate ability for bilateral mid reach with light object without facilitation.   Status Achieved           OT Long Term Goals - 08/14/15 1506    OT LONG TERM GOAL #8   Title Pt will demonstrate the ability to use LUE as gross a for basic ADL's 100% of the time - 09/01/2015 with renewal   Status On-going  able to use for bathing   OT LONG TERM GOAL  #10   TITLE Pt will be mod I for simple hot meal prep   Status Achieved   OT LONG TERM GOAL  #11   TITLE Pt will demonstrate ability for functional tasks at ambulatory level for 10  minutes without needing rest break.   Status Achieved  with min facilitation and min compensations   OT LONG TERM GOAL  #12   TITLE Pt will demonstrate ability to release objects with mod facilitation. 09/01/2015 with renewal   Status On-going   OT LONG TERM GOAL  #13   TITLE Pt will demonstrate ability for unilateral low reach with assist for gross grasp with mn compensations.   Status Achieved   OT LONG TERM GOAL  #14   TITLE Pt will be mod I with upgraded HEP prn   Status Achieved               Plan - 08/14/15 1507    Clinical Impression Statement Pt with slow progress toward goals. Pt requires max repetition for motor learning.    Pt will benefit from skilled therapeutic intervention in order to improve on the following deficits (Retired) Decreased balance;Decreased coordination;Decreased cognition;Decreased knowledge of use of DME;Decreased mobility;Decreased range of motion;Decreased safety awareness;Difficulty walking;Decreased strength;Increased edema;Increased muscle spasms;Impaired UE functional use;Impaired tone;Impaired sensation;Impaired vision/preception;Pain   Rehab Potential Good   Clinical Impairments Affecting Rehab Potential apraxia, sensory loss,perceptual deficits, cognitive impairment   OT Frequency 2x / week   OT Duration 4 weeks   OT Treatment/Interventions Self-care/ADL training;Cryotherapy;Moist Heat;Electrical Stimulation;Fluidtherapy;DME and/or AE instruction;Energy conservation;Neuromuscular education;Therapeutic exercise;Functional Mobility Training;Manual Therapy;Passive range of motion;Splinting;Therapeutic activities;Balance training;Patient/family education;Cognitive remediation/compensation   Plan progress HEP prn, estim for finger and wrist extension, facilitation of isolated movement in hand   Consulted and Agree with Plan of Care Patient;Family member/caregiver   Family Member Consulted wife        Problem List Patient Active Problem List    Diagnosis Date Noted  . 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 Spastic hemiparesis affecting nondominant side ([facility]) 11/08/2014  . Hypothyroidism 10/27/2014  . Left-sided neglect 09/23/2014  . Acute ischemic right MCA stroke ([facility]) 09/16/2014  . Left hemiparesis ([facility])   . CVA (cerebral vascular accident) ([facility])   . Right carotid artery occlusion   . Acute right MCA stroke ([facility])   . Essential hypertension 09/12/2014  . CVA (cerebral infarction) 09/12/2014  . Type 2 diabetes mellitus with peripheral neuropathy ([place]) 09/12/2014    Quay Burow, OTR/L 08/14/2015, 5:11 PM  [facility] [address] [place], [place], [address] Phone: [phone]   Fax:  [phone]  Name: Brian Hall MRN:  [id number] Date of Birth: 09/30/1955

## 2015-08-17 ENCOUNTER — Ambulatory Visit: Payer: 59 | Admitting: Occupational Therapy

## 2015-08-17 ENCOUNTER — Encounter: Payer: Self-pay | Admitting: Occupational Therapy

## 2015-08-17 DIAGNOSIS — R4189 Other symptoms and signs involving cognitive functions and awareness: Secondary | ICD-10-CM

## 2015-08-17 DIAGNOSIS — G8194 Hemiplegia, unspecified affecting left nondominant side: Secondary | ICD-10-CM | POA: Diagnosis not present

## 2015-08-17 DIAGNOSIS — Z7409 Other reduced mobility: Secondary | ICD-10-CM

## 2015-08-17 DIAGNOSIS — R201 Hypoesthesia of skin: Secondary | ICD-10-CM

## 2015-08-17 DIAGNOSIS — R414 Neurologic neglect syndrome: Secondary | ICD-10-CM

## 2015-08-17 NOTE — Therapy (Signed)
[facility] [address] [place], [place], [address] Phone: [phone]   Fax:  [phone]  Occupational Therapy Treatment  Patient Details  Name: Brian Hall MRN: [id number] Date of Birth: [birth year] No Data Recorded  Encounter Date: 08/17/2015      OT End of Session - 08/17/15 1411    Visit Number 80   Number of Visits 47   Date for OT Re-Evaluation 09/01/15   Authorization Type [employer]   OT Start Time 1315   OT Stop Time 1401   OT Time Calculation (min) 46 min   Activity Tolerance Patient tolerated treatment well      Past Medical History  Diagnosis Date  . Hypertension   . Diabetes mellitus   . Obesity   . CHF (congestive heart failure) ([facility])   . SOB (shortness of breath)   . Hyperkalemia   . Renal insufficiency   . Cardiogenic shock ([place])   . Cardiac LV ejection fraction 10-20%   . Hyperlipemia   . Hypothyroidism   . Throat cancer ([facility]) 2011    s/p neck dissection, radiation, chemo  . Stroke ([place])   . Blurred vision   . Joint pain   . Joint swelling     Past Surgical History  Procedure Laterality Date  . Cardiac catheterization    . Neck dissection Right 2011    s/p resection of throat cancer with multiple nodes removed    There were no vitals filed for this visit.  Visit Diagnosis:  Hemiplegia affecting left nondominant side (HCC)  Impaired functional mobility and activity tolerance  Impaired sensation  Impaired cognition  Left-sided neglect      Subjective Assessment - 08/17/15 1322    Patient is accompained by: Family member  wife   Pertinent History see epic snapshot   Patient Stated Goals Get back to doing some of the things I used to do.   Currently in Pain? No/denies                      OT Treatments/Exercises (OP) - 08/17/15 0001    Neurological Re-education Exercises   Other Exercises 1 Neuro re ed following e stim to facilitate functional  release in L hand. Facilitation to hold middle, ring and pink in extension in order to address 2 point pinch and prevent full flexor tone blocking release. Pt today for first time demonstrated some isolated thumb movement and with some tenodesis was able to release small object from 2 point pinch. This improved with repetition. Pt requires max vc's for grading of force behind hand as well as cues to vision to grade force. Pt is improving in ability to grade amount of finger flexion to task.    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 L wrist and fingers   Electrical Stimulation Action wrist and finger extension   Electrical Stimulation Parameters 250 pw, 50 pps, 10 sec cycle, 2 sec ramp, intensity 48   Electrical Stimulation Goals Tone;Neuromuscular facilitation                  OT Short Term Goals - 08/17/15 1408    OT SHORT TERM GOAL  #9   Status --  pt is now stepping over ledge and into shower wtih min with wife to sit on seat   OT SHORT TERM GOAL  #11   TITLE Pt and wife will be mod I with  upgraded HEP - 05/23/2015 (date adjusted as pt missed 2 weeks of therapy)   Status Achieved   OT SHORT TERM GOAL  #12   TITLE Pt will be supevision for simple hot meal prep   Status Achieved  05/25/2015   OT SHORT TERM GOAL  #13   TITLE Pt will demonstrate ability for bilateral mid reach with light object without facilitation.   Status Achieved           OT Long Term Goals - 08/17/15 1408    OT LONG TERM GOAL #8   Title Pt will demonstrate the ability to use LUE as gross a for basic ADL's 100% of the time - 09/01/2015 with renewal   Status Not Met  able to use for bathing   OT LONG TERM GOAL  #10   TITLE Pt will be mod I for simple hot meal prep   Status Achieved   OT LONG TERM GOAL  #11   TITLE Pt will demonstrate ability for functional tasks at ambulatory level for 10 minutes without needing rest break.   Status  Achieved  with min facilitation and min compensations   OT LONG TERM GOAL  #12   TITLE Pt will demonstrate ability to release objects with mod facilitation. 09/01/2015 with renewal   Status On-going   OT LONG TERM GOAL  #13   TITLE Pt will demonstrate ability for unilateral low reach with assist for gross grasp with mn compensations.   Status Achieved   OT LONG TERM GOAL  #14   TITLE Pt will be mod I with upgraded HEP prn   Status Achieved               Plan - 08/17/15 1409    Clinical Impression Statement Pt making very slow progress toward last LTG.  Despite botox injection, pt remains with signficant flexor tone in RUE especially in R hand. Pt and wife report pt is using R hand as stabilizer at home for various activities.         Problem List Patient Active Problem List   Diagnosis Date Noted  . 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 Spastic hemiparesis affecting nondominant side ([facility]) 11/08/2014  . Hypothyroidism 10/27/2014  . Left-sided neglect 09/23/2014  . Acute ischemic right MCA stroke ([facility]) 09/16/2014  . Left hemiparesis ([place])   . CVA (cerebral vascular accident) ([facility])   . Right carotid artery occlusion   . Acute right MCA stroke ([place])   . Essential hypertension 09/12/2014  . CVA (cerebral infarction) 09/12/2014  . Type 2 diabetes mellitus with peripheral neuropathy ([facility]) 09/12/2014    Quay Burow, OTR/L 08/17/2015, 2:12 PM  [facility] [address] [place], [place], [address] Phone: [phone]   Fax:  [phone]  Name: JAMESEN STAHNKE MRN: [id number] Date of Birth: September 10, 1955

## 2015-08-21 ENCOUNTER — Encounter: Payer: Self-pay | Admitting: Occupational Therapy

## 2015-08-21 ENCOUNTER — Ambulatory Visit: Payer: 59 | Admitting: Occupational Therapy

## 2015-08-21 DIAGNOSIS — R4189 Other symptoms and signs involving cognitive functions and awareness: Secondary | ICD-10-CM

## 2015-08-21 DIAGNOSIS — Z7409 Other reduced mobility: Secondary | ICD-10-CM

## 2015-08-21 DIAGNOSIS — R414 Neurologic neglect syndrome: Secondary | ICD-10-CM

## 2015-08-21 DIAGNOSIS — G8194 Hemiplegia, unspecified affecting left nondominant side: Secondary | ICD-10-CM | POA: Diagnosis not present

## 2015-08-21 DIAGNOSIS — R201 Hypoesthesia of skin: Secondary | ICD-10-CM

## 2015-08-21 NOTE — Therapy (Signed)
[facility] [address] [place], [place], [address] Phone: [phone]   Fax:  [phone]  Occupational Therapy Treatment  Patient Details  Name: Brian Hall MRN: [id number] Date of Birth: 10-20-1955 No Data Recorded  Encounter Date: 08/21/2015      OT End of Session - 08/21/15 1725    Visit Number 55   Number of Visits 35   Date for OT Re-Evaluation 09/01/15   Authorization Type [employer]   OT Start Time 1315   OT Stop Time 1359   OT Time Calculation (min) 44 min   Activity Tolerance Patient tolerated treatment well      Past Medical History  Diagnosis Date  . Hypertension   . Diabetes mellitus   . Obesity   . CHF (congestive heart failure) ([facility])   . SOB (shortness of breath)   . Hyperkalemia   . Renal insufficiency   . Cardiogenic shock ([facility])   . Cardiac LV ejection fraction 10-20%   . Hyperlipemia   . Hypothyroidism   . Throat cancer ([facility]) 2011    s/p neck dissection, radiation, chemo  . Stroke ([facility])   . Blurred vision   . Joint pain   . Joint swelling     Past Surgical History  Procedure Laterality Date  . Cardiac catheterization    . Neck dissection Right 2011    s/p resection of throat cancer with multiple nodes removed    There were no vitals filed for this visit.  Visit Diagnosis:  Hemiplegia affecting left nondominant side (HCC)  Impaired functional mobility and activity tolerance  Impaired sensation  Impaired cognition  Left-sided neglect      Subjective Assessment - 08/21/15 1320    Subjective  I can use my L hand to hold my phone when I check messages or plug it in   Patient is accompained by: Family member  wife   Pertinent History see epic snapshot   Patient Stated Goals Get back to doing some of the things I used to do.   Currently in Pain? Yes   Pain Score 1    Pain Location Ankle   Pain Orientation Left   Pain Descriptors / Indicators Aching   Pain Type  Chronic pain   Pain Onset More than a month ago   Pain Frequency Intermittent   Aggravating Factors  walking too much   Pain Relieving Factors resting                      OT Treatments/Exercises (OP) - 08/21/15 0001    Neurological Re-education Exercises   Other Exercises 1 Neuro re ed following estim to address graded grasp and release - pt with continued small gains in isolated thumb movement especially in adduction and dowward movement.  Pt continues to improve in ability to grade grasp which in turn assists him functionally in his ability to "turn off" his hand in preparation for releasing objects. Pt continues to require max facilitation for release.  Pt benefits from significant repetition due to cognitive impairment, sensory impairment and apraxia.    Acupuncturist Location L wrist and fingers   Electrical Stimulation Action wrist and finger extension   Electrical Stimulation Parameters 250 pw, 50 pps,10 sec cycle, 2 sec ramp, intensity 37   Electrical Stimulation Goals Tone;Neuromuscular facilitation                  OT Short Term Goals -  08/21/15 1723    OT SHORT TERM GOAL  #9   Status --  pt is now stepping over ledge and into shower wtih min with wife to sit on seat   OT SHORT TERM GOAL  #11   TITLE Pt and wife will be mod I with upgraded HEP - 05/23/2015 (date adjusted as pt missed 2 weeks of therapy)   Status Achieved   OT SHORT TERM GOAL  #12   TITLE Pt will be supevision for simple hot meal prep   Status Achieved  05/25/2015   OT SHORT TERM GOAL  #13   TITLE Pt will demonstrate ability for bilateral mid reach with light object without facilitation.   Status Achieved           OT Long Term Goals - 08/21/15 1723    OT LONG TERM GOAL #8   Title Pt will demonstrate the ability to use LUE as gross a for basic ADL's 100% of the time - 09/01/2015 with renewal   Status Not Met  able to use for bathing   OT  LONG TERM GOAL  #10   TITLE Pt will be mod I for simple hot meal prep   Status Achieved   OT LONG TERM GOAL  #11   TITLE Pt will demonstrate ability for functional tasks at ambulatory level for 10 minutes without needing rest break.   Status Achieved  with min facilitation and min compensations   OT LONG TERM GOAL  #12   TITLE Pt will demonstrate ability to release objects with mod facilitation. 09/01/2015 with renewal   Status On-going   OT LONG TERM GOAL  #13   TITLE Pt will demonstrate ability for unilateral low reach with assist for gross grasp with mn compensations.   Status Achieved   OT LONG TERM GOAL  #14   TITLE Pt will be mod I with upgraded HEP prn   Status Achieved               Plan - 08/21/15 1723    Clinical Impression Statement Pt with very slow progress toward last LTG - at times pt is able to release object with mod facilitation however this is not yet consistent.   Pt will benefit from skilled therapeutic intervention in order to improve on the following deficits (Retired) Decreased balance;Decreased coordination;Decreased cognition;Decreased knowledge of use of DME;Decreased mobility;Decreased range of motion;Decreased safety awareness;Difficulty walking;Decreased strength;Increased edema;Increased muscle spasms;Impaired UE functional use;Impaired tone;Impaired sensation;Impaired vision/preception;Pain   Rehab Potential Good   Clinical Impairments Affecting Rehab Potential apraxia, sensory loss,perceptual deficits, cognitive impairment   OT Frequency 2x / week   OT Duration 4 weeks   OT Treatment/Interventions Self-care/ADL training;Cryotherapy;Moist Heat;Electrical Stimulation;Fluidtherapy;DME and/or AE instruction;Energy conservation;Neuromuscular education;Therapeutic exercise;Functional Mobility Training;Manual Therapy;Passive range of motion;Splinting;Therapeutic activities;Balance training;Patient/family education;Cognitive remediation/compensation   Plan  estim, facilitation of isolated movement in L hand as well as graded grasp and release   Consulted and Agree with Plan of Care Patient;Family member/caregiver   Family Member Consulted wife        Problem List Patient Active Problem List   Diagnosis Date Noted  . 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 Spastic hemiparesis affecting nondominant side ([facility]) 11/08/2014  . Hypothyroidism 10/27/2014  . Left-sided neglect 09/23/2014  . Acute ischemic right MCA stroke ([facility]) 09/16/2014  . Left hemiparesis ([facility])   . CVA (cerebral vascular accident) ([facility])   . Right carotid artery occlusion   . Acute right MCA stroke ([facility])   .  Essential hypertension 09/12/2014  . CVA (cerebral infarction) 09/12/2014  . Type 2 diabetes mellitus with peripheral neuropathy ([facility]) 09/12/2014    Quay Burow, OTR/L 08/21/2015, 5:26 PM  [facility] [address] [place], [place], [address] Phone: [phone]   Fax:  [phone]  Name: Brian Hall MRN: [id number] Date of Birth: 1955/12/12

## 2015-08-24 ENCOUNTER — Ambulatory Visit: Payer: 59 | Admitting: Occupational Therapy

## 2015-08-24 ENCOUNTER — Encounter: Payer: Self-pay | Admitting: Occupational Therapy

## 2015-08-24 DIAGNOSIS — G8194 Hemiplegia, unspecified affecting left nondominant side: Secondary | ICD-10-CM | POA: Diagnosis not present

## 2015-08-24 DIAGNOSIS — R4189 Other symptoms and signs involving cognitive functions and awareness: Secondary | ICD-10-CM

## 2015-08-24 DIAGNOSIS — R414 Neurologic neglect syndrome: Secondary | ICD-10-CM

## 2015-08-24 DIAGNOSIS — Z7409 Other reduced mobility: Secondary | ICD-10-CM

## 2015-08-24 DIAGNOSIS — R201 Hypoesthesia of skin: Secondary | ICD-10-CM

## 2015-08-24 NOTE — Therapy (Signed)
[facility] [address] [place], [place], [address] Phone: [phone]   Fax:  [phone]  Occupational Therapy Treatment  Patient Details  Name: Brian Hall MRN: [id number] Date of Birth: Mar 06, 1956 No Data Recorded  Encounter Date: 08/24/2015      OT End of Session - 08/24/15 1456    Visit Number 77   Number of Visits 71   Date for OT Re-Evaluation 09/01/15   Authorization Type [employer]   OT Start Time 1315   OT Stop Time 1405   OT Time Calculation (min) 50 min   Activity Tolerance Patient tolerated treatment well      Past Medical History  Diagnosis Date  . Hypertension   . Diabetes mellitus   . Obesity   . CHF (congestive heart failure) ([facility])   . SOB (shortness of breath)   . Hyperkalemia   . Renal insufficiency   . Cardiogenic shock ([facility])   . Cardiac LV ejection fraction 10-20%   . Hyperlipemia   . Hypothyroidism   . Throat cancer ([facility]) 2011    s/p neck dissection, radiation, chemo  . Stroke ([facility])   . Blurred vision   . Joint pain   . Joint swelling     Past Surgical History  Procedure Laterality Date  . Cardiac catheterization    . Neck dissection Right 2011    s/p resection of throat cancer with multiple nodes removed    There were no vitals filed for this visit.  Visit Diagnosis:  Hemiplegia affecting left nondominant side (HCC)  Impaired functional mobility and activity tolerance  Impaired sensation  Impaired cognition  Left-sided neglect      Subjective Assessment - 08/24/15 1322    Subjective  I feel pretty good today   Patient is accompained by: Family member  wife   Pertinent History see epic snapshot   Patient Stated Goals Get back to doing some of the things I used to do.   Currently in Pain? No/denies                      OT Treatments/Exercises (OP) - 08/24/15 0001    Neurological Re-education Exercises   Other Exercises 1 Following estim,  neuro re ed to address graded grasp and relase of small cylindrical objects. For the first time, pt was able to grasp object and actively release object into container.  Pt abel to do multiple times.  Pt with clearly visible beginning active extension of all fingers in functional task.  Pt only required min facilitation to assist with weight of the arm in reach to inhibit flexor tone and did not require any hands on facilitation to release object.     Acupuncturist Location L wrist and fingers   Electrical Stimulation Action wrist and finger extension   Electrical Stimulation Parameters 250pw, 50pps, 10 sec cycle, 2 sec ramp, intensity 32-49   Electrical Stimulation Goals Tone;Neuromuscular facilitation                  OT Short Term Goals - 08/24/15 1454    OT SHORT TERM GOAL  #9   Status --  pt is now stepping over ledge and into shower wtih min with wife to sit on seat   OT SHORT TERM GOAL  #11   TITLE Pt and wife will be mod I with upgraded HEP - 05/23/2015 (date adjusted as pt missed 2 weeks of therapy)  Status Achieved   OT SHORT TERM GOAL  #12   TITLE Pt will be supevision for simple hot meal prep   Status Achieved  05/25/2015   OT SHORT TERM GOAL  #13   TITLE Pt will demonstrate ability for bilateral mid reach with light object without facilitation.   Status Achieved           OT Long Term Goals - 08/24/15 1454    OT LONG TERM GOAL #8   Title Pt will demonstrate the ability to use LUE as gross a for basic ADL's 100% of the time - 09/01/2015 with renewal   Status Not Met  able to use for bathing   OT LONG TERM GOAL  #10   TITLE Pt will be mod I for simple hot meal prep   Status Achieved   OT LONG TERM GOAL  #11   TITLE Pt will demonstrate ability for functional tasks at ambulatory level for 10 minutes without needing rest break.   Status Achieved  with min facilitation and min compensations   OT LONG TERM GOAL  #12   TITLE Pt  will demonstrate ability to release objects with mod facilitation. 09/01/2015 with renewal   Status On-going   OT LONG TERM GOAL  #13   TITLE Pt will demonstrate ability for unilateral low reach with assist for gross grasp with mn compensations.   Status Achieved   OT LONG TERM GOAL  #14   TITLE Pt will be mod I with upgraded HEP prn   Status Achieved               Plan - 08/24/15 1455    Clinical Impression Statement Pt with very slow but steady progress toward last LTG - pt able to actively release small cylindrical object today for the first time.    Pt will benefit from skilled therapeutic intervention in order to improve on the following deficits (Retired) Decreased balance;Decreased coordination;Decreased cognition;Decreased knowledge of use of DME;Decreased mobility;Decreased range of motion;Decreased safety awareness;Difficulty walking;Decreased strength;Increased edema;Increased muscle spasms;Impaired UE functional use;Impaired tone;Impaired sensation;Impaired vision/preception;Pain   Rehab Potential Good   Clinical Impairments Affecting Rehab Potential apraxia, sensory loss,perceptual deficits, cognitive impairment   OT Frequency 2x / week   OT Duration 4 weeks   OT Treatment/Interventions Self-care/ADL training;Cryotherapy;Moist Heat;Electrical Stimulation;Fluidtherapy;DME and/or AE instruction;Energy conservation;Neuromuscular education;Therapeutic exercise;Functional Mobility Training;Manual Therapy;Passive range of motion;Splinting;Therapeutic activities;Balance training;Patient/family education;Cognitive remediation/compensation   Plan estim, facilitation of isolaged  movement in L hand as well as conitnued focus on graded grasp and release in different functional tasks.    OT Home Exercise Plan upgraded HEP to include place and hold   Consulted and Agree with Plan of Care Patient;Family member/caregiver   Family Member Consulted wife        Problem List Patient  Active Problem List   Diagnosis Date Noted  . 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 Spastic hemiparesis affecting nondominant side ([facility]) 11/08/2014  . Hypothyroidism 10/27/2014  . Left-sided neglect 09/23/2014  . Acute ischemic right MCA stroke ([facility]) 09/16/2014  . Left hemiparesis ([facility])   . CVA (cerebral vascular accident) ([facility])   . Right carotid artery occlusion   . Acute right MCA stroke ([facility])   . Essential hypertension 09/12/2014  . CVA (cerebral infarction) 09/12/2014  . Type 2 diabetes mellitus with peripheral neuropathy (HCC) 09/12/2014    Quay Burow, OTR/L 08/24/2015, 2:58 PM  [facility] [address] [place], [place],  [address] Phone: [phone]   Fax:  [phone]  Name: Brian Hall MRN: [id number] Date of Birth: Apr 03, 1956

## 2015-08-31 ENCOUNTER — Ambulatory Visit: Payer: 59 | Admitting: Occupational Therapy

## 2015-08-31 ENCOUNTER — Encounter: Payer: Self-pay | Admitting: Occupational Therapy

## 2015-08-31 DIAGNOSIS — R201 Hypoesthesia of skin: Secondary | ICD-10-CM

## 2015-08-31 DIAGNOSIS — Z7409 Other reduced mobility: Secondary | ICD-10-CM

## 2015-08-31 DIAGNOSIS — G8194 Hemiplegia, unspecified affecting left nondominant side: Secondary | ICD-10-CM

## 2015-08-31 DIAGNOSIS — R414 Neurologic neglect syndrome: Secondary | ICD-10-CM

## 2015-08-31 DIAGNOSIS — R4189 Other symptoms and signs involving cognitive functions and awareness: Secondary | ICD-10-CM

## 2015-08-31 NOTE — Therapy (Signed)
[facility] [address] [place], [place], [address] Phone: [phone]   Fax:  [phone]  Occupational Therapy Treatment  Patient Details  Name: Brian Hall MRN: [id number] Date of Birth: [birth year] No Data Recorded  Encounter Date: 08/31/2015      OT End of Session - 08/31/15 1516    Visit Number 63   Number of Visits 70   Date for OT Re-Evaluation 09/01/15   Authorization Type [employer]   OT Start Time 1316   OT Stop Time 1401   OT Time Calculation (min) 45 min   Activity Tolerance Patient tolerated treatment well      Past Medical History  Diagnosis Date  . Hypertension   . Diabetes mellitus   . Obesity   . CHF (congestive heart failure) ([place])   . SOB (shortness of breath)   . Hyperkalemia   . Renal insufficiency   . Cardiogenic shock ([place])   . Cardiac LV ejection fraction 10-20%   . Hyperlipemia   . Hypothyroidism   . Throat cancer ([place]) 2011    s/p neck dissection, radiation, chemo  . Stroke ([place])   . Blurred vision   . Joint pain   . Joint swelling     Past Surgical History  Procedure Laterality Date  . Cardiac catheterization    . Neck dissection Right 2011    s/p resection of throat cancer with multiple nodes removed    There were no vitals filed for this visit.  Visit Diagnosis:  Hemiplegia affecting left nondominant side (HCC)  Impaired functional mobility and activity tolerance  Impaired sensation  Impaired cognition  Left-sided neglect      Subjective Assessment - 08/31/15 1324    Patient is accompained by: Family member  wife   Pertinent History see epic snapshot   Patient Stated Goals Get back to doing some of the things I used to do.   Currently in Pain? Yes   Pain Score 1    Pain Location Wrist   Pain Orientation Left   Pain Descriptors / Indicators Tingling;Sore   Pain Type Acute pain   Pain Onset Yesterday   Pain Frequency Intermittent   Aggravating  Factors  not sure   Pain Relieving Factors not sure it's not that bad                      OT Treatments/Exercises (OP) - 08/31/15 0001    Neurological Re-education Exercises   Other Exercises 1 Following e stim neuro re ed to address simple grasp and release task with facilitation to support weight of LUE to prevent increased tone that interferes with hand function.  Focus today was replicating active release from last session (pt was successful with this) as well as to teach pt's wife how to set up activity and facilitate at home. With demonstation, hands practice and repetiion pt and wife able to replicate activity.  Also discussed possible purchase of home e stim unit -pt and wife to discuss at home.    Acupuncturist Location L wrist and fingers   Electrical Stimulation Action wrist and finger extension   Electrical Stimulation Parameters 250pw, 50pps, 10 sec cycle, 2 sec ramp, intensity 33-50. Pt tolerated well.   Electrical Stimulation Goals Tone;Pain;Neuromuscular facilitation  Pt pain decreased to 0/10                  OT Short Term Goals - 08/31/15 1513  OT SHORT TERM GOAL  #9   Status --  pt is now stepping over ledge and into shower wtih min with wife to sit on seat   OT SHORT TERM GOAL  #11   TITLE Pt and wife will be mod I with upgraded HEP - 05/23/2015 (date adjusted as pt missed 2 weeks of therapy)   Status Achieved   OT SHORT TERM GOAL  #12   TITLE Pt will be supevision for simple hot meal prep   Status Achieved  05/25/2015   OT SHORT TERM GOAL  #13   TITLE Pt will demonstrate ability for bilateral mid reach with light object without facilitation.   Status Achieved           OT Long Term Goals - 08/31/15 1514    OT LONG TERM GOAL #8   Title Pt will demonstrate the ability to use LUE as gross a for basic ADL's 100% of the time - 09/01/2015 with renewal   Status Not Met  able to use for bathing   OT LONG  TERM GOAL  #10   TITLE Pt will be mod I for simple hot meal prep   Status Achieved   OT LONG TERM GOAL  #11   TITLE Pt will demonstrate ability for functional tasks at ambulatory level for 10 minutes without needing rest break.   Status Achieved  with min facilitation and min compensations   OT LONG TERM GOAL  #12   TITLE Pt will demonstrate ability to release objects with mod facilitation. 09/01/2015 with renewal   Status On-going   OT LONG TERM GOAL  #13   TITLE Pt will demonstrate ability for unilateral low reach with assist for gross grasp with mn compensations.   Status Achieved   OT LONG TERM GOAL  #14   TITLE Pt will be mod I with upgraded HEP prn   Status Achieved               Plan - 08/31/15 1514    Clinical Impression Statement Pt continues with slow progress toward goals.  Pt and wife learning additional activities for HEP as well as discussion of possible home NMES unit in preparation of discharge.   Pt will benefit from skilled therapeutic intervention in order to improve on the following deficits (Retired) Decreased balance;Decreased coordination;Decreased cognition;Decreased knowledge of use of DME;Decreased mobility;Decreased range of motion;Decreased safety awareness;Difficulty walking;Decreased strength;Increased edema;Increased muscle spasms;Impaired UE functional use;Impaired tone;Impaired sensation;Impaired vision/preception;Pain   Rehab Potential Good   Clinical Impairments Affecting Rehab Potential apraxia, sensory loss,perceptual deficits, cognitive impairment   OT Frequency 2x / week   OT Duration 4 weeks   OT Treatment/Interventions Self-care/ADL training;Cryotherapy;Moist Heat;Electrical Stimulation;Fluidtherapy;DME and/or AE instruction;Energy conservation;Neuromuscular education;Therapeutic exercise;Functional Mobility Training;Manual Therapy;Passive range of motion;Splinting;Therapeutic activities;Balance training;Patient/family education;Cognitive  remediation/compensation   Plan finalize goals, e stim, review facilitation/tasks with pt and wife   OT Home Exercise Plan upgraded HEP to include place and hold   Consulted and Agree with Plan of Care Patient;Family member/caregiver   Family Member Consulted wife        Problem List Patient Active Problem List   Diagnosis Date Noted  . 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 Spastic hemiparesis affecting nondominant side ([place]) 11/08/2014  . Hypothyroidism 10/27/2014  . Left-sided neglect 09/23/2014  . Acute ischemic right MCA stroke ([place]) 09/16/2014  . Left hemiparesis ([place])   . CVA (cerebral vascular accident) ([place])   . Right carotid artery occlusion   .  Acute right MCA stroke ([place])   . Essential hypertension 09/12/2014  . CVA (cerebral infarction) 09/12/2014  . Type 2 diabetes mellitus with peripheral neuropathy ([place]) 09/12/2014    Quay Burow, OTR/L 08/31/2015, 3:17 PM  [facility] [address] [place], [place], [address] Phone: [phone]   Fax:  [phone]  Name: CORWYN VORA MRN: [id number] Date of Birth: 1956-04-26

## 2015-09-01 ENCOUNTER — Ambulatory Visit: Payer: 59 | Admitting: Occupational Therapy

## 2015-09-01 ENCOUNTER — Encounter: Payer: Self-pay | Admitting: Occupational Therapy

## 2015-09-01 DIAGNOSIS — Z7409 Other reduced mobility: Secondary | ICD-10-CM

## 2015-09-01 DIAGNOSIS — G8194 Hemiplegia, unspecified affecting left nondominant side: Secondary | ICD-10-CM

## 2015-09-01 DIAGNOSIS — R201 Hypoesthesia of skin: Secondary | ICD-10-CM

## 2015-09-01 DIAGNOSIS — R4189 Other symptoms and signs involving cognitive functions and awareness: Secondary | ICD-10-CM

## 2015-09-01 DIAGNOSIS — R414 Neurologic neglect syndrome: Secondary | ICD-10-CM

## 2015-09-01 NOTE — Therapy (Signed)
[facility] [address] [place], [place], [address] Phone: [phone]   Fax:  [phone]  Occupational Therapy Treatment  Patient Details  Name: Brian Hall MRN: [id number] Date of Birth: 1956-08-06 No Data Recorded  Encounter Date: 09/01/2015      OT End of Session - 09/01/15 1725    Visit Number 24   Number of Visits 82   Date for OT Re-Evaluation 09/01/15   Authorization Type [employer]   OT Start Time 6761   OT Stop Time 1400   OT Time Calculation (min) 43 min   Activity Tolerance Patient tolerated treatment well      Past Medical History  Diagnosis Date  . Hypertension   . Diabetes mellitus   . Obesity   . CHF (congestive heart failure) ([place])   . SOB (shortness of breath)   . Hyperkalemia   . Renal insufficiency   . Cardiogenic shock ([place])   . Cardiac LV ejection fraction 10-20%   . Hyperlipemia   . Hypothyroidism   . Throat cancer ([place]) 2011    s/p neck dissection, radiation, chemo  . Stroke ([place])   . Blurred vision   . Joint pain   . Joint swelling     Past Surgical History  Procedure Laterality Date  . Cardiac catheterization    . Neck dissection Right 2011    s/p resection of throat cancer with multiple nodes removed    There were no vitals filed for this visit.  Visit Diagnosis:  Hemiplegia affecting left nondominant side ([place]) - Plan: Ot plan of care cert/re-cert  Impaired functional mobility and activity tolerance - Plan: Ot plan of care cert/re-cert  Impaired sensation - Plan: Ot plan of care cert/re-cert  Impaired cognition - Plan: Ot plan of care cert/re-cert  Left-sided neglect - Plan: Ot plan of care cert/re-cert      Subjective Assessment - 09/01/15 1321    Subjective  I scapred my fingers when I fell on the floor last night.  I didn't get hurt. I got up by myself.    Patient is accompained by: Family member  wife   Pertinent History see epic snapshot   Patient  Stated Goals Get back to doing some of the things I used to do.   Currently in Pain? (p) No/denies                      OT Treatments/Exercises (OP) - 09/01/15 0001    Neurological Re-education Exercises   Other Exercises 1 Following estim neuro re ed to continue to address graded grasp and release as well as beginning today to functionally use 2 point pinch to pick up and place object - pt requiring mod facilitation to complete this activity but able to do with repetition.  Also worked with wife to build in [place] tasks into HEP and pt and wife able to return demonstrate. Discussed potential units pt and wife can purchase for home use and wife to order. Pt will return for one more visit once unit arrives so that unit can be set up and pt and wife can be instructed.   Acupuncturist Location L wrist and fingers   Electrical Stimulation Action wrist and finger extension   Electrical Stimulation Parameters 250pw, 50 pps, 10 sec cycle, 2 sec ramp, intensity 18-33   Electrical Stimulation Goals Tone;Neuromuscular facilitation  OT Short Term Goals - 09/01/15 1721    OT SHORT TERM GOAL  #9   Status --  pt is now stepping over ledge and into shower wtih min with wife to sit on seat   OT SHORT TERM GOAL  #11   TITLE Pt and wife will be mod I with upgraded HEP - 05/23/2015 (date adjusted as pt missed 2 weeks of therapy)   Status Achieved   OT SHORT TERM GOAL  #12   TITLE Pt will be supevision for simple hot meal prep   Status Achieved  05/25/2015   OT SHORT TERM GOAL  #13   TITLE Pt will demonstrate ability for bilateral mid reach with light object without facilitation.   Status Achieved           OT Long Term Goals - 09/01/15 1721    OT LONG TERM GOAL #8   Title Pt will demonstrate the ability to use LUE as gross a for basic ADL's 100% of the time - 09/01/2015 with renewal   Status Not Met  able to use for  bathing   OT LONG TERM GOAL  #10   TITLE Pt will be mod I for simple hot meal prep   Status Achieved   OT LONG TERM GOAL  #11   TITLE Pt will demonstrate ability for functional tasks at ambulatory level for 10 minutes without needing rest break.   Status Achieved  with min facilitation and min compensations   OT LONG TERM GOAL  #12   TITLE Pt will demonstrate ability to release objects with mod facilitation. 09/01/2015 with renewal   Status Achieved   OT LONG TERM GOAL  #13   TITLE Pt will demonstrate ability for unilateral low reach with assist for gross grasp with mn compensations.   Status Achieved   OT LONG TERM GOAL  #14   TITLE Pt will be mod I with upgraded HEP prn   Status On-going               Plan - 09/01/15 1722    Clinical Impression Statement Pt has met all but two LTG's.  Pt and wife to purchase  estim unit for home use. Pt will return with wife once unit has arrived for set up unit and to instruct pt and wife on use of unit.    Pt will benefit from skilled therapeutic intervention in order to improve on the following deficits (Retired) Decreased balance;Decreased coordination;Decreased cognition;Decreased knowledge of use of DME;Decreased mobility;Decreased range of motion;Decreased safety awareness;Difficulty walking;Decreased strength;Increased edema;Increased muscle spasms;Impaired UE functional use;Impaired tone;Impaired sensation;Impaired vision/preception;Pain   Clinical Impairments Affecting Rehab Potential apraxia, sensory loss,perceptual deficits, cognitive impairment   OT Frequency Other (comment)  3 visits over next 6 weeks   OT Duration Other (comment)  3 visits over next 6 weeks   OT Treatment/Interventions Self-care/ADL training;Cryotherapy;Moist Heat;Electrical Stimulation;Fluidtherapy;DME and/or AE instruction;Energy conservation;Neuromuscular education;Therapeutic exercise;Functional Mobility Training;Manual Therapy;Passive range of  motion;Splinting;Therapeutic activities;Balance training;Patient/family education;Cognitive remediation/compensation   Plan place on hold until e stim unit arrives then instruct for home use.    Consulted and Agree with Plan of Care Patient;Family member/caregiver   Family Member Consulted wife        Problem List Patient Active Problem List   Diagnosis Date Noted  . 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 Spastic hemiparesis affecting nondominant side ([place]) 11/08/2014  . Hypothyroidism 10/27/2014  . Left-sided neglect 09/23/2014  . Acute ischemic right MCA stroke ([place]) 09/16/2014  .  Left hemiparesis ([place])   . CVA (cerebral vascular accident) ([place])   . Right carotid artery occlusion   . Acute right MCA stroke ([place])   . Essential hypertension 09/12/2014  . CVA (cerebral infarction) 09/12/2014  . Type 2 diabetes mellitus with peripheral neuropathy ([place]) 09/12/2014    Quay Burow, OTR/L 09/01/2015, [facility] [address] [place], [place], [address] Phone: [phone]   Fax:  [phone]  Name: Brian Hall MRN: [id number] Date of Birth: 11-May-1956

## 2015-09-07 ENCOUNTER — Other Ambulatory Visit: Payer: Self-pay | Admitting: Family Medicine

## 2015-09-14 ENCOUNTER — Ambulatory Visit: Payer: BLUE CROSS/BLUE SHIELD | Attending: Physical Medicine & Rehabilitation | Admitting: Occupational Therapy

## 2015-09-14 ENCOUNTER — Encounter: Payer: Self-pay | Admitting: Occupational Therapy

## 2015-09-14 DIAGNOSIS — G8194 Hemiplegia, unspecified affecting left nondominant side: Secondary | ICD-10-CM | POA: Diagnosis not present

## 2015-09-14 DIAGNOSIS — R201 Hypoesthesia of skin: Secondary | ICD-10-CM | POA: Insufficient documentation

## 2015-09-14 DIAGNOSIS — Z7409 Other reduced mobility: Secondary | ICD-10-CM | POA: Diagnosis present

## 2015-09-14 DIAGNOSIS — R4189 Other symptoms and signs involving cognitive functions and awareness: Secondary | ICD-10-CM | POA: Diagnosis present

## 2015-09-14 DIAGNOSIS — R414 Neurologic neglect syndrome: Secondary | ICD-10-CM | POA: Diagnosis present

## 2015-09-14 NOTE — Therapy (Signed)
[facility] [address] [place], [place], [address] Phone: [phone]   Fax:  [phone]  Occupational Therapy Treatment  Patient Details  Name: Brian Hall MRN: [id number] Date of Birth: May 30, 1956 No Data Recorded  Encounter Date: 09/14/2015      OT End of Session - 09/14/15 1721    Visit Number 68   Number of Visits 37   Date for OT Re-Evaluation 10/02/15   Authorization Type [employer]   OT Start Time 1446   OT Stop Time 1530   OT Time Calculation (min) 44 min   Activity Tolerance Patient tolerated treatment well      Past Medical History  Diagnosis Date  . Hypertension   . Diabetes mellitus   . Obesity   . CHF (congestive heart failure) ([place])   . SOB (shortness of breath)   . Hyperkalemia   . Renal insufficiency   . Cardiogenic shock ([place])   . Cardiac LV ejection fraction 10-20%   . Hyperlipemia   . Hypothyroidism   . Throat cancer ([place]) 2011    s/p neck dissection, radiation, chemo  . Stroke ([place])   . Blurred vision   . Joint pain   . Joint swelling     Past Surgical History  Procedure Laterality Date  . Cardiac catheterization    . Neck dissection Right 2011    s/p resection of throat cancer with multiple nodes removed    There were no vitals filed for this visit.  Visit Diagnosis:  Hemiplegia affecting left nondominant side (HCC)  Impaired functional mobility and activity tolerance  Impaired sensation  Impaired cognition  Left-sided neglect      Subjective Assessment - 09/14/15 1453    Subjective  My estim machine came in   Patient is accompained by: Family member  wife   Pertinent History see epic snapshot   Patient Stated Goals Get back to doing some of the things I used to do.   Currently in Pain? No/denies                      OT Treatments/Exercises (OP) - 09/14/15 0001    Electrical Stimulation   Electrical Stimulation Location wrist/fingers   Electrical Stimulation Action wrist and finger extension   Electrical Stimulation Parameters 250pw, 50 pps, 10 sec cycle, 3 sec ramp, intensity on PTS MACHINE= 8   Electrical Stimulation Goals Tone;Pain;Neuromuscular facilitation                OT Education - 09/14/15 1719    Education provided Yes   Education Details set up and use of home estim machine   Person(s) Educated Patient;Spouse   Methods Explanation;Demonstration;Verbal cues;Handout   Comprehension Verbalized understanding;Returned demonstration          OT Short Term Goals - 09/14/15 1719    OT SHORT TERM GOAL  #9   Status --  pt is now stepping over ledge and into shower wtih min with wife to sit on seat   OT SHORT TERM GOAL  #11   TITLE Pt and wife will be mod I with upgraded HEP - 05/23/2015 (date adjusted as pt missed 2 weeks of therapy)   Status Achieved   OT SHORT TERM GOAL  #12   TITLE Pt will be supevision for simple hot meal prep   Status Achieved  05/25/2015   OT SHORT TERM GOAL  #13   TITLE Pt will demonstrate ability for bilateral mid reach  with light object without facilitation.   Status Achieved           OT Long Term Goals - 09/14/15 1719    OT LONG TERM GOAL #8   Title Pt will demonstrate the ability to use LUE as gross a for basic ADL's 100% of the time - 09/01/2015 with renewal   Status Not Met  able to use for bathing   OT LONG TERM GOAL  #10   TITLE Pt will be mod I for simple hot meal prep   Status Achieved   OT LONG TERM GOAL  #11   TITLE Pt will demonstrate ability for functional tasks at ambulatory level for 10 minutes without needing rest break.   Status Achieved  with min facilitation and min compensations   OT LONG TERM GOAL  #12   TITLE Pt will demonstrate ability to release objects with mod facilitation. 09/01/2015 with renewal   Status Achieved   OT LONG TERM GOAL  #13   TITLE Pt will demonstrate ability for unilateral low reach with assist for gross grasp with mn  compensations.   Status Achieved   OT LONG TERM GOAL  #14   TITLE Pt will be mod I with upgraded HEP prn   Status Achieved               Plan - 09/14/15 1720    Clinical Impression Statement Pt and wife have purchased home estim machine and returned today for set up and home use instruciton.   Pt will benefit from skilled therapeutic intervention in order to improve on the following deficits (Retired) Decreased balance;Decreased coordination;Decreased cognition;Decreased knowledge of use of DME;Decreased mobility;Decreased range of motion;Decreased safety awareness;Difficulty walking;Decreased strength;Increased edema;Increased muscle spasms;Impaired UE functional use;Impaired tone;Impaired sensation;Impaired vision/preception;Pain   Rehab Potential Good   Clinical Impairments Affecting Rehab Potential apraxia, sensory loss,perceptual deficits, cognitive impairment   OT Frequency Other (comment)  one visit for home use fo estim machine   OT Treatment/Interventions Self-care/ADL training;Cryotherapy;Moist Heat;Electrical Stimulation;Fluidtherapy;DME and/or AE instruction;Energy conservation;Neuromuscular education;Therapeutic exercise;Functional Mobility Training;Manual Therapy;Passive range of motion;Splinting;Therapeutic activities;Balance training;Patient/family education;Cognitive remediation/compensation   Plan d/c from OT   Consulted and Agree with Plan of Care Patient;Family member/caregiver   Family Member Consulted wife        Problem List Patient Active Problem List   Diagnosis Date Noted  . 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 Spastic hemiparesis affecting nondominant side ([facility]) 11/08/2014  . Hypothyroidism 10/27/2014  . Left-sided neglect 09/23/2014  . Acute ischemic right MCA stroke ([place]) 09/16/2014  . Left hemiparesis ([place])   . CVA (cerebral vascular accident) ([place])   . Right carotid artery occlusion   . Acute right MCA stroke ([place])   . Essential  hypertension 09/12/2014  . CVA (cerebral infarction) 09/12/2014  . Type 2 diabetes mellitus with peripheral neuropathy ([place]) 09/12/2014   OCCUPATIONAL THERAPY DISCHARGE SUMMARY  Visits from Start of Care: 56  Current functional level related to goals / functional outcomes: See goal status   Remaining deficits: Spastic hemiplegia, impaired sensation, impaired cognition,    Education / Equipment: HEP, estim machine  Plan: Patient agrees to discharge.  Patient goals were met. Patient is being discharged due to meeting the stated rehab goals.  ?????      Quay Burow, OTR/L 09/14/2015, 5:23 PM  [facility] [address] [place], [place], [address] Phone: [phone]   Fax:  [phone]  Name: DEWEY VIENS MRN: [id number]  Date of Birth: [birth year]

## 2015-09-14 NOTE — Patient Instructions (Signed)
Pt has purchased his own NMES e stim machine for home use.  Instructed pt and wife on set up of machine and use. Able to return demonstrate

## 2015-09-15 ENCOUNTER — Ambulatory Visit (HOSPITAL_BASED_OUTPATIENT_CLINIC_OR_DEPARTMENT_OTHER): Payer: BLUE CROSS/BLUE SHIELD | Admitting: Physical Medicine & Rehabilitation

## 2015-09-15 ENCOUNTER — Encounter: Payer: Self-pay | Admitting: Physical Medicine & Rehabilitation

## 2015-09-15 ENCOUNTER — Encounter: Payer: BLUE CROSS/BLUE SHIELD | Attending: Physical Medicine & Rehabilitation

## 2015-09-15 VITALS — BP 141/57 | HR 63 | Resp 14

## 2015-09-15 DIAGNOSIS — G8110 Spastic hemiplegia affecting unspecified side: Secondary | ICD-10-CM | POA: Insufficient documentation

## 2015-09-15 DIAGNOSIS — I63511 Cerebral infarction due to unspecified occlusion or stenosis of right middle cerebral artery: Secondary | ICD-10-CM | POA: Insufficient documentation

## 2015-09-15 NOTE — Progress Notes (Signed)
Subjective:    Patient ID: Brian Hall, male    DOB: July 02, 1956, 60 y.o.   MRN: [id number] 07/17/15 Botox Injection for spasticity using needle EMG guidance  Dilution: 50 Units/ml Indication: Severe spasticity which interferes with ADL,mobility and/or  hygiene and is unresponsive to medication management and other conservative care Informed consent was obtained after describing risks and benefits of the procedure with the patient. This includes bleeding, bruising, infection, excessive weakness, or medication side effects. A REMS form is on file and signed. Needle: 27g 1" and 25g 2" needle electrode Number of units per muscle  Biceps75 FCR25 FCU0 FDS25 FDP50  06/19/15 Phenol neurolysis of the  Left tibial nerve HPI Patient has finished up with OT yesterday. Has a home access program. Has his own electrostimulation unit. Is starting to grasp and release objects. Also able to bring his hand toward his mouth. He's had no new problems at the left foot and ankle he is still able to clear it some. No increasing spasticity in the left ankle yet Patient has some mild toe curling during ambulation but no foot inversion   Using sweeper  Mod I bathing dressing and toileting Pain Inventory Average Pain 1 Pain Right Now 0 My pain is NA  In the last 24 hours, has pain interfered with the following? General activity 0 Relation with others 0 Enjoyment of life 0 What TIME of day is your pain at its worst? daytime Sleep (in general) Fair  Pain is worse with: walking, sitting and some activites Pain improves with: medication Relief from Meds: 10  Mobility walk with assistance use a walker how many minutes can you walk? 3-5 ability to climb steps?  yes do you drive?  no use a wheelchair transfers alone Do you have any goals in this area?  yes  Function disabled: date disabled 09/12/14 I need assistance with the following:  dressing, bathing, meal prep, household duties and  shopping Do you have any goals in this area?  yes  Neuro/Psych bladder control problems anxiety  Prior Studies Any changes since last visit?  no  Physicians involved in your care Any changes since last visit?  no   Family History  Problem Relation Age of Onset  . Hypertension Mother   . Hypertension Father   . Stroke Father   . Stroke Sister    Social History   Social History  . Marital Status: Legally Separated    Spouse Name: N/A  . Number of Children: 1  . Years of Education: 11   Occupational History  . truck driver, retired    Social History Main Topics  . Smoking status: Never Smoker   . Smokeless tobacco: None  . Alcohol Use: No  . Drug Use: No  . Sexual Activity: Not Asked   Other Topics Concern  . None   Social History Narrative   Married, 1 daughter   Right handed   Caffeine use - rare tea      Past Surgical History  Procedure Laterality Date  . Cardiac catheterization    . Neck dissection Right 2011    s/p resection of throat cancer with multiple nodes removed   Past Medical History  Diagnosis Date  . Hypertension   . Diabetes mellitus   . Obesity   . CHF (congestive heart failure) ([place])   . SOB (shortness of breath)   . Hyperkalemia   . Renal insufficiency   . Cardiogenic shock ([place])   . Cardiac LV ejection  fraction 10-20%   . Hyperlipemia   . Hypothyroidism   . Throat cancer ([place]) 2011    s/p neck dissection, radiation, chemo  . Stroke ([place])   . Blurred vision   . Joint pain   . Joint swelling    BP 141/57 mmHg  Pulse 63  Resp 14  SpO2 98%  Opioid Risk Score:   Fall Risk Score:  `1  Depression screen PHQ 2/9  Depression screen [facility] 2/9 07/14/2015 04/14/2015 04/11/2015 12/06/2014 10/27/2014  Decreased Interest 1 2 1 2  0  Down, Depressed, Hopeless 1 0 1 0 0  PHQ - 2 Score 2 2 2 2  0  Altered sleeping 0 - 0 0 -  Tired, decreased energy 0 - 0 0 -  Change in appetite 0 - 0 0 -  Feeling bad or failure about yourself  0 - 0 1 -    Trouble concentrating 0 - 0 0 -  Moving slowly or fidgety/restless 0 - 0 2 -  Suicidal thoughts 0 - 0 0 -  PHQ-9 Score 2 - 2 5 -     Review of Systems  Constitutional: Positive for unexpected weight change.       Bladder control problems  Cardiovascular:       Limb swelling  Endocrine:       High blood sugar  Genitourinary: Positive for difficulty urinating.  Psychiatric/Behavioral: The patient is nervous/anxious.   All other systems reviewed and are negative.      Objective:   Physical Exam  Ambulate with a rolling walker no physical assistance he also uses a left AFO. Uses a Velcro hand grip for the left hand.  Left upper extremity 3 minus at the deltoid biceps and triceps, 0 at the wrist and finger flexors and extensors.  Ashworth 3 tone at the finger flexors Ashworth 2 at the wrist flexors Ashworth 1 at the biceps  4/5 in the left hip flexor and knee extensor and 3 minus at the ankle dorsiflexor      Assessment & Plan:  1.  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 He is now at a modified independent level with mobility as well as self-care. Still has residual issues with spastic hemiparesis particularly in the distal left upper and left lower extremity. Anticipate need for recurrent Botox injections and left upper extremity may need recurrent phenol neural lysis of the left tibial nerve as well.  We'll schedule for Botox using the following injection sites and dosages, 4 weeks Biceps75 FCR25 FCU0 FDS25 FDP50  We'll monitor his left lower extremity spasticity in the toe flexors and foot inverters prior to scheduling repeat phenol tibial neural lysis

## 2015-09-15 NOTE — Patient Instructions (Addendum)
Please let me know if your foot starts turning in more on the lef tor if toes start curling too much

## 2015-09-19 ENCOUNTER — Other Ambulatory Visit: Payer: Self-pay

## 2015-09-26 ENCOUNTER — Telehealth: Payer: Self-pay

## 2015-09-26 NOTE — Telephone Encounter (Signed)
Can you check formulary to see if they cover Januvia or onglyza?

## 2015-09-26 NOTE — Telephone Encounter (Signed)
Insurance denied Cytogeneticist

## 2015-10-13 ENCOUNTER — Encounter: Payer: Self-pay | Admitting: Physical Medicine & Rehabilitation

## 2015-10-13 ENCOUNTER — Ambulatory Visit (HOSPITAL_BASED_OUTPATIENT_CLINIC_OR_DEPARTMENT_OTHER): Payer: BLUE CROSS/BLUE SHIELD | Admitting: Physical Medicine & Rehabilitation

## 2015-10-13 ENCOUNTER — Encounter: Payer: BLUE CROSS/BLUE SHIELD | Attending: Physical Medicine & Rehabilitation

## 2015-10-13 VITALS — BP 147/85 | HR 66 | Resp 14

## 2015-10-13 DIAGNOSIS — I63511 Cerebral infarction due to unspecified occlusion or stenosis of right middle cerebral artery: Secondary | ICD-10-CM | POA: Diagnosis not present

## 2015-10-13 DIAGNOSIS — G8110 Spastic hemiplegia affecting unspecified side: Secondary | ICD-10-CM

## 2015-10-13 NOTE — Progress Notes (Signed)
Botox Injection for spasticity using needle EMG guidance  Dilution: 50 Units/ml Indication: Severe spasticity which interferes with ADL,mobility and/or  hygiene and is unresponsive to medication management and other conservative care Informed consent was obtained after describing risks and benefits of the procedure with the patient. This includes bleeding, bruising, infection, excessive weakness, or medication side effects. A REMS form is on file and signed. Needle: 25g 60mm needle electrode Number of units per muscle Biceps75 FCR25 FCU0 FDS25 FDP50 All injections were done after obtaining appropriate EMG activity and after negative drawback for blood. The patient tolerated the procedure well. Post procedure instructions were given. A followup appointment was made.

## 2015-10-13 NOTE — Patient Instructions (Signed)

## 2015-10-16 ENCOUNTER — Ambulatory Visit (INDEPENDENT_AMBULATORY_CARE_PROVIDER_SITE_OTHER): Payer: BLUE CROSS/BLUE SHIELD | Admitting: Family Medicine

## 2015-10-16 ENCOUNTER — Encounter: Payer: Self-pay | Admitting: Family Medicine

## 2015-10-16 VITALS — BP 132/73 | HR 64 | Temp 97.5°F | Ht 71.0 in | Wt 255.8 lb

## 2015-10-16 DIAGNOSIS — E1142 Type 2 diabetes mellitus with diabetic polyneuropathy: Secondary | ICD-10-CM

## 2015-10-16 DIAGNOSIS — I10 Essential (primary) hypertension: Secondary | ICD-10-CM

## 2015-10-16 DIAGNOSIS — N400 Enlarged prostate without lower urinary tract symptoms: Secondary | ICD-10-CM

## 2015-10-16 DIAGNOSIS — E038 Other specified hypothyroidism: Secondary | ICD-10-CM

## 2015-10-16 DIAGNOSIS — I69354 Hemiplegia and hemiparesis following cerebral infarction affecting left non-dominant side: Secondary | ICD-10-CM | POA: Diagnosis not present

## 2015-10-16 DIAGNOSIS — E785 Hyperlipidemia, unspecified: Secondary | ICD-10-CM | POA: Diagnosis not present

## 2015-10-16 LAB — POCT GLYCOSYLATED HEMOGLOBIN (HGB A1C): Hemoglobin A1C: 6.4

## 2015-10-16 MED ORDER — SAXAGLIPTIN HCL 5 MG PO TABS: 5.0000 mg | ORAL_TABLET | Freq: Every day | ORAL | Status: DC

## 2015-10-16 MED ORDER — TAMSULOSIN HCL 0.4 MG PO CAPS: 0.8000 mg | ORAL_CAPSULE | Freq: Every day | ORAL | Status: DC

## 2015-10-16 NOTE — Addendum Note (Signed)
Addended by: Selmer Dominion on: 10/16/2015 11:42 AM   Modules accepted: Orders

## 2015-10-16 NOTE — Progress Notes (Signed)
Subjective:  Patient ID: Brian Hall, male    DOB: Jun 23, 1956  Age: 60 y.o. MRN: [id number]  CC: Hypertension; Diabetes; and Hyperlipidemia   HPI Brian Hall presents for  follow-up of hypertension. Patient has no history of headache chest pain or shortness of breath or recent cough. Patient also denies symptoms of TIA such as numbness weakness lateralizing. Patient checks  blood pressure at home and has not had any elevated readings recently. Patient denies side effects from his medication. States taking it regularly.  Patient also  in for follow-up of elevated cholesterol. Doing well without complaints on current medication. Denies side effects of statin including myalgia and arthralgia and nausea. Also in today for liver function testing. Currently no chest pain, shortness of breath or other cardiovascular related symptoms noted.  Follow-up of diabetes. Patient does check blood sugar at home. Readings run between 100 and 140 Patient denies symptoms such as polyuria, polydipsia, excessive hunger, nausea Having nocturia X 2 No significant hypoglycemic spells noted. Medications as noted below. Taking them regularly without complication/adverse reaction being reported today.   Out of bed to urinate 2 or more times a night History Brian Hall has a past medical history of Hypertension; Diabetes mellitus; Obesity; CHF (congestive heart failure) ([place]); SOB (shortness of breath); Hyperkalemia; Renal insufficiency; Cardiogenic shock ([place]); Cardiac LV ejection fraction 10-20%; Hyperlipemia; Hypothyroidism; Throat cancer ([place]) (2011); Stroke [facility]); Blurred vision; Joint pain; and Joint swelling.   He has past surgical history that includes Cardiac catheterization and Neck dissection (Right, 2011).   His family history includes Hypertension in his father and mother; Stroke in his father and sister.He reports that he has never smoked. He does not have any smokeless tobacco history on file. He  reports that he does not drink alcohol or use illicit drugs.  Current Outpatient Prescriptions on File Prior to Visit  Medication Sig Dispense Refill  . amLODipine (NORVASC) 5 MG tablet Take 1 tablet (5 mg total) by mouth daily. For blood pressure 30 tablet 2  . carvedilol (COREG) 25 MG tablet Take 1 tablet (25 mg total) by mouth 2 (two) times daily. 60 tablet 2  . cholecalciferol (VITAMIN D) 400 UNITS TABS tablet Take 400 Units by mouth daily.    . clopidogrel (PLAVIX) 75 MG tablet Take 1 tablet (75 mg total) by mouth daily. 30 tablet 5  . glimepiride (AMARYL) 2 MG tablet take 1 tablet by mouth once daily with BREAKFAST 30 tablet 1  . levothyroxine (SYNTHROID, LEVOTHROID) 88 MCG tablet take 1 tablet by mouth once daily daily BEFORE BREAKFAST 30 tablet 6  . linagliptin (TRADJENTA) 5 MG TABS tablet Take 1 tablet (5 mg total) by mouth daily. 30 tablet 5  . lisinopril (PRINIVIL,ZESTRIL) 40 MG tablet Take 1 tablet (40 mg total) by mouth daily. 30 tablet 5  . Multiple Vitamins-Minerals (MULTIVITAMIN WITH MINERALS) tablet Take 1 tablet by mouth daily.    Marland Kitchen omeprazole (PRILOSEC) 40 MG capsule Take 1 capsule (40 mg total) by mouth daily. 30 capsule 5  . ONE TOUCH ULTRA TEST test strip   1  . pravastatin (PRAVACHOL) 40 MG tablet Take 1 tablet (40 mg total) by mouth daily. 30 tablet 5  . tiZANidine (ZANAFLEX) 2 MG tablet take 1 tablet by mouth three times a day 90 tablet 2  . traMADol (ULTRAM) 50 MG tablet take 1 tablet by mouth every 6 hours if needed for MODERATE PAIN 60 tablet 3   No current facility-administered medications on file prior to  visit.    ROS Review of Systems  Constitutional: Negative for fever, chills, diaphoresis and unexpected weight change.  HENT: Negative for congestion, hearing loss, rhinorrhea and sore throat.   Eyes: Negative for visual disturbance.  Respiratory: Negative for cough and shortness of breath.   Cardiovascular: Negative for chest pain.  Gastrointestinal:  Negative for abdominal pain, diarrhea and constipation.  Genitourinary: Negative for dysuria and flank pain.  Musculoskeletal: Negative for joint swelling and arthralgias.  Skin: Negative for rash.  Neurological: Positive for weakness (left side - had botox 3 days ago LUE). Negative for dizziness and headaches.  Psychiatric/Behavioral: Negative for sleep disturbance and dysphoric mood.    Objective:  BP 132/73 mmHg  Pulse 64  Temp(Src) 97.5 F (36.4 C) (Oral)  Ht 5' 11"  (1.803 m)  Wt 255 lb 12.8 oz (116.03 kg)  BMI 35.69 kg/m2  SpO2 99%  BP Readings from Last 3 Encounters:  10/16/15 132/73  10/13/15 147/85  09/15/15 141/57    Wt Readings from Last 3 Encounters:  10/16/15 255 lb 12.8 oz (116.03 kg)  07/17/15 246 lb 8 oz (111.812 kg)  07/14/15 246 lb 6.4 oz (111.766 kg)     Physical Exam  Lab Results  Component Value Date   HGBA1C 5.7 07/14/2015   HGBA1C 5.2 04/11/2015   HGBA1C 5.1 01/09/2015    Lab Results  Component Value Date   WBC 9.4 07/14/2015   HGB 14.0* 10/27/2014   HCT 43.7 07/14/2015   PLT 182 07/14/2015   GLUCOSE 105* 07/14/2015   CHOL 149 07/14/2015   TRIG 256* 07/14/2015   HDL 26* 07/14/2015   LDLCALC 72 07/14/2015   ALT 19 07/14/2015   AST 19 07/14/2015   NA 138 07/14/2015   K 4.9 07/14/2015   CL 99 07/14/2015   CREATININE 1.27 07/14/2015   BUN 18 07/14/2015   CO2 23 07/14/2015   TSH 3.690 10/27/2014   INR 0.95 09/12/2014   HGBA1C 5.7 07/14/2015    No results found.  Assessment & Plan:   Brian Hall was seen today for hypertension, diabetes and hyperlipidemia.  Diagnoses and all orders for this visit:  Type 2 diabetes mellitus with peripheral neuropathy (HCC) -     POCT glycosylated hemoglobin (Hb A1C) -     CMP14+EGFR  Hyperlipemia  Hemiparesis affecting left side as late effect of cerebrovascular accident (CVA) ([place])  Essential hypertension -     POCT CBC -     CMP14+EGFR  Other specified hypothyroidism  BPH (benign  prostatic hyperplasia)  CVA (cerebral vascular accident) ([place]) -     POCT CBC  Other orders -     saxagliptin HCl (ONGLYZA) 5 MG TABS tablet; Take 1 tablet (5 mg total) by mouth daily. -     tamsulosin (FLOMAX) 0.4 MG CAPS capsule; Take 2 capsules (0.8 mg total) by mouth daily after supper. For urine flow and prostate   I am having Mr. Waymire start on saxagliptin HCl and tamsulosin. I am also having him maintain his multivitamin with minerals, cholecalciferol, ONE TOUCH ULTRA TEST, clopidogrel, levothyroxine, linagliptin, lisinopril, omeprazole, pravastatin, traMADol, carvedilol, amLODipine, tiZANidine, and glimepiride.  Meds ordered this encounter  Medications  . saxagliptin HCl (ONGLYZA) 5 MG TABS tablet    Sig: Take 1 tablet (5 mg total) by mouth daily.    Dispense:  90 tablet    Refill:  3  . tamsulosin (FLOMAX) 0.4 MG CAPS capsule    Sig: Take 2 capsules (0.8 mg total) by mouth daily after  supper. For urine flow and prostate    Dispense:  90 capsule    Refill:  3   CBC, CMP,A1c ordered. Cont home PT  Follow-up: Return in about 3 months (around 01/13/2016).  Claretta Fraise, M.D.

## 2015-10-17 ENCOUNTER — Other Ambulatory Visit: Payer: Self-pay | Admitting: Family Medicine

## 2015-10-17 LAB — CBC WITH DIFFERENTIAL/PLATELET
BASOS ABS: 0 10*3/uL (ref 0.0–0.2)
BASOS: 0 %
EOS (ABSOLUTE): 0.2 10*3/uL (ref 0.0–0.4)
Eos: 3 %
HEMATOCRIT: 42.3 % (ref 37.5–51.0)
Hemoglobin: 14.3 g/dL (ref 12.6–17.7)
IMMATURE GRANS (ABS): 0 10*3/uL (ref 0.0–0.1)
IMMATURE GRANULOCYTES: 0 %
LYMPHS: 18 %
Lymphocytes Absolute: 1.5 10*3/uL (ref 0.7–3.1)
MCH: 31.6 pg (ref 26.6–33.0)
MCHC: 33.8 g/dL (ref 31.5–35.7)
MCV: 94 fL (ref 79–97)
MONOS ABS: 0.6 10*3/uL (ref 0.1–0.9)
Monocytes: 7 %
NEUTROS ABS: 5.7 10*3/uL (ref 1.4–7.0)
NEUTROS PCT: 72 %
Platelets: 182 10*3/uL (ref 150–379)
RBC: 4.52 x10E6/uL (ref 4.14–5.80)
RDW: 14.4 % (ref 12.3–15.4)
WBC: 8 10*3/uL (ref 3.4–10.8)

## 2015-10-17 LAB — CMP14+EGFR
A/G RATIO: 1.6 (ref 1.1–2.5)
ALT: 26 IU/L (ref 0–44)
AST: 17 IU/L (ref 0–40)
Albumin: 4.5 g/dL (ref 3.5–5.5)
Alkaline Phosphatase: 70 IU/L (ref 39–117)
BILIRUBIN TOTAL: 0.6 mg/dL (ref 0.0–1.2)
BUN/Creatinine Ratio: 21 — ABNORMAL HIGH (ref 9–20)
BUN: 30 mg/dL — ABNORMAL HIGH (ref 6–24)
CALCIUM: 9.7 mg/dL (ref 8.7–10.2)
CHLORIDE: 100 mmol/L (ref 96–106)
CO2: 23 mmol/L (ref 18–29)
Creatinine, Ser: 1.42 mg/dL — ABNORMAL HIGH (ref 0.76–1.27)
GFR calc Af Amer: 63 mL/min/{1.73_m2} (ref >59)
GFR, EST NON AFRICAN AMERICAN: 54 mL/min/{1.73_m2} — AB (ref >59)
GLOBULIN, TOTAL: 2.9 g/dL (ref 1.5–4.5)
Glucose: 136 mg/dL — ABNORMAL HIGH (ref 65–99)
POTASSIUM: 4.5 mmol/L (ref 3.5–5.2)
SODIUM: 139 mmol/L (ref 134–144)
Total Protein: 7.4 g/dL (ref 6.0–8.5)

## 2015-10-23 ENCOUNTER — Telehealth: Payer: Self-pay | Admitting: Family Medicine

## 2015-10-23 MED ORDER — TAMSULOSIN HCL 0.4 MG PO CAPS: 0.8000 mg | ORAL_CAPSULE | Freq: Every day | ORAL | Status: DC

## 2015-10-23 NOTE — Telephone Encounter (Signed)
New rx sent over to [facility] for #180 of the flomax since pt is to take 2 capsules daily.

## 2015-11-03 ENCOUNTER — Other Ambulatory Visit: Payer: Self-pay | Admitting: Family Medicine

## 2015-11-17 LAB — HM DIABETES EYE EXAM

## 2015-11-24 ENCOUNTER — Encounter: Payer: BLUE CROSS/BLUE SHIELD | Attending: Physical Medicine & Rehabilitation

## 2015-11-24 ENCOUNTER — Ambulatory Visit (HOSPITAL_BASED_OUTPATIENT_CLINIC_OR_DEPARTMENT_OTHER): Payer: BLUE CROSS/BLUE SHIELD | Admitting: Physical Medicine & Rehabilitation

## 2015-11-24 ENCOUNTER — Encounter: Payer: Self-pay | Admitting: Physical Medicine & Rehabilitation

## 2015-11-24 VITALS — BP 123/67 | HR 70 | Resp 14

## 2015-11-24 DIAGNOSIS — Z8673 Personal history of transient ischemic attack (TIA), and cerebral infarction without residual deficits: Secondary | ICD-10-CM

## 2015-11-24 DIAGNOSIS — G8110 Spastic hemiplegia affecting unspecified side: Secondary | ICD-10-CM | POA: Diagnosis not present

## 2015-11-24 DIAGNOSIS — I63511 Cerebral infarction due to unspecified occlusion or stenosis of right middle cerebral artery: Secondary | ICD-10-CM | POA: Insufficient documentation

## 2015-11-24 MED ORDER — TIZANIDINE HCL 2 MG PO TABS: 2.0000 mg | ORAL_TABLET | Freq: Three times a day (TID) | ORAL | Status: DC

## 2015-11-24 NOTE — Progress Notes (Signed)
Subjective:    Patient ID: Brian Hall, male    DOB: [birth year], 58 y.o.   MRN: [id number] 60 year old right-handed male who lives with his wife independent prior to admission.  He presented on September 12, 2014, with left-sided weakness and slurred speech.  MRI of the brain showed acute large territory right MCA territory infarct involving right frontal lobe as well as remote infarcts medial left occipital lobe and left cerebellar hemisphere.  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  Echocardiogram with ejection fraction of 35%.  No source of embolism. DATE OF ADMISSION:  09/16/2014 DATE OF DISCHARGE:  10/14/2014  HPI 10/13/2015 Biceps75 FCR25 FCU0 FDS25 FDP50  Pain Inventory Average Pain 1 Pain Right Now 1 My pain is intermittent and dull  In the last 24 hours, has pain interfered with the following? General activity 1 Relation with others 1 Enjoyment of life 1 What TIME of day is your pain at its worst? daytime Sleep (in general) Fair  Pain is worse with: some activites Pain improves with: rest and medication Relief from Meds: 10  Mobility walk with assistance use a walker how many minutes can you walk? 3-5 ability to climb steps?  yes do you drive?  no transfers alone Do you have any goals in this area?  yes  Function disabled: date disabled . I need assistance with the following:  dressing, bathing, meal prep, household duties and shopping Do you have any goals in this area?  yes  Neuro/Psych depression  Prior Studies Any changes since last visit?  no  Physicians involved in your care Any changes since last visit?  no   Family History  Problem Relation Age of Onset  . Hypertension Mother   . Hypertension Father   . Stroke Father   . Stroke Sister    Social History   Social History  . Marital Status: Legally Separated    Spouse Name: N/A  . Number of Children: 1  . Years of Education: 11   Occupational  History  . truck driver, retired    Social History Main Topics  . Smoking status: Never Smoker   . Smokeless tobacco: None  . Alcohol Use: No  . Drug Use: No  . Sexual Activity: Not Asked   Other Topics Concern  . None   Social History Narrative   Married, 1 daughter   Right handed   Caffeine use - rare tea      Past Surgical History  Procedure Laterality Date  . Cardiac catheterization    . Neck dissection Right 2011    s/p resection of throat cancer with multiple nodes removed   Past Medical History  Diagnosis Date  . Hypertension   . Diabetes mellitus   . Obesity   . CHF (congestive heart failure) ([place])   . SOB (shortness of breath)   . Hyperkalemia   . Renal insufficiency   . Cardiogenic shock ([place])   . Cardiac LV ejection fraction 10-20%   . Hyperlipemia   . Hypothyroidism   . Throat cancer ([place]) 2011    s/p neck dissection, radiation, chemo  . Stroke ([place])   . Blurred vision   . Joint pain   . Joint swelling    BP 123/67 mmHg  Pulse 70  Resp 14  SpO2 100%  Opioid Risk Score:   Fall Risk Score:  `1  Depression screen [facility] 2/9  Depression screen [facility] 2/9 10/16/2015 07/14/2015 04/14/2015 04/11/2015 12/06/2014 10/27/2014  Decreased  Interest 1 1 2 1 2  0  Down, Depressed, Hopeless 1 1 0 1 0 0  PHQ - 2 Score 2 2 2 2 2  0  Altered sleeping 0 0 - 0 0 -  Tired, decreased energy 0 0 - 0 0 -  Change in appetite 0 0 - 0 0 -  Feeling bad or failure about yourself  1 0 - 0 1 -  Trouble concentrating 0 0 - 0 0 -  Moving slowly or fidgety/restless 0 0 - 0 2 -  Suicidal thoughts 0 0 - 0 0 -  PHQ-9 Score 3 2 - 2 5 -  Difficult doing work/chores Not difficult at all - - - - -     Review of Systems  Constitutional: Positive for unexpected weight change.  Endocrine:       High blood sugar  All other systems reviewed and are negative.      Objective:   Physical Exam  Constitutional: He is oriented to person, place, and time. He appears well-developed and  well-nourished.  HENT:  Head: Normocephalic and atraumatic.  Eyes: Conjunctivae and EOM are normal. Pupils are equal, round, and reactive to light.  Neck: Normal range of motion.  Neurological: He is alert and oriented to person, place, and time.  Psychiatric: He has a normal mood and affect.  Nursing note and vitals reviewed.  Ashworth grade 3 in the finger flexors and the  left thumb flexor Ashworth is 0 in the wrist flexor Ashworth 2-3 in the left elbow flexor  Motor strength is 2 minus finger flexion        Assessment & Plan:  1. 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 Stroke onset greater than 1 year ago. Do not expect any further spontaneous recovery.  His left ankle equina varus spasticity is increasing again approximate 5 months post phenol injection will repeat in 3 weeks  Left upper extremity Botox can be repeated in ~6 weeks 50 units left FDS 50 units left FDP 25 units left FCR 25 units left FPL 150 units left biceps

## 2015-11-24 NOTE — Patient Instructions (Signed)
Will repeat left tibial nerve block with phenol since  foot is turning on its side again  Botox will be repeated in May, I would recommend a higher dose

## 2015-12-18 ENCOUNTER — Encounter: Payer: BLUE CROSS/BLUE SHIELD | Attending: Physical Medicine & Rehabilitation

## 2015-12-18 ENCOUNTER — Encounter: Payer: Self-pay | Admitting: Physical Medicine & Rehabilitation

## 2015-12-18 ENCOUNTER — Ambulatory Visit (HOSPITAL_BASED_OUTPATIENT_CLINIC_OR_DEPARTMENT_OTHER): Payer: BLUE CROSS/BLUE SHIELD | Admitting: Physical Medicine & Rehabilitation

## 2015-12-18 VITALS — BP 139/84 | HR 89

## 2015-12-18 DIAGNOSIS — G8110 Spastic hemiplegia affecting unspecified side: Secondary | ICD-10-CM | POA: Diagnosis not present

## 2015-12-18 DIAGNOSIS — I63511 Cerebral infarction due to unspecified occlusion or stenosis of right middle cerebral artery: Secondary | ICD-10-CM | POA: Insufficient documentation

## 2015-12-18 NOTE — Progress Notes (Signed)
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  Informed consent was obtained after describing the risks and benefits of the procedure with the patient this includes bleeding bruising and infection as well as medication side effects. The patient elected to proceed and has given written consent. Patient placed in a prone position on the exam table. External DC stimulation was applied to the popliteal space using a nerve stimulator. Plantar flexion twitch was obtained. The popliteal region was prepped with Betadine and then entered with a 22-gauge 40 mm needle electrode under electrical stimulation guidance. Plantar flexion which was obtained and confirmed. Then 4 cc of 5% phenol were injected. The patient tolerated procedure well. Post procedure instructions and followup visit were given. 

## 2015-12-18 NOTE — Patient Instructions (Signed)
Tibial nerve block with phenol today. This medication may start taking the fact today however full effect will be at about one week Duration of the effect is 3-6 months Side effects of medication may include right heel numbness or burning. Call if you have burning pain so we can recommend any medication for that. 

## 2016-01-15 ENCOUNTER — Encounter: Payer: BLUE CROSS/BLUE SHIELD | Attending: Physical Medicine & Rehabilitation

## 2016-01-15 ENCOUNTER — Ambulatory Visit (HOSPITAL_BASED_OUTPATIENT_CLINIC_OR_DEPARTMENT_OTHER): Payer: BLUE CROSS/BLUE SHIELD | Admitting: Physical Medicine & Rehabilitation

## 2016-01-15 ENCOUNTER — Encounter: Payer: Self-pay | Admitting: Adult Health

## 2016-01-15 ENCOUNTER — Encounter: Payer: Self-pay | Admitting: Physical Medicine & Rehabilitation

## 2016-01-15 ENCOUNTER — Ambulatory Visit (INDEPENDENT_AMBULATORY_CARE_PROVIDER_SITE_OTHER): Payer: BLUE CROSS/BLUE SHIELD | Admitting: Adult Health

## 2016-01-15 VITALS — BP 129/87 | HR 82 | Resp 14

## 2016-01-15 VITALS — BP 111/65 | HR 72 | Ht 71.0 in | Wt 260.0 lb

## 2016-01-15 DIAGNOSIS — G8110 Spastic hemiplegia affecting unspecified side: Secondary | ICD-10-CM

## 2016-01-15 DIAGNOSIS — I63511 Cerebral infarction due to unspecified occlusion or stenosis of right middle cerebral artery: Secondary | ICD-10-CM | POA: Insufficient documentation

## 2016-01-15 DIAGNOSIS — Z8673 Personal history of transient ischemic attack (TIA), and cerebral infarction without residual deficits: Secondary | ICD-10-CM

## 2016-01-15 NOTE — Patient Instructions (Signed)

## 2016-01-15 NOTE — Patient Instructions (Addendum)
Continue Plavix Maintain strict control of blood pressure goal less than 130/90 Cholesterol LDL less than 100 Hemoglobin A1c less than 6.5% If you have any strokelike symptoms call 911 immediately.

## 2016-01-15 NOTE — Progress Notes (Addendum)
Brian Hall: Brian Hall DOB: 12-29-1955  REASON FOR VISIT: follow up- stroke HISTORY FROM: Brian Hall  HISTORY OF PRESENT ILLNESS: Brian Hall is a 60 year old male with a history of stroke. He returns today for follow-up. He remains on Plavix and is tolerating it well. Denies any significant bruising or bleeding. His blood pressure is in normal range. His primary care manages his hypertension, hyperlipidemia and diabetes. He reports that his latest hemoglobin A1c was 6.4 %. The Brian Hall uses a walker when ambulating. He receives Botox injections in the left arm for spasticity. He has residual left-sided weakness from the stroke. He uses an AFO brace on the left foot he denies any new neurological symptoms. He returns today for an evaluation.   HISTORY 07/17/15: Brian Hall is a 60 year old male with a history of stroke. He returns today for follow-up. He is currently taking Plavix and tolerating it well. Denies any significant bruising or bleeding. Brian Hall's primary care is managing his hypertension, hyperlipidemia and diabetes. Brian Hall's blood pressure is slightly elevated today. The Brian Hall continues to have left-sided weakness. He is still in physical therapy. He is able to use a walker when ambulating. He has an AFO brace on the left foot. He is able to complete all ADLs independently. He does not operate a motor vehicle. He does not smoke cigarettes. The Brian Hall follows up with his primary care every 3 months. He recently had blood work in his hemoglobin A1c was 5.7. His cholesterol LDL was 72. He states that his primary care adjust his blood pressure medication on Friday. Brian Hall has been diagnosed with obstructive sleep apnea in the past. He was using a CPAP however he had lymph nodes removed from the neck and at that time they removed his tonsils as well. Since then the Brian Hall has not snore and no longer has daytime sleepiness and therefore stopped using his CPAP machine. He returns today  for an evaluation.    HISTORY 01/12/15 ([place]): 52 year Caucasian male seen today for the first office follow-up visit following hospital admission for stroke on 09/11/13. He developed sudden onset of slurred speech and left-sided weakness when he stood up and tone. He actually had fallen the night before as well and did not choose to come to the hospital. He presented beyond time window for thrombolysis. He had significant left hemiplegia on admission. CT scan of the head showed right MCA infarct with cytotoxic edema and subsequently MRI scan was obtained which showed even increase edema in the right insula as well as frontal lobe. There is an old infarct noted in the left occipital lobe. MRA of the brain was motion degraded but showed occluded right internal carotid artery in the neck with some distal reconstitution at the terminus via flow from circle of Willis. Transthoracic echo showed a decreased ejection fraction of 30-35% but no definite clot. There was diffuse hypokinesis. Carotid Dopplers confirmed right extracranial ICA occlusion and no significant stenosis on the left. LDL cholesterol was 88 he was on Pravachol which was resumed. Brian Hall was started on aspirin and Plavix for 3 months. He has been discharged from rehabilitation and is being home for last 3 months. He has finished home therapy and is currently participating in outpatient physical and occupational therapy. He is able to walk with a cane with a therapist But at home he has to walk with a belt and a walker and the wife standing. His blood pressure is quite well controlled and today it is 1-4/78. His fasting  sugars have also been well controlled. He is tolerating aspirin and Plavix with only minor bruising but no bleeding episodes. He has remote history of thyroid cancer and had received neck radiation.  REVIEW OF SYSTEMS: Out of a complete 14 system review of symptoms, the Brian Hall complains only of the following symptoms, and all other  reviewed systems are negative.  ALLERGIES: No Known Allergies  HOME MEDICATIONS: Outpatient Prescriptions Prior to Visit  Medication Sig Dispense Refill  . amLODipine (NORVASC) 5 MG tablet take 1 tablet by mouth once daily (FOR BLOOD PRESSURE) 30 tablet 5  . carvedilol (COREG) 25 MG tablet take 1 tablet by mouth twice a day 60 tablet 5  . cholecalciferol (VITAMIN D) 400 UNITS TABS tablet Take 400 Units by mouth daily.    . clopidogrel (PLAVIX) 75 MG tablet Take 1 tablet (75 mg total) by mouth daily. 30 tablet 5  . glimepiride (AMARYL) 2 MG tablet take 1 tablet by mouth daily WITH BREAKFAST 30 tablet 4  . levothyroxine (SYNTHROID, LEVOTHROID) 88 MCG tablet take 1 tablet by mouth once daily daily BEFORE BREAKFAST 30 tablet 6  . lisinopril (PRINIVIL,ZESTRIL) 40 MG tablet Take 1 tablet (40 mg total) by mouth daily. 30 tablet 5  . Multiple Vitamins-Minerals (MULTIVITAMIN WITH MINERALS) tablet Take 1 tablet by mouth daily.    Marland Kitchen omeprazole (PRILOSEC) 40 MG capsule Take 1 capsule (40 mg total) by mouth daily. 30 capsule 5  . ONE TOUCH ULTRA TEST test strip   1  . pravastatin (PRAVACHOL) 40 MG tablet Take 1 tablet (40 mg total) by mouth daily. 30 tablet 5  . saxagliptin HCl (ONGLYZA) 5 MG TABS tablet Take 1 tablet (5 mg total) by mouth daily. 90 tablet 3  . tamsulosin (FLOMAX) 0.4 MG CAPS capsule Take 2 capsules (0.8 mg total) by mouth daily after supper. For urine flow and prostate 180 capsule 3  . tiZANidine (ZANAFLEX) 2 MG tablet Take 1 tablet (2 mg total) by mouth 3 (three) times daily. 90 tablet 2  . traMADol (ULTRAM) 50 MG tablet take 1 tablet by mouth every 6 hours if needed for MODERATE PAIN 60 tablet 3   No facility-administered medications prior to visit.    PAST MEDICAL HISTORY: Past Medical History  Diagnosis Date  . Hypertension   . Diabetes mellitus   . Obesity   . CHF (congestive heart failure) ([place])   . SOB (shortness of breath)   . Hyperkalemia   . Renal insufficiency   .  Cardiogenic shock ([place])   . Cardiac LV ejection fraction 10-20%   . Hyperlipemia   . Hypothyroidism   . Throat cancer ([place]) 2011    s/p neck dissection, radiation, chemo  . Blurred vision   . Joint pain   . Joint swelling   . Stroke [facility]) 09/12/2014    PAST SURGICAL HISTORY: Past Surgical History  Procedure Laterality Date  . Cardiac catheterization    . Neck dissection Right 2011    s/p resection of throat cancer with multiple nodes removed    FAMILY HISTORY: Family History  Problem Relation Age of Onset  . Hypertension Mother   . Hypertension Father   . Stroke Father   . Stroke Sister   . Arthritis/Rheumatoid Brother     SOCIAL HISTORY: Social History   Social History  . Marital Status: Legally Separated    Spouse Name: N/A  . Number of Children: 1  . Years of Education: 11   Occupational History  . truck  driver, retired    Social History Main Topics  . Smoking status: Never Smoker   . Smokeless tobacco: Not on file  . Alcohol Use: No  . Drug Use: No  . Sexual Activity: Not on file   Other Topics Concern  . Not on file   Social History Narrative   Married, 1 daughter   Right handed   Caffeine use - rare tea         PHYSICAL EXAM  Filed Vitals:   01/15/16 1128  BP: 111/65  Pulse: 72  Height: 5\' 11"  (1.803 m)  Weight: 260 lb (117.935 kg)   Body mass index is 36.28 kg/(m^2).   Generalized: Well developed, in no acute distress   Neurological examination  Mentation: Alert oriented to time, place, history taking. Follows all commands speech and language fluent Cranial nerve II-XII: Pupils were equal round reactive to light. Extraocular movements were full, visual field were full on confrontational test. Facial sensation and strength were normal. slight left facial droop. Uvula tongue midline. Head turning and shoulder shrug were normal and symmetric. Motor: The motor testing reveals 5 over 5 strength the right upper and lower extremity. 3 out  of 5 strength in the left upper At the shoulder.1/5 in the left hand 4/5 left lower extremity.  Increased tone in the left arm  Sensory: Sensory testing is intact to soft touch on all 4 extremities. No evidence of extinction is noted.  Coordination: Cerebellar testing reveals good finger-nose-finger and heel-to-shin On the right some difficulty on the left due to weakness. Gait and station: Brian Hall uses a walker to ambulate. Tandem gait not attempted.  DIAGNOSTIC DATA (LABS, IMAGING, TESTING) - I reviewed Brian Hall records, labs, notes, testing and imaging myself where available.  Lab Results  Component Value Date   WBC 8.0 10/16/2015   HGB 14.0* 10/27/2014   HCT 42.3 10/16/2015   MCV 94 10/16/2015   PLT 182 10/16/2015      Component Value Date/Time   NA 139 10/16/2015 1143   NA 133* 09/19/2014 0626   K 4.5 10/16/2015 1143   CL 100 10/16/2015 1143   CO2 23 10/16/2015 1143   GLUCOSE 136* 10/16/2015 1143   GLUCOSE 243* 09/19/2014 0626   BUN 30* 10/16/2015 1143   BUN 42* 09/19/2014 0626   CREATININE 1.42* 10/16/2015 1143   CALCIUM 9.7 10/16/2015 1143   PROT 7.4 10/16/2015 1143   PROT 7.1 09/19/2014 0626   ALBUMIN 4.5 10/16/2015 1143   ALBUMIN 3.5 09/19/2014 0626   AST 17 10/16/2015 1143   ALT 26 10/16/2015 1143   ALKPHOS 70 10/16/2015 1143   BILITOT 0.6 10/16/2015 1143   BILITOT 1.1 09/19/2014 0626   GFRNONAA 54* 10/16/2015 1143   GFRAA 63 10/16/2015 1143   Lab Results  Component Value Date   CHOL 149 07/14/2015   HDL 26* 07/14/2015   LDLCALC 72 07/14/2015   TRIG 256* 07/14/2015   CHOLHDL 5.7* 07/14/2015   Lab Results  Component Value Date   HGBA1C 6.4 10/16/2015   No results found for: [id number] Lab Results  Component Value Date   TSH 3.690 10/27/2014      ASSESSMENT AND PLAN 60 y.o. year old male  has a past medical history of Hypertension; Diabetes mellitus; Obesity; CHF (congestive heart failure) ([place]); SOB (shortness of breath); Hyperkalemia; Renal  insufficiency; Cardiogenic shock ([place]); Cardiac LV ejection fraction 10-20%; Hyperlipemia; Hypothyroidism; Throat cancer ([place]) (2011); Blurred vision; Joint pain; Joint swelling; and Stroke ([place]) (09/12/2014). here with:  1. History of stroke 2. Spastic hemiplegia on the left  Overall the Brian Hall has remained stable. He will continue on Plavix. He should maintain strict control of his blood pressure goal less than 130/90. Cholesterol LDL less than 100 and hemoglobin A1c less than 6.5%. Brian Hall advised that if he has any strokelike symptoms he should let us know. He will follow-up in 6 months with Dr. Leonie Man.      Ward Givens, MSN, NP-C 01/15/2016, 11:32 AM [facility] [address] [place], [place] [address] [phone]    Personally participated in and made any corrections needed to history, physical, neuro exam,assessment and plan as stated above, evaluated lab date, reviewed imaging studies and agree with radiology interpretations.   Sarina Ill, MD [facility]

## 2016-01-15 NOTE — Progress Notes (Signed)
Botox Injection for spasticity using needle EMG guidance  Dilution: 50 Units/ml Indication: Severe spasticity which interferes with ADL,mobility and/or  hygiene and is unresponsive to medication management and other conservative care Informed consent was obtained after describing risks and benefits of the procedure with the patient. This includes bleeding, bruising, infection, excessive weakness, or medication side effects. A REMS form is on file and signed. Needle: 25 g 2" needle electrode Number of units per muscle 50 units left FDS 50 units left FDP 25 units left FCR 25 units left FPL 25 units left brachiorad 125 units left biceps   All injections were done after obtaining appropriate EMG activity and after negative drawback for blood. The patient tolerated the procedure well. Post procedure instructions were given. A followup appointment was made.

## 2016-01-17 ENCOUNTER — Ambulatory Visit: Payer: BLUE CROSS/BLUE SHIELD | Admitting: Family Medicine

## 2016-01-17 ENCOUNTER — Encounter: Payer: Self-pay | Admitting: Family Medicine

## 2016-01-17 ENCOUNTER — Ambulatory Visit (INDEPENDENT_AMBULATORY_CARE_PROVIDER_SITE_OTHER): Payer: BLUE CROSS/BLUE SHIELD | Admitting: Family Medicine

## 2016-01-17 VITALS — BP 108/62 | HR 71 | Temp 98.6°F | Ht 71.0 in | Wt 261.4 lb

## 2016-01-17 DIAGNOSIS — E785 Hyperlipidemia, unspecified: Secondary | ICD-10-CM | POA: Diagnosis not present

## 2016-01-17 DIAGNOSIS — I10 Essential (primary) hypertension: Secondary | ICD-10-CM | POA: Diagnosis not present

## 2016-01-17 DIAGNOSIS — G8110 Spastic hemiplegia affecting unspecified side: Secondary | ICD-10-CM | POA: Diagnosis not present

## 2016-01-17 DIAGNOSIS — H35049 Retinal micro-aneurysms, unspecified, unspecified eye: Secondary | ICD-10-CM

## 2016-01-17 DIAGNOSIS — E1142 Type 2 diabetes mellitus with diabetic polyneuropathy: Secondary | ICD-10-CM | POA: Diagnosis not present

## 2016-01-17 DIAGNOSIS — E13319 Other specified diabetes mellitus with unspecified diabetic retinopathy without macular edema: Secondary | ICD-10-CM

## 2016-01-17 DIAGNOSIS — G8194 Hemiplegia, unspecified affecting left nondominant side: Secondary | ICD-10-CM

## 2016-01-17 DIAGNOSIS — E038 Other specified hypothyroidism: Secondary | ICD-10-CM | POA: Diagnosis not present

## 2016-01-17 LAB — BAYER DCA HB A1C WAIVED: HB A1C: 6.5 % (ref <7.0)

## 2016-01-17 MED ORDER — TAMSULOSIN HCL 0.4 MG PO CAPS: 0.8000 mg | ORAL_CAPSULE | Freq: Every day | ORAL | Status: DC

## 2016-01-17 MED ORDER — SAXAGLIPTIN HCL 5 MG PO TABS: 5.0000 mg | ORAL_TABLET | Freq: Every day | ORAL | Status: DC

## 2016-01-17 NOTE — Addendum Note (Signed)
Addended by: Marin Olp on: 01/17/2016 03:53 PM   Modules accepted: Orders

## 2016-01-17 NOTE — Addendum Note (Signed)
Addended by: Claretta Fraise on: 01/17/2016 04:31 PM   Modules accepted: Orders

## 2016-01-17 NOTE — Progress Notes (Addendum)
Subjective:  Patient ID: Brian Hall, male    DOB: Feb 14, 1956  Age: 60 y.o. MRN: [id number]  CC: Hypertension; Diabetes; and Hyperlipidemia   HPI Brian Hall presents for  follow-up of hypertension. Patient has no history of headache chest pain or shortness of breath or recent cough. Patient also denies symptoms of TIA such as numbness weakness lateralizing. Patient checks  blood pressure at home and has not had any elevated readings recently. Patient denies side effects from his medication. States taking it regularly.  Patient also  in for follow-up of elevated cholesterol. Doing well without complaints on current medication. Denies side effects of statin including myalgia and arthralgia and nausea. Also in today for liver function testing. Currently no chest pain, shortness of breath or other cardiovascular related symptoms noted.  Follow-up of diabetes. Patient does check blood sugar at home. Readings run between  100 and 160.  Patient denies symptoms such as polyuria, polydipsia, excessive hunger, nausea No significant hypoglycemic spells noted. Medications as noted below. Taking them regularly without complication/adverse reaction being reported today.    History Brian Hall has a past medical history of Hypertension; Diabetes mellitus; Obesity; CHF (congestive heart failure) ([place]); SOB (shortness of breath); Hyperkalemia; Renal insufficiency; Cardiogenic shock ([place]); Cardiac LV ejection fraction 10-20%; Hyperlipemia; Hypothyroidism; Throat cancer ([place]) (2011); Blurred vision; Joint pain; Joint swelling; and Stroke ([place]) (09/12/2014).   He has past surgical history that includes Cardiac catheterization and Neck dissection (Right, 2011).   His family history includes Arthritis/Rheumatoid in his brother; Hypertension in his father and mother; Stroke in his father and sister.He reports that he has never smoked. He does not have any smokeless tobacco history on file. He reports that he  does not drink alcohol or use illicit drugs.  Current Outpatient Prescriptions on File Prior to Visit  Medication Sig Dispense Refill  . amLODipine (NORVASC) 5 MG tablet take 1 tablet by mouth once daily (FOR BLOOD PRESSURE) 30 tablet 5  . carvedilol (COREG) 25 MG tablet take 1 tablet by mouth twice a day 60 tablet 5  . cholecalciferol (VITAMIN D) 400 UNITS TABS tablet Take 400 Units by mouth daily.    . clopidogrel (PLAVIX) 75 MG tablet Take 1 tablet (75 mg total) by mouth daily. 30 tablet 5  . glimepiride (AMARYL) 2 MG tablet take 1 tablet by mouth daily WITH BREAKFAST 30 tablet 4  . levothyroxine (SYNTHROID, LEVOTHROID) 88 MCG tablet take 1 tablet by mouth once daily daily BEFORE BREAKFAST 30 tablet 6  . lisinopril (PRINIVIL,ZESTRIL) 40 MG tablet Take 1 tablet (40 mg total) by mouth daily. 30 tablet 5  . Multiple Vitamins-Minerals (MULTIVITAMIN WITH MINERALS) tablet Take 1 tablet by mouth daily.    Marland Kitchen omeprazole (PRILOSEC) 40 MG capsule Take 1 capsule (40 mg total) by mouth daily. 30 capsule 5  . ONE TOUCH ULTRA TEST test strip   1  . pravastatin (PRAVACHOL) 40 MG tablet Take 1 tablet (40 mg total) by mouth daily. 30 tablet 5  . tiZANidine (ZANAFLEX) 2 MG tablet Take 1 tablet (2 mg total) by mouth 3 (three) times daily. 90 tablet 2  . traMADol (ULTRAM) 50 MG tablet take 1 tablet by mouth every 6 hours if needed for MODERATE PAIN 60 tablet 3   No current facility-administered medications on file prior to visit.    ROS Review of Systems  Constitutional: Negative for fever, chills, diaphoresis and unexpected weight change.  HENT: Negative for congestion, hearing loss, rhinorrhea and sore throat.  Eyes: Negative for visual disturbance.  Respiratory: Negative for cough and shortness of breath.   Cardiovascular: Negative for chest pain.  Gastrointestinal: Negative for abdominal pain, diarrhea and constipation.  Genitourinary: Negative for dysuria and flank pain.  Musculoskeletal: Negative  for joint swelling and arthralgias.  Skin: Negative for rash.  Neurological: Positive for weakness (left sided). Negative for dizziness and headaches.  Psychiatric/Behavioral: Negative for sleep disturbance and dysphoric mood.    Objective:  BP 108/62 mmHg  Pulse 71  Temp(Src) 98.6 F (37 C) (Oral)  Ht 5' 11"  (1.803 m)  Wt 261 lb 6.4 oz (118.57 kg)  BMI 36.47 kg/m2  SpO2 98%  BP Readings from Last 3 Encounters:  01/17/16 108/62  01/15/16 129/87  01/15/16 111/65    Wt Readings from Last 3 Encounters:  01/17/16 261 lb 6.4 oz (118.57 kg)  01/15/16 260 lb (117.935 kg)  10/16/15 255 lb 12.8 oz (116.03 kg)     Physical Exam  Constitutional: He is oriented to person, place, and time. He appears well-developed and well-nourished. No distress.  HENT:  Head: Normocephalic and atraumatic.  Right Ear: External ear normal.  Left Ear: External ear normal.  Nose: Nose normal.  Mouth/Throat: Oropharynx is clear and moist.  Eyes: Conjunctivae and EOM are normal. Pupils are equal, round, and reactive to light.  Neck: Normal range of motion. Neck supple. No thyromegaly present.  Cardiovascular: Normal rate, regular rhythm and normal heart sounds.   No murmur heard. Pulmonary/Chest: Effort normal and breath sounds normal. No respiratory distress. He has no wheezes. He has no rales.  Abdominal: Soft. Bowel sounds are normal. He exhibits no distension. There is no tenderness.  Lymphadenopathy:    He has no cervical adenopathy.  Neurological: He is alert and oriented to person, place, and time. He has normal reflexes.  Skin: Skin is warm and dry.  Psychiatric: He has a normal mood and affect. His behavior is normal. Judgment and thought content normal.    Lab Results  Component Value Date   HGBA1C 6.4 10/16/2015   HGBA1C 5.7 07/14/2015   HGBA1C 5.2 04/11/2015    Lab Results  Component Value Date   WBC 8.0 10/16/2015   HGB 14.0* 10/27/2014   HCT 42.3 10/16/2015   PLT 182  10/16/2015   GLUCOSE 136* 10/16/2015   CHOL 149 07/14/2015   TRIG 256* 07/14/2015   HDL 26* 07/14/2015   LDLCALC 72 07/14/2015   ALT 26 10/16/2015   AST 17 10/16/2015   NA 139 10/16/2015   K 4.5 10/16/2015   CL 100 10/16/2015   CREATININE 1.42* 10/16/2015   BUN 30* 10/16/2015   CO2 23 10/16/2015   TSH 3.690 10/27/2014   INR 0.95 09/12/2014   HGBA1C 6.4 10/16/2015    No results found.  Assessment & Plan:   Brian Hall was seen today for hypertension, diabetes and hyperlipidemia.  Diagnoses and all orders for this visit:  Spastic hemiparesis affecting nondominant side (HCC) -     CBC with Differential/Platelet  Left hemiparesis (HCC) -     CBC with Differential/Platelet  Other specified hypothyroidism -     TSH + free T4 -     CBC with Differential/Platelet  Essential hypertension -     CBC with Differential/Platelet  Type 2 diabetes mellitus with peripheral neuropathy (HCC) -     POCT glycosylated hemoglobin (Hb A1C) -     CMP14+EGFR -     Microalbumin / creatinine urine ratio -     Urinalysis -  CBC with Differential/Platelet  Hyperlipemia -     CMP14+EGFR -     Lipid panel -     CBC with Differential/Platelet  Other orders -     saxagliptin HCl (ONGLYZA) 5 MG TABS tablet; Take 1 tablet (5 mg total) by mouth daily. -     tamsulosin (FLOMAX) 0.4 MG CAPS capsule; Take 2 capsules (0.8 mg total) by mouth daily after supper. For urine flow and prostate   I am having Mr. Lilley maintain his multivitamin with minerals, cholecalciferol, ONE TOUCH ULTRA TEST, clopidogrel, levothyroxine, lisinopril, omeprazole, pravastatin, traMADol, carvedilol, amLODipine, glimepiride, tiZANidine, saxagliptin HCl, and tamsulosin.  Meds ordered this encounter  Medications  . saxagliptin HCl (ONGLYZA) 5 MG TABS tablet    Sig: Take 1 tablet (5 mg total) by mouth daily.    Dispense:  30 tablet    Refill:  5  . tamsulosin (FLOMAX) 0.4 MG CAPS capsule    Sig: Take 2 capsules (0.8 mg  total) by mouth daily after supper. For urine flow and prostate    Dispense:  30 capsule    Refill:  5     Follow-up: Return in about 3 months (around 04/18/2016).  Claretta Fraise, M.D.

## 2016-01-17 NOTE — Addendum Note (Signed)
Addended by: Marin Olp on: 01/17/2016 04:39 PM   Modules accepted: Orders

## 2016-01-18 ENCOUNTER — Other Ambulatory Visit: Payer: Self-pay | Admitting: Family Medicine

## 2016-01-18 ENCOUNTER — Other Ambulatory Visit: Payer: BLUE CROSS/BLUE SHIELD

## 2016-01-18 DIAGNOSIS — E039 Hypothyroidism, unspecified: Secondary | ICD-10-CM

## 2016-01-18 LAB — CMP14+EGFR
ALBUMIN: 4.4 g/dL (ref 3.5–5.5)
ALT: 31 IU/L (ref 0–44)
AST: 21 IU/L (ref 0–40)
Albumin/Globulin Ratio: 1.5 (ref 1.2–2.2)
Alkaline Phosphatase: 74 IU/L (ref 39–117)
BILIRUBIN TOTAL: 0.4 mg/dL (ref 0.0–1.2)
BUN / CREAT RATIO: 22 — AB (ref 9–20)
BUN: 33 mg/dL — AB (ref 6–24)
CHLORIDE: 100 mmol/L (ref 96–106)
CO2: 22 mmol/L (ref 18–29)
CREATININE: 1.47 mg/dL — AB (ref 0.76–1.27)
Calcium: 9.8 mg/dL (ref 8.7–10.2)
GFR calc non Af Amer: 52 mL/min/{1.73_m2} — ABNORMAL LOW (ref >59)
GFR, EST AFRICAN AMERICAN: 60 mL/min/{1.73_m2} (ref >59)
GLUCOSE: 135 mg/dL — AB (ref 65–99)
Globulin, Total: 2.9 g/dL (ref 1.5–4.5)
Potassium: 5 mmol/L (ref 3.5–5.2)
Sodium: 140 mmol/L (ref 134–144)
TOTAL PROTEIN: 7.3 g/dL (ref 6.0–8.5)

## 2016-01-18 LAB — URINALYSIS
BILIRUBIN UA: NEGATIVE
GLUCOSE, UA: NEGATIVE
KETONES UA: NEGATIVE
Leukocytes, UA: NEGATIVE
Nitrite, UA: NEGATIVE
PROTEIN UA: NEGATIVE
RBC UA: NEGATIVE
SPEC GRAV UA: 1.02 (ref 1.005–1.030)
Urobilinogen, Ur: 0.2 mg/dL (ref 0.2–1.0)
pH, UA: 5.5 (ref 5.0–7.5)

## 2016-01-18 LAB — LIPID PANEL
Chol/HDL Ratio: 7.5 ratio units — ABNORMAL HIGH (ref 0.0–5.0)
Cholesterol, Total: 157 mg/dL (ref 100–199)
HDL: 21 mg/dL — ABNORMAL LOW (ref >39)
Triglycerides: 507 mg/dL — ABNORMAL HIGH (ref 0–149)

## 2016-01-18 LAB — CBC WITH DIFFERENTIAL/PLATELET
BASOS: 0 %
Basophils Absolute: 0 10*3/uL (ref 0.0–0.2)
EOS (ABSOLUTE): 0.3 10*3/uL (ref 0.0–0.4)
EOS: 3 %
HEMATOCRIT: 40.8 % (ref 37.5–51.0)
HEMOGLOBIN: 13.6 g/dL (ref 12.6–17.7)
Immature Grans (Abs): 0 10*3/uL (ref 0.0–0.1)
Immature Granulocytes: 0 %
LYMPHS ABS: 1.6 10*3/uL (ref 0.7–3.1)
Lymphs: 18 %
MCH: 30.6 pg (ref 26.6–33.0)
MCHC: 33.3 g/dL (ref 31.5–35.7)
MCV: 92 fL (ref 79–97)
MONOCYTES: 9 %
MONOS ABS: 0.8 10*3/uL (ref 0.1–0.9)
NEUTROS ABS: 6.2 10*3/uL (ref 1.4–7.0)
Neutrophils: 70 %
Platelets: 183 10*3/uL (ref 150–379)
RBC: 4.44 x10E6/uL (ref 4.14–5.80)
RDW: 14.1 % (ref 12.3–15.4)
WBC: 8.9 10*3/uL (ref 3.4–10.8)

## 2016-01-18 LAB — TSH+FREE T4
Free T4: 1.3 ng/dL (ref 0.82–1.77)
TSH: 7.1 u[IU]/mL — AB (ref 0.450–4.500)

## 2016-01-19 LAB — MICROALBUMIN / CREATININE URINE RATIO
CREATININE, UR: 63.1 mg/dL
MICROALB/CREAT RATIO: 111.6 mg/g creat — ABNORMAL HIGH (ref 0.0–30.0)
Microalbumin, Urine: 70.4 ug/mL

## 2016-02-02 ENCOUNTER — Other Ambulatory Visit: Payer: Self-pay | Admitting: Family Medicine

## 2016-02-06 ENCOUNTER — Encounter: Payer: Self-pay | Admitting: Registered Nurse

## 2016-02-06 ENCOUNTER — Encounter: Payer: BLUE CROSS/BLUE SHIELD | Attending: Physical Medicine & Rehabilitation | Admitting: Registered Nurse

## 2016-02-06 VITALS — BP 103/71 | HR 97 | Resp 18

## 2016-02-06 DIAGNOSIS — I63511 Cerebral infarction due to unspecified occlusion or stenosis of right middle cerebral artery: Secondary | ICD-10-CM | POA: Insufficient documentation

## 2016-02-06 DIAGNOSIS — G8110 Spastic hemiplegia affecting unspecified side: Secondary | ICD-10-CM | POA: Insufficient documentation

## 2016-02-07 ENCOUNTER — Encounter: Payer: Self-pay | Admitting: Registered Nurse

## 2016-02-08 NOTE — Progress Notes (Signed)
[name] arrived for paperwork completion for [facility].  He is a 60 year old male, he denies any pain at this time. occasional left wrist and left ankle discomfort. His current exercise regime is walking with his walker.  Physical Exam:  He is Alert and Oriented x3. Well developed and nourished HENT: Normocephalic,  Neck: Normal ROM Cardio: S1S2 RRR Pulm:  Lungs CTA Muscular:  Upper Extremities: Right Full ROM and Muscle Strength 5/5 Left: Decreased ROM 45 Degrees and Muscle Strength 0/5,No Grip Noted Lower Extremities: Right: Full ROM and Muscle Strength 5/5 Left: Decreased ROM. Wearing AFO Arises from chair slowly.  Assessment and Plan: 1. Spastic Hemiparesis affecting Nondominant side: S/P Botox  60 minutes of face to face patient care time was spent during this visit. All questions were encouraged and answered.

## 2016-02-26 ENCOUNTER — Ambulatory Visit (HOSPITAL_BASED_OUTPATIENT_CLINIC_OR_DEPARTMENT_OTHER): Payer: BLUE CROSS/BLUE SHIELD | Admitting: Physical Medicine & Rehabilitation

## 2016-02-26 ENCOUNTER — Encounter: Payer: Self-pay | Admitting: Physical Medicine & Rehabilitation

## 2016-02-26 VITALS — BP 146/89 | HR 79 | Resp 14

## 2016-02-26 DIAGNOSIS — G8110 Spastic hemiplegia affecting unspecified side: Secondary | ICD-10-CM

## 2016-02-26 DIAGNOSIS — I63511 Cerebral infarction due to unspecified occlusion or stenosis of right middle cerebral artery: Secondary | ICD-10-CM | POA: Diagnosis not present

## 2016-02-26 NOTE — Progress Notes (Signed)
Subjective:    Patient ID: Brian Hall, male    DOB: 07/17/1956, 60 y.o.   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 He is quite satisfied with his result.  Dosages and muscles included 50 units left FDS 50 units left FDP 25 units left FCR 25 units left FPL 25 units left brachiorad 125 units left biceps  No post injection complications  Pain Inventory Average Pain 1 Pain Right Now 1 My pain is intermittent  In the last 24 hours, has pain interfered with the following? General activity 1 Relation with others 1 Enjoyment of life 1 What TIME of day is your pain at its worst? daytime, night  Sleep (in general) Fair  Pain is worse with: some activites Pain improves with: medication Relief from Meds: 9  Mobility walk with assistance use a cane use a walker how many minutes can you walk? 3-5 ability to climb steps?  yes do you drive?  no use a wheelchair transfers alone Do you have any goals in this area?  yes  Function disabled: date disabled . I need assistance with the following:  dressing, bathing, meal prep, household duties and shopping Do you have any goals in this area?  yes  Neuro/Psych weakness anxiety  Prior Studies Any changes since last visit?  no  Physicians involved in your care Any changes since last visit?  no   Family History  Problem Relation Age of Onset  . Hypertension Mother   . Hypertension Father   . Stroke Father   . Stroke Sister   . Arthritis/Rheumatoid Brother    Social History   Social History  . Marital Status: Legally Separated    Spouse Name: N/A  . Number of Children: 1  . Years of Education: 11   Occupational History  . truck driver, retired    Social History Main Topics  . Smoking status: Never Smoker   . Smokeless tobacco: None  . Alcohol Use: No  . Drug Use: No  . Sexual Activity:  Not Asked   Other Topics Concern  . None   Social History Narrative   Married, 1 daughter   Right handed   Caffeine use - rare tea      Past Surgical History  Procedure Laterality Date  . Cardiac catheterization    . Neck dissection Right 2011    s/p resection of throat cancer with multiple nodes removed   Past Medical History  Diagnosis Date  . Hypertension   . Diabetes mellitus   . Obesity   . CHF (congestive heart failure) ([place])   . SOB (shortness of breath)   . Hyperkalemia   . Renal insufficiency   . Cardiogenic shock ([place])   . Cardiac LV ejection fraction 10-20%   . Hyperlipemia   . Hypothyroidism   . Throat cancer ([place]) 2011    s/p neck dissection, radiation, chemo  . Blurred vision   . Joint pain   . Joint swelling   . Stroke ([place]) 09/12/2014   BP 146/89 mmHg  Pulse 79  Resp 14  SpO2 98%  Opioid Risk Score:   Fall Risk Score:  `1  Depression screen PHQ 2/9  Depression screen [facility] 2/9 02/06/2016 01/17/2016 10/16/2015 07/14/2015 04/14/2015 04/11/2015 12/06/2014  Decreased Interest 0 1 1 1 2 1 2   Down, Depressed, Hopeless 0 0 1 1 0 1 0  PHQ - 2 Score 0 1  2 2 2 2 2   Altered sleeping - - 0 0 - 0 0  Tired, decreased energy - - 0 0 - 0 0  Change in appetite - - 0 0 - 0 0  Feeling bad or failure about yourself  - - 1 0 - 0 1  Trouble concentrating - - 0 0 - 0 0  Moving slowly or fidgety/restless - - 0 0 - 0 2  Suicidal thoughts - - 0 0 - 0 0  PHQ-9 Score - - 3 2 - 2 5  Difficult doing work/chores - - Not difficult at all - - - -     Review of Systems  Constitutional: Positive for unexpected weight change.  HENT: Negative.   Eyes: Negative.   Respiratory: Negative.   Cardiovascular: Positive for leg swelling.  Gastrointestinal: Negative.   Endocrine: Negative.   Genitourinary: Negative.   Musculoskeletal: Positive for gait problem.  Skin: Negative.   Allergic/Immunologic: Negative.   Neurological: Positive for weakness.  Hematological: Negative.     Psychiatric/Behavioral: The patient is nervous/anxious.   All other systems reviewed and are negative.      Objective:   Physical Exam  Constitutional: He is oriented to person, place, and time. He appears well-developed and well-nourished.  HENT:  Head: Normocephalic and atraumatic.  Eyes: Conjunctivae and EOM are normal. Pupils are equal, round, and reactive to light.  Neurological: He is alert and oriented to person, place, and time.  Psychiatric: He has a normal mood and affect.  Nursing note and vitals reviewed.   Ashworth grade 2 spasticity in the finger flexors, Ashworth grade 0 at the wrist flexor however he does have clonus in the wrist flexor Ashworth 0 at the biceps, Ashworth 1 at the thumb flexor   Motor strength is 2 minus of L Toy bicep tricep finger flexors 0 at the finger extensors and wrist extensors. Left leg has Ashworth grade 2 at the toe flexors, with standing patient goes into equinovarus positioning with toe curling    Assessment & Plan:  1. History of right MCA infarct with chronic left spastic hemiplegia. He has done well with the last Botox injection performed 01/15/2016. 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 He may require a posterior AFO rather than the anterior AFO that he currently has. We trialed tibial nerve block to see if that could improve the spasticity and avoid a new orthosis Other alternatives would be to add injection of posterior tibialis muscle and FDL to the next Botox injection

## 2016-02-26 NOTE — Patient Instructions (Signed)
Please elevate the right arm and right leg when sitting for prolonged  time

## 2016-03-03 ENCOUNTER — Other Ambulatory Visit: Payer: Self-pay | Admitting: Physical Medicine & Rehabilitation

## 2016-03-03 ENCOUNTER — Other Ambulatory Visit: Payer: Self-pay | Admitting: Family Medicine

## 2016-03-17 ENCOUNTER — Other Ambulatory Visit: Payer: Self-pay | Admitting: Family Medicine

## 2016-03-20 ENCOUNTER — Telehealth: Payer: Self-pay | Admitting: Physical Medicine & Rehabilitation

## 2016-03-20 NOTE — Telephone Encounter (Signed)
Brian Hall with the [facility] faxed over a premium waiver benefit due to a new change in patients memory and cognition prior to June 2017.  Any questions about form please call her at [phone] ext. [phone].

## 2016-03-25 ENCOUNTER — Encounter (HOSPITAL_COMMUNITY): Payer: Self-pay | Admitting: Emergency Medicine

## 2016-03-25 ENCOUNTER — Emergency Department (HOSPITAL_COMMUNITY): Payer: BLUE CROSS/BLUE SHIELD

## 2016-03-25 ENCOUNTER — Emergency Department (HOSPITAL_COMMUNITY)
Admission: EM | Admit: 2016-03-25 | Discharge: 2016-03-26 | Disposition: A | Discharge status: Home / Self Care | Payer: BLUE CROSS/BLUE SHIELD | Attending: Emergency Medicine | Admitting: Emergency Medicine

## 2016-03-25 ENCOUNTER — Telehealth: Payer: Self-pay | Admitting: Physical Medicine & Rehabilitation

## 2016-03-25 DIAGNOSIS — Z7902 Long term (current) use of antithrombotics/antiplatelets: Secondary | ICD-10-CM | POA: Insufficient documentation

## 2016-03-25 DIAGNOSIS — Z8589 Personal history of malignant neoplasm of other organs and systems: Secondary | ICD-10-CM | POA: Diagnosis not present

## 2016-03-25 DIAGNOSIS — M7989 Other specified soft tissue disorders: Secondary | ICD-10-CM | POA: Insufficient documentation

## 2016-03-25 DIAGNOSIS — I110 Hypertensive heart disease with heart failure: Secondary | ICD-10-CM | POA: Diagnosis not present

## 2016-03-25 DIAGNOSIS — Z7984 Long term (current) use of oral hypoglycemic drugs: Secondary | ICD-10-CM | POA: Diagnosis not present

## 2016-03-25 DIAGNOSIS — E039 Hypothyroidism, unspecified: Secondary | ICD-10-CM | POA: Insufficient documentation

## 2016-03-25 DIAGNOSIS — I509 Heart failure, unspecified: Secondary | ICD-10-CM | POA: Diagnosis not present

## 2016-03-25 DIAGNOSIS — M79662 Pain in left lower leg: Secondary | ICD-10-CM | POA: Insufficient documentation

## 2016-03-25 DIAGNOSIS — E119 Type 2 diabetes mellitus without complications: Secondary | ICD-10-CM | POA: Insufficient documentation

## 2016-03-25 DIAGNOSIS — Z8673 Personal history of transient ischemic attack (TIA), and cerebral infarction without residual deficits: Secondary | ICD-10-CM | POA: Insufficient documentation

## 2016-03-25 LAB — CREATININE, SERUM
CREATININE: 1.66 mg/dL — AB (ref 0.61–1.24)
GFR calc Af Amer: 51 mL/min — ABNORMAL LOW (ref >60)
GFR calc non Af Amer: 44 mL/min — ABNORMAL LOW (ref >60)

## 2016-03-25 MED ORDER — ENOXAPARIN SODIUM 120 MG/0.8ML ~~LOC~~ SOLN
1.0000 mg/kg | Freq: Once | SUBCUTANEOUS | Status: AC
  Administered 2016-03-25: 115 mg via SUBCUTANEOUS
  Filled 2016-03-25: qty 0.8

## 2016-03-25 MED ORDER — OXYCODONE-ACETAMINOPHEN 5-325 MG PO TABS
1.0000 | ORAL_TABLET | ORAL | Status: DC | PRN
  Administered 2016-03-25: 1 via ORAL
  Filled 2016-03-25: qty 1

## 2016-03-25 NOTE — Discharge Instructions (Signed)
IMPORTANT PATIENT INSTRUCTIONS:  ° °You have been scheduled for an Outpatient Vascular Study at [facility].   ° °If tomorrow is a Saturday or Sunday, please go to the [facility] registration desk at 8 AM tomorrow morning and tell them you are therefore a vascular study. ° °If tomorrow is a weekday (Monday - Friday), please go to the [facility] at 8 AM and tell them you are therefore a vascular study ° ° °

## 2016-03-25 NOTE — Telephone Encounter (Signed)
Brian Hall wit [name] needs a call back about a form that was faxed over about patients cognitive instructions.  This is about his premium wavier and they need to know if patient can work any type of job.  This form is due back on 03/26/16 and she needs a call back to confirm form was received, her number is [phone] ext. [phone].

## 2016-03-25 NOTE — Telephone Encounter (Signed)
Please let her know the form was received and will completed and faxed back tomorrow. Thank you

## 2016-03-25 NOTE — ED Triage Notes (Signed)
Pt. Stated, I was at PT and they must have pulled my muscle in my calf and now I have pain all the up to my hip.

## 2016-03-25 NOTE — ED Provider Notes (Signed)
[facility] DEPT Provider Note   CSN: [id number] Arrival date & time: 03/25/16  1107  First Provider Contact:  First MD Initiated Contact with Patient 03/25/16 1930        History   Chief Complaint Chief Complaint  Patient presents with  . Leg Pain    HPI Brian Hall is a 60 y.o. male.  HPI  Brian Hall is a 60 year old male with past medical history of hypertension, hyperlipidemia, diabetes, congestive heart failure and stroke presenting with leg pain. Patient states he had sudden onset left posterior calf pain approximately 2 days ago. The pain radiates up the back of his leg into his hip. He states the pain is constant and exacerbated by weightbearing. Patient states this feels similar to when he tore a muscle and describes it as a cramping sensation. Denies recent injury or overuse of the leg. He also believes that his left knee appears slightly more swollen than baseline. He notes that his left lower extremity is chronically swollen when compared to the right. No change in foot swelling today. Denies history of blood clots. Denies fevers, chills, chest pain, shortness of breath, nausea or vomiting. He has taken tramadol without relief. Patient notes that he since most of his time in his recliner. Reports residual deficits in his lower extremities secondary to a stroke suffered last year causes him to have difficulty ambulating at baseline.   Past Medical History:  Diagnosis Date  . Blurred vision   . Cardiac LV ejection fraction 10-20%   . Cardiogenic shock ([place])   . CHF (congestive heart failure) ([place])   . Diabetes mellitus   . Hyperkalemia   . Hyperlipemia   . Hypertension   . Hypothyroidism   . Joint pain   . Joint swelling   . Obesity   . Renal insufficiency   . SOB (shortness of breath)   . Stroke ([place]) 09/12/2014  . Throat cancer [facility]) 2011   s/p neck dissection, radiation, chemo    Patient Active Problem List   Diagnosis Date Noted  . Spastic  hemiplegia affecting nondominant side ([place]) 02/26/2016  . Hyperlipemia 01/17/2016  . BPH (benign prostatic hyperplasia) 10/16/2015  . 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 Spastic hemiparesis affecting nondominant side ([place]) 11/08/2014  . Hypothyroidism 10/27/2014  . Left-sided neglect 09/23/2014  . Left hemiparesis ([place])   . Right carotid artery occlusion   . Essential hypertension 09/12/2014  . CVA (cerebral infarction) 09/12/2014  . Type 2 diabetes mellitus with peripheral neuropathy ([place]) 09/12/2014    Past Surgical History:  Procedure Laterality Date  . CARDIAC CATHETERIZATION    . NECK DISSECTION Right 2011   s/p resection of throat cancer with multiple nodes removed       Home Medications    Prior to Admission medications   Medication Sig Start Date End Date Taking? Authorizing Provider  amLODipine (NORVASC) 5 MG tablet take 1 tablet by mouth once daily (FOR BLOOD PRESSURE) 10/17/15  Yes Claretta Fraise, MD  carvedilol (COREG) 25 MG tablet take 1 tablet by mouth twice a day 10/17/15  Yes Claretta Fraise, MD  cholecalciferol (VITAMIN D) 400 UNITS TABS tablet Take 400 Units by mouth daily.   Yes Historical Provider, MD  clopidogrel (PLAVIX) 75 MG tablet take 1 tablet by mouth once daily 02/02/16  Yes Claretta Fraise, MD  glimepiride (AMARYL) 2 MG tablet take 1 tablet by mouth daily WITH BREAKFAST 11/03/15  Yes Claretta Fraise, MD  levothyroxine ([place], [place]) 1  MCG tablet take 1 tablet by mouth once daily before BREAKFAST 03/04/16  Yes Claretta Fraise, MD  lisinopril (PRINIVIL,ZESTRIL) 40 MG tablet take 1 tablet by mouth once daily 02/02/16  Yes Claretta Fraise, MD  Multiple Vitamins-Minerals (MULTIVITAMIN WITH MINERALS) tablet Take 1 tablet by mouth daily.   Yes Historical Provider, MD  omeprazole (PRILOSEC) 40 MG capsule take 1 capsule by mouth once daily 03/18/16  Yes Claretta Fraise, MD  pravastatin (PRAVACHOL) 40 MG tablet take 1 tablet by mouth once daily 02/02/16  Yes  Claretta Fraise, MD  saxagliptin HCl (ONGLYZA) 5 MG TABS tablet Take 1 tablet (5 mg total) by mouth daily. 01/17/16  Yes Claretta Fraise, MD  tamsulosin (FLOMAX) 0.4 MG CAPS capsule Take 2 capsules (0.8 mg total) by mouth daily after supper. For urine flow and prostate 01/17/16  Yes Claretta Fraise, MD  tiZANidine (ZANAFLEX) 2 MG tablet take 1 tablet by mouth three times a day Patient taking differently: take 1 tablet by mouth two times a day 03/04/16  Yes Charlett Blake, MD  traMADol Veatrice Bourbon) 50 MG tablet take 1 tablet by mouth every 6 hours if needed for MODERATE PAIN 07/14/15  Yes Claretta Fraise, MD    Family History Family History  Problem Relation Age of Onset  . Hypertension Mother   . Hypertension Father   . Stroke Father   . Stroke Sister   . Arthritis/Rheumatoid Brother     Social History Social History  Substance Use Topics  . Smoking status: Never Smoker  . Smokeless tobacco: Never Used  . Alcohol use No     Allergies   Review of patient's allergies indicates no known allergies.   Review of Systems Review of Systems  Musculoskeletal: Positive for arthralgias and myalgias.  All other systems reviewed and are negative.    Physical Exam Updated Vital Signs BP 111/64   Pulse 71   Temp 98.6 F (37 C) (Oral)   Resp 18   Ht 5\' 11"  (1.803 m)   Wt 113.4 kg   SpO2 96%   BMI 34.87 kg/m   Physical Exam  Constitutional: He appears well-developed and well-nourished. No distress.  HENT:  Head: Normocephalic and atraumatic.  Eyes: Conjunctivae are normal. Right eye exhibits no discharge. Left eye exhibits no discharge. No scleral icterus.  Neck: Normal range of motion.  Cardiovascular: Normal rate, regular rhythm and intact distal pulses.   Pedal pulses palpable  Pulmonary/Chest: Effort normal and breath sounds normal. No respiratory distress.  Musculoskeletal: Normal range of motion.       Left hip: He exhibits normal range of motion and no tenderness.       Left  knee: He exhibits normal range of motion, no effusion and no erythema. Tenderness found.       Left lower leg: He exhibits tenderness and swelling.  Tenderness to palpation of the left posterior calf. Positive Homans. 2+ pitting edema over the left mid foot and ankle which extends into the lower leg. Generalized tenderness of the left knee. Full range motion of the knee intact. No tenderness to palpation of the left hip. Full range of motion of the left hip intact with pain on flexion.  Neurological: He is alert. Coordination normal.  5/5 strength of the bilateral lower extremities. Sensation to light touch intact throughout  Skin: Skin is warm and dry.  No erythema or warmth of the ankle, calf, knee or hip.   Psychiatric: He has a normal mood and affect. His behavior is normal.  Nursing note and vitals reviewed.    ED Treatments / Results  Labs (all labs ordered are listed, but only abnormal results are displayed) Labs Reviewed  CREATININE, SERUM    EKG  EKG Interpretation None       Radiology Dg Knee Complete 4 Views Left  Result Date: 03/25/2016 CLINICAL DATA:  Calf and knee pain post physical therapy. EXAM: LEFT KNEE - COMPLETE 4+ VIEW COMPARISON:  None. FINDINGS: No evidence of fracture, dislocation, or joint effusion. Mild to moderate osteoarthritic changes of all 3 compartments of the left knee joint as seen. Soft tissues are unremarkable. IMPRESSION: No acute fracture or dislocation identified about the left knee. Three compartment mild to moderate osteoarthrosis of the left knee. Electronically Signed   By: Fidela Salisbury M.D.   On: 03/25/2016 20:46  Dg Hip Unilat With Pelvis 2-3 Views Left  Result Date: 03/25/2016 CLINICAL DATA:  Leg pain following physical therapy, initial encounter EXAM: DG HIP (WITH OR WITHOUT PELVIS) 2-3V LEFT COMPARISON:  None. FINDINGS: Degenerative changes of lumbar spine are noted. Pelvic ring is intact. Mild degenerative changes of the hip  joints are seen. No acute fracture or dislocation is noted. No soft tissue changes are noted. IMPRESSION: No acute abnormality seen. Electronically Signed   By: Inez Catalina M.D.   On: 03/25/2016 20:48   Procedures Procedures (including critical care time)  Medications Ordered in ED Medications  oxyCODONE-acetaminophen (PERCOCET/ROXICET) 5-325 MG per tablet 1 tablet (1 tablet Oral Given 03/25/16 1901)  enoxaparin (LOVENOX) injection 115 mg (115 mg Subcutaneous Given 03/25/16 2307)     Initial Impression / Assessment and Plan / ED Course  I have reviewed the triage vital signs and the nursing notes.  Pertinent labs & imaging results that were available during my care of the patient were reviewed by me and considered in my medical decision making (see chart for details).  Clinical Course   60 year old male presenting with posterior calf pain with radiation to the hip 2 days. Afebrile and hemodynamically stable. No hypoxia. Focal tenderness to palpation on the posterior calf with a positive Homans. Tenderness of the left knee with mild swelling noted. Left flexion is neurovascularly intact with full range of motion at all joints. No erythema or warmth suggesting septic joint. X-rays negative. Given patient's presentation and history of largely immobile lifestyle, strong concern for left DVT. Ultrasound has left for the evening's the patient will receive duplex tomorrow morning. Dose of Lovenox given in emergency department tonight. Instructed patient to present to [place] at 8 AM for his ultrasound. Advised to follow up with his PCP with the results. I also discussed reasons to return immediately to the emergency department. Patient states understanding and is stable for discharge.  Final Clinical Impressions(s) / ED Diagnoses   Final diagnoses:  Calf pain, left  Swelling of left lower extremity    New Prescriptions New Prescriptions   No medications on file     Josephina Gip,  PA-C 03/26/16 0001    Sharlett Iles, MD 04/05/16 1540

## 2016-03-25 NOTE — ED Notes (Signed)
Patient transported to X-ray 

## 2016-03-25 NOTE — Telephone Encounter (Signed)
Spoke with Margreta Journey and let her know that the form would be faxed to her tomorrow.

## 2016-03-26 ENCOUNTER — Ambulatory Visit (HOSPITAL_BASED_OUTPATIENT_CLINIC_OR_DEPARTMENT_OTHER)
Admission: RE | Admit: 2016-03-26 | Discharge: 2016-03-26 | Disposition: A | Discharge status: Home / Self Care | Payer: BLUE CROSS/BLUE SHIELD | Source: Ambulatory Visit | Attending: Student | Admitting: Student

## 2016-03-26 DIAGNOSIS — M7989 Other specified soft tissue disorders: Secondary | ICD-10-CM | POA: Insufficient documentation

## 2016-03-26 DIAGNOSIS — M79609 Pain in unspecified limb: Secondary | ICD-10-CM

## 2016-03-26 DIAGNOSIS — M79662 Pain in left lower leg: Secondary | ICD-10-CM

## 2016-03-26 NOTE — Progress Notes (Signed)
*  PRELIMINARY RESULTS* Vascular Ultrasound Left lower extremity venous duplex has been completed.  Preliminary findings: no evidence of DVT. Left baker's cyst noted measuring 6 cm.  Patient instructed to follow up with primary care doctor concerning baker's cyst.  Landry Mellow, RDMS, RVT   03/26/2016, 11:52 AM

## 2016-03-30 ENCOUNTER — Encounter (HOSPITAL_BASED_OUTPATIENT_CLINIC_OR_DEPARTMENT_OTHER): Payer: Self-pay | Admitting: *Deleted

## 2016-03-30 ENCOUNTER — Emergency Department (HOSPITAL_BASED_OUTPATIENT_CLINIC_OR_DEPARTMENT_OTHER)
Admission: EM | Admit: 2016-03-30 | Discharge: 2016-03-30 | Disposition: A | Discharge status: Home / Self Care | Payer: BLUE CROSS/BLUE SHIELD | Attending: Emergency Medicine | Admitting: Emergency Medicine

## 2016-03-30 DIAGNOSIS — E785 Hyperlipidemia, unspecified: Secondary | ICD-10-CM | POA: Insufficient documentation

## 2016-03-30 DIAGNOSIS — K5900 Constipation, unspecified: Secondary | ICD-10-CM | POA: Diagnosis not present

## 2016-03-30 DIAGNOSIS — E119 Type 2 diabetes mellitus without complications: Secondary | ICD-10-CM | POA: Diagnosis not present

## 2016-03-30 DIAGNOSIS — Z7984 Long term (current) use of oral hypoglycemic drugs: Secondary | ICD-10-CM | POA: Diagnosis not present

## 2016-03-30 DIAGNOSIS — Z8673 Personal history of transient ischemic attack (TIA), and cerebral infarction without residual deficits: Secondary | ICD-10-CM | POA: Insufficient documentation

## 2016-03-30 DIAGNOSIS — E039 Hypothyroidism, unspecified: Secondary | ICD-10-CM | POA: Insufficient documentation

## 2016-03-30 DIAGNOSIS — K625 Hemorrhage of anus and rectum: Secondary | ICD-10-CM | POA: Diagnosis present

## 2016-03-30 DIAGNOSIS — Z79899 Other long term (current) drug therapy: Secondary | ICD-10-CM | POA: Insufficient documentation

## 2016-03-30 DIAGNOSIS — Z85818 Personal history of malignant neoplasm of other sites of lip, oral cavity, and pharynx: Secondary | ICD-10-CM | POA: Insufficient documentation

## 2016-03-30 DIAGNOSIS — I509 Heart failure, unspecified: Secondary | ICD-10-CM | POA: Insufficient documentation

## 2016-03-30 DIAGNOSIS — E669 Obesity, unspecified: Secondary | ICD-10-CM | POA: Diagnosis not present

## 2016-03-30 DIAGNOSIS — I110 Hypertensive heart disease with heart failure: Secondary | ICD-10-CM | POA: Insufficient documentation

## 2016-03-30 DIAGNOSIS — Z6837 Body mass index (BMI) 37.0-37.9, adult: Secondary | ICD-10-CM | POA: Diagnosis not present

## 2016-03-30 LAB — CBC WITH DIFFERENTIAL/PLATELET
Basophils Absolute: 0 10*3/uL (ref 0.0–0.1)
Basophils Relative: 0 %
EOS PCT: 3 %
Eosinophils Absolute: 0.3 10*3/uL (ref 0.0–0.7)
HCT: 37.1 % — ABNORMAL LOW (ref 39.0–52.0)
Hemoglobin: 12.5 g/dL — ABNORMAL LOW (ref 13.0–17.0)
LYMPHS ABS: 1 10*3/uL (ref 0.7–4.0)
LYMPHS PCT: 9 %
MCH: 31.5 pg (ref 26.0–34.0)
MCHC: 33.7 g/dL (ref 30.0–36.0)
MCV: 93.5 fL (ref 78.0–100.0)
MONO ABS: 0.9 10*3/uL (ref 0.1–1.0)
Monocytes Relative: 7 %
Neutro Abs: 9.7 10*3/uL — ABNORMAL HIGH (ref 1.7–7.7)
Neutrophils Relative %: 81 %
PLATELETS: 187 10*3/uL (ref 150–400)
RBC: 3.97 MIL/uL — ABNORMAL LOW (ref 4.22–5.81)
RDW: 12.7 % (ref 11.5–15.5)
WBC: 12 10*3/uL — ABNORMAL HIGH (ref 4.0–10.5)

## 2016-03-30 LAB — OCCULT BLOOD X 1 CARD TO LAB, STOOL: Fecal Occult Bld: POSITIVE — AB

## 2016-03-30 NOTE — ED Triage Notes (Signed)
Pt states he was trying to have a bowel movement around 0215 (last BM Wed morning), and passed blood into the toilet at home.

## 2016-03-30 NOTE — ED Provider Notes (Signed)
[place] DEPT [facility] Provider Note   CSN: [id number] Arrival date & time: 03/30/16  0174  First Provider Contact:  None       History   Chief Complaint Chief Complaint  Patient presents with  . Rectal Bleeding    HPI Brian Hall is a 60 y.o. malePMH of stroke on plavix and deficits, here with GI bleeding. He states he had one episode of blood in his stool that occurred earlier this morning.  He denies any SOB, lightheadedness, or fatigue.  He has had constipation as well with straining.  He does not eat a diet igh in fiber and refsers to take stool softeners because he it will make his BMs increase.  He has no further complaints.  10 Systems reviewed and are negative for acute change except as noted in the HPI.   HPI  Past Medical History:  Diagnosis Date  . Blurred vision   . Cardiac LV ejection fraction 10-20%   . Cardiogenic shock ([place])   . CHF (congestive heart failure) ([place])   . Diabetes mellitus   . Hyperkalemia   . Hyperlipemia   . Hypertension   . Hypothyroidism   . Joint pain   . Joint swelling   . Obesity   . Renal insufficiency   . SOB (shortness of breath)   . Stroke ([place]) 09/12/2014  . Throat cancer [facility]) 2011   s/p neck dissection, radiation, chemo    Patient Active Problem List   Diagnosis Date Noted  . Spastic hemiplegia affecting nondominant side ([place]) 02/26/2016  . Hyperlipemia 01/17/2016  . BPH (benign prostatic hyperplasia) 10/16/2015  . 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 Spastic hemiparesis affecting nondominant side ([place]) 11/08/2014  . Hypothyroidism 10/27/2014  . Left-sided neglect 09/23/2014  . Left hemiparesis ([place])   . Right carotid artery occlusion   . Essential hypertension 09/12/2014  . CVA (cerebral infarction) 09/12/2014  . Type 2 diabetes mellitus with peripheral neuropathy ([place]) 09/12/2014    Past Surgical History:  Procedure Laterality Date  . CARDIAC CATHETERIZATION    . NECK DISSECTION Right  2011   s/p resection of throat cancer with multiple nodes removed       Home Medications    Prior to Admission medications   Medication Sig Start Date End Date Taking? Authorizing Provider  amLODipine (NORVASC) 5 MG tablet take 1 tablet by mouth once daily (FOR BLOOD PRESSURE) 10/17/15   Claretta Fraise, MD  carvedilol (COREG) 25 MG tablet take 1 tablet by mouth twice a day 10/17/15   Claretta Fraise, MD  cholecalciferol (VITAMIN D) 400 UNITS TABS tablet Take 400 Units by mouth daily.    Historical Provider, MD  clopidogrel (PLAVIX) 75 MG tablet take 1 tablet by mouth once daily 02/02/16   Claretta Fraise, MD  glimepiride (AMARYL) 2 MG tablet take 1 tablet by mouth daily WITH BREAKFAST 11/03/15   Claretta Fraise, MD  levothyroxine (SYNTHROID, LEVOTHROID) 88 MCG tablet take 1 tablet by mouth once daily before BREAKFAST 03/04/16   Claretta Fraise, MD  lisinopril (PRINIVIL,ZESTRIL) 40 MG tablet take 1 tablet by mouth once daily 02/02/16   Claretta Fraise, MD  Multiple Vitamins-Minerals (MULTIVITAMIN WITH MINERALS) tablet Take 1 tablet by mouth daily.    Historical Provider, MD  omeprazole (PRILOSEC) 40 MG capsule take 1 capsule by mouth once daily 03/18/16   Claretta Fraise, MD  pravastatin (PRAVACHOL) 40 MG tablet take 1 tablet by mouth once daily 02/02/16   Claretta Fraise, MD  saxagliptin HCl (ONGLYZA) 5 MG TABS tablet Take 1 tablet (5 mg total) by mouth daily. 01/17/16   Claretta Fraise, MD  tamsulosin (FLOMAX) 0.4 MG CAPS capsule Take 2 capsules (0.8 mg total) by mouth daily after supper. For urine flow and prostate 01/17/16   Claretta Fraise, MD  tiZANidine (ZANAFLEX) 2 MG tablet take 1 tablet by mouth three times a day Patient taking differently: take 1 tablet by mouth two times a day 03/04/16   Charlett Blake, MD  traMADol Veatrice Bourbon) 50 MG tablet take 1 tablet by mouth every 6 hours if needed for MODERATE PAIN 07/14/15   Claretta Fraise, MD    Family History Family History  Problem Relation Age of Onset  .  Hypertension Mother   . Hypertension Father   . Stroke Father   . Stroke Sister   . Arthritis/Rheumatoid Brother     Social History Social History  Substance Use Topics  . Smoking status: Never Smoker  . Smokeless tobacco: Never Used  . Alcohol use No     Allergies   Review of patient's allergies indicates no known allergies.   Review of Systems Review of Systems   Physical Exam Updated Vital Signs BP 142/83   Pulse 81   Temp 98.3 F (36.8 C) (Oral)   Resp 20   Ht 5' 11"  (1.803 m)   Wt 270 lb (122.5 kg)   SpO2 96%   BMI 37.66 kg/m   Physical Exam  Constitutional: He is oriented to person, place, and time. Vital signs are normal. He appears well-developed and well-nourished.  Non-toxic appearance. He does not appear ill. No distress.  HENT:  Head: Normocephalic and atraumatic.  Nose: Nose normal.  Mouth/Throat: Oropharynx is clear and moist. No oropharyngeal exudate.  Eyes: Conjunctivae and EOM are normal. Pupils are equal, round, and reactive to light. No scleral icterus.  Neck: Normal range of motion. Neck supple. No tracheal deviation, no edema, no erythema and normal range of motion present. No thyroid mass and no thyromegaly present.  Cardiovascular: Normal rate, regular rhythm, S1 normal, S2 normal, normal heart sounds, intact distal pulses and normal pulses.  Exam reveals no gallop and no friction rub.   No murmur heard. Pulmonary/Chest: Effort normal and breath sounds normal. No respiratory distress. He has no wheezes. He has no rhonchi. He has no rales.  Abdominal: Soft. Normal appearance and bowel sounds are normal. He exhibits no distension, no ascites and no mass. There is no hepatosplenomegaly. There is no tenderness. There is no rebound, no guarding and no CVA tenderness.  Genitourinary: Rectum normal. Rectal exam shows guaiac negative stool.  Genitourinary Comments: Hard stool felt in the vault  Musculoskeletal: Normal range of motion. He exhibits no  edema or tenderness.  Lymphadenopathy:    He has no cervical adenopathy.  Neurological: He is alert and oriented to person, place, and time. He has normal strength. No cranial nerve deficit or sensory deficit.  Skin: Skin is warm, dry and intact. No petechiae and no rash noted. He is not diaphoretic. No erythema. No pallor.  Nursing note and vitals reviewed.    ED Treatments / Results  Labs (all labs ordered are listed, but only abnormal results are displayed) Labs Reviewed  CBC WITH DIFFERENTIAL/PLATELET - Abnormal; Notable for the following:       Result Value   WBC 12.0 (*)    RBC 3.97 (*)    Hemoglobin 12.5 (*)    HCT 37.1 (*)    Neutro  Abs 9.7 (*)    All other components within normal limits  OCCULT BLOOD X 1 CARD TO LAB, STOOL - Abnormal; Notable for the following:    Fecal Occult Bld POSITIVE (*)    All other components within normal limits  POC OCCULT BLOOD, ED    EKG  EKG Interpretation None       Radiology No results found.  Procedures Procedures (including critical care time)  Medications Ordered in ED Medications - No data to display   Initial Impression / Assessment and Plan / ED Course  I have reviewed the triage vital signs and the nursing notes.  Pertinent labs & imaging results that were available during my care of the patient were reviewed by me and considered in my medical decision making (see chart for details).  Clinical Course    Patient presents to the ED for blood in stool.  VS are normal and he is not having symptoms of anemia. This was a one time occurrence, likely not a significant bleed.  Likely a teat from his straining and constipation.  Will send hemoccult and CBC for evaluation.  He was educated on diet changes as well as the need for laxatives. He demonstrates good understanding, but states he likely will not change his ways.     4:04 AM hemoccult is positive, Hgb is low normal at 12.5.  His baseline appears around 13, this is a  slight drop.  Patient will need follow up with GI for colonoscopy and to reevaluate his [facility] with neurology for his stroke.  Follow up provided for him.  He appears well and in NAD. Vs remain within his normal limits and he is safe for DC.  Strict return precautions given.  Final Clinical Impressions(s) / ED Diagnoses   Final diagnoses:  Constipation, unspecified constipation type  Rectal bleeding    New Prescriptions New Prescriptions   No medications on file     Everlene Balls, MD 03/30/16 1610

## 2016-04-01 ENCOUNTER — Other Ambulatory Visit: Payer: Self-pay | Admitting: Family Medicine

## 2016-04-01 ENCOUNTER — Encounter: Payer: Self-pay | Admitting: Pediatrics

## 2016-04-01 ENCOUNTER — Ambulatory Visit (INDEPENDENT_AMBULATORY_CARE_PROVIDER_SITE_OTHER): Payer: BLUE CROSS/BLUE SHIELD | Admitting: Pediatrics

## 2016-04-01 VITALS — BP 120/78 | HR 68 | Temp 98.4°F

## 2016-04-01 DIAGNOSIS — H6122 Impacted cerumen, left ear: Secondary | ICD-10-CM

## 2016-04-01 DIAGNOSIS — I10 Essential (primary) hypertension: Secondary | ICD-10-CM

## 2016-04-01 DIAGNOSIS — N189 Chronic kidney disease, unspecified: Secondary | ICD-10-CM | POA: Insufficient documentation

## 2016-04-01 NOTE — Progress Notes (Signed)
    Subjective:    Patient ID: Brian Hall, male    DOB: 25-Jul-1956, 60 y.o.   MRN: [id number]  CC: Left ear stopped up   HPI: Brian Hall is a 60 y.o. male presenting for Left ear stopped up  Has happened before Feels like wax preventing him from being able to hear well No fevers No URI symptoms Doesn't use ear drops or qtips R ear feels slightly stopped up as well  Seen in ED for L knee pain one week ago Was swollen at that time, improved swelling now Still with some pain limiting weight bearing No injury   Depression screen [facility] 2/9 04/01/2016 02/06/2016 01/17/2016 10/16/2015 07/14/2015  Decreased Interest 0 0 1 1 1   Down, Depressed, Hopeless 0 0 0 1 1  PHQ - 2 Score 0 0 1 2 2   Altered sleeping - - - 0 0  Tired, decreased energy - - - 0 0  Change in appetite - - - 0 0  Feeling bad or failure about yourself  - - - 1 0  Trouble concentrating - - - 0 0  Moving slowly or fidgety/restless - - - 0 0  Suicidal thoughts - - - 0 0  PHQ-9 Score - - - 3 2  Difficult doing work/chores - - - Not difficult at all -     Relevant past medical, surgical, family and social history reviewed and updated. Interim medical history since our last visit reviewed. Allergies and medications reviewed and updated.  History  Smoking Status  . Never Smoker  Smokeless Tobacco  . Never Used    ROS: Per HPI      Objective:    BP 120/78 (BP Location: Right Arm, Patient Position: Sitting, Cuff Size: Large)   Pulse 68   Temp 98.4 F (36.9 C) (Oral)     Gen: NAD, alert, cooperative with exam, NCAT EYES: EOMI, no scleral injection or icterus ENT:  L TM with impacted cerumen, R TM visible portion normal, canal obscurred partly by cerumen. OP without erythema LYMPH: no cervical LAD Resp:  normal WOB MSK: no redness L knee    Assessment & Plan:    Brian Hall was seen today for left ear stopped up.  Diagnoses and all orders for this visit:  Impacted cerumen of left ear Irrigated  as below  Essential hypertension Well controlled, cont current medicine  CKD (chronic kidney disease), unspecified stage Avoid ibuprofen, NSAIDs Can try aspercream topical for knee pain  Impacted cerumen removal: After procedure described to patient and patient agreed with proceeding, cerumen impaction was removed from LEFT ear using irrigation.  Pt tolerated procedure well.    Follow up plan: As scheduled  Assunta Found, MD [facility] 04/01/2016, 9:14 PM

## 2016-04-03 ENCOUNTER — Telehealth: Payer: Self-pay | Admitting: Family Medicine

## 2016-04-03 NOTE — Telephone Encounter (Signed)
Wife aware of recommendation per dpr.

## 2016-04-03 NOTE — Telephone Encounter (Signed)
Please contact the patient:  He should use miralax 1 capful twice daily until constipation improves

## 2016-04-15 ENCOUNTER — Other Ambulatory Visit: Payer: Self-pay | Admitting: Family Medicine

## 2016-04-22 ENCOUNTER — Encounter: Payer: Self-pay | Admitting: Family Medicine

## 2016-04-22 ENCOUNTER — Ambulatory Visit (INDEPENDENT_AMBULATORY_CARE_PROVIDER_SITE_OTHER): Payer: BLUE CROSS/BLUE SHIELD | Admitting: Family Medicine

## 2016-04-22 VITALS — BP 118/78 | HR 81 | Temp 98.5°F | Ht 71.0 in | Wt 256.0 lb

## 2016-04-22 DIAGNOSIS — E038 Other specified hypothyroidism: Secondary | ICD-10-CM

## 2016-04-22 DIAGNOSIS — R4586 Emotional lability: Secondary | ICD-10-CM | POA: Diagnosis not present

## 2016-04-22 DIAGNOSIS — G8194 Hemiplegia, unspecified affecting left nondominant side: Secondary | ICD-10-CM

## 2016-04-22 DIAGNOSIS — E1142 Type 2 diabetes mellitus with diabetic polyneuropathy: Secondary | ICD-10-CM

## 2016-04-22 DIAGNOSIS — Z8673 Personal history of transient ischemic attack (TIA), and cerebral infarction without residual deficits: Secondary | ICD-10-CM | POA: Diagnosis not present

## 2016-04-22 DIAGNOSIS — E785 Hyperlipidemia, unspecified: Secondary | ICD-10-CM | POA: Diagnosis not present

## 2016-04-22 LAB — URINALYSIS
BILIRUBIN UA: NEGATIVE
Leukocytes, UA: NEGATIVE
Nitrite, UA: NEGATIVE
PH UA: 5 (ref 5.0–7.5)
SPEC GRAV UA: 1.02 (ref 1.005–1.030)
UUROB: 0.2 mg/dL (ref 0.2–1.0)

## 2016-04-22 LAB — BAYER DCA HB A1C WAIVED: HB A1C: 7 % — AB (ref <7.0)

## 2016-04-22 MED ORDER — PSYLLIUM 58.6 % PO POWD: 2.0000 | Freq: Two times a day (BID) | ORAL | 11 refills | Status: DC

## 2016-04-22 NOTE — Progress Notes (Signed)
Subjective:  Patient ID: Brian Hall, male    DOB: 15-Jul-1956  Age: 60 y.o. MRN: [id number]  CC: Hypertension (3 mth rck); Hyperlipidemia; Diabetes; and Hypothyroidism   HPI Brian Hall presents for  follow-up of hypertension. Patient has no history of headache chest pain or shortness of breath or recent cough. Patient also denies symptoms of TIA such as numbness weakness lateralizing. Patient checks  blood pressure at home and has not had any elevated readings recently. Patient denies side effects from his medication. States taking it regularly.  Patient also  in for follow-up of elevated cholesterol. Doing well without complaints on current medication. Denies side effects of statin including myalgia and arthralgia and nausea. Also in today for liver function testing. Currently no chest pain, shortness of breath or other cardiovascular related symptoms noted.  Follow-up of diabetes. Patient does check blood sugar at home. Readings run between 160 and 200 Patient denies symptoms such as polyuria, polydipsia, excessive hunger, nausea No significant hypoglycemic spells noted. Medications as noted below. Taking them regularly without complication/adverse reaction being reported today.    History Brian Hall has a past medical history of Blurred vision; Cardiac LV ejection fraction 10-20%; Cardiogenic shock ([place]); CHF (congestive heart failure) ([place]); Diabetes mellitus; History of ischemic right MCA stroke (05/26/2015); Hyperkalemia; Hyperlipemia; Hypertension; Hypothyroidism; Joint pain; Joint swelling; Obesity; Renal insufficiency; SOB (shortness of breath); Stroke [facility]) (09/12/2014); and Throat cancer ([place]) (2011).   He has a past surgical history that includes Cardiac catheterization and Neck dissection (Right, 2011).   His family history includes Arthritis/Rheumatoid in his brother; Hypertension in his father and mother; Stroke in his father and sister.He reports that he has never smoked.  He has never used smokeless tobacco. He reports that he does not drink alcohol or use drugs.  Current Outpatient Prescriptions on File Prior to Visit  Medication Sig Dispense Refill  . amLODipine (NORVASC) 5 MG tablet take 1 tablet by mouth daily for blood pressure 30 tablet 0  . carvedilol (COREG) 25 MG tablet take 1 tablet by mouth twice a day 60 tablet 0  . cholecalciferol (VITAMIN D) 400 UNITS TABS tablet Take 400 Units by mouth daily.    . clopidogrel (PLAVIX) 75 MG tablet take 1 tablet by mouth once daily 30 tablet 2  . glimepiride (AMARYL) 2 MG tablet take 1 tablet by mouth daily WITH BREAKFAST 30 tablet 4  . levothyroxine (SYNTHROID, LEVOTHROID) 88 MCG tablet take 1 tablet by mouth once daily before BREAKFAST 30 tablet 10  . lisinopril (PRINIVIL,ZESTRIL) 40 MG tablet take 1 tablet by mouth once daily 30 tablet 5  . Multiple Vitamins-Minerals (MULTIVITAMIN WITH MINERALS) tablet Take 1 tablet by mouth daily.    Marland Kitchen omeprazole (PRILOSEC) 40 MG capsule take 1 capsule by mouth once daily 30 capsule 3  . pravastatin (PRAVACHOL) 40 MG tablet take 1 tablet by mouth once daily 30 tablet 5  . saxagliptin HCl (ONGLYZA) 5 MG TABS tablet Take 1 tablet (5 mg total) by mouth daily. 30 tablet 5  . tamsulosin (FLOMAX) 0.4 MG CAPS capsule Take 2 capsules (0.8 mg total) by mouth daily after supper. For urine flow and prostate 60 capsule 5  . tiZANidine (ZANAFLEX) 2 MG tablet take 1 tablet by mouth three times a day (Patient taking differently: take 1 tablet by mouth two times a day) 90 tablet 2  . traMADol (ULTRAM) 50 MG tablet take 1 tablet by mouth every 6 hours if needed for MODERATE PAIN 60 tablet 3  No current facility-administered medications on file prior to visit.     ROS Review of Systems  Constitutional: Negative for chills, diaphoresis, fever and unexpected weight change.  HENT: Negative for congestion, hearing loss, rhinorrhea and sore throat.   Eyes: Negative for visual disturbance.    Respiratory: Negative for cough and shortness of breath.   Cardiovascular: Negative for chest pain.  Gastrointestinal: Positive for constipation. Negative for abdominal pain and diarrhea.  Genitourinary: Negative for dysuria and flank pain.  Musculoskeletal: Negative for arthralgias and joint swelling.  Skin: Negative for rash.  Neurological: Negative for dizziness and headaches.  Psychiatric/Behavioral: Positive for agitation, behavioral problems and dysphoric mood. Negative for sleep disturbance.    Objective:  BP 118/78 (BP Location: Right Arm, Patient Position: Sitting, Cuff Size: Large)   Pulse 81   Temp 98.5 F (36.9 C) (Oral)   Ht 5' 11"  (1.803 m)   Wt 256 lb (116.1 kg)   SpO2 97%   BMI 35.70 kg/m   BP Readings from Last 3 Encounters:  04/22/16 118/78  04/01/16 120/78  03/30/16 142/83    Wt Readings from Last 3 Encounters:  04/22/16 256 lb (116.1 kg)  03/30/16 270 lb (122.5 kg)  03/25/16 250 lb (113.4 kg)     Physical Exam  Constitutional: He is oriented to person, place, and time. He appears well-developed and well-nourished. No distress.  HENT:  Head: Normocephalic and atraumatic.  Right Ear: External ear normal.  Left Ear: External ear normal.  Nose: Nose normal.  Mouth/Throat: Oropharynx is clear and moist.  Eyes: Conjunctivae and EOM are normal. Pupils are equal, round, and reactive to light.  Neck: Normal range of motion. Neck supple. No thyromegaly present.  Cardiovascular: Normal rate, regular rhythm and normal heart sounds.   No murmur heard. Pulmonary/Chest: Effort normal and breath sounds normal. No respiratory distress. He has no wheezes. He has no rales.  Abdominal: Soft. Bowel sounds are normal. He exhibits no distension. There is no tenderness.  Musculoskeletal: He exhibits no edema or tenderness.  Left hemiplegia, dense  Lymphadenopathy:    He has no cervical adenopathy.  Neurological: He is alert and oriented to person, place, and time. He  has normal reflexes.  Skin: Skin is warm and dry.  Psychiatric: Thought content normal. His mood appears anxious. His affect is labile. He is agitated (at times - when confronted). He expresses impulsivity. He exhibits a depressed mood.   Diabetic Foot Form - Detailed   Diabetic Foot Exam - detailed Is there a history of foot ulcer?:  Yes Can the patient see the bottom of their feet?:  No Are the shoes appropriate in style and fit?:  Yes Is there swelling or and abnormal foot shape?:  No Are the toenails long?:  No Are the toenails thick?:  No Do you have pain in calf while walking?:  No Is there a claw toe deformity?:  No Is there elevated skin temparature?:  No Is there foot or ankle muscle weakness?:  Yes Are the toenails ingrown?:  No Normal Range of Motion:  No Pulse Foot Exam completed.:  Yes  Right posterior Tibialias:  Present Left posterior Tibialias:  Present  Right Dorsalis Pedis:  Present Left Dorsalis Pedis:  Present  Sensory Foot Exam Completed.:  Yes Semmes-Weinstein Monofilament Test R Foot Test Control:  Pos L Foot Test Control:  Pos  R Site 1-Great Toe:  Pos L Site 1-Great Toe:  Neg  R Site 4:  Neg L Site 4:  Neg  R Site 5:  Pos L Site 5:  Neg        Lab Results  Component Value Date   HGBA1C 6.4 10/16/2015   HGBA1C 5.7 07/14/2015   HGBA1C 5.2 04/11/2015    Lab Results  Component Value Date   WBC 12.0 (H) 03/30/2016   HGB 12.5 (L) 03/30/2016   HCT 37.1 (L) 03/30/2016   PLT 187 03/30/2016   GLUCOSE 135 (H) 01/17/2016   CHOL 157 01/17/2016   TRIG 507 (H) 01/17/2016   HDL 21 (L) 01/17/2016   LDLCALC Comment 01/17/2016   ALT 31 01/17/2016   AST 21 01/17/2016   NA 140 01/17/2016   K 5.0 01/17/2016   CL 100 01/17/2016   CREATININE 1.66 (H) 03/25/2016   BUN 33 (H) 01/17/2016   CO2 22 01/17/2016   TSH 7.100 (H) 01/17/2016   INR 0.95 09/12/2014   HGBA1C 6.4 10/16/2015    No results found.  Assessment & Plan:   Brian Hall was seen today for  hypertension, hyperlipidemia, diabetes and hypothyroidism.  Diagnoses and all orders for this visit:  Hyperlipemia -     CMP14+EGFR  History of ischemic right MCA stroke  Other specified hypothyroidism  Left hemiparesis (HCC)  Type 2 diabetes mellitus with peripheral neuropathy (HCC) -     Bayer DCA Hb A1c Waived -     CBC with Differential/Platelet -     CMP14+EGFR -     Microalbumin / creatinine urine ratio -     Urinalysis  Emotional lability  Other orders -     psyllium (METAMUCIL) 58.6 % powder; Take 2 packets by mouth 2 (two) times daily.   I have changed Mr. Baria's psyllium. I am also having him maintain his multivitamin with minerals, cholecalciferol, traMADol, saxagliptin HCl, tamsulosin, clopidogrel, pravastatin, lisinopril, levothyroxine, tiZANidine, omeprazole, glimepiride, carvedilol, amLODipine, Naproxen Sodium, and docusate sodium.  Meds ordered this encounter  Medications  . Naproxen Sodium (ALEVE) 220 MG CAPS    Sig: Take 2 capsules by mouth 2 (two) times daily.  Marland Kitchen docusate sodium (COLACE) 100 MG capsule    Sig: Take 100 mg by mouth 2 (two) times daily.  Marland Kitchen DISCONTD: psyllium (METAMUCIL) 58.6 % powder    Sig: Take 1 packet by mouth 3 times/day as needed-between meals & bedtime.  . psyllium (METAMUCIL) 58.6 % powder    Sig: Take 2 packets by mouth 2 (two) times daily.    Dispense:  283 g    Refill:  11   Counseling referral for emotional lability. May need mood stabilizer  Follow-up: Return in about 3 months (around 07/23/2016).  Claretta Fraise, M.D.

## 2016-04-23 ENCOUNTER — Ambulatory Visit (HOSPITAL_BASED_OUTPATIENT_CLINIC_OR_DEPARTMENT_OTHER): Payer: BLUE CROSS/BLUE SHIELD | Admitting: Physical Medicine & Rehabilitation

## 2016-04-23 ENCOUNTER — Encounter: Payer: BLUE CROSS/BLUE SHIELD | Attending: Physical Medicine & Rehabilitation

## 2016-04-23 ENCOUNTER — Encounter: Payer: Self-pay | Admitting: Physical Medicine & Rehabilitation

## 2016-04-23 DIAGNOSIS — I63511 Cerebral infarction due to unspecified occlusion or stenosis of right middle cerebral artery: Secondary | ICD-10-CM | POA: Insufficient documentation

## 2016-04-23 DIAGNOSIS — G8110 Spastic hemiplegia affecting unspecified side: Secondary | ICD-10-CM | POA: Diagnosis not present

## 2016-04-23 LAB — CMP14+EGFR
ALT: 33 IU/L (ref 0–44)
AST: 25 IU/L (ref 0–40)
Albumin/Globulin Ratio: 1.7 (ref 1.2–2.2)
Albumin: 4.6 g/dL (ref 3.5–5.5)
Alkaline Phosphatase: 76 IU/L (ref 39–117)
BUN/Creatinine Ratio: 19 (ref 9–20)
BUN: 23 mg/dL (ref 6–24)
Bilirubin Total: 0.5 mg/dL (ref 0.0–1.2)
CALCIUM: 9.5 mg/dL (ref 8.7–10.2)
CO2: 23 mmol/L (ref 18–29)
CREATININE: 1.21 mg/dL (ref 0.76–1.27)
Chloride: 99 mmol/L (ref 96–106)
GFR calc Af Amer: 76 mL/min/{1.73_m2} (ref >59)
GFR, EST NON AFRICAN AMERICAN: 66 mL/min/{1.73_m2} (ref >59)
GLUCOSE: 143 mg/dL — AB (ref 65–99)
Globulin, Total: 2.7 g/dL (ref 1.5–4.5)
POTASSIUM: 4.5 mmol/L (ref 3.5–5.2)
Sodium: 139 mmol/L (ref 134–144)
Total Protein: 7.3 g/dL (ref 6.0–8.5)

## 2016-04-23 LAB — MICROALBUMIN / CREATININE URINE RATIO
Creatinine, Urine: 211.6 mg/dL
MICROALB/CREAT RATIO: 47.4 mg/g{creat} — AB (ref 0.0–30.0)
MICROALBUM., U, RANDOM: 100.2 ug/mL

## 2016-04-23 LAB — CBC WITH DIFFERENTIAL/PLATELET
Basophils Absolute: 0 10*3/uL (ref 0.0–0.2)
Basos: 0 %
EOS (ABSOLUTE): 0.4 10*3/uL (ref 0.0–0.4)
Eos: 4 %
Hematocrit: 41.6 % (ref 37.5–51.0)
Hemoglobin: 14 g/dL (ref 12.6–17.7)
IMMATURE GRANULOCYTES: 0 %
Immature Grans (Abs): 0 10*3/uL (ref 0.0–0.1)
Lymphocytes Absolute: 1.7 10*3/uL (ref 0.7–3.1)
Lymphs: 19 %
MCH: 31.5 pg (ref 26.6–33.0)
MCHC: 33.7 g/dL (ref 31.5–35.7)
MCV: 94 fL (ref 79–97)
MONOS ABS: 0.8 10*3/uL (ref 0.1–0.9)
Monocytes: 9 %
NEUTROS PCT: 68 %
Neutrophils Absolute: 6.1 10*3/uL (ref 1.4–7.0)
PLATELETS: 143 10*3/uL — AB (ref 150–379)
RBC: 4.44 x10E6/uL (ref 4.14–5.80)
RDW: 14.5 % (ref 12.3–15.4)
WBC: 8.9 10*3/uL (ref 3.4–10.8)

## 2016-04-23 NOTE — Progress Notes (Signed)
Botox Injection for spasticity using needle EMG guidance  Dilution: 50 Units/ml Indication: Severe spasticity which interferes with ADL,mobility and/or  hygiene and is unresponsive to medication management and other conservative care Informed consent was obtained after describing risks and benefits of the procedure with the patient. This includes bleeding, bruising, infection, excessive weakness, or medication side effects. A REMS form is on file and signed. Needle: 87mm needle electrode Number of units per muscle  Biceps125 Brachioradialis 25 FCR25  FDS50 FDP50 FPL25  All injections were done after obtaining appropriate EMG activity and after negative drawback for blood. The patient tolerated the procedure well. Post procedure instructions were given. A followup appointment was made.

## 2016-04-23 NOTE — Patient Instructions (Signed)

## 2016-04-30 ENCOUNTER — Other Ambulatory Visit: Payer: Self-pay | Admitting: Family Medicine

## 2016-05-20 ENCOUNTER — Other Ambulatory Visit: Payer: Self-pay | Admitting: Family Medicine

## 2016-06-02 ENCOUNTER — Other Ambulatory Visit: Payer: Self-pay | Admitting: Physical Medicine & Rehabilitation

## 2016-06-04 ENCOUNTER — Encounter: Payer: BLUE CROSS/BLUE SHIELD | Attending: Physical Medicine & Rehabilitation

## 2016-06-04 ENCOUNTER — Ambulatory Visit (HOSPITAL_BASED_OUTPATIENT_CLINIC_OR_DEPARTMENT_OTHER): Payer: BLUE CROSS/BLUE SHIELD | Admitting: Physical Medicine & Rehabilitation

## 2016-06-04 ENCOUNTER — Encounter: Payer: Self-pay | Admitting: Physical Medicine & Rehabilitation

## 2016-06-04 VITALS — BP 132/69 | HR 72

## 2016-06-04 DIAGNOSIS — G8110 Spastic hemiplegia affecting unspecified side: Secondary | ICD-10-CM | POA: Insufficient documentation

## 2016-06-04 DIAGNOSIS — G8929 Other chronic pain: Secondary | ICD-10-CM

## 2016-06-04 DIAGNOSIS — I63511 Cerebral infarction due to unspecified occlusion or stenosis of right middle cerebral artery: Secondary | ICD-10-CM | POA: Diagnosis not present

## 2016-06-04 DIAGNOSIS — M5442 Lumbago with sciatica, left side: Secondary | ICD-10-CM

## 2016-06-04 MED ORDER — GABAPENTIN 100 MG PO CAPS: 100.0000 mg | ORAL_CAPSULE | Freq: Three times a day (TID) | ORAL | 1 refills | Status: DC

## 2016-06-04 NOTE — Patient Instructions (Signed)
We'll follow-up in about 2 weeks and go over her MRI results, then we can determine further treatment options

## 2016-06-04 NOTE — Progress Notes (Signed)
Subjective:    Patient ID: Brian Hall, male    DOB: Jul 03, 1956, 60 y.o.   MRN: [id number] 8/22 Biceps125 Brachioradialis 80 FCR25  FDS50 FDP50 FPL25 HPI Still doing home exercise  Having sciatic pain on left side.This has been going on for >18 months. Patient has noticed this since his stroke, which she sustained on 09/12/2014. He went through inpatient rehabilitation from 09/16/2014 at 10/14/2014. He received home health therapy for couple months and then went to outpatient therapy from 12/12/2014 through 09/14/2015. His pain is described as a pinching type of sensation that goes into his buttocks and posterior thigh. It is relieved by changing positions. It limits his ambulation and he feels that if this was improved, he would be walking better. He complained of left calf pain. Earlier this summer. On 03/25/2016, was seen in the emergency department and a vascular ultrasound on 03/26/2016 demonstrated no evidence of DVT Pain Inventory Average Pain 3 Pain Right Now 7 My pain is constant, burning and aching  In the last 24 hours, has pain interfered with the following? General activity 5 Relation with others 5 Enjoyment of life 5 What TIME of day is your pain at its worst? daytime, evening Sleep (in general) Fair  Pain is worse with: walking, standing and some activites Pain improves with: medication Relief from Meds: 5  Mobility walk with assistance use a walker how many minutes can you walk? 3 ability to climb steps?  yes do you drive?  no use a wheelchair transfers alone Do you have any goals in this area?  yes  Function disabled: date disabled 09/11/2014 I need assistance with the following:  dressing, bathing, meal prep, household duties and shopping Do you have any goals in this area?  yes  Neuro/Psych depression anxiety  Prior Studies Any changes since last visit?  no  Physicians involved in your care Any changes since last visit?   no   Family History  Problem Relation Age of Onset  . Hypertension Mother   . Hypertension Father   . Stroke Father   . Stroke Sister   . Arthritis/Rheumatoid Brother    Social History   Social History  . Marital status: Married    Spouse name: N/A  . Number of children: 1  . Years of education: 16   Occupational History  . truck driver, retired    Social History Main Topics  . Smoking status: Never Smoker  . Smokeless tobacco: Never Used  . Alcohol use No  . Drug use: No  . Sexual activity: Not on file   Other Topics Concern  . Not on file   Social History Narrative   Married, 1 daughter   Right handed   Caffeine use - rare tea      Past Surgical History:  Procedure Laterality Date  . CARDIAC CATHETERIZATION    . NECK DISSECTION Right 2011   s/p resection of throat cancer with multiple nodes removed   Past Medical History:  Diagnosis Date  . Blurred vision   . Cardiac LV ejection fraction 10-20%   . Cardiogenic shock ([place])   . CHF (congestive heart failure) ([place])   . Diabetes mellitus   . 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 Hyperkalemia   . Hyperlipemia   . Hypertension   . Hypothyroidism   . Joint pain   . Joint swelling   . Obesity   . Renal insufficiency   . SOB (shortness of breath)   .  Stroke ([place]) 09/12/2014  . Throat cancer ([place]) 2011   s/p neck dissection, radiation, chemo   There were no vitals taken for this visit.  Opioid Risk Score:   Fall Risk Score:  `1  Depression screen PHQ 2/9  Depression screen [facility] 2/9 04/22/2016 04/01/2016 02/06/2016 01/17/2016 10/16/2015 07/14/2015 04/14/2015  Decreased Interest 2 0 0 1 1 1 2   Down, Depressed, Hopeless 3 0 0 0 1 1 0  PHQ - 2 Score 5 0 0 1 2 2 2   Altered sleeping 1 - - - 0 0 -  Tired, decreased energy 1 - - - 0 0 -  Change in appetite 1 - - - 0 0 -  Feeling bad or failure about yourself  0 - - - 1 0 -  Trouble concentrating 0 - - - 0 0 -  Moving slowly or fidgety/restless 0 - -  - 0 0 -  Suicidal thoughts 1 - - - 0 0 -  PHQ-9 Score 9 - - - 3 2 -  Difficult doing work/chores Somewhat difficult - - - Not difficult at all - -    Review of Systems  Constitutional: Positive for unexpected weight change.  Musculoskeletal: Positive for joint swelling.  Psychiatric/Behavioral: Positive for dysphoric mood. The patient is nervous/anxious.   All other systems reviewed and are negative.      Objective:   Physical Exam  Decreased light touch and prick sensation in the left upper and left lower limb. Negative straight leg raising test, bilateral lower extremities. Motor strength is 3 minus in the left deltoid, biceps, triceps, finger flexors, 0 at the finger and wrist extensors. 3 minus. Left hip flexion 4 minus. Left knee extension, trace left ankle dorsiflexion Patient has tenderness palpation around the left PSIS extending into the gluteus medius area. He has no pain with hip or knee range of motion. It is difficult to test his lumbar spine range of motion due to his poor balance. He cannot lean forward or lean backwards. Speech is without evidence of dysarthria or aphasia.     Assessment & Plan:  1. 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 He's had good success with Botox injections and would recommend continuing at the current dosages with a repeat injection in approximately 2 months. 2. Low back as well as left buttock pain. This pain is mainly with standing and is relieved with sitting. Walking also seems to aggravate the pain. He's had complaints of this for quite some time and in fact has the emergency department, where a Doppler venous ultrasound of the lower extremity on the left was negative. Patient's symptoms appear to be related to low back. I suspect that he may have an intermittent lumbar radiculopathy. For this reason, I am ordering MRI of the lumbar spine. Because of his stroke.  It's difficult to evaluate him from a clinical standpoint  He has had physical therapy during the onset of the symptoms. He has had increasing symptoms in the left lower extremity starting in July when he went to the emergency department and underwent Doppler venous ultrasound which was negative for DVT.  Depending on results of MRI. Patient would be a good candidate for epidural steroid injection

## 2016-06-14 ENCOUNTER — Ambulatory Visit
Admission: RE | Admit: 2016-06-14 | Discharge: 2016-06-14 | Disposition: A | Discharge status: Home / Self Care | Payer: BLUE CROSS/BLUE SHIELD | Source: Ambulatory Visit | Attending: Physical Medicine & Rehabilitation | Admitting: Physical Medicine & Rehabilitation

## 2016-06-14 DIAGNOSIS — G8929 Other chronic pain: Secondary | ICD-10-CM

## 2016-06-14 DIAGNOSIS — M5442 Lumbago with sciatica, left side: Principal | ICD-10-CM

## 2016-06-18 ENCOUNTER — Encounter: Payer: Self-pay | Admitting: Physical Medicine & Rehabilitation

## 2016-06-18 ENCOUNTER — Ambulatory Visit (HOSPITAL_BASED_OUTPATIENT_CLINIC_OR_DEPARTMENT_OTHER): Payer: BLUE CROSS/BLUE SHIELD | Admitting: Physical Medicine & Rehabilitation

## 2016-06-18 VITALS — BP 148/81 | HR 67

## 2016-06-18 DIAGNOSIS — M5442 Lumbago with sciatica, left side: Secondary | ICD-10-CM

## 2016-06-18 DIAGNOSIS — I63511 Cerebral infarction due to unspecified occlusion or stenosis of right middle cerebral artery: Secondary | ICD-10-CM | POA: Diagnosis not present

## 2016-06-18 DIAGNOSIS — G8929 Other chronic pain: Secondary | ICD-10-CM

## 2016-06-18 DIAGNOSIS — M48062 Spinal stenosis, lumbar region with neurogenic claudication: Secondary | ICD-10-CM | POA: Diagnosis not present

## 2016-06-18 MED ORDER — GABAPENTIN 300 MG PO CAPS: 300.0000 mg | ORAL_CAPSULE | Freq: Three times a day (TID) | ORAL | 0 refills | Status: DC

## 2016-06-18 MED ORDER — METHYLPREDNISOLONE 4 MG PO TBPK: ORAL_TABLET | ORAL | 0 refills | Status: DC

## 2016-06-18 NOTE — Progress Notes (Signed)
Subjective:    Patient ID: Brian Hall, male    DOB: 15-Jul-1956, 60 y.o.   MRN: [id number]  HPI 60 year old male with history of right MCA infarct with chronic left hemiplegia. He's had a history of increasing left lower extremity pain. He has chronic sensory deficits due to his stroke on the left side of his body as well as chronic left-sided weakness. Has been using an AFO to ambulate as well as a rolling walker. He does have a history of pain going down his left leg that preceded his stroke. He is here to follow-up with MRI scan Pain Inventory Average Pain 6 Pain Right Now 4 My pain is intermittent, dull and aching  In the last 24 hours, has pain interfered with the following? General activity 4 Relation with others 5 Enjoyment of life 5 What TIME of day is your pain at its worst? daytime Sleep (in general) Fair  Pain is worse with: walking, sitting and standing Pain improves with: medication Relief from Meds: 8  Mobility use a walker how many minutes can you walk? 2-3 ability to climb steps?  yes do you drive?  no use a wheelchair transfers alone Do you have any goals in this area?  yes  Function disabled: date disabled 09/12/2014 I need assistance with the following:  dressing, bathing, meal prep, household duties and shopping Do you have any goals in this area?  yes  Neuro/Psych weakness trouble walking depression anxiety  Prior Studies Any changes since last visit?  yes CT/MRI CLINICAL DATA:  Left hip and leg pain.  EXAM: MRI LUMBAR SPINE WITHOUT CONTRAST  TECHNIQUE: Multiplanar, multisequence MR imaging of the lumbar spine was performed. No intravenous contrast was administered.  COMPARISON:  None.  FINDINGS: Segmentation: Normal. The lowest disc space is considered to be L5-S1.  Alignment:  Grade 1 retrolisthesis at L2-L3 and L3-L4.  Vertebrae: Multilevel degenerative endplate signal changes. No focal marrow lesion. No acute  compression fracture.  Conus medullaris: Extends to the L1 level. There is buckling of the cauda equina nerve roots.  Paraspinal and other soft tissues: The visualized aorta, IVC and iliac vessels are normal. The visualized retroperitoneal organs and paraspinal soft tissues are normal.  Disc levels:  T11-T12: Evaluated on sagittal images only. Small disc herniation with bilateral neural foraminal narrowing.  T12-L1:: No significant disc herniation, spinal canal stenosis or neuroforaminal narrowing.  L1-L2: There is a central disc extrusion with mild superior migration. Mild facet hypertrophy. Mild central spinal canal stenosis. Moderate right and mild left neuroforaminal stenosis.  L2-L3: Diffuse disc bulge with central extrusion with components of inferior and superior migration. Bilateral severe hypertrophy to of the fat sat. Severe spinal canal stenosis. Moderate right and severe left neuroforaminal stenosis.  L3-L4: Diffuse disc bulge with superimposed central protrusion and moderate bilateral facet hypertrophy. Bilateral lateral recess narrowing and severe central spinal canal stenosis. Moderate right and severe left neuroforaminal stenosis.  L4-L5: Diffuse disc bulge with superimposed central extrusion with mild superior migration. Moderate bilateral facet hypertrophy. Severe left lateral recess and central spinal canal stenosis. There is a small bilobed synovial cyst arising from the left facet. Moderate right and severe left neural foraminal stenosis.  L5-S1: Narrowing of the disc space with diffuse bulge. Moderate facet hypertrophy. No spinal canal stenosis. Severe bilateral neuroforaminal stenosis.  IMPRESSION: 1. Severe multifactorial spinal canal and lateral recess stenosis at L2-L5 with associated buckling of the proximal cauda equina nerve roots. 2. Moderate right and severe left neural foraminal  stenosis at L2-L3, L3-L4 and L4-L5. 3. Severe  bilateral foraminal stenosis at L5-S1.   Electronically Signed   By: Ulyses Jarred M.D.   On: 06/14/2016 15:26 Physicians involved in your care Any changes since last visit?  no   Family History  Problem Relation Age of Onset  . Hypertension Mother   . Hypertension Father   . Stroke Father   . Stroke Sister   . Arthritis/Rheumatoid Brother    Social History   Social History  . Marital status: Married    Spouse name: N/A  . Number of children: 1  . Years of education: 23   Occupational History  . truck driver, retired    Social History Main Topics  . Smoking status: Never Smoker  . Smokeless tobacco: Never Used  . Alcohol use No  . Drug use: No  . Sexual activity: Not on file   Other Topics Concern  . Not on file   Social History Narrative   Married, 1 daughter   Right handed   Caffeine use - rare tea      Past Surgical History:  Procedure Laterality Date  . CARDIAC CATHETERIZATION    . NECK DISSECTION Right 2011   s/p resection of throat cancer with multiple nodes removed   Past Medical History:  Diagnosis Date  . Blurred vision   . Cardiac LV ejection fraction 10-20%   . Cardiogenic shock ([place])   . CHF (congestive heart failure) ([place])   . Diabetes mellitus   . 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 Hyperkalemia   . Hyperlipemia   . Hypertension   . Hypothyroidism   . Joint pain   . Joint swelling   . Obesity   . Renal insufficiency   . SOB (shortness of breath)   . Stroke ([place]) 09/12/2014  . Throat cancer ([place]) 2011   s/p neck dissection, radiation, chemo   There were no vitals taken for this visit.  Opioid Risk Score:   Fall Risk Score:  `1  Depression screen PHQ 2/9  Depression screen [facility] 2/9 04/22/2016 04/01/2016 02/06/2016 01/17/2016 10/16/2015 07/14/2015 04/14/2015  Decreased Interest 2 0 0 1 1 1 2   Down, Depressed, Hopeless 3 0 0 0 1 1 0  PHQ - 2 Score 5 0 0 1 2 2 2   Altered sleeping 1 - - - 0 0 -  Tired, decreased energy  1 - - - 0 0 -  Change in appetite 1 - - - 0 0 -  Feeling bad or failure about yourself  0 - - - 1 0 -  Trouble concentrating 0 - - - 0 0 -  Moving slowly or fidgety/restless 0 - - - 0 0 -  Suicidal thoughts 1 - - - 0 0 -  PHQ-9 Score 9 - - - 3 2 -  Difficult doing work/chores Somewhat difficult - - - Not difficult at all - -    Review of Systems  Constitutional: Positive for unexpected weight change.  Endocrine:       High blood sugar  Musculoskeletal: Positive for gait problem and joint swelling.  Neurological: Positive for weakness.  Hematological: Bruises/bleeds easily.  Psychiatric/Behavioral: Positive for dysphoric mood. The patient is nervous/anxious.   All other systems reviewed and are negative.      Objective:   Physical Exam  Constitutional: He is oriented to person, place, and time. He appears well-developed and well-nourished.  HENT:  Head: Normocephalic and atraumatic.  Eyes: Conjunctivae  and EOM are normal. Pupils are equal, round, and reactive to light.  Neck: Normal range of motion.  Musculoskeletal: He exhibits no tenderness.  Neurological: He is alert and oriented to person, place, and time. He exhibits abnormal muscle tone. Gait abnormal.  There is increased tone in the flexors in the left upper extremity and increased tone in the extensors of the left lower extremity. 3 minus left hip flexor, 4 minus. Left knee extensor trace ankle dorsiflexion on the left. Wears a left AFO.  Ambulates with a rolling walker with a Velcro handgrip on the left side  Psychiatric: He has a normal mood and affect.  Nursing note and vitals reviewed.         Assessment & Plan:  1. Left spastic hemiplegia, right MCA infarct. 2. Lumbar spinal stenosis. He has lumbar generative disc from L1-L5, as well as lumbar spondylosis resulting in lumbar spinal stenosis, multilevel, but worst at L4-5. Given the multilevel distribution of his abnormalities, as well as his history of stroke  and operative risk, we will treat conservatively for now. He has had no evidence of neurogenic bowel or bladder. No progressive weakness in the left lower or the right lower extremity. Will need to monitor for this.  We'll start out with Medrol Dosepak, informed patient with his diabetes that this would likely increase his blood sugars at least for the first several days while the Dosepak is at higher doses. Increase gabapentin to 300 mg 3 times a day Add tramadol 50 mg twice a day when necessary  If no improvements. Consider epidural injection. However, he would need to stop Plavix and use aspirin 325 mg per day in place of the fourth total of 7 days prior to the injection  Return to clinic in approximately 4-6 weeks for repeat Botox injection, left upper limb

## 2016-06-18 NOTE — Patient Instructions (Signed)
You have pinched nerves from L1-L5. We will start out with a cortisone dose pack. Will also increase gabapentin to 300 mg 3 times a day. You may take tramadol as needed. If this is not helpful, may consider epidural injections. However, we would have to take you off Plavix for 1 week prior to

## 2016-07-14 ENCOUNTER — Other Ambulatory Visit: Payer: Self-pay | Admitting: Family Medicine

## 2016-07-18 ENCOUNTER — Other Ambulatory Visit: Payer: Self-pay | Admitting: Physical Medicine & Rehabilitation

## 2016-07-22 ENCOUNTER — Ambulatory Visit (INDEPENDENT_AMBULATORY_CARE_PROVIDER_SITE_OTHER): Payer: BLUE CROSS/BLUE SHIELD | Admitting: Neurology

## 2016-07-22 ENCOUNTER — Telehealth: Payer: Self-pay | Admitting: Neurology

## 2016-07-22 ENCOUNTER — Encounter: Payer: Self-pay | Admitting: Neurology

## 2016-07-22 VITALS — BP 110/72 | HR 57 | Wt 264.0 lb

## 2016-07-22 DIAGNOSIS — I6521 Occlusion and stenosis of right carotid artery: Secondary | ICD-10-CM | POA: Diagnosis not present

## 2016-07-22 DIAGNOSIS — G8110 Spastic hemiplegia affecting unspecified side: Secondary | ICD-10-CM

## 2016-07-22 NOTE — Telephone Encounter (Signed)
Patient is ready to be scheduled for Doppler. NO PA needed. Thanks Hinton Dyer
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
## 2016-07-23 ENCOUNTER — Telehealth: Payer: Self-pay | Admitting: Neurology

## 2016-07-23 NOTE — Telephone Encounter (Signed)
Spoke with Patient's wife Doppler has been scheduled. NO PA needed.

## 2016-07-23 NOTE — Telephone Encounter (Signed)
Called Patient and left him a message to call me back to schedule his Doppler.

## 2016-07-29 ENCOUNTER — Ambulatory Visit (INDEPENDENT_AMBULATORY_CARE_PROVIDER_SITE_OTHER): Payer: BLUE CROSS/BLUE SHIELD | Admitting: Family Medicine

## 2016-07-29 ENCOUNTER — Other Ambulatory Visit: Payer: Self-pay | Admitting: Family Medicine

## 2016-07-29 ENCOUNTER — Encounter: Payer: Self-pay | Admitting: Family Medicine

## 2016-07-29 VITALS — BP 117/80 | HR 54 | Temp 97.2°F | Ht 71.0 in | Wt 264.0 lb

## 2016-07-29 DIAGNOSIS — E782 Mixed hyperlipidemia: Secondary | ICD-10-CM

## 2016-07-29 DIAGNOSIS — E1142 Type 2 diabetes mellitus with diabetic polyneuropathy: Secondary | ICD-10-CM

## 2016-07-29 DIAGNOSIS — I10 Essential (primary) hypertension: Secondary | ICD-10-CM | POA: Diagnosis not present

## 2016-07-29 DIAGNOSIS — E038 Other specified hypothyroidism: Secondary | ICD-10-CM

## 2016-07-29 DIAGNOSIS — G8110 Spastic hemiplegia affecting unspecified side: Secondary | ICD-10-CM

## 2016-07-29 LAB — BAYER DCA HB A1C WAIVED: HB A1C: 9 % — AB (ref <7.0)

## 2016-07-29 NOTE — Progress Notes (Signed)
Subjective:  Patient ID: Brian Hall, male    DOB: 04-Jan-1956  Age: 60 y.o. MRN: [id number]  CC: Hyperlipidemia   HPI Brian Hall presents for  follow-up of hypertension. Patient has no history of headache chest pain or shortness of breath or recent cough. Patient checks  blood pressure at home and has not had any elevated readings recently. Patient denies side effects from his medication. States taking it regularly.  Patient also  in for follow-up of elevated cholesterol. Doing well without complaints on current medication. Denies side effects of statin including myalgia and arthralgia and nausea. Also in today for liver function testing. Currently no chest pain, shortness of breath or other cardiovascular related symptoms noted.  Follow-up of diabetes. Patient does check blood sugar at home. Readings run between 200  and 300 Patient denies symptoms such as polyuria, polydipsia, excessive hunger, nausea No significant hypoglycemic spells noted. Medications as noted below. Taking them regularly without complication/adverse reaction being reported today.   Left hip 10/10 pain. "Like sciatica." Tolerable with gabapentin. Unable to exercise due to left hemiparesis. Getting Botox injections.  History Brian Hall has a past medical history of Blurred vision; Cardiac LV ejection fraction 10-20%; Cardiogenic shock ([place]); CHF (congestive heart failure) ([place]); Diabetes mellitus; History of ischemic right MCA stroke (05/26/2015); Hyperkalemia; Hyperlipemia; Hypertension; Hypothyroidism; Joint pain; Joint swelling; Obesity; Renal insufficiency; SOB (shortness of breath); Stroke [facility]) (09/12/2014); and Throat cancer ([place]) (2011).   He has a past surgical history that includes Cardiac catheterization and Neck dissection (Right, 2011).   His family history includes Arthritis/Rheumatoid in his brother; Hypertension in his father and mother; Stroke in his father and sister.He reports that he has never  smoked. He has never used smokeless tobacco. He reports that he does not drink alcohol or use drugs.  Current Outpatient Prescriptions on File Prior to Visit  Medication Sig Dispense Refill  . amLODipine (NORVASC) 5 MG tablet take 1 tablet by mouth daily for blood pressure 30 tablet 4  . carvedilol (COREG) 25 MG tablet take 1 tablet by mouth twice a day 60 tablet 4  . cholecalciferol (VITAMIN D) 400 UNITS TABS tablet Take 400 Units by mouth daily.    . clopidogrel (PLAVIX) 75 MG tablet take 1 tablet by mouth daily 30 tablet 5  . gabapentin (NEURONTIN) 300 MG capsule take 1 capsule by mouth three times a day 90 capsule 3  . glimepiride (AMARYL) 2 MG tablet take 1 tablet by mouth daily WITH BREAKFAST 30 tablet 4  . levothyroxine (SYNTHROID, LEVOTHROID) 88 MCG tablet take 1 tablet by mouth once daily before BREAKFAST 30 tablet 10  . Multiple Vitamins-Minerals (MULTIVITAMIN WITH MINERALS) tablet Take 1 tablet by mouth daily.    . Naproxen Sodium (ALEVE) 220 MG CAPS Take 2 capsules by mouth 2 (two) times daily.    Marland Kitchen omeprazole (PRILOSEC) 40 MG capsule take 1 capsule by mouth once daily 30 capsule 3  . ONGLYZA 5 MG TABS tablet take 1 tablet by mouth once daily 30 tablet 2  . tiZANidine (ZANAFLEX) 2 MG tablet take 1 tablet by mouth three times a day 90 tablet 2  . traMADol (ULTRAM) 50 MG tablet take 1 tablet by mouth every 6 hours if needed for MODERATE PAIN 60 tablet 3   No current facility-administered medications on file prior to visit.     ROS Review of Systems  Constitutional: Negative for chills, diaphoresis, fever and unexpected weight change.  HENT: Negative for congestion, hearing loss, rhinorrhea  and sore throat.   Eyes: Negative for visual disturbance.  Respiratory: Negative for cough and shortness of breath.   Cardiovascular: Negative for chest pain.  Gastrointestinal: Negative for abdominal pain, constipation and diarrhea.  Genitourinary: Negative for dysuria and flank pain.    Musculoskeletal: Negative for arthralgias and joint swelling.  Skin: Negative for rash.  Neurological: Positive for weakness. Negative for dizziness and headaches.  Psychiatric/Behavioral: Negative for dysphoric mood and sleep disturbance.    Objective:  BP 117/80   Pulse (!) 54   Temp 97.2 F (36.2 C) (Oral)   Ht 5' 11"  (1.803 m)   Wt 264 lb (119.7 kg)   BMI 36.82 kg/m   BP Readings from Last 3 Encounters:  07/29/16 117/80  07/22/16 110/72  06/18/16 (!) 148/81    Wt Readings from Last 3 Encounters:  07/29/16 264 lb (119.7 kg)  07/22/16 264 lb (119.7 kg)  04/22/16 256 lb (116.1 kg)     Physical Exam  Constitutional: He is oriented to person, place, and time. He appears well-developed and well-nourished. No distress.  HENT:  Head: Normocephalic and atraumatic.  Right Ear: External ear normal.  Left Ear: External ear normal.  Nose: Nose normal.  Mouth/Throat: Oropharynx is clear and moist.  Eyes: Conjunctivae and EOM are normal. Pupils are equal, round, and reactive to light.  Neck: Normal range of motion. Neck supple. No thyromegaly present.  Cardiovascular: Normal rate, regular rhythm and normal heart sounds.   No murmur heard. Pulmonary/Chest: Effort normal and breath sounds normal. No respiratory distress. He has no wheezes. He has no rales.  Abdominal: Soft. Bowel sounds are normal. He exhibits no distension. There is no tenderness.  Lymphadenopathy:    He has no cervical adenopathy.  Neurological: He is alert and oriented to person, place, and time. He has normal reflexes. He exhibits abnormal muscle tone. Coordination abnormal.  Skin: Skin is warm and dry.  Psychiatric: His behavior is normal. Judgment and thought content normal.    Lab Results  Component Value Date   HGBA1C 6.4 10/16/2015   HGBA1C 5.7 07/14/2015   HGBA1C 5.2 04/11/2015    Lab Results  Component Value Date   WBC 8.9 04/22/2016   HGB 12.5 (L) 03/30/2016   HCT 41.6 04/22/2016   PLT  143 (L) 04/22/2016   GLUCOSE 143 (H) 04/22/2016   CHOL 157 01/17/2016   TRIG 507 (H) 01/17/2016   HDL 21 (L) 01/17/2016   LDLCALC Comment 01/17/2016   ALT 33 04/22/2016   AST 25 04/22/2016   NA 139 04/22/2016   K 4.5 04/22/2016   CL 99 04/22/2016   CREATININE 1.21 04/22/2016   BUN 23 04/22/2016   CO2 23 04/22/2016   TSH 7.100 (H) 01/17/2016   INR 0.95 09/12/2014   HGBA1C 6.4 10/16/2015    Mr Lumbar Spine Wo Contrast  Result Date: 06/14/2016 CLINICAL DATA:  Left hip and leg pain. EXAM: MRI LUMBAR SPINE WITHOUT CONTRAST TECHNIQUE: Multiplanar, multisequence MR imaging of the lumbar spine was performed. No intravenous contrast was administered. COMPARISON:  None. FINDINGS: Segmentation: Normal. The lowest disc space is considered to be L5-S1. Alignment:  Grade 1 retrolisthesis at L2-L3 and L3-L4. Vertebrae: Multilevel degenerative endplate signal changes. No focal marrow lesion. No acute compression fracture. Conus medullaris: Extends to the L1 level. There is buckling of the cauda equina nerve roots. Paraspinal and other soft tissues: The visualized aorta, IVC and iliac vessels are normal. The visualized retroperitoneal organs and paraspinal soft tissues are normal. Disc  levels: T11-T12: Evaluated on sagittal images only. Small disc herniation with bilateral neural foraminal narrowing. T12-L1:: No significant disc herniation, spinal canal stenosis or neuroforaminal narrowing. L1-L2: There is a central disc extrusion with mild superior migration. Mild facet hypertrophy. Mild central spinal canal stenosis. Moderate right and mild left neuroforaminal stenosis. L2-L3: Diffuse disc bulge with central extrusion with components of inferior and superior migration. Bilateral severe hypertrophy to of the fat sat. Severe spinal canal stenosis. Moderate right and severe left neuroforaminal stenosis. L3-L4: Diffuse disc bulge with superimposed central protrusion and moderate bilateral facet hypertrophy.  Bilateral lateral recess narrowing and severe central spinal canal stenosis. Moderate right and severe left neuroforaminal stenosis. L4-L5: Diffuse disc bulge with superimposed central extrusion with mild superior migration. Moderate bilateral facet hypertrophy. Severe left lateral recess and central spinal canal stenosis. There is a small bilobed synovial cyst arising from the left facet. Moderate right and severe left neural foraminal stenosis. L5-S1: Narrowing of the disc space with diffuse bulge. Moderate facet hypertrophy. No spinal canal stenosis. Severe bilateral neuroforaminal stenosis. IMPRESSION: 1. Severe multifactorial spinal canal and lateral recess stenosis at L2-L5 with associated buckling of the proximal cauda equina nerve roots. 2. Moderate right and severe left neural foraminal stenosis at L2-L3, L3-L4 and L4-L5. 3. Severe bilateral foraminal stenosis at L5-S1. Electronically Signed   By: Ulyses Jarred M.D.   On: 06/14/2016 15:26    Assessment & Plan:   Rasul was seen today for hyperlipidemia.  Diagnoses and all orders for this visit:  Other specified hypothyroidism  Essential hypertension  Mixed hyperlipidemia  Spastic hemiparesis affecting nondominant side (HCC)  Type 2 diabetes mellitus with peripheral neuropathy ([place]) -     CBC with Differential/Platelet -     CMP14+EGFR -     Lipid panel -     Bayer DCA Hb A1c Waived  Patient expressed signs of depression. He says he has no purpose for living. We discussed this at length. Approximately 45 minutes was spent in contact with this patient over half of it in counseling regarding this issue as well as recommendations for diabetes.   Follow-up: Return in about 2 weeks (around 08/12/2016) for Depression.  Claretta Fraise, M.D.

## 2016-07-30 ENCOUNTER — Encounter: Payer: Self-pay | Admitting: Physical Medicine & Rehabilitation

## 2016-07-30 ENCOUNTER — Ambulatory Visit (HOSPITAL_BASED_OUTPATIENT_CLINIC_OR_DEPARTMENT_OTHER): Payer: BLUE CROSS/BLUE SHIELD | Admitting: Physical Medicine & Rehabilitation

## 2016-07-30 ENCOUNTER — Encounter: Payer: BLUE CROSS/BLUE SHIELD | Attending: Physical Medicine & Rehabilitation

## 2016-07-30 VITALS — BP 129/76 | HR 62

## 2016-07-30 DIAGNOSIS — I63511 Cerebral infarction due to unspecified occlusion or stenosis of right middle cerebral artery: Secondary | ICD-10-CM | POA: Insufficient documentation

## 2016-07-30 DIAGNOSIS — I69398 Other sequelae of cerebral infarction: Secondary | ICD-10-CM | POA: Insufficient documentation

## 2016-07-30 DIAGNOSIS — G8110 Spastic hemiplegia affecting unspecified side: Secondary | ICD-10-CM | POA: Insufficient documentation

## 2016-07-30 DIAGNOSIS — M48062 Spinal stenosis, lumbar region with neurogenic claudication: Secondary | ICD-10-CM | POA: Diagnosis not present

## 2016-07-30 DIAGNOSIS — R269 Unspecified abnormalities of gait and mobility: Secondary | ICD-10-CM | POA: Diagnosis not present

## 2016-07-30 LAB — CMP14+EGFR
ALT: 47 IU/L — AB (ref 0–44)
AST: 36 IU/L (ref 0–40)
Albumin/Globulin Ratio: 1.6 (ref 1.2–2.2)
Albumin: 4.4 g/dL (ref 3.5–5.5)
Alkaline Phosphatase: 76 IU/L (ref 39–117)
BUN/Creatinine Ratio: 16 (ref 9–20)
BUN: 20 mg/dL (ref 6–24)
Bilirubin Total: 0.6 mg/dL (ref 0.0–1.2)
CALCIUM: 9.3 mg/dL (ref 8.7–10.2)
CHLORIDE: 101 mmol/L (ref 96–106)
CO2: 22 mmol/L (ref 18–29)
Creatinine, Ser: 1.23 mg/dL (ref 0.76–1.27)
GFR, EST AFRICAN AMERICAN: 74 mL/min/{1.73_m2} (ref >59)
GFR, EST NON AFRICAN AMERICAN: 64 mL/min/{1.73_m2} (ref >59)
GLUCOSE: 263 mg/dL — AB (ref 65–99)
Globulin, Total: 2.7 g/dL (ref 1.5–4.5)
Potassium: 4.9 mmol/L (ref 3.5–5.2)
Sodium: 143 mmol/L (ref 134–144)
TOTAL PROTEIN: 7.1 g/dL (ref 6.0–8.5)

## 2016-07-30 LAB — CBC WITH DIFFERENTIAL/PLATELET
BASOS ABS: 0 10*3/uL (ref 0.0–0.2)
Basos: 0 %
EOS (ABSOLUTE): 0.2 10*3/uL (ref 0.0–0.4)
Eos: 2 %
Hematocrit: 43.4 % (ref 37.5–51.0)
Hemoglobin: 14.3 g/dL (ref 12.6–17.7)
IMMATURE GRANS (ABS): 0 10*3/uL (ref 0.0–0.1)
IMMATURE GRANULOCYTES: 0 %
LYMPHS: 17 %
Lymphocytes Absolute: 1.7 10*3/uL (ref 0.7–3.1)
MCH: 31.6 pg (ref 26.6–33.0)
MCHC: 32.9 g/dL (ref 31.5–35.7)
MCV: 96 fL (ref 79–97)
Monocytes Absolute: 0.7 10*3/uL (ref 0.1–0.9)
Monocytes: 7 %
NEUTROS PCT: 74 %
Neutrophils Absolute: 7.6 10*3/uL — ABNORMAL HIGH (ref 1.4–7.0)
PLATELETS: 180 10*3/uL (ref 150–379)
RBC: 4.52 x10E6/uL (ref 4.14–5.80)
RDW: 14.3 % (ref 12.3–15.4)
WBC: 10.2 10*3/uL (ref 3.4–10.8)

## 2016-07-30 LAB — LIPID PANEL
CHOL/HDL RATIO: 5.7 ratio — AB (ref 0.0–5.0)
Cholesterol, Total: 155 mg/dL (ref 100–199)
HDL: 27 mg/dL — AB (ref >39)
LDL Calculated: 51 mg/dL (ref 0–99)
TRIGLYCERIDES: 387 mg/dL — AB (ref 0–149)
VLDL CHOLESTEROL CAL: 77 mg/dL — AB (ref 5–40)

## 2016-07-30 MED ORDER — TRAMADOL HCL 50 MG PO TABS: ORAL_TABLET | ORAL | 3 refills | Status: DC

## 2016-07-30 NOTE — Patient Instructions (Signed)
Therapy should call you regarding PT appointment, tramadol twice a day, gabapentin 3 times a day, see me in 6 weeks

## 2016-07-30 NOTE — Progress Notes (Signed)
Botox Injection for spasticity using needle EMG guidance  Dilution: 50 Units/ml Indication: Severe spasticity which interferes with ADL,mobility and/or  hygiene and is unresponsive to medication management and other conservative care Informed consent was obtained after describing risks and benefits of the procedure with the patient. This includes bleeding, bruising, infection, excessive weakness, or medication side effects. A REMS form is on file and signed. Needle: 41mm 25g needle electrode Number of units per muscle LEFT Non Dominant Biceps75 Brachialis 50 Brachioradialis 25 FCR25   FDS50 FDP50 FPL25  All injections were done after obtaining appropriate EMG activity and after negative drawback for blood. The patient tolerated the procedure well. Post procedure instructions were given. A followup appointment was made.   We also discussed his low back pain and recent MRI. Back pain continues to limit the patient's mobility. He is becoming increasingly sedentary. Will prescribe tramadol 50 twice a day with gabapentin 300 3 times a day. Physical therapy to help with mobility. Last PT visit was greater than 1 year ago

## 2016-07-31 ENCOUNTER — Ambulatory Visit (INDEPENDENT_AMBULATORY_CARE_PROVIDER_SITE_OTHER): Payer: BLUE CROSS/BLUE SHIELD

## 2016-07-31 DIAGNOSIS — I6521 Occlusion and stenosis of right carotid artery: Secondary | ICD-10-CM

## 2016-07-31 DIAGNOSIS — Z0289 Encounter for other administrative examinations: Secondary | ICD-10-CM

## 2016-08-09 ENCOUNTER — Ambulatory Visit: Payer: BLUE CROSS/BLUE SHIELD | Attending: Physical Medicine & Rehabilitation | Admitting: Rehabilitation

## 2016-08-09 ENCOUNTER — Encounter: Payer: Self-pay | Admitting: Rehabilitation

## 2016-08-09 DIAGNOSIS — R2681 Unsteadiness on feet: Secondary | ICD-10-CM | POA: Insufficient documentation

## 2016-08-09 DIAGNOSIS — I639 Cerebral infarction, unspecified: Secondary | ICD-10-CM | POA: Diagnosis present

## 2016-08-09 DIAGNOSIS — R2689 Other abnormalities of gait and mobility: Secondary | ICD-10-CM | POA: Insufficient documentation

## 2016-08-09 DIAGNOSIS — IMO0002 Reserved for concepts with insufficient information to code with codable children: Secondary | ICD-10-CM

## 2016-08-09 DIAGNOSIS — R293 Abnormal posture: Secondary | ICD-10-CM | POA: Insufficient documentation

## 2016-08-09 DIAGNOSIS — G8114 Spastic hemiplegia affecting left nondominant side: Secondary | ICD-10-CM | POA: Diagnosis present

## 2016-08-09 DIAGNOSIS — M6281 Muscle weakness (generalized): Secondary | ICD-10-CM | POA: Diagnosis present

## 2016-08-09 NOTE — Therapy (Signed)
[place] [facility] [address] [place], [place], [address] Phone: [phone]   Fax:  [phone]  Physical Therapy Evaluation  Patient Details  Name: Brian Hall MRN: [id number] Date of Birth: 20-Sep-1955 Referring Provider: Alysia Penna, MD  Encounter Date: 08/09/2016      PT End of Session - 08/09/16 1333    Visit Number 1   Number of Visits 17   Date for PT Re-Evaluation 10/08/16   Authorization Type BCBS (30 PT/OT combined visit limit, 29 left for year)   PT Start Time 0932   PT Stop Time 1016   PT Time Calculation (min) 44 min   Activity Tolerance Patient tolerated treatment well   Behavior During Therapy [facility] for tasks assessed/performed      Past Medical History:  Diagnosis Date  . Blurred vision   . Cardiac LV ejection fraction 10-20%   . Cardiogenic shock ([place])   . CHF (congestive heart failure) ([place])   . Diabetes mellitus   . 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 Hyperkalemia   . Hyperlipemia   . Hypertension   . Hypothyroidism   . Joint pain   . Joint swelling   . Obesity   . Renal insufficiency   . SOB (shortness of breath)   . Stroke ([place]) 09/12/2014  . Throat cancer ([place]) 2011   s/p neck dissection, radiation, chemo    Past Surgical History:  Procedure Laterality Date  . CARDIAC CATHETERIZATION    . NECK DISSECTION Right 2011   s/p resection of throat cancer with multiple nodes removed    There were no vitals filed for this visit.       Subjective Assessment - 08/09/16 0938    Subjective "I can't put weight on my left leg because I've got bulging disc in my back that makes my leg hurt so bad when I walk."    Patient is accompained by: Family member   Limitations Walking;House hold activities   Currently in Pain? Yes   Pain Score 2   10/10 pain when standing   Pain Location Hip   Pain Orientation Left   Pain Descriptors / Indicators Stabbing   Pain Type  Chronic pain   Pain Radiating Towards down the leg   Pain Onset More than a month ago   Pain Frequency Intermittent   Aggravating Factors  WB   Pain Relieving Factors sitting            [facility] PT Assessment - 08/09/16 0001      Assessment   Medical Diagnosis spastic hemiplegia   Referring Provider Alysia Penna, MD   Onset Date/Surgical Date --  for past two months, pain so bad unable to [facility]   Prior Therapy acute, IP, OP     Precautions   Precautions Fall     Restrictions   Weight Bearing Restrictions No     Balance Screen   Has the patient fallen in the past 6 months Yes   How many times? 1   Has the patient had a decrease in activity level because of a fear of falling?  Yes   Is the patient reluctant to leave their home because of a fear of falling?  Yes     [place] Private residence   Living Arrangements Spouse/significant other   Available Help at Discharge Family;Available 24 hours/day   Type of [place] entrance   Home  Layout One level   [place] - 2 wheels;Bedside commode;Tub bench     Prior Function   Level of Independence Needs assistance with gait;Independent with transfers;Needs assistance with ADLs  gets assist with dressing   Vocation On disability     Cognition   Overall Cognitive Status History of cognitive impairments - at baseline     Sensation   Light Touch Impaired Detail   Light Touch Impaired Details Impaired LUE;Impaired LLE  can only detect pressure in LLE   Hot/Cold Appears Intact   Proprioception Impaired Detail   Proprioception Impaired Details Impaired LLE     Coordination   Gross Motor Movements are Fluid and Coordinated Yes  LEs   Fine Motor Movements are Fluid and Coordinated No     Tone   Assessment Location Left Lower Extremity     ROM / Strength   AROM / PROM / Strength Strength;PROM     PROM   Overall PROM  Deficits   Overall PROM Comments Limited  motion at L ankle in both DF/PF plane     Strength   Overall Strength Deficits   Overall Strength Comments L hip flex 2+/5, L knee ext 4/5, L knee flex 2/5, L ankle PF/DF 1/5     Transfers   Transfers Sit to Stand;Stand to Sit   Sit to Stand 6: Modified independent (Device/Increase time)   Stand to Sit 6: Modified independent (Device/Increase time)     Ambulation/Gait   Ambulation/Gait Yes   Ambulation/Gait Assistance 5: Supervision   Ambulation/Gait Assistance Details Min cues for improved L hip protraction   Ambulation Distance (Feet) 115 Feet   Assistive device Rolling walker  with L AFO and hand orthosis   Gait Pattern Step-to pattern;Step-through pattern;Decreased stride length;Decreased hip/knee flexion - left;Decreased dorsiflexion - left;Decreased weight shift to left;Lateral hip instability;Trunk rotated posteriorly on left;Trunk flexed   Ambulation Surface Level;Indoor   Gait velocity .88 ft/sec with RW, HO and L AFO     LLE Tone   LLE Tone Mild                   [facility] Adult PT Treatment/Exercise - 08/09/16 1315      Bed Mobility   Bed Mobility Right Sidelying to Sit   Right Sidelying to Sit 4: Min assist   Right Sidelying to Sit Details (indicate cue type and reason) Provided Cues for safe log rol and managment of LUE when rolling to reduce trunk rotation to improve back pain/body mechanics.       Exercises   Exercises Other Exercises   Other Exercises  Performed L hamstring stretch x 2 mins with cues on how to perform at home with wife's assist as well as supine piriformis stretch (knee to opposite shoulder) x 2 mins during session and education to perform at home.                  PT Education - 08/09/16 1332    Education provided Yes   Education Details Education on increased activity and walking at home, stretches, POC, goals   Person(s) Educated Patient;Spouse   Methods Explanation;Demonstration   Comprehension Verbalized  understanding;Returned demonstration          PT Short Term Goals - 08/09/16 1342      PT SHORT TERM GOAL #1   Title Pt will perform HEP with assist from wife in order to improve strength, flexibility and balance.  (Target Date: 09/06/16)   Time 4  Period Weeks   Status New     PT SHORT TERM GOAL #2   Title Will assess TUG time in order to better understand fall risk and set LTG to reflect improvement for decreased fall risk.     Time 4   Period Weeks   Status New     PT SHORT TERM GOAL #3   Title Pt will verbalize compliance with RW use at all times in the home to improve functional endurance.     Time 4   Period Weeks   Status New     PT SHORT TERM GOAL #4   Title Pt will improve gait speed to 1.18 ft/sec in order to indicate decreased fall risk.     Time 4   Period Weeks   Status New           PT Long Term Goals - 08/09/16 1345      PT LONG TERM GOAL #1   Title Pt will be independent with HEP in order to indicate improved functional mobility.  (Target Date: 10/04/16)   Time 8   Period Weeks   Status New     PT LONG TERM GOAL #2   Title Pt will improve gait speed to 1.48 ft/sec in order to indicate decreased fall risk and improved efficiency of gait.     Time 8   Period Weeks   Status New     PT LONG TERM GOAL #3   Title Pt will report no more than 5/10 pain (at worst) in back and LLE during gait in order to indicate that pain is decreased limiting factor in mobility.     Time 8   Period Weeks   Status New     PT LONG TERM GOAL #4   Title Pt will perform up to 500' gait outdoors with LRAD at S level in order to indicate safe return to community.     Time 8   Period Weeks   Status New     PT LONG TERM GOAL #5   Title Pt will report improved contact with friends/family including visiting them/meeting with them in community to promote improved mobility and mental health.     Time 8   Period Weeks   Status New               Plan - 08/09/16 1335     Clinical Impression Statement Pt presents with history of CVA L hemiparesis on 09/12/14.  Pt presents with L sided weakness, increased tone on LLE, decreased timing and coordination of gait, decreased balance, and decreased safety awareness with gait.  PT is familiar with pt and is now presenting with decreased mobility, decreased flexibility, increased back pain (has several bulging disc) and increased L hip and LE pain due to back issues.  Upon PT evaluation, note gait speed is .88 ft/sec placing pt at significant fall risk.  Pt has not been ambulatory around house for past 2 months due to pain, however note that in clinic he is able to ambulate up to 120' with no more than 4/10 pain that decreases instantly with seated rest break.  Provided extensive education on importance of continued mobility for strengthening and cardiovascular improvements as well as stretching for LLE to decrease pain (sciatica) and education on bed mobility to protect back.  Pt is of evolving presentation and moderate complexity from PT POC standpoint.  Pt will benefit from skilled OP neuro PT in order to address deficits.  Rehab Potential Good   Clinical Impairments Affecting Rehab Potential chronicity of deficits, pt motivation   PT Frequency 2x / week   PT Duration 8 weeks   PT Treatment/Interventions ADLs/Self Care Home Management;Electrical Stimulation;Moist Heat;DME Instruction;Gait training;Stair training;Functional mobility training;Therapeutic activities;Therapeutic exercise;Balance training;Neuromuscular re-education;Patient/family education;Orthotic Fit/Training;Passive range of motion;Energy conservation   PT Next Visit Plan TUG, Check compliance with HEP (did not formally print out but hamstring stretch and supine piriformis stretch-add to HEP and give them), bed mobility with emphasis on log roll to protect back, core strength, gait quality and endurance.   Consulted and Agree with Plan of Care Patient;Family  member/caregiver   Family Member Consulted wife sandra      Patient will benefit from skilled therapeutic intervention in order to improve the following deficits and impairments:  Abnormal gait, Decreased activity tolerance, Decreased balance, Decreased coordination, Decreased endurance, Decreased knowledge of precautions, Decreased mobility, Decreased range of motion, Decreased strength, Difficulty walking, Hypomobility, Impaired perceived functional ability, Impaired flexibility, Impaired sensation, Impaired UE functional use, Improper body mechanics, Postural dysfunction, Pain  Visit Diagnosis: 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 Gait disturbance, post-stroke 07/30/2016  . 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 Emotional lability 04/22/2016  . CKD (chronic kidney disease) 04/01/2016  . Spastic hemiplegia affecting nondominant side ([place]) 02/26/2016  . Hyperlipemia 01/17/2016  . BPH (benign prostatic hyperplasia) 10/16/2015  . 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 Spastic hemiparesis affecting nondominant side ([place]) 11/08/2014  . Hypothyroidism 10/27/2014  . Left-sided neglect 09/23/2014  . Left hemiparesis ([place])   . Right carotid artery occlusion   . Essential hypertension 09/12/2014  . Type 2 diabetes mellitus with peripheral neuropathy ([place]) 09/12/2014    Cameron Sprang, PT, MPT [facility] [address] [place], [place], [address] Phone: [phone]   Fax:   [phone] 08/09/16, 1:54 PM  Name: Brian Hall MRN: [id number] Date of Birth: Aug 29, 1956

## 2016-08-12 ENCOUNTER — Ambulatory Visit: Payer: BLUE CROSS/BLUE SHIELD | Admitting: Family Medicine

## 2016-08-12 ENCOUNTER — Ambulatory Visit: Payer: BLUE CROSS/BLUE SHIELD | Admitting: Rehabilitation

## 2016-08-12 ENCOUNTER — Encounter: Payer: Self-pay | Admitting: Rehabilitation

## 2016-08-12 DIAGNOSIS — R2681 Unsteadiness on feet: Secondary | ICD-10-CM

## 2016-08-12 DIAGNOSIS — IMO0002 Reserved for concepts with insufficient information to code with codable children: Secondary | ICD-10-CM

## 2016-08-12 DIAGNOSIS — R2689 Other abnormalities of gait and mobility: Secondary | ICD-10-CM

## 2016-08-12 DIAGNOSIS — G8114 Spastic hemiplegia affecting left nondominant side: Secondary | ICD-10-CM | POA: Diagnosis not present

## 2016-08-12 DIAGNOSIS — R293 Abnormal posture: Secondary | ICD-10-CM

## 2016-08-12 DIAGNOSIS — M6281 Muscle weakness (generalized): Secondary | ICD-10-CM

## 2016-08-12 NOTE — Patient Instructions (Addendum)
Pelvic Tilt: Posterior - Legs Bent (Supine)    Tighten stomach and flatten back by rolling pelvis down. Hold __5__ seconds. Relax. (Make sure that you relax your head/neck and shoulders.   Repeat __10__ times per set. Do _1___ sets per session. Do __2__ sessions per day.  http://orth.exer.us/202   Copyright  VHI. All rights reserved.       Place a small ball underneath the outside edge of you buttock on one side. Lean to that side and roll the ball until your find an are of pain and/or tension. Use your arms to help support your balance.  Stay sitting on the ball for at least 1-2 mins.  Relax then do for another 1-2 mins.  Repeat throughout the day as needed when you feel increased pain.     While seated at the kitchen table, take your right hand over the L hand (make both outstretched) and reach over to the right as far as you can to stretch out the L side of your trunk.  Hold for 10-15 secs (slightly uncomfortable but no painful). Do this 5 times.  Make sure you breath!!!

## 2016-08-12 NOTE — Therapy (Signed)
[place] [address] [place], [place], [address] Phone: [phone]   Fax:  [phone]  Physical Therapy Treatment  Patient Details  Name: Brian Hall MRN: [id number] Date of Birth: [birth year] Referring Provider: Alysia Penna, MD  Encounter Date: 08/12/2016      PT End of Session - 08/12/16 1423    Visit Number 2   Number of Visits 17   Date for PT Re-Evaluation 10/08/16   Authorization Type BC[employer] (30 PT/OT combined visit limit, 29 left for year)   PT Start Time 0847   PT Stop Time 0930   PT Time Calculation (min) 43 min   Activity Tolerance Patient tolerated treatment well   Behavior During Therapy [facility] for tasks assessed/performed      Past Medical History:  Diagnosis Date  . Blurred vision   . Cardiac LV ejection fraction 10-20%   . Cardiogenic shock ([place])   . CHF (congestive heart failure) ([place])   . Diabetes mellitus   . 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 Hyperkalemia   . Hyperlipemia   . Hypertension   . Hypothyroidism   . Joint pain   . Joint swelling   . Obesity   . Renal insufficiency   . SOB (shortness of breath)   . Stroke ([place]) 09/12/2014  . Throat cancer ([place]) 2011   s/p neck dissection, radiation, chemo    Past Surgical History:  Procedure Laterality Date  . CARDIAC CATHETERIZATION    . NECK DISSECTION Right 2011   s/p resection of throat cancer with multiple nodes removed    There were no vitals filed for this visit.      Subjective Assessment - 08/12/16 0852    Subjective "I've been walking more over the weekend to the bathroom and stuff but my back still hurts."   Patient is accompained by: Family member   Limitations Walking;House hold activities   Currently in Pain? Yes   Pain Score 5    Pain Location Back   Pain Orientation Lower;Mid   Pain Descriptors / Indicators Aching   Pain Type Chronic pain   Pain Onset More than a month ago   Pain  Frequency Intermittent   Aggravating Factors  WB   Pain Relieving Factors sitting                         [facility] Adult PT Treatment/Exercise - 08/12/16 0001      Bed Mobility   Bed Mobility Right Sidelying to Sit;Left Sidelying to Sit   Right Sidelying to Sit 4: Min assist   Right Sidelying to Sit Details (indicate cue type and reason) Continues to require cues for log rolling and management of LUE   Left Sidelying to Sit 4: Min assist;3: Mod assist   Left Sidelying to Sit Details (indicate cue type and reason) Cues for LUE management and assist for maintaining forward weight shift when sitting up to EOM.       Transfers   Transfers Sit to Stand;Stand to Sit   Sit to Stand 6: Modified independent (Device/Increase time)   Stand to Sit 6: Modified independent (Device/Increase time)     Ambulation/Gait   Ambulation/Gait Yes   Ambulation/Gait Assistance 5: Supervision;4: Min assist  min A for facilitation   Ambulation/Gait Assistance Details Had pt ambulate following manual therapy for L piriformis release with improvement in pain noted with gait to 3-4/10 vs 6/10 during last session.  continue to provide facilitation for improved L hip protraction.  upright posture and forward progression of gait.     Ambulation Distance (Feet) 115 Feet   Assistive device Rolling walker  L AFO and L HO   Gait Pattern Step-to pattern;Step-through pattern;Decreased stride length;Decreased hip/knee flexion - left;Decreased dorsiflexion - left;Decreased weight shift to left;Lateral hip instability;Trunk rotated posteriorly on left;Trunk flexed   Ambulation Surface Level;Indoor     Exercises   Other Exercises  Performed L piriformis stretch x 2 reps of 1 min each during session with pt and wife reporting that they are doing this at home.  Also had pt perform R  lateral reaching on rolling table to increase L trunk elongation x 10 reps with 5 second holds and intermittent over pressure from PT  for increased stretch.       Manual Therapy   Manual Therapy Myofascial release   Manual therapy comments Performed L piriformis release x 5 mins during session with intermittent pressure as tolerated.  Provided tennis ball (with cut and whole tennis ball) to perform at home and also performed during session to tolerance on mat.  Pt able to tolerate and educated on how to perform at home, see pt instruction.                  PT Education - 08/12/16 1423    Education provided Yes   Education Details Additions to [employer]) Educated Patient   Methods Explanation;Demonstration;Handout   Comprehension Verbalized understanding;Returned demonstration          PT Short Term Goals - 08/12/16 0920      PT SHORT TERM GOAL #1   Title Pt will perform HEP with assist from wife in order to improve strength, flexibility and balance.  (Target Date: 09/06/16)   Time 4   Period Weeks   Status New     PT SHORT TERM GOAL #2   Title Will assess TUG time in order to better understand fall risk and set LTG to reflect improvement for decreased fall risk.     Baseline 40.34 ft/sec with RW (PT holding L hand in place to reduce time) 08/12/16   Time 4   Period Weeks   Status New     PT SHORT TERM GOAL #3   Title Pt will verbalize compliance with RW use at all times in the home to improve functional endurance.     Time 4   Period Weeks   Status New     PT SHORT TERM GOAL #4   Title Pt will improve gait speed to 1.18 ft/sec in order to indicate decreased fall risk.     Time 4   Period Weeks   Status New           PT Long Term Goals - 08/09/16 1345      PT LONG TERM GOAL #1   Title Pt will be independent with HEP in order to indicate improved functional mobility.  (Target Date: 10/04/16)   Time 8   Period Weeks   Status New     PT LONG TERM GOAL #2   Title Pt will improve gait speed to 1.48 ft/sec in order to indicate decreased fall risk and improved efficiency of gait.      Time 8   Period Weeks   Status New     PT LONG TERM GOAL #3   Title Pt will report no more than 5/10 pain (at worst) in back  and LLE during gait in order to indicate that pain is decreased limiting factor in mobility.     Time 8   Period Weeks   Status New     PT LONG TERM GOAL #4   Title Pt will perform up to 500' gait outdoors with LRAD at S level in order to indicate safe return to community.     Time 8   Period Weeks   Status New     PT LONG TERM GOAL #5   Title Pt will report improved contact with friends/family including visiting them/meeting with them in community to promote improved mobility and mental health.     Time 8   Period Weeks   Status New               Plan - 08/12/16 1423    Clinical Impression Statement Skilled session focused on LLE flexibility and stretching along with L piriformis release due to severe tightness and trigger point noted.  Tolerated well.  Gait seemed to be less painful following manual techniques.  Also performed L trunk stretch for elongation.     Rehab Potential Good   Clinical Impairments Affecting Rehab Potential chronicity of deficits, pt motivation   PT Frequency 2x / week   PT Duration 8 weeks   PT Treatment/Interventions ADLs/Self Care Home Management;Electrical Stimulation;Moist Heat;DME Instruction;Gait training;Stair training;Functional mobility training;Therapeutic activities;Therapeutic exercise;Balance training;Neuromuscular re-education;Patient/family education;Orthotic Fit/Training;Passive range of motion;Energy conservation   PT Next Visit Plan Check compliance with HEP (did not formally print out but hamstring stretch and supine piriformis stretch-add to HEP and give them), bed mobility with emphasis on log roll to protect back, core strength, gait quality and endurance.   Consulted and Agree with Plan of Care Patient;Family member/caregiver   Family Member Consulted wife sandra      Patient will benefit from skilled  therapeutic intervention in order to improve the following deficits and impairments:  Abnormal gait, Decreased activity tolerance, Decreased balance, Decreased coordination, Decreased endurance, Decreased knowledge of precautions, Decreased mobility, Decreased range of motion, Decreased strength, Difficulty walking, Hypomobility, Impaired perceived functional ability, Impaired flexibility, Impaired sensation, Impaired UE functional use, Improper body mechanics, Postural dysfunction, Pain  Visit Diagnosis: 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 Gait disturbance, post-stroke 07/30/2016  . 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 Emotional lability 04/22/2016  . CKD (chronic kidney disease) 04/01/2016  . Spastic hemiplegia affecting nondominant side ([place]) 02/26/2016  . Hyperlipemia 01/17/2016  . BPH (benign prostatic hyperplasia) 10/16/2015  . 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 Spastic hemiparesis affecting nondominant side ([place]) 11/08/2014  . Hypothyroidism 10/27/2014  . Left-sided neglect 09/23/2014  . Left hemiparesis ([place])   . Right carotid artery occlusion   . Essential hypertension 09/12/2014  . Type 2 diabetes mellitus with peripheral neuropathy ([place]) 09/12/2014    Cameron Sprang, PT, MPT [facility] [address] [place], [place], [address] Phone: [phone]   Fax:  [phone] 08/12/16, 2:33 PM  Name: Brian Hall MRN: [id number] Date of Birth: 01/29/1956

## 2016-08-16 ENCOUNTER — Encounter: Payer: Self-pay | Admitting: Rehabilitation

## 2016-08-16 ENCOUNTER — Ambulatory Visit: Payer: BLUE CROSS/BLUE SHIELD | Admitting: Rehabilitation

## 2016-08-16 DIAGNOSIS — R2689 Other abnormalities of gait and mobility: Secondary | ICD-10-CM

## 2016-08-16 DIAGNOSIS — IMO0002 Reserved for concepts with insufficient information to code with codable children: Secondary | ICD-10-CM

## 2016-08-16 DIAGNOSIS — R293 Abnormal posture: Secondary | ICD-10-CM

## 2016-08-16 DIAGNOSIS — G8114 Spastic hemiplegia affecting left nondominant side: Secondary | ICD-10-CM | POA: Diagnosis not present

## 2016-08-16 DIAGNOSIS — M6281 Muscle weakness (generalized): Secondary | ICD-10-CM

## 2016-08-16 DIAGNOSIS — R2681 Unsteadiness on feet: Secondary | ICD-10-CM

## 2016-08-16 NOTE — Therapy (Signed)
[facility] [address] [place], [place], [address] Phone: [phone]   Fax:  [phone]  Physical Therapy Treatment  Patient Details  Name: Brian Hall MRN: [id number] Date of Birth: [birth year] Referring Provider: Alysia Penna, MD  Encounter Date: 08/16/2016      PT End of Session - 08/16/16 0842    Visit Number 3   Number of Visits 17   Date for PT Re-Evaluation 10/08/16   Authorization Type BCBS (30 PT/OT combined visit limit, 29 left for year)   PT Start Time 0835   PT Stop Time 0921   PT Time Calculation (min) 46 min   Activity Tolerance Patient tolerated treatment well   Behavior During Therapy [facility] for tasks assessed/performed      Past Medical History:  Diagnosis Date  . Blurred vision   . Cardiac LV ejection fraction 10-20%   . Cardiogenic shock ([place])   . CHF (congestive heart failure) ([place])   . Diabetes mellitus   . 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 Hyperkalemia   . Hyperlipemia   . Hypertension   . Hypothyroidism   . Joint pain   . Joint swelling   . Obesity   . Renal insufficiency   . SOB (shortness of breath)   . Stroke ([place]) 09/12/2014  . Throat cancer ([place]) 2011   s/p neck dissection, radiation, chemo    Past Surgical History:  Procedure Laterality Date  . CARDIAC CATHETERIZATION    . NECK DISSECTION Right 2011   s/p resection of throat cancer with multiple nodes removed    There were no vitals filed for this visit.      Subjective Assessment - 08/16/16 0839    Subjective "I'm still using my chair some in the house."    Patient is accompained by: Family member   Limitations Walking;House hold activities   Currently in Pain? Yes   Pain Score 5    Pain Location Back   Pain Orientation Lower   Pain Descriptors / Indicators Aching   Pain Type Chronic pain   Pain Radiating Towards down the leg   Pain Onset More than a month ago   Pain Frequency  Intermittent   Aggravating Factors  WB, walking   Pain Relieving Factors sitting                         [facility] Adult PT Treatment/Exercise - 08/16/16 0859      Bed Mobility   Bed Mobility Rolling Right   Rolling Right 5: Supervision   Rolling Right Details (indicate cue type and reason) Continues to require cues for management of LUE   Right Sidelying to Sit 4: Min assist   Right Sidelying to Sit Details (indicate cue type and reason) Slight assist to elevate trunk, but marked improved technique from last session.      Transfers   Transfers Sit to Stand;Stand to Sit   Sit to Stand 6: Modified independent (Device/Increase time)   Stand to Sit 6: Modified independent (Device/Increase time)   Comments Pt able to stand at mod I level, however did provide cues for more midline posture when standing for equal WB     Ambulation/Gait   Ambulation/Gait Yes   Ambulation/Gait Assistance 5: Supervision   Ambulation/Gait Assistance Details Cues for posture, but overall pt doing better with L hip protraction.    Ambulation Distance (Feet) 115 Feet   Assistive device Rolling walker  Gait Pattern Step-to pattern;Step-through pattern;Decreased stride length;Decreased hip/knee flexion - left;Decreased dorsiflexion - left;Decreased weight shift to left;Lateral hip instability;Trunk rotated posteriorly on left;Trunk flexed   Ambulation Surface Level;Indoor     Exercises   Exercises Other Exercises   Other Exercises  Performed seated self L piriformis stretch x 2 mins with cues for forward trunk lean at hips rather than rounded posture.  Performd prone trunk extension x 10 reps with 4-5 second holds.  Attempted to have pt elevate trunk and LEs, however due to hemiplegia, unable to do so.  Also had pt perform forward flexion using rolling table with light overpressure from PT for increased low back stretch x 1 min.       Manual Therapy   Manual Therapy Joint mobilization;Soft tissue  mobilization;Myofascial release   Manual therapy comments Performed L piriformis release x 5 mins during session with intermittent pressure as tolerated.     Joint Mobilization Performed grade 1 PA mobilization to increase extension and decrease pain.  Tolerated well during session.    Soft tissue mobilization Performed L paraspinal muscle soft tissue mobilization in prone during session.                  PT Education - 08/16/16 [phone]    Education provided Yes   Education Details education on compliance with HEP and continued recommendation to not use w/c in home.    Person(s) Educated Patient;Spouse   Methods Explanation   Comprehension Verbalized understanding          PT Short Term Goals - 08/12/16 0920      PT SHORT TERM GOAL #1   Title Pt will perform HEP with assist from wife in order to improve strength, flexibility and balance.  (Target Date: 09/06/16)   Time 4   Period Weeks   Status New     PT SHORT TERM GOAL #2   Title Will assess TUG time in order to better understand fall risk and set LTG to reflect improvement for decreased fall risk.     Baseline 40.34 ft/sec with RW (PT holding L hand in place to reduce time) 08/12/16   Time 4   Period Weeks   Status New     PT SHORT TERM GOAL #3   Title Pt will verbalize compliance with RW use at all times in the home to improve functional endurance.     Time 4   Period Weeks   Status New     PT SHORT TERM GOAL #4   Title Pt will improve gait speed to 1.18 ft/sec in order to indicate decreased fall risk.     Time 4   Period Weeks   Status New           PT Long Term Goals - 08/09/16 1345      PT LONG TERM GOAL #1   Title Pt will be independent with HEP in order to indicate improved functional mobility.  (Target Date: 10/04/16)   Time 8   Period Weeks   Status New     PT LONG TERM GOAL #2   Title Pt will improve gait speed to 1.48 ft/sec in order to indicate decreased fall risk and improved efficiency of  gait.     Time 8   Period Weeks   Status New     PT LONG TERM GOAL #3   Title Pt will report no more than 5/10 pain (at worst) in back and LLE during gait in order  to indicate that pain is decreased limiting factor in mobility.     Time 8   Period Weeks   Status New     PT LONG TERM GOAL #4   Title Pt will perform up to 500' gait outdoors with LRAD at S level in order to indicate safe return to community.     Time 8   Period Weeks   Status New     PT LONG TERM GOAL #5   Title Pt will report improved contact with friends/family including visiting them/meeting with them in community to promote improved mobility and mental health.     Time 8   Period Weeks   Status New               Plan - 08/16/16 0859    Clinical Impression Statement Skilled session continues to fucus on LLE flexibility along with L piriformis release, exercises to promote extension to decrease radiculopathy, and manual therapy to decrease pain and improve flexibility.  Pt with some soreness during session, but reports decreased pain with gait when leaving clinic.    Rehab Potential Good   Clinical Impairments Affecting Rehab Potential chronicity of deficits, pt motivation   PT Frequency 2x / week   PT Duration 8 weeks   PT Treatment/Interventions ADLs/Self Care Home Management;Electrical Stimulation;Moist Heat;DME Instruction;Gait training;Stair training;Functional mobility training;Therapeutic activities;Therapeutic exercise;Balance training;Neuromuscular re-education;Patient/family education;Orthotic Fit/Training;Passive range of motion;Energy conservation   PT Next Visit Plan Check compliance with HEP, emphasize gait only in house, no W/c!! bed mobility with emphasis on log roll to protect back, core strength, gait quality and endurance.   Consulted and Agree with Plan of Care Patient;Family member/caregiver   Family Member Consulted wife sandra      Patient will benefit from skilled therapeutic  intervention in order to improve the following deficits and impairments:  Abnormal gait, Decreased activity tolerance, Decreased balance, Decreased coordination, Decreased endurance, Decreased knowledge of precautions, Decreased mobility, Decreased range of motion, Decreased strength, Difficulty walking, Hypomobility, Impaired perceived functional ability, Impaired flexibility, Impaired sensation, Impaired UE functional use, Improper body mechanics, Postural dysfunction, Pain  Visit Diagnosis: 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 Gait disturbance, post-stroke 07/30/2016  . 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 Emotional lability 04/22/2016  . CKD (chronic kidney disease) 04/01/2016  . Spastic hemiplegia affecting nondominant side ([place]) 02/26/2016  . Hyperlipemia 01/17/2016  . BPH (benign prostatic hyperplasia) 10/16/2015  . 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 Spastic hemiparesis affecting nondominant side ([place]) 11/08/2014  . Hypothyroidism 10/27/2014  . Left-sided neglect 09/23/2014  . Left hemiparesis ([place])   . Right carotid artery occlusion   . Essential hypertension 09/12/2014  . Type 2 diabetes mellitus with peripheral neuropathy ([employer]) 09/12/2014    Cameron Sprang, PT, MPT [facility] [address] [place], [place], [address] Phone: [phone]   Fax:  [phone] 08/16/16, 10:45 AM  Name: Brian Hall MRN: [id number] Date of Birth: Nov 20, 1955

## 2016-08-19 ENCOUNTER — Ambulatory Visit: Payer: BLUE CROSS/BLUE SHIELD | Admitting: Rehabilitation

## 2016-08-19 ENCOUNTER — Encounter: Payer: Self-pay | Admitting: Rehabilitation

## 2016-08-19 DIAGNOSIS — R2689 Other abnormalities of gait and mobility: Secondary | ICD-10-CM

## 2016-08-19 DIAGNOSIS — M6281 Muscle weakness (generalized): Secondary | ICD-10-CM

## 2016-08-19 DIAGNOSIS — R293 Abnormal posture: Secondary | ICD-10-CM

## 2016-08-19 DIAGNOSIS — R2681 Unsteadiness on feet: Secondary | ICD-10-CM

## 2016-08-19 DIAGNOSIS — G8114 Spastic hemiplegia affecting left nondominant side: Secondary | ICD-10-CM | POA: Diagnosis not present

## 2016-08-19 DIAGNOSIS — IMO0002 Reserved for concepts with insufficient information to code with codable children: Secondary | ICD-10-CM

## 2016-08-19 NOTE — Therapy (Signed)
[facility] [address] [place], [place], [address] Phone: [phone]   Fax:  [phone]  Physical Therapy Treatment  Patient Details  Name: Brian Hall MRN: [id number] Date of Birth: 1956/04/26 Referring Provider: Alysia Penna, MD  Encounter Date: 08/19/2016      PT End of Session - 08/19/16 1324    Visit Number 4   Number of Visits 17   Date for PT Re-Evaluation 10/08/16   Authorization Type [employer]BS (30 PT/OT combined visit limit, 29 left for year)   PT Start Time 1316   PT Stop Time 1400   PT Time Calculation (min) 44 min   Activity Tolerance Patient tolerated treatment well   Behavior During Therapy [facility] for tasks assessed/performed      Past Medical History:  Diagnosis Date  . Blurred vision   . Cardiac LV ejection fraction 10-20%   . Cardiogenic shock ([place])   . CHF (congestive heart failure) ([place])   . Diabetes mellitus   . 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 Hyperkalemia   . Hyperlipemia   . Hypertension   . Hypothyroidism   . Joint pain   . Joint swelling   . Obesity   . Renal insufficiency   . SOB (shortness of breath)   . Stroke ([place]) 09/12/2014  . Throat cancer ([place]) 2011   s/p neck dissection, radiation, chemo    Past Surgical History:  Procedure Laterality Date  . CARDIAC CATHETERIZATION    . NECK DISSECTION Right 2011   s/p resection of throat cancer with multiple nodes removed    There were no vitals filed for this visit.      Subjective Assessment - 08/19/16 1320    Subjective Pt presents with increased pain today.  Note he was non-ambulatory into clinic today.    Patient is accompained by: Family member   Limitations Walking;House hold activities   Currently in Pain? Yes   Pain Score 6    Pain Location Back   Pain Orientation Left   Pain Descriptors / Indicators Aching   Pain Type Chronic pain   Pain Onset More than a month ago   Pain Frequency  Intermittent   Aggravating Factors  WB, walking   Pain Relieving Factors sitting                         [facility] Adult PT Treatment/Exercise - 08/19/16 0001      Bed Mobility   Bed Mobility Rolling Right   Rolling Right 5: Supervision   Rolling Right Details (indicate cue type and reason) Continues to require cues for management of LUE   Right Sidelying to Sit 5: Supervision     Transfers   Transfers Sit to Stand;Stand to Sit   Sit to Stand 6: Modified independent (Device/Increase time)   Stand to Sit 6: Modified independent (Device/Increase time)     Posture/Postural Control   Posture Comments Went over verbally and demonstrated placing towel roll at small of back in order to improve posture when sitting at home.  Educated that he would likely have to increase size of roll when seated in recliner, but needs to ensure that buttocks stays at back of chair to prevent increased posterior pelvic tilt.       Neuro Re-ed    Neuro Re-ed Details  Worked on trunk shortening and elongation as well as trunk dissociation while seated on green balance disc.  Performed lateral  weight shifts with emphasis on shortening on opposite side of weight shift and elongation on side shifted to.  Pt initially shifting head and shoulders, however did improve with continued practice.  Then worked on seated marching while on disc to improve core activation.  Pt tolerated well.       Exercises   Exercises Other Exercises   Other Exercises  Performed seated piriformis stretch x 2 mins on LLE, seated hamstring stretch x 1 min on LLE, supine pectoral stretch over towel roll x 2 mins with L arm grossly in 90 deg abd as well as RUE.  Provided handouts of all these exercises for increased carryover at home.  Also performed standing hamstring stretch while rolling physioball forward on mat table with emphsis on keeping back straight and bending from hips.  Performed x 3 mins.                  PT  Education - 08/19/16 1526    Education Details continued educaiton on importance of HEP and also increasing gait at home.    Person(s) Educated Patient;Spouse   Methods Explanation;Demonstration;Handout   Comprehension Verbalized understanding;Returned demonstration          PT Short Term Goals - 08/12/16 0920      PT SHORT TERM GOAL #1   Title Pt will perform HEP with assist from wife in order to improve strength, flexibility and balance.  (Target Date: 09/06/16)   Time 4   Period Weeks   Status New     PT SHORT TERM GOAL #2   Title Will assess TUG time in order to better understand fall risk and set LTG to reflect improvement for decreased fall risk.     Baseline 40.34 ft/sec with RW (PT holding L hand in place to reduce time) 08/12/16   Time 4   Period Weeks   Status New     PT SHORT TERM GOAL #3   Title Pt will verbalize compliance with RW use at all times in the home to improve functional endurance.     Time 4   Period Weeks   Status New     PT SHORT TERM GOAL #4   Title Pt will improve gait speed to 1.18 ft/sec in order to indicate decreased fall risk.     Time 4   Period Weeks   Status New           PT Long Term Goals - 08/09/16 1345      PT LONG TERM GOAL #1   Title Pt will be independent with HEP in order to indicate improved functional mobility.  (Target Date: 10/04/16)   Time 8   Period Weeks   Status New     PT LONG TERM GOAL #2   Title Pt will improve gait speed to 1.48 ft/sec in order to indicate decreased fall risk and improved efficiency of gait.     Time 8   Period Weeks   Status New     PT LONG TERM GOAL #3   Title Pt will report no more than 5/10 pain (at worst) in back and LLE during gait in order to indicate that pain is decreased limiting factor in mobility.     Time 8   Period Weeks   Status New     PT LONG TERM GOAL #4   Title Pt will perform up to 500' gait outdoors with LRAD at S level in order to indicate safe return to community.  Time 8   Period Weeks   Status New     PT LONG TERM GOAL #5   Title Pt will report improved contact with friends/family including visiting them/meeting with them in community to promote improved mobility and mental health.     Time 8   Period Weeks   Status New               Plan - 08/19/16 1324    Clinical Impression Statement Skilled session continues to focus on LLE and spinal/trunk flexibility, trunk activation and dissociation to decrease pain with mobility.  Note he is not ambulating very much at home, spent a good amount of time encouraging him to not use w/c in home.    Rehab Potential Good   Clinical Impairments Affecting Rehab Potential chronicity of deficits, pt motivation   PT Frequency 2x / week   PT Duration 8 weeks   PT Treatment/Interventions ADLs/Self Care Home Management;Electrical Stimulation;Moist Heat;DME Instruction;Gait training;Stair training;Functional mobility training;Therapeutic activities;Therapeutic exercise;Balance training;Neuromuscular re-education;Patient/family education;Orthotic Fit/Training;Passive range of motion;Energy conservation   PT Next Visit Plan Check compliance with HEP, emphasize gait only in house, no W/c!! bed mobility with emphasis on log roll to protect back, core strength, gait quality and endurance.   Consulted and Agree with Plan of Care Patient;Family member/caregiver   Family Member Consulted wife sandra      Patient will benefit from skilled therapeutic intervention in order to improve the following deficits and impairments:  Abnormal gait, Decreased activity tolerance, Decreased balance, Decreased coordination, Decreased endurance, Decreased knowledge of precautions, Decreased mobility, Decreased range of motion, Decreased strength, Difficulty walking, Hypomobility, Impaired perceived functional ability, Impaired flexibility, Impaired sensation, Impaired UE functional use, Improper body mechanics, Postural dysfunction,  Pain  Visit Diagnosis: 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 Gait disturbance, post-stroke 07/30/2016  . 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 Emotional lability 04/22/2016  . CKD (chronic kidney disease) 04/01/2016  . Spastic hemiplegia affecting nondominant side ([place]) 02/26/2016  . Hyperlipemia 01/17/2016  . BPH (benign prostatic hyperplasia) 10/16/2015  . 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 Spastic hemiparesis affecting nondominant side ([place]) 11/08/2014  . Hypothyroidism 10/27/2014  . Left-sided neglect 09/23/2014  . Left hemiparesis ([place])   . Right carotid artery occlusion   . Essential hypertension 09/12/2014  . Type 2 diabetes mellitus with peripheral neuropathy ([place]) 09/12/2014    Cameron Sprang, PT, MPT [facility] [address] [place], [place], [address] Phone: [phone]   Fax:  [phone] 08/19/16, 3:29 PM  Name: MARQUIL MINE MRN: [id number] Date of Birth: May 16, 1956

## 2016-08-19 NOTE — Patient Instructions (Signed)
Chair Sitting    Sit at edge of seat, spine straight, one leg extended. Put a hand on each thigh and bend forward from the hip, keeping spine straight. Allow hand on extended leg to reach toward toes. Support upper body with other arm. Hold _60__ seconds. Repeat __2-3_ times per session. Do _2-3__ sessions per day.  Copyright  VHI. All rights reserved.   Hip Stretch    Put left ankle over right knee. Let left knee fall downward, but keep ankle in place. Feel the stretch in hip. May push down gently with hand to feel stretch. Hold _60___ seconds while counting out loud. Sit up TALL!! Repeat _2-3___ times. Do _2-3___ sessions per day.  http://gt2.exer.us/497   Copyright  VHI. All rights reserved.      Foam roll series  Lay on the foam roll parallel to your spine.  Your hips and head should be supported on the foam roll.    Start with a pec stretch for approximately 1-2 minutes.  The angle of your arms will stretch different fibers in your pectoral muscle.  Start with T.  Avoid any shoulder discomfort.

## 2016-08-20 ENCOUNTER — Telehealth: Payer: Self-pay | Admitting: Neurology

## 2016-08-20 NOTE — Telephone Encounter (Signed)
Pt's wife request doppler results

## 2016-08-20 NOTE — Telephone Encounter (Signed)
Per Dr. Leonie Man review of TCD on 07/31/2016 patients doppler showed mild hardening of the blood vessels in the brain. No major blockages. No worrisome findings.

## 2016-08-20 NOTE — Telephone Encounter (Signed)
Rn call patients wife about the tcd and carotid doppler results. Rn stated the carotid doppler was negative. Pts wife verbalized understanding.  Rn stated the TCD only showed mild hardening of the blood vessels. Nothing to worry about. Pt wife verbalized understanding.

## 2016-08-20 NOTE — Telephone Encounter (Signed)
Dr. Leonie Man stated note on the carotid doppler was negative.

## 2016-08-23 ENCOUNTER — Ambulatory Visit: Payer: BLUE CROSS/BLUE SHIELD | Admitting: Rehabilitation

## 2016-08-23 ENCOUNTER — Encounter: Payer: Self-pay | Admitting: Rehabilitation

## 2016-08-23 DIAGNOSIS — R2689 Other abnormalities of gait and mobility: Secondary | ICD-10-CM

## 2016-08-23 DIAGNOSIS — G8114 Spastic hemiplegia affecting left nondominant side: Secondary | ICD-10-CM | POA: Diagnosis not present

## 2016-08-23 DIAGNOSIS — R2681 Unsteadiness on feet: Secondary | ICD-10-CM

## 2016-08-23 DIAGNOSIS — IMO0002 Reserved for concepts with insufficient information to code with codable children: Secondary | ICD-10-CM

## 2016-08-23 DIAGNOSIS — M6281 Muscle weakness (generalized): Secondary | ICD-10-CM

## 2016-08-23 NOTE — Patient Instructions (Signed)
Bracing With Bridging (Hook-Lying)    With neutral spine, tighten pelvic floor and abdominals and hold. Lift bottom. Repeat __10_ times. Do _1-2__ times a day.  Make sure both hips are moving at the same time.     Copyright  VHI. All rights reserved.   Hip Flexor Stretch    Lying on back near edge of bed, bend one leg, foot flat. Hang other leg over edge, relaxed, thigh resting entirely on bed for _2-3___ minutes. Repeat __2-3__ times. Do _2-3___ sessions per day. Advanced Exercise: Bend knee back keeping thigh in contact with bed.  http://gt2.exer.us/346   Copyright  VHI. All rights reserved.

## 2016-08-23 NOTE — Therapy (Signed)
[facility] [address] [place], [place], [address] Phone: [phone]   Fax:  [phone]  Physical Therapy Treatment  Patient Details  Name: Brian Hall MRN: [id number] Date of Birth: 10-15-1955 Referring Provider: Alysia Penna, MD  Encounter Date: 08/23/2016      PT End of Session - 08/23/16 1157    Visit Number 5   Number of Visits 17   Date for PT Re-Evaluation 10/08/16   Authorization Type BC[employer] (30 PT/OT combined visit limit, 29 left for year)   PT Start Time 1147   PT Stop Time 1231   PT Time Calculation (min) 44 min   Activity Tolerance Patient tolerated treatment well   Behavior During Therapy [facility] for tasks assessed/performed      Past Medical History:  Diagnosis Date  . Blurred vision   . Cardiac LV ejection fraction 10-20%   . Cardiogenic shock ([place])   . CHF (congestive heart failure) ([place])   . Diabetes mellitus   . 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 Hyperkalemia   . Hyperlipemia   . Hypertension   . Hypothyroidism   . Joint pain   . Joint swelling   . Obesity   . Renal insufficiency   . SOB (shortness of breath)   . Stroke ([place]) 09/12/2014  . Throat cancer ([place]) 2011   s/p neck dissection, radiation, chemo    Past Surgical History:  Procedure Laterality Date  . CARDIAC CATHETERIZATION    . NECK DISSECTION Right 2011   s/p resection of throat cancer with multiple nodes removed    There were no vitals filed for this visit.      Subjective Assessment - 08/23/16 1154    Subjective Pt ambulatory into clinic today with less pain.    Patient is accompained by: Family member   Limitations Walking;House hold activities   Currently in Pain? Yes   Pain Score 4    Pain Location Back   Pain Orientation Left   Pain Descriptors / Indicators Aching   Pain Type Chronic pain   Pain Radiating Towards down the leg   Pain Onset More than a month ago   Pain Frequency  Intermittent   Aggravating Factors  WB walking   Pain Relieving Factors sitting, stretching                         [facility] Adult PT Treatment/Exercise - 08/23/16 0001      Ambulation/Gait   Ambulation/Gait Yes   Ambulation/Gait Assistance 4: Min guard;4: Min assist;3: Mod assist  for facilitaiton with and without AD   Ambulation/Gait Assistance Details Had pt ambulate following stretches in order to work on improved posture and L hip protraction.  Continue to note that L hip flexor remains tight, therefore performed this stretch and added to HEP.  Then ambulated without device in to further challenge pt and continue to note increased L hip tightness and decreased WB on LLE.     Ambulation Distance (Feet) 115 Feet  50' x 2   Assistive device Rolling walker  no AD, L AFO   Gait Pattern Step-to pattern;Step-through pattern;Decreased stride length;Decreased hip/knee flexion - left;Decreased dorsiflexion - left;Decreased weight shift to left;Lateral hip instability;Trunk rotated posteriorly on left;Trunk flexed   Ambulation Surface Level;Indoor     Neuro Re-ed    Neuro Re-ed Details  Gait without device, see gait details above.  Progressed to working on step ups with  RLE placed on first 6" step advancing RLE to  2nd and 3rd step.  Cues for decreased pulling with RUE with PT blocking L LE for safety but no buckling noted just fatigue following 10 reps.  BLE bridging to encourage L hip extension x 10 reps, added to HEP     Exercises   Exercises Other Exercises   Other Exercises  Performed seated L piriformis stretch x 2 mins prior to activity x 2 mins, supine over BOSU ball for anterior chest stretch x 3 mins with light over pressure from therapist with arms in close to "T" position.  Educated on how to perform in sitting position with wife assisting.  L hip flexor stretch x 2 min  x 2 reps                PT Education - 08/23/16 1156    Education provided Yes    Education Details additions to HEP for L hip flexibility and L hip strength.    Person(s) Educated Patient   Methods Explanation   Comprehension Verbalized understanding          PT Short Term Goals - 08/12/16 0920      PT SHORT TERM GOAL #1   Title Pt will perform HEP with assist from wife in order to improve strength, flexibility and balance.  (Target Date: 09/06/16)   Time 4   Period Weeks   Status New     PT SHORT TERM GOAL #2   Title Will assess TUG time in order to better understand fall risk and set LTG to reflect improvement for decreased fall risk.     Baseline 40.34 ft/sec with RW (PT holding L hand in place to reduce time) 08/12/16   Time 4   Period Weeks   Status New     PT SHORT TERM GOAL #3   Title Pt will verbalize compliance with RW use at all times in the home to improve functional endurance.     Time 4   Period Weeks   Status New     PT SHORT TERM GOAL #4   Title Pt will improve gait speed to 1.18 ft/sec in order to indicate decreased fall risk.     Time 4   Period Weeks   Status New           PT Long Term Goals - 08/09/16 1345      PT LONG TERM GOAL #1   Title Pt will be independent with HEP in order to indicate improved functional mobility.  (Target Date: 10/04/16)   Time 8   Period Weeks   Status New     PT LONG TERM GOAL #2   Title Pt will improve gait speed to 1.48 ft/sec in order to indicate decreased fall risk and improved efficiency of gait.     Time 8   Period Weeks   Status New     PT LONG TERM GOAL #3   Title Pt will report no more than 5/10 pain (at worst) in back and LLE during gait in order to indicate that pain is decreased limiting factor in mobility.     Time 8   Period Weeks   Status New     PT LONG TERM GOAL #4   Title Pt will perform up to 500' gait outdoors with LRAD at S level in order to indicate safe return to community.     Time 8   Period Weeks   Status New  PT LONG TERM GOAL #5   Title Pt will report  improved contact with friends/family including visiting them/meeting with them in community to promote improved mobility and mental health.     Time 8   Period Weeks   Status New               Plan - 08/23/16 1158    Clinical Impression Statement Skilled session continues to focus on anterior chest flexibility, core strengthening, and LLE flexibility and strengthening.  Pt reports less pain with mobility and note more compliance with HEP at home.  Made goal for only using w/c at night.    Rehab Potential Good   Clinical Impairments Affecting Rehab Potential chronicity of deficits, pt motivation   PT Frequency 2x / week   PT Duration 8 weeks   PT Treatment/Interventions ADLs/Self Care Home Management;Electrical Stimulation;Moist Heat;DME Instruction;Gait training;Stair training;Functional mobility training;Therapeutic activities;Therapeutic exercise;Balance training;Neuromuscular re-education;Patient/family education;Orthotic Fit/Training;Passive range of motion;Energy conservation   PT Next Visit Plan Check compliance with HEP, emphasize gait only in house, no W/c!! bed mobility with emphasis on log roll to protect back, core strength, gait quality and endurance.   Consulted and Agree with Plan of Care Patient;Family member/caregiver   Family Member Consulted wife sandra      Patient will benefit from skilled therapeutic intervention in order to improve the following deficits and impairments:  Abnormal gait, Decreased activity tolerance, Decreased balance, Decreased coordination, Decreased endurance, Decreased knowledge of precautions, Decreased mobility, Decreased range of motion, Decreased strength, Difficulty walking, Hypomobility, Impaired perceived functional ability, Impaired flexibility, Impaired sensation, Impaired UE functional use, Improper body mechanics, Postural dysfunction, Pain  Visit Diagnosis: 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 Gait disturbance, post-stroke 07/30/2016  . 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 Emotional lability 04/22/2016  . CKD (chronic kidney disease) 04/01/2016  . Spastic hemiplegia affecting nondominant side ([place]) 02/26/2016  . Hyperlipemia 01/17/2016  . BPH (benign prostatic hyperplasia) 10/16/2015  . 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 Spastic hemiparesis affecting nondominant side ([place]) 11/08/2014  . Hypothyroidism 10/27/2014  . Left-sided neglect 09/23/2014  . Left hemiparesis ([place])   . Right carotid artery occlusion   . Essential hypertension 09/12/2014  . Type 2 diabetes mellitus with peripheral neuropathy ([place]) 09/12/2014    Cameron Sprang, PT, MPT [facility] [address] [place], [place], [address] Phone: [phone]   Fax:  [phone] 08/23/16, 1:13 PM  Name: Brian Hall MRN: [id number] Date of Birth: June 04, 1956

## 2016-08-25 ENCOUNTER — Other Ambulatory Visit: Payer: Self-pay | Admitting: Physical Medicine & Rehabilitation

## 2016-08-25 ENCOUNTER — Other Ambulatory Visit: Payer: Self-pay | Admitting: Family Medicine

## 2016-08-29 ENCOUNTER — Ambulatory Visit: Payer: BLUE CROSS/BLUE SHIELD | Admitting: Rehabilitation

## 2016-08-29 ENCOUNTER — Encounter: Payer: Self-pay | Admitting: Rehabilitation

## 2016-08-29 DIAGNOSIS — M6281 Muscle weakness (generalized): Secondary | ICD-10-CM

## 2016-08-29 DIAGNOSIS — IMO0002 Reserved for concepts with insufficient information to code with codable children: Secondary | ICD-10-CM

## 2016-08-29 DIAGNOSIS — G8114 Spastic hemiplegia affecting left nondominant side: Secondary | ICD-10-CM | POA: Diagnosis not present

## 2016-08-29 DIAGNOSIS — R2681 Unsteadiness on feet: Secondary | ICD-10-CM

## 2016-08-29 DIAGNOSIS — R2689 Other abnormalities of gait and mobility: Secondary | ICD-10-CM

## 2016-08-29 NOTE — Therapy (Signed)
[facility] [address] [place], [place], [address] Phone: [phone]   Fax:  [phone]  Physical Therapy Treatment  Patient Details  Name: PHONG MEADS MRN: [id number] Date of Birth: [birth year] Referring Provider: Alysia Penna, MD  Encounter Date: 08/29/2016      PT End of Session - 08/29/16 1612    Visit Number 6   Number of Visits 17   Date for PT Re-Evaluation 10/08/16   Authorization Type [employer] (30 PT/OT combined visit limit, 29 left for year)   PT Start Time 1531   PT Stop Time 1615   PT Time Calculation (min) 44 min   Activity Tolerance Patient tolerated treatment well   Behavior During Therapy [facility] for tasks assessed/performed      Past Medical History:  Diagnosis Date  . Blurred vision   . Cardiac LV ejection fraction 10-20%   . Cardiogenic shock ([place])   . CHF (congestive heart failure) ([place])   . Diabetes mellitus   . 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 Hyperkalemia   . Hyperlipemia   . Hypertension   . Hypothyroidism   . Joint pain   . Joint swelling   . Obesity   . Renal insufficiency   . SOB (shortness of breath)   . Stroke ([place]) 09/12/2014  . Throat cancer ([place]) 2011   s/p neck dissection, radiation, chemo    Past Surgical History:  Procedure Laterality Date  . CARDIAC CATHETERIZATION    . NECK DISSECTION Right 2011   s/p resection of throat cancer with multiple nodes removed    There were no vitals filed for this visit.      Subjective Assessment - 08/29/16 1535    Subjective Continues to report less pain than baseline.    Patient is accompained by: Family member   Limitations Walking;House hold activities   Currently in Pain? Yes   Pain Score 4    Pain Location Back   Pain Orientation Left   Pain Descriptors / Indicators Aching   Pain Type Chronic pain   Pain Onset More than a month ago   Pain Frequency Intermittent   Aggravating Factors  WB  walking                         [facility] Adult PT Treatment/Exercise - 08/29/16 0001      Self-Care   Self-Care Other Self-Care Comments   Other Self-Care Comments  Had lengthy discussion with pt and wife regarding him getting out in community more, whether it be with friends/family or even just going out to each with wife.  Pt continues to be upset/depressed that people don't call him or come see him, however educated that he needs to get out and about and that often times showing people how he can move and get around, helps them understand and see that he can get out and go to events/places.  Pt given goal to go out to eat with wife in the next week and use RW.  Pt and wife verbalized understanding.       Neuro Re-ed    Neuro Re-ed Details  Continue to work on core strength with L SL with RUE and LE into abduction while PT provided perturbations with cues to maintain position, tall kneeling with elbows on physioball rolling ball out and back towards body for core activation, tall kneeling on blue balance discs without UE support moving from sitting on  heels to upright position with facilitation at chest and hips for upright posture x 10 reps     Exercises   Exercises Other Exercises   Other Exercises  Supine L piriformis stretch x 3 mins, L piriformis stretch with L LE crossed over R with PT elevating R LE off of mat x 2 min with cues for continued breathing.                  PT Education - 08/29/16 1639    Education provided Yes   Education Details See self care.   Person(s) Educated Patient   Methods Explanation   Comprehension Verbalized understanding          PT Short Term Goals - 08/12/16 0920      PT SHORT TERM GOAL #1   Title Pt will perform HEP with assist from wife in order to improve strength, flexibility and balance.  (Target Date: 09/06/16)   Time 4   Period Weeks   Status New     PT SHORT TERM GOAL #2   Title Will assess TUG time in order to  better understand fall risk and set LTG to reflect improvement for decreased fall risk.     Baseline 40.34 ft/sec with RW (PT holding L hand in place to reduce time) 08/12/16   Time 4   Period Weeks   Status New     PT SHORT TERM GOAL #3   Title Pt will verbalize compliance with RW use at all times in the home to improve functional endurance.     Time 4   Period Weeks   Status New     PT SHORT TERM GOAL #4   Title Pt will improve gait speed to 1.18 ft/sec in order to indicate decreased fall risk.     Time 4   Period Weeks   Status New           PT Long Term Goals - 08/09/16 1345      PT LONG TERM GOAL #1   Title Pt will be independent with HEP in order to indicate improved functional mobility.  (Target Date: 10/04/16)   Time 8   Period Weeks   Status New     PT LONG TERM GOAL #2   Title Pt will improve gait speed to 1.48 ft/sec in order to indicate decreased fall risk and improved efficiency of gait.     Time 8   Period Weeks   Status New     PT LONG TERM GOAL #3   Title Pt will report no more than 5/10 pain (at worst) in back and LLE during gait in order to indicate that pain is decreased limiting factor in mobility.     Time 8   Period Weeks   Status New     PT LONG TERM GOAL #4   Title Pt will perform up to 500' gait outdoors with LRAD at S level in order to indicate safe return to community.     Time 8   Period Weeks   Status New     PT LONG TERM GOAL #5   Title Pt will report improved contact with friends/family including visiting them/meeting with them in community to promote improved mobility and mental health.     Time 8   Period Weeks   Status New               Plan - 08/29/16 1622    Clinical Impression Statement Skilled  session continues to focus on LLE flexibility and core activation/strengthening.  Also had long discussion about pt getting back into community and embracing his "new normal" and continuing to live his life.  Pt continues to  excuse his not getting out on friends not calling or wanting to come see him.  Educated that many times people don't understand his mobility following CVA and that often times showing friends/family that you are independent with the walker and that you are getting out and about can ease their mind about how he gets around.  Pt verbalized understanding   Rehab Potential Good   Clinical Impairments Affecting Rehab Potential chronicity of deficits, pt motivation   PT Frequency 2x / week   PT Duration 8 weeks   PT Treatment/Interventions ADLs/Self Care Home Management;Electrical Stimulation;Moist Heat;DME Instruction;Gait training;Stair training;Functional mobility training;Therapeutic activities;Therapeutic exercise;Balance training;Neuromuscular re-education;Patient/family education;Orthotic Fit/Training;Passive range of motion;Energy conservation   PT Next Visit Plan Check compliance with HEP, emphasize gait only in house, no W/c!! bed mobility with emphasis on log roll to protect back, core strength, gait quality and endurance.   Consulted and Agree with Plan of Care Patient;Family member/caregiver   Family Member Consulted wife sandra      Patient will benefit from skilled therapeutic intervention in order to improve the following deficits and impairments:  Abnormal gait, Decreased activity tolerance, Decreased balance, Decreased coordination, Decreased endurance, Decreased knowledge of precautions, Decreased mobility, Decreased range of motion, Decreased strength, Difficulty walking, Hypomobility, Impaired perceived functional ability, Impaired flexibility, Impaired sensation, Impaired UE functional use, Improper body mechanics, Postural dysfunction, Pain  Visit Diagnosis: 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 Gait disturbance, post-stroke 07/30/2016  . 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 Emotional lability 04/22/2016  . CKD (chronic kidney disease) 04/01/2016  . Spastic hemiplegia affecting nondominant side ([place]) 02/26/2016  . Hyperlipemia 01/17/2016  . BPH (benign prostatic hyperplasia) 10/16/2015  . 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 Spastic hemiparesis affecting nondominant side ([place]) 11/08/2014  . Hypothyroidism 10/27/2014  . Left-sided neglect 09/23/2014  . Left hemiparesis ([place])   . Right carotid artery occlusion   . Essential hypertension 09/12/2014  . Type 2 diabetes mellitus with peripheral neuropathy ([place]) 09/12/2014   Cameron Sprang, PT, MPT [facility] [address] [place], [place], [address] Phone: [phone]   Fax:  [phone] 08/29/16, 4:59 PM  Name: ARCHIMEDES TASSINARI MRN: [id number] Date of Birth: 1956/06/12

## 2016-08-30 ENCOUNTER — Ambulatory Visit: Payer: BLUE CROSS/BLUE SHIELD | Admitting: Rehabilitation

## 2016-08-30 ENCOUNTER — Encounter: Payer: Self-pay | Admitting: Rehabilitation

## 2016-08-30 DIAGNOSIS — G8114 Spastic hemiplegia affecting left nondominant side: Secondary | ICD-10-CM | POA: Diagnosis not present

## 2016-08-30 DIAGNOSIS — R2689 Other abnormalities of gait and mobility: Secondary | ICD-10-CM

## 2016-08-30 DIAGNOSIS — R2681 Unsteadiness on feet: Secondary | ICD-10-CM

## 2016-08-30 DIAGNOSIS — M6281 Muscle weakness (generalized): Secondary | ICD-10-CM

## 2016-08-30 DIAGNOSIS — R293 Abnormal posture: Secondary | ICD-10-CM

## 2016-08-30 DIAGNOSIS — IMO0002 Reserved for concepts with insufficient information to code with codable children: Secondary | ICD-10-CM

## 2016-08-30 NOTE — Therapy (Signed)
[facility] [address] [place], [place], [address] Phone: [phone]   Fax:  [phone]  Physical Therapy Treatment  Patient Details  Name: Brian Hall MRN: [id number] Date of Birth: 1955-10-15 Referring Provider: Alysia Penna, MD  Encounter Date: 08/30/2016      PT End of Session - 08/30/16 1405    Visit Number 7   Number of Visits 17   Date for PT Re-Evaluation 10/08/16   Authorization Type [employer] (30 PT/OT combined visit limit, 29 left for year)   PT Start Time 1316   PT Stop Time 1402   PT Time Calculation (min) 46 min   Activity Tolerance Patient tolerated treatment well   Behavior During Therapy [facility] for tasks assessed/performed      Past Medical History:  Diagnosis Date  . Blurred vision   . Cardiac LV ejection fraction 10-20%   . Cardiogenic shock ([place])   . CHF (congestive heart failure) ([place])   . Diabetes mellitus   . 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 Hyperkalemia   . Hyperlipemia   . Hypertension   . Hypothyroidism   . Joint pain   . Joint swelling   . Obesity   . Renal insufficiency   . SOB (shortness of breath)   . Stroke ([place]) 09/12/2014  . Throat cancer ([place]) 2011   s/p neck dissection, radiation, chemo    Past Surgical History:  Procedure Laterality Date  . CARDIAC CATHETERIZATION    . NECK DISSECTION Right 2011   s/p resection of throat cancer with multiple nodes removed    There were no vitals filed for this visit.      Subjective Assessment - 08/30/16 1319    Subjective "I'm doing alright today."    Patient is accompained by: Family member   Limitations Walking;House hold activities   Currently in Pain? Yes   Pain Score 4    Pain Location Back   Pain Orientation Right   Pain Descriptors / Indicators Aching   Pain Type Chronic pain   Pain Radiating Towards down the leg   Pain Onset More than a month ago   Pain Frequency Intermittent   Aggravating Factors  WB walking   Pain Relieving Factors sitting, stretching                         [facility] Adult PT Treatment/Exercise - 08/30/16 0001      Self-Care   Self-Care Other Self-Care Comments   Other Self-Care Comments  continue to encourage pt to get out in community.  They are still thinking about going out to dinner today.       Neuro Re-ed    Neuro Re-ed Details  Core activation and proximal stability with tall kneeling and half kneeling.  While in tall kneeling had pt work on improved L hip extension to carryover to gait.   While in half kneeling worked on improved posture and core strength by moving weight ball 4.4 lbs up/down and side to side without support.  Performed x 2 sets of 10 reps.  Hooklying posterior pelvic tilt x 10 reps progressing to posterior tilt with alternating marching with relaxation in between x 10 reps.  Mini squats with target for visual cue and improved technique x 10 reps.  Also had pt work on transition from tall kneeling to R half kneeling to continue to improve L hip protraction and L proximal hip control.  Performed  x 10 reps with min to mod A to ensure upright posture.       Exercises   Exercises Other Exercises   Other Exercises  supine stretch over BOSU ball x 4 mins progressing to performing Thomas test stretch for hip flex while in pectoral stretch.  Modified abdominal crunches from BOSU ball x 10 reps with cues for keeping head upright instead of tucking chin to avoid neck strain.                  PT Education - 08/29/16 1639    Education provided Yes   Education Details See self care.   Person(s) Educated Patient   Methods Explanation   Comprehension Verbalized understanding          PT Short Term Goals - 08/12/16 0920      PT SHORT TERM GOAL #1   Title Pt will perform HEP with assist from wife in order to improve strength, flexibility and balance.  (Target Date: 09/06/16)   Time 4   Period Weeks   Status  New     PT SHORT TERM GOAL #2   Title Will assess TUG time in order to better understand fall risk and set LTG to reflect improvement for decreased fall risk.     Baseline 40.34 ft/sec with RW (PT holding L hand in place to reduce time) 08/12/16   Time 4   Period Weeks   Status New     PT SHORT TERM GOAL #3   Title Pt will verbalize compliance with RW use at all times in the home to improve functional endurance.     Time 4   Period Weeks   Status New     PT SHORT TERM GOAL #4   Title Pt will improve gait speed to 1.18 ft/sec in order to indicate decreased fall risk.     Time 4   Period Weeks   Status New           PT Long Term Goals - 08/09/16 1345      PT LONG TERM GOAL #1   Title Pt will be independent with HEP in order to indicate improved functional mobility.  (Target Date: 10/04/16)   Time 8   Period Weeks   Status New     PT LONG TERM GOAL #2   Title Pt will improve gait speed to 1.48 ft/sec in order to indicate decreased fall risk and improved efficiency of gait.     Time 8   Period Weeks   Status New     PT LONG TERM GOAL #3   Title Pt will report no more than 5/10 pain (at worst) in back and LLE during gait in order to indicate that pain is decreased limiting factor in mobility.     Time 8   Period Weeks   Status New     PT LONG TERM GOAL #4   Title Pt will perform up to 500' gait outdoors with LRAD at S level in order to indicate safe return to community.     Time 8   Period Weeks   Status New     PT LONG TERM GOAL #5   Title Pt will report improved contact with friends/family including visiting them/meeting with them in community to promote improved mobility and mental health.     Time 8   Period Weeks   Status New  Plan - 08/30/16 1405    Clinical Impression Statement Skilled session continues to focus on core strengthening as well as LLE NMR for improved L hip protraction and improved postural control to carry over to all  aspects of mobility.  PT recommend that he continue to do hip flexor stretch off edge of bed to increase flexibility.     Rehab Potential Good   Clinical Impairments Affecting Rehab Potential chronicity of deficits, pt motivation   PT Frequency 2x / week   PT Duration 8 weeks   PT Treatment/Interventions ADLs/Self Care Home Management;Electrical Stimulation;Moist Heat;DME Instruction;Gait training;Stair training;Functional mobility training;Therapeutic activities;Therapeutic exercise;Balance training;Neuromuscular re-education;Patient/family education;Orthotic Fit/Training;Passive range of motion;Energy conservation   PT Next Visit Plan Check compliance with HEP, emphasize gait only in house, no W/c!! bed mobility with emphasis on log roll to protect back, core strength, gait quality and endurance.   Consulted and Agree with Plan of Care Patient;Family member/caregiver   Family Member Consulted wife sandra      Patient will benefit from skilled therapeutic intervention in order to improve the following deficits and impairments:  Abnormal gait, Decreased activity tolerance, Decreased balance, Decreased coordination, Decreased endurance, Decreased knowledge of precautions, Decreased mobility, Decreased range of motion, Decreased strength, Difficulty walking, Hypomobility, Impaired perceived functional ability, Impaired flexibility, Impaired sensation, Impaired UE functional use, Improper body mechanics, Postural dysfunction, Pain  Visit Diagnosis: 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 Gait disturbance, post-stroke 07/30/2016  . 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 Emotional lability 04/22/2016  . CKD (chronic kidney disease)  04/01/2016  . Spastic hemiplegia affecting nondominant side ([place]) 02/26/2016  . Hyperlipemia 01/17/2016  . BPH (benign prostatic hyperplasia) 10/16/2015  . 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 Spastic hemiparesis affecting nondominant side ([place]) 11/08/2014  . Hypothyroidism 10/27/2014  . Left-sided neglect 09/23/2014  . Left hemiparesis ([place])   . Right carotid artery occlusion   . Essential hypertension 09/12/2014  . Type 2 diabetes mellitus with peripheral neuropathy ([place]) 09/12/2014    Cameron Sprang, PT, MPT [facility] [address] [place], [place], [address] Phone: [phone]   Fax:  [phone] 08/30/16, 2:22 PM  Name: Brian Hall MRN: [id number] Date of Birth: 05-26-1956

## 2016-09-05 ENCOUNTER — Encounter: Payer: Self-pay | Admitting: Rehabilitation

## 2016-09-05 ENCOUNTER — Ambulatory Visit: Payer: BLUE CROSS/BLUE SHIELD | Attending: Physical Medicine & Rehabilitation | Admitting: Rehabilitation

## 2016-09-05 ENCOUNTER — Telehealth: Payer: Self-pay | Admitting: Rehabilitation

## 2016-09-05 DIAGNOSIS — I639 Cerebral infarction, unspecified: Secondary | ICD-10-CM | POA: Insufficient documentation

## 2016-09-05 DIAGNOSIS — G8114 Spastic hemiplegia affecting left nondominant side: Secondary | ICD-10-CM | POA: Diagnosis not present

## 2016-09-05 DIAGNOSIS — R2689 Other abnormalities of gait and mobility: Secondary | ICD-10-CM | POA: Diagnosis present

## 2016-09-05 DIAGNOSIS — IMO0002 Reserved for concepts with insufficient information to code with codable children: Secondary | ICD-10-CM

## 2016-09-05 DIAGNOSIS — R2681 Unsteadiness on feet: Secondary | ICD-10-CM | POA: Diagnosis present

## 2016-09-05 DIAGNOSIS — R293 Abnormal posture: Secondary | ICD-10-CM | POA: Diagnosis present

## 2016-09-05 DIAGNOSIS — M6281 Muscle weakness (generalized): Secondary | ICD-10-CM | POA: Diagnosis present

## 2016-09-05 NOTE — Telephone Encounter (Signed)
Thanks Gwenlyn Saran is not taking new patients, yet we cannot schedule yet, but we'll keep that on the radar

## 2016-09-05 NOTE — Therapy (Signed)
[facility] [address] [place], [place], [address] Phone: [phone]   Fax:  [phone]  Physical Therapy Treatment  Patient Details  Name: Brian Hall MRN: [id number] Date of Birth: 1955/10/29 Referring Provider: Alysia Penna, MD  Encounter Date: 09/05/2016      PT End of Session - 09/05/16 1121    Visit Number 8   Number of Visits 17   Date for PT Re-Evaluation 10/08/16   Authorization Type [employer] (30 PT/OT combined visit limit, 29 left for year)   PT Start Time 1105   PT Stop Time 1152   PT Time Calculation (min) 47 min   Activity Tolerance Patient tolerated treatment well   Behavior During Therapy [facility] for tasks assessed/performed      Past Medical History:  Diagnosis Date  . Blurred vision   . Cardiac LV ejection fraction 10-20%   . Cardiogenic shock ([place])   . CHF (congestive heart failure) ([place])   . Diabetes mellitus   . 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 Hyperkalemia   . Hyperlipemia   . Hypertension   . Hypothyroidism   . Joint pain   . Joint swelling   . Obesity   . Renal insufficiency   . SOB (shortness of breath)   . Stroke ([place]) 09/12/2014  . Throat cancer ([place]) 2011   s/p neck dissection, radiation, chemo    Past Surgical History:  Procedure Laterality Date  . CARDIAC CATHETERIZATION    . NECK DISSECTION Right 2011   s/p resection of throat cancer with multiple nodes removed    There were no vitals filed for this visit.      Subjective Assessment - 09/05/16 1120    Subjective I'm doing okay today.    Patient is accompained by: Family member   Limitations Walking;House hold activities   Currently in Pain? Yes   Pain Score 4    Pain Location Back   Pain Orientation Right   Pain Descriptors / Indicators Aching   Pain Type Chronic pain   Pain Onset More than a month ago   Pain Frequency Intermittent   Aggravating Factors  WB walking   Pain Relieving  Factors sitting, stretching                         [facility] Adult PT Treatment/Exercise - 09/05/16 0001      Ambulation/Gait   Ambulation/Gait Yes   Ambulation/Gait Assistance 5: Supervision   Ambulation/Gait Assistance Details Had long discusion with pt regarding gait at home and what it would take to increase his mobility.  Asked pt whether or not he felt that he would increase walking if he could use LBQC.  He states "yes" that it seems to be "easier to move with the cane."  PT had pt ambulate around therapy gym and through tight spaces in order to better simulate home.  Pt able to do so at S level and therefore discussed that he could do this at home with S from wife but to still use RW if at home alone.     Ambulation Distance (Feet) 115 Feet   Assistive device Large base quad cane   Gait Pattern Step-to pattern;Step-through pattern;Decreased stride length;Decreased hip/knee flexion - left;Decreased dorsiflexion - left;Decreased weight shift to left;Lateral hip instability;Trunk rotated posteriorly on left;Trunk flexed   Ambulation Surface Level;Indoor     Self-Care   Self-Care Other Self-Care Comments   Other Self-Care  Comments  Had lengthy discussion with pt regarding therapy goals and the progress that pt has made.  Feel that he has made a lot of head way in reducing pain however he continues to use w/c at times in the house.  Feel that he is self limiting progress and that he needs to be only walking at home, no w/c.  Discussed using LBQC, see gait section for details.  Also discussed his anger and depression post CVA.  Pt initially refusing to speak with someone with wife and get coping skills, however continued to educate and explain things that neuropysch could help with and the impact that this could make on his life both from a mobility and mental standpoint.  Also discussed taking break from PT in order to have them work on these issues prior to returning to PT.  Finally  both verbalized understanding and pt agreed that he would be willing to see someone and that this PT could send note to MD for referral.                   PT Education - 09/05/16 1341    Education provided Yes   Education Details see self care   Person(s) Educated Patient;Spouse   Methods Explanation   Comprehension Verbalized understanding          PT Short Term Goals - 09/05/16 1121      PT SHORT TERM GOAL #1   Title Pt will perform HEP with assist from wife in order to improve strength, flexibility and balance.  (Target Date: 09/06/16)   Baseline met 09/05/16   Time 4   Period Weeks   Status Achieved     PT SHORT TERM GOAL #2   Title Will assess TUG time in order to better understand fall risk and set LTG to reflect improvement for decreased fall risk.     Baseline 40.34 ft/sec with RW (PT holding L hand in place to reduce time) 08/12/16   Time 4   Period Weeks   Status Unable to assess     PT SHORT TERM GOAL #3   Title Pt will verbalize compliance with RW use at all times in the home to improve functional endurance.     Baseline progressing towards using the RW at all times, still using at night due to urgency   Time 4   Period Weeks   Status Partially Met     PT SHORT TERM GOAL #4   Title Pt will improve gait speed to 1.18 ft/sec in order to indicate decreased fall risk.     Time 4   Period Weeks   Status Unable to assess           PT Long Term Goals - 08/09/16 1345      PT LONG TERM GOAL #1   Title Pt will be independent with HEP in order to indicate improved functional mobility.  (Target Date: 10/04/16)   Time 8   Period Weeks   Status New     PT LONG TERM GOAL #2   Title Pt will improve gait speed to 1.48 ft/sec in order to indicate decreased fall risk and improved efficiency of gait.     Time 8   Period Weeks   Status New     PT LONG TERM GOAL #3   Title Pt will report no more than 5/10 pain (at worst) in back and LLE during gait in order to  indicate that pain is  decreased limiting factor in mobility.     Time 8   Period Weeks   Status New     PT LONG TERM GOAL #4   Title Pt will perform up to 500' gait outdoors with LRAD at S level in order to indicate safe return to community.     Time 8   Period Weeks   Status New     PT LONG TERM GOAL #5   Title Pt will report improved contact with friends/family including visiting them/meeting with them in community to promote improved mobility and mental health.     Time 8   Period Weeks   Status New               Plan - 09/05/16 1342    Clinical Impression Statement Skilled session focused on brief assessment of STGs, but more so discussion increasing mobility at home and also seeking counseling to address pts anger and depression following CVA.  Pt okay with this PT notifying MD as he will see Dr. Letta Pate on Tuesday.  Note that pt is being placed on hold until seeking counseling.  Pt and wife verbalized understanding.    Rehab Potential Good   Clinical Impairments Affecting Rehab Potential chronicity of deficits, pt motivation   PT Frequency 2x / week   PT Duration 8 weeks   PT Treatment/Interventions ADLs/Self Care Home Management;Electrical Stimulation;Moist Heat;DME Instruction;Gait training;Stair training;Functional mobility training;Therapeutic activities;Therapeutic exercise;Balance training;Neuromuscular re-education;Patient/family education;Orthotic Fit/Training;Passive range of motion;Energy conservation   PT Next Visit Plan Check compliance with HEP, emphasize gait only in house, no W/c!! bed mobility with emphasis on log roll to protect back, core strength, gait quality and endurance.   Consulted and Agree with Plan of Care Patient;Family member/caregiver   Family Member Consulted wife sandra      Patient will benefit from skilled therapeutic intervention in order to improve the following deficits and impairments:  Abnormal gait, Decreased activity tolerance,  Decreased balance, Decreased coordination, Decreased endurance, Decreased knowledge of precautions, Decreased mobility, Decreased range of motion, Decreased strength, Difficulty walking, Hypomobility, Impaired perceived functional ability, Impaired flexibility, Impaired sensation, Impaired UE functional use, Improper body mechanics, Postural dysfunction, Pain  Visit Diagnosis: 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 Gait disturbance, post-stroke 07/30/2016  . 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 Emotional lability 04/22/2016  . CKD (chronic kidney disease) 04/01/2016  . Spastic hemiplegia affecting nondominant side ([place]) 02/26/2016  . Hyperlipemia 01/17/2016  . BPH (benign prostatic hyperplasia) 10/16/2015  . 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 Spastic hemiparesis affecting nondominant side ([place]) 11/08/2014  . Hypothyroidism 10/27/2014  . Left-sided neglect 09/23/2014  . Left hemiparesis ([place])   . Right carotid artery occlusion   . Essential hypertension 09/12/2014  . Type 2 diabetes mellitus with peripheral neuropathy ([place]) 09/12/2014    Cameron Sprang, PT, MPT [facility] [address] [place], [place], [address] Phone: [phone]   Fax:  [phone] 09/05/16, 1:45 PM  Name: KISHAWN PICKAR MRN: [id number] Date of Birth: May 20, 1956

## 2016-09-05 NOTE — Telephone Encounter (Signed)
Dr. Letta Pate,    I am seeing Marcella Dubs for OP PT.  As you know, we all have a long standing history with this patient.  I feel that we have made some head way in reducing his pain with stretching and core exercises and he is slowly but surely walking more at home.  However, I feel that a HUGE part of the issue is his depression and anger following CVA.  I think you and Leonie Man have mentioned getting counseling before, but he has declined.  I had a long discussion with he and his wife today and finally feel that I made a break through.  He was okay with me sending note to you as I think you see him Tuesday and getting a referral for counseling (I think the new neuropysch MD does not start until Jan 22nd).  Also, know that I told him we would keep him on hold until he gets this counseling as I feel that this is bigger issue than mobility at this time and would hopefully motivate him to take it seriously.    Thanks Dr. Raliegh Ip! Cameron Sprang, PT, MPT [facility] [address] [place], [place], [address] Phone: [phone]   Fax:  [phone] 09/05/16, 1:33 PM

## 2016-09-06 ENCOUNTER — Ambulatory Visit: Payer: BLUE CROSS/BLUE SHIELD | Admitting: Rehabilitation

## 2016-09-09 ENCOUNTER — Ambulatory Visit: Payer: BLUE CROSS/BLUE SHIELD | Admitting: Rehabilitation

## 2016-09-10 ENCOUNTER — Encounter: Payer: Self-pay | Admitting: Physical Medicine & Rehabilitation

## 2016-09-10 ENCOUNTER — Ambulatory Visit (HOSPITAL_BASED_OUTPATIENT_CLINIC_OR_DEPARTMENT_OTHER): Payer: BLUE CROSS/BLUE SHIELD | Admitting: Physical Medicine & Rehabilitation

## 2016-09-10 ENCOUNTER — Encounter: Payer: BLUE CROSS/BLUE SHIELD | Attending: Physical Medicine & Rehabilitation

## 2016-09-10 VITALS — BP 134/70 | HR 61

## 2016-09-10 DIAGNOSIS — I63511 Cerebral infarction due to unspecified occlusion or stenosis of right middle cerebral artery: Secondary | ICD-10-CM | POA: Insufficient documentation

## 2016-09-10 DIAGNOSIS — I69398 Other sequelae of cerebral infarction: Secondary | ICD-10-CM

## 2016-09-10 DIAGNOSIS — F4321 Adjustment disorder with depressed mood: Secondary | ICD-10-CM | POA: Diagnosis not present

## 2016-09-10 DIAGNOSIS — G8110 Spastic hemiplegia affecting unspecified side: Secondary | ICD-10-CM

## 2016-09-10 DIAGNOSIS — R269 Unspecified abnormalities of gait and mobility: Secondary | ICD-10-CM

## 2016-09-10 NOTE — Patient Instructions (Signed)
You should get a call from this office in regards to the neuropsychology evaluation with Dr. Sima Matas

## 2016-09-10 NOTE — Progress Notes (Signed)
Subjective:    Patient ID: Brian Hall, male    DOB: November 11, 1955, 61 y.o.   MRN: [id number]  HPI Status post Botox injection 07/30/2016 Left nondominant arm. Injected. Biceps 75 Brachialis 50 Brachioradialis 25 FCR 25 FDS 50 FDP 50. FPL 25  Patient has had good relaxation after procedure. Still has adequate grip to use the left upper extremity as a gross assist, such as opening of bottles. Patient states he's been a little down since his neurologist told him he has plateaued in his function. Received a message from PT recommending patient sees a psychologist. Pain Inventory Average Pain 6 Pain Right Now 6 My pain is intermittent and aching  In the last 24 hours, has pain interfered with the following? General activity 4 Relation with others 4 Enjoyment of life 6 What TIME of day is your pain at its worst? daytime Sleep (in general) Fair  Pain is worse with: some activites Pain improves with: therapy/exercise and medication Relief from Meds: 6  Mobility walk with assistance use a walker ability to climb steps?  yes do you drive?  no use a wheelchair transfers alone  Function disabled: date disabled . I need assistance with the following:  dressing, meal prep, household duties and shopping  Neuro/Psych trouble walking depression  Prior Studies Any changes since last visit?  no  Physicians involved in your care Any changes since last visit?  no   Family History  Problem Relation Age of Onset  . Hypertension Mother   . Hypertension Father   . Stroke Father   . Stroke Sister   . Arthritis/Rheumatoid Brother    Social History   Social History  . Marital status: Married    Spouse name: N/A  . Number of children: 1  . Years of education: 23   Occupational History  . truck driver, retired    Social History Main Topics  . Smoking status: Never Smoker  . Smokeless tobacco: Never Used  . Alcohol use No  . Drug use: No  . Sexual activity: Not  on file   Other Topics Concern  . Not on file   Social History Narrative   Married, 1 daughter   Right handed   Caffeine use - rare tea      Past Surgical History:  Procedure Laterality Date  . CARDIAC CATHETERIZATION    . NECK DISSECTION Right 2011   s/p resection of throat cancer with multiple nodes removed   Past Medical History:  Diagnosis Date  . Blurred vision   . Cardiac LV ejection fraction 10-20%   . Cardiogenic shock ([place])   . CHF (congestive heart failure) ([place])   . Diabetes mellitus   . 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 Hyperkalemia   . Hyperlipemia   . Hypertension   . Hypothyroidism   . Joint pain   . Joint swelling   . Obesity   . Renal insufficiency   . SOB (shortness of breath)   . Stroke ([place]) 09/12/2014  . Throat cancer ([place]) 2011   s/p neck dissection, radiation, chemo   There were no vitals taken for this visit.  Opioid Risk Score:   Fall Risk Score:  `1  Depression screen PHQ 2/9  Depression screen [facility] 2/9 07/29/2016 04/22/2016 04/01/2016 02/06/2016 01/17/2016 10/16/2015 07/14/2015  Decreased Interest 2 2 0 0 1 1 1   Down, Depressed, Hopeless 1 3 0 0 0 1 1  PHQ - 2 Score 3 5 0 0 1  2 2  Altered sleeping 1 1 - - - 0 0  Tired, decreased energy 1 1 - - - 0 0  Change in appetite 1 1 - - - 0 0  Feeling bad or failure about yourself  1 0 - - - 1 0  Trouble concentrating 0 0 - - - 0 0  Moving slowly or fidgety/restless 0 0 - - - 0 0  Suicidal thoughts 1 1 - - - 0 0  PHQ-9 Score 8 9 - - - 3 2  Difficult doing work/chores - Somewhat difficult - - - Not difficult at all -   Review of Systems  Constitutional: Positive for unexpected weight change.  HENT: Negative.   Eyes: Negative.   Respiratory: Negative.   Cardiovascular: Negative.   Gastrointestinal: Negative.   Endocrine: Negative.   Genitourinary: Negative.   Musculoskeletal: Positive for gait problem and joint swelling.  Skin: Negative.   Allergic/Immunologic: Negative.     Hematological: Negative.   Psychiatric/Behavioral: Positive for dysphoric mood.  All other systems reviewed and are negative.      Objective:   Physical Exam  Constitutional: He is oriented to person, place, and time. He appears well-developed and well-nourished.  HENT:  Head: Normocephalic and atraumatic.  Eyes: Conjunctivae and EOM are normal. Pupils are equal, round, and reactive to light.  Neck: Normal range of motion.  Musculoskeletal:  Negative impingement sign left shoulder  Neurological: He is alert and oriented to person, place, and time. Coordination and gait abnormal.  3 minus at the left deltoid, biceps, triceps, grip 4 minus, left hip flexor, knee extensor, 2 minus of the left ankle dorsiflexor  Sensation is absent to light touch in the left upper and left lower limb  Patient is able to oppose thumb to index and left hand but not to other fingers  Tone [place] 2 at the finger and wrist flexors on the left side, 1-2 at the left elbow flexor  Skin: Skin is warm and dry.  Nursing note and vitals reviewed.         Assessment & Plan:  1. 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 Has plateaued in function. Spasticity is an ongoing issue. Good relief with Botox. Continue current dosing. Will be able to start with every three-month visits for this. 2. Adjustment disorder with depressed mood. Referral to neuropsychology.  3. Gait disorder related to above, primarily disabled from work. Insurance papers completed.

## 2016-09-13 ENCOUNTER — Ambulatory Visit: Payer: BLUE CROSS/BLUE SHIELD | Admitting: Rehabilitation

## 2016-09-16 ENCOUNTER — Ambulatory Visit: Payer: BLUE CROSS/BLUE SHIELD | Admitting: Rehabilitation

## 2016-09-20 ENCOUNTER — Ambulatory Visit: Payer: BLUE CROSS/BLUE SHIELD | Admitting: Rehabilitation

## 2016-09-23 ENCOUNTER — Ambulatory Visit: Payer: BLUE CROSS/BLUE SHIELD | Admitting: Rehabilitation

## 2016-09-27 ENCOUNTER — Ambulatory Visit: Payer: BLUE CROSS/BLUE SHIELD | Admitting: Rehabilitation

## 2016-09-30 ENCOUNTER — Ambulatory Visit: Payer: BLUE CROSS/BLUE SHIELD | Admitting: Rehabilitation

## 2016-10-04 ENCOUNTER — Ambulatory Visit: Payer: BLUE CROSS/BLUE SHIELD | Admitting: Rehabilitation

## 2016-10-11 ENCOUNTER — Other Ambulatory Visit: Payer: Self-pay | Admitting: Family Medicine

## 2016-10-17 ENCOUNTER — Other Ambulatory Visit: Payer: Self-pay | Admitting: Family Medicine

## 2016-10-22 ENCOUNTER — Ambulatory Visit: Payer: BLUE CROSS/BLUE SHIELD | Admitting: Physical Medicine & Rehabilitation

## 2016-10-25 ENCOUNTER — Other Ambulatory Visit: Payer: Self-pay | Admitting: Family Medicine

## 2016-11-04 ENCOUNTER — Ambulatory Visit (HOSPITAL_BASED_OUTPATIENT_CLINIC_OR_DEPARTMENT_OTHER): Payer: BLUE CROSS/BLUE SHIELD | Admitting: Physical Medicine & Rehabilitation

## 2016-11-04 ENCOUNTER — Encounter: Payer: BLUE CROSS/BLUE SHIELD | Attending: Physical Medicine & Rehabilitation

## 2016-11-04 ENCOUNTER — Encounter: Payer: Self-pay | Admitting: Physical Medicine & Rehabilitation

## 2016-11-04 VITALS — BP 113/71 | HR 66 | Resp 14

## 2016-11-04 DIAGNOSIS — G8110 Spastic hemiplegia affecting unspecified side: Secondary | ICD-10-CM | POA: Diagnosis not present

## 2016-11-04 DIAGNOSIS — I63511 Cerebral infarction due to unspecified occlusion or stenosis of right middle cerebral artery: Secondary | ICD-10-CM | POA: Insufficient documentation

## 2016-11-04 NOTE — Patient Instructions (Signed)

## 2016-11-04 NOTE — Progress Notes (Signed)
Botox Injection for spasticity using needle EMG guidance  Dilution: 50 Units/ml Indication: Severe spasticity which interferes with ADL,mobility and/or  hygiene and is unresponsive to medication management and other conservative care Informed consent was obtained after describing risks and benefits of the procedure with the patient. This includes bleeding, bruising, infection, excessive weakness, or medication side effects. A REMS form is on file and signed. Needle: 8mm 25 g needle electrode Number of units per muscle Biceps 75 Brachialis 50 Brachioradialis 25 FCR 25 FDS 50 FDP 50. FPL 25 All injections were done after obtaining appropriate EMG activity and after negative drawback for blood. The patient tolerated the procedure well. Post procedure instructions were given. A followup appointment was made.

## 2016-11-11 ENCOUNTER — Other Ambulatory Visit: Payer: Self-pay | Admitting: Family Medicine

## 2016-11-11 ENCOUNTER — Other Ambulatory Visit: Payer: Self-pay | Admitting: Physical Medicine & Rehabilitation

## 2016-11-13 ENCOUNTER — Other Ambulatory Visit: Payer: Self-pay | Admitting: Family Medicine

## 2016-11-22 ENCOUNTER — Other Ambulatory Visit: Payer: Self-pay | Admitting: Physical Medicine & Rehabilitation

## 2016-11-22 NOTE — Telephone Encounter (Signed)
Recieved electronic refill request for tizanidine, no mention in last notes to continue this medication, please advise

## 2016-11-25 ENCOUNTER — Encounter (HOSPITAL_BASED_OUTPATIENT_CLINIC_OR_DEPARTMENT_OTHER): Payer: BLUE CROSS/BLUE SHIELD | Admitting: Psychology

## 2016-11-25 ENCOUNTER — Encounter: Payer: Self-pay | Admitting: Psychology

## 2016-11-25 DIAGNOSIS — I63511 Cerebral infarction due to unspecified occlusion or stenosis of right middle cerebral artery: Secondary | ICD-10-CM | POA: Diagnosis not present

## 2016-11-25 DIAGNOSIS — G8110 Spastic hemiplegia affecting unspecified side: Secondary | ICD-10-CM

## 2016-11-25 DIAGNOSIS — F4321 Adjustment disorder with depressed mood: Secondary | ICD-10-CM

## 2016-11-25 NOTE — Progress Notes (Signed)
Patient:   Brian Hall   DOB:   1956/03/13  MR Number:  [id number]  Location:  [facility] [address] [place] [id number] [place] [address] Dept: [phone]           Date of Service:   11/25/2016  Start Time:   2 PM End Time:   3 PM  Provider/Observer:  Edgardo Roys PSYD       Billing Code/Service: [phone]  Chief Complaint:     Chief Complaint  Patient presents with  . Agitation  . Depression  . Stress    Reason for Service:  The patient is a 61 year old Caucasian male that presented to the emergency department in September 12, 2014 with left-sided weakness and slurred speech. MRI of the brain showed a large acute tertiary right MCA tertiary infarct involving the right frontal lobe as well as remote infarcts of the left medial occipital lobe and left cerebellar hemisphere.  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 The patient was treated in the inpatient rehabilitation unit and was discharged with follow-up care with physical medicine and Dr. Letta Pate.  The patient has continued to have some significant adjustment issues and difficulties. Psychosocial stressors related to the relationship between the patient is wife as well as his stepson created complicated situation for him to cope with along with his physical limitations of left side hemiparesis. The patient was referred for neuropsychological interventions and we'll initially work on building coping skills around the adjustment issues.  Current Status:  The patient describes a lot of stress and frustration around his lack of adaptation.  Reliability of Information: Information is provided by the patient as well as review of  Behavioral Observation: MAEJOR ERVEN  presents as a 61 y.o.-year-old Right Caucasian Male who appeared his stated age. his dress was Appropriate and he was Well  Groomed and his manners were Appropriate to the situation.  his participation was indicative of Appropriate, Attentive and Resistant behaviors.  There were physical disabilities noted.  he displayed an appropriate level of cooperation and motivation.     Interactions:    Active Attentive and Resistant  Attention:   The patient did appear to be distracted by internal worry and stressors. and attention span appeared shorter than expected for age  Memory:   abnormal; the patient's overall memory is generally intact. The patient did appear to be distracted by certain internal thoughts.  Visuo-spatial:  within normal limits  Speech (Volume):  normal  Speech:   normal; normal  Thought Process:  Coherent and Tangential  Though Content:  Rumination; obsessions  Worry about limitations  Orientation:   person, place, time/date and situation  Judgment:   Fair  Planning:   Poor  Affect:    Anxious, Depressed and Irritable  Mood:    Anxious and Irritable  Insight:   Fair  Intelligence:   normal   Medical History:   Past Medical History:  Diagnosis Date  . Blurred vision   . Cardiac LV ejection fraction 10-20%   . Cardiogenic shock ([place])   . CHF (congestive heart failure) ([place])   . Diabetes mellitus   . 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 Hyperkalemia   . Hyperlipemia   . Hypertension   . Hypothyroidism   . Joint pain   . Joint swelling   . Obesity   . Renal insufficiency   .  SOB (shortness of breath)   . Stroke ([place]) 09/12/2014  . Throat cancer [facility]) 2011   s/p neck dissection, radiation, chemo        Outpatient Encounter Prescriptions as of 11/25/2016  Medication Sig  . amLODipine (NORVASC) 5 MG tablet take 1 tablet by mouth once daily for BLOOD PRESSURE  . carvedilol (COREG) 25 MG tablet take 1 tablet by mouth twice a day  . cholecalciferol (VITAMIN D) 400 UNITS TABS tablet Take 400 Units by mouth daily.  . clopidogrel (PLAVIX) 75 MG tablet take 1 tablet  by mouth daily  . gabapentin (NEURONTIN) 300 MG capsule take 1 capsule by mouth three times a day  . glimepiride (AMARYL) 2 MG tablet take 1 tablet by mouth once daily with BREAKFAST  . levothyroxine (SYNTHROID, LEVOTHROID) 88 MCG tablet take 1 tablet by mouth once daily before BREAKFAST  . lisinopril (PRINIVIL,ZESTRIL) 40 MG tablet take 1 tablet by mouth daily  . Multiple Vitamins-Minerals (MULTIVITAMIN WITH MINERALS) tablet Take 1 tablet by mouth daily.  . Naproxen Sodium (ALEVE) 220 MG CAPS Take 2 capsules by mouth 2 (two) times daily.  Marland Kitchen omeprazole (PRILOSEC) 40 MG capsule take 1 capsule by mouth once daily  . ONGLYZA 5 MG TABS tablet take 1 tablet by mouth daily  . pravastatin (PRAVACHOL) 40 MG tablet take 1 tablet by mouth daily  . tiZANidine (ZANAFLEX) 2 MG tablet take 1 tablet by mouth three times a day  . traMADol (ULTRAM) 50 MG tablet take 1 tablet by mouth every 6 hours if needed for MODERATE PAIN   No facility-administered encounter medications on file as of 11/25/2016.           Sexual History:   History  Sexual Activity  . Sexual activity: Not on file    Abuse/Trauma History: Patient had MCA infarction with severe residual deficits primarily due to left side motor control.  He has not adjusted well to this loss of functions.  He implied to have had a very hard driving demanding father, who likely put a lot of stress on him as a young man.  THere may be more to this.  Psychiatric History:    Family Med/Psych History:  Family History  Problem Relation Age of Onset  . Hypertension Mother   . Hypertension Father   . Stroke Father   . Stroke Sister   . Arthritis/Rheumatoid Brother     Risk of Suicide/Violence: moderate The patient denies current SI or HI but did state vague remarks that he may be better if he just died.  Denied plan or impulse to actively hurt self.  Impression/DX:  The patient is status post MCA infarct of Right Hemisphere with near total loss of  function in right arm and leg.  Patient denies significant change in cognitive function, but he was quite labile in his emotion that may or may not have an organic factor to it.  Will keep assessing that.  Disposition/Plan:  Will initially work on the adjustment issues and symptoms of depression.  Diagnosis:    Spastic hemiparesis affecting nondominant side (HCC)  Adjustment disorder with depressed mood         Electronically Signed   _______________________ Ilean Skill, Psy.D.

## 2016-12-04 ENCOUNTER — Other Ambulatory Visit: Payer: Self-pay | Admitting: *Deleted

## 2016-12-04 MED ORDER — TIZANIDINE HCL 2 MG PO TABS: 2.0000 mg | ORAL_TABLET | Freq: Three times a day (TID) | ORAL | 2 refills | Status: DC

## 2016-12-06 ENCOUNTER — Telehealth: Payer: Self-pay | Admitting: Family Medicine

## 2016-12-06 ENCOUNTER — Ambulatory Visit (INDEPENDENT_AMBULATORY_CARE_PROVIDER_SITE_OTHER): Payer: BLUE CROSS/BLUE SHIELD | Admitting: Family Medicine

## 2016-12-06 ENCOUNTER — Encounter: Payer: Self-pay | Admitting: Family Medicine

## 2016-12-06 VITALS — BP 101/60 | HR 53 | Temp 97.3°F | Ht 71.0 in | Wt 265.0 lb

## 2016-12-06 DIAGNOSIS — E1142 Type 2 diabetes mellitus with diabetic polyneuropathy: Secondary | ICD-10-CM

## 2016-12-06 DIAGNOSIS — N401 Enlarged prostate with lower urinary tract symptoms: Secondary | ICD-10-CM | POA: Diagnosis not present

## 2016-12-06 DIAGNOSIS — E782 Mixed hyperlipidemia: Secondary | ICD-10-CM

## 2016-12-06 DIAGNOSIS — I10 Essential (primary) hypertension: Secondary | ICD-10-CM | POA: Diagnosis not present

## 2016-12-06 DIAGNOSIS — R269 Unspecified abnormalities of gait and mobility: Secondary | ICD-10-CM | POA: Diagnosis not present

## 2016-12-06 DIAGNOSIS — R350 Frequency of micturition: Secondary | ICD-10-CM

## 2016-12-06 DIAGNOSIS — I69398 Other sequelae of cerebral infarction: Secondary | ICD-10-CM | POA: Diagnosis not present

## 2016-12-06 DIAGNOSIS — G8110 Spastic hemiplegia affecting unspecified side: Secondary | ICD-10-CM | POA: Diagnosis not present

## 2016-12-06 DIAGNOSIS — N189 Chronic kidney disease, unspecified: Secondary | ICD-10-CM | POA: Diagnosis not present

## 2016-12-06 DIAGNOSIS — R4586 Emotional lability: Secondary | ICD-10-CM

## 2016-12-06 DIAGNOSIS — E038 Other specified hypothyroidism: Secondary | ICD-10-CM

## 2016-12-06 LAB — BAYER DCA HB A1C WAIVED: HB A1C (BAYER DCA - WAIVED): 11.5 % — ABNORMAL HIGH (ref <7.0)

## 2016-12-06 MED ORDER — ARIPIPRAZOLE 5 MG PO TABS
5.0000 mg | ORAL_TABLET | Freq: Every day | ORAL | 1 refills | Status: DC
Start: 2016-12-06 — End: 2016-12-20

## 2016-12-06 MED ORDER — GLIMEPIRIDE 4 MG PO TABS: ORAL_TABLET | ORAL | 5 refills | Status: DC

## 2016-12-06 MED ORDER — METFORMIN HCL ER (MOD) 1000 MG PO TB24: 1000.0000 mg | ORAL_TABLET | Freq: Every day | ORAL | 5 refills | Status: DC

## 2016-12-06 NOTE — Telephone Encounter (Signed)
Pharmacy aware

## 2016-12-06 NOTE — Telephone Encounter (Signed)
Yes

## 2016-12-06 NOTE — Progress Notes (Signed)
Subjective:  Patient ID: Brian Hall, male    DOB: 1956/08/04  Age: 61 y.o. MRN: [id number]  CC: Diabetes (pt here today for routine follow up on his chronic medical conditions.)   HPI Brian Hall presents for  follow-up of hypertension. Patient has no history of headache chest pain or shortness of breath or recent cough. Patient also denies symptoms of TIA such as numbness weakness lateralizing. Patient checks  blood pressure at home. Recent readings have been good Patient denies side effects from medication. States taking it regularly.  Patient also  in for follow-up of elevated cholesterol. Doing well without complaints on current medication. Denies side effects of statin including myalgia and arthralgia and nausea. Also in today for liver function testing. Currently no chest pain, shortness of breath or other cardiovascular related symptoms noted.  Follow-up of diabetes. Patient does check blood sugar at home. Readings run between 20 and 300Wife states he is drinking nearly a gallon of milk daily. Not on diet.  Patient denies symptoms such as polyuria, polydipsia, excessive hunger, nausea No significant hypoglycemic spells noted. Medications reviewed. Pt reports taking them regularly. Pt. denies complication/adverse reaction today.    History Brian Hall has a past medical history of Blurred vision; Cardiac LV ejection fraction 10-20%; Cardiogenic shock ([place]); CHF (congestive heart failure) ([place]); Diabetes mellitus; History of ischemic right MCA stroke (05/26/2015); Hyperkalemia; Hyperlipemia; Hypertension; Hypothyroidism; Joint pain; Joint swelling; Obesity; Renal insufficiency; SOB (shortness of breath); Stroke [facility]) (09/12/2014); and Throat cancer ([place]) (2011).   He has a past surgical history that includes Cardiac catheterization and Neck dissection (Right, 2011).   His family history includes Arthritis/Rheumatoid in his brother; Hypertension in his father and mother; Stroke in his  father and sister.He reports that he has never smoked. He has never used smokeless tobacco. He reports that he does not drink alcohol or use drugs.  Current Outpatient Prescriptions on File Prior to Visit  Medication Sig Dispense Refill  . amLODipine (NORVASC) 5 MG tablet take 1 tablet by mouth once daily for BLOOD PRESSURE 30 tablet 3  . carvedilol (COREG) 25 MG tablet take 1 tablet by mouth twice a day 60 tablet 3  . cholecalciferol (VITAMIN D) 400 UNITS TABS tablet Take 400 Units by mouth daily.    . clopidogrel (PLAVIX) 75 MG tablet take 1 tablet by mouth daily 30 tablet 1  . gabapentin (NEURONTIN) 300 MG capsule take 1 capsule by mouth three times a day 90 capsule 3  . lisinopril (PRINIVIL,ZESTRIL) 40 MG tablet take 1 tablet by mouth daily 30 tablet 5  . Multiple Vitamins-Minerals (MULTIVITAMIN WITH MINERALS) tablet Take 1 tablet by mouth daily.    . Naproxen Sodium (ALEVE) 220 MG CAPS Take 2 capsules by mouth 2 (two) times daily.    Marland Kitchen omeprazole (PRILOSEC) 40 MG capsule take 1 capsule by mouth once daily 30 capsule 1  . ONGLYZA 5 MG TABS tablet take 1 tablet by mouth daily 30 tablet 2  . tiZANidine (ZANAFLEX) 2 MG tablet Take 1 tablet (2 mg total) by mouth 3 (three) times daily. 90 tablet 2  . traMADol (ULTRAM) 50 MG tablet take 1 tablet by mouth every 6 hours if needed for MODERATE PAIN 60 tablet 3   No current facility-administered medications on file prior to visit.     ROS Review of Systems  Constitutional: Negative for chills, diaphoresis and fever.  HENT: Negative for rhinorrhea and sore throat.   Respiratory: Negative for cough and shortness of breath.  Cardiovascular: Negative for chest pain.  Gastrointestinal: Negative for abdominal pain.  Musculoskeletal: Positive for gait problem (Wife says he is able to ambullate slowly with a walker at home. Encouraged by P.T. but pt denies. Will not walk. ). Negative for arthralgias and myalgias.  Skin: Negative for rash.    Neurological: Positive for weakness (left hemiparesis). Negative for headaches.  Psychiatric/Behavioral: Positive for agitation and behavioral problems (lashing ou at wife. She is afraid.).    Objective:  BP 101/60   Pulse (!) 53   Temp 97.3 F (36.3 C) (Oral)   Ht 5' 11"  (1.803 m)   Wt 265 lb (120.2 kg)   BMI 36.96 kg/m   BP Readings from Last 3 Encounters:  12/06/16 101/60  11/04/16 113/71  09/10/16 134/70    Wt Readings from Last 3 Encounters:  12/06/16 265 lb (120.2 kg)  07/29/16 264 lb (119.7 kg)  07/22/16 264 lb (119.7 kg)     Physical Exam  Constitutional: He is oriented to person, place, and time. He appears well-developed and well-nourished. No distress.  HENT:  Head: Normocephalic and atraumatic.  Right Ear: External ear normal.  Left Ear: External ear normal.  Nose: Nose normal.  Mouth/Throat: Oropharynx is clear and moist.  Eyes: Conjunctivae and EOM are normal. Pupils are equal, round, and reactive to light.  Neck: Normal range of motion. Neck supple. No thyromegaly present.  Cardiovascular: Normal rate, regular rhythm and normal heart sounds.   No murmur heard. Pulmonary/Chest: Effort normal and breath sounds normal. No respiratory distress. He has no wheezes. He has no rales.  Abdominal: Soft. Bowel sounds are normal. He exhibits no distension. There is no tenderness.  Lymphadenopathy:    He has no cervical adenopathy.  Neurological: He is alert and oriented to person, place, and time. He has normal reflexes. He exhibits abnormal muscle tone (spastic with paresis on left). Coordination abnormal.  Skin: Skin is warm and dry.  Psychiatric: His mood appears anxious. His affect is angry. His speech is rapid and/or pressured. He is agitated and aggressive. Thought content is paranoid. Cognition and memory are normal. He expresses impulsivity and inappropriate judgment.  P. Yelling at wife inappropriately as she relates her concerns about his diabetes,  including dietary noncompliance issues. Also his failure to perform basic activities that he is able to do. Additionally, he expresses anger that his stepson is living with them even though he works and provides for himself.    No components found for: BAYER DCA HB A1C WAIVED    Assessment & Plan:   Brian Hall was seen today for diabetes.  Diagnoses and all orders for this visit:  Essential hypertension -     CMP14+EGFR  Other specified hypothyroidism -     CMP14+EGFR -     TSH + free T4  Type 2 diabetes mellitus with peripheral neuropathy (HCC) -     Cancel: Microalbumin / creatinine urine ratio -     Cancel: Urinalysis -     Bayer DCA Hb A1c Waived -     Lipid panel -     CMP14+EGFR -     TSH + free T4 -     CBC with Differential/Platelet  Mixed hyperlipidemia -     Lipid panel -     CMP14+EGFR  Spastic hemiplegia affecting nondominant side (HCC) -     CMP14+EGFR  Chronic kidney disease, unspecified CKD stage -     CMP14+EGFR  Benign prostatic hyperplasia with urinary frequency -  CMP14+EGFR  Emotional lability  Gait disturbance, post-stroke  Other orders -     ARIPiprazole (ABILIFY) 5 MG tablet; Take 1 tablet (5 mg total) by mouth daily. -     metFORMIN (GLUMETZA) 1000 MG (MOD) 24 hr tablet; Take 1 tablet (1,000 mg total) by mouth daily with breakfast. -     glimepiride (AMARYL) 4 MG tablet; take 1 tablet by mouth once daily with BREAKFAST   I have changed Mr. Niziolek's glimepiride. I am also having him start on ARIPiprazole and metFORMIN. Additionally, I am having him maintain his multivitamin with minerals, cholecalciferol, Naproxen Sodium, lisinopril, traMADol, amLODipine, carvedilol, clopidogrel, omeprazole, gabapentin, ONGLYZA, and tiZANidine.  Meds ordered this encounter  Medications  . ARIPiprazole (ABILIFY) 5 MG tablet    Sig: Take 1 tablet (5 mg total) by mouth daily.    Dispense:  30 tablet    Refill:  1  . metFORMIN (GLUMETZA) 1000 MG (MOD) 24  hr tablet    Sig: Take 1 tablet (1,000 mg total) by mouth daily with breakfast.    Dispense:  30 tablet    Refill:  5  . glimepiride (AMARYL) 4 MG tablet    Sig: take 1 tablet by mouth once daily with BREAKFAST    Dispense:  30 tablet    Refill:  5     Follow-up: Return in about 1 month (around 01/05/2017).  Claretta Fraise, M.D.

## 2016-12-07 LAB — CBC WITH DIFFERENTIAL/PLATELET
BASOS ABS: 0 10*3/uL (ref 0.0–0.2)
BASOS: 0 %
EOS (ABSOLUTE): 0.3 10*3/uL (ref 0.0–0.4)
Eos: 3 %
Hematocrit: 41.5 % (ref 37.5–51.0)
Hemoglobin: 13.8 g/dL (ref 13.0–17.7)
IMMATURE GRANULOCYTES: 0 %
Immature Grans (Abs): 0 10*3/uL (ref 0.0–0.1)
LYMPHS: 18 %
Lymphocytes Absolute: 1.8 10*3/uL (ref 0.7–3.1)
MCH: 31.2 pg (ref 26.6–33.0)
MCHC: 33.3 g/dL (ref 31.5–35.7)
MCV: 94 fL (ref 79–97)
MONOS ABS: 0.7 10*3/uL (ref 0.1–0.9)
Monocytes: 7 %
NEUTROS PCT: 72 %
Neutrophils Absolute: 6.9 10*3/uL (ref 1.4–7.0)
Platelets: 172 10*3/uL (ref 150–379)
RBC: 4.43 x10E6/uL (ref 4.14–5.80)
RDW: 14.2 % (ref 12.3–15.4)
WBC: 9.7 10*3/uL (ref 3.4–10.8)

## 2016-12-07 LAB — CMP14+EGFR
ALT: 56 IU/L — ABNORMAL HIGH (ref 0–44)
AST: 45 IU/L — ABNORMAL HIGH (ref 0–40)
Albumin/Globulin Ratio: 1.5 (ref 1.2–2.2)
Albumin: 4 g/dL (ref 3.5–5.5)
Alkaline Phosphatase: 81 IU/L (ref 39–117)
BILIRUBIN TOTAL: 0.5 mg/dL (ref 0.0–1.2)
BUN/Creatinine Ratio: 23 — ABNORMAL HIGH (ref 9–20)
BUN: 31 mg/dL — ABNORMAL HIGH (ref 6–24)
CALCIUM: 9.6 mg/dL (ref 8.7–10.2)
CHLORIDE: 98 mmol/L (ref 96–106)
CO2: 22 mmol/L (ref 18–29)
Creatinine, Ser: 1.34 mg/dL — ABNORMAL HIGH (ref 0.76–1.27)
GFR calc non Af Amer: 58 mL/min/{1.73_m2} — ABNORMAL LOW (ref >59)
GFR, EST AFRICAN AMERICAN: 67 mL/min/{1.73_m2} (ref >59)
GLUCOSE: 294 mg/dL — AB (ref 65–99)
Globulin, Total: 2.7 g/dL (ref 1.5–4.5)
Potassium: 5.1 mmol/L (ref 3.5–5.2)
Sodium: 138 mmol/L (ref 134–144)
TOTAL PROTEIN: 6.7 g/dL (ref 6.0–8.5)

## 2016-12-07 LAB — LIPID PANEL
CHOL/HDL RATIO: 5.9 ratio — AB (ref 0.0–5.0)
Cholesterol, Total: 160 mg/dL (ref 100–199)
HDL: 27 mg/dL — ABNORMAL LOW (ref >39)
Triglycerides: 483 mg/dL — ABNORMAL HIGH (ref 0–149)

## 2016-12-07 LAB — TSH+FREE T4
FREE T4: 1.2 ng/dL (ref 0.82–1.77)
TSH: 8.06 u[IU]/mL — AB (ref 0.450–4.500)

## 2016-12-08 ENCOUNTER — Other Ambulatory Visit: Payer: Self-pay | Admitting: Family Medicine

## 2016-12-08 MED ORDER — ROSUVASTATIN CALCIUM 20 MG PO TABS: 20.0000 mg | ORAL_TABLET | Freq: Every day | ORAL | 3 refills | Status: DC

## 2016-12-08 MED ORDER — LEVOTHYROXINE SODIUM 100 MCG PO TABS: ORAL_TABLET | ORAL | 2 refills | Status: DC

## 2016-12-09 ENCOUNTER — Other Ambulatory Visit: Payer: Self-pay | Admitting: *Deleted

## 2016-12-09 DIAGNOSIS — E038 Other specified hypothyroidism: Secondary | ICD-10-CM

## 2016-12-16 ENCOUNTER — Telehealth: Payer: Self-pay | Admitting: Physical Medicine & Rehabilitation

## 2016-12-16 ENCOUNTER — Encounter (HOSPITAL_COMMUNITY): Payer: Self-pay | Admitting: *Deleted

## 2016-12-16 ENCOUNTER — Emergency Department (HOSPITAL_COMMUNITY)
Admission: EM | Admit: 2016-12-16 | Discharge: 2016-12-18 | Disposition: A | Discharge status: Home / Self Care | Payer: BLUE CROSS/BLUE SHIELD | Attending: Emergency Medicine | Admitting: Emergency Medicine

## 2016-12-16 DIAGNOSIS — F4325 Adjustment disorder with mixed disturbance of emotions and conduct: Secondary | ICD-10-CM | POA: Diagnosis present

## 2016-12-16 DIAGNOSIS — Z79899 Other long term (current) drug therapy: Secondary | ICD-10-CM | POA: Diagnosis not present

## 2016-12-16 DIAGNOSIS — Z7984 Long term (current) use of oral hypoglycemic drugs: Secondary | ICD-10-CM | POA: Diagnosis not present

## 2016-12-16 DIAGNOSIS — E1122 Type 2 diabetes mellitus with diabetic chronic kidney disease: Secondary | ICD-10-CM | POA: Insufficient documentation

## 2016-12-16 DIAGNOSIS — I509 Heart failure, unspecified: Secondary | ICD-10-CM | POA: Insufficient documentation

## 2016-12-16 DIAGNOSIS — I130 Hypertensive heart and chronic kidney disease with heart failure and stage 1 through stage 4 chronic kidney disease, or unspecified chronic kidney disease: Secondary | ICD-10-CM | POA: Diagnosis not present

## 2016-12-16 DIAGNOSIS — E039 Hypothyroidism, unspecified: Secondary | ICD-10-CM | POA: Insufficient documentation

## 2016-12-16 DIAGNOSIS — F4329 Adjustment disorder with other symptoms: Secondary | ICD-10-CM

## 2016-12-16 DIAGNOSIS — N189 Chronic kidney disease, unspecified: Secondary | ICD-10-CM | POA: Insufficient documentation

## 2016-12-16 DIAGNOSIS — I69398 Other sequelae of cerebral infarction: Secondary | ICD-10-CM

## 2016-12-16 DIAGNOSIS — Z85818 Personal history of malignant neoplasm of other sites of lip, oral cavity, and pharynx: Secondary | ICD-10-CM | POA: Insufficient documentation

## 2016-12-16 DIAGNOSIS — F0631 Mood disorder due to known physiological condition with depressive features: Secondary | ICD-10-CM

## 2016-12-16 LAB — CBC WITH DIFFERENTIAL/PLATELET
BASOS ABS: 0 10*3/uL (ref 0.0–0.1)
Basophils Relative: 0 %
EOS ABS: 0.2 10*3/uL (ref 0.0–0.7)
EOS PCT: 2 %
HEMATOCRIT: 41 % (ref 39.0–52.0)
Hemoglobin: 14.2 g/dL (ref 13.0–17.0)
Lymphocytes Relative: 16 %
Lymphs Abs: 1.7 10*3/uL (ref 0.7–4.0)
MCH: 32 pg (ref 26.0–34.0)
MCHC: 34.6 g/dL (ref 30.0–36.0)
MCV: 92.3 fL (ref 78.0–100.0)
MONO ABS: 0.7 10*3/uL (ref 0.1–1.0)
Monocytes Relative: 7 %
Neutro Abs: 7.9 10*3/uL — ABNORMAL HIGH (ref 1.7–7.7)
Neutrophils Relative %: 75 %
Platelets: 161 10*3/uL (ref 150–400)
RBC: 4.44 MIL/uL (ref 4.22–5.81)
RDW: 13.4 % (ref 11.5–15.5)
WBC: 10.5 10*3/uL (ref 4.0–10.5)

## 2016-12-16 LAB — RAPID URINE DRUG SCREEN, HOSP PERFORMED
AMPHETAMINES: NOT DETECTED
BARBITURATES: NOT DETECTED
BENZODIAZEPINES: NOT DETECTED
Cocaine: NOT DETECTED
Opiates: NOT DETECTED
Tetrahydrocannabinol: NOT DETECTED

## 2016-12-16 LAB — CBG MONITORING, ED: GLUCOSE-CAPILLARY: 266 mg/dL — AB (ref 65–99)

## 2016-12-16 LAB — COMPREHENSIVE METABOLIC PANEL
ALK PHOS: 71 U/L (ref 38–126)
ALT: 58 U/L (ref 17–63)
AST: 49 U/L — ABNORMAL HIGH (ref 15–41)
Albumin: 3.7 g/dL (ref 3.5–5.0)
Anion gap: 8 (ref 5–15)
BUN: 31 mg/dL — AB (ref 6–20)
CALCIUM: 9.3 mg/dL (ref 8.9–10.3)
CO2: 24 mmol/L (ref 22–32)
CREATININE: 1.55 mg/dL — AB (ref 0.61–1.24)
Chloride: 102 mmol/L (ref 101–111)
GFR calc non Af Amer: 48 mL/min — ABNORMAL LOW (ref >60)
GFR, EST AFRICAN AMERICAN: 55 mL/min — AB (ref >60)
Glucose, Bld: 323 mg/dL — ABNORMAL HIGH (ref 65–99)
Potassium: 4.7 mmol/L (ref 3.5–5.1)
Sodium: 134 mmol/L — ABNORMAL LOW (ref 135–145)
Total Bilirubin: 0.6 mg/dL (ref 0.3–1.2)
Total Protein: 7.1 g/dL (ref 6.5–8.1)

## 2016-12-16 LAB — ETHANOL: Alcohol, Ethyl (B): <5 mg/dL (ref <5)

## 2016-12-16 MED ORDER — LEVOTHYROXINE SODIUM 88 MCG PO TABS
88.0000 ug | ORAL_TABLET | Freq: Every day | ORAL | Status: DC
Start: 2016-12-17 — End: 2016-12-18
  Administered 2016-12-17 – 2016-12-18 (×2): 88 ug via ORAL
  Filled 2016-12-16 (×4): qty 1

## 2016-12-16 MED ORDER — METFORMIN HCL ER 500 MG PO TB24
1000.0000 mg | ORAL_TABLET | Freq: Every day | ORAL | Status: DC
  Administered 2016-12-17 – 2016-12-18 (×2): 1000 mg via ORAL
  Filled 2016-12-16 (×4): qty 2

## 2016-12-16 MED ORDER — LISINOPRIL 10 MG PO TABS
40.0000 mg | ORAL_TABLET | Freq: Every day | ORAL | Status: DC
  Administered 2016-12-16 – 2016-12-18 (×3): 40 mg via ORAL
  Filled 2016-12-16 (×3): qty 4

## 2016-12-16 MED ORDER — ARIPIPRAZOLE 5 MG PO TABS
5.0000 mg | ORAL_TABLET | Freq: Every day | ORAL | Status: DC
  Administered 2016-12-17 – 2016-12-18 (×2): 5 mg via ORAL
  Filled 2016-12-16 (×4): qty 1

## 2016-12-16 MED ORDER — GABAPENTIN 300 MG PO CAPS
300.0000 mg | ORAL_CAPSULE | Freq: Three times a day (TID) | ORAL | Status: DC
  Administered 2016-12-16 – 2016-12-18 (×6): 300 mg via ORAL
  Filled 2016-12-16 (×6): qty 1

## 2016-12-16 MED ORDER — AMLODIPINE BESYLATE 5 MG PO TABS
5.0000 mg | ORAL_TABLET | Freq: Every day | ORAL | Status: DC
  Administered 2016-12-16 – 2016-12-18 (×3): 5 mg via ORAL
  Filled 2016-12-16 (×3): qty 1

## 2016-12-16 MED ORDER — CLOPIDOGREL BISULFATE 75 MG PO TABS
75.0000 mg | ORAL_TABLET | Freq: Every day | ORAL | Status: DC
  Administered 2016-12-16 – 2016-12-18 (×3): 75 mg via ORAL
  Filled 2016-12-16 (×3): qty 1

## 2016-12-16 MED ORDER — PANTOPRAZOLE SODIUM 40 MG PO TBEC
40.0000 mg | DELAYED_RELEASE_TABLET | Freq: Every day | ORAL | Status: DC
  Administered 2016-12-16 – 2016-12-18 (×3): 40 mg via ORAL
  Filled 2016-12-16 (×3): qty 1

## 2016-12-16 MED ORDER — GLIMEPIRIDE 2 MG PO TABS
2.0000 mg | ORAL_TABLET | Freq: Every day | ORAL | Status: DC
  Administered 2016-12-17 – 2016-12-18 (×2): 2 mg via ORAL
  Filled 2016-12-16 (×4): qty 1

## 2016-12-16 MED ORDER — CARVEDILOL 12.5 MG PO TABS
25.0000 mg | ORAL_TABLET | Freq: Two times a day (BID) | ORAL | Status: DC
  Administered 2016-12-16 – 2016-12-18 (×5): 25 mg via ORAL
  Filled 2016-12-16 (×5): qty 2

## 2016-12-16 NOTE — ED Notes (Addendum)
Pt assisted with changing into paper purple scrub bottoms and gown (scrub top is to small), Pt belongings include- jogging pants, underwear, shirt, tennis shoes, leg brace, glasses with case, and cell phone placed in pt belonging bag.

## 2016-12-16 NOTE — ED Notes (Addendum)
Pt continues to be calm, cooperative and pleasant.  No signs of aggression of violent behavior.  [place] to re-evaluate this morning.

## 2016-12-16 NOTE — Progress Notes (Signed)
Inpatient Diabetes Program Recommendations  AACE/ADA: New Consensus Statement on Inpatient Glycemic Control (2015)  Target Ranges:  Prepandial:   less than 140 mg/dL      Peak postprandial:   less than 180 mg/dL (1-2 hours)      Critically ill patients:  140 - 180 mg/dL   Results for Brian Hall, Brian Hall (MRN [id number]) as of 12/16/2016 14:46  Ref. Range 12/16/2016 03:06  Glucose Latest Ref Range: 65 - 99 mg/dL 323 (H)   Review of Glycemic Control  Diabetes history: DM2 Outpatient Diabetes medications: Amaryl 2 mg QAM, Glumetza 1000 mg QAM Current orders for Inpatient glycemic control: None  Inpatient Diabetes Program Recommendations: Correction (SSI): While holding for inpatient placement, please consider ordering CBGs with Novolog correction scale ACHS. Oral Agents: Please consider re-ordering outpatient DM medications.  Thanks, Barnie Alderman, RN, MSN, CDE Diabetes Coordinator Inpatient Diabetes Program [phone] (Team Pager from 8am to 5pm)

## 2016-12-16 NOTE — ED Notes (Signed)
Pt has been very pleasant, cooperative, and calm since arrival to ED. No signs of aggression or violence displayed.

## 2016-12-16 NOTE — ED Notes (Signed)
Patient helped with urinal. Peri care done.
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
## 2016-12-16 NOTE — Telephone Encounter (Signed)
noted 
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
## 2016-12-16 NOTE — ED Notes (Signed)
Pt's linen changed at this time and assisted to chair.
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
## 2016-12-16 NOTE — ED Notes (Signed)
Patient resting on stretcher watching TV. Patient is very cooperative. Patient asking for Sprite zero at this time.

## 2016-12-16 NOTE — ED Notes (Signed)
Per Gaynell Face, Counselor with TTS, states the pt would have a psych re-eval in the morning.

## 2016-12-16 NOTE — ED Notes (Signed)
DM RN called stated that home DM meds need to be ordered. MD [facility] notified.
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
## 2016-12-16 NOTE — ED Notes (Signed)
[employer] officer remains at bedside.

## 2016-12-16 NOTE — Progress Notes (Signed)
Pt meets criteria for inpatient admission; CSW faxed referral packet to the following facilities in attempts to secure inpatient bed:   [facility]  TTS will continue to seek placement.   Adriana Reams, LCSW Clinical Social Work [phone]

## 2016-12-17 ENCOUNTER — Encounter: Payer: BLUE CROSS/BLUE SHIELD | Admitting: Psychology

## 2016-12-17 LAB — CBG MONITORING, ED
GLUCOSE-CAPILLARY: 207 mg/dL — AB (ref 65–99)
GLUCOSE-CAPILLARY: 341 mg/dL — AB (ref 65–99)
Glucose-Capillary: 224 mg/dL — ABNORMAL HIGH (ref 65–99)
Glucose-Capillary: 243 mg/dL — ABNORMAL HIGH (ref 65–99)

## 2016-12-17 NOTE — ED Notes (Signed)
MD Reather Converse and Kathrynn Humble made aware of CBG and the note in about insulin in the ED. States for CBGs to be done several times a day a report back. No further orders at this time.

## 2016-12-17 NOTE — BH Assessment (Signed)
Writer reviewed patient with [facility] Attending Physician, Dr. Jerilee Hoh, Assistant Director [facility] & Charge Nurse, Jiles Harold. At this time patient is unable to transfer to [facility]U due to the acuity of both the patient and the unit.  Ongoing assessments for the unit will take place to determine if and when he can be transferred

## 2016-12-17 NOTE — Progress Notes (Signed)
Pt declined by [facility] due to medical reasons per [facility]. Attempted to contact [facility] to follow up on the bed status but I did not get an answer. Voice mail was left with intake requesting update on bed status.  Lind Covert, MSW, [place] Disposition [phone]

## 2016-12-17 NOTE — ED Notes (Signed)
Patient requested to sit up in a chair.  Recliner placed in patient's room.  Patient up to recliner with assistance.
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
## 2016-12-17 NOTE — ED Notes (Signed)
EDP Zavitz made aware of blood pressure.

## 2016-12-17 NOTE — Progress Notes (Signed)
CSW attempted to contact [facility] in order to confirm the referral had been received but did not get an answer. A voice mail was left for intake requesting a call back to discuss possible placement.  Lind Covert, MSW, [place] Disposition [phone]

## 2016-12-17 NOTE — Progress Notes (Signed)
CSW faxed referral to [facility] for potential inpt admission.  Lind Covert, MSW, [place] Disposition [phone]

## 2016-12-17 NOTE — ED Notes (Signed)
TTS placed in room.  

## 2016-12-17 NOTE — ED Notes (Signed)
Pt given breakfast tray

## 2016-12-18 DIAGNOSIS — F4329 Adjustment disorder with other symptoms: Secondary | ICD-10-CM

## 2016-12-18 DIAGNOSIS — R45850 Homicidal ideations: Secondary | ICD-10-CM

## 2016-12-18 LAB — CBG MONITORING, ED
GLUCOSE-CAPILLARY: 248 mg/dL — AB (ref 65–99)
Glucose-Capillary: 190 mg/dL — ABNORMAL HIGH (ref 65–99)

## 2016-12-18 NOTE — Progress Notes (Signed)
Pt has been re-evaluated by Margarita Grizzle, NP and has been recommended for d/c. Per Margarita Grizzle, NP pt is denying SI, HI, and not experiencing AVH. Pt is psych cleared and stable for d/c. IVC recommended to be rescinded. Case discussed with Dr. Thurnell Garbe who states she will review the chart and follow through with the recommendation.   Lind Covert, MSW, [place] Disposition [phone]

## 2016-12-18 NOTE — ED Provider Notes (Signed)
Pt has been re-evaluated by Psych NP: pt denies SI, HI, no AVH, no criteria to admit at this time, recommends to rescind IVC and d/c pt with outpt resources. Will d/c stable.    Francine Graven, DO 12/18/16 1146
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
## 2016-12-18 NOTE — ED Notes (Signed)
This patient was left on the track board since 1418.  The patient is being removed from the track board at this time.  This nurse had no participation in this patient's care.

## 2016-12-18 NOTE — Discharge Instructions (Signed)
Take your usual prescriptions as previously directed.  Call your regular medical doctor today to schedule a follow up appointment within the next 2 days. Call the mental health resources given to you today to schedule a follow up appointment within the next week.  Return to the Emergency Department immediately sooner if worsening.

## 2016-12-18 NOTE — Progress Notes (Addendum)
Inpatient Diabetes Program Recommendations  AACE/ADA: New Consensus Statement on Inpatient Glycemic Control (2015)  Target Ranges:  Prepandial:   less than 140 mg/dL      Peak postprandial:   less than 180 mg/dL (1-2 hours)      Critically ill patients:  140 - 180 mg/dL   Results for KODEN, HUNZEKER (MRN [id number]) as of 12/18/2016 09:20  Ref. Range 12/17/2016 07:21 12/17/2016 09:32 12/17/2016 15:53 12/17/2016 19:19 12/18/2016 06:34  Glucose-Capillary Latest Ref Range: 65 - 99 mg/dL 207 (H) 341 (H) 224 (H) 243 (H) 190 (H)   Review of Glycemic Control  Diabetes history: DM2 Outpatient Diabetes medications: Amaryl 2 mg QAM, Glumetza 1000 mg QAM Current orders for Inpatient glycemic control: Amaryl 2 mg QAM, Metformin XR 1000 mg QAM  Inpatient Diabetes Program Recommendations: Correction (SSI): While holding for inpatient placement, please consider using Glycemic Control order set and ordering CBGs with Novolog correction scale ACHS. Oral Agents: May want to consider increasing Amaryl 2 mg BID.  Thanks, Barnie Alderman, RN, MSN, CDE Diabetes Coordinator Inpatient Diabetes Program [phone] (Team Pager from 8am to 5pm)
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
## 2016-12-20 ENCOUNTER — Ambulatory Visit (INDEPENDENT_AMBULATORY_CARE_PROVIDER_SITE_OTHER): Payer: BLUE CROSS/BLUE SHIELD | Admitting: Family Medicine

## 2016-12-20 ENCOUNTER — Encounter: Payer: Self-pay | Admitting: Family Medicine

## 2016-12-20 VITALS — BP 131/78 | HR 82 | Temp 98.0°F | Ht 71.0 in | Wt 261.0 lb

## 2016-12-20 DIAGNOSIS — I69398 Other sequelae of cerebral infarction: Secondary | ICD-10-CM | POA: Diagnosis not present

## 2016-12-20 DIAGNOSIS — F4329 Adjustment disorder with other symptoms: Secondary | ICD-10-CM

## 2016-12-20 DIAGNOSIS — F0631 Mood disorder due to known physiological condition with depressive features: Secondary | ICD-10-CM | POA: Diagnosis not present

## 2016-12-20 DIAGNOSIS — R269 Unspecified abnormalities of gait and mobility: Secondary | ICD-10-CM

## 2016-12-20 DIAGNOSIS — I69354 Hemiplegia and hemiparesis following cerebral infarction affecting left non-dominant side: Secondary | ICD-10-CM | POA: Diagnosis not present

## 2016-12-20 MED ORDER — ARIPIPRAZOLE 10 MG PO TABS: 10.0000 mg | ORAL_TABLET | Freq: Every day | ORAL | 2 refills | Status: DC
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
## 2016-12-24 ENCOUNTER — Other Ambulatory Visit: Payer: Self-pay | Admitting: Family Medicine

## 2016-12-25 ENCOUNTER — Other Ambulatory Visit: Payer: Self-pay | Admitting: Family Medicine

## 2016-12-31 ENCOUNTER — Encounter: Payer: BLUE CROSS/BLUE SHIELD | Attending: Physical Medicine & Rehabilitation | Admitting: Psychology

## 2016-12-31 DIAGNOSIS — F4321 Adjustment disorder with depressed mood: Secondary | ICD-10-CM

## 2016-12-31 DIAGNOSIS — G8110 Spastic hemiplegia affecting unspecified side: Secondary | ICD-10-CM | POA: Diagnosis not present

## 2016-12-31 DIAGNOSIS — R269 Unspecified abnormalities of gait and mobility: Secondary | ICD-10-CM | POA: Diagnosis not present

## 2016-12-31 DIAGNOSIS — I63511 Cerebral infarction due to unspecified occlusion or stenosis of right middle cerebral artery: Secondary | ICD-10-CM | POA: Diagnosis present

## 2016-12-31 DIAGNOSIS — I69398 Other sequelae of cerebral infarction: Secondary | ICD-10-CM | POA: Diagnosis not present

## 2017-01-06 ENCOUNTER — Ambulatory Visit: Payer: BLUE CROSS/BLUE SHIELD | Admitting: Family Medicine

## 2017-01-06 ENCOUNTER — Ambulatory Visit (INDEPENDENT_AMBULATORY_CARE_PROVIDER_SITE_OTHER): Payer: BLUE CROSS/BLUE SHIELD | Admitting: Family Medicine

## 2017-01-06 ENCOUNTER — Encounter: Payer: Self-pay | Admitting: Family Medicine

## 2017-01-06 VITALS — BP 114/67 | HR 65 | Temp 98.5°F | Ht 71.0 in | Wt 259.0 lb

## 2017-01-06 DIAGNOSIS — I69354 Hemiplegia and hemiparesis following cerebral infarction affecting left non-dominant side: Secondary | ICD-10-CM | POA: Diagnosis not present

## 2017-01-06 DIAGNOSIS — I69398 Other sequelae of cerebral infarction: Secondary | ICD-10-CM | POA: Diagnosis not present

## 2017-01-06 DIAGNOSIS — F0631 Mood disorder due to known physiological condition with depressive features: Secondary | ICD-10-CM | POA: Diagnosis not present

## 2017-01-06 DIAGNOSIS — R4586 Emotional lability: Secondary | ICD-10-CM | POA: Diagnosis not present

## 2017-01-06 IMAGING — MR MR MRA HEAD W/O CM
10 of 11 series · 29 of 48 positions shown · non-contrast
Comparison: Prior CT from 09/12/2014.

CLINICAL DATA: Acute left-sided weakness. Evaluate for stroke.
Initial evaluation.

EXAM:
MRI HEAD WITHOUT CONTRAST
MRA HEAD WITHOUT CONTRAST
TECHNIQUE: Multiplanar, multiecho pulse sequences of the brain and surrounding
structures were obtained without intravenous contrast. Angiographic
images of the head were obtained using MRA technique without
contrast.

[Series 3: DWI · axial · 3.0mm · 1.09mm/px · z∈[-67,+65]mm · 6 of 90 slices shown (1 of 4)]
[im 1/90]
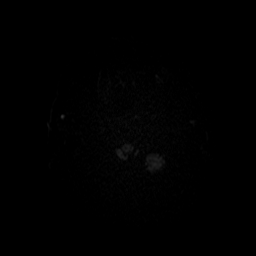
[im 18/90]
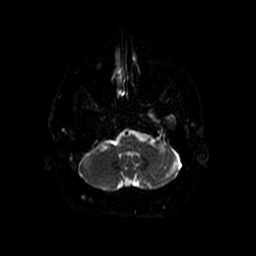
[im 36/90]
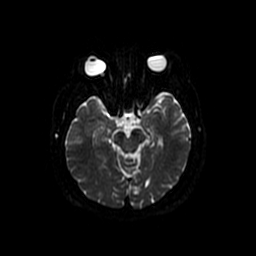
[im 54/90]
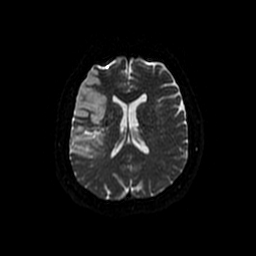
[im 72/90]
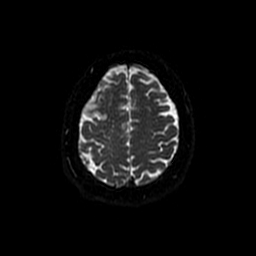
[im 90/90]
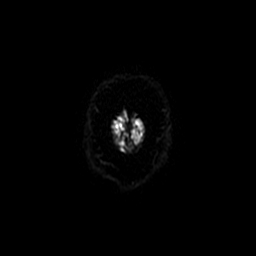

[Series 4: T1 · sagittal · 5.0mm · 0.47mm/px · 2 of 25 slices shown]
[im 1/25]
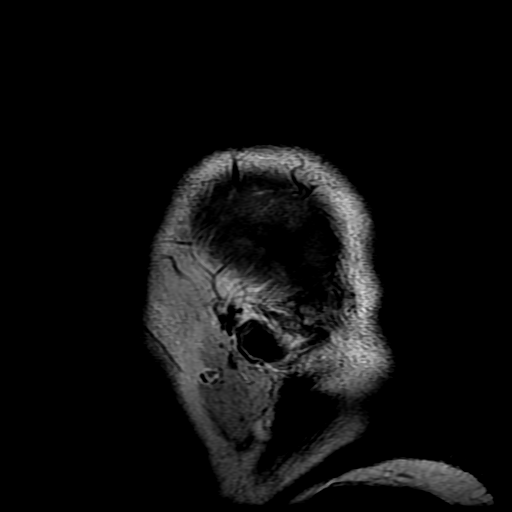
[im 25/25]
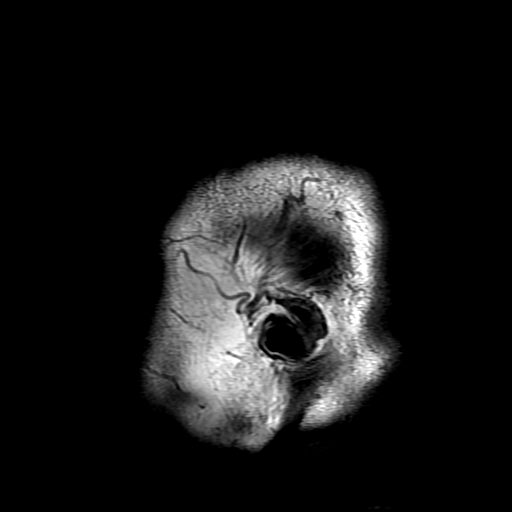

[Series 5: DWI · coronal · 5.0mm · 1.09mm/px · 5 of 66 slices shown (2 of 4)]
[im 1/66]
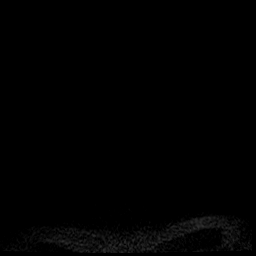
[im 17/66]
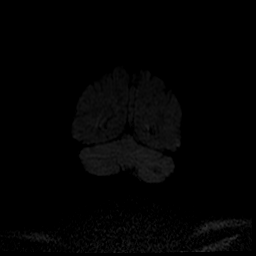
[im 33/66]
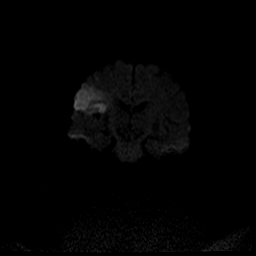
[im 49/66]
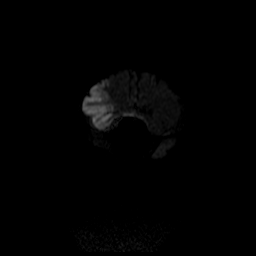
[im 66/66]
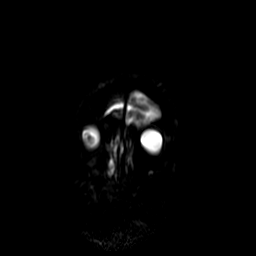

[Series 7: T2 · axial · 5.0mm · 0.43mm/px · z∈[-77,+66]mm · 2 of 25 slices shown (1 of 2)]
[im 1/25]
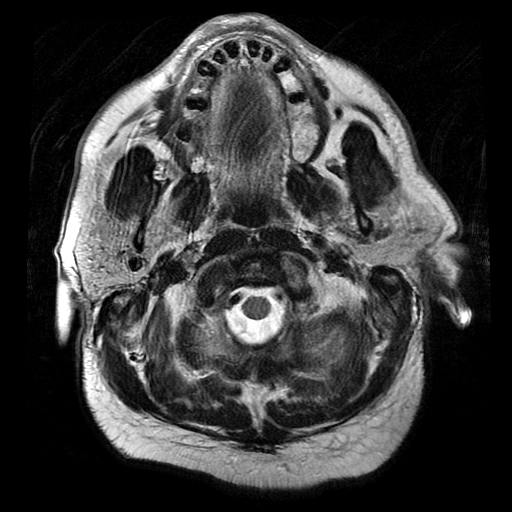
[im 25/25]
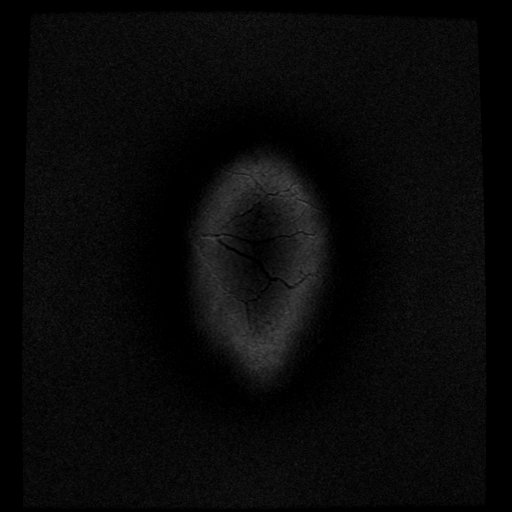

[Series 9: ax mpgr · axial · 5.0mm · 0.43mm/px · z∈[-77,+66]mm · 2 of 25 slices shown]
[im 1/25]
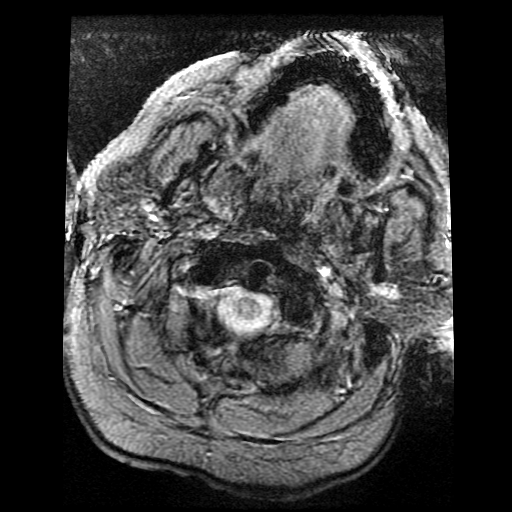
[im 25/25]
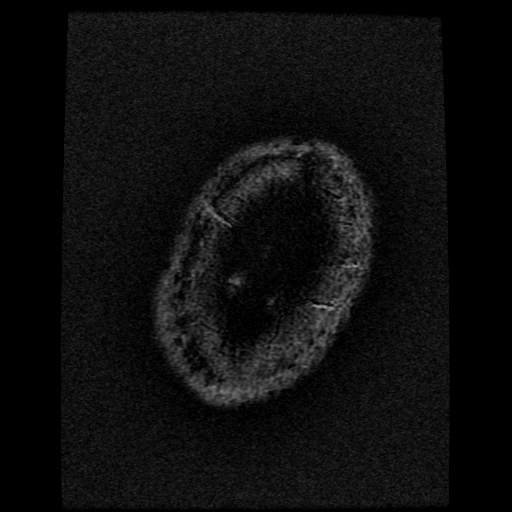

[Series 11: FLAIR · axial · 5.0mm · 0.43mm/px · z∈[-72,+71]mm · 2 of 25 slices shown]
[im 1/25]
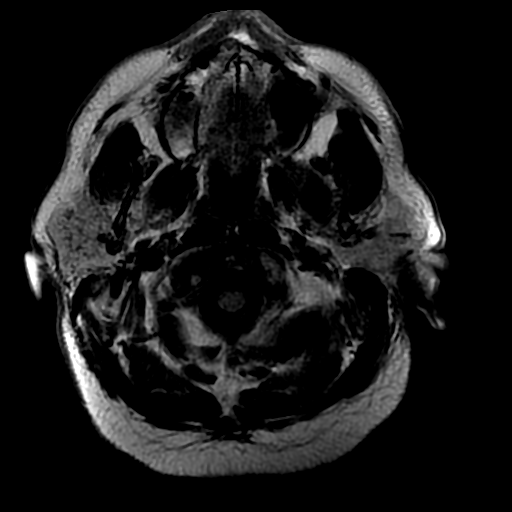
[im 25/25]
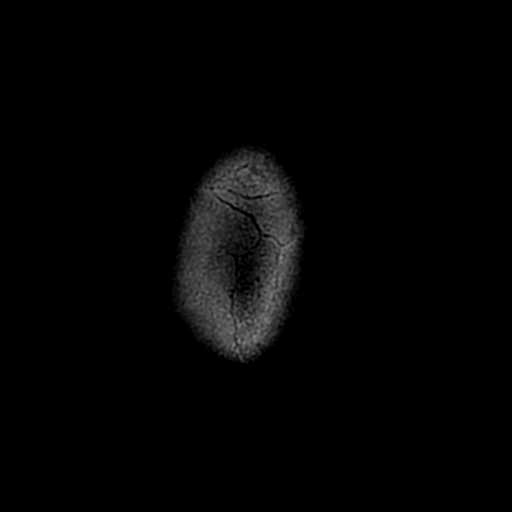

[Series 12: (id) mt fs · axial · 1.4mm · 0.43mm/px · 1 of 154 slices shown]
[im 1/154]
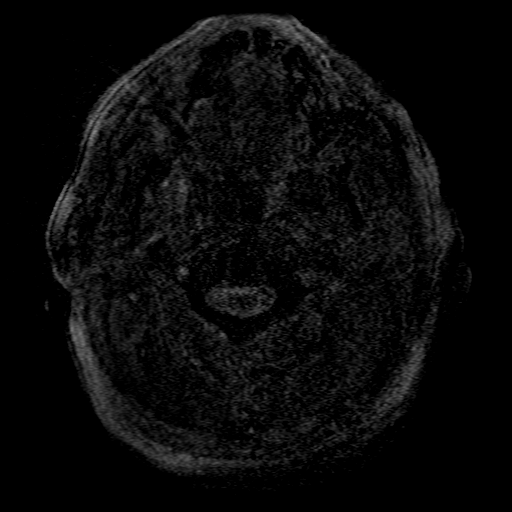

[Series 14: T2 · coronal · 5.0mm · 0.43mm/px · 2 of 29 slices shown (2 of 2)]
[im 1/29]
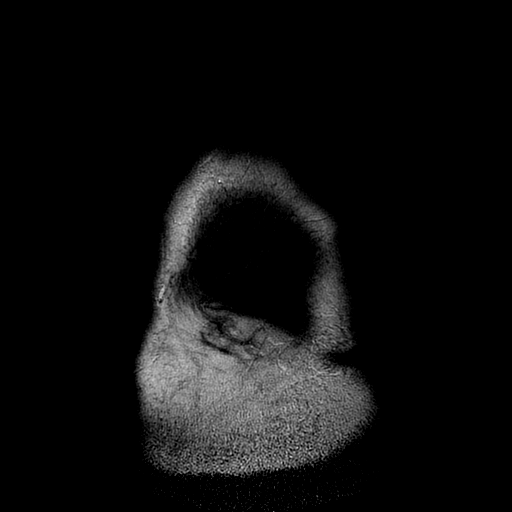
[im 29/29]
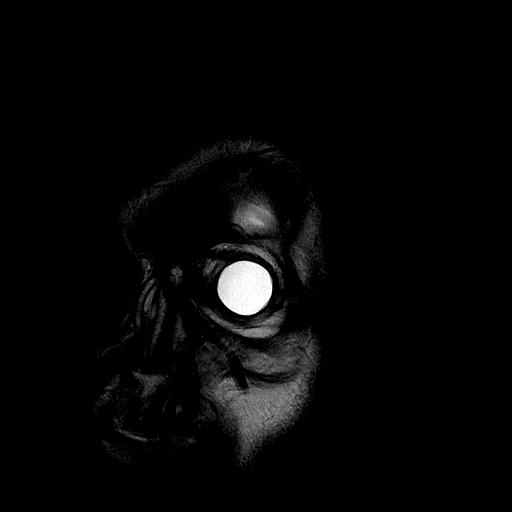

[Series 300: DWI · axial · 3.0mm · 1.09mm/px · z∈[-67,+65]mm · 4 of 45 slices shown (3 of 4)]
[im 1/45]
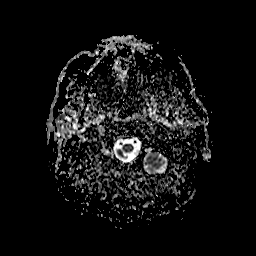
[im 15/45]
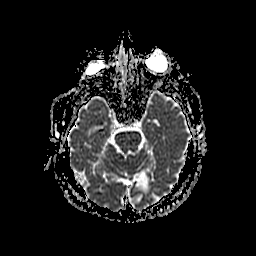
[im 30/45]
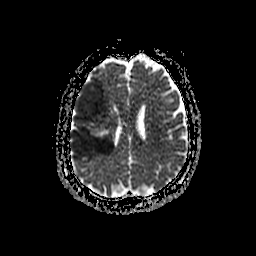
[im 45/45]
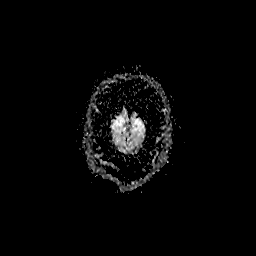

[Series 500: DWI · coronal · 5.0mm · 1.09mm/px · 3 of 33 slices shown (4 of 4)]
[im 1/33]
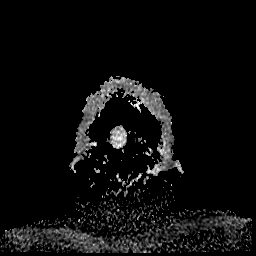
[im 17/33]
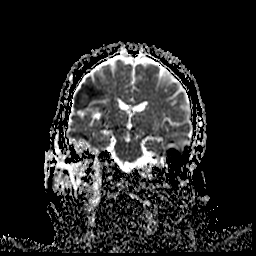
[im 33/33]
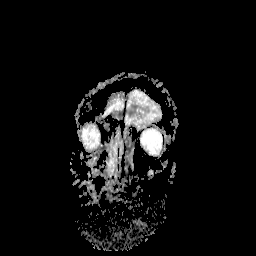

[29 of 48 positions shown; findings below may reference images not displayed]

FINDINGS: MRI HEAD FINDINGS

Diffuse prominence of the CSF containing spaces is compatible with
generalized cerebral atrophy. Minimal T2/FLAIR hyperintensity within
the periventricular white matter noted, most consistent with chronic
small vessel ischemic disease. Encephalomalacia within the medial
left occipital lobe compatible with remote left PCA territory
infarct. There are scattered remote left cerebellar infarcts.

Extensive restricted diffusion is seen involving the right frontal
lobe, compatible with acute right MCA territory infarct. There is
associated gyral swelling with edema in the area of infarct without
significant mass effect. Probable minimal petechial hemorrhage seen
within this region on gradient echo sequence without frank
hemorrhagic conversion. No involvement of the right basal ganglia.
No other acute intracranial infarct. Gray-white matter
differentiation otherwise maintained.

No mass lesion or midline shift. No hydrocephalus. No extra-axial
fluid collection.

No acute abnormality seen about the orbits.

Pituitary gland within normal limits.

Mild cerebellar tonsillar ectopia L4-5 mm noted at the
craniocervical junction. Visualized upper cervical spine within
normal limits.

Visualized bone marrow signal intensity normal.

Paranasal sinuses are clear. Scattered fluid signal intensity noted
within the mastoid air cells bilaterally, right greater than left.

MRA HEAD FINDINGS

ANTERIOR CIRCULATION:

Study is degraded by motion artifact.

The distal right internal carotid artery appears to be occluded. No
flow seen within the petrous or cavernous segments of the right ICA
is well. There is some distal reconstitution at the right ICA
terminus via flow from the contralateral left ICA system across the
circle of Willis. Markedly diminutive an attenuated flow seen within
the right M1 segment and distal right MCA branches.

Visualized distal aspect of the cervical left ICA widely patent with
antegrade flow. The petrous, cavernous, and supra clinoid segments
patent without hemodynamically significant stenosis. Left A1 segment
and right A1 segment well opacified. The left A2 segment is well
opacified. The right A2 segment is markedly diminutive in not well
visualized. This may be due to occlusion, high-grade stenosis, or
poor flow due to the occluded right ICA.

Left M1 segment well opacified. Atherosclerotic irregularity noted
within the distal left MCA branches.

POSTERIOR CIRCULATION:

Dominant. There is a focal severe stenosis within the distal left
vertebral artery prior to the loop vertebrobasilar junction.
Posterior inferior cerebral artery is not visualized on this exam.
Mild multi focal atherosclerotic irregularity present within the
basilar artery which remains widely patent. Superior cerebellar
arteries are patent proximally, but not well visualized distally.

Right P1 and P2 segments are patent without significant stenosis.
The left P1 and proximal P2 segments appear widely patent. The
distal left P2 segment is not visualized, and may be occluded.

No aneurysm or vascular malformation.
IMPRESSION: MRI HEAD IMPRESSION:

1. Acute large territory right MCA territory infarct involving the
right frontal lobe without significant mass effect.
2. Remote infarcts involving the medial left occipital lobe and left
cerebellar hemisphere.
3. Generalized cerebral atrophy with mild chronic small vessel
ischemic disease.

MRA HEAD IMPRESSION:

1. Occluded right internal carotid artery. There is some distal
reconstitution at the right ICA terminus via flow across the circle
Willis from the contralateral left ICA system. Flow within the right
M1 segment and distal right MCA branches is markedly attenuated, and
likely inadequate.
2. Poor visualization of the right A2 segment, likely due to
occlusion of the right ICA with limited flow across the circle of
Willis.
3. Nonvisualization of the mid -distal left PCA, likely occluded
given the presence of remote left occipital infarct.
4. Focal short segment severe stenosis within the distal left
vertebral artery. The right vertebral artery is dominant and widely
patent.

## 2017-01-06 MED ORDER — ARIPIPRAZOLE 15 MG PO TABS: 15.0000 mg | ORAL_TABLET | Freq: Every day | ORAL | 2 refills | Status: DC

## 2017-01-06 NOTE — Progress Notes (Signed)
Subjective:  Patient ID: Brian Hall, male    DOB: 1956-07-27  Age: 61 y.o. MRN: [id number]  CC: Follow-up (pt here today following up after CVA)   HPI Brian Hall presents forFollow-up of diabetes. Patient checks blood sugar at home.  160s fasting. Patient denies symptoms such as polyuria, polydipsia, excessive hunger, nausea No significant hypoglycemic spells noted.Working on cutting back on dietary excesses including milk - down to one glass a day.  Medications reviewed. Pt reports taking them regularly without complication/adverse reaction being reported today.  Mood improving. Less emotional, less angry. Denies side effects from the abilify. Still some marital discord.  Getting along better with wife. 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   He has a past surgical history that includes Cardiac catheterization and Neck dissection (Right, 2011).   His family history includes Arthritis/Rheumatoid in his brother; Hypertension in his father and mother; Stroke in his father and sister.He reports that he has never smoked. He has never used smokeless tobacco. He reports that he does not drink alcohol or use drugs.  Current Outpatient Prescriptions on File Prior to Visit  Medication Sig Dispense Refill  . amLODipine (NORVASC) 5 MG tablet take 1 tablet by mouth once daily for BLOOD PRESSURE 30 tablet 3  . carvedilol (COREG) 25 MG tablet take 1 tablet by mouth twice a day 60 tablet 3  . cholecalciferol (VITAMIN D) 400 UNITS TABS tablet Take 400 Units by mouth daily.      . clopidogrel (PLAVIX) 75 MG tablet take 1 tablet by mouth daily 30 tablet 2  . gabapentin (NEURONTIN) 300 MG capsule take 1 capsule by mouth three times a day 90 capsule 3  . levothyroxine (SYNTHROID, LEVOTHROID) 100 MCG tablet Take 100 mcg by mouth daily before breakfast.  0  . lisinopril (PRINIVIL,ZESTRIL) 40 MG tablet take 1 tablet by mouth daily 30 tablet 5  . metFORMIN (GLUMETZA) 1000 MG (MOD) 24 hr tablet Take 1 tablet (1,000 mg total) by mouth daily with breakfast. 30 tablet 5  . Multiple Vitamins-Minerals (MULTIVITAMIN WITH MINERALS) tablet Take 1 tablet by mouth daily.    . Naproxen Sodium (ALEVE) 220 MG CAPS Take 2 capsules by mouth 2 (two) times daily.    Marland Kitchen omeprazole (PRILOSEC) 40 MG capsule take 1 capsule by mouth once daily 30 capsule 5  . ONGLYZA 5 MG TABS tablet take 1 tablet by mouth daily 30 tablet 2  . rosuvastatin (CRESTOR) 20 MG tablet Take 20 mg by mouth daily.  0  . tiZANidine (ZANAFLEX) 2 MG tablet Take 1 tablet (2 mg total) by mouth 3 (three) times daily. 90 tablet 2  . traMADol (ULTRAM) 50 MG tablet take 1 tablet by mouth every 6 hours if needed for MODERATE PAIN 60 tablet 3   No current facility-administered medications on file prior to visit.     ROS Review of Systems  Constitutional: Negative for chills, diaphoresis and fever.  HENT: Negative for rhinorrhea and sore throat.   Respiratory: Negative for cough and shortness of breath.   Cardiovascular: Negative for chest pain.  Gastrointestinal: Negative for abdominal pain.  Musculoskeletal: Positive for myalgias. Negative for arthralgias.  Skin:  Negative for rash.  Neurological: Positive for weakness (stable left hemiparesis). Negative for headaches.  Psychiatric/Behavioral: Positive for agitation and dysphoric mood. The patient is nervous/anxious.     Objective:  BP 114/67   Pulse 65   Temp 98.5 F (36.9 C) (Oral)   Ht 5\' 11"  (1.803 m)   Wt 259 lb (117.5 kg)   BMI 36.12 kg/m   BP Readings from  Last 3 Encounters:  01/06/17 114/67  12/20/16 131/78  12/18/16 (!) 144/78    Wt Readings from Last 3 Encounters:  01/06/17 259 lb (117.5 kg)  12/20/16 261 lb (118.4 kg)  12/16/16 265 lb (120.2 kg)     Physical Exam  Constitutional: He is oriented to person, place, and time. He appears well-developed and well-nourished. No distress.  HENT:  Head: Normocephalic and atraumatic.  Right Ear: External ear normal.  Left Ear: External ear normal.  Nose: Nose normal.  Mouth/Throat: Oropharynx is clear and moist.  Eyes: Conjunctivae and EOM are normal. Pupils are equal, round, and reactive to light.  Neck: Normal range of motion. Neck supple. No thyromegaly present.  Cardiovascular: Normal rate, regular rhythm and normal heart sounds.   No murmur heard. Pulmonary/Chest: Effort normal and breath sounds normal. No respiratory distress. He has no wheezes. He has no rales.  Abdominal: Soft. Bowel sounds are normal. He exhibits no distension. There is no tenderness.  Lymphadenopathy:    He has no cervical adenopathy.  Neurological: He is alert and oriented to person, place, and time. He has normal reflexes. He exhibits abnormal muscle tone (weak with contractures on left. WC bound).  Skin: Skin is warm and dry.  Psychiatric: He has a normal mood and affect. His behavior is normal. Judgment and thought content normal.       Assessment & Plan:   Brian Hall was seen today for follow-up.  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   I have changed Brian Hall ARIPiprazole. I am also having him maintain his multivitamin with minerals, cholecalciferol, Naproxen Sodium, lisinopril, traMADol, amLODipine, carvedilol, gabapentin, ONGLYZA, tiZANidine,  metFORMIN, rosuvastatin, levothyroxine, clopidogrel, omeprazole, and glimepiride.  Meds ordered this encounter  Medications  . glimepiride (AMARYL) 4 MG tablet    Sig: Take 4 mg by mouth daily with breakfast.    Refill:  0  . ARIPiprazole (ABILIFY) 15 MG tablet    Sig: Take 1 tablet (15 mg total) by mouth daily.    Dispense:  30 tablet    Refill:  2     Follow-up: Return in about 6 weeks (around 02/17/2017).  Claretta Fraise, M.D.

## 2017-01-15 ENCOUNTER — Telehealth: Payer: Self-pay | Admitting: Psychology
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
## 2017-01-20 ENCOUNTER — Encounter: Payer: Self-pay | Admitting: Psychology

## 2017-01-20 ENCOUNTER — Telehealth: Payer: Self-pay | Admitting: *Deleted

## 2017-01-20 NOTE — Telephone Encounter (Signed)
[name], Mandel's wife is calling to ask if Dr Sima Matas ever sees pt with wife.  She is coming to appt and would like to know.

## 2017-01-20 NOTE — Progress Notes (Signed)
Patient:  Brian Hall   DOB: February 20, 1956  MR Number: [id number]  Location: [facility] [address], [place] [id number] [place] [address] Dept: [phone]  Start: 10 AM End: 11 AM  Provider/Observer:     Edgardo Roys PSYD  Chief Complaint:      Chief Complaint  Patient presents with  . Depression  . Pain    Reason For Service:     The patient is a 61 year old Caucasian male that presented to the emergency department in September 12, 2014 with left-sided weakness and slurred speech. MRI of the brain showed a large acute tertiary right MCA tertiary infarct involving the right frontal lobe as well as remote infarcts of the left medial occipital lobe and left cerebellar hemisphere.  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 The patient was treated in the inpatient rehabilitation unit and was discharged with follow-up care with physical medicine and Dr. Letta Pate.  The patient has continued to have some significant adjustment issues and difficulties. Psychosocial stressors related to the relationship between the patient is wife as well as his stepson created complicated situation for him to cope with along with his physical limitations of left side hemiparesis. The patient was referred for neuropsychological interventions and we'll initially work on building coping skills around the adjustment issues.  Interventions Strategy:  Cognitive Behavioral interventions and coping skills building for recovery form CVA  Participation Level:   Active  Participation Quality:  Appropriate and Attentive      Behavioral Observation:  Well Groomed, Alert, and Appropriate.   Current Psychosocial Factors: The patient reports continued stress around inability to adapt to changes following 2016 stroke.    Content of Session:   Reveiwed current symptoms and worked on  Therapist, occupational  Current Status:   The patient reports that he has still had considerable gait issues and trouble coping post stroke.  Patient Progress:   Stable  Target Goals:   Working on Therapist, occupational due to medical issues following CVA  Last Reviewed:   12/31/2016  Goals Addressed Today:    Worked on coping and adaptive skills  Impression/Diagnosis:   The patient is status post MCA infarct of Right Hemisphere with near total loss of function in right arm and leg.  Patient denies significant change in cognitive function, but he was quite labile in his emotion that may or may not have an organic factor to it.  Will keep assessing that.  Diagnosis:   Spastic hemiparesis affecting nondominant side (HCC)  Adjustment disorder with depressed mood  Gait disturbance, post-stroke

## 2017-01-23 ENCOUNTER — Encounter (HOSPITAL_BASED_OUTPATIENT_CLINIC_OR_DEPARTMENT_OTHER): Payer: BLUE CROSS/BLUE SHIELD | Admitting: Psychology

## 2017-01-23 DIAGNOSIS — R269 Unspecified abnormalities of gait and mobility: Secondary | ICD-10-CM | POA: Diagnosis not present

## 2017-01-23 DIAGNOSIS — F4321 Adjustment disorder with depressed mood: Secondary | ICD-10-CM | POA: Diagnosis not present

## 2017-01-23 DIAGNOSIS — G8110 Spastic hemiplegia affecting unspecified side: Secondary | ICD-10-CM

## 2017-01-23 DIAGNOSIS — I69398 Other sequelae of cerebral infarction: Secondary | ICD-10-CM

## 2017-01-23 DIAGNOSIS — Z8673 Personal history of transient ischemic attack (TIA), and cerebral infarction without residual deficits: Secondary | ICD-10-CM | POA: Diagnosis not present

## 2017-01-23 DIAGNOSIS — I63511 Cerebral infarction due to unspecified occlusion or stenosis of right middle cerebral artery: Secondary | ICD-10-CM | POA: Diagnosis not present

## 2017-01-23 NOTE — Progress Notes (Signed)
Patient:  Brian Hall   DOB: Aug 20, 1956  MR Number: [id number]  Location: [facility] [address], [place] [id number] [place]  [address] Dept: [phone]  Start: 11 AM End: 12 PM  Provider/Observer:     Edgardo Roys PSYD  Chief Complaint:      Chief Complaint  Patient presents with  . Depression  . Pain  . Stress    Reason For Service:     The patient is a 61 year old Caucasian male that presented to the emergency department in September 12, 2014 with left-sided weakness and slurred speech. MRI of the brain showed a large acute tertiary right MCA tertiary infarct involving the right frontal lobe as well as remote infarcts of the left medial occipital lobe and left cerebellar hemisphere.  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 The patient was treated in the inpatient rehabilitation unit and was discharged with follow-up care with physical medicine and Dr. Letta Pate.  The patient has continued to have some significant adjustment issues and difficulties. Psychosocial stressors related to the relationship between the patient is wife as well as his stepson created complicated situation for him to cope with along with his physical limitations of left side hemiparesis. The patient was referred for neuropsychological interventions and we'll initially work on building coping skills around the adjustment issues.  Interventions Strategy:  Cognitive Behavioral interventions and coping skills building for recovery form CVA  Participation Level:   Active  Participation Quality:  Appropriate and Attentive      Behavioral Observation:  Well Groomed, Alert, and Appropriate.   Current Psychosocial Factors: The patient reports continued stress around inability to adapt to changes following 2016 stroke.    Content of Session:   Reveiwed current symptoms and  worked on Therapist, occupational  Current Status:   The patient reports that he has still had considerable gait issues and trouble coping post stroke.  Patient Progress:   Stable  Target Goals:   Working on Therapist, occupational due to medical issues following CVA  Last Reviewed:   01/23/2017  Goals Addressed Today:    Worked on coping and adaptive skills  Impression/Diagnosis:   The patient is status post MCA infarct of Right Hemisphere with near total loss of function in right arm and leg.  Patient denies significant change in cognitive function, but he was quite labile in his emotion that may or may not have an organic factor to it.  Will keep assessing that.  Diagnosis:   Spastic hemiparesis affecting nondominant side (HCC)  Adjustment disorder with depressed mood  Gait disturbance, post-stroke  History of ischemic right MCA stroke

## 2017-02-03 ENCOUNTER — Ambulatory Visit (HOSPITAL_BASED_OUTPATIENT_CLINIC_OR_DEPARTMENT_OTHER): Payer: BLUE CROSS/BLUE SHIELD | Admitting: Physical Medicine & Rehabilitation

## 2017-02-03 ENCOUNTER — Encounter: Payer: BLUE CROSS/BLUE SHIELD | Attending: Physical Medicine & Rehabilitation

## 2017-02-03 ENCOUNTER — Encounter: Payer: Self-pay | Admitting: Physical Medicine & Rehabilitation

## 2017-02-03 ENCOUNTER — Telehealth: Payer: Self-pay | Admitting: Neurology

## 2017-02-03 VITALS — BP 135/70 | HR 76

## 2017-02-03 DIAGNOSIS — G8110 Spastic hemiplegia affecting unspecified side: Secondary | ICD-10-CM | POA: Diagnosis not present

## 2017-02-03 DIAGNOSIS — I63511 Cerebral infarction due to unspecified occlusion or stenosis of right middle cerebral artery: Secondary | ICD-10-CM | POA: Insufficient documentation

## 2017-02-03 NOTE — Patient Instructions (Signed)

## 2017-02-03 NOTE — Progress Notes (Signed)
Botox Injection for spasticity using needle EMG guidance  Dilution: 50 Units/ml Indication: Severe spasticity which interferes with ADL,mobility and/or  hygiene and is unresponsive to medication management and other conservative care Informed consent was obtained after describing risks and benefits of the procedure with the patient. This includes bleeding, bruising, infection, excessive weakness, or medication side effects. A REMS form is on file and signed. Needle: 25g 2 " needle electrode Number of units per muscle Biceps 75 Brachialis 50 Brachioradialis 25 FCR 25 FDS 50 FDP 50. FPL 25 All injections were done after obtaining appropriate EMG activity and after negative drawback for blood. The patient tolerated the procedure well. Post procedure instructions were given. A followup appointment was made.

## 2017-02-03 NOTE — Telephone Encounter (Signed)
Rn call patients wife Brian Hall about her husband being weaker on one side. Also droop on one of his eyes and left mouth seems like its drooping a little. She stated pt was staying with a family member for over a month. Dr. Cyril Mourning maybe pt should see Dr. Leonie Man about his changes. Rn stated there was a cancellation for Wednesday at 0400pm. Pts wife schedule appt and will check in at 0330pm.

## 2017-02-03 NOTE — Telephone Encounter (Signed)
Patients wife [name] called the office in reference to patient seeing Dr. Letta Pate today.  Patients left eye is a little bit more droopy, left side of patients mouth has more of a droop to it,  patient is weak, patient is leaning more to the right and can not up right due to losing balance.  Dr. Letta Pate advised patient wife to contact Dr. Clydene Fake office to see what he recommends and if he needs to be seen sooner.  Please call

## 2017-02-05 ENCOUNTER — Encounter: Payer: Self-pay | Admitting: Neurology

## 2017-02-05 ENCOUNTER — Ambulatory Visit (INDEPENDENT_AMBULATORY_CARE_PROVIDER_SITE_OTHER): Payer: BLUE CROSS/BLUE SHIELD | Admitting: Neurology

## 2017-02-05 VITALS — BP 136/84 | HR 75 | Ht 71.0 in | Wt 250.0 lb

## 2017-02-05 DIAGNOSIS — G8110 Spastic hemiplegia affecting unspecified side: Secondary | ICD-10-CM

## 2017-02-05 DIAGNOSIS — G8194 Hemiplegia, unspecified affecting left nondominant side: Secondary | ICD-10-CM | POA: Diagnosis not present
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
## 2017-02-05 NOTE — Patient Instructions (Addendum)
I had a long discussion with the patient and his wife regarding his recent worsening of left-sided weakness and gait imbalance which is often clear etiology but certainly evaluation for interval new stroke should be done. I recommend checking MRI scan of the brain and MRA of the brain and neck and referral for outpatient physical and occupational therapy for gait and balance training. Continue Plavix for stroke prevention and strict control of hypertension with blood pressure goal below 130/90, lipids with LDL cholesterol goal below 70 mg percent and diabetes with him about an A1c goal below 6.5%. I also encouraged him to eat a healthy diet with lots of fruits, vegetables, whole grains and cereals and   to be active. He'll return for follow-up in 6 months with nurse practitioner call earlier if necessary`

## 2017-02-06 ENCOUNTER — Other Ambulatory Visit: Payer: Self-pay | Admitting: Family Medicine

## 2017-02-10 ENCOUNTER — Encounter: Payer: Self-pay | Admitting: Occupational Therapy

## 2017-02-10 ENCOUNTER — Ambulatory Visit: Payer: BLUE CROSS/BLUE SHIELD | Attending: Neurology | Admitting: Occupational Therapy

## 2017-02-10 ENCOUNTER — Ambulatory Visit: Payer: BLUE CROSS/BLUE SHIELD | Admitting: Physical Therapy

## 2017-02-10 DIAGNOSIS — G8114 Spastic hemiplegia affecting left nondominant side: Secondary | ICD-10-CM | POA: Diagnosis present

## 2017-02-10 DIAGNOSIS — R2681 Unsteadiness on feet: Secondary | ICD-10-CM | POA: Insufficient documentation

## 2017-02-10 DIAGNOSIS — R293 Abnormal posture: Secondary | ICD-10-CM | POA: Diagnosis present

## 2017-02-10 DIAGNOSIS — M6281 Muscle weakness (generalized): Secondary | ICD-10-CM

## 2017-02-10 DIAGNOSIS — R2689 Other abnormalities of gait and mobility: Secondary | ICD-10-CM | POA: Diagnosis present

## 2017-02-10 NOTE — Therapy (Signed)
[facility] [address] [place], [place], [address] Phone: [phone]   Fax:  [phone]  Occupational Therapy Evaluation  Patient Details  Name: Brian Hall MRN: [id number] Date of Birth: 1956-02-16 Referring Provider: Dr. Leonie Man  Encounter Date: 02/10/2017      OT End of Session - 02/10/17 1339    Visit Number 1   Number of Visits 1   Date for OT Re-Evaluation --  n/a   Authorization Type [employer]   OT Start Time 1232   OT Stop Time 1302   OT Time Calculation (min) 30 min      Past Medical History:  Diagnosis Date  . Blurred vision   . Cardiac LV ejection fraction 10-20%   . Cardiogenic shock ([place])   . CHF (congestive heart failure) ([place])   . Diabetes mellitus   . 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 Hyperkalemia   . Hyperlipemia   . Hypertension   . Hypothyroidism   . Joint pain   . Joint swelling   . Obesity   . Renal insufficiency   . SOB (shortness of breath)   . Stroke ([place]) 09/12/2014  . Throat cancer ([place]) 2011   s/p neck dissection, radiation, chemo    Past Surgical History:  Procedure Laterality Date  . CARDIAC CATHETERIZATION    . NECK DISSECTION Right 2011   s/p resection of throat cancer with multiple nodes removed    There were no vitals filed for this visit.      Subjective Assessment - 02/10/17 1241    Patient is accompained by: Family member  wife   Currently in Pain? Yes   Pain Score 7    Pain Location Hip   Pain Orientation Left   Pain Descriptors / Indicators Aching   Pain Type Chronic pain   Pain Onset More than a month ago   Pain Frequency Intermittent   Aggravating Factors  walking on walker   Pain Relieving Factors sitting sometimes, sometimes sitting makes it worse.  Sometimes the pain meds can help.           [facility] OT Assessment - 02/10/17 0001      Assessment   Diagnosis L hemiplegia   pt with R CVA on 09/12/2014   Referring  Provider Dr. Leonie Man   Onset Date 12/31/16  pt/wife noticed increased weakness/decline   Assessment MD completing MRI to see if pt's has had another stroke or if this is decline.    Prior Therapy pt has had extensive inpt and outpatient PT, OT and ST     Precautions   Precautions Fall     Restrictions   Weight Bearing Restrictions No     Balance Screen   Has the patient fallen in the past 6 months Yes  2 - pt to see PT for eval today   How many times? 2     [place] expects to be discharged to: Private residence   Living Arrangements Spouse/significant other   Type of [place] home  triple wide   Home Access Ramped entrance   [place]  3 in 1 over toilet   Additional Comments transfers tub bench     Prior Function   Level of [place]  prior to stroke    Vocation On disability   Leisure Pt has needed some assistance for ADL's and mobility since stroke.  ADL   Eating/Feeding Modified independent   Grooming Modified independent   Upper Body Bathing Modified independent   Lower Body Bathing Minimal assistance  for socks and shoes; wife reports pt won't   Upper Body Dressing --  mod I   Lower Body Dressing Modified independent   Toilet Transfer Modified independent   Toileting - Clothing Manipulation Modified independent   Toileting -  Hygiene Modified Independent   Tub/Shower Transfer Supervision/safety   ADL comments Wife and pt both report that pt is ABLE to complete basic self care tasks and basic functional mobility but chooses not to - pt stated "I am tired and it is easier if she does it for me."  Dicussed that if pt wishes to maintain gains he achieves in therapy he will need to carry over and be as independent as possible at home. Pt verbalized understanding.  Wife is strongly encouraging independence as much as she can.      IADL   Shopping Needs to be accompanied  on any shopping trip   Light Housekeeping Performs light daily tasks but cannot maintain acceptable level of cleanliness   Meal Prep Able to complete simple cold meal and snack prep   Medication Management Takes responsibility if medication is prepared in advance in seperate dosage   Financial Management Requires assistance     Mobility   Mobility Status Needs assist     Written Expression   Dominant Hand Right     Cognition   Overall Cognitive Status History of cognitive impairments - at baseline     Sensation   Light Touch Impaired by gross assessment   Hot/Cold Appears Intact   Proprioception Impaired by gross assessment     Coordination   Gross Motor Movements are Fluid and Coordinated No   Fine Motor Movements are Fluid and Coordinated No     Perception   Perception Impaired     Praxis   Praxis Impaired     Tone   Assessment Location Left Upper Extremity     ROM / Strength   AROM / PROM / Strength Strength;PROM;AROM     AROM   Overall AROM  Deficits   Overall AROM Comments pt with very limited isolated movement in LUE -pt with [facility].  Pt and wife have home program for LUE. Wife states they do the program but pt does not attempt to use his LUE at all functionally. Dicussed with pt importance of using LUE as much as possible as stabilizer - pt verbalized understanding.      PROM   Overall PROM  Deficits   Overall PROM Comments see discharged status from prior OT - pt at baseline      Strength   Overall Strength Unable to assess   Overall Strength Comments due to spasticity however pt has very poor isolated movement in LUE.      Hand Function   Right Hand Gross Grasp Functional   Left Hand Gross Grasp Impaired   Comment Pt has no functional use of L hand.      LUE Tone   LUE Tone Moderate  pt currently having repeat botox injections                           OT Short Term Goals - 02/10/17 1333      OT SHORT TERM GOAL #1    Title n/a  OT Long Term Goals - 02/10/17 1333      OT LONG TERM GOAL #1   Title n/a               Plan - 02/10/17 1333    Clinical Impression Statement Pt is a 61 year old male s/p R MCA CVA on 09/12/2014 and well known to this clinic.  Pt returns today as MD is trying to determine new small stroke vs decline due to debility (MD with stroke workup).  Pt is currently able to do basic self care and basic functional mobility however is choosing not to do it.  Pt is more deconditioned and balance more impaired likely due to deconditioning - pt to see PT today. Given that pt is capable of completing basic ADL's and that LUE is at baseline from when he was last discharged from OT, OT is not indicated at this time.  See PT eval.    Occupational Profile and client history currently impacting functional performance PMH: HTN, HLD, obesity, CHF, cardiac issues, h/o of stroke, kidney disease.   Plan No OT indicated at this time. PT to evaluated today for balance and mobility   Consulted and Agree with Plan of Care Patient;Family member/caregiver   Family Member Consulted wife      Patient will benefit from skilled therapeutic intervention in order to improve the following deficits and impairments:     Visit Diagnosis: Spastic hemiparesis of left nondominant side [facility])    Problem List Patient Active Problem List   Diagnosis Date Noted  . Adjustment reaction with aggression   . 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 Gait disturbance, post-stroke 07/30/2016  . 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 Emotional lability 04/22/2016  . CKD (chronic kidney disease) 04/01/2016  . Spastic hemiplegia affecting nondominant side ([place]) 02/26/2016  . Hyperlipemia 01/17/2016  . BPH (benign prostatic hyperplasia) 10/16/2015  . 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 Hemiparesis affecting left side as late effect of cerebrovascular  accident (CVA) ([place]) 11/08/2014  . Hypothyroidism 10/27/2014  . Right carotid artery occlusion   . Essential hypertension 09/12/2014  . Type 2 diabetes mellitus with peripheral neuropathy ([place]) 09/12/2014    Quay Burow, OTR/L 02/10/2017, 1:41 PM  [facility] [address] [place], [place], [address] Phone: [phone]   Fax:  [phone]  Name: Brian Hall MRN: [id number] Date of Birth: Jul 23, 1956

## 2017-02-10 NOTE — Therapy (Signed)
[facility] [address] [place], [place], [address] Phone: [phone]   Fax:  [phone]  Physical Therapy Evaluation  Patient Details  Name: Brian Hall MRN: [id number] Date of Birth: 12/22/1955 Referring Provider: Antony Contras  Encounter Date: 02/10/2017      PT End of Session - 02/10/17 2012    Visit Number 1  New episode of care 02/10/17   Number of Visits 10   Date for PT Re-Evaluation 04/11/17   Authorization Type BCBS -previous note of 30 visit limit combined with PT/OT (29 were left prior to PT/OT eval 02/10/17)   PT Start Time 1407   PT Stop Time 1445   PT Time Calculation (min) 38 min   Equipment Utilized During Treatment Gait belt   Activity Tolerance Patient tolerated treatment well   Behavior During Therapy WFL for tasks assessed/performed      Past Medical History:  Diagnosis Date  . Blurred vision   . Cardiac LV ejection fraction 10-20%   . Cardiogenic shock ([place])   . CHF (congestive heart failure) ([place])   . Diabetes mellitus   . 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 Hyperkalemia   . Hyperlipemia   . Hypertension   . Hypothyroidism   . Joint pain   . Joint swelling   . Obesity   . Renal insufficiency   . SOB (shortness of breath)   . Stroke ([place]) 09/12/2014  . Throat cancer ([place]) 2011   s/p neck dissection, radiation, chemo    Past Surgical History:  Procedure Laterality Date  . CARDIAC CATHETERIZATION    . NECK DISSECTION Right 2011   s/p resection of throat cancer with multiple nodes removed    There were no vitals filed for this visit.       Subjective Assessment - 02/10/17 1410    Subjective Unusre if he had another CVA.  Patient thinks its because he hasn't been doing his exercises.  Pt feels he is weaker since being at PT earlier this year.  Wife reports he wasn't using the walker as much as using the wheelchair.  Feels like he may fall.  Pt did have one  fall (12/15/16) when he stood up quickly and spun around.   Patient is accompained by: Family member   Patient Stated Goals Pt's goal for therapy is to start back doing what I'm supposed to.   Currently in Pain? Yes   Pain Score 6    Pain Location Hip  pt points to SI area on L   Pain Orientation Left   Pain Descriptors / Indicators Aching   Pain Type Chronic pain   Pain Radiating Towards into hip area   Pain Onset More than a month ago   Aggravating Factors  walking too long aggravates pain   Pain Relieving Factors sitting alleviates pain   Effect of Pain on Daily Activities PT will monitor pain, will attempt to address through positioning/exercise instruction            [facility] PT Assessment - 02/10/17 1416      Assessment   Medical Diagnosis L hemplegia following CVA   Referring Provider Antony Contras   Onset Date/Surgical Date 09/11/14  (CVA); 02/05/17 MD visit     Precautions   Precautions Fall   Precaution Comments Wears L AFO     Restrictions   Weight Bearing Restrictions No     Balance Screen   Has the patient fallen in the  past 6 months Yes   How many times? 1   Has the patient had a decrease in activity level because of a fear of falling?  Yes   Is the patient reluctant to leave their home because of a fear of falling?  Yes     [place] residence   Living Arrangements Spouse/significant other   Available Help at Discharge Family;Available 24 hours/day   Type of [place] One level   [place] - 2 wheels;Bedside commode;Tub bench  Hemi-walker; has w/c, but wife doesn't want w/c in home     Prior Function   Level of Independence Independent  prior to stroke   Vocation On disability   Leisure Pt has needed some assistance for ADL's and mobility since stroke. Enjoyed riding motorcycles.     Cognition   Overall Cognitive Status History of cognitive impairments - at  baseline     Observation/Other Assessments   Focus on Therapeutic Outcomes (FOTO)  NA     Posture/Postural Control   Posture/Postural Control Postural limitations   Posture Comments without AFO, tends to hold L foot in supination and inversion with wife reporting toes curled     Tone   Assessment Location Left Lower Extremity     ROM / Strength   AROM / PROM / Strength Strength     PROM   Overall PROM  Deficits   Overall PROM Comments tightness noted in L hamstrings     Strength   Overall Strength Deficits   Strength Assessment Site Hip;Knee   Right/Left Hip Right;Left   Right Hip Flexion 5/5   Left Hip Flexion 3+/5   Right/Left Knee Right;Left   Right Knee Flexion 5/5   Right Knee Extension 5/5   Left Knee Flexion 3-/5   Left Knee Extension 3-/5     Transfers   Transfers Sit to Stand;Stand to Sit   Sit to Stand 6: Modified independent (Device/Increase time);With upper extremity assist;From chair/3-in-1   Stand to Sit 6: Modified independent (Device/Increase time);With upper extremity assist;To chair/3-in-1   Comments Pt needs extra time, but is able to place L hand in LUE support on RW upon standing and take L hand out upon sitting.     Ambulation/Gait   Ambulation/Gait Yes   Ambulation/Gait Assistance 4: Min guard   Ambulation Distance (Feet) 110 Feet   Assistive device Rolling walker   Gait Pattern Step-to pattern;Step-through pattern;Decreased stride length;Trunk rotated posteriorly on left;Decreased weight shift to left;Lateral hip instability;Trunk flexed   Ambulation Surface Level;Indoor   Gait velocity 70.03 sec in 32.8 ft = 0.46 ft/sec   Gait Comments Pt ambulates 75 ft in 3 minute walk test.     Standardized Balance Assessment   Standardized Balance Assessment Timed Up and Go Test     Timed Up and Go Test   Normal TUG (seconds) 83  therapist assist with hand strap on UE support of RW   TUG Comments >13.5 seconds indicates increased fall risk; >30 seconds  indicates difficulty with ADLs in the home.     LUE Tone   LUE Tone Moderate            Objective measurements completed on examination: See above findings.                  PT Education - 02/10/17 2010    Education provided Yes   Education Details Discussed  objective findings in PT eval; discussed POC; had frank discussion regarding pt's participation not only in therapy, but in HEP and gait activities at home; plan for 4 week trial for PT based on pt's participation.   Person(s) Educated Patient;Spouse   Methods Explanation   Comprehension Verbalized understanding          PT Short Term Goals - 02/10/17 2032      PT SHORT TERM GOAL #1   Title Same as LTGs = 5 weeks           PT Long Term Goals - 02/10/17 2027      PT LONG TERM GOAL #1   Title Pt will be independent with HEP to improve functional mobility, balance and gait.  TARGET 03/14/17   Time 5   Period Weeks   Status New     PT LONG TERM GOAL #2   Title Pt will improve gait velocity to at least 0.8 ft/sec for decreased fall risk, improved efficiency of gait in the home.   Time 5   Period Weeks   Status New     PT LONG TERM GOAL #3   Title Pt will report no more than 4/10 pain (at worst) in back and LLE during gait in order to indicate that pain is decreased limiting factor in mobility.     Time 5   Period Weeks   Status New     PT LONG TERM GOAL #4   Title Pt will improve gait in 3 minute walk test to at least 100 ft for improved gait efficiency and safety in home environment.   Time 5   Period Weeks   Status New                Plan - 02/10/17 2015    Clinical Impression Statement Pt presents to OP PT with history of CVA, L hemiplegia, with decreased functional mobility, decreased balance, decreased gait independence, increased fear of falling.  Pt has decreased strength, ROM in LLE.  Pt previously known to this clinic, and was last seen in early January 2018.  He has not  ambulated in home, with use of wheelchair more than ambulating due to pt's reported fear of falling and just not doing what he's supposed to do.  Pt presents today with desire to follow PT recommendations and to improve walking ability.  Pt is at fall risk per significantly reduced gait velocity and TUG scores.  Pt would benefit from skilled PT to address the above stated deficits to improve functional mobility and decrease fall risk.   History and Personal Factors relevant to plan of care: >3 co-morbidities including anger, depresson/personality changes following CVA   Clinical Presentation Evolving   Clinical Presentation due to: Following pt living with sister for short period of time between April-May, wife notes increased L weakness, more difficulty transferring, questions new CVA-pt to have MRI of brain per Dr. Leonie Man   Clinical Decision Making Moderate   Rehab Potential Good   Clinical Impairments Affecting Rehab Potential chronicity of deficits, pt motivation   PT Frequency 2x / week   PT Duration 4 weeks  plus 1x/wk following eval   PT Treatment/Interventions ADLs/Self Care Home Management;Functional mobility training;Gait training;DME Instruction;Neuromuscular re-education;Patient/family education;Orthotic Fit/Training;Balance training;Therapeutic exercise;Therapeutic activities;Passive range of motion   PT Next Visit Plan Work on updating HEP, gait with RW; work on lower extremity stretching and strengthening; standing balance   Consulted and Agree with Plan of Care Patient;Family member/caregiver  Family Member Consulted wife      Patient will benefit from skilled therapeutic intervention in order to improve the following deficits and impairments:  Abnormal gait, Decreased activity tolerance, Decreased balance, Decreased mobility, Decreased coordination, Decreased range of motion, Decreased safety awareness, Difficulty walking, Decreased strength, Impaired flexibility, Postural  dysfunction, Pain  Visit Diagnosis: Other abnormalities of gait and mobility  Muscle weakness (generalized)  Unsteadiness on feet  Abnormal posture     Problem List Patient Active Problem List   Diagnosis Date Noted  . Adjustment reaction with aggression   . 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 Gait disturbance, post-stroke 07/30/2016  . 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 Emotional lability 04/22/2016  . CKD (chronic kidney disease) 04/01/2016  . Spastic hemiplegia affecting nondominant side ([place]) 02/26/2016  . Hyperlipemia 01/17/2016  . BPH (benign prostatic hyperplasia) 10/16/2015  . 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 Hemiparesis affecting left side as late effect of cerebrovascular accident (CVA) ([place]) 11/08/2014  . Hypothyroidism 10/27/2014  . Right carotid artery occlusion   . Essential hypertension 09/12/2014  . Type 2 diabetes mellitus with peripheral neuropathy ([place]) 09/12/2014    MARRIOTT,AMY W. 02/10/2017, 8:32 PM  Frazier Butt., PT   [facility] [address] [place], [place], [address] Phone: [phone]   Fax:  [phone]  Name: KHYRI HINZMAN MRN: [id number] Date of Birth: November 24, 1955

## 2017-02-12 ENCOUNTER — Ambulatory Visit: Payer: BLUE CROSS/BLUE SHIELD

## 2017-02-12 ENCOUNTER — Encounter (HOSPITAL_BASED_OUTPATIENT_CLINIC_OR_DEPARTMENT_OTHER): Payer: BLUE CROSS/BLUE SHIELD | Admitting: Psychology

## 2017-02-12 ENCOUNTER — Telehealth: Payer: Self-pay | Admitting: Neurology

## 2017-02-12 DIAGNOSIS — G8110 Spastic hemiplegia affecting unspecified side: Secondary | ICD-10-CM | POA: Diagnosis not present

## 2017-02-12 DIAGNOSIS — G8194 Hemiplegia, unspecified affecting left nondominant side: Secondary | ICD-10-CM

## 2017-02-12 DIAGNOSIS — F4321 Adjustment disorder with depressed mood: Secondary | ICD-10-CM | POA: Diagnosis not present

## 2017-02-12 DIAGNOSIS — I63511 Cerebral infarction due to unspecified occlusion or stenosis of right middle cerebral artery: Secondary | ICD-10-CM | POA: Diagnosis not present

## 2017-02-12 NOTE — Telephone Encounter (Signed)
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 When you get a chance can you put 3 new orders in for MR Brain wo contrast, MRA Head wo contrast & MRA Neck wo contrast. He was scheduled to have it here but he could not have it here and needs to be scheduled at [facility]. When I contacted [facility] they were not able to schedule it since I used the accession number and they need a new order.

## 2017-02-12 NOTE — Telephone Encounter (Signed)
Imaging reordered/fim

## 2017-02-12 NOTE — Telephone Encounter (Signed)
Noted thank you

## 2017-02-14 ENCOUNTER — Ambulatory Visit: Payer: Self-pay | Admitting: Physical Therapy

## 2017-02-14 ENCOUNTER — Ambulatory Visit: Payer: BLUE CROSS/BLUE SHIELD | Admitting: Physical Therapy

## 2017-02-14 DIAGNOSIS — R293 Abnormal posture: Secondary | ICD-10-CM

## 2017-02-14 DIAGNOSIS — R2689 Other abnormalities of gait and mobility: Secondary | ICD-10-CM

## 2017-02-14 DIAGNOSIS — G8114 Spastic hemiplegia affecting left nondominant side: Secondary | ICD-10-CM | POA: Diagnosis not present

## 2017-02-14 NOTE — Patient Instructions (Addendum)
PELVIC TILT: Anterior    You will want to scoot towards the edge of the chair.  Place both hands in your lap.  Start in slumped position. Roll pelvis forward to arch back and sit up tall. Hold for 5 seconds, _10__ reps per set, __2_ sets.   Copyright  VHI. All rights reserved.  PELVIC TILT: Lateral    Scoot out towards the edge of the chair.  Place both hands on your lap.  Shift weight to one side. Raise opposite hip from surface.  Repeat to the other side __10_ reps per set, _2__ sets   Copyright  VHI. All rights reserved.  Adduction: Hip - Knees Together (Sitting)    Sit with towel roll or ballbetween knees. Push knees together. Hold for _3__ seconds. Rest for _3__ seconds. Repeat __2 sets of 10_ times. Do __1-2_ times a day.  Copyright  VHI. All rights reserved.    Try to sit in your dining room chair several times during the day-in your best posture, with a towel behind your back if needed.  Set a goal to set 3-5 minutes at a time.  When doing the work of exercise (the hardest part)-you will want to breathe out, then breathe in during the easy part of the exercise.

## 2017-02-14 NOTE — Therapy (Signed)
[facility] [address] [place], [place], [address] Phone: [phone]   Fax:  [phone]  Physical Therapy Treatment  Patient Details  Name: Brian Hall MRN: [id number] Date of Birth: August 07, 1956 Referring Provider: Antony Contras  Encounter Date: 02/14/2017      PT End of Session - 02/14/17 1222    Visit Number 2  New episode of care 02/10/17   Number of Visits 10   Date for PT Re-Evaluation 04/11/17   Authorization Type BCBS -previous note of 30 visit limit combined with PT/OT (29 were left prior to PT/OT eval 02/10/17)   Authorization - Visit Number 2   Authorization - Number of Visits 29  PT requested clarification from Laverda Page 02/14/17   PT Start Time 0930   PT Stop Time 1017   PT Time Calculation (min) 47 min   Equipment Utilized During Treatment Gait belt   Activity Tolerance Patient tolerated treatment well   Behavior During Therapy WFL for tasks assessed/performed      Past Medical History:  Diagnosis Date  . Blurred vision   . Cardiac LV ejection fraction 10-20%   . Cardiogenic shock ([place])   . CHF (congestive heart failure) ([place])   . Diabetes mellitus   . 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 Hyperkalemia   . Hyperlipemia   . Hypertension   . Hypothyroidism   . Joint pain   . Joint swelling   . Obesity   . Renal insufficiency   . SOB (shortness of breath)   . Stroke ([place]) 09/12/2014  . Throat cancer ([place]) 2011   s/p neck dissection, radiation, chemo    Past Surgical History:  Procedure Laterality Date  . CARDIAC CATHETERIZATION    . NECK DISSECTION Right 2011   s/p resection of throat cancer with multiple nodes removed    There were no vitals filed for this visit.      Subjective Assessment - 02/14/17 0932    Subjective Nothing new.  I could be better.  When I am sitting upright, I feel like I am falling and need to hold on with my R hand.   Patient is accompained  by: Family member   Patient Stated Goals Pt's goal for therapy is to start back doing what I'm supposed to.   Currently in Pain? Yes   Pain Score 7    Pain Location Hip   Pain Orientation Left   Pain Descriptors / Indicators Aching   Pain Type Chronic pain   Pain Onset More than a month ago   Pain Frequency Intermittent   Aggravating Factors  mostly walking   Pain Relieving Factors sitting alleviates pain; medications alleviate pain          Neuro Re-education: Seated at edge of mat:  Anterior/posterior pelvic tilt 2 sets x 10 reps with tactile cues facilitating at lumbar spine and scapular retraction -Lateral pelvic tilt, 2 sets x 10 reps with tactile cues facilitating at lateral trunk; verbal cues to correct upright posture between exercises -Seated postural re-education, with upright posture sitting at edge of mat, multiple reps during session 2-3 minutes at a time.  By session's end, pt is able to self correct posture when he returns to posterior pelvic tilt or when he returns to increased R lateral lean.  By session's end, pt able to self-correct posture by not using RUE to increase lean to R. -In upright posture, seated rhythmic stabilization in anterior/posterior direction with resistance  given at shoulders, x 8 reps, 3 second hold for improved trunk stability.  Pt needs occasional cues to reset upright posture. -Seated hip adductor squeezes with ball, 2 sets x 10 reps for improved sitting posture/position of LLE -Seated knee flexion (LLE on sliding board covered with pillow), x 12 reps (with cues for breathing technique) and therapist assist to guide into neutral hip position (decreasing hip external rotation)  -Discussed positioning with sitting at home, and encouraged patient to sit upright in dining room chair (with pillow support at lumbar spine) if necessary, several times during the day to encourage improved posture and tolerance for upright sitting (to encourage less time in  flexed and lateral lean position in recliner)                [facility] Adult PT Treatment/Exercise - 02/14/17 0001      Transfers   Transfers Sit to Stand;Stand to Sit   Sit to Stand 4: Min assist;Without upper extremity assist;From bed   Sit to Stand Details Tactile cues for initiation;Tactile cues for sequencing;Tactile cues for weight shifting;Tactile cues for posture  to increased weightshift to L/to increase weightbearing LLE   Stand to Sit 4: Min assist;Without upper extremity assist;To bed  for equal weightbearing   Number of Reps --  8   Comments With attempts at equal weightbearing, pt requires additional verbal and tactile cues.  Pt is able to perform sit<>stand modified independently, but with significant lean to R     Ambulation/Gait   Ambulation/Gait Yes   Ambulation/Gait Assistance 4: Min guard   Ambulation/Gait Assistance Details Verbal cues for patient to initiate L hip flexion to increase foot clearance   Ambulation Distance (Feet) 30 Feet   Assistive device Rolling walker   Gait Pattern Step-through pattern;Decreased stride length;Trunk rotated posteriorly on left;Decreased weight shift to left;Lateral hip instability;Trunk flexed   Ambulation Surface Level;Indoor                PT Education - 02/14/17 1221    Education provided Yes   Education Details HEP initiated-see instructions   Person(s) Educated Patient;Spouse   Methods Explanation;Demonstration;Handout   Comprehension Verbalized understanding;Returned demonstration          PT Short Term Goals - 02/10/17 2032      PT SHORT TERM GOAL #1   Title Same as LTGs = 5 weeks           PT Long Term Goals - 02/10/17 2027      PT LONG TERM GOAL #1   Title Pt will be independent with HEP to improve functional mobility, balance and gait.  TARGET 03/14/17   Time 5   Period Weeks   Status New     PT LONG TERM GOAL #2   Title Pt will improve gait velocity to at least 0.8 ft/sec for  decreased fall risk, improved efficiency of gait in the home.   Time 5   Period Weeks   Status New     PT LONG TERM GOAL #3   Title Pt will report no more than 4/10 pain (at worst) in back and LLE during gait in order to indicate that pain is decreased limiting factor in mobility.     Time 5   Period Weeks   Status New     PT LONG TERM GOAL #4   Title Pt will improve gait in 3 minute walk test to at least 100 ft for improved gait efficiency and safety in home environment.  Time 5   Period Weeks   Status New               Plan - 02/14/17 1227    Clinical Impression Statement Skilled PT session focused this visit on sit to stand transfers, trunk strength/core stability with improved sitting posture.  Pt participates well in therapy session today, and demonstrates understanding of HEP at the end of the session.  Educated patient on importance of sitting in varied chairs in home, not just the recliner, in order to build trunk strength and improve posture/sitting balance comfort.   Rehab Potential Good   Clinical Impairments Affecting Rehab Potential chronicity of deficits, pt motivation   PT Frequency 2x / week   PT Duration 4 weeks  plus 1x/wk following eval   PT Treatment/Interventions ADLs/Self Care Home Management;Functional mobility training;Gait training;DME Instruction;Neuromuscular re-education;Patient/family education;Orthotic Fit/Training;Balance training;Therapeutic exercise;Therapeutic activities;Passive range of motion   PT Next Visit Plan Review HEP given this visit; lower extremity stretch/strengthening, standing balance and gait with RW (pt may need new comfort grip on hand grip of walker due to excess hand sweating)   Consulted and Agree with Plan of Care Patient;Family member/caregiver   Family Member Consulted wife      Patient will benefit from skilled therapeutic intervention in order to improve the following deficits and impairments:  Abnormal gait,  Decreased activity tolerance, Decreased balance, Decreased mobility, Decreased coordination, Decreased range of motion, Decreased safety awareness, Difficulty walking, Decreased strength, Impaired flexibility, Postural dysfunction, Pain  Visit Diagnosis: Abnormal posture  Other abnormalities of gait and mobility     Problem List Patient Active Problem List   Diagnosis Date Noted  . Adjustment reaction with aggression   . 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 Gait disturbance, post-stroke 07/30/2016  . 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 Emotional lability 04/22/2016  . CKD (chronic kidney disease) 04/01/2016  . Spastic hemiplegia affecting nondominant side ([place]) 02/26/2016  . Hyperlipemia 01/17/2016  . BPH (benign prostatic hyperplasia) 10/16/2015  . 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 Hemiparesis affecting left side as late effect of cerebrovascular accident (CVA) ([place]) 11/08/2014  . Hypothyroidism 10/27/2014  . Right carotid artery occlusion   . Essential hypertension 09/12/2014  . Type 2 diabetes mellitus with peripheral neuropathy ([place]) 09/12/2014    Joleena Weisenburger W. 02/14/2017, 12:31 PM  Frazier Butt., PT  [facility] [address] [place], [place], [address] Phone: [phone]   Fax:  [phone]  Name: ISHAAQ PENNA MRN: [id number] Date of Birth: Dec 31, 1955

## 2017-02-17 ENCOUNTER — Encounter: Payer: Self-pay | Admitting: Family Medicine

## 2017-02-17 ENCOUNTER — Ambulatory Visit: Payer: BLUE CROSS/BLUE SHIELD | Admitting: Rehabilitation

## 2017-02-17 ENCOUNTER — Ambulatory Visit (INDEPENDENT_AMBULATORY_CARE_PROVIDER_SITE_OTHER): Payer: BLUE CROSS/BLUE SHIELD | Admitting: Family Medicine

## 2017-02-17 ENCOUNTER — Encounter: Payer: Self-pay | Admitting: Rehabilitation

## 2017-02-17 VITALS — BP 90/54 | HR 61 | Temp 97.1°F | Ht 71.0 in | Wt 246.0 lb

## 2017-02-17 DIAGNOSIS — I69354 Hemiplegia and hemiparesis following cerebral infarction affecting left non-dominant side: Secondary | ICD-10-CM | POA: Diagnosis not present

## 2017-02-17 DIAGNOSIS — R2681 Unsteadiness on feet: Secondary | ICD-10-CM

## 2017-02-17 DIAGNOSIS — I69398 Other sequelae of cerebral infarction: Secondary | ICD-10-CM | POA: Diagnosis not present

## 2017-02-17 DIAGNOSIS — R293 Abnormal posture: Secondary | ICD-10-CM

## 2017-02-17 DIAGNOSIS — G8114 Spastic hemiplegia affecting left nondominant side: Secondary | ICD-10-CM | POA: Diagnosis not present

## 2017-02-17 DIAGNOSIS — G8110 Spastic hemiplegia affecting unspecified side: Secondary | ICD-10-CM

## 2017-02-17 DIAGNOSIS — R2689 Other abnormalities of gait and mobility: Secondary | ICD-10-CM

## 2017-02-17 DIAGNOSIS — F0631 Mood disorder due to known physiological condition with depressive features: Secondary | ICD-10-CM

## 2017-02-17 DIAGNOSIS — M6281 Muscle weakness (generalized): Secondary | ICD-10-CM

## 2017-02-17 DIAGNOSIS — F4329 Adjustment disorder with other symptoms: Secondary | ICD-10-CM

## 2017-02-17 MED ORDER — BUPROPION HCL ER (XL) 150 MG PO TB24: ORAL_TABLET | ORAL | 1 refills | Status: DC

## 2017-02-17 NOTE — Progress Notes (Signed)
Subjective:  Patient ID: Brian Hall, male    DOB: February 17, 1956  Age: 61 y.o. MRN: [id number]  CC: Follow-up (pt here today for 6 week recheck after increasing his Abilify to 15mg )   HPI Brian Hall presents for Very tired today. He is still angry and argumentative with his wife but also feeling rather dazed and drugged. He and his wife clash because he feels he can't do anything. He is very frustrated by being wheelchair-bound and has become rather dependent and wants her to do everything even though therapists tell her to make him do things for himself.   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   He has a past surgical history that includes Cardiac catheterization and Neck dissection (Right, 2011).   His family history includes Arthritis/Rheumatoid in his brother; Hypertension in his father and mother; Stroke in his father and sister.He reports that he has never smoked. He has never used smokeless tobacco. He reports that he does not drink alcohol or use drugs.    ROS Review of Systems  Constitutional: Negative for chills, diaphoresis and fever.  HENT: Negative for rhinorrhea and sore throat.   Respiratory: Negative for cough and shortness of breath.   Cardiovascular: Negative for chest pain.  Gastrointestinal: Negative for abdominal pain.  Musculoskeletal: Positive for arthralgias, gait problem and myalgias.  Skin: Negative for rash.  Neurological: Negative for weakness and headaches.    Objective:  BP (!) 90/54   Pulse 61   Temp 97.1 F (36.2 C) (Oral)   Ht 5\' 11"  (1.803 m)   Wt 246  lb (111.6 kg)   BMI 34.31 kg/m   BP Readings from Last 3 Encounters:  02/17/17 (!) 90/54  02/05/17 136/84  02/03/17 135/70    Wt Readings from Last 3 Encounters:  02/17/17 246 lb (111.6 kg)  02/05/17 250 lb (113.4 kg)  01/06/17 259 lb (117.5 kg)     Physical Exam  Constitutional: He appears well-developed and well-nourished.  HENT:  Head: Normocephalic and atraumatic.  Right Ear: Tympanic membrane and external ear normal. No decreased hearing is noted.  Left Ear: Tympanic membrane and external ear normal. No decreased hearing is noted.  Mouth/Throat: No oropharyngeal exudate or posterior oropharyngeal erythema.  Eyes: Pupils are equal, round, and reactive to light.  Neck: Normal range of motion. Neck supple.  Cardiovascular: Normal rate and regular rhythm.   No murmur heard. Pulmonary/Chest: Breath sounds normal. No respiratory distress.  Abdominal: Soft. Bowel sounds are normal. He exhibits no mass. There is no tenderness.  Musculoskeletal:  Wheelchair-bound with left sided weakness and incomplete contractures of the left upper extremity  Vitals reviewed.     Assessment & Plan:   Brian Hall was seen today for follow-up.  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 I am also having  him maintain his multivitamin with minerals, cholecalciferol, Naproxen Sodium, lisinopril, traMADol, gabapentin, ONGLYZA, tiZANidine, metFORMIN, rosuvastatin, levothyroxine, clopidogrel, omeprazole, glimepiride, ARIPiprazole, carvedilol, and amLODipine.  Allergies as of 02/17/2017   No Known Allergies     Medication List         Accurate as of 02/17/17 11:59 PM. Always use your most recent med list.          ALEVE 220 MG Caps Generic drug:  Naproxen Sodium Take 2 capsules by mouth 2 (two) times daily.   amLODipine 5 MG tablet Commonly known as:  NORVASC take 1 tablet by mouth once daily for BLOOD PRESSURE   ARIPiprazole 15 MG tablet Commonly known as:  ABILIFY Take 1 tablet (15 mg total) by mouth daily.   buPROPion 150 MG 24 hr tablet Commonly known as:  WELLBUTRIN XL One daily for one week, then two daily   carvedilol 25 MG tablet Commonly known as:  COREG take 1 tablet by mouth twice a day   cholecalciferol 400 units Tabs tablet Commonly known as:  VITAMIN D Take 400 Units by mouth daily.   clopidogrel 75 MG tablet Commonly known as:  PLAVIX take 1 tablet by mouth daily   gabapentin 300 MG capsule Commonly known as:  NEURONTIN take 1 capsule by mouth three times a day   glimepiride 4 MG tablet Commonly known as:  AMARYL Take 4 mg by mouth daily with breakfast.   levothyroxine 100 MCG tablet Commonly known as:  SYNTHROID, LEVOTHROID Take 100 mcg by mouth daily before breakfast.   lisinopril 40 MG tablet Commonly known as:  PRINIVIL,ZESTRIL take 1 tablet by mouth daily   metFORMIN 1000 MG (MOD) 24 hr tablet Commonly known as:  GLUMETZA Take 1 tablet (1,000 mg total) by mouth daily with breakfast.   multivitamin with minerals tablet Take 1 tablet by mouth daily.   omeprazole 40 MG capsule Commonly known as:  PRILOSEC take 1 capsule by mouth once daily   ONGLYZA 5 MG Tabs tablet Generic drug:  saxagliptin HCl take 1 tablet by mouth daily   rosuvastatin 20 MG tablet Commonly known as:  CRESTOR Take 20 mg by mouth daily.   tiZANidine 2 MG tablet Commonly known as:  ZANAFLEX Take 1 tablet (2 mg total) by mouth 3 (three) times daily.   traMADol 50 MG tablet Commonly known as:  ULTRAM take 1 tablet by mouth every 6 hours if needed for MODERATE PAIN         Follow-up: No Follow-up on file.  Claretta Fraise, M.D.

## 2017-02-17 NOTE — Therapy (Signed)
[facility] [address] [place], [place], [address] Phone: [phone]   Fax:  [phone]  Physical Therapy Treatment  Patient Details  Name: Brian Hall MRN: [id number] Date of Birth: 03-09-1956 Referring Provider: Antony Contras  Encounter Date: 02/17/2017      PT End of Session - 02/17/17 1615    Visit Number 3  New episode of care 02/10/17   Number of Visits 10   Date for PT Re-Evaluation 04/11/17   Authorization Type BCBS -previous note of 30 visit limit combined with PT/OT (29 were left prior to PT/OT eval 02/10/17)   Authorization - Visit Number 3   Authorization - Number of Visits 29  PT requested clarification from Laverda Page 02/14/17   PT Start Time 1402   PT Stop Time 1447   PT Time Calculation (min) 45 min   Equipment Utilized During Treatment Gait belt   Activity Tolerance Patient tolerated treatment well   Behavior During Therapy [facility] for tasks assessed/performed      Past Medical History:  Diagnosis Date  . Blurred vision   . Cardiac LV ejection fraction 10-20%   . Cardiogenic shock ([name])   . CHF (congestive heart failure) ([place])   . Diabetes mellitus   . 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 Hyperkalemia   . Hyperlipemia   . Hypertension   . Hypothyroidism   . Joint pain   . Joint swelling   . Obesity   . Renal insufficiency   . SOB (shortness of breath)   . Stroke ([place]) 09/12/2014  . Throat cancer ([place]) 2011   s/p neck dissection, radiation, chemo    Past Surgical History:  Procedure Laterality Date  . CARDIAC CATHETERIZATION    . NECK DISSECTION Right 2011   s/p resection of throat cancer with multiple nodes removed    There were no vitals filed for this visit.      Subjective Assessment - 02/17/17 1600    Subjective Reports not doing exercises "but I'm going to do better this week."    Patient is accompained by: Family member   Patient Stated Goals Pt's goal  for therapy is to start back doing what I'm supposed to.   Currently in Pain? Yes   Pain Score 6    Pain Location Hip   Pain Orientation Left   Pain Descriptors / Indicators Aching   Pain Type Chronic pain   Pain Onset More than a month ago   Pain Frequency Intermittent   Aggravating Factors  mostly walking   Pain Relieving Factors sitting, medications                         [facility] Adult PT Treatment/Exercise - 02/17/17 0001      Transfers   Transfers Sit to Stand;Stand to Sit   Sit to Stand 5: Supervision   Sit to Stand Details Verbal cues for sequencing;Verbal cues for technique;Verbal cues for precautions/safety   Stand to Sit 5: Supervision;With upper extremity assist   Stand to Sit Details (indicate cue type and reason) Verbal cues for technique;Verbal cues for precautions/safety;Verbal cues for gait pattern     Ambulation/Gait   Ambulation/Gait Yes   Ambulation/Gait Assistance 5: Supervision   Ambulation/Gait Assistance Details Ambulated short distance from therapy mat to/from ADL kitchen to address independence with ADLs at home.  Pt able to ambulate at S level with cues for upright posture and forward gaze.  Ambulation Distance (Feet) 25 Feet  x 2 reps   Assistive device Rolling walker   Gait Pattern Step-through pattern;Decreased stride length;Trunk rotated posteriorly on left;Decreased weight shift to left;Lateral hip instability;Trunk flexed   Ambulation Surface Level;Indoor     Self-Care   Self-Care Other Self-Care Comments   Other Self-Care Comments  Had very frank discussion with pt and wife regarding pts progress in therapy and increasing activity at home.  Note that he is still mostly sedentary and also is still not ambulating to bathroom but once a day.  Discussed that pt is very self limiting and that the only way to progress and improve strength, balance and endurance is by doing the activities/exercises and increasing the amount he does daily.   With this in mind, PT came up with 4 items for pt to do daily: walk to restroom 3x/day, walk to kitchen to retrieve item 2x/day, do exercises 1x/day, and stand to perform ADLs for 3 mins, 1x/day.  Provided this in handout form with blanks so that he could then check them off when compliant.  Pt and wife verbalized understanding.       Therapeutic Activites    Therapeutic Activities Other Therapeutic Activities   Other Therapeutic Activities Pt stating that he can't open fridge and retrieve items (like water bottle) at home.  Had pt ambulate to kitchen in therapy gym with RW, work on opening fridge and retrieving bottle, placing in his pocket or stating to use walker bag (wife states he has one at home) to place items until he can get seated.  Also encouraged him to do this (and esp use bag) to do simple tasks in the kitchen such as getting things ready to make a sandwhich.  Pt and wife verbalized understanding.                  PT Education - 02/17/17 1614    Education provided Yes   Education Details see self care   Person(s) Educated Patient;Spouse   Methods Explanation;Handout   Comprehension Verbalized understanding          PT Short Term Goals - 02/10/17 2032      PT SHORT TERM GOAL #1   Title Same as LTGs = 5 weeks           PT Long Term Goals - 02/10/17 2027      PT LONG TERM GOAL #1   Title Pt will be independent with HEP to improve functional mobility, balance and gait.  TARGET 03/14/17   Time 5   Period Weeks   Status New     PT LONG TERM GOAL #2   Title Pt will improve gait velocity to at least 0.8 ft/sec for decreased fall risk, improved efficiency of gait in the home.   Time 5   Period Weeks   Status New     PT LONG TERM GOAL #3   Title Pt will report no more than 4/10 pain (at worst) in back and LLE during gait in order to indicate that pain is decreased limiting factor in mobility.     Time 5   Period Weeks   Status New     PT LONG TERM GOAL #4    Title Pt will improve gait in 3 minute walk test to at least 100 ft for improved gait efficiency and safety in home environment.   Time 5   Period Weeks   Status New  Plan - 02/17/17 1616    Clinical Impression Statement Skilled session focused on addressing pts independence with ADLs with use of RW and increasing overall activity at home.  PT came up with daily chart for pt to complete, see self care for tasks to increase compliance.     Rehab Potential Good   Clinical Impairments Affecting Rehab Potential chronicity of deficits, pt motivation   PT Frequency 2x / week   PT Duration 4 weeks  plus 1x/wk following eval   PT Treatment/Interventions ADLs/Self Care Home Management;Functional mobility training;Gait training;DME Instruction;Neuromuscular re-education;Patient/family education;Orthotic Fit/Training;Balance training;Therapeutic exercise;Therapeutic activities;Passive range of motion   PT Next Visit Plan check compliance with daily ADL chart, Review HEP given this visit; lower extremity stretch/strengthening, standing balance and gait with RW (pt may need new comfort grip on hand grip of walker due to excess hand sweating)   Consulted and Agree with Plan of Care Patient;Family member/caregiver   Family Member Consulted wife      Patient will benefit from skilled therapeutic intervention in order to improve the following deficits and impairments:  Abnormal gait, Decreased activity tolerance, Decreased balance, Decreased mobility, Decreased coordination, Decreased range of motion, Decreased safety awareness, Difficulty walking, Decreased strength, Impaired flexibility, Postural dysfunction, Pain  Visit Diagnosis: Abnormal posture  Other abnormalities of gait and mobility  Muscle weakness (generalized)  Unsteadiness on feet     Problem List Patient Active Problem List   Diagnosis Date Noted  . Adjustment reaction with aggression   . Depression due to  old stroke 12/16/2016  . Gait disturbance, post-stroke 07/30/2016  . 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 Emotional lability 04/22/2016  . CKD (chronic kidney disease) 04/01/2016  . Spastic hemiplegia affecting nondominant side ([place]) 02/26/2016  . Hyperlipemia 01/17/2016  . BPH (benign prostatic hyperplasia) 10/16/2015  . 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 Hemiparesis affecting left side as late effect of cerebrovascular accident (CVA) ([place]) 11/08/2014  . Hypothyroidism 10/27/2014  . Right carotid artery occlusion   . Essential hypertension 09/12/2014  . Type 2 diabetes mellitus with peripheral neuropathy ([place]) 09/12/2014   Cameron Sprang, PT, MPT [facility] [address] [place], [place], [address] Phone: [phone]   Fax:  [phone] 02/17/17, 4:21 PM  Name: Brian Hall MRN: [id number] Date of Birth: 26-Mar-1956

## 2017-02-20 ENCOUNTER — Encounter: Payer: Self-pay | Admitting: Rehabilitation

## 2017-02-20 ENCOUNTER — Ambulatory Visit: Payer: BLUE CROSS/BLUE SHIELD | Admitting: Rehabilitation

## 2017-02-20 DIAGNOSIS — R293 Abnormal posture: Secondary | ICD-10-CM

## 2017-02-20 DIAGNOSIS — R2689 Other abnormalities of gait and mobility: Secondary | ICD-10-CM

## 2017-02-20 DIAGNOSIS — G8114 Spastic hemiplegia affecting left nondominant side: Secondary | ICD-10-CM | POA: Diagnosis not present

## 2017-02-20 DIAGNOSIS — M6281 Muscle weakness (generalized): Secondary | ICD-10-CM

## 2017-02-20 DIAGNOSIS — R2681 Unsteadiness on feet: Secondary | ICD-10-CM

## 2017-02-20 NOTE — Patient Instructions (Addendum)
Hip Flexor Stretch    Lying on back near edge of bed, bend one leg, foot flat. Hang other leg over edge, relaxed, thigh resting entirely on bed for __2__ minutes. Repeat _2-3___ times. Do __2__ sessions per day. Advanced Exercise: Bend knee back keeping thigh in contact with bed.  http://gt2.exer.us/346   Copyright  VHI. All rights reserved.    Extensors / Rotators, Supine    Brian Hall will help with this one.  Lie supine, one leg straight, other leg bent, knee held by opposite hand. Gently pull L knee toward opposite shoulder. Feel stretch in buttocks and outside of hip. Hold _60__ seconds. Repeat __2-3_ times per session. Do _2__ sessions per day.  Copyright  VHI. All rights reserved.

## 2017-02-20 NOTE — Therapy (Signed)
[facility] [address] [place], [place], [address] Phone: [phone]   Fax:  [phone]  Physical Therapy Treatment  Patient Details  Name: Brian Hall MRN: [id number] Date of Birth: 18-Dec-1955 Referring Provider: Antony Contras  Encounter Date: 02/20/2017      PT End of Session - 02/20/17 2059    Visit Number 4  New episode of care 02/10/17   Number of Visits 10   Date for PT Re-Evaluation 04/11/17   Authorization Type BCBS -previous note of 30 visit limit combined with PT/OT (29 were left prior to PT/OT eval 02/10/17)   Authorization - Visit Number 4   Authorization - Number of Visits 29  PT requested clarification from Laverda Page 02/14/17   PT Start Time 1534   PT Stop Time 1615   PT Time Calculation (min) 41 min   Equipment Utilized During Treatment Gait belt   Activity Tolerance Patient tolerated treatment well   Behavior During Therapy [facility] for tasks assessed/performed      Past Medical History:  Diagnosis Date  . Blurred vision   . Cardiac LV ejection fraction 10-20%   . Cardiogenic shock ([place])   . CHF (congestive heart failure) ([place])   . Diabetes mellitus   . 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 Hyperkalemia   . Hyperlipemia   . Hypertension   . Hypothyroidism   . Joint pain   . Joint swelling   . Obesity   . Renal insufficiency   . SOB (shortness of breath)   . Stroke ([place]) 09/12/2014  . Throat cancer ([place]) 2011   s/p neck dissection, radiation, chemo    Past Surgical History:  Procedure Laterality Date  . CARDIAC CATHETERIZATION    . NECK DISSECTION Right 2011   s/p resection of throat cancer with multiple nodes removed    There were no vitals filed for this visit.      Subjective Assessment - 02/20/17 1542    Subjective Reports (and showed) that he has been doing more at home via chart.    Patient is accompained by: Family member   Patient Stated Goals Pt's goal  for therapy is to start back doing what I'm supposed to.   Currently in Pain? Yes   Pain Score 7    Pain Location Back   Pain Orientation Left   Pain Descriptors / Indicators Aching   Pain Type Chronic pain   Pain Onset More than a month ago   Pain Frequency Intermittent   Aggravating Factors  mostly walking   Pain Relieving Factors sitting, medications                         [facility] Adult PT Treatment/Exercise - 02/20/17 0001      Ambulation/Gait   Ambulation/Gait Yes   Ambulation/Gait Assistance 5: Supervision   Ambulation/Gait Assistance Details Worked on gait with use of RW, AFO and HO to address gait quality and endurance.  Performed 1' which took him just over 6 minutes to complete.  Provided cues and facilitation for upright posture, decreased L trunk rotation, improved L hip protraction and improved gait speed.   Discussed that once pt is ambulatory more to restroom at home (which should be automatic) that task would be taken off list and would add walking in home for endurance.     Ambulation Distance (Feet) 115 Feet   Assistive device Rolling walker   Gait Pattern Step-through  pattern;Decreased stride length;Trunk rotated posteriorly on left;Decreased weight shift to left;Lateral hip instability;Trunk flexed   Ambulation Surface Level;Indoor     Neuro Re-ed    Neuro Re-ed Details  NMR during sit<>stand initially with small block under RLE (2") to encourage L weight shift and WB.  Pt still very reliant on RUE/LE and demos increased trunk rotation to the R.  had pt work on pelvic tilt (laterally to the L) and changed hand placement with RUE on L leg.  Provided up to max A to facilitate improved standing technique.  Would like to continue to address in future sessions for improved carryover at home.      Exercises   Exercises Other Exercises   Other Exercises  supine hip flex stretch off EOM x 3 mins on L side.  Provided this for HEP.  Also performed supine L  piriformis stretch (given during last episode of care) to address L hip tightness.  Tolerated well and also added to HEP.                  PT Education - 02/20/17 2059    Education provided Yes   Education Details additions to HEP   Person(s) Educated Patient;Spouse   Methods Explanation;Demonstration;Handout   Comprehension Verbalized understanding;Returned demonstration          PT Short Term Goals - 02/10/17 2032      PT SHORT TERM GOAL #1   Title Same as LTGs = 5 weeks           PT Long Term Goals - 02/10/17 2027      PT LONG TERM GOAL #1   Title Pt will be independent with HEP to improve functional mobility, balance and gait.  TARGET 03/14/17   Time 5   Period Weeks   Status New     PT LONG TERM GOAL #2   Title Pt will improve gait velocity to at least 0.8 ft/sec for decreased fall risk, improved efficiency of gait in the home.   Time 5   Period Weeks   Status New     PT LONG TERM GOAL #3   Title Pt will report no more than 4/10 pain (at worst) in back and LLE during gait in order to indicate that pain is decreased limiting factor in mobility.     Time 5   Period Weeks   Status New     PT LONG TERM GOAL #4   Title Pt will improve gait in 3 minute walk test to at least 100 ft for improved gait efficiency and safety in home environment.   Time 5   Period Weeks   Status New               Plan - 02/20/17 2100    Clinical Impression Statement Skilled session focused on addressing gait for quality and endurance, LE stretching to improve flexibility and NMR to improve L lateral weight shift and WB.     Rehab Potential Good   Clinical Impairments Affecting Rehab Potential chronicity of deficits, pt motivation   PT Frequency 2x / week   PT Duration 4 weeks  plus 1x/wk following eval   PT Treatment/Interventions ADLs/Self Care Home Management;Functional mobility training;Gait training;DME Instruction;Neuromuscular re-education;Patient/family  education;Orthotic Fit/Training;Balance training;Therapeutic exercise;Therapeutic activities;Passive range of motion   PT Next Visit Plan check compliance with daily ADL chart (keeping in folder-add walking program when able), Review HEP given this visit; lower extremity stretch/strengthening, standing balance and gait with RW (  pt may need new comfort grip on hand grip of walker due to excess hand sweating)   Consulted and Agree with Plan of Care Patient;Family member/caregiver   Family Member Consulted wife      Patient will benefit from skilled therapeutic intervention in order to improve the following deficits and impairments:  Abnormal gait, Decreased activity tolerance, Decreased balance, Decreased mobility, Decreased coordination, Decreased range of motion, Decreased safety awareness, Difficulty walking, Decreased strength, Impaired flexibility, Postural dysfunction, Pain  Visit Diagnosis: Abnormal posture  Other abnormalities of gait and mobility  Muscle weakness (generalized)  Unsteadiness on feet     Problem List Patient Active Problem List   Diagnosis Date Noted  . Adjustment reaction with aggression   . 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 Gait disturbance, post-stroke 07/30/2016  . 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 Emotional lability 04/22/2016  . CKD (chronic kidney disease) 04/01/2016  . Spastic hemiplegia affecting nondominant side ([place]) 02/26/2016  . Hyperlipemia 01/17/2016  . BPH (benign prostatic hyperplasia) 10/16/2015  . 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 Hemiparesis affecting left side as late effect of cerebrovascular accident (CVA) ([place]) 11/08/2014  . Hypothyroidism 10/27/2014  . Right carotid artery occlusion   . Essential hypertension 09/12/2014  . Type 2 diabetes mellitus with peripheral neuropathy ([place]) 09/12/2014    Cameron Sprang, PT, MPT [facility] [address] [place], [place], [address] Phone: [phone]   Fax:  [phone] 02/20/17, 9:04 PM  Name: Brian Hall MRN: [id number] Date of Birth: [birth year]

## 2017-02-22 ENCOUNTER — Other Ambulatory Visit: Payer: Self-pay | Admitting: Family Medicine

## 2017-02-24 ENCOUNTER — Ambulatory Visit: Payer: BLUE CROSS/BLUE SHIELD | Admitting: Physical Therapy

## 2017-02-24 DIAGNOSIS — G8114 Spastic hemiplegia affecting left nondominant side: Secondary | ICD-10-CM | POA: Diagnosis not present

## 2017-02-24 DIAGNOSIS — R2689 Other abnormalities of gait and mobility: Secondary | ICD-10-CM

## 2017-02-24 DIAGNOSIS — M6281 Muscle weakness (generalized): Secondary | ICD-10-CM

## 2017-02-24 DIAGNOSIS — R293 Abnormal posture: Secondary | ICD-10-CM

## 2017-02-24 NOTE — Therapy (Signed)
[facility] [address] [place], [place], [address] Phone: [phone]   Fax:  [phone]  Physical Therapy Treatment  Patient Details  Name: Brian Hall MRN: [id number] Date of Birth: 1955-11-14 Referring Provider: Antony Contras  Encounter Date: 02/24/2017      PT End of Session - 02/24/17 1217    Visit Number 5  New episode of care 02/10/17   Number of Visits 10   Date for PT Re-Evaluation 04/11/17   Authorization Type BCBS -previous note of 30 visit limit combined with PT/OT (29 were left prior to PT/OT eval 02/10/17)   Authorization - Visit Number 4   Authorization - Number of Visits 29  PT requested clarification from Laverda Page 02/14/17   Equipment Utilized During Treatment Gait belt   Activity Tolerance Patient tolerated treatment well   Behavior During Therapy [facility] for tasks assessed/performed      Past Medical History:  Diagnosis Date  . Blurred vision   . Cardiac LV ejection fraction 10-20%   . Cardiogenic shock ([place])   . CHF (congestive heart failure) ([place])   . Diabetes mellitus   . 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 Hyperkalemia   . Hyperlipemia   . Hypertension   . Hypothyroidism   . Joint pain   . Joint swelling   . Obesity   . Renal insufficiency   . SOB (shortness of breath)   . Stroke ([place]) 09/12/2014  . Throat cancer ([place]) 2011   s/p neck dissection, radiation, chemo    Past Surgical History:  Procedure Laterality Date  . CARDIAC CATHETERIZATION    . NECK DISSECTION Right 2011   s/p resection of throat cancer with multiple nodes removed    There were no vitals filed for this visit.      Subjective Assessment - 02/24/17 0807    Subjective Reports and showed he has done several parts of HEP over the weekend.  No other changes.  Just feel tired when I'm up and moving around.   Patient is accompained by: Family member   Patient Stated Goals Pt's goal for therapy  is to start back doing what I'm supposed to.   Currently in Pain? Yes   Pain Score 6    Pain Location Hip   Pain Orientation Left   Pain Descriptors / Indicators Aching   Pain Onset More than a month ago   Pain Frequency Intermittent   Aggravating Factors  getting up and moving   Pain Relieving Factors sitting                         [facility] Adult PT Treatment/Exercise - 02/24/17 0812      Transfers   Transfers Sit to Stand;Stand to Sit   Sit to Stand 5: Supervision   Sit to Stand Details Verbal cues for sequencing;Verbal cues for technique;Verbal cues for precautions/safety   Stand to Sit 5: Supervision;With upper extremity assist   Stand to Sit Details (indicate cue type and reason) Verbal cues for technique;Verbal cues for precautions/safety;Verbal cues for gait pattern   Comments Practiced multiple reps of sit<>stand for neuro re-ed for improved weightshift through and improved weightbearing through LLE.  With this blocked practice, pt requires mod-max assistance at times for forward lean and increased weigthshifting to LLE.  Pt also needs cues for proper breathing technique, as tends to hold his breath.     Ambulation/Gait   Ambulation/Gait Yes  Ambulation/Gait Assistance 5: Supervision   Ambulation/Gait Assistance Details Worked on gait training with RW and AFO and HO, with PT providing tactile and verbal cues for improved posture, L hip protraction/flexion for improved foot clearance.   Ambulation Distance (Feet) 115 Feet  thn 30   Assistive device Rolling walker   Gait Pattern Step-through pattern;Decreased stride length;Trunk rotated posteriorly on left;Decreased weight shift to left;Lateral hip instability;Trunk flexed   Ambulation Surface Level;Indoor     Exercises   Exercises Lumbar   Other Exercises  supine hip flex stretch off EOM x 3 mins on L side.  Reviewed this for HEP.  Also performed supine L piriformis stretch (given during last episode of  care)-reviewed wtih pt retrun demo understanding.  Lumbar trunk rotation stretch, 3 reps each side x 30 seconds     Lumbar Exercises: Seated   Other Seated Lumbar Exercises Seated anterior/posterior pelvic tilt x 10 reps; lateral weightshifting x 10 reps                  PT Short Term Goals - 02/10/17 2032      PT SHORT TERM GOAL #1   Title Same as LTGs = 5 weeks           PT Long Term Goals - 02/10/17 2027      PT LONG TERM GOAL #1   Title Pt will be independent with HEP to improve functional mobility, balance and gait.  TARGET 03/14/17   Time 5   Period Weeks   Status New     PT LONG TERM GOAL #2   Title Pt will improve gait velocity to at least 0.8 ft/sec for decreased fall risk, improved efficiency of gait in the home.   Time 5   Period Weeks   Status New     PT LONG TERM GOAL #3   Title Pt will report no more than 4/10 pain (at worst) in back and LLE during gait in order to indicate that pain is decreased limiting factor in mobility.     Time 5   Period Weeks   Status New     PT LONG TERM GOAL #4   Title Pt will improve gait in 3 minute walk test to at least 100 ft for improved gait efficiency and safety in home environment.   Time 5   Period Weeks   Status New               Plan - 02/24/17 1218    Clinical Impression Statement Cotninued to address neuro-reeducation for improved weightbearing with transfers and gait, as well as lower extremity stretching for improved flexibility.  Pt appears to be performing about 50% of exercises listed on HEP papers from previous therapist.  Pt will continue to benefit from further skilled PT to work towards [place].    Rehab Potential Good   Clinical Impairments Affecting Rehab Potential chronicity of deficits, pt motivation   PT Frequency 2x / week   PT Duration 4 weeks  plus 1x/wk following eval   PT Treatment/Interventions ADLs/Self Care Home Management;Functional mobility training;Gait training;DME  Instruction;Neuromuscular re-education;Patient/family education;Orthotic Fit/Training;Balance training;Therapeutic exercise;Therapeutic activities;Passive range of motion   PT Next Visit Plan check compliance with daily ADL chart-consider adding walking program.  Continue lower extremity stretch/strengthening, standing balance and gait with RW (pt may need new comfort grip on hand grip of walker due to excess hand sweating)   Consulted and Agree with Plan of Care Patient;Family member/caregiver   Family Member  Consulted wife      Patient will benefit from skilled therapeutic intervention in order to improve the following deficits and impairments:  Abnormal gait, Decreased activity tolerance, Decreased balance, Decreased mobility, Decreased coordination, Decreased range of motion, Decreased safety awareness, Difficulty walking, Decreased strength, Impaired flexibility, Postural dysfunction, Pain  Visit Diagnosis: Muscle weakness (generalized)  Abnormal posture  Other abnormalities of gait and mobility     Problem List Patient Active Problem List   Diagnosis Date Noted  . Adjustment reaction with aggression   . 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 Gait disturbance, post-stroke 07/30/2016  . 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 Emotional lability 04/22/2016  . CKD (chronic kidney disease) 04/01/2016  . Spastic hemiplegia affecting nondominant side ([place]) 02/26/2016  . Hyperlipemia 01/17/2016  . BPH (benign prostatic hyperplasia) 10/16/2015  . 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 Hemiparesis affecting left side as late effect of cerebrovascular accident (CVA) ([place]) 11/08/2014  . Hypothyroidism 10/27/2014  . Right carotid artery occlusion   . Essential hypertension 09/12/2014  . Type 2 diabetes mellitus with peripheral neuropathy ([place]) 09/12/2014    Ell Tiso W. 02/24/2017, 12:32 PM Frazier Butt., PT  [facility] [address] [place], [place], [address] Phone: [phone]   Fax:  [phone]  Name: JAYQUAN BRADSHER MRN: [id number] Date of Birth: 1956/05/30

## 2017-02-26 ENCOUNTER — Encounter: Payer: Self-pay | Admitting: *Deleted

## 2017-02-28 ENCOUNTER — Ambulatory Visit
Admission: RE | Admit: 2017-02-28 | Discharge: 2017-02-28 | Disposition: A | Discharge status: Home / Self Care | Payer: BLUE CROSS/BLUE SHIELD | Source: Ambulatory Visit | Attending: Neurology | Admitting: Neurology

## 2017-02-28 ENCOUNTER — Ambulatory Visit: Payer: BLUE CROSS/BLUE SHIELD | Admitting: Physical Therapy

## 2017-02-28 DIAGNOSIS — G8114 Spastic hemiplegia affecting left nondominant side: Secondary | ICD-10-CM | POA: Diagnosis not present

## 2017-02-28 DIAGNOSIS — R293 Abnormal posture: Secondary | ICD-10-CM

## 2017-02-28 DIAGNOSIS — G8194 Hemiplegia, unspecified affecting left nondominant side: Secondary | ICD-10-CM

## 2017-02-28 DIAGNOSIS — R2689 Other abnormalities of gait and mobility: Secondary | ICD-10-CM

## 2017-02-28 DIAGNOSIS — M6281 Muscle weakness (generalized): Secondary | ICD-10-CM

## 2017-02-28 MED ORDER — GADOBENATE DIMEGLUMINE 529 MG/ML IV SOLN
10.0000 mL | Freq: Once | INTRAVENOUS | Status: AC | PRN
  Administered 2017-02-28: 10 mL via INTRAVENOUS

## 2017-02-28 NOTE — Therapy (Signed)
[facility] [address] [place], [place], [address] Phone: [phone]   Fax:  [phone]  Physical Therapy Treatment  Patient Details  Name: Brian Hall MRN: [id number] Date of Birth: [birth year] Referring Provider: Antony Contras  Encounter Date: 02/28/2017      PT End of Session - 02/28/17 1041    Visit Number 6  New episode of care 02/10/17   Number of Visits 10   Date for PT Re-Evaluation 04/11/17   Authorization Type BCBS -previous note of 30 visit limit combined with PT/OT (29 were left prior to PT/OT eval 02/10/17)   Authorization - Visit Number 6   Authorization - Number of Visits 29  PT requested clarification from Laverda Page 02/14/17   PT Start Time 0931   PT Stop Time 1014   PT Time Calculation (min) 43 min   Equipment Utilized During Treatment Gait belt   Activity Tolerance Patient tolerated treatment well   Behavior During Therapy [facility] for tasks assessed/performed      Past Medical History:  Diagnosis Date  . Blurred vision   . Cardiac LV ejection fraction 10-20%   . Cardiogenic shock ([place])   . CHF (congestive heart failure) ([place])   . Diabetes mellitus   . 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 Hyperkalemia   . Hyperlipemia   . Hypertension   . Hypothyroidism   . Joint pain   . Joint swelling   . Obesity   . Renal insufficiency   . SOB (shortness of breath)   . Stroke ([place]) 09/12/2014  . Throat cancer ([place]) 2011   s/p neck dissection, radiation, chemo    Past Surgical History:  Procedure Laterality Date  . CARDIAC CATHETERIZATION    . NECK DISSECTION Right 2011   s/p resection of throat cancer with multiple nodes removed    There were no vitals filed for this visit.      Subjective Assessment - 02/28/17 0936    Subjective Have had a time trying to schedule the MRI.  Wife showed papers from activities at home and he is not doing the activities consistently-one day he  sat in recliner all day long.   Patient is accompained by: Family member   Patient Stated Goals Pt's goal for therapy is to start back doing what I'm supposed to.   Currently in Pain? Yes   Pain Score 6    Pain Location Hip   Pain Orientation Left   Pain Descriptors / Indicators Aching   Pain Type Chronic pain   Pain Onset More than a month ago   Pain Frequency Intermittent   Aggravating Factors  getting up and moving   Pain Relieving Factors sitting                    Neuro Re-education: Standing in parallel bars-forward/back walking with 1 UE support 10 ft x 2 -LLE as stance with R hip/knee flexion x 5 reps -LLE as stance with R step taps to 2" block x 10 reps -RLE standing on 4" block with active, active-assisted LLE hip flexion/extension, 2 sets x 10 reps -Standing with widened BOS, with lateral weightshifting 5 reps-tactile and verbal cues for upright posture      [facility] Adult PT Treatment/Exercise - 02/28/17 0001      Transfers   Transfers Sit to Stand;Stand to Sit   Sit to Stand 5: Supervision   Sit to Stand Details Verbal cues for sequencing;Verbal cues  for technique;Verbal cues for precautions/safety   Stand to Sit 5: Supervision;With upper extremity assist   Stand to Sit Details (indicate cue type and reason) Verbal cues for technique;Verbal cues for precautions/safety;Verbal cues for gait pattern   Number of Reps Other reps (comment)  at least 8 reps through session due to rest breaks         Self Care: Discussed difference between pain and fatigue as limited factors in ADL and HEP participation at home.  Discussed factors required to build strength and endurance are to continue activities and progress activity level on a regular, consistent basis.  Educated patient again on importance of participation in ADL activities and HEP consistently at home.  Pt verbalizes understanding and plans to increase participation.        PT Education - 02/28/17  1040    Education provided Yes   Education Details Discussed pain versus fatigue.  Discussed if fatigue is the limiting factor, break tasks up into doable activities (from the sheet) and then just know to rest afterward; talked about building endurance   Person(s) Educated Patient;Spouse   Methods Explanation   Comprehension Verbalized understanding          PT Short Term Goals - 02/10/17 2032      PT SHORT TERM GOAL #1   Title Same as LTGs = 5 weeks           PT Long Term Goals - 02/10/17 2027      PT LONG TERM GOAL #1   Title Pt will be independent with HEP to improve functional mobility, balance and gait.  TARGET 03/14/17   Time 5   Period Weeks   Status New     PT LONG TERM GOAL #2   Title Pt will improve gait velocity to at least 0.8 ft/sec for decreased fall risk, improved efficiency of gait in the home.   Time 5   Period Weeks   Status New     PT LONG TERM GOAL #3   Title Pt will report no more than 4/10 pain (at worst) in back and LLE during gait in order to indicate that pain is decreased limiting factor in mobility.     Time 5   Period Weeks   Status New     PT LONG TERM GOAL #4   Title Pt will improve gait in 3 minute walk test to at least 100 ft for improved gait efficiency and safety in home environment.   Time 5   Period Weeks   Status New               Plan - 02/28/17 1043    Clinical Impression Statement Continued to address neuro-re-education for improved weightbearing and improved step length through LLE.  Pt has not been performing activities set forth in HEP for home.  Had frank discussion about pt's limited participation in activities at home that are meant to supplement therapy.  Pt ultimately reports he doesn't do them because of fatigue, not so much because of pain.  Therapy progress is limited due to pt's fatigue and decreased home participation.   Rehab Potential Good   Clinical Impairments Affecting Rehab Potential chronicity of  deficits, pt motivation   PT Frequency 2x / week   PT Duration 4 weeks  plus 1x/wk following eval   PT Treatment/Interventions ADLs/Self Care Home Management;Functional mobility training;Gait training;DME Instruction;Neuromuscular re-education;Patient/family education;Orthotic Fit/Training;Balance training;Therapeutic exercise;Therapeutic activities;Passive range of motion   PT Next Visit Plan Standing balance  and gait activities; lower extremity strength and stretchin   Consulted and Agree with Plan of Care Patient;Family member/caregiver   Family Member Consulted wife      Patient will benefit from skilled therapeutic intervention in order to improve the following deficits and impairments:  Abnormal gait, Decreased activity tolerance, Decreased balance, Decreased mobility, Decreased coordination, Decreased range of motion, Decreased safety awareness, Difficulty walking, Decreased strength, Impaired flexibility, Postural dysfunction, Pain  Visit Diagnosis: Muscle weakness (generalized)  Abnormal posture  Other abnormalities of gait and mobility     Problem List Patient Active Problem List   Diagnosis Date Noted  . Adjustment reaction with aggression   . 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 Gait disturbance, post-stroke 07/30/2016  . 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 Emotional lability 04/22/2016  . CKD (chronic kidney disease) 04/01/2016  . Spastic hemiplegia affecting nondominant side ([place]) 02/26/2016  . Hyperlipemia 01/17/2016  . BPH (benign prostatic hyperplasia) 10/16/2015  . 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 Hemiparesis affecting left side as late effect of cerebrovascular accident (CVA) ([place]) 11/08/2014  . Hypothyroidism 10/27/2014  . Right carotid artery occlusion   . Essential hypertension 09/12/2014  . Type 2 diabetes mellitus with peripheral neuropathy ([place]) 09/12/2014    Ivyonna Hoelzel W. 02/28/2017,  10:51 AM  Frazier Butt., PT  [facility] [address] [place], [place], [address] Phone: [phone]   Fax:  [phone]  Name: Brian Hall MRN: [id number] Date of Birth: [birth year]

## 2017-03-03 ENCOUNTER — Encounter: Payer: Self-pay | Admitting: Rehabilitation

## 2017-03-03 ENCOUNTER — Ambulatory Visit: Payer: PPO | Attending: Neurology | Admitting: Rehabilitation

## 2017-03-03 DIAGNOSIS — R293 Abnormal posture: Secondary | ICD-10-CM | POA: Insufficient documentation

## 2017-03-03 DIAGNOSIS — M6281 Muscle weakness (generalized): Secondary | ICD-10-CM | POA: Insufficient documentation

## 2017-03-03 DIAGNOSIS — R2681 Unsteadiness on feet: Secondary | ICD-10-CM | POA: Insufficient documentation

## 2017-03-03 DIAGNOSIS — R2689 Other abnormalities of gait and mobility: Secondary | ICD-10-CM | POA: Diagnosis not present

## 2017-03-03 DIAGNOSIS — G8114 Spastic hemiplegia affecting left nondominant side: Secondary | ICD-10-CM | POA: Insufficient documentation

## 2017-03-03 NOTE — Therapy (Signed)
[facility] [address] [place], [place], [address] Phone: [phone]   Fax:  [phone]  Physical Therapy Treatment  Patient Details  Name: Brian Hall MRN: [id number] Date of Birth: 06-16-1956 Referring Provider: Antony Contras  Encounter Date: 03/03/2017      PT End of Session - 03/03/17 1026    Visit Number 7  New episode of care 02/10/17   Number of Visits 10   Date for PT Re-Evaluation 04/11/17   Authorization Type BCBS -previous note of 30 visit limit combined with PT/OT (29 were left prior to PT/OT eval 02/10/17)   Authorization - Visit Number 7   Authorization - Number of Visits 29  PT requested clarification from Laverda Page 02/14/17   PT Start Time 0845   PT Stop Time 0931   PT Time Calculation (min) 46 min   Equipment Utilized During Treatment Gait belt   Activity Tolerance Patient tolerated treatment well   Behavior During Therapy [facility] for tasks assessed/performed      Past Medical History:  Diagnosis Date  . Blurred vision   . Cardiac LV ejection fraction 10-20%   . Cardiogenic shock ([place])   . CHF (congestive heart failure) ([place])   . Diabetes mellitus   . 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 Hyperkalemia   . Hyperlipemia   . Hypertension   . Hypothyroidism   . Joint pain   . Joint swelling   . Obesity   . Renal insufficiency   . SOB (shortness of breath)   . Stroke ([place]) 09/12/2014  . Throat cancer ([place]) 2011   s/p neck dissection, radiation, chemo    Past Surgical History:  Procedure Laterality Date  . CARDIAC CATHETERIZATION    . NECK DISSECTION Right 2011   s/p resection of throat cancer with multiple nodes removed    There were no vitals filed for this visit.      Subjective Assessment - 03/03/17 0848    Subjective Pt reports having not done much last week (showed PT HEP chart).    Patient is accompained by: Family member   Patient Stated Goals Pt's goal for  therapy is to start back doing what I'm supposed to.   Currently in Pain? Yes   Pain Score 4    Pain Location Hip   Pain Orientation Left   Pain Descriptors / Indicators Aching   Pain Type Chronic pain   Pain Onset More than a month ago   Pain Frequency Intermittent   Aggravating Factors  getting up and moving   Pain Relieving Factors sitting                         [facility] Adult PT Treatment/Exercise - 03/03/17 0001      Ambulation/Gait   Ambulation/Gait Yes   Ambulation/Gait Assistance 6: Modified independent (Device/Increase time);5: Supervision   Ambulation/Gait Assistance Details Pt ambulated x 225' with personal RW at mod I to s level.  S to provide cues for posture, forward gaze, and improving L hip protration during L stance.  Pt very fatigued following gait and states increased pain in LLE.  See below for stretches performed following gait.     Ambulation Distance (Feet) 225 Feet   Assistive device Rolling walker   Gait Pattern Step-through pattern;Decreased stride length;Trunk rotated posteriorly on left;Decreased weight shift to left;Lateral hip instability;Trunk flexed   Ambulation Surface Level;Indoor     Neuro Re-ed  Neuro Re-ed Details  Continue to work on [employer] via sit<>stand transfers with 2" step under RLE.  Note marked improvement today in ability to stand with less assist.  He does require mod A to gain full trunk flexion and forward weight shift neccessary for standing.  Also cues for upright posture and forward trunk flexion at hips rather than slumping forward and also to maintain upright gaze throughout.  Performed x 6 reps.       Exercises   Exercises Other Exercises   Other Exercises  seated piriformis stretch x 2 mins with cues to utilize brace to cross LE over RLE.  Also performed seated hamstring stretch x 2 mins.  Pt tolerated well and is able to perform these on his own at home.  He states increased pain following walking, therefore recommend  he do these following longer bouts of gait.  Seated nustep with BLEs only (tactile assist for LLE to maintain adduction) x 2 mins at level 1 to increase LLE activation and address endurance.   Seated on EOM, PT assisted with thoracic self mobilization and pectoral stretch over peanut ball x 3 mins with PT providing increased stretch at B axilla.                  PT Education - 03/03/17 0849    Education provided Yes   Education Details adding seated stretches vs supine as he can do these on his own following walking when he has increased pain    Person(s) Educated Patient;Spouse   Methods Explanation;Demonstration   Comprehension Verbalized understanding;Returned demonstration          PT Short Term Goals - 02/10/17 2032      PT SHORT TERM GOAL #1   Title Same as LTGs = 5 weeks           PT Long Term Goals - 02/10/17 2027      PT LONG TERM GOAL #1   Title Pt will be independent with HEP to improve functional mobility, balance and gait.  TARGET 03/14/17   Time 5   Period Weeks   Status New     PT LONG TERM GOAL #2   Title Pt will improve gait velocity to at least 0.8 ft/sec for decreased fall risk, improved efficiency of gait in the home.   Time 5   Period Weeks   Status New     PT LONG TERM GOAL #3   Title Pt will report no more than 4/10 pain (at worst) in back and LLE during gait in order to indicate that pain is decreased limiting factor in mobility.     Time 5   Period Weeks   Status New     PT LONG TERM GOAL #4   Title Pt will improve gait in 3 minute walk test to at least 100 ft for improved gait efficiency and safety in home environment.   Time 5   Period Weeks   Status New               Plan - 03/03/17 1026    Clinical Impression Statement Skilled session focused on walking/seated exercise for improved endurance/quality, seated stretching to reduce LLE pain, and NMR during sit<>stand to emphasis improved LLE activation and improved forward  trunk lean and weight shift.  Pt did demonstrate improved technique requiring less assist from PT vs previous session with this PT>    Rehab Potential Good   Clinical Impairments Affecting Rehab Potential chronicity of  deficits, pt motivation   PT Frequency 2x / week   PT Duration 4 weeks  plus 1x/wk following eval   PT Treatment/Interventions ADLs/Self Care Home Management;Functional mobility training;Gait training;DME Instruction;Neuromuscular re-education;Patient/family education;Orthotic Fit/Training;Balance training;Therapeutic exercise;Therapeutic activities;Passive range of motion   PT Next Visit Plan Give seated stretches from last session to replace supine ones Raquel Sarna forgot to give to them) Standing balance and gait activities; lower extremity strength and stretching   Consulted and Agree with Plan of Care Patient;Family member/caregiver   Family Member Consulted wife      Patient will benefit from skilled therapeutic intervention in order to improve the following deficits and impairments:  Abnormal gait, Decreased activity tolerance, Decreased balance, Decreased mobility, Decreased coordination, Decreased range of motion, Decreased safety awareness, Difficulty walking, Decreased strength, Impaired flexibility, Postural dysfunction, Pain  Visit Diagnosis: Muscle weakness (generalized)  Abnormal posture  Other abnormalities of gait and mobility  Unsteadiness on feet     Problem List Patient Active Problem List   Diagnosis Date Noted  . Adjustment reaction with aggression   . 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 Gait disturbance, post-stroke 07/30/2016  . 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 Emotional lability 04/22/2016  . CKD (chronic kidney disease) 04/01/2016  . Spastic hemiplegia affecting nondominant side ([place]) 02/26/2016  . Hyperlipemia 01/17/2016  . BPH (benign prostatic hyperplasia) 10/16/2015  . History of ischemic  right MCA stroke 05/26/2015  . Hemiparesis affecting left side as late effect of cerebrovascular accident (CVA) ([place]) 11/08/2014  . Hypothyroidism 10/27/2014  . Right carotid artery occlusion   . Essential hypertension 09/12/2014  . Type 2 diabetes mellitus with peripheral neuropathy ([place]) 09/12/2014    Cameron Sprang, PT, MPT [facility] [address] [place], [place], [address] Phone: [phone]   Fax:  [phone] 03/03/17, 10:36 AM  Name: Brian Hall MRN: [id number] Date of Birth: Mar 18, 1956

## 2017-03-03 NOTE — Patient Instructions (Signed)
Chair Sitting    Sit at edge of seat, spine straight, left leg extended. Put a hand on each thigh and bend forward from the hip, keeping spine straight. Allow hand on extended leg to reach toward toes. Support upper body with other arm. Hold _60__ seconds. Repeat _2-3__ times per session. Do _2-3__ sessions per day or anytime after you walk and you have increased pain in L leg.   Keep your back straight.  You may not have to lean forward if you feel a stretch when you sit tall.    Copyright  VHI. All rights reserved.   Hip Stretch    Put right ankle over left knee. Let right knee fall downward, but keep ankle in place. Feel the stretch in hip. May push down gently with hand to feel stretch. Hold __60__ seconds while counting out loud. Repeat with other leg. Repeat _2-3___ times. Do __2-3__ sessions per day or as you need to after you walk.  Sit tall!  http://gt2.exer.us/497   Copyright  VHI. All rights reserved.

## 2017-03-06 ENCOUNTER — Encounter: Payer: Self-pay | Admitting: Psychology

## 2017-03-06 NOTE — Progress Notes (Signed)
Patient:  Brian Hall   DOB: 11/16/1955  MR Number: [id number]  Location: [facility] [address], [place] [address] Dept: [phone]  Start: 4 PM End: 5 PM  Provider/Observer:     Edgardo Roys PSYD  Chief Complaint:      Chief Complaint  Patient presents with  . Agitation  . Pain  . Depression  . Stress    Reason For Service:     The patient is a 61 year old Caucasian male that presented to the emergency department in September 12, 2014 with left-sided weakness and slurred speech. MRI of the brain showed a large acute tertiary right MCA tertiary infarct involving the right frontal lobe as well as remote infarcts of the left medial occipital lobe and left cerebellar hemisphere.  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 The patient was treated in the inpatient rehabilitation unit and was discharged with follow-up care with physical medicine and Dr. Letta Pate.  The patient has continued to have some significant adjustment issues and difficulties. Psychosocial stressors related to the relationship between the patient is wife as well as his stepson created complicated situation for him to cope with along with his physical limitations of left side hemiparesis. The patient was referred for neuropsychological interventions and we'll initially work on building coping skills around the adjustment issues.  Interventions Strategy:  Cognitive Behavioral interventions and coping skills building for recovery form CVA  Participation Level:   Active  Participation Quality:  Appropriate and Attentive      Behavioral Observation:  Well Groomed, Alert, and Appropriate.   Current Psychosocial Factors: The patient and his wife report that he has now moved back into the house and it is going ok.  However, his wife's son has moved out and this is  and issues between wife and pt.      Content of Session:   Reveiwed current symptoms and worked on Therapist, occupational  Current Status:   The patient reports that he has still had considerable gait issues and trouble coping post stroke.  Patient Progress:   Stable  Target Goals:   Working on Therapist, occupational due to medical issues following CVA  Last Reviewed:   02/12/2017  Goals Addressed Today:    Worked on coping and adaptive skills  Impression/Diagnosis:   The patient is status post MCA infarct of Right Hemisphere with near total loss of function in right arm and leg.  Patient denies significant change in cognitive function, but he was quite labile in his emotion that may or may not have an organic factor to it.  Will keep assessing that.  Diagnosis:   Spastic hemiparesis affecting nondominant side (HCC)  Adjustment disorder with depressed mood

## 2017-03-07 ENCOUNTER — Encounter: Payer: Self-pay | Admitting: Rehabilitation

## 2017-03-07 ENCOUNTER — Ambulatory Visit: Payer: PPO | Admitting: Rehabilitation

## 2017-03-07 DIAGNOSIS — M6281 Muscle weakness (generalized): Secondary | ICD-10-CM | POA: Diagnosis not present

## 2017-03-07 DIAGNOSIS — R293 Abnormal posture: Secondary | ICD-10-CM

## 2017-03-07 DIAGNOSIS — G8114 Spastic hemiplegia affecting left nondominant side: Secondary | ICD-10-CM

## 2017-03-07 DIAGNOSIS — R2689 Other abnormalities of gait and mobility: Secondary | ICD-10-CM

## 2017-03-07 NOTE — Therapy (Signed)
[facility] [address] [place], [place], [address] Phone: [phone]   Fax:  [phone]  Physical Therapy Treatment  Patient Details  Name: Brian Hall MRN: [id number] Date of Birth: 09/02/1956 Referring Provider: Antony Contras  Encounter Date: 03/07/2017      PT End of Session - 03/07/17 0949    Visit Number 8  New episode of care 02/10/17   Number of Visits 10   Date for PT Re-Evaluation 04/11/17   Authorization Type [employer] -previous note of 30 visit limit combined with PT/OT (29 were left prior to PT/OT eval 02/10/17)   Authorization - Visit Number 9  updated due to error made in visit count   Authorization - Number of Visits 29  PT requested clarification from Laverda Page 02/14/17   PT Start Time 0845   PT Stop Time 0932   PT Time Calculation (min) 47 min   Equipment Utilized During Treatment Gait belt   Activity Tolerance Patient tolerated treatment well   Behavior During Therapy [facility] for tasks assessed/performed      Past Medical History:  Diagnosis Date  . Blurred vision   . Cardiac LV ejection fraction 10-20%   . Cardiogenic shock ([place])   . CHF (congestive heart failure) ([place])   . Diabetes mellitus   . 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 Hyperkalemia   . Hyperlipemia   . Hypertension   . Hypothyroidism   . Joint pain   . Joint swelling   . Obesity   . Renal insufficiency   . SOB (shortness of breath)   . Stroke ([place]) 09/12/2014  . Throat cancer ([place]) 2011   s/p neck dissection, radiation, chemo    Past Surgical History:  Procedure Laterality Date  . CARDIAC CATHETERIZATION    . NECK DISSECTION Right 2011   s/p resection of throat cancer with multiple nodes removed    There were no vitals filed for this visit.      Subjective Assessment - 03/07/17 0850    Subjective Pt still not compliant with HEP (walking program/exercises).     Patient is accompained by: Family  member   Patient Stated Goals Pt's goal for therapy is to start back doing what I'm supposed to.   Currently in Pain? Yes   Pain Score 7    Pain Location Hip   Pain Orientation Left   Pain Descriptors / Indicators Aching   Pain Type Chronic pain   Pain Onset More than a month ago   Pain Frequency Intermittent   Aggravating Factors  getting up and moving   Pain Relieving Factors sitting                         [facility] Adult PT Treatment/Exercise - 03/07/17 0001      Ambulation/Gait   Ambulation/Gait Yes   Ambulation/Gait Assistance 6: Modified independent (Device/Increase time);5: Supervision   Ambulation/Gait Assistance Details Continued cues for upright posture and forward L hip protraction.  Note that when he does this, L LE clears much better vs retracted and forward flexed posture.     Ambulation Distance (Feet) 60 Feet  then another 78'   Assistive device Rolling walker   Gait Pattern Step-through pattern;Decreased stride length;Trunk rotated posteriorly on left;Decreased weight shift to left;Lateral hip instability;Trunk flexed   Ambulation Surface Level;Indoor     Self-Care   Self-Care Other Self-Care Comments   Other Self-Care Comments  Continue to  educate on importance of compliance with walking at home and increasing activity in order to build muscle/return function to baseline and improve endurance.  He continues to state "well I will" and "I'm not going to be like my sister who had a stroke and now she does nothing."   Continue to prepare for D/C next week.       Neuro Re-ed    Neuro Re-ed Details  NMR in // bars with BUE support>single UE support (LUE) with advancing RLE to 4" step and back down with use of mirror for improved posture and L lateral weight shift.  Performed x 10 reps with heavy facilitation for posture and L hip protraction.   Progressed to single LUE support (LUE) while advancing and retrostepping RLE.  Again with emphasis on L lateral  weight shift, decreased trunk rotation, and improved L hip protraction.  Performed x 10 reps.  Pt with moderate fatigue following NMR, but was able to maintain standing (with walking to // bars) x approx 10 mins.       Exercises   Exercises Other Exercises   Other Exercises  Supine hip flex stretch on LLE prior to gait/NMR x 2 reps of 2 mins.  Seated piriformis stretch x  2 mins following gait/NMR.  Modified with PT holding leg slightly away from R knee due to increased pain/tightness.                  PT Education - 03/07/17 0949    Education provided Yes   Education Details see self care   Person(s) Educated Spouse;Patient   Methods Explanation   Comprehension Verbalized understanding          PT Short Term Goals - 02/10/17 2032      PT SHORT TERM GOAL #1   Title Same as LTGs = 5 weeks           PT Long Term Goals - 02/10/17 2027      PT LONG TERM GOAL #1   Title Pt will be independent with HEP to improve functional mobility, balance and gait.  TARGET 03/14/17   Time 5   Period Weeks   Status New     PT LONG TERM GOAL #2   Title Pt will improve gait velocity to at least 0.8 ft/sec for decreased fall risk, improved efficiency of gait in the home.   Time 5   Period Weeks   Status New     PT LONG TERM GOAL #3   Title Pt will report no more than 4/10 pain (at worst) in back and LLE during gait in order to indicate that pain is decreased limiting factor in mobility.     Time 5   Period Weeks   Status New     PT LONG TERM GOAL #4   Title Pt will improve gait in 3 minute walk test to at least 100 ft for improved gait efficiency and safety in home environment.   Time 5   Period Weeks   Status New               Plan - 03/07/17 7616    Clinical Impression Statement Session focused on discussion to prepare for D/C next week due to lack of progress/decreased compliance with HEP.  Also worked on [employer] to improve L lateral weight shift, upright posture and  improved LLE activation to carryover to gait.     Rehab Potential Good   Clinical Impairments Affecting Rehab Potential chronicity  of deficits, pt motivation   PT Frequency 2x / week   PT Duration 4 weeks  plus 1x/wk following eval   PT Treatment/Interventions ADLs/Self Care Home Management;Functional mobility training;Gait training;DME Instruction;Neuromuscular re-education;Patient/family education;Orthotic Fit/Training;Balance training;Therapeutic exercise;Therapeutic activities;Passive range of motion   PT Next Visit Plan work towards LTGs,  Standing balance and gait activities; lower extremity strength and stretching   Consulted and Agree with Plan of Care Patient;Family member/caregiver   Family Member Consulted wife      Patient will benefit from skilled therapeutic intervention in order to improve the following deficits and impairments:  Abnormal gait, Decreased activity tolerance, Decreased balance, Decreased mobility, Decreased coordination, Decreased range of motion, Decreased safety awareness, Difficulty walking, Decreased strength, Impaired flexibility, Postural dysfunction, Pain  Visit Diagnosis: Muscle weakness (generalized)  Other abnormalities of gait and mobility  Abnormal posture  Spastic hemiparesis of left nondominant side (HCC)     Problem List Patient Active Problem List   Diagnosis Date Noted  . Adjustment reaction with aggression   . 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 Gait disturbance, post-stroke 07/30/2016  . 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 Emotional lability 04/22/2016  . CKD (chronic kidney disease) 04/01/2016  . Spastic hemiplegia affecting nondominant side ([place]) 02/26/2016  . Hyperlipemia 01/17/2016  . BPH (benign prostatic hyperplasia) 10/16/2015  . 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 Hemiparesis affecting left side as late effect of cerebrovascular accident (CVA) ([place]) 11/08/2014   . Hypothyroidism 10/27/2014  . Right carotid artery occlusion   . Essential hypertension 09/12/2014  . Type 2 diabetes mellitus with peripheral neuropathy ([place]) 09/12/2014    Cameron Sprang, PT, MPT [facility] [address] [place], [place], [address] Phone: [phone]   Fax:  [phone] 03/07/17, 9:53 AM  Name: Brian Hall MRN: [id number] Date of Birth: [birth year]

## 2017-03-08 ENCOUNTER — Other Ambulatory Visit: Payer: Self-pay | Admitting: Family Medicine

## 2017-03-11 ENCOUNTER — Ambulatory Visit: Payer: PPO | Admitting: Physical Therapy

## 2017-03-11 DIAGNOSIS — M6281 Muscle weakness (generalized): Secondary | ICD-10-CM

## 2017-03-11 DIAGNOSIS — R293 Abnormal posture: Secondary | ICD-10-CM

## 2017-03-11 DIAGNOSIS — R2689 Other abnormalities of gait and mobility: Secondary | ICD-10-CM

## 2017-03-11 NOTE — Patient Instructions (Addendum)
Chair Sitting    Sit at edge of seat, spine straight, left leg extended. Put a hand on each thigh and bend forward from the hip, keeping spine straight. Allow hand on extended leg to reach toward toes. Support upper body with other arm. Hold _60__ seconds. Repeat _2-3__ times per session. Do _2-3__ sessions per day or anytime after you walk and you have increased pain in L leg.   Keep your back straight.  You may not have to lean forward if you feel a stretch when you sit tall.    Copyright  VHI. All rights reserved.   Hip Stretch    Put right ankle over left knee. Let right knee fall downward, but keep ankle in place. Feel the stretch in hip. May push down gently with hand to feel stretch. Hold __60__ seconds while counting out loud. Repeat with other leg. Repeat _2-3___ times. Do __2-3__ sessions per day or as you need to after you walk.  Sit tall!  http://[email or web address].exer.us/497    Community Occupational psychologist of Services Cost  A Matter of Balance Class locations vary. Call [place] on Aging for more information.  [email or web address] [phone] 8-Session program addressing the fear of falling and increasing activity levels of older adults Free to minimal cost  A.C.T. By [employer] [address] [place], [place] [address].  BetaBlues.dk [phone]  Personal training, gym, classes including Silver Sneakers* and ACTion for Aging Adults Fee-based  A.H.O.Y. (Add Health to [place]) Airs on Time [employer] 13, M-F at [place]: [employer],  [place] [facility] [place],  [place], [place] [facility], [address] [address] [facility], [place], [place], [place] [address]  [place] Location: Sharrell Ku. [employer] [place]      [phone]  [phone]  [phone]  [phone]  [phone]  [phone]  [phone]  [phone]  [phone]  [phone]    [phone] A total-body conditioning class for adults 91 and older; designed to increase muscular strength, endurance, range of movement, flexibility, balance, agility and coordination Free  [facility] [place], [place] [address]      [address]      [phone]      P[place]'s class for individualsreturning to exercise after an injury, before or after surgery or for individuals with complex musculoskeletal issues; designed to improve strength, balance , flexibility      $15/class  [place] [address] [place], [place] [address] [email or web address] [place] classes for beginners to advanced [place], [place] [address] [email or web address] [place] Chair Exercises Free, ages 20 and older; Ages 70-59 fee based  Marvia Pickles, [facility] [address] [place] [address] [place], [place] [address] [email or web address] .[email or web address] Nims [phone]  A.H.O.Y. Tai Chi Fee-based Donation based or free  [place] Class locations vary.  Call or email Angela Burke or view website for more information. [email or web address] [phone] Ongoing classes at local YMCAs and  gyms Fee-based  Silver Sneakers A.C.T. By [place] [employer]: [place] [phone]  [phone] Classes designed for older adults who want to improve their strength, flexibility, balance and endurance.   Silver sneakers is covered by some insurance plans and includes a fitness center membership at participating locations. Find out more by calling [phone] or visiting [email or web address] Covered by some insurance plans  [facility] [place] [phone] A.H.O.Y., fitness room, personal training, fitness classes for injury prevention, strength, balance, flexibility, water fitness classes Ages 55+: $74 for 6 months; Ages 91-54: $22 for 6 months  Tai Chi for Everybody [facility] [address] [place], [place] [address] [email or web address] [place] classes for beginners to advanced; geared for seniors Donation Based      [employer] ([place] Others Participate in Exercise     Loyal Gambler. Rosana Hoes, PhD, [place] [email or web address] [place]     [phone]     A comprehensive fitness program for adults.  The program paris senior-level undergraduates Kinesiology students with adults who desire to learn how to exercise safely.  Includes a structural exercise class focusing on functional fitnesss     $100/semester in fall and spring; $75 in summer (no trainers)    *Silver Sneakers is covered by some Personal assistant and includes a  Radio producer at participating locations.  Find out more by calling [phone] or visiting [email or web address]  For additional health and human services resources for senior adults, please contact SeniorLine at [phone] in [place] and [place] at [phone] in all other areas.

## 2017-03-11 NOTE — Therapy (Signed)
[facility] [address] [place], [place], [address] Phone: [phone]   Fax:  [phone]  Physical Therapy Treatment  Patient Details  Name: Brian Hall MRN: [id number] Date of Birth: 1956/07/22 Referring Provider: Antony Contras  Encounter Date: 03/11/2017      PT End of Session - 03/11/17 1449    Visit Number 9  New episode of care 02/10/17   Number of Visits 10   Date for PT Re-Evaluation 04/11/17   Authorization Type BCBS -previous note of 30 visit limit combined with PT/OT (29 were left prior to PT/OT eval 02/10/17).  [employer] 03/11/17 note-emailed LL for clarification   Authorization - Visit Number 10  updated due to error made in visit count   Authorization - Number of Visits 29  PT requested clarification from Laverda Page 02/14/17   PT Start Time 0935   PT Stop Time 1018   PT Time Calculation (min) 43 min   Equipment Utilized During Treatment Gait belt   Activity Tolerance Patient tolerated treatment well   Behavior During Therapy WFL for tasks assessed/performed      Past Medical History:  Diagnosis Date  . Blurred vision   . Cardiac LV ejection fraction 10-20%   . Cardiogenic shock ([place])   . CHF (congestive heart failure) ([place])   . Diabetes mellitus   . 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 Hyperkalemia   . Hyperlipemia   . Hypertension   . Hypothyroidism   . Joint pain   . Joint swelling   . Obesity   . Renal insufficiency   . SOB (shortness of breath)   . Stroke ([place]) 09/12/2014  . Throat cancer ([place]) 2011   s/p neck dissection, radiation, chemo    Past Surgical History:  Procedure Laterality Date  . CARDIAC CATHETERIZATION    . NECK DISSECTION Right 2011   s/p resection of throat cancer with multiple nodes removed    There were no vitals filed for this visit.      Subjective Assessment - 03/11/17 0939    Subjective Reports doing exercises a few times over  the weekend.   Patient is accompained by: Family member   Patient Stated Goals Pt's goal for therapy is to start back doing what I'm supposed to.   Currently in Pain? Yes   Pain Score 6    Pain Location Hip   Pain Orientation Left   Pain Descriptors / Indicators Aching   Pain Onset More than a month ago   Pain Frequency Intermittent   Aggravating Factors  sometimes   Pain Relieving Factors doing the exercises, stretches                         [facility] Adult PT Treatment/Exercise - 03/11/17 0001      Ambulation/Gait   Ambulation/Gait Yes   Ambulation/Gait Assistance 6: Modified independent (Device/Increase time);5: Supervision   Ambulation/Gait Assistance Details Provided cues for upright posture and for L hip protraction/elevation to initiate L swing phase-improved foot clearance noted when pt does this.   Ambulation Distance (Feet) 72 Feet  in 3 minutes; then 58 ft, then 60 ft   Assistive device Rolling walker   Gait Pattern Step-through pattern;Decreased stride length;Trunk rotated posteriorly on left;Decreased weight shift to left;Lateral hip instability;Trunk flexed   Ambulation Surface Level;Indoor   Pre-Gait Activities Pt reports fatigue with above bouts of gait     Self-Care   Self-Care  Other Self-Care Comments   Other Self-Care Comments  Safety education:  pt reports not wearing shoes or AFO when walking at home.  Educated patient on need to don AFO and shoes in the morning when dressing, so he has AFO on for any gait he does at home (even to and from the bathroom) for improved gait safety.  Provided information on and discussed community fitness options-including [facility], [place], and [employer] exercise room.     Exercises   Exercises Other Exercises   Other Exercises  Review of HEP provided last visit:  seated hamstring stretch with L foot propped on floor, 3 x 30 seconds each; seated piriformis stretch, 2 reps 15 seconds RLE, 3 reps 30  seconds LLE.  Pt return demo understanding, with wife needing to assist keeping LLE in place for stretch.  Reviewed seated anterior/posterior pelvic tilt x 10, then lateral pelvic tilts x 10 reps with added cues for L hip elevation (as base for same movement pattern with gait to initiate L stance phase of gait)                PT Education - 03/11/17 1447    Education provided Yes   Education Details Provided pictures and review of seated stretches (piriformis, hamstring) from last week's visit; community fitness options   Person(s) Educated Patient;Spouse   Methods Explanation;Demonstration;Handout   Comprehension Verbalized understanding;Returned demonstration          PT Short Term Goals - 02/10/17 2032      PT SHORT TERM GOAL #1   Title Same as LTGs = 5 weeks           PT Long Term Goals - 03/11/17 1453      PT LONG TERM GOAL #1   Title Pt will be independent with HEP to improve functional mobility, balance and gait.  TARGET 03/14/17   Time 5   Period Weeks   Status New     PT LONG TERM GOAL #2   Title Pt will improve gait velocity to at least 0.8 ft/sec for decreased fall risk, improved efficiency of gait in the home.   Time 5   Period Weeks   Status New     PT LONG TERM GOAL #3   Title Pt will report no more than 4/10 pain (at worst) in back and LLE during gait in order to indicate that pain is decreased limiting factor in mobility.     Time 5   Period Weeks   Status New     PT LONG TERM GOAL #4   Title Pt will improve gait in 3 minute walk test to at least 100 ft for improved gait efficiency and safety in home environment.   Baseline 72 ft in 3 minutes 03/11/17   Time 5   Period Weeks   Status Not Met               Plan - 03/11/17 1457    Clinical Impression Statement Reviewed parts of HEP, with pt return demo understanding.  Assessed 3 minute walk goal, but pt walking 72 ft today in 3 minute walk (versus 75 ft day of eval).  Provided info on  community fitness resources for patient/wife to follow up on, with plans for discharge next visit.   Rehab Potential Good   Clinical Impairments Affecting Rehab Potential chronicity of deficits, pt motivation   PT Frequency 2x / week   PT Duration 4 weeks  plus 1x/wk  following eval   PT Treatment/Interventions ADLs/Self Care Home Management;Functional mobility training;Gait training;DME Instruction;Neuromuscular re-education;Patient/family education;Orthotic Fit/Training;Balance training;Therapeutic exercise;Therapeutic activities;Passive range of motion   PT Next Visit Plan Check remaining LTGs and follow up on pt/wife's plans for community fitness options; discharge next visit   Consulted and Agree with Plan of Care Patient;Family member/caregiver   Family Member Consulted wife      Patient will benefit from skilled therapeutic intervention in order to improve the following deficits and impairments:  Abnormal gait, Decreased activity tolerance, Decreased balance, Decreased mobility, Decreased coordination, Decreased range of motion, Decreased safety awareness, Difficulty walking, Decreased strength, Impaired flexibility, Postural dysfunction, Pain  Visit Diagnosis: Other abnormalities of gait and mobility  Muscle weakness (generalized)  Abnormal posture     Problem List Patient Active Problem List   Diagnosis Date Noted  . Adjustment reaction with aggression   . 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 Gait disturbance, post-stroke 07/30/2016  . 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 Emotional lability 04/22/2016  . CKD (chronic kidney disease) 04/01/2016  . Spastic hemiplegia affecting nondominant side ([place]) 02/26/2016  . Hyperlipemia 01/17/2016  . BPH (benign prostatic hyperplasia) 10/16/2015  . 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 Hemiparesis affecting left side as late effect of cerebrovascular accident (CVA) ([place])  11/08/2014  . Hypothyroidism 10/27/2014  . Right carotid artery occlusion   . Essential hypertension 09/12/2014  . Type 2 diabetes mellitus with peripheral neuropathy ([place]) 09/12/2014    Adron Geisel W. 03/11/2017, 3:02 PM Frazier Butt., PT  [facility] [address] [place], [place], [address] Phone: [phone]   Fax:  [phone]  Name: ARRON TETRAULT MRN: [id number] Date of Birth: [birth year]

## 2017-03-13 ENCOUNTER — Encounter: Payer: Self-pay | Admitting: Rehabilitation

## 2017-03-13 ENCOUNTER — Ambulatory Visit: Payer: PPO | Admitting: Rehabilitation

## 2017-03-13 DIAGNOSIS — G8114 Spastic hemiplegia affecting left nondominant side: Secondary | ICD-10-CM

## 2017-03-13 DIAGNOSIS — R2689 Other abnormalities of gait and mobility: Secondary | ICD-10-CM

## 2017-03-13 DIAGNOSIS — M6281 Muscle weakness (generalized): Secondary | ICD-10-CM

## 2017-03-13 DIAGNOSIS — R293 Abnormal posture: Secondary | ICD-10-CM

## 2017-03-13 NOTE — Therapy (Addendum)
[facility] [address] [place], [place], [address] Phone: [phone]   Fax:  [phone]  Physical Therapy Treatment and DC Summary   Patient Details  Name: Brian Hall MRN: [id number] Date of Birth: 24-Sep-1955 Referring Provider: Antony Contras  Encounter Date: 03/13/2017      PT End of Session - 03/13/17 0857    Visit Number 10  New episode of care 02/10/17   Number of Visits 10   Date for PT Re-Evaluation 04/11/17   Authorization Type BCBS -previous note of 30 visit limit combined with PT/OT (29 were left prior to PT/OT eval 02/10/17).  [employer] 03/11/17 note-emailed LL for clarification   Authorization - Visit Number 11  updated due to error made in visit count   Authorization - Number of Visits 29  PT requested clarification from Laverda Page 02/14/17   PT Start Time 0846   PT Stop Time 0932   PT Time Calculation (min) 46 min   Equipment Utilized During Treatment Gait belt   Activity Tolerance Patient tolerated treatment well   Behavior During Therapy WFL for tasks assessed/performed      Past Medical History:  Diagnosis Date  . Blurred vision   . Cardiac LV ejection fraction 10-20%   . Cardiogenic shock ([place])   . CHF (congestive heart failure) ([place])   . Diabetes mellitus   . 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 Hyperkalemia   . Hyperlipemia   . Hypertension   . Hypothyroidism   . Joint pain   . Joint swelling   . Obesity   . Renal insufficiency   . SOB (shortness of breath)   . Stroke ([place]) 09/12/2014  . Throat cancer ([place]) 2011   s/p neck dissection, radiation, chemo    Past Surgical History:  Procedure Laterality Date  . CARDIAC CATHETERIZATION    . NECK DISSECTION Right 2011   s/p resection of throat cancer with multiple nodes removed    There were no vitals filed for this visit.      Subjective Assessment - 03/13/17 0855    Subjective Reports not doing any  exercises on day of therapy due to exhaustion from getting back into house.    Patient is accompained by: Family member   Patient Stated Goals Pt's goal for therapy is to start back doing what I'm supposed to.   Currently in Pain? Yes   Pain Score 6    Pain Location Leg   Pain Orientation Left   Pain Descriptors / Indicators Throbbing   Pain Type Chronic pain   Pain Onset More than a month ago   Pain Frequency Intermittent   Aggravating Factors  sometimes    Pain Relieving Factors doing the exercises, stretches                          [facility] Adult PT Treatment/Exercise - 03/13/17 0001      Ambulation/Gait   Ambulation/Gait Yes   Ambulation/Gait Assistance 6: Modified independent (Device/Increase time);5: Supervision   Ambulation Distance (Feet) 115 Feet   Assistive device Rolling walker   Gait Pattern Step-through pattern;Decreased stride length;Trunk rotated posteriorly on left;Decreased weight shift to left;Lateral hip instability;Trunk flexed   Ambulation Surface Level;Indoor   Gait velocity .43 ft/sec    Gait Comments following gait had pt perform seated piriformis and seated hamstring stretch x 2 mins each.       Self-Care   Self-Care  Other Self-Care Comments   Other Self-Care Comments  Provided education regarding contacting [facility] regarding pain that brace is causing and also to replace toe cap.  Pt and spouse verbalized understanding.  The other recommendation that PT made was adding shoe button to L shoe in order to have pt keep brace/shoe on during the day but loosen/tighten independently as needed.  Provided shoe button during session and donned on shoe.  Also provided education regarding increasing activity at home as always with current HEP and how to incorporate exercises into walking as well.       Neuro Re-ed    Neuro Re-ed Details  Went over pelvic tilt exercise per pt and spouse request.  Pt continues to compensate with overt shoulder  movements rather than hip/pelvic movements.  Provided cues for this and then had pt sit on green balance disc and perform lateral tilts with facilitation with RUE to reach upward and out to side to increase amount of R lengthening and L trunk shortening.  Also did in the other direction, however PT held LUE to unweight him slightly (did not elevate as in RUE).  Performed several reps with improvement noted during session.                  PT Education - 03/13/17 1119    Education provided Yes   Education Details see self care   Person(s) Educated Patient   Methods Explanation;Demonstration   Comprehension Verbalized understanding;Returned demonstration          PT Short Term Goals - 02/10/17 2032      PT SHORT TERM GOAL #1   Title Same as LTGs = 5 weeks           PT Long Term Goals - 03/13/17 0857      PT LONG TERM GOAL #1   Title Pt will be independent with HEP to improve functional mobility, balance and gait.  TARGET 03/14/17   Time 5   Period Weeks   Status Achieved     PT LONG TERM GOAL #2   Title Pt will improve gait velocity to at least 0.8 ft/sec for decreased fall risk, improved efficiency of gait in the home.   Baseline .435 ft/sec with RW on 03/13/17   Time 5   Period Weeks   Status Not Met     PT LONG TERM GOAL #3   Title Pt will report no more than 4/10 pain (at worst) in back and LLE during gait in order to indicate that pain is decreased limiting factor in mobility.     Baseline 6/10 pain on 03/13/17   Time 5   Period Weeks   Status Not Met     PT LONG TERM GOAL #4   Title Pt will improve gait in 3 minute walk test to at least 100 ft for improved gait efficiency and safety in home environment.   Baseline 72 ft in 3 minutes 03/11/17   Time 5   Period Weeks   Status Not Met               Plan - 03/13/17 1120    Clinical Impression Statement Skilled session went over remaining LTGs and also education regarding brace wear, calling [facility]  to make any needed modifications to brace, and also to loosen while he is resting at home with use of shoe button.  Pt has met 1/4 LTGs due to lack of progress and non compliance with walking and HEP  at home.  PT to D/C at this time.    Rehab Potential Good   Clinical Impairments Affecting Rehab Potential chronicity of deficits, pt motivation   PT Frequency 2x / week   PT Duration 4 weeks  plus 1x/wk following eval   PT Treatment/Interventions ADLs/Self Care Home Management;Functional mobility training;Gait training;DME Instruction;Neuromuscular re-education;Patient/family education;Orthotic Fit/Training;Balance training;Therapeutic exercise;Therapeutic activities;Passive range of motion   Consulted and Agree with Plan of Care Patient;Family member/caregiver   Family Member Consulted wife      Patient will benefit from skilled therapeutic intervention in order to improve the following deficits and impairments:  Abnormal gait, Decreased activity tolerance, Decreased balance, Decreased mobility, Decreased coordination, Decreased range of motion, Decreased safety awareness, Difficulty walking, Decreased strength, Impaired flexibility, Postural dysfunction, Pain  Visit Diagnosis: Other abnormalities of gait and mobility  Muscle weakness (generalized)  Abnormal posture  Spastic hemiparesis of left nondominant side (HCC)    PHYSICAL THERAPY DISCHARGE SUMMARY  Visits from Start of Care: 10  Current functional level related to goals / functional outcomes: See LTGs above   Remaining deficits: Pt continues to have significant balance, strength and endurance deficits post CVA.  However pt has been in consistent with HEP and walking at home therefore has made minimal progress in PT.    Education / Equipment: HEP  Plan: Patient agrees to discharge.  Patient goals were not met. Patient is being discharged due to meeting the stated rehab goals.  ?????          Problem List Patient  Active Problem List   Diagnosis Date Noted  . Adjustment reaction with aggression   . 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 Gait disturbance, post-stroke 07/30/2016  . 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 Emotional lability 04/22/2016  . CKD (chronic kidney disease) 04/01/2016  . Spastic hemiplegia affecting nondominant side ([place]) 02/26/2016  . Hyperlipemia 01/17/2016  . BPH (benign prostatic hyperplasia) 10/16/2015  . 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 Hemiparesis affecting left side as late effect of cerebrovascular accident (CVA) ([place]) 11/08/2014  . Hypothyroidism 10/27/2014  . Right carotid artery occlusion   . Essential hypertension 09/12/2014  . Type 2 diabetes mellitus with peripheral neuropathy ([place]) 09/12/2014    Cameron Sprang, PT, MPT [facility] [address] [place], [place], [address] Phone: [phone]   Fax:  [phone] 03/13/17, 11:24 AM  Name: Brian Hall MRN: [id number] Date of Birth: 1956-07-25

## 2017-03-17 ENCOUNTER — Ambulatory Visit: Payer: BLUE CROSS/BLUE SHIELD | Admitting: Family Medicine

## 2017-03-17 ENCOUNTER — Ambulatory Visit (HOSPITAL_BASED_OUTPATIENT_CLINIC_OR_DEPARTMENT_OTHER): Payer: PPO | Admitting: Physical Medicine & Rehabilitation

## 2017-03-17 ENCOUNTER — Encounter: Payer: PPO | Attending: Physical Medicine & Rehabilitation

## 2017-03-17 ENCOUNTER — Encounter: Payer: Self-pay | Admitting: Physical Medicine & Rehabilitation

## 2017-03-17 VITALS — BP 111/73 | HR 75

## 2017-03-17 DIAGNOSIS — I63511 Cerebral infarction due to unspecified occlusion or stenosis of right middle cerebral artery: Secondary | ICD-10-CM | POA: Diagnosis not present

## 2017-03-17 DIAGNOSIS — G8110 Spastic hemiplegia affecting unspecified side: Secondary | ICD-10-CM | POA: Insufficient documentation

## 2017-03-17 DIAGNOSIS — R269 Unspecified abnormalities of gait and mobility: Secondary | ICD-10-CM

## 2017-03-17 DIAGNOSIS — Z8673 Personal history of transient ischemic attack (TIA), and cerebral infarction without residual deficits: Secondary | ICD-10-CM

## 2017-03-17 DIAGNOSIS — I69398 Other sequelae of cerebral infarction: Secondary | ICD-10-CM | POA: Diagnosis not present

## 2017-03-17 DIAGNOSIS — M6281 Muscle weakness (generalized): Secondary | ICD-10-CM | POA: Diagnosis not present

## 2017-03-17 NOTE — Patient Instructions (Addendum)
May use heating pad and Suezanne Jacquet gay for low back pain  Please do home exercise program on a daily basis!

## 2017-03-17 NOTE — Progress Notes (Signed)
Subjective:    Patient ID: Brian Hall, male    DOB: 1955/09/13, 60 y.o.   MRN: [id number]  HPI  Patient underwent Botox injections 02/03/2017 Biceps 75 Brachialis 50 Brachioradialis 25 FCR 25 FDS 50 FDP 50. FPL 25  Finishing up PT Poor compliance with HEP per patient's wife. He has been encouraged to ambulate to the bathroom with a walker, which he has not been doing. Seeing Dr Sima Matas for Neuropsych PCP started pt on Bupropion Pain Inventory Average Pain 7 Pain Right Now 5 My pain is intermittent, burning and aching  In the last 24 hours, has pain interfered with the following? General activity 9 Relation with others 9 Enjoyment of life 9 What TIME of day is your pain at its worst? daytime Sleep (in general) Fair  Pain is worse with: walking and standing Pain improves with: rest and medication Relief from Meds: 5  Mobility walk with assistance use a walker ability to climb steps?  no do you drive?  no use a wheelchair transfers alone  Function disabled: date disabled 2016 I need assistance with the following:  feeding, dressing, bathing, toileting, meal prep, household duties and shopping  Neuro/Psych trouble walking confusion depression  Prior Studies Any changes since last visit?  no  Physicians involved in your care Any changes since last visit?  no   Family History  Problem Relation Age of Onset  . Hypertension Mother   . Hypertension Father   . Stroke Father   . Stroke Sister   . Arthritis/Rheumatoid Brother    Social History   Social History  . Marital status: Married    Spouse name: N/A  . Number of children: 1  . Years of education: 61   Occupational History  . truck driver, retired    Social History Main Topics  . Smoking status: Never Smoker  . Smokeless tobacco: Never Used  . Alcohol use No  . Drug use: No  . Sexual activity: Not on file   Other Topics Concern  . Not on file   Social History Narrative   Married, 1 daughter   Right handed   Caffeine use - rare tea      Past Surgical History:  Procedure Laterality Date  . CARDIAC CATHETERIZATION    . NECK DISSECTION Right 2011   s/p resection of throat cancer with multiple nodes removed   Past Medical History:  Diagnosis Date  . Blurred vision   . Cardiac LV ejection fraction 10-20%   . Cardiogenic shock ([place])   . CHF (congestive heart failure) ([place])   . Diabetes mellitus   . 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 Hyperkalemia   . Hyperlipemia   . Hypertension   . Hypothyroidism   . Joint pain   . Joint swelling   . Obesity   . Renal insufficiency   . SOB (shortness of breath)   . Stroke ([place]) 09/12/2014  . Throat cancer ([place]) 2011   s/p neck dissection, radiation, chemo   There were no vitals taken for this visit.  Opioid Risk Score:   Fall Risk Score:  `1  Depression screen PHQ 2/9  Depression screen [facility] 2/9 02/17/2017 01/06/2017 12/20/2016 12/06/2016 07/29/2016 04/22/2016 04/01/2016  Decreased Interest 3 3 1 2 2 2  0  Down, Depressed, Hopeless 2 0 1 1 1 3  0  PHQ - 2 Score 5 3 2 3 3 5  0  Altered sleeping 3 0 1 2 1 1  -  Tired,  decreased energy 3 0 1 1 1 1  -  Change in appetite 1 1 1 1 1 1  -  Feeling bad or failure about yourself  3 1 0 1 1 0 -  Trouble concentrating 3 0 0 1 0 0 -  Moving slowly or fidgety/restless 2 0 1 0 0 0 -  Suicidal thoughts 2 0 0 1 1 1  -  PHQ-9 Score 22 5 6 10 8 9  -  Difficult doing work/chores - - - - - Somewhat difficult -  Some recent data might be hidden     Review of Systems  Constitutional: Positive for unexpected weight change.  HENT: Negative.   Eyes: Negative.   Respiratory: Negative.   Cardiovascular: Negative.   Gastrointestinal: Negative.   Endocrine: Negative.   Genitourinary: Negative.   Musculoskeletal: Negative.   Skin: Negative.   Allergic/Immunologic: Negative.   Neurological: Negative.   Hematological: Bruises/bleeds easily.  Psychiatric/Behavioral:  Negative.   All other systems reviewed and are negative.      Objective:   Physical Exam  Constitutional: He is oriented to person, place, and time. He appears well-developed and well-nourished.  HENT:  Head: Normocephalic and atraumatic.  Eyes: Pupils are equal, round, and reactive to light. Conjunctivae and EOM are normal.  Neck: Normal range of motion.  Neurological: He is alert and oriented to person, place, and time. Gait abnormal.  Drags left foot during swing phase Abnormal wear on the toe CAP left shoe  Motor strength is 2 minus at the finger flexors. Elbow flexor and shoulder adductor on the left side. Minus, knee extensor on the left side. 5/5 strength in the right side. Tone is Ashworth grade 2 in the finger flexors and thumb flexor on the left. Ashworth, 0 at the elbow flexor and Ashworth 1 at the wrist flexor  Psychiatric: He has a normal mood and affect.  Nursing note and vitals reviewed.         Assessment & Plan:  1. Spastic hemiplegia, left side secondary to right MCA infarct, good results with Botox at the current dosage. We'll repeat same dosing in 6 weeks.  We also discussed his home exercise program and encouraged him to continue this. Even though he is not getting one-on-one physical therapy anymore  2. Post stroke depression with poor adjustment, follow up with neuropsych, PCP started bupropion

## 2017-03-19 ENCOUNTER — Ambulatory Visit (INDEPENDENT_AMBULATORY_CARE_PROVIDER_SITE_OTHER): Payer: PPO | Admitting: Family Medicine

## 2017-03-19 ENCOUNTER — Encounter: Payer: Self-pay | Admitting: Family Medicine

## 2017-03-19 VITALS — BP 100/65 | HR 73 | Ht 71.0 in | Wt 238.0 lb

## 2017-03-19 DIAGNOSIS — E038 Other specified hypothyroidism: Secondary | ICD-10-CM | POA: Diagnosis not present

## 2017-03-19 DIAGNOSIS — N189 Chronic kidney disease, unspecified: Secondary | ICD-10-CM | POA: Diagnosis not present

## 2017-03-19 DIAGNOSIS — L219 Seborrheic dermatitis, unspecified: Secondary | ICD-10-CM

## 2017-03-19 DIAGNOSIS — E782 Mixed hyperlipidemia: Secondary | ICD-10-CM | POA: Diagnosis not present

## 2017-03-19 DIAGNOSIS — F0631 Mood disorder due to known physiological condition with depressive features: Secondary | ICD-10-CM | POA: Diagnosis not present

## 2017-03-19 DIAGNOSIS — I69354 Hemiplegia and hemiparesis following cerebral infarction affecting left non-dominant side: Secondary | ICD-10-CM

## 2017-03-19 DIAGNOSIS — E1142 Type 2 diabetes mellitus with diabetic polyneuropathy: Secondary | ICD-10-CM

## 2017-03-19 DIAGNOSIS — R4586 Emotional lability: Secondary | ICD-10-CM

## 2017-03-19 DIAGNOSIS — I69398 Other sequelae of cerebral infarction: Secondary | ICD-10-CM | POA: Diagnosis not present

## 2017-03-19 LAB — BAYER DCA HB A1C WAIVED: HB A1C (BAYER DCA - WAIVED): 6.2 % (ref <7.0)

## 2017-03-19 MED ORDER — MOMETASONE FUROATE 0.1 % EX CREA: 1.0000 "application " | TOPICAL_CREAM | Freq: Every day | CUTANEOUS | 5 refills | Status: AC

## 2017-03-19 NOTE — Patient Instructions (Signed)
Hypoglycemia Hypoglycemia occurs when the level of sugar (glucose) in the blood is too low. Glucose is a type of sugar that provides the body's main source of energy. Certain hormones (insulin and glucagon) control the level of glucose in the blood. Insulin lowers blood glucose, and glucagon increases blood glucose. Hypoglycemia can result from having too much insulin in the bloodstream, or from not eating enough food that contains glucose. Hypoglycemia can happen in people who do or do not have diabetes. It can develop quickly, and it can be a medical emergency. What are the causes? Hypoglycemia occurs most often in people who have diabetes. If you have diabetes, hypoglycemia may be caused by:  Diabetes medicine.  Not eating enough, or not eating often enough.  Increased physical activity.  Drinking alcohol, especially when you have not eaten recently.  What are the signs or symptoms? Hypoglycemia may not cause any symptoms. If you have symptoms, they may include:  Hunger.  Anxiety.  Sweating and feeling clammy.  Confusion.  Dizziness or feeling light-headed.  Sleepiness.  Nausea.  Increased heart rate.  Headache.  Blurry vision.  Seizure.  Nightmares.  Tingling or numbness around the mouth, lips, or tongue.  A change in speech.  Decreased ability to concentrate.  A change in coordination.  Restless sleep.  Tremors or shakes.  Fainting.  Irritability.  How is this treated? This condition can often be treated by immediately eating or drinking something that contains glucose, such as:  3-4 sugar tablets (glucose pills).  Glucose gel, 15-gram tube.  Fruit juice, 4 oz (120 mL).  Regular soda (not diet soda), 4 oz (120 mL).  Low-fat milk, 4 oz (120 mL).  Several pieces of hard candy.  Sugar or honey, 1 Tbsp.  Treating Hypoglycemia If You Have Diabetes  If you are alert and able to swallow safely, follow the 15:15 rule:  Take 15 grams of a  rapid-acting carbohydrate. Rapid-acting options include: ? 1 tube of glucose gel. ? 3 glucose pills. ? 6-8 pieces of hard candy. ? 4 oz (120 mL) of fruit juice.  Check your blood glucose 15 minutes after you take the carbohydrate.  If the repeat blood glucose level is still at or below 70 mg/dL (3.9 mmol/L), take 15 grams of a carbohydrate again.  If your blood glucose level does not increase above 70 mg/dL (3.9 mmol/L) after 3 tries, seek emergency medical care.  After your blood glucose level returns to normal, eat a meal or a snack within 1 hour.  Treating Severe Hypoglycemia Severe hypoglycemia is when your blood glucose level is at or below 54 mg/dL (3 mmol/L). Severe hypoglycemia is an emergency. Do not wait to see if the symptoms will go away. Get medical help right away. Call your local emergency services (911 in the [place] Do not drive yourself to the hospital. If you have severe hypoglycemia and you cannot eat or drink, you may need an injection of glucagon. A family member or close friend should learn how to check your blood glucose and how to give you a glucagon injection. Ask your health care provider if you need to have an emergency glucagon injection kit available. Severe hypoglycemia may need to be treated in a hospital. The treatment may include getting glucose through an IV tube. You may also need treatment for the cause of your hypoglycemia. Follow these instructions at home: General instructions  Avoid any diets that cause you to not eat enough food. Talk with your health care provider  before you start any new diet.  Take over-the-counter and prescription medicines only as told by your health care provider.  Limit alcohol intake to no more than 1 drink per day for nonpregnant women and 2 drinks per day for men. One drink equals 12 oz of beer, 5 oz of wine, or 1 oz of hard liquor.  Keep all follow-up visits as told by your health care provider. This is important. If  You Have Diabetes:   Make sure you know the symptoms of hypoglycemia.  Always have a rapid-acting carbohydrate snack with you to treat low blood sugar.  Follow your diabetes management plan, as told by your health care provider. Make sure you: ? Take your medicines as directed. ? Follow your exercise plan. ? Follow your meal plan. Eat on time, and do not skip meals. ? Check your blood glucose as often as directed. Make sure to check your blood glucose before and after exercise. If you exercise longer or in a different way than usual, check your blood glucose more often. ? Follow your sick day plan whenever you cannot eat or drink normally. Make this plan in advance with your health care provider.  Share your diabetes management plan with people in your workplace, school, and household.  Check your urine for ketones when you are ill and as told by your health care provider.  Carry a medical alert card or wear medical alert jewelry. If You Have Reactive Hypoglycemia or Low Blood Sugar From Other Causes:  Monitor your blood glucose as told by your health care provider.  Follow instructions from your health care provider about eating or drinking restrictions. Contact a health care provider if:  You have problems keeping your blood glucose in your target range.  You have frequent episodes of hypoglycemia. Get help right away if:  You continue to have hypoglycemia symptoms after eating or drinking something containing glucose.  Your blood glucose is at or below 54 mg/dL (3 mmol/L).  You have a seizure.  You faint. These symptoms may represent a serious problem that is an emergency. Do not wait to see if the symptoms will go away. Get medical help right away. Call your local emergency services (911 in the [place] Do not drive yourself to the hospital. This information is not intended to replace advice given to you by your health care provider. Make sure you discuss any questions you  have with your health care provider. Document Released: 08/19/2005 Document Revised: 01/31/2016 Document Reviewed: 09/22/2015 Elsevier Interactive Patient Education  [employer].

## 2017-03-19 NOTE — Progress Notes (Signed)
Subjective:  Patient ID: Brian Hall, male    DOB: 1956-08-19  Age: 61 y.o. MRN: [id number]  CC: Depression (pt here today for follow up on his depression)   HPI Brian Hall presents forFollow-up of diabetes. Patient checks blood sugar at home.  Patient denies symptoms such as polyuria, polydipsia, excessive hunger, nausea Has had multiple significant hypoglycemic spells noted on his log sheet. On a couple of occasions he had to have multiple boluses of sugar usually with ice cream milk potatoes etc. On one occasion the used to glucose tablets and some syrup as well as potatoes and tea and ice cream. Each time he came up safely the patient denies any complications other than just feeling shaky and nauseous. His wife states he's had very little appetite for the last couple of months. See attached log. Medications reviewed. Pt reports taking them regularly without complication/adverse reaction being reported today. However he is not going to be able to take the Onglyza anymore because with a change in insurance the price has gone up to $500 monthly. Patient requests hepatitis C testing today.  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   He has a past surgical history that includes Cardiac catheterization and Neck dissection (Right, 2011).   His family history includes Arthritis/Rheumatoid in his brother; Hypertension in his father and mother; Stroke in his father and sister.He reports that he has never smoked. He has never used smokeless tobacco. He reports that he does not drink alcohol or use  drugs.  Current Outpatient Prescriptions on File Prior to Visit  Medication Sig Dispense Refill  . amLODipine (NORVASC) 5 MG tablet take 1 tablet by mouth once daily for BLOOD PRESSURE 30 tablet 4  . ARIPiprazole (ABILIFY) 15 MG tablet Take 1 tablet (15 mg total) by mouth daily. 30 tablet 2  . buPROPion (WELLBUTRIN XL) 150 MG 24 hr tablet One daily for one week, then two daily 60 tablet 1  . carvedilol (COREG) 25 MG tablet take 1 tablet by mouth twice a day 60 tablet 2  . cholecalciferol (VITAMIN D) 400 UNITS TABS tablet Take 400 Units by mouth daily.    . clopidogrel (PLAVIX) 75 MG tablet take 1 tablet by mouth daily 30 tablet 2  . gabapentin (NEURONTIN) 300 MG capsule take 1 capsule by mouth three times a day 90 capsule 3  . glimepiride (AMARYL) 4 MG tablet Take 4 mg by mouth daily with breakfast.  0  . levothyroxine (SYNTHROID, LEVOTHROID) 100 MCG tablet Take 100 mcg by mouth daily before breakfast.  0  . lisinopril (PRINIVIL,ZESTRIL) 40 MG tablet take 1 tablet by mouth daily 30 tablet 5  . metFORMIN (GLUMETZA) 1000 MG (MOD) 24 hr tablet Take 1 tablet (1,000 mg total) by mouth daily with breakfast. 30 tablet 5  . Multiple Vitamins-Minerals (MULTIVITAMIN WITH MINERALS) tablet Take 1 tablet by mouth daily.    . Naproxen Sodium (ALEVE) 220 MG CAPS Take 2 capsules by mouth 2 (two) times daily.    Marland Kitchen omeprazole (PRILOSEC) 40 MG capsule take 1 capsule by mouth once daily 30 capsule 5  . ONGLYZA 5 MG TABS tablet take 1 tablet by mouth daily 30 tablet 1  . rosuvastatin (  CRESTOR) 20 MG tablet Take 20 mg by mouth daily.  0  . tiZANidine (ZANAFLEX) 2 MG tablet Take 1 tablet (2 mg total) by mouth 3 (three) times daily. 90 tablet 2  . traMADol (ULTRAM) 50 MG tablet take 1 tablet by mouth every 6 hours if needed for MODERATE PAIN 60 tablet 3   No current facility-administered medications on file prior to visit.     ROS Review of Systems  Constitutional: Positive for activity change (just sits in his  recliner all day), appetite change (markedly decreased) and unexpected weight change (8 poundsPresumptively related to his decreased appetite). Negative for chills, diaphoresis and fever.  HENT: Negative for congestion, hearing loss, rhinorrhea and sore throat.   Eyes: Negative for visual disturbance.  Respiratory: Negative for cough and shortness of breath.   Cardiovascular: Negative for chest pain.  Gastrointestinal: Negative for abdominal pain, constipation and diarrhea.  Genitourinary: Negative for dysuria and flank pain.  Musculoskeletal: Positive for gait problem.  Skin: Negative for rash.  Neurological: Positive for weakness. Negative for dizziness and headaches.  Psychiatric/Behavioral: Positive for decreased concentration. Negative for dysphoric mood and sleep disturbance.    Objective:  BP 100/65   Pulse 73   Ht _0  (1.803 m)   Wt 238 lb (108 kg)   BMI 33.19 kg/m   BP Readings from Last 3 Encounters:  03/19/17 100/65  03/17/17 111/73  02/17/17 (!) 90/54    Wt Readings from Last 3 Encounters:  03/19/17 238 lb (108 kg)  02/17/17 246 lb (111.6 kg)  02/05/17 250 lb (113.4 kg)     Physical Exam  Constitutional: He is oriented to person, place, and time. He appears well-developed and well-nourished. No distress.  Patient has glazed, overmedicated look  Cardiovascular: Normal rate, regular rhythm and normal heart sounds.   No murmur heard. Musculoskeletal: He exhibits no edema or tenderness.  Neurological: He is alert and oriented to person, place, and time. No cranial nerve deficit. He exhibits abnormal muscle tone (left hemiparesis).  Skin: Skin is warm and dry. Rash (Thick white tube age scaling in the nasolabial folds and in the beard area as well as at the hairline) noted. He is not diaphoretic.  Psychiatric: Thought content normal. His affect is blunt. His speech is delayed. He is slowed and withdrawn. Cognition and memory are normal. He exhibits a depressed  mood.    No components found for: BAYER     Assessment & Plan:   Brian Hall was seen today for depression.  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 To affected areas   His glucose is running lower than I would like to see. His A1c is 6.2 today. Because of this and have him quit taking the Onglyza due to its cost. Although I prefer this medicine  to the glimepiride, at least the glimepiride is affordable. We reviewed treatments for hypoglycemia as well as symptoms to monitor for. Patient was encouraged to improve his nutrition as well.   I am having Mr. Grisby start on mometasone. I am also having him maintain his multivitamin with minerals, cholecalciferol, Naproxen Sodium, lisinopril, traMADol, gabapentin, tiZANidine, metFORMIN, rosuvastatin, levothyroxine, clopidogrel, omeprazole, glimepiride, ARIPiprazole, carvedilol, amLODipine, buPROPion, and ONGLYZA.  I believe he is overmedicated likely either from the Abilify or the Wellbutrin. He is seeing his psychiatrist in just 2 days. Therefore I'll hold off on making changes and defer to him.   Follow-up: Return in about 1 month (around 04/19/2017).  Claretta Fraise, M.D.

## 2017-03-20 LAB — CMP14+EGFR
A/G RATIO: 1.5 (ref 1.2–2.2)
ALBUMIN: 4.2 g/dL (ref 3.5–5.5)
ALT: 25 IU/L (ref 0–44)
AST: 23 IU/L (ref 0–40)
Alkaline Phosphatase: 76 IU/L (ref 39–117)
BUN/Creatinine Ratio: 15 (ref 9–20)
BUN: 24 mg/dL (ref 6–24)
Bilirubin Total: 0.5 mg/dL (ref 0.0–1.2)
CALCIUM: 9.6 mg/dL (ref 8.7–10.2)
CO2: 22 mmol/L (ref 20–29)
Chloride: 104 mmol/L (ref 96–106)
Creatinine, Ser: 1.58 mg/dL — ABNORMAL HIGH (ref 0.76–1.27)
GFR, EST AFRICAN AMERICAN: 55 mL/min/{1.73_m2} — AB (ref >59)
GFR, EST NON AFRICAN AMERICAN: 48 mL/min/{1.73_m2} — AB (ref >59)
Globulin, Total: 2.8 g/dL (ref 1.5–4.5)
Glucose: 66 mg/dL (ref 65–99)
Potassium: 4.6 mmol/L (ref 3.5–5.2)
Sodium: 142 mmol/L (ref 134–144)
TOTAL PROTEIN: 7 g/dL (ref 6.0–8.5)

## 2017-03-20 LAB — TSH+FREE T4
Free T4: 1.57 ng/dL (ref 0.82–1.77)
TSH: 4.95 u[IU]/mL — ABNORMAL HIGH (ref 0.450–4.500)

## 2017-03-20 LAB — HEPATITIS C ANTIBODY

## 2017-03-21 ENCOUNTER — Encounter (HOSPITAL_BASED_OUTPATIENT_CLINIC_OR_DEPARTMENT_OTHER): Payer: PPO | Admitting: Psychology

## 2017-03-21 DIAGNOSIS — R269 Unspecified abnormalities of gait and mobility: Secondary | ICD-10-CM

## 2017-03-21 DIAGNOSIS — I69398 Other sequelae of cerebral infarction: Secondary | ICD-10-CM | POA: Diagnosis not present

## 2017-03-21 DIAGNOSIS — F4321 Adjustment disorder with depressed mood: Secondary | ICD-10-CM | POA: Diagnosis not present

## 2017-03-21 DIAGNOSIS — G8110 Spastic hemiplegia affecting unspecified side: Secondary | ICD-10-CM

## 2017-03-21 DIAGNOSIS — Z8673 Personal history of transient ischemic attack (TIA), and cerebral infarction without residual deficits: Secondary | ICD-10-CM | POA: Diagnosis not present

## 2017-03-21 DIAGNOSIS — I63511 Cerebral infarction due to unspecified occlusion or stenosis of right middle cerebral artery: Secondary | ICD-10-CM | POA: Diagnosis not present

## 2017-03-24 ENCOUNTER — Other Ambulatory Visit: Payer: Self-pay | Admitting: Family Medicine

## 2017-03-24 ENCOUNTER — Telehealth: Payer: Self-pay | Admitting: Neurology

## 2017-03-24 NOTE — Telephone Encounter (Signed)
Message sent to Dr. Leonie Man for patient MRI and MRA results.

## 2017-03-24 NOTE — Telephone Encounter (Signed)
Patients wife [name] (listed on DPR) called office requesting results of MRA/MRI.  Please call

## 2017-03-27 NOTE — Telephone Encounter (Signed)
I called the listed number and left a message for the patient's wife to call me back and stated that the MRs scans did not show anything new or worrisome

## 2017-03-28 NOTE — Telephone Encounter (Signed)
I called and spoke to the patient's wife and communicated results of MRI scan of the brain, MRA of the brain and MRA of the neck and she voiced understanding.

## 2017-04-01 ENCOUNTER — Encounter: Payer: Self-pay | Admitting: Psychology

## 2017-04-01 NOTE — Progress Notes (Signed)
Patient:  Brian Hall   DOB: 06/13/1956  MR Number: [id number]  Location: [facility] [address] [place] [address] Dept: [phone]  Start: 8 AM End: 9 AM  Provider/Observer:     Edgardo Roys PSYD  Chief Complaint:      Chief Complaint  Patient presents with  . Agitation  . Depression  . Stress    Reason For Service:     The patient is a 61 year old Caucasian male that presented to the emergency department in September 12, 2014 with left-sided weakness and slurred speech. MRI of the brain showed a large acute tertiary right MCA tertiary infarct involving the right frontal lobe as well as remote infarcts of the left medial occipital lobe and left cerebellar hemisphere.  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 The patient was treated in the inpatient rehabilitation unit and was discharged with follow-up care with physical medicine and Dr. Letta Pate.  The patient has continued to have some significant adjustment issues and difficulties. Psychosocial stressors related to the relationship between the patient is wife as well as his stepson created complicated situation for him to cope with along with his physical limitations of left side hemiparesis. The patient was referred for neuropsychological interventions and we'll initially work on building coping skills around the adjustment issues.  Interventions Strategy:  Cognitive Behavioral interventions and coping skills building for recovery form CVA  Participation Level:   Active  Participation Quality:  Appropriate and Attentive      Behavioral Observation:  Well Groomed, Alert, and Appropriate.   Current Psychosocial Factors: The patient reports along with wife that they are doing well living together again.  She is still Frustrated that her son will not have anything to do  with her and the patient now that her husband is back living at the house. However, we continue to work on these family issues and adjustment issues. The patient is still dealing with anger and difficulty going out in public. Use these issues to work on therapeutic intervention.     Content of Session:   Reveiwed current symptoms and worked on Therapist, occupational  Current Status:   The patient reports that he has still had considerable gait issues and trouble coping post stroke.  The patient has been avoiding any activities to go outside.  Patient Progress:   Stable  Target Goals:   Working on Therapist, occupational due to medical issues following CVA  Last Reviewed:   03/21/2017  Goals Addressed Today:    Worked on coping and adaptive skills  Impression/Diagnosis:   The patient is status post MCA infarct of Right Hemisphere with near total loss of function in right arm and leg.  Patient denies significant change in cognitive function, but he was quite labile in his emotion that may or may not have an organic factor to it.  Will keep assessing that.  Diagnosis:   History of ischemic right MCA stroke  Spastic hemiplegia affecting nondominant side (HCC)  Gait disturbance, post-stroke  Adjustment disorder with depressed mood

## 2017-04-20 ENCOUNTER — Other Ambulatory Visit: Payer: Self-pay | Admitting: Family Medicine

## 2017-04-21 ENCOUNTER — Telehealth: Payer: Self-pay | Admitting: *Deleted

## 2017-04-21 ENCOUNTER — Encounter: Payer: Self-pay | Admitting: Family Medicine

## 2017-04-21 ENCOUNTER — Ambulatory Visit (INDEPENDENT_AMBULATORY_CARE_PROVIDER_SITE_OTHER): Payer: PPO | Admitting: Family Medicine

## 2017-04-21 VITALS — BP 106/68 | HR 69 | Temp 97.2°F | Ht 71.0 in | Wt 233.0 lb

## 2017-04-21 DIAGNOSIS — R4586 Emotional lability: Secondary | ICD-10-CM

## 2017-04-21 DIAGNOSIS — F0631 Mood disorder due to known physiological condition with depressive features: Secondary | ICD-10-CM | POA: Diagnosis not present

## 2017-04-21 DIAGNOSIS — N189 Chronic kidney disease, unspecified: Secondary | ICD-10-CM | POA: Diagnosis not present

## 2017-04-21 DIAGNOSIS — I69398 Other sequelae of cerebral infarction: Secondary | ICD-10-CM

## 2017-04-21 DIAGNOSIS — E1142 Type 2 diabetes mellitus with diabetic polyneuropathy: Secondary | ICD-10-CM

## 2017-04-21 DIAGNOSIS — I69354 Hemiplegia and hemiparesis following cerebral infarction affecting left non-dominant side: Secondary | ICD-10-CM | POA: Diagnosis not present

## 2017-04-21 DIAGNOSIS — F4329 Adjustment disorder with other symptoms: Secondary | ICD-10-CM | POA: Diagnosis not present

## 2017-04-21 DIAGNOSIS — E161 Other hypoglycemia: Secondary | ICD-10-CM | POA: Diagnosis not present

## 2017-04-21 DIAGNOSIS — I10 Essential (primary) hypertension: Secondary | ICD-10-CM

## 2017-04-21 LAB — URINALYSIS
Bilirubin, UA: NEGATIVE
Glucose, UA: NEGATIVE
Ketones, UA: NEGATIVE
Nitrite, UA: NEGATIVE
Specific Gravity, UA: <1.005 — ABNORMAL LOW (ref 1.005–1.030)
Urobilinogen, Ur: 1 mg/dL (ref 0.2–1.0)
pH, UA: 5.5 (ref 5.0–7.5)

## 2017-04-21 NOTE — Addendum Note (Signed)
Addended by: Marylin Crosby on: 04/21/2017 11:45 AM   Modules accepted: Orders

## 2017-04-21 NOTE — Progress Notes (Signed)
Subjective:  Patient ID: Brian Hall, male    DOB: 02/20/1956  Age: 61 y.o. MRN: [id number]  CC: Follow-up (pt here today for routine follow up and this morning had his BS drop in the 40's this morning and they called EMS and he refused to go to the hospital and was made to sign papers  by EMS for refusal.)   HPI Brian Hall presents forFollow-up of diabetes.Continues to have significant hypoglycemic spells.Glucose log is attached. His fasting sugars appear to be between 90 and 120. Occasionally bit higher. Of concern is that for the last 3 mornings his sugar has been 6052 and 43 respectively it took several boluses of oral glucose to bring him up from 43 and 47 and 57 and finally 81 this morning before presenting here. He is now seeing Dr. Jefm Miles for his adjustment reaction with aggression. He continues to have left-sided weakness and spasticity. Continues to get his Botox to prevent spasticity  follow-up of hypertension. Patient has no history of headache chest pain or shortness of breath or recent cough. Patient denies side effects from his medication. States taking it regularly.   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   He has a past surgical history that includes Cardiac catheterization and Neck dissection (Right, 2011).   His family history includes Arthritis/Rheumatoid in his brother; Hypertension in his father and mother; Stroke in his father and sister.He reports that he has never smoked. He has never used smokeless tobacco. He reports that he does not drink alcohol or use drugs.  Current  Outpatient Prescriptions on File Prior to Visit  Medication Sig Dispense Refill  . amLODipine (NORVASC) 5 MG tablet take 1 tablet by mouth once daily for BLOOD PRESSURE 30 tablet 4  . ARIPiprazole (ABILIFY) 15 MG tablet Take 1 tablet (15 mg total) by mouth daily. 30 tablet 2  . buPROPion (WELLBUTRIN XL) 150 MG 24 hr tablet TAKE 1 TABLET BY MOUTH ONCE DAILY FOR 1 WEEK THEN 2 TABLETS DAILY 60 tablet 0  . carvedilol (COREG) 25 MG tablet take 1 tablet by mouth twice a day 60 tablet 2  . cholecalciferol (VITAMIN D) 400 UNITS TABS tablet Take 400 Units by mouth daily.    . clopidogrel (PLAVIX) 75 MG tablet take 1 tablet by mouth daily 30 tablet 2  . gabapentin (NEURONTIN) 300 MG capsule take 1 capsule by mouth three times a day 90 capsule 3  . levothyroxine (SYNTHROID, LEVOTHROID) 100 MCG tablet Take 100 mcg by mouth daily before breakfast.  0  . lisinopril (PRINIVIL,ZESTRIL) 40 MG tablet TAKE 1 TABLET BY MOUTH ONCE DAILY 30 tablet 5  . metFORMIN (GLUMETZA) 1000 MG (MOD) 24 hr tablet Take 1 tablet (1,000 mg total) by mouth daily with breakfast. 30 tablet 5  . mometasone (ELOCON) 0.1 % cream Apply 1 application topically daily. To affected areas 45 g 5  . Multiple Vitamins-Minerals (MULTIVITAMIN WITH MINERALS) tablet Take 1 tablet by mouth daily.    . Naproxen Sodium (ALEVE) 220 MG CAPS Take 2 capsules by mouth 2 (two) times daily.    Marland Kitchen omeprazole (PRILOSEC) 40 MG capsule take 1 capsule by mouth once daily 30 capsule 5  . rosuvastatin (CRESTOR) 20  MG tablet Take 20 mg by mouth daily.  0  . tiZANidine (ZANAFLEX) 2 MG tablet Take 1 tablet (2 mg total) by mouth 3 (three) times daily. 90 tablet 2  . traMADol (ULTRAM) 50 MG tablet take 1 tablet by mouth every 6 hours if needed for MODERATE PAIN 60 tablet 3   No current facility-administered medications on file prior to visit.     ROS Review of Systems  Skin: Positive for rash (some redness and possible skin breakdown at the sacrum. Patient  wheelchair-bound and stays in the chair most of the time).    Objective:  BP 106/68   Pulse 69   Temp (!) 97.2 F (36.2 C) (Oral)   Ht 5\' 11"  (1.803 m)   Wt 233 lb (105.7 kg)   BMI 32.50 kg/m   BP Readings from Last 3 Encounters:  04/21/17 106/68  03/19/17 100/65  03/17/17 111/73    Wt Readings from Last 3 Encounters:  04/21/17 233 lb (105.7 kg)  03/19/17 238 lb (108 kg)  02/17/17 246 lb (111.6 kg)     Physical Exam  Constitutional: He is oriented to person, place, and time. He appears well-developed and well-nourished. No distress.  HENT:  Head: Normocephalic and atraumatic.  Nose: Nose normal.  Mouth/Throat: Oropharynx is clear and moist.  Eyes: Pupils are equal, round, and reactive to light. Conjunctivae and EOM are normal.  Neck: Normal range of motion. Neck supple. No thyromegaly present.  Cardiovascular: Normal rate, regular rhythm and normal heart sounds.   No murmur heard. Pulmonary/Chest: Effort normal and breath sounds normal. No respiratory distress. He has no wheezes. He has no rales.  Abdominal: Soft. Bowel sounds are normal. He exhibits no distension. There is no tenderness.  Lymphadenopathy:    He has no cervical adenopathy.  Neurological: He is alert and oriented to person, place, and time.  Skin: Skin is warm and dry.  Psychiatric: He has a normal mood and affect. His behavior is normal. Thought content normal.    No components found for: BAYER     Assessment & Plan:   Brian Hall was seen today for follow-up.  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 Moisturizing and padding and changing position  frequently recommended  I have discontinued Mr. Sedlack's glimepiride and ONGLYZA. I am also having him maintain his multivitamin with minerals, cholecalciferol, Naproxen Sodium, traMADol, gabapentin, tiZANidine, metFORMIN, rosuvastatin, levothyroxine, clopidogrel, omeprazole, ARIPiprazole, carvedilol, amLODipine, mometasone, lisinopril, and buPROPion.  No orders of the defined types were placed in this encounter.    Follow-up: Return in about 2 months (around 06/21/2017).  Claretta Fraise, M.D.

## 2017-04-21 NOTE — Patient Instructions (Signed)
..   Medications Discontinued During This Encounter  Medication Reason  . ONGLYZA 5 MG TABS tablet Patient has not taken in last 30 days  . glimepiride (AMARYL) 4 MG tablet Side effect (s)

## 2017-04-21 NOTE — Telephone Encounter (Signed)
Incoming call from pt's wife Pt very confused and aggressive this AM Wife reports BS reading of 40 Wife gave pt orange juice and pimento cheese Checked BS 45 minutes latter reading was 58 Pt still very aggressive and confused Pt is not ambulatory and wife is unable to bring pt in Due to pt's history and reported confusion with aggression, wife instructed to call 911

## 2017-04-22 ENCOUNTER — Encounter: Payer: PPO | Attending: Physical Medicine & Rehabilitation | Admitting: Psychology

## 2017-04-22 ENCOUNTER — Encounter: Payer: Self-pay | Admitting: Psychology

## 2017-04-22 DIAGNOSIS — I69354 Hemiplegia and hemiparesis following cerebral infarction affecting left non-dominant side: Secondary | ICD-10-CM

## 2017-04-22 DIAGNOSIS — F4329 Adjustment disorder with other symptoms: Secondary | ICD-10-CM

## 2017-04-22 DIAGNOSIS — G8110 Spastic hemiplegia affecting unspecified side: Secondary | ICD-10-CM | POA: Diagnosis not present

## 2017-04-22 DIAGNOSIS — I63511 Cerebral infarction due to unspecified occlusion or stenosis of right middle cerebral artery: Secondary | ICD-10-CM | POA: Diagnosis not present

## 2017-04-22 DIAGNOSIS — Z8673 Personal history of transient ischemic attack (TIA), and cerebral infarction without residual deficits: Secondary | ICD-10-CM | POA: Diagnosis not present

## 2017-04-22 DIAGNOSIS — F4321 Adjustment disorder with depressed mood: Secondary | ICD-10-CM

## 2017-04-22 DIAGNOSIS — R269 Unspecified abnormalities of gait and mobility: Secondary | ICD-10-CM | POA: Diagnosis not present

## 2017-04-22 DIAGNOSIS — I69398 Other sequelae of cerebral infarction: Secondary | ICD-10-CM | POA: Diagnosis not present

## 2017-04-22 NOTE — Progress Notes (Signed)
Patient:  Brian Hall   DOB: September 09, 1955  MR Number: [id number]  Location: [facility] [address] [place] [id number] [place] [address] Dept: [phone]  Start: 11 AMM End: 12 PM  Provider/Observer:     Edgardo Roys PSYD  Chief Complaint:      Chief Complaint  Patient presents with  . Agitation  . Pain  . Depression  . Anxiety    Reason For Service:     The patient is a 61 year old Caucasian male that presented to the emergency department in September 12, 2014 with left-sided weakness and slurred speech. MRI of the brain showed a large acute tertiary right MCA tertiary infarct involving the right frontal lobe as well as remote infarcts of the left medial occipital lobe and left cerebellar hemisphere.  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 The patient was treated in the inpatient rehabilitation unit and was discharged with follow-up care with physical medicine and Dr. Letta Pate.  The patient has continued to have some significant adjustment issues and difficulties. Psychosocial stressors related to the relationship between the patient is wife as well as his stepson created complicated situation for him to cope with along with his physical limitations of left side hemiparesis. The patient was referred for neuropsychological interventions and we'll initially work on building coping skills around the adjustment issues.  Interventions Strategy:  Cognitive Behavioral interventions and coping skills building for recovery form CVA  Participation Level:   Active  Participation Quality:  Appropriate and Attentive      Behavioral Observation:  Well Groomed, Alert, and Appropriate.   Current Psychosocial Factors: The patient reports along with wife that they have been doing better but it is still hard for her to get patient to do what he needs to do  for self.    Content of Session:   Reveiwed current symptoms and worked on Therapist, occupational  Current Status:   The patient reports that he has still had considerable gait issues and trouble coping post stroke.  The patient has been avoiding any activities to go outside.  Patient Progress:   Stable  Target Goals:   Working on Therapist, occupational due to medical issues following CVA  Last Reviewed:   04/22/2017  Goals Addressed Today:    Worked on coping and adaptive skills  Impression/Diagnosis:   The patient is status post MCA infarct of Right Hemisphere with near total loss of function in right arm and leg.  Patient denies significant change in cognitive function, but he was quite labile in his emotion that may or may not have an organic factor to it.  Will keep assessing that.  Diagnosis:   History of ischemic right MCA stroke  Spastic hemiplegia affecting nondominant side (HCC)  Gait disturbance, post-stroke  Adjustment disorder with depressed mood

## 2017-04-28 ENCOUNTER — Ambulatory Visit (HOSPITAL_BASED_OUTPATIENT_CLINIC_OR_DEPARTMENT_OTHER): Payer: PPO | Admitting: Physical Medicine & Rehabilitation

## 2017-04-28 ENCOUNTER — Encounter: Payer: Self-pay | Admitting: Physical Medicine & Rehabilitation

## 2017-04-28 VITALS — BP 113/74 | HR 72

## 2017-04-28 DIAGNOSIS — G8110 Spastic hemiplegia affecting unspecified side: Secondary | ICD-10-CM

## 2017-04-28 DIAGNOSIS — I63511 Cerebral infarction due to unspecified occlusion or stenosis of right middle cerebral artery: Secondary | ICD-10-CM | POA: Diagnosis not present

## 2017-04-28 MED ORDER — TRAMADOL HCL 50 MG PO TABS: ORAL_TABLET | ORAL | 3 refills | Status: AC

## 2017-04-28 NOTE — Patient Instructions (Signed)

## 2017-04-28 NOTE — Progress Notes (Signed)
Botox Injection for spasticity using needle EMG guidance  Dilution: 50 Units/ml Indication: Severe spasticity which interferes with ADL,mobility and/or  hygiene and is unresponsive to medication management and other conservative care Informed consent was obtained after describing risks and benefits of the procedure with the patient. This includes bleeding, bruising, infection, excessive weakness, or medication side effects. A REMS form is on file and signed. Needle: 25g 2" needle electrode Number of units per muscle FDS 50 FDP 50.  All injections were done after obtaining appropriate EMG activity and after negative drawback for blood. The patient tolerated the procedure well. Post procedure instructions were given. A followup appointment was made.   Due to the patient becoming less ambulatory, did not inject elbow flexors, his finger flexors do show increased tone and we discussed using his wrist hand orthosis as well.

## 2017-04-28 NOTE — Progress Notes (Signed)
Subjective:    Patient ID: Brian Hall, male    DOB: 03-17-1956, 61 y.o.   MRN: [id number]  HPI  Pain Inventory Average Pain 7 Pain Right Now 7 My pain is intermittent and aching  In the last 24 hours, has pain interfered with the following? General activity 7 Relation with others 7 Enjoyment of life 9 What TIME of day is your pain at its worst? evening Sleep (in general) Fair  Pain is worse with: walking, bending and standing Pain improves with: medication Relief from Meds: 8  Mobility ability to climb steps?  no do you drive?  no use a wheelchair  Function disabled: date disabled 2016 I need assistance with the following:  dressing, bathing, toileting, meal prep, household duties and shopping  Neuro/Psych weakness trouble walking depression  Prior Studies Any changes since last visit?  no  Physicians involved in your care Any changes since last visit?  no   Family History  Problem Relation Age of Onset  . Hypertension Mother   . Hypertension Father   . Stroke Father   . Stroke Sister   . Arthritis/Rheumatoid Brother    Social History   Social History  . Marital status: Married    Spouse name: N/A  . Number of children: 1  . Years of education: 48   Occupational History  . truck driver, retired    Social History Main Topics  . Smoking status: Never Smoker  . Smokeless tobacco: Never Used  . Alcohol use No  . Drug use: No  . Sexual activity: Not on file   Other Topics Concern  . Not on file   Social History Narrative   Married, 1 daughter   Right handed   Caffeine use - rare tea      Past Surgical History:  Procedure Laterality Date  . CARDIAC CATHETERIZATION    . NECK DISSECTION Right 2011   s/p resection of throat cancer with multiple nodes removed   Past Medical History:  Diagnosis Date  . Blurred vision   . Cardiac LV ejection fraction 10-20%   . Cardiogenic shock ([place])   . CHF (congestive heart failure) ([place])   .  Diabetes mellitus   . 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 Hyperkalemia   . Hyperlipemia   . Hypertension   . Hypothyroidism   . Joint pain   . Joint swelling   . Obesity   . Renal insufficiency   . SOB (shortness of breath)   . Stroke ([place]) 09/12/2014  . Throat cancer ([place]) 2011   s/p neck dissection, radiation, chemo   There were no vitals taken for this visit.  Opioid Risk Score:   Fall Risk Score:  `1  Depression screen PHQ 2/9  Depression screen [facility] 2/9 04/21/2017 03/19/2017 02/17/2017 01/06/2017 12/20/2016 12/06/2016 07/29/2016  Decreased Interest 2 2 3 3 1 2 2   Down, Depressed, Hopeless 2 2 2  0 1 1 1   PHQ - 2 Score 4 4 5 3 2 3 3   Altered sleeping 0 0 3 0 1 2 1   Tired, decreased energy 2 2 3  0 1 1 1   Change in appetite 2 2 1 1 1 1 1   Feeling bad or failure about yourself  2 2 3 1  0 1 1  Trouble concentrating 0 0 3 0 0 1 0  Moving slowly or fidgety/restless 3 3 2  0 1 0 0  Suicidal thoughts 0 0 2 0 0 1 1  PHQ-9 Score 13 13 22 5 6 10 8   Difficult doing work/chores - - - - - - -  Some recent data might be hidden     Review of Systems  Constitutional: Positive for appetite change and unexpected weight change.  HENT: Negative.   Eyes: Negative.   Respiratory: Negative.   Cardiovascular: Negative.   Gastrointestinal: Positive for constipation.  Endocrine: Negative.   Genitourinary: Negative.   Musculoskeletal: Negative.   Skin: Negative.   Allergic/Immunologic: Negative.   Neurological: Negative.   Hematological: Negative.   Psychiatric/Behavioral: Negative.   All other systems reviewed and are negative.      Objective:   Physical Exam        Assessment & Plan:

## 2017-05-12 ENCOUNTER — Other Ambulatory Visit: Payer: Self-pay | Admitting: Family Medicine

## 2017-05-21 ENCOUNTER — Other Ambulatory Visit: Payer: Self-pay | Admitting: Family Medicine

## 2017-05-30 ENCOUNTER — Encounter: Payer: Self-pay | Admitting: Psychology

## 2017-05-30 ENCOUNTER — Encounter: Payer: PPO | Attending: Physical Medicine & Rehabilitation | Admitting: Psychology

## 2017-05-30 DIAGNOSIS — F4321 Adjustment disorder with depressed mood: Secondary | ICD-10-CM | POA: Diagnosis not present

## 2017-05-30 DIAGNOSIS — G8110 Spastic hemiplegia affecting unspecified side: Secondary | ICD-10-CM | POA: Insufficient documentation

## 2017-05-30 DIAGNOSIS — I69398 Other sequelae of cerebral infarction: Secondary | ICD-10-CM | POA: Diagnosis not present

## 2017-05-30 DIAGNOSIS — R269 Unspecified abnormalities of gait and mobility: Secondary | ICD-10-CM

## 2017-05-30 DIAGNOSIS — I63511 Cerebral infarction due to unspecified occlusion or stenosis of right middle cerebral artery: Secondary | ICD-10-CM | POA: Insufficient documentation

## 2017-05-30 NOTE — Progress Notes (Signed)
Patient:  Brian Hall   DOB: 1955-10-03  MR Number: [id number]  Location: [place] [facility] [address] [place] [id number] [place] [address] Dept: [phone]  Start: 11 AMM End: 12 PM  Provider/Observer:     Edgardo Roys PSYD  Chief Complaint:      Chief Complaint  Patient presents with  . Agitation  . Anxiety  . Pain  . Stress    Reason For Service:     The patient is a 61 year old Caucasian male that presented to the emergency department in September 12, 2014 with left-sided weakness and slurred speech. MRI of the brain showed a large acute tertiary right MCA tertiary infarct involving the right frontal lobe as well as remote infarcts of the left medial occipital lobe and left cerebellar hemisphere.  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 The patient was treated in the inpatient rehabilitation unit and was discharged with follow-up care with physical medicine and Dr. Letta Pate.  The patient has continued to have some significant adjustment issues and difficulties. Psychosocial stressors related to the relationship between the patient is wife as well as his stepson created complicated situation for him to cope with along with his physical limitations of left side hemiparesis. The patient was referred for neuropsychological interventions and we'll initially work on building coping skills around the adjustment issues.  Interventions Strategy:  Cognitive Behavioral interventions and coping skills building for recovery form CVA  Participation Level:   Active  Participation Quality:  Appropriate and Attentive      Behavioral Observation:  Well Groomed, Alert, and Appropriate.   Current Psychosocial Factors: The patient and his wife report that they are doing better but the patient still has a fear/anger reaction when wife leaves house for extended  period of time.    Content of Session:   Reveiwed current symptoms and worked on Therapist, occupational  Current Status:   The patient reports that he has still had considerable gait issues and trouble coping post stroke.  The patient has been avoiding any activities to go outside.  The patient has been working on finding some times to leave house but he is in fear that something will happen.  Patient angry and tends to take it out on wife.  Patient Progress:   Stable  Target Goals:   Working on Therapist, occupational due to medical issues following CVA  Last Reviewed:   05/30/2017  Goals Addressed Today:    Worked on coping and adaptive skills  Impression/Diagnosis:   The patient is status post MCA infarct of Right Hemisphere with near total loss of function in right arm and leg.  Patient denies significant change in cognitive function, but he was quite labile in his emotion that may or may not have an organic factor to it.  Will keep assessing that.  Diagnosis:   Spastic hemiplegia affecting nondominant side (HCC)  Gait disturbance, post-stroke  Adjustment disorder with depressed mood

## 2017-06-05 ENCOUNTER — Other Ambulatory Visit: Payer: Self-pay | Admitting: *Deleted

## 2017-06-05 NOTE — Patient Outreach (Signed)
[facility]) Care Management  06/05/2017  WINFIELD CABA July 17, 1956 [id number]  Telephone screening call  Placed call to patient contact number, no answer able to leave a HIPAA compliant message requesting a return call  Plan Await return call if no response will plan outreach call in the next week.   Joylene Draft, RN, [place] Management Coordinator  [phone]- Mobile [phone]- Toll Free Main Office

## 2017-06-11 ENCOUNTER — Other Ambulatory Visit: Payer: Self-pay | Admitting: *Deleted

## 2017-06-11 NOTE — Patient Outreach (Signed)
[facility]) Care Management  06/11/2017  Brian Hall 1956-08-14 [id number]   Telephone screen call for health risk assesment #2 call attempt  Placed call to patient,no answer able to leave a HIPAA compliant message requesting a return call .  Plan Will await return call, if no response will attempt call in the next week .    Joylene Draft, RN, [place] Management Coordinator  [phone]- Mobile [phone]- Toll Free Main Office

## 2017-06-16 ENCOUNTER — Other Ambulatory Visit: Payer: Self-pay | Admitting: Family Medicine

## 2017-06-16 ENCOUNTER — Other Ambulatory Visit: Payer: Self-pay | Admitting: *Deleted

## 2017-06-16 NOTE — Patient Outreach (Signed)
[facility]) Care Management  06/16/2017  Brian Hall 11-Dec-1955 [id number]   3rd Unsuccessful attempt to contact patient for follow up on High risk assessment.  Plan Will await return call   Joylene Draft, RN, [place] Management Coordinator  [phone]- Mobile [phone]- Toll Free Main Office

## 2017-06-23 ENCOUNTER — Encounter: Payer: Self-pay | Admitting: Family Medicine

## 2017-06-23 ENCOUNTER — Ambulatory Visit (INDEPENDENT_AMBULATORY_CARE_PROVIDER_SITE_OTHER): Payer: PPO | Admitting: Family Medicine

## 2017-06-23 VITALS — Temp 97.2°F | Ht 71.0 in

## 2017-06-23 DIAGNOSIS — E782 Mixed hyperlipidemia: Secondary | ICD-10-CM | POA: Diagnosis not present

## 2017-06-23 DIAGNOSIS — L89899 Pressure ulcer of other site, unspecified stage: Secondary | ICD-10-CM

## 2017-06-23 DIAGNOSIS — E1142 Type 2 diabetes mellitus with diabetic polyneuropathy: Secondary | ICD-10-CM

## 2017-06-23 DIAGNOSIS — Z8673 Personal history of transient ischemic attack (TIA), and cerebral infarction without residual deficits: Secondary | ICD-10-CM | POA: Diagnosis not present

## 2017-06-23 LAB — CMP14+EGFR
ALT: 19 IU/L (ref 0–44)
AST: 14 IU/L (ref 0–40)
Albumin/Globulin Ratio: 1.3 (ref 1.2–2.2)
Albumin: 3.7 g/dL (ref 3.5–5.5)
Alkaline Phosphatase: 94 IU/L (ref 39–117)
BUN/Creatinine Ratio: 17 (ref 9–20)
BUN: 29 mg/dL — ABNORMAL HIGH (ref 6–24)
Bilirubin Total: 0.5 mg/dL (ref 0.0–1.2)
CO2: 19 mmol/L — ABNORMAL LOW (ref 20–29)
Calcium: 9.5 mg/dL (ref 8.7–10.2)
Chloride: 102 mmol/L (ref 96–106)
Creatinine, Ser: 1.71 mg/dL — ABNORMAL HIGH (ref 0.76–1.27)
GFR calc Af Amer: 50 mL/min/1.73 — ABNORMAL LOW
GFR calc non Af Amer: 43 mL/min/1.73 — ABNORMAL LOW
Globulin, Total: 2.9 g/dL (ref 1.5–4.5)
Glucose: 206 mg/dL — ABNORMAL HIGH (ref 65–99)
Potassium: 4.9 mmol/L (ref 3.5–5.2)
Sodium: 139 mmol/L (ref 134–144)
Total Protein: 6.6 g/dL (ref 6.0–8.5)

## 2017-06-23 LAB — CBC WITH DIFFERENTIAL/PLATELET
Basophils Absolute: 0 x10E3/uL (ref 0.0–0.2)
Basos: 0 %
EOS (ABSOLUTE): 0.2 x10E3/uL (ref 0.0–0.4)
Eos: 2 %
Hematocrit: 41.3 % (ref 37.5–51.0)
Hemoglobin: 13.9 g/dL (ref 13.0–17.7)
Immature Grans (Abs): 0 x10E3/uL (ref 0.0–0.1)
Immature Granulocytes: 0 %
Lymphocytes Absolute: 1.4 x10E3/uL (ref 0.7–3.1)
Lymphs: 14 %
MCH: 30.2 pg (ref 26.6–33.0)
MCHC: 33.7 g/dL (ref 31.5–35.7)
MCV: 90 fL (ref 79–97)
Monocytes Absolute: 0.6 x10E3/uL (ref 0.1–0.9)
Monocytes: 6 %
Neutrophils Absolute: 7.8 x10E3/uL — ABNORMAL HIGH (ref 1.4–7.0)
Neutrophils: 78 %
Platelets: 239 x10E3/uL (ref 150–379)
RBC: 4.61 x10E6/uL (ref 4.14–5.80)
RDW: 15.3 % (ref 12.3–15.4)
WBC: 10.1 x10E3/uL (ref 3.4–10.8)

## 2017-06-23 LAB — BAYER DCA HB A1C WAIVED: HB A1C (BAYER DCA - WAIVED): 6.5 %

## 2017-06-23 MED ORDER — AMLODIPINE BESYLATE 5 MG PO TABS: ORAL_TABLET | ORAL | 2 refills | Status: DC

## 2017-06-23 MED ORDER — ROSUVASTATIN CALCIUM 20 MG PO TABS: 20.0000 mg | ORAL_TABLET | Freq: Every day | ORAL | 2 refills | Status: AC

## 2017-06-23 MED ORDER — ARIPIPRAZOLE 15 MG PO TABS: 15.0000 mg | ORAL_TABLET | Freq: Every day | ORAL | 2 refills | Status: AC

## 2017-06-23 MED ORDER — LEVOTHYROXINE SODIUM 100 MCG PO TABS: 100.0000 ug | ORAL_TABLET | Freq: Every day | ORAL | 2 refills | Status: DC

## 2017-06-23 MED ORDER — OMEPRAZOLE 40 MG PO CPDR: 40.0000 mg | DELAYED_RELEASE_CAPSULE | Freq: Every day | ORAL | 2 refills | Status: DC

## 2017-06-23 MED ORDER — CARVEDILOL 25 MG PO TABS: 25.0000 mg | ORAL_TABLET | Freq: Two times a day (BID) | ORAL | 0 refills | Status: AC

## 2017-06-23 MED ORDER — LISINOPRIL 40 MG PO TABS: 40.0000 mg | ORAL_TABLET | Freq: Every day | ORAL | 2 refills | Status: AC

## 2017-06-23 MED ORDER — METFORMIN HCL ER (MOD) 1000 MG PO TB24: 1000.0000 mg | ORAL_TABLET | Freq: Every day | ORAL | 2 refills | Status: AC

## 2017-06-23 MED ORDER — BUPROPION HCL ER (XL) 150 MG PO TB24: ORAL_TABLET | ORAL | 2 refills | Status: AC

## 2017-06-23 MED ORDER — CLOPIDOGREL BISULFATE 75 MG PO TABS: 75.0000 mg | ORAL_TABLET | Freq: Every day | ORAL | 2 refills | Status: AC

## 2017-06-23 NOTE — Patient Instructions (Signed)
Drink 1 can Ensure 3 times a day.

## 2017-06-23 NOTE — Progress Notes (Signed)
Subjective:  Patient ID: Brian Hall, male    DOB: [birth year]  Age: 61 y.o. MRN: [id number]  CC: Hypertension (pt here today for routine follow up of chronic medical conditions)   HPI Brian Hall presents forFollow-up of diabetes. Patient checks blood sugar at home. No readings have been high or low recently most in the mid 100s. His wife tells me he is eating very little these days. He just eats what he wants. However he rarely eats a full meal. He will skip meals entirely frequently. Patient tells me that he has no energy or strength. Patient denies symptoms such as polyuria, polydipsia, excessive hunger, nausea No significant hypoglycemic spells noted. Medications reviewed. Pt reports taking them regularly without complication/adverse reaction being reported today.    History Taz has a past medical history of Blurred vision; Cardiac LV ejection fraction 10-20%; Cardiogenic shock ([place]); CHF (congestive heart failure) ([place]); Diabetes mellitus; History of ischemic right MCA stroke (05/26/2015); Hyperkalemia; Hyperlipemia; Hypertension; Hypothyroidism; Joint pain; Joint swelling; Obesity; Renal insufficiency; SOB (shortness of breath); Stroke [facility]) (09/12/2014); and Throat cancer ([place]) (2011).   He has a past surgical history that includes Cardiac catheterization and Neck dissection (Right, 2011).   His family history includes Arthritis/Rheumatoid in his brother; Hypertension in his father and mother; Stroke in his father and sister.He reports that he has never smoked. He has never used smokeless tobacco. He reports that he does not drink alcohol or use drugs.  Current Outpatient Prescriptions on File Prior to Visit  Medication Sig Dispense Refill  . cholecalciferol (VITAMIN D) 400 UNITS TABS tablet Take 400 Units by mouth daily.    Marland Kitchen gabapentin (NEURONTIN) 300 MG capsule take 1 capsule by mouth three times a day 90 capsule 3  . mometasone (ELOCON) 0.1 % cream Apply 1 application  topically daily. To affected areas 45 g 5  . Multiple Vitamins-Minerals (MULTIVITAMIN WITH MINERALS) tablet Take 1 tablet by mouth daily.    . Naproxen Sodium (ALEVE) 220 MG CAPS Take 2 capsules by mouth 2 (two) times daily.    Marland Kitchen tiZANidine (ZANAFLEX) 2 MG tablet Take 1 tablet (2 mg total) by mouth 3 (three) times daily. 90 tablet 2  . traMADol (ULTRAM) 50 MG tablet take 1 tablet by mouth every 6 hours if needed for MODERATE PAIN 60 tablet 3   No current facility-administered medications on file prior to visit.     ROS Review of Systems  Constitutional: Negative for chills, diaphoresis, fever and unexpected weight change.  HENT: Negative for congestion, hearing loss, rhinorrhea and sore throat.   Eyes: Negative for visual disturbance.  Respiratory: Negative for cough and shortness of breath.   Cardiovascular: Negative for chest pain.  Gastrointestinal: Negative for abdominal pain, constipation and diarrhea.  Genitourinary: Negative for dysuria and flank pain.  Musculoskeletal: Negative for arthralgias and joint swelling.  Skin: Negative for rash.  Neurological: Negative for dizziness and headaches.  Psychiatric/Behavioral: Negative for dysphoric mood and sleep disturbance.    Objective:  Temp (!) 97.2 F (36.2 C) (Oral)   Ht _0  (1.803 m)   BP Readings from Last 3 Encounters:  04/28/17 113/74  04/21/17 106/68  03/19/17 100/65    Wt Readings from Last 3 Encounters:  04/21/17 233 lb (105.7 kg)  03/19/17 238 lb (108 kg)  02/17/17 246 lb (111.6 kg)     Physical Exam  Constitutional: He is oriented to person, place, and time. He appears well-developed and well-nourished. No distress.  HENT:  Head:  Normocephalic and atraumatic.  Right Ear: External ear normal.  Left Ear: External ear normal.  Nose: Nose normal.  Mouth/Throat: Oropharynx is clear and moist.  Eyes: Pupils are equal, round, and reactive to light. Conjunctivae and EOM are normal.  Neck: Normal range of  motion. Neck supple. No thyromegaly present.  Cardiovascular: Normal rate, regular rhythm and normal heart sounds.   No murmur heard. Pulmonary/Chest: Effort normal and breath sounds normal. No respiratory distress. He has no wheezes. He has no rales.  Abdominal: Soft. Bowel sounds are normal. He exhibits no distension. There is no tenderness.  Musculoskeletal:  Left hemiplegia, WC Bound  Lymphadenopathy:    He has no cervical adenopathy.  Neurological: He is alert and oriented to person, place, and time. He has normal reflexes. No cranial nerve deficit. He exhibits abnormal muscle tone. Coordination abnormal.  Skin: Skin is warm and dry.  Psychiatric: He has a normal mood and affect. His behavior is normal. Judgment and thought content normal.    No components found for: BAYER     Assessment & Plan:   Brian Hall was seen today for hypertension.  Diagnoses and all orders for this visit:  Type 2 diabetes mellitus with peripheral neuropathy ([place]) -     Microalbumin / creatinine urine ratio -     Bayer DCA Hb A1c Waived -     Face-to-face encounter (required for Medicare/Medicaid patients)  Mixed hyperlipidemia -     CBC with Differential/Platelet -     CMP14+EGFR  History of ischemic right MCA stroke -     Ambulatory referral to [place] -     Face-to-face encounter (required for Medicare/Medicaid patients)  Pressure injury of skin of other site, unspecified injury stage -     Ambulatory referral to [place] -     Face-to-face encounter (required for Medicare/Medicaid patients)  Other orders -     amLODipine (NORVASC) 5 MG tablet; take 1 tablet by mouth once daily for BLOOD PRESSURE -     ARIPiprazole (ABILIFY) 15 MG tablet; Take 1 tablet (15 mg total) by mouth daily. -     buPROPion (WELLBUTRIN XL) 150 MG 24 hr tablet; TAKE 2 TABLETS DAILY -     carvedilol (COREG) 25 MG tablet; Take 1 tablet (25 mg total) by mouth 2 (two) times daily. -     clopidogrel (PLAVIX) 75 MG  tablet; Take 1 tablet (75 mg total) by mouth daily. -     levothyroxine (SYNTHROID, LEVOTHROID) 100 MCG tablet; Take 1 tablet (100 mcg total) by mouth daily before breakfast. -     lisinopril (PRINIVIL,ZESTRIL) 40 MG tablet; Take 1 tablet (40 mg total) by mouth daily. -     metFORMIN (GLUMETZA) 1000 MG (MOD) 24 hr tablet; Take 1 tablet (1,000 mg total) by mouth daily with breakfast. -     omeprazole (PRILOSEC) 40 MG capsule; Take 1 capsule (40 mg total) by mouth daily. -     rosuvastatin (CRESTOR) 20 MG tablet; Take 1 tablet (20 mg total) by mouth daily.      I have changed Mr. Vowels ARIPiprazole, buPROPion, carvedilol, clopidogrel, levothyroxine, lisinopril, omeprazole, and rosuvastatin. I am also having him maintain his multivitamin with minerals, cholecalciferol, Naproxen Sodium, gabapentin, tiZANidine, mometasone, traMADol, amLODipine, and metFORMIN.  Meds ordered this encounter  Medications  . amLODipine (NORVASC) 5 MG tablet    Sig: take 1 tablet by mouth once daily for BLOOD PRESSURE    Dispense:  30 tablet    Refill:  2  . ARIPiprazole (ABILIFY) 15 MG tablet    Sig: Take 1 tablet (15 mg total) by mouth daily.    Dispense:  30 tablet    Refill:  2  . buPROPion (WELLBUTRIN XL) 150 MG 24 hr tablet    Sig: TAKE 2 TABLETS DAILY    Dispense:  60 tablet    Refill:  2  . carvedilol (COREG) 25 MG tablet    Sig: Take 1 tablet (25 mg total) by mouth 2 (two) times daily.    Dispense:  180 tablet    Refill:  0  . clopidogrel (PLAVIX) 75 MG tablet    Sig: Take 1 tablet (75 mg total) by mouth daily.    Dispense:  30 tablet    Refill:  2  . levothyroxine (SYNTHROID, LEVOTHROID) 100 MCG tablet    Sig: Take 1 tablet (100 mcg total) by mouth daily before breakfast.    Dispense:  30 tablet    Refill:  2  . lisinopril (PRINIVIL,ZESTRIL) 40 MG tablet    Sig: Take 1 tablet (40 mg total) by mouth daily.    Dispense:  30 tablet    Refill:  2  . metFORMIN (GLUMETZA) 1000 MG (MOD) 24 hr  tablet    Sig: Take 1 tablet (1,000 mg total) by mouth daily with breakfast.    Dispense:  30 tablet    Refill:  2  . omeprazole (PRILOSEC) 40 MG capsule    Sig: Take 1 capsule (40 mg total) by mouth daily.    Dispense:  30 capsule    Refill:  2  . rosuvastatin (CRESTOR) 20 MG tablet    Sig: Take 1 tablet (20 mg total) by mouth daily.    Dispense:  30 tablet    Refill:  2     Follow-up: Return in about 3 months (around 09/23/2017).  Claretta Fraise, M.D.

## 2017-06-25 ENCOUNTER — Encounter: Payer: Self-pay | Admitting: *Deleted

## 2017-06-27 ENCOUNTER — Other Ambulatory Visit: Payer: PPO

## 2017-06-27 DIAGNOSIS — E1142 Type 2 diabetes mellitus with diabetic polyneuropathy: Secondary | ICD-10-CM | POA: Diagnosis not present

## 2017-06-28 LAB — MICROALBUMIN / CREATININE URINE RATIO
Creatinine, Urine: 130.1 mg/dL
MICROALBUM., U, RANDOM: 468.1 ug/mL
Microalb/Creat Ratio: 359.8 mg/g creat — ABNORMAL HIGH (ref 0.0–30.0)

## 2017-07-11 ENCOUNTER — Other Ambulatory Visit: Payer: Self-pay | Admitting: Family Medicine

## 2017-07-21 ENCOUNTER — Ambulatory Visit: Payer: BLUE CROSS/BLUE SHIELD | Admitting: Neurology

## 2017-07-22 ENCOUNTER — Telehealth: Payer: Self-pay | Admitting: Family Medicine

## 2017-07-22 NOTE — Telephone Encounter (Signed)
Please contact EMS to transport to E.D. To evaluate

## 2017-07-22 NOTE — Telephone Encounter (Signed)
Spoke with patient's wife.  She is very concerned because he is only urinating once per day and going 24-29 hours in between times.  She is also worried about the pressure sore on his sacrum - she reports home health did contact her but she thought it was for PT only so she told them he did not need that.  I have contacted Tommi Rumps at [facility] to find out more info.  Please advise what she should do about the decreased output.  She has tried to get him to go to the hospital to be evaluated but he refuses.

## 2017-07-22 NOTE — Telephone Encounter (Signed)
Patient's wife is aware 

## 2017-07-28 ENCOUNTER — Ambulatory Visit: Payer: PPO | Admitting: Psychology

## 2017-07-29 ENCOUNTER — Ambulatory Visit: Payer: PPO | Admitting: Physical Medicine & Rehabilitation

## 2017-07-31 DIAGNOSIS — L89312 Pressure ulcer of right buttock, stage 2: Secondary | ICD-10-CM | POA: Diagnosis not present

## 2017-07-31 DIAGNOSIS — I69354 Hemiplegia and hemiparesis following cerebral infarction affecting left non-dominant side: Secondary | ICD-10-CM | POA: Diagnosis not present

## 2017-07-31 DIAGNOSIS — I509 Heart failure, unspecified: Secondary | ICD-10-CM | POA: Diagnosis not present

## 2017-07-31 DIAGNOSIS — I110 Hypertensive heart disease with heart failure: Secondary | ICD-10-CM | POA: Diagnosis not present

## 2017-07-31 DIAGNOSIS — L89322 Pressure ulcer of left buttock, stage 2: Secondary | ICD-10-CM | POA: Diagnosis not present

## 2017-08-04 ENCOUNTER — Telehealth: Payer: Self-pay | Admitting: Physical Medicine & Rehabilitation

## 2017-08-04 DIAGNOSIS — I69354 Hemiplegia and hemiparesis following cerebral infarction affecting left non-dominant side: Secondary | ICD-10-CM | POA: Diagnosis not present

## 2017-08-04 DIAGNOSIS — I110 Hypertensive heart disease with heart failure: Secondary | ICD-10-CM | POA: Diagnosis not present

## 2017-08-04 DIAGNOSIS — L89322 Pressure ulcer of left buttock, stage 2: Secondary | ICD-10-CM | POA: Diagnosis not present

## 2017-08-04 DIAGNOSIS — L89312 Pressure ulcer of right buttock, stage 2: Secondary | ICD-10-CM | POA: Diagnosis not present

## 2017-08-04 DIAGNOSIS — I509 Heart failure, unspecified: Secondary | ICD-10-CM | POA: Diagnosis not present

## 2017-08-04 NOTE — Telephone Encounter (Signed)
Pt's spouse phoned to state that she wants to cancel the future apt for Botox. She stated he is "basically homebound now." She stated home health comes now. Please advise she would like to know if there is any other reason you wished to see the pt other than Botox. She stated she is unable to transport him by herself. She wanted your input before she cancels the upcoming apt for Botox.

## 2017-08-04 NOTE — Telephone Encounter (Signed)
No reason unless he develops a new pain problem

## 2017-08-04 NOTE — Telephone Encounter (Signed)
I spoke to [name], patients wife and she is going to call and cancel his appointment for Thursday (08/07/17).

## 2017-08-07 ENCOUNTER — Ambulatory Visit: Payer: PPO | Admitting: Physical Medicine & Rehabilitation

## 2017-08-07 ENCOUNTER — Ambulatory Visit: Payer: BLUE CROSS/BLUE SHIELD | Admitting: Nurse Practitioner

## 2017-08-12 ENCOUNTER — Other Ambulatory Visit: Payer: Self-pay | Admitting: Physical Medicine & Rehabilitation

## 2017-08-18 DIAGNOSIS — I110 Hypertensive heart disease with heart failure: Secondary | ICD-10-CM | POA: Diagnosis not present

## 2017-08-18 DIAGNOSIS — I69354 Hemiplegia and hemiparesis following cerebral infarction affecting left non-dominant side: Secondary | ICD-10-CM | POA: Diagnosis not present

## 2017-08-18 DIAGNOSIS — I509 Heart failure, unspecified: Secondary | ICD-10-CM | POA: Diagnosis not present

## 2017-08-18 DIAGNOSIS — L89312 Pressure ulcer of right buttock, stage 2: Secondary | ICD-10-CM | POA: Diagnosis not present

## 2017-08-18 DIAGNOSIS — L89322 Pressure ulcer of left buttock, stage 2: Secondary | ICD-10-CM | POA: Diagnosis not present

## 2017-08-20 ENCOUNTER — Ambulatory Visit (INDEPENDENT_AMBULATORY_CARE_PROVIDER_SITE_OTHER): Payer: PPO

## 2017-08-20 DIAGNOSIS — Z7902 Long term (current) use of antithrombotics/antiplatelets: Secondary | ICD-10-CM

## 2017-08-20 DIAGNOSIS — I69354 Hemiplegia and hemiparesis following cerebral infarction affecting left non-dominant side: Secondary | ICD-10-CM

## 2017-08-20 DIAGNOSIS — I2510 Atherosclerotic heart disease of native coronary artery without angina pectoris: Secondary | ICD-10-CM | POA: Diagnosis not present

## 2017-08-20 DIAGNOSIS — L89322 Pressure ulcer of left buttock, stage 2: Secondary | ICD-10-CM

## 2017-08-20 DIAGNOSIS — E1142 Type 2 diabetes mellitus with diabetic polyneuropathy: Secondary | ICD-10-CM | POA: Diagnosis not present

## 2017-08-20 DIAGNOSIS — I110 Hypertensive heart disease with heart failure: Secondary | ICD-10-CM | POA: Diagnosis not present

## 2017-08-20 DIAGNOSIS — L89312 Pressure ulcer of right buttock, stage 2: Secondary | ICD-10-CM

## 2017-08-20 DIAGNOSIS — I509 Heart failure, unspecified: Secondary | ICD-10-CM | POA: Diagnosis not present

## 2017-08-22 ENCOUNTER — Telehealth: Payer: Self-pay | Admitting: *Deleted

## 2017-08-22 NOTE — Telephone Encounter (Signed)
Brian Hall with [facility] Would like to get order for social worker to see if she can get help Pt not eating but one meal a day, not voiding but about 600 cc daily, a BM every 7-10 days.  Would like to see if they can get order for hospital bed as well

## 2017-08-22 NOTE — Telephone Encounter (Signed)
Please  write and I will sign. Thanks, WS 

## 2017-08-28 DIAGNOSIS — L89312 Pressure ulcer of right buttock, stage 2: Secondary | ICD-10-CM | POA: Diagnosis not present

## 2017-08-28 DIAGNOSIS — I110 Hypertensive heart disease with heart failure: Secondary | ICD-10-CM | POA: Diagnosis not present

## 2017-08-28 DIAGNOSIS — I509 Heart failure, unspecified: Secondary | ICD-10-CM | POA: Diagnosis not present

## 2017-08-28 DIAGNOSIS — I69354 Hemiplegia and hemiparesis following cerebral infarction affecting left non-dominant side: Secondary | ICD-10-CM | POA: Diagnosis not present

## 2017-08-28 DIAGNOSIS — L89322 Pressure ulcer of left buttock, stage 2: Secondary | ICD-10-CM | POA: Diagnosis not present

## 2017-09-03 ENCOUNTER — Telehealth: Payer: Self-pay | Admitting: Family Medicine

## 2017-09-04 NOTE — Telephone Encounter (Signed)
Wife aware order placed on Dr. Livia Snellen desk to be signed

## 2017-09-05 ENCOUNTER — Other Ambulatory Visit: Payer: Self-pay

## 2017-09-05 NOTE — Patient Outreach (Signed)
[facility]) Care Management  09/05/2017  Brian Hall [birth year] [id number]   Telephone call to patient for referral from [facility].  No answer.  HIPAA compliant voice message left.  Plan: RN CM will attempt patient again within 3 business days.   Jone Baseman, RN, MSN [facility] Care Management Care Management Coordinator Direct Line [phone] Toll Free: [phone]  Fax: [phone]

## 2017-09-05 NOTE — Telephone Encounter (Signed)
Wife aware order faxed to [facility]

## 2017-09-08 ENCOUNTER — Telehealth: Payer: Self-pay | Admitting: Family Medicine

## 2017-09-08 ENCOUNTER — Other Ambulatory Visit: Payer: Self-pay

## 2017-09-08 NOTE — Patient Outreach (Signed)
[place]) Care Management  09/08/2017  XAYNE BRUMBAUGH August 27, 1956 [id number]   Incoming call from [place] at Dr. Livia Snellen office. Discussed with Tye Maryland telephone conversation with wife today and recommendation for a hospice referral due to recent rapid decline.  Tye Maryland states that she would send it back to Dr. Livia Snellen and follow up with wife.    Plan: RN CM will sign off case at this time and notify care management assistant of case status.    Jone Baseman, RN, MSN [facility] Care Management Care Management Coordinator Direct Line [phone] Toll Free: [phone]  Fax: [phone]

## 2017-09-08 NOTE — Patient Outreach (Signed)
[place] [facility]) Care Management  09/08/2017  Brian Hall 11-09-1955 [id number]   Telephone call to physician office for referral follow up. Message left with receptionist for follow up.  Plan: RN CM will wait return phone call.  Jone Baseman, RN, MSN [facility] Care Management Care Management Coordinator Direct Line [phone] Toll Free: [phone]  Fax: [phone]

## 2017-09-08 NOTE — Telephone Encounter (Signed)
Please advise of possible SNF or Hospice placement Pt has lost a lot of weight, has multiple bed sores

## 2017-09-08 NOTE — Telephone Encounter (Signed)
Please have hospice evaluate the patient

## 2017-09-08 NOTE — Patient Outreach (Signed)
Brian Hall) Care Management  09/08/2017  Brian Hall November 08, 1955 [id number]   Referral Date: 09-05-17 Referral Source: [facility] Referral Reason: Long term planning   Outreach Attempt: #2 spoke with wife who is DPR.  She is able to verify HIPAA.  She states that patient has had a rapid decline.  She reports some weight loss and that patient has bedsores.  She states that they are waiting for authorization from the home health company for more home health along with an aide.  She states that patient is total care and she cannot care for the patient properly.  She states they are waiting for a hospital bed.  She states that patient refused to go to the facility after the social worker from [facility] came in.  She states they cannot afford someone to come into care for him.  She states that patient has lost weight and he was about 280 lbs originally and now he is less than 190 lbs.  She reports he has an appointment with the physician on 09-23-17 and she does not know how she can get him there.  Discussed with wife [name] Care Management services.  She declined and felt like home health had done all that can be done at this point. We discussed possible hospice referral for patient.  CM talked about how patient could benefit from hospice services at this time.  Also discussed medicaid she states that does not qualify as he has too many assets.     Social: Patient lives in the home with wife who is his primary caregiver.     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  Patient has diabetes and htn.  Wife reports she sugars have been good with most recent sugar being.  She states patient may eat a meal once a day.  Patient is total care per wife and wear depends for incontinence.   Medications: Patient takes his medications as ordered.  Wife administers medications.  No problems with affording medications.    Appointments: Patient has appointment with primary  care on 09-23-17   Advanced Directives: Patient has refused advanced directives per wife.   Consent: Wife has declined services at this time.    Plan: RN CM will contact physician office    Brian Baseman, RN, MSN [facility] Care Management Coordinator Direct Line [phone] Toll Free: [phone]  Fax: [phone]

## 2017-09-09 ENCOUNTER — Other Ambulatory Visit: Payer: Self-pay

## 2017-09-09 ENCOUNTER — Encounter: Payer: Self-pay | Admitting: Family Medicine

## 2017-09-09 NOTE — Patient Outreach (Signed)
[facility]) Care Management  09/09/2017  MONTEZ CUDA [birth year] [id number]   Return telephone call to Jerene Canny from [place].  Discussed patient situation. She was able to review my note.   She states there are lots of family dynamics that need to be handled legally.   She feels patient is not Hospice appropriate at this time but feels an evaluation would be good.  She appreciates the input and recommendations.   Jone Baseman, RN, MSN [facility] Care Management Care Management Coordinator Direct Line [phone] Toll Free: [phone]  Fax: [phone]

## 2017-09-09 NOTE — Telephone Encounter (Signed)
Information sent to [place]

## 2017-09-10 ENCOUNTER — Other Ambulatory Visit: Payer: Self-pay | Admitting: Family Medicine

## 2017-09-13 ENCOUNTER — Other Ambulatory Visit: Payer: Self-pay | Admitting: Family Medicine

## 2017-09-23 ENCOUNTER — Ambulatory Visit: Payer: PPO | Admitting: Family Medicine

## 2017-10-03 DEATH — deceased

## 2018-07-19 IMAGING — CR DG HIP (WITH OR WITHOUT PELVIS) 2-3V*L*
3 series · 3 of 3 positions shown · non-contrast
Comparison: None.

CLINICAL DATA: Leg pain following physical therapy, initial
encounter

EXAM:
DG HIP (WITH OR WITHOUT PELVIS) 2-3V LEFT

[pelvis ap]
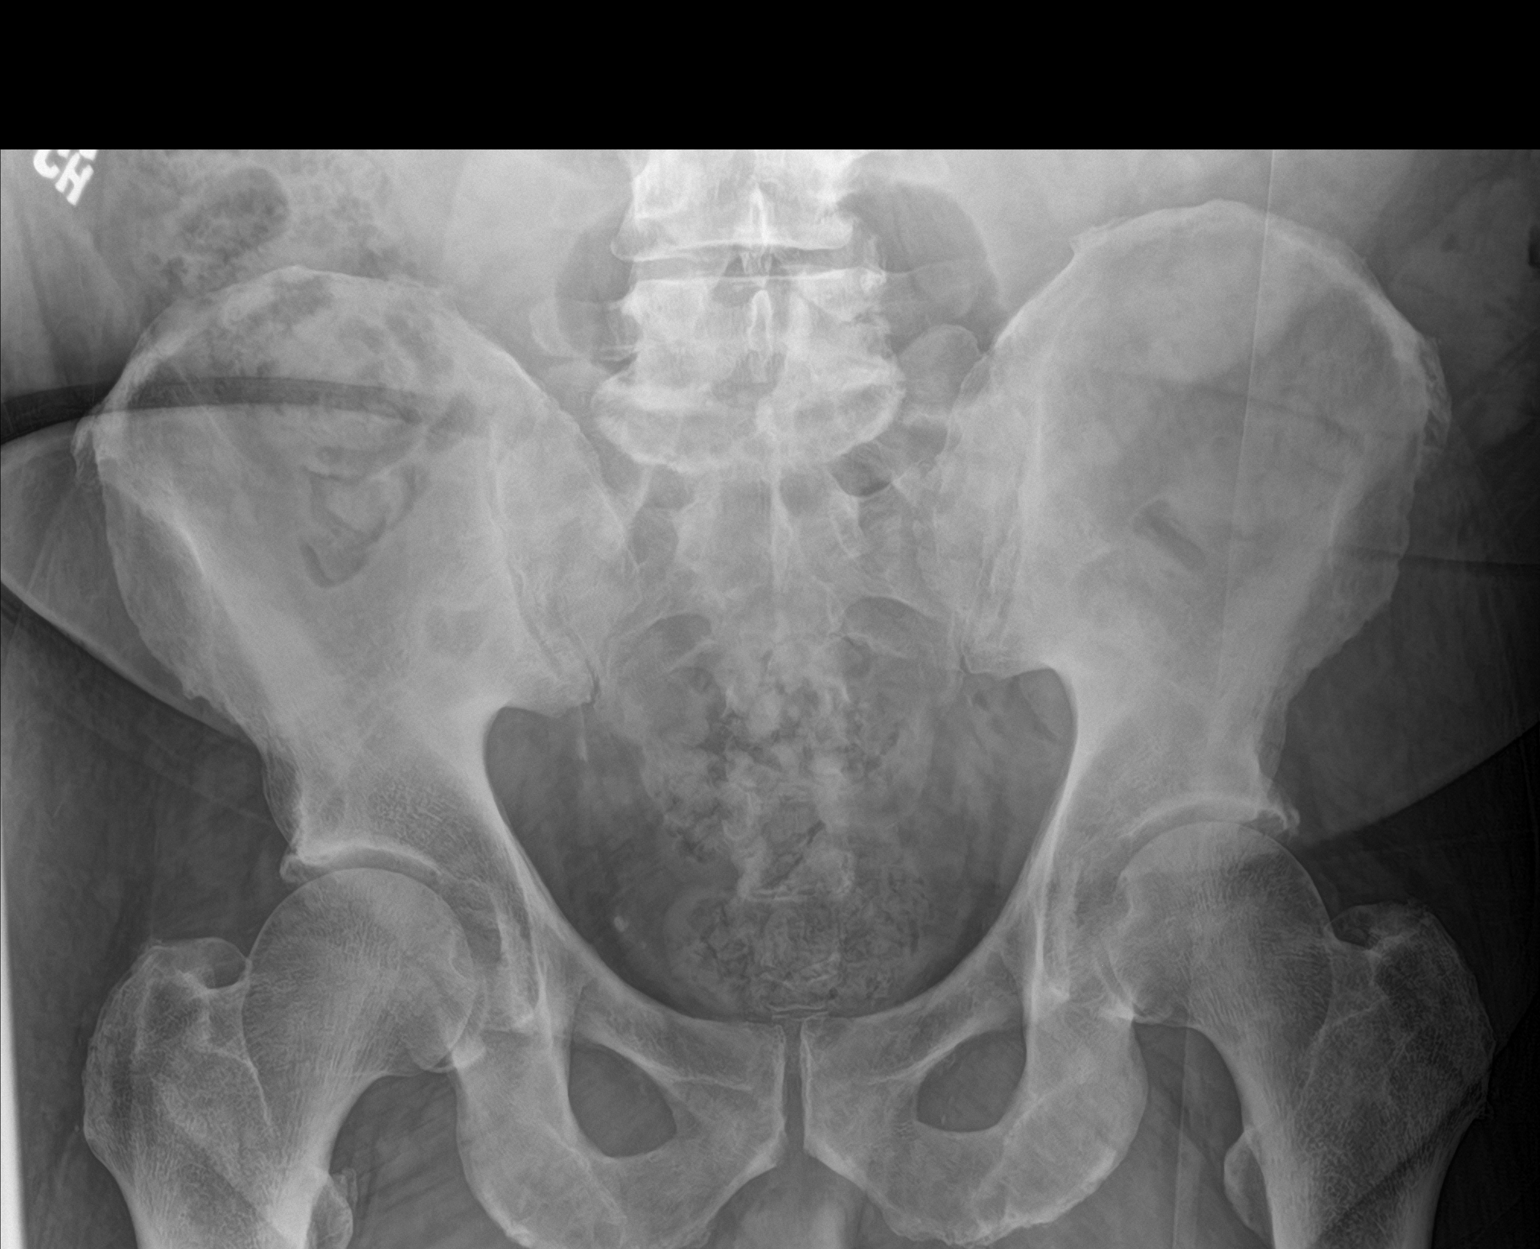

[hip ap]
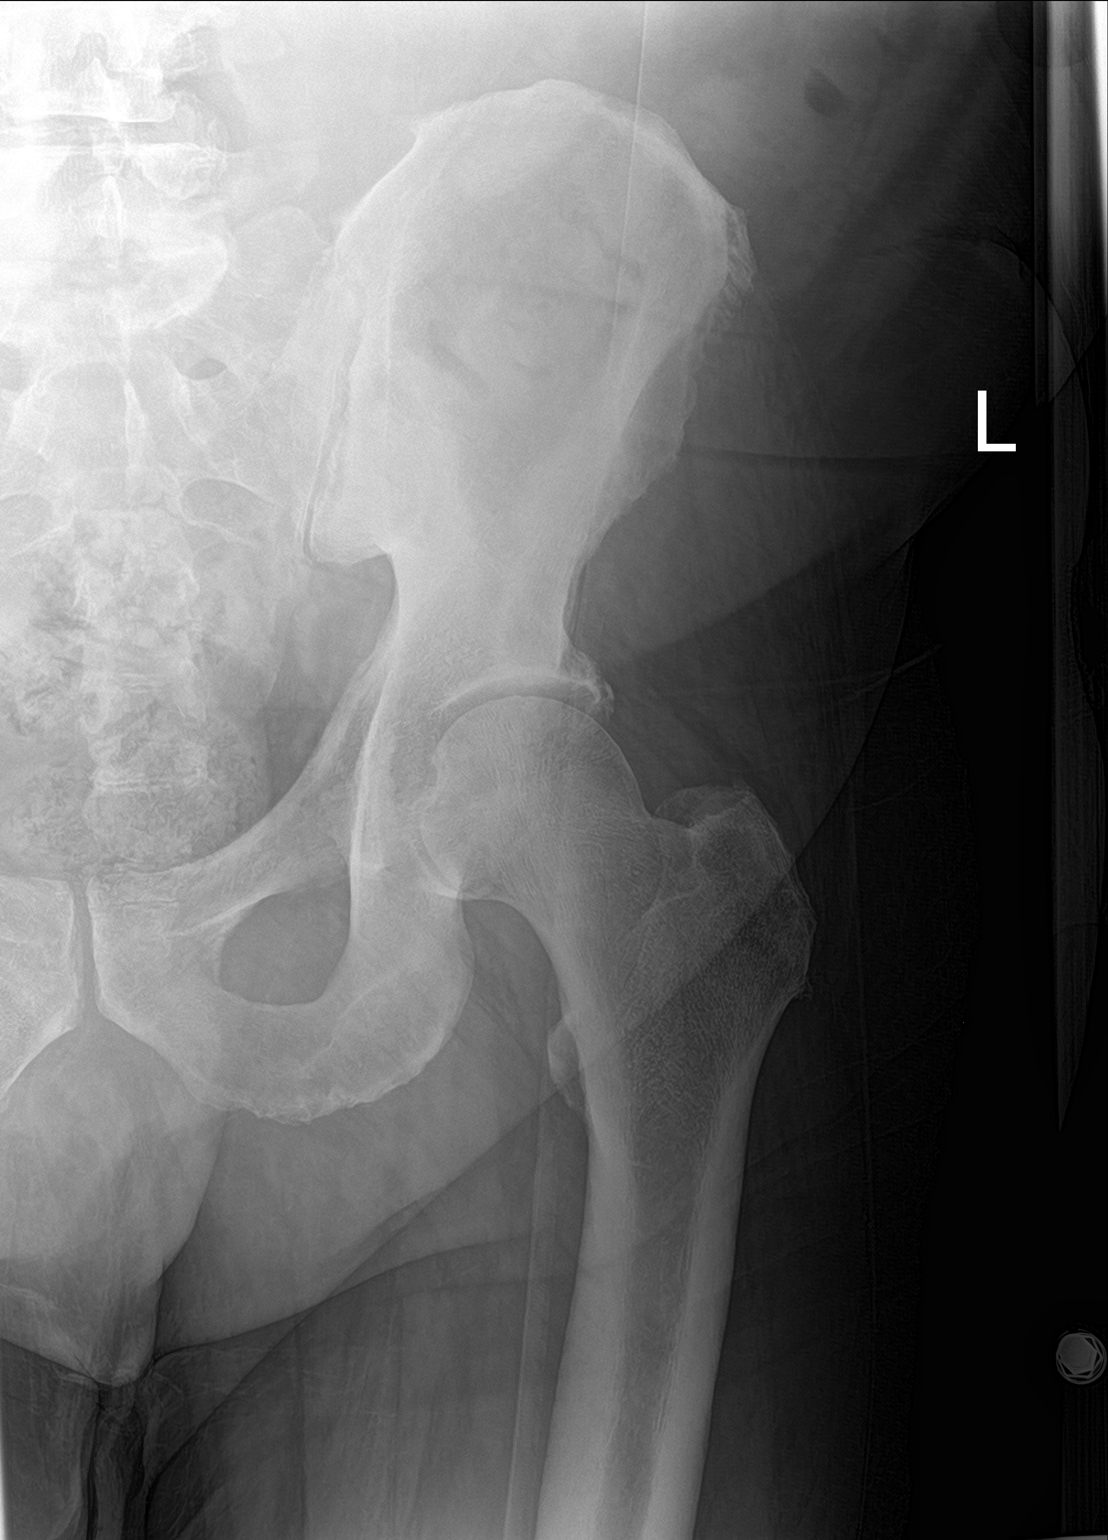

[hip lat]
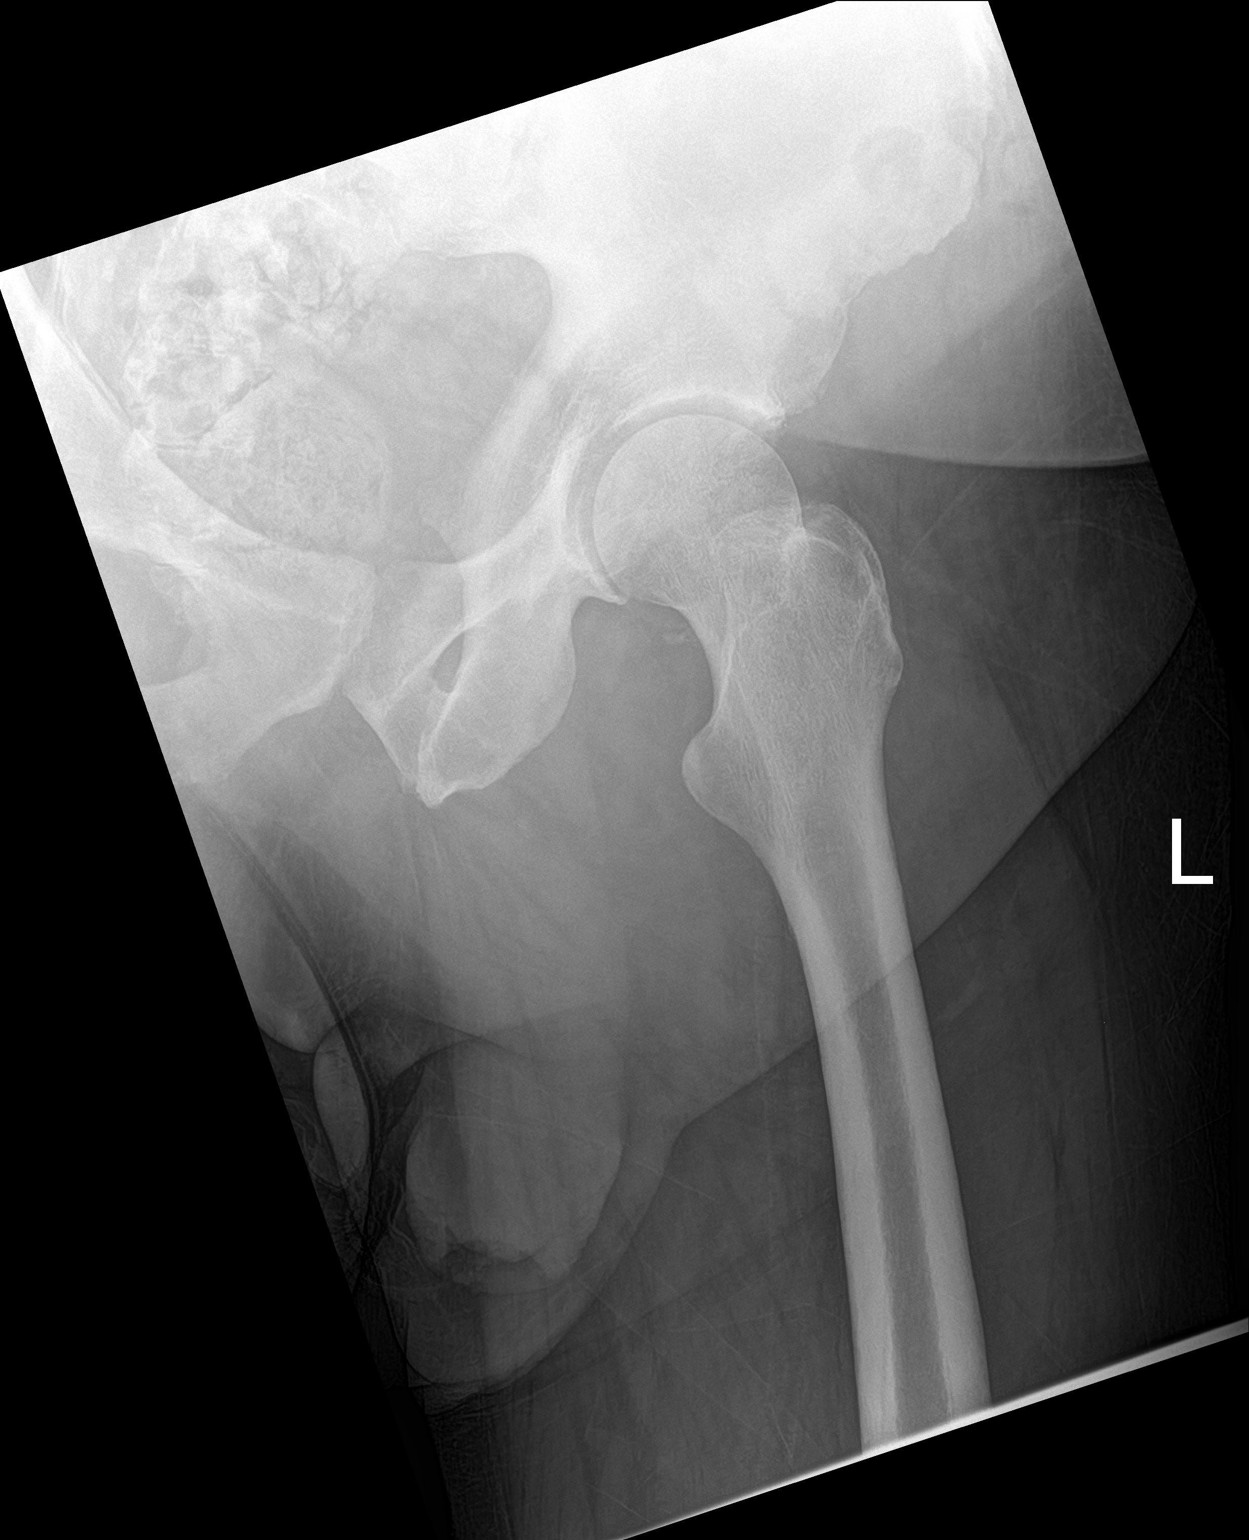

[3 of 3 positions shown; findings below may reference images not displayed]

FINDINGS: Degenerative changes of lumbar spine are noted. Pelvic ring is
intact. Mild degenerative changes of the hip joints are seen. No
acute fracture or dislocation is noted. No soft tissue changes are
noted.
IMPRESSION: No acute abnormality seen.
